# Patient Record
Sex: Female | Born: 1935
Health system: Southern US, Community
[De-identification: ages and names within clinical notes are randomized; demographics above are authoritative.]

## PROBLEM LIST (undated history)

## (undated) DIAGNOSIS — F32A Depression, unspecified: Secondary | ICD-10-CM

## (undated) DIAGNOSIS — F0280 Dementia in other diseases classified elsewhere without behavioral disturbance: Secondary | ICD-10-CM

## (undated) DIAGNOSIS — N183 Chronic kidney disease, stage 3 unspecified: Secondary | ICD-10-CM

## (undated) DIAGNOSIS — R5382 Chronic fatigue, unspecified: Secondary | ICD-10-CM

## (undated) DIAGNOSIS — I10 Essential (primary) hypertension: Secondary | ICD-10-CM

## (undated) DIAGNOSIS — H3530 Unspecified macular degeneration: Secondary | ICD-10-CM

## (undated) DIAGNOSIS — N2889 Other specified disorders of kidney and ureter: Secondary | ICD-10-CM

## (undated) DIAGNOSIS — D649 Anemia, unspecified: Secondary | ICD-10-CM

## (undated) DIAGNOSIS — F329 Major depressive disorder, single episode, unspecified: Secondary | ICD-10-CM

## (undated) DIAGNOSIS — E039 Hypothyroidism, unspecified: Secondary | ICD-10-CM

## (undated) HISTORY — DX: Chronic kidney disease, stage 3 (moderate): N18.3

## (undated) HISTORY — DX: Other specified disorders of kidney and ureter: N28.89

## (undated) HISTORY — DX: Anemia, unspecified: D64.9

## (undated) HISTORY — DX: Major depressive disorder, single episode, unspecified: F32.9

## (undated) HISTORY — DX: Unspecified macular degeneration: H35.30

## (undated) HISTORY — DX: Hypothyroidism, unspecified: E03.9

## (undated) HISTORY — DX: Dementia in other diseases classified elsewhere, unspecified severity, without behavioral disturbance, psychotic disturbance, mood disturbance, and anxiety: F02.80

## (undated) HISTORY — DX: Depression, unspecified: F32.A

## (undated) HISTORY — DX: Essential (primary) hypertension: I10

## (undated) HISTORY — DX: Chronic kidney disease, stage 3 unspecified: N18.30

## (undated) HISTORY — DX: Chronic fatigue, unspecified: R53.82

## (undated) HISTORY — PX: HERNIA REPAIR: SHX51

## (undated) HISTORY — PX: ABDOMINAL HYSTERECTOMY: SHX81

## (undated) HISTORY — PX: OOPHORECTOMY: SHX86

## (undated) HISTORY — PX: APPENDECTOMY: SHX54

## (undated) HISTORY — PX: OTHER SURGICAL HISTORY: SHX169

## (undated) HISTORY — PX: CHOLECYSTECTOMY: SHX55

---

## 1998-11-12 ENCOUNTER — Ambulatory Visit (HOSPITAL_COMMUNITY): Admission: RE | Admit: 1998-11-12 | Discharge: 1998-11-12 | Payer: Self-pay | Admitting: Gastroenterology

## 1998-11-12 ENCOUNTER — Encounter: Payer: Self-pay | Admitting: Gastroenterology

## 1999-10-15 ENCOUNTER — Encounter: Payer: Self-pay | Admitting: Internal Medicine

## 2000-03-25 ENCOUNTER — Ambulatory Visit (HOSPITAL_COMMUNITY): Admission: RE | Admit: 2000-03-25 | Discharge: 2000-03-25 | Payer: Self-pay | Admitting: Internal Medicine

## 2000-03-25 ENCOUNTER — Encounter: Payer: Self-pay | Admitting: Internal Medicine

## 2000-09-10 ENCOUNTER — Ambulatory Visit (HOSPITAL_COMMUNITY): Admission: RE | Admit: 2000-09-10 | Discharge: 2000-09-10 | Payer: Self-pay | Admitting: Gastroenterology

## 2000-09-10 ENCOUNTER — Encounter (INDEPENDENT_AMBULATORY_CARE_PROVIDER_SITE_OTHER): Payer: Self-pay | Admitting: Specialist

## 2001-03-28 ENCOUNTER — Encounter: Payer: Self-pay | Admitting: Internal Medicine

## 2001-03-28 ENCOUNTER — Ambulatory Visit (HOSPITAL_COMMUNITY): Admission: RE | Admit: 2001-03-28 | Discharge: 2001-03-28 | Payer: Self-pay | Admitting: Internal Medicine

## 2002-01-27 ENCOUNTER — Ambulatory Visit (HOSPITAL_COMMUNITY): Admission: RE | Admit: 2002-01-27 | Discharge: 2002-01-27 | Payer: Self-pay | Admitting: Gastroenterology

## 2002-01-27 ENCOUNTER — Encounter: Payer: Self-pay | Admitting: Gastroenterology

## 2002-09-13 ENCOUNTER — Ambulatory Visit (HOSPITAL_COMMUNITY): Admission: RE | Admit: 2002-09-13 | Discharge: 2002-09-13 | Payer: Self-pay | Admitting: Internal Medicine

## 2002-09-13 ENCOUNTER — Encounter: Payer: Self-pay | Admitting: Internal Medicine

## 2003-09-13 ENCOUNTER — Encounter: Payer: Self-pay | Admitting: Internal Medicine

## 2004-03-18 ENCOUNTER — Encounter: Payer: Self-pay | Admitting: Internal Medicine

## 2004-05-16 ENCOUNTER — Encounter: Admission: RE | Admit: 2004-05-16 | Discharge: 2004-05-16 | Payer: Self-pay | Admitting: Internal Medicine

## 2004-07-02 ENCOUNTER — Ambulatory Visit: Payer: Self-pay | Admitting: Internal Medicine

## 2004-09-25 ENCOUNTER — Ambulatory Visit: Payer: Self-pay | Admitting: Internal Medicine

## 2004-12-12 ENCOUNTER — Ambulatory Visit (HOSPITAL_COMMUNITY): Admission: RE | Admit: 2004-12-12 | Discharge: 2004-12-12 | Payer: Self-pay | Admitting: Internal Medicine

## 2005-01-06 ENCOUNTER — Encounter: Payer: Self-pay | Admitting: Internal Medicine

## 2005-01-06 ENCOUNTER — Ambulatory Visit (HOSPITAL_COMMUNITY): Admission: RE | Admit: 2005-01-06 | Discharge: 2005-01-06 | Payer: Self-pay | Admitting: Gastroenterology

## 2005-07-13 ENCOUNTER — Ambulatory Visit: Payer: Self-pay | Admitting: Internal Medicine

## 2005-08-21 ENCOUNTER — Ambulatory Visit: Payer: Self-pay | Admitting: Internal Medicine

## 2005-10-23 ENCOUNTER — Ambulatory Visit: Payer: Self-pay | Admitting: Internal Medicine

## 2005-10-30 ENCOUNTER — Ambulatory Visit: Payer: Self-pay | Admitting: Internal Medicine

## 2005-11-12 ENCOUNTER — Ambulatory Visit (HOSPITAL_BASED_OUTPATIENT_CLINIC_OR_DEPARTMENT_OTHER): Admission: RE | Admit: 2005-11-12 | Discharge: 2005-11-12 | Payer: Self-pay | Admitting: Internal Medicine

## 2005-12-03 ENCOUNTER — Ambulatory Visit: Payer: Self-pay | Admitting: Pulmonary Disease

## 2005-12-11 ENCOUNTER — Ambulatory Visit: Payer: Self-pay | Admitting: Internal Medicine

## 2006-01-05 ENCOUNTER — Ambulatory Visit: Payer: Self-pay | Admitting: Internal Medicine

## 2006-03-04 ENCOUNTER — Ambulatory Visit: Payer: Self-pay | Admitting: Internal Medicine

## 2006-04-05 ENCOUNTER — Ambulatory Visit: Payer: Self-pay | Admitting: Internal Medicine

## 2006-05-18 ENCOUNTER — Ambulatory Visit: Payer: Self-pay | Admitting: Internal Medicine

## 2006-06-18 ENCOUNTER — Ambulatory Visit: Payer: Self-pay | Admitting: Internal Medicine

## 2006-08-19 ENCOUNTER — Ambulatory Visit (HOSPITAL_COMMUNITY): Admission: RE | Admit: 2006-08-19 | Discharge: 2006-08-19 | Payer: Self-pay | Admitting: Internal Medicine

## 2006-08-19 ENCOUNTER — Encounter: Payer: Self-pay | Admitting: Internal Medicine

## 2006-12-03 ENCOUNTER — Ambulatory Visit: Payer: Self-pay | Admitting: Internal Medicine

## 2006-12-29 ENCOUNTER — Encounter: Admission: RE | Admit: 2006-12-29 | Discharge: 2006-12-29 | Payer: Self-pay | Admitting: Neurology

## 2007-02-07 ENCOUNTER — Ambulatory Visit: Payer: Self-pay | Admitting: Internal Medicine

## 2007-02-07 ENCOUNTER — Encounter: Payer: Self-pay | Admitting: Internal Medicine

## 2007-02-08 ENCOUNTER — Telehealth: Payer: Self-pay | Admitting: Internal Medicine

## 2007-04-04 ENCOUNTER — Ambulatory Visit: Payer: Self-pay | Admitting: Internal Medicine

## 2007-04-04 DIAGNOSIS — I10 Essential (primary) hypertension: Secondary | ICD-10-CM | POA: Insufficient documentation

## 2007-04-04 DIAGNOSIS — F4323 Adjustment disorder with mixed anxiety and depressed mood: Secondary | ICD-10-CM | POA: Insufficient documentation

## 2007-04-04 DIAGNOSIS — E039 Hypothyroidism, unspecified: Secondary | ICD-10-CM | POA: Insufficient documentation

## 2007-04-04 HISTORY — DX: Hypothyroidism, unspecified: E03.9

## 2007-04-04 HISTORY — DX: Essential (primary) hypertension: I10

## 2007-04-12 ENCOUNTER — Encounter: Payer: Self-pay | Admitting: Internal Medicine

## 2007-04-12 ENCOUNTER — Ambulatory Visit: Payer: Self-pay

## 2007-04-14 ENCOUNTER — Telehealth: Payer: Self-pay | Admitting: Internal Medicine

## 2007-06-07 ENCOUNTER — Telehealth: Payer: Self-pay | Admitting: Internal Medicine

## 2007-07-15 ENCOUNTER — Ambulatory Visit: Payer: Self-pay | Admitting: Internal Medicine

## 2007-07-17 LAB — CONVERTED CEMR LAB: TSH: 1.03 microintl units/mL (ref 0.35–5.50)

## 2007-08-08 ENCOUNTER — Encounter: Payer: Self-pay | Admitting: Internal Medicine

## 2007-08-18 ENCOUNTER — Ambulatory Visit: Payer: Self-pay | Admitting: Internal Medicine

## 2007-09-16 ENCOUNTER — Ambulatory Visit (HOSPITAL_COMMUNITY): Admission: RE | Admit: 2007-09-16 | Discharge: 2007-09-16 | Payer: Self-pay | Admitting: Internal Medicine

## 2007-09-26 ENCOUNTER — Telehealth: Payer: Self-pay | Admitting: Internal Medicine

## 2007-10-29 ENCOUNTER — Telehealth: Payer: Self-pay | Admitting: Internal Medicine

## 2007-10-31 ENCOUNTER — Telehealth: Payer: Self-pay | Admitting: Internal Medicine

## 2007-11-01 ENCOUNTER — Ambulatory Visit: Payer: Self-pay | Admitting: Internal Medicine

## 2007-12-12 ENCOUNTER — Ambulatory Visit: Payer: Self-pay | Admitting: Internal Medicine

## 2007-12-12 ENCOUNTER — Telehealth (INDEPENDENT_AMBULATORY_CARE_PROVIDER_SITE_OTHER): Payer: Self-pay | Admitting: *Deleted

## 2007-12-14 ENCOUNTER — Encounter: Payer: Self-pay | Admitting: Internal Medicine

## 2008-01-06 ENCOUNTER — Telehealth: Payer: Self-pay | Admitting: Internal Medicine

## 2008-01-24 ENCOUNTER — Encounter: Payer: Self-pay | Admitting: Internal Medicine

## 2008-01-26 ENCOUNTER — Encounter: Payer: Self-pay | Admitting: Internal Medicine

## 2008-01-26 ENCOUNTER — Encounter: Admission: RE | Admit: 2008-01-26 | Discharge: 2008-01-26 | Payer: Self-pay | Admitting: Gastroenterology

## 2008-02-07 ENCOUNTER — Encounter: Payer: Self-pay | Admitting: Internal Medicine

## 2008-02-15 ENCOUNTER — Telehealth: Payer: Self-pay | Admitting: Internal Medicine

## 2008-02-23 ENCOUNTER — Encounter: Payer: Self-pay | Admitting: Internal Medicine

## 2008-02-23 ENCOUNTER — Telehealth: Payer: Self-pay | Admitting: Internal Medicine

## 2008-02-27 ENCOUNTER — Encounter: Payer: Self-pay | Admitting: Internal Medicine

## 2008-02-28 ENCOUNTER — Telehealth: Payer: Self-pay | Admitting: Internal Medicine

## 2008-03-12 ENCOUNTER — Ambulatory Visit: Payer: Self-pay | Admitting: Internal Medicine

## 2008-03-12 DIAGNOSIS — D649 Anemia, unspecified: Secondary | ICD-10-CM | POA: Insufficient documentation

## 2008-03-13 LAB — CONVERTED CEMR LAB
Basophils Relative: 1.4 % (ref 0.0–3.0)
Ferritin: 44.5 ng/mL (ref 10.0–291.0)
Lymphocytes Relative: 22.2 % (ref 12.0–46.0)
MCHC: 34.1 g/dL (ref 30.0–36.0)
Neutro Abs: 4.4 10*3/uL (ref 1.4–7.7)
Neutrophils Relative %: 60.9 % (ref 43.0–77.0)
Platelets: 318 10*3/uL (ref 150–400)
RBC: 3.73 M/uL — ABNORMAL LOW (ref 3.87–5.11)
RDW: 12.2 % (ref 11.5–14.6)

## 2008-03-29 ENCOUNTER — Telehealth: Payer: Self-pay | Admitting: Internal Medicine

## 2008-04-19 ENCOUNTER — Telehealth (INDEPENDENT_AMBULATORY_CARE_PROVIDER_SITE_OTHER): Payer: Self-pay | Admitting: *Deleted

## 2008-04-19 ENCOUNTER — Telehealth: Payer: Self-pay | Admitting: Internal Medicine

## 2008-05-09 ENCOUNTER — Encounter: Admission: RE | Admit: 2008-05-09 | Discharge: 2008-05-09 | Payer: Self-pay | Admitting: Neurosurgery

## 2008-06-13 ENCOUNTER — Encounter: Admission: RE | Admit: 2008-06-13 | Discharge: 2008-06-13 | Payer: Self-pay | Admitting: Neurosurgery

## 2008-09-07 ENCOUNTER — Ambulatory Visit: Payer: Self-pay | Admitting: Internal Medicine

## 2008-09-10 LAB — CONVERTED CEMR LAB
ALT: 15 units/L (ref 0–35)
AST: 17 units/L (ref 0–37)
Alkaline Phosphatase: 66 units/L (ref 39–117)
Hemoglobin: 12.4 g/dL (ref 12.0–15.0)
Lymphocytes Relative: 22.3 % (ref 12.0–46.0)
Monocytes Relative: 8.7 % (ref 3.0–12.0)
Neutro Abs: 5.4 10*3/uL (ref 1.4–7.7)
Neutrophils Relative %: 60.6 % (ref 43.0–77.0)
RBC: 3.89 M/uL (ref 3.87–5.11)
RDW: 12.3 % (ref 11.5–14.6)
Total Bilirubin: 0.7 mg/dL (ref 0.3–1.2)

## 2008-09-13 ENCOUNTER — Encounter: Payer: Self-pay | Admitting: Internal Medicine

## 2008-09-18 ENCOUNTER — Telehealth: Payer: Self-pay | Admitting: Internal Medicine

## 2008-10-24 ENCOUNTER — Encounter: Payer: Self-pay | Admitting: Internal Medicine

## 2008-11-13 ENCOUNTER — Telehealth: Payer: Self-pay | Admitting: Internal Medicine

## 2008-12-04 ENCOUNTER — Inpatient Hospital Stay (HOSPITAL_COMMUNITY): Admission: RE | Admit: 2008-12-04 | Discharge: 2008-12-05 | Payer: Self-pay | Admitting: Neurosurgery

## 2009-01-11 ENCOUNTER — Ambulatory Visit: Payer: Self-pay | Admitting: Internal Medicine

## 2009-01-14 ENCOUNTER — Ambulatory Visit: Payer: Self-pay | Admitting: Internal Medicine

## 2009-01-14 DIAGNOSIS — Z8639 Personal history of other endocrine, nutritional and metabolic disease: Secondary | ICD-10-CM

## 2009-01-14 DIAGNOSIS — Z862 Personal history of diseases of the blood and blood-forming organs and certain disorders involving the immune mechanism: Secondary | ICD-10-CM | POA: Insufficient documentation

## 2009-01-15 LAB — CONVERTED CEMR LAB
ALT: 320 units/L — ABNORMAL HIGH (ref 0–35)
Total Bilirubin: 1.1 mg/dL (ref 0.3–1.2)

## 2009-01-16 ENCOUNTER — Encounter: Payer: Self-pay | Admitting: Internal Medicine

## 2009-01-24 ENCOUNTER — Encounter: Payer: Self-pay | Admitting: Gastroenterology

## 2009-01-24 ENCOUNTER — Telehealth (INDEPENDENT_AMBULATORY_CARE_PROVIDER_SITE_OTHER): Payer: Self-pay | Admitting: *Deleted

## 2009-02-06 ENCOUNTER — Telehealth: Payer: Self-pay | Admitting: Gastroenterology

## 2009-02-14 ENCOUNTER — Ambulatory Visit: Payer: Self-pay | Admitting: Gastroenterology

## 2009-02-14 ENCOUNTER — Ambulatory Visit (HOSPITAL_COMMUNITY): Admission: RE | Admit: 2009-02-14 | Discharge: 2009-02-14 | Payer: Self-pay | Admitting: Gastroenterology

## 2009-02-22 ENCOUNTER — Telehealth: Payer: Self-pay | Admitting: *Deleted

## 2009-05-24 ENCOUNTER — Ambulatory Visit: Payer: Self-pay | Admitting: Internal Medicine

## 2009-05-31 ENCOUNTER — Telehealth: Payer: Self-pay | Admitting: Internal Medicine

## 2009-06-05 ENCOUNTER — Ambulatory Visit: Payer: Self-pay | Admitting: Family Medicine

## 2009-06-05 LAB — CONVERTED CEMR LAB
GFR calc non Af Amer: 38.65 mL/min (ref >60)
Glucose, Bld: 107 mg/dL — ABNORMAL HIGH (ref 70–99)
Potassium: 4.5 meq/L (ref 3.5–5.1)
Sodium: 129 meq/L — ABNORMAL LOW (ref 135–145)

## 2009-06-06 LAB — CONVERTED CEMR LAB
ALT: 20 units/L (ref 0–35)
AST: 19 units/L (ref 0–37)
Basophils Absolute: 0.2 10*3/uL — ABNORMAL HIGH (ref 0.0–0.1)
Bilirubin, Direct: 0.1 mg/dL (ref 0.0–0.3)
Eosinophils Absolute: 0.7 10*3/uL — ABNORMAL HIGH (ref 0.0–0.6)
HCT: 34.5 % — ABNORMAL LOW (ref 36.0–46.0)
Hemoglobin: 11.6 g/dL — ABNORMAL LOW (ref 12.0–15.0)
Monocytes Absolute: 0.6 10*3/uL (ref 0.2–0.7)
RBC: 3.75 M/uL — ABNORMAL LOW (ref 3.87–5.11)
RDW: 12.1 % (ref 11.5–14.6)
Total Protein: 6.5 g/dL (ref 6.0–8.3)
WBC: 8.5 10*3/uL (ref 4.5–10.5)

## 2009-06-10 ENCOUNTER — Telehealth: Payer: Self-pay | Admitting: Internal Medicine

## 2009-06-20 ENCOUNTER — Encounter: Admission: RE | Admit: 2009-06-20 | Discharge: 2009-07-11 | Payer: Self-pay | Admitting: Internal Medicine

## 2009-06-25 ENCOUNTER — Encounter (INDEPENDENT_AMBULATORY_CARE_PROVIDER_SITE_OTHER): Payer: Self-pay | Admitting: *Deleted

## 2009-07-08 ENCOUNTER — Telehealth: Payer: Self-pay | Admitting: Internal Medicine

## 2009-07-24 ENCOUNTER — Ambulatory Visit (HOSPITAL_COMMUNITY): Admission: RE | Admit: 2009-07-24 | Discharge: 2009-07-24 | Payer: Self-pay | Admitting: Internal Medicine

## 2009-07-30 ENCOUNTER — Telehealth: Payer: Self-pay | Admitting: Internal Medicine

## 2009-08-07 ENCOUNTER — Encounter: Payer: Self-pay | Admitting: Internal Medicine

## 2009-08-07 ENCOUNTER — Encounter: Admission: RE | Admit: 2009-08-07 | Discharge: 2009-08-07 | Payer: Self-pay | Admitting: Gastroenterology

## 2009-08-10 HISTORY — PX: SPINE SURGERY: SHX786

## 2009-09-23 ENCOUNTER — Telehealth: Payer: Self-pay | Admitting: Internal Medicine

## 2009-12-09 ENCOUNTER — Encounter: Admission: RE | Admit: 2009-12-09 | Discharge: 2009-12-09 | Payer: Self-pay | Admitting: Neurosurgery

## 2010-01-22 ENCOUNTER — Encounter: Admission: RE | Admit: 2010-01-22 | Discharge: 2010-01-22 | Payer: Self-pay | Admitting: Neurosurgery

## 2010-03-16 ENCOUNTER — Ambulatory Visit: Payer: Self-pay | Admitting: Diagnostic Radiology

## 2010-03-16 ENCOUNTER — Encounter: Payer: Self-pay | Admitting: Internal Medicine

## 2010-03-16 ENCOUNTER — Emergency Department (HOSPITAL_BASED_OUTPATIENT_CLINIC_OR_DEPARTMENT_OTHER)
Admission: EM | Admit: 2010-03-16 | Discharge: 2010-03-16 | Payer: Self-pay | Source: Home / Self Care | Admitting: Emergency Medicine

## 2010-03-18 ENCOUNTER — Ambulatory Visit: Payer: Self-pay | Admitting: Family Medicine

## 2010-03-18 DIAGNOSIS — M542 Cervicalgia: Secondary | ICD-10-CM | POA: Insufficient documentation

## 2010-03-27 ENCOUNTER — Telehealth: Payer: Self-pay | Admitting: Family Medicine

## 2010-04-01 ENCOUNTER — Encounter: Payer: Self-pay | Admitting: Internal Medicine

## 2010-04-08 ENCOUNTER — Encounter: Payer: Self-pay | Admitting: Internal Medicine

## 2010-04-13 ENCOUNTER — Encounter: Admission: RE | Admit: 2010-04-13 | Discharge: 2010-04-13 | Payer: Self-pay | Admitting: Neurosurgery

## 2010-04-16 ENCOUNTER — Telehealth: Payer: Self-pay | Admitting: Internal Medicine

## 2010-04-25 ENCOUNTER — Telehealth: Payer: Self-pay | Admitting: Internal Medicine

## 2010-04-28 ENCOUNTER — Encounter: Payer: Self-pay | Admitting: Internal Medicine

## 2010-05-06 ENCOUNTER — Ambulatory Visit: Payer: Self-pay | Admitting: Internal Medicine

## 2010-05-06 DIAGNOSIS — D638 Anemia in other chronic diseases classified elsewhere: Secondary | ICD-10-CM | POA: Insufficient documentation

## 2010-05-12 LAB — CONVERTED CEMR LAB
Basophils Relative: 1.5 % (ref 0.0–3.0)
Bilirubin, Direct: 0.1 mg/dL (ref 0.0–0.3)
Eosinophils Relative: 4.3 % (ref 0.0–5.0)
GFR calc non Af Amer: 47.9 mL/min (ref >60)
Glucose, Bld: 91 mg/dL (ref 70–99)
HCT: 34.1 % — ABNORMAL LOW (ref 36.0–46.0)
Hemoglobin: 11.6 g/dL — ABNORMAL LOW (ref 12.0–15.0)
Iron: 76 ug/dL (ref 42–145)
Lymphs Abs: 1.4 10*3/uL (ref 0.7–4.0)
Monocytes Relative: 12.6 % — ABNORMAL HIGH (ref 3.0–12.0)
Neutro Abs: 2.7 10*3/uL (ref 1.4–7.7)
Potassium: 4.4 meq/L (ref 3.5–5.1)
RDW: 13.7 % (ref 11.5–14.6)
Saturation Ratios: 24.7 % (ref 20.0–50.0)
Sodium: 140 meq/L (ref 135–145)
Total Bilirubin: 0.6 mg/dL (ref 0.3–1.2)
Transferrin: 220 mg/dL (ref 212.0–360.0)
WBC: 5 10*3/uL (ref 4.5–10.5)

## 2010-05-13 ENCOUNTER — Ambulatory Visit: Payer: Self-pay | Admitting: Internal Medicine

## 2010-05-14 LAB — CONVERTED CEMR LAB
CO2: 26 meq/L (ref 19–32)
Glucose, Bld: 98 mg/dL (ref 70–99)
Iron: 79 ug/dL (ref 42–145)
Potassium: 4 meq/L (ref 3.5–5.1)
Saturation Ratios: 26.4 % (ref 20.0–50.0)
Sodium: 142 meq/L (ref 135–145)
Transferrin: 213.6 mg/dL (ref 212.0–360.0)

## 2010-05-15 ENCOUNTER — Telehealth (INDEPENDENT_AMBULATORY_CARE_PROVIDER_SITE_OTHER): Payer: Self-pay | Admitting: *Deleted

## 2010-05-22 ENCOUNTER — Inpatient Hospital Stay (HOSPITAL_COMMUNITY): Admission: RE | Admit: 2010-05-22 | Discharge: 2010-05-23 | Payer: Self-pay | Admitting: Neurosurgery

## 2010-05-22 ENCOUNTER — Ambulatory Visit: Payer: Self-pay | Admitting: Internal Medicine

## 2010-05-23 LAB — CONVERTED CEMR LAB
OCCULT 1: NEGATIVE
OCCULT 2: NEGATIVE

## 2010-06-11 ENCOUNTER — Encounter: Payer: Self-pay | Admitting: Internal Medicine

## 2010-07-28 ENCOUNTER — Encounter: Payer: Self-pay | Admitting: Internal Medicine

## 2010-08-05 ENCOUNTER — Encounter
Admission: RE | Admit: 2010-08-05 | Discharge: 2010-08-05 | Payer: Self-pay | Source: Home / Self Care | Attending: Neurosurgery | Admitting: Neurosurgery

## 2010-08-10 HISTORY — PX: BACK SURGERY: SHX140

## 2010-08-10 HISTORY — PX: ORIF TIBIA FRACTURE: SHX5416

## 2010-08-13 ENCOUNTER — Encounter: Payer: Self-pay | Admitting: Internal Medicine

## 2010-08-29 ENCOUNTER — Ambulatory Visit
Admission: RE | Admit: 2010-08-29 | Discharge: 2010-08-29 | Payer: Self-pay | Source: Home / Self Care | Attending: Family Medicine | Admitting: Family Medicine

## 2010-08-29 DIAGNOSIS — N8112 Cystocele, lateral: Secondary | ICD-10-CM | POA: Insufficient documentation

## 2010-08-31 ENCOUNTER — Encounter: Payer: Self-pay | Admitting: Neurosurgery

## 2010-09-01 ENCOUNTER — Encounter: Payer: Self-pay | Admitting: Internal Medicine

## 2010-09-07 LAB — CONVERTED CEMR LAB
ALT: 973 units/L — ABNORMAL HIGH (ref 0–35)
Albumin: 3.6 g/dL (ref 3.5–5.2)
Amylase: 46 units/L (ref 27–131)
BUN: 17 mg/dL (ref 6–23)
BUN: 19 mg/dL (ref 6–23)
Basophils Relative: 0 % (ref 0.0–3.0)
CO2: 28 meq/L (ref 19–32)
Calcium: 9 mg/dL (ref 8.4–10.5)
Calcium: 9.6 mg/dL (ref 8.4–10.5)
Chloride: 105 meq/L (ref 96–112)
Creatinine, Ser: 1.1 mg/dL (ref 0.4–1.2)
Creatinine, Ser: 1.3 mg/dL — ABNORMAL HIGH (ref 0.4–1.2)
Eosinophils Relative: 0.5 % (ref 0.0–5.0)
GFR calc Af Amer: 62 mL/min
GFR calc non Af Amer: 42.14 mL/min (ref >60)
GFR calc non Af Amer: 51 mL/min
Glucose, Bld: 93 mg/dL (ref 70–99)
Glucose, Bld: 99 mg/dL (ref 70–99)
Hemoglobin: 11.3 g/dL — ABNORMAL LOW (ref 12.0–15.0)
Lymphs Abs: 1.4 10*3/uL (ref 0.7–4.0)
MCHC: 34.7 g/dL (ref 30.0–36.0)
Monocytes Absolute: 0.8 10*3/uL (ref 0.1–1.0)
Neutro Abs: 7 10*3/uL (ref 1.4–7.7)
Potassium: 4.1 meq/L (ref 3.5–5.1)
RDW: 12 % (ref 11.5–14.6)
Sodium: 140 meq/L (ref 135–145)
Sodium: 140 meq/L (ref 135–145)
TSH: 0.04 microintl units/mL — ABNORMAL LOW (ref 0.35–5.50)
Total Protein: 6.5 g/dL (ref 6.0–8.3)

## 2010-09-08 LAB — SURGICAL PCR SCREEN: MRSA, PCR: NEGATIVE

## 2010-09-08 LAB — CBC
Hemoglobin: 11.9 g/dL — ABNORMAL LOW (ref 12.0–15.0)
MCH: 29.8 pg (ref 26.0–34.0)
MCHC: 32.5 g/dL (ref 30.0–36.0)
MCV: 91.5 fL (ref 78.0–100.0)
Platelets: 292 10*3/uL (ref 150–400)

## 2010-09-08 LAB — BASIC METABOLIC PANEL
BUN: 19 mg/dL (ref 6–23)
CO2: 26 mEq/L (ref 19–32)
Calcium: 9.1 mg/dL (ref 8.4–10.5)
Creatinine, Ser: 1.35 mg/dL — ABNORMAL HIGH (ref 0.4–1.2)
GFR calc Af Amer: 46 mL/min — ABNORMAL LOW (ref >60)

## 2010-09-11 ENCOUNTER — Ambulatory Visit (HOSPITAL_COMMUNITY)
Admission: RE | Admit: 2010-09-11 | Discharge: 2010-09-12 | Disposition: A | Discharge status: Home / Self Care | Payer: Medicare Other | Attending: Neurosurgery | Admitting: Neurosurgery

## 2010-09-11 ENCOUNTER — Inpatient Hospital Stay (HOSPITAL_COMMUNITY): Payer: Medicare Other

## 2010-09-11 DIAGNOSIS — Z79899 Other long term (current) drug therapy: Secondary | ICD-10-CM | POA: Insufficient documentation

## 2010-09-11 DIAGNOSIS — M5126 Other intervertebral disc displacement, lumbar region: Secondary | ICD-10-CM | POA: Insufficient documentation

## 2010-09-11 DIAGNOSIS — M5137 Other intervertebral disc degeneration, lumbosacral region: Secondary | ICD-10-CM | POA: Insufficient documentation

## 2010-09-11 DIAGNOSIS — Z01812 Encounter for preprocedural laboratory examination: Secondary | ICD-10-CM | POA: Insufficient documentation

## 2010-09-11 DIAGNOSIS — M51379 Other intervertebral disc degeneration, lumbosacral region without mention of lumbar back pain or lower extremity pain: Secondary | ICD-10-CM | POA: Insufficient documentation

## 2010-09-11 DIAGNOSIS — M48061 Spinal stenosis, lumbar region without neurogenic claudication: Secondary | ICD-10-CM | POA: Insufficient documentation

## 2010-09-11 NOTE — Letter (Signed)
Summary: [facility]  [facility]   Imported By: Maryln Gottron 05/15/2010 12:25:50  _____________________________________________________________________  External Attachment:    Type:   Image     Comment:   External Document

## 2010-09-11 NOTE — Progress Notes (Signed)
Summary: refill sent  Phone Note Refill Request Message from:  Fax from Pharmacy on April 25, 2010 4:24 PM  Refills Requested: Medication #1:  HYDROCODONE-ACETAMINOPHEN 5-325 MG TABS one q 6 hrs prn Initial call taken by: Kern Reap CMA Duncan Dull),  April 25, 2010 4:24 PM    Prescriptions: HYDROCODONE-ACETAMINOPHEN 5-325 MG TABS (HYDROCODONE-ACETAMINOPHEN) one q 6 hrs prn  #60 x 0   Entered by:   Kern Reap CMA (AAMA)   Authorized by:   Birdie Sons MD   Signed by:   Kern Reap CMA (AAMA) on 04/25/2010   Method used:   Telephoned to ...       [facility] [phone]* (retail)       [address]       [place], [place]  [address]       Ph: [phone]       Fax: [phone]   RxID:   [id number]

## 2010-09-11 NOTE — Consult Note (Signed)
Summary: [facility]  [facility]   Imported By: Maryln Gottron 08/22/2009 14:15:47  _____________________________________________________________________  External Attachment:    Type:   Image     Comment:   External Document

## 2010-09-11 NOTE — Assessment & Plan Note (Signed)
Summary: prolapsed bladder?/dm   Vital Signs:  Patient profile:   77 year old female Temp:     98.6 degrees F oral BP sitting:   160 / 80  (left arm) Cuff size:   regular  Vitals Entered By: Sid Falcon LPN (August 29, 2010 3:56 PM)  History of Present Illness: Shower this AM and noted mass in vagina area. No pain.  No bleeding.  No appetite or weight changes. No real incontinence problems.    no recent dysuria. Prior history of hysterectomy with bilateral oophorectomy No recent change of bowel habits  Allergies: 1)  Oxycodone Hcl (Oxycodone Hcl)  Past History:  Past Medical History: Last updated: 03/12/2008 chronic fatigue back pain Depression Hypertension Hypothyroidism Anemia-NOS  Past Surgical History: Last updated: 01/11/2009 Appendectomy Cholecystectomy Inguinal herniorrhaphy Hysterectomy Oophorectomy cervical spine---spacer  Family History: Last updated: 04/05/07 father deceased AAA-age 40 mother deceased renal cell ca age 20  Social History: Last updated: April 05, 2007 Single Former Smoker Alcohol use-no Regular exercise-no  Risk Factors: Exercise: no (2007/04/05)  Risk Factors: Smoking Status: quit > 6 months (05/24/2009) PMH-FH-SH reviewed for relevance  Review of Systems  The patient denies anorexia, fever, weight loss, chest pain, abdominal pain, melena, hematochezia, severe indigestion/heartburn, hematuria, incontinence, genital sores, muscle weakness, suspicious skin lesions, and enlarged lymph nodes.    Physical Exam  General:  Well-developed,well-nourished,in no acute distress; alert,appropriate and cooperative throughout examination Mouth:  Oral mucosa and oropharynx without lesions or exudates.  Teeth in good repair. Neck:  No deformities, masses, or tenderness noted. Lungs:  Normal respiratory effort, chest expands symmetrically. Lungs are clear to auscultation, no crackles or wheezes. Heart:  normal rate and regular rhythm.    Genitalia:  patient has some atrophic vaginal mucosal changes. She has cystocele which is obvious with cough or strain. Bimanual exam reveals no masses otherwise. Cystocele not prolapsing outside vagina Skin:  no rashes and no suspicious lesions.     Impression & Recommendations:  Problem # 1:  CYSTOCELE WITHOUT MENTION UTERINE PROLAPSE LAT (ICD-618.02) At this point is asymptomatic and she is not interested in treatment. She will try some Kegel exercises. Mentioned pessary and surgical options and not wishing to pursue at this time.  Complete Medication List: 1)  Proair Hfa 108 (90 Base) Mcg/act Aers (Albuterol sulfate) .... 2 puffs two times a day as needed 2)  Levothroid 125 Mcg Tabs (Levothyroxine sodium) .... One by mouth daily 3)  Micardis Hct 80-12.5 Mg Tabs (Telmisartan-hctz) .... Take1 tablet by mouth once a day 4)  Nexium 40 Mg Cpdr (Esomeprazole magnesium) .... Take 1 capsule by mouth once a day 5)  Amlodipine Besylate 5 Mg Tabs (Amlodipine besylate) .... 1/2 by mouth every day 6)  Hydrocodone-acetaminophen 7.5-325 Mg Tabs (Hydrocodone-acetaminophen) .... One by mouth every 6 hours  Patient Instructions: 1)  You have a cystocele or bladder prolapse.  This should only be treated if causing symptoms.   Orders Added: 1)  Est. Patient Level III [29562]

## 2010-09-11 NOTE — Letter (Signed)
Summary: [facility]  [facility]   Imported By: Maryln Gottron 07/22/2010 11:22:21  _____________________________________________________________________  External Attachment:    Type:   Image     Comment:   External Document

## 2010-09-11 NOTE — Letter (Signed)
Summary: [facility]-Another Note  [facility]-Another Note   Imported By: Maryln Gottron 05/21/2010 11:29:34  _____________________________________________________________________  External Attachment:    Type:   Image     Comment:   External Document

## 2010-09-11 NOTE — Assessment & Plan Note (Signed)
Summary: med check and refill/cjr   Vital Signs:  Patient profile:   77 year old female Weight:      177 pounds Temp:     98.6 degrees F oral Pulse rate:   72 / minute Pulse rhythm:   regular Resp:     16 per minute BP sitting:   136 / 72  Vitals Entered By: Lynann [place] CMA (May 06, 2010 8:33 AM) CC: rov Is Patient Diabetic? No Pain Assessment Patient in pain? no        CC:  rov.  History of Present Illness:  Follow-Up Visit: pt here with dtr      This is a 77 year old woman who presents for Follow-up visit.  The patient denies chest pain and palpitations.  Since the last visit the patient notes no new problems or concerns.  The patient reports taking meds as prescribed.  When questioned about possible medication side effects, the patient notes none.  Ha continued insomnia---uses hydrododone for sleep. Started this after rupturing a disc in back and rotator cuff tear. She has been told that she will need surgery (Dr. Venetia Maxon).   chronic fatigue, chronic insomnia. All other systems reviewed and were negative   Current Problems (verified): 1)  Neck Pain  (ICD-723.1) 2)  Nonspecific Abn Finding Rad & Oth Exam Gi Tract  (ICD-793.4) 3)  Liver Function Tests, Abnormal, Hx of  (ICD-V12.2) 4)  Anemia-nos  (ICD-285.9) 5)  Hypothyroidism  (ICD-244.9) 6)  Hypertension  (ICD-401.9) 7)  Depression  (ICD-311)  Current Medications (verified): 1)  Proair Hfa 108 (90 Base) Mcg/act  Aers (Albuterol Sulfate) .... 2 Puffs Two Times A Day As Needed 2)  Levothroid 125 Mcg  Tabs (Levothyroxine Sodium) .... One By Mouth Daily 3)  Micardis Hct 80-12.5 Mg Tabs (Telmisartan-Hctz) .... Take1 Tablet By Mouth Once A Day 4)  Nexium 40 Mg Cpdr (Esomeprazole Magnesium) .... Take 1 Capsule By Mouth Once A Day 5)  Amlodipine Besylate 5 Mg  Tabs (Amlodipine Besylate) .... 1/2 By Mouth Every Day 6)  Hydrocodone-Acetaminophen 5-500 Mg Tabs (Hydrocodone-Acetaminophen) .... One By Mouth Q 6  Hours  Allergies (verified): 1)  Oxycodone Hcl (Oxycodone Hcl)  Past History:  Past Medical History: Last updated: 03/12/2008 chronic fatigue back pain Depression Hypertension Hypothyroidism Anemia-NOS  Past Surgical History: Last updated: 01/11/2009 Appendectomy Cholecystectomy Inguinal herniorrhaphy Hysterectomy Oophorectomy cervical spine---spacer  Family History: Last updated: 2007-05-03 father deceased AAA-age 45 mother deceased renal cell ca age 57  Social History: Last updated: 05/03/2007 Single Former Smoker Alcohol use-no Regular exercise-no  Risk Factors: Exercise: no (05-03-07)  Risk Factors: Smoking Status: quit > 6 months (05/24/2009)   Impression & Recommendations:  Problem # 1:  HYPONATREMIA (ICD-276.1)  recheck labs  Orders: Venipuncture (95621) TLB-BMP (Basic Metabolic Panel-BMET) (80048-METABOL)  Problem # 2:  ANEMIA CHRONIC DISEASE OTHER (ICD-285.29)  needs f/u  Orders: TLB-CBC Platelet - w/Differential (85025-CBCD) TLB-B12 + Folate Pnl (30865_78469-G29/BMW) TLB-Ferritin (82728-FER) TLB-IBC Pnl (Iron/FE;Transferrin) (83550-IBC)  Problem # 3:  HYPERTENSION (ICD-401.9)  Her updated medication list for this problem includes:    Micardis Hct 80-12.5 Mg Tabs (Telmisartan-hctz) .Marland Kitchen... Take1 tablet by mouth once a day    Amlodipine Besylate 5 Mg Tabs (Amlodipine besylate) .Marland Kitchen... 1/2 by mouth every day  BP today: 136/72 Prior BP: 140/70 (03/18/2010)  Labs Reviewed: K+: 4.5 (06/05/2009) Creat: : 1.4 (06/05/2009)     Problem # 4:  HYPOTHYROIDISM (ICD-244.9)  check labs today Her updated medication list for this problem includes:  Levothroid 125 Mcg Tabs (Levothyroxine sodium) ..... One by mouth daily  Orders: TLB-TSH (Thyroid Stimulating Hormone) (84443-TSH)  Complete Medication List: 1)  Proair Hfa 108 (90 Base) Mcg/act Aers (Albuterol sulfate) .... 2 puffs two times a day as needed 2)  Levothroid 125 Mcg Tabs  (Levothyroxine sodium) .... One by mouth daily 3)  Micardis Hct 80-12.5 Mg Tabs (Telmisartan-hctz) .... Take1 tablet by mouth once a day 4)  Nexium 40 Mg Cpdr (Esomeprazole magnesium) .... Take 1 capsule by mouth once a day 5)  Amlodipine Besylate 5 Mg Tabs (Amlodipine besylate) .... 1/2 by mouth every day 6)  Hydrocodone-acetaminophen 5-500 Mg Tabs (Hydrocodone-acetaminophen) .... One by mouth q 6 hours  Other Orders: TLB-Hepatic/Liver Function Pnl (80076-HEPATIC)  Appended Document: Orders Update     Clinical Lists Changes  Orders: Added new Service order of Specimen Handling (16109) - Signed      Appended Document: med check and refill/cjr  Flu Vaccine Consent Questions     Do you have a history of severe allergic reactions to this vaccine? no    Any prior history of allergic reactions to egg and/or gelatin? no    Do you have a sensitivity to the preservative Thimersol? no    Do you have a past history of Guillan-Barre Syndrome? no    Do you currently have an acute febrile illness? no    Have you ever had a severe reaction to latex? no    Vaccine information given and explained to patient? yes    Are you currently pregnant? no    Lot Number:[id number]   Exp Date:02/07/2011   Site Given  Left Deltoid IM    Clinical Lists Changes  Medications: Rx of LEVOTHROID 125 MCG  TABS (LEVOTHYROXINE SODIUM) one by mouth daily;  #90 x 3;  Signed;  Entered by: Lynann [place] CMA;  Authorized by: Birdie Sons MD;  Method used: Faxed to [facility], [address], [place], [place]  [address], Ph: [phone], Fax: [phone] Rx of MICARDIS HCT 80-12.5 MG TABS (TELMISARTAN-HCTZ) Take1 tablet by mouth once a day;  #100 x 1;  Signed;  Entered by: Lynann [place] CMA;  Authorized by: Birdie Sons MD;  Method used: Faxed to [facility], [address], [place], [place]  [address], Ph: [phone], Fax: [phone] Rx of AMLODIPINE BESYLATE 5 MG   TABS (AMLODIPINE BESYLATE) 1/2 by mouth every day;  #90 x 3;  Signed;  Entered by: Lynann [place] CMA;  Authorized by: Birdie Sons MD;  Method used: Faxed to [facility], [address], [place], [place]  [address], Ph: [phone], Fax: [phone] Orders: Added new Service order of Flu Vaccine 55yrs + MEDICARE PATIENTS (J1884) - Signed Added new Service order of Administration Flu vaccine - MCR (Z6606) - Signed Observations: Added new observation of FLU VAX VIS: 03/04/2010 version (05/06/2010 9:05) Added new observation of FLU VAXLOT: [id number] (05/06/2010 9:05) Added new observation of FLU VAXMFR: [employer] (05/06/2010 9:05) Added new observation of FLU VAX EXP: 02/07/2011 (05/06/2010 9:05) Added new observation of FLU VAX DSE: 0.69ml (05/06/2010 9:05) Added new observation of FLU VAX: Fluvax 3+ (05/06/2010 9:05)    Prescriptions: AMLODIPINE BESYLATE 5 MG  TABS (AMLODIPINE BESYLATE) 1/2 by mouth every day  #90 x 3   Entered by:   Lynann Beaver CMA   Authorized by:   Birdie Sons MD   Signed by:   Lynann Beaver CMA on 05/06/2010   Method used:   Faxed to [facility]  [facility] (mail-order)       [address]       [place], [place]  [address]       Ph: [phone]       Fax: [phone]   RxID:   [id number] MICARDIS HCT 80-12.5 MG TABS (TELMISARTAN-HCTZ) Take1 tablet by mouth once a day  #100 x 1   Entered by:   Lynann Beaver CMA   Authorized by:   Birdie Sons MD   Signed by:   Lynann Be[place] CMA on 05/06/2010   Method used:   Faxed to ...       [facility] (mail-order)       [address]       [place], [place]  [address]       Ph: [phone]       Fax: [phone]   RxID:   [id number] LEVOTHROID 125 MCG  TABS (LEVOTHYROXINE SODIUM) one by mouth daily  #90 x 3   Entered by:   Lynann [place] CMA   Authorized by:   Birdie Sons MD   Signed by:   Lynann Beaver CMA on 05/06/2010   Method used:    Faxed to ...       [facility] (mail-order)       [address]       [place], [place]  [address]       Ph: [phone]       Fax: [phone]   RxID:   [id number]

## 2010-09-11 NOTE — Progress Notes (Signed)
Summary: Ambien question  Phone Note Call from Patient Call back at Home Phone [phone]   Summary of Call: Patient called with questions about her Ambien. Patient would like to know why Zolpidem was filled and not Ambien.  **Called to discuss and was unable to reach the patient, line busy. Initial call taken by: Lucious Groves CMA,  April 16, 2010 2:03 PM  Follow-up for Phone Call        Tried to reach patient again, still busy. Lucious Groves CMA  April 16, 2010 4:08 PM   Patient states that she does need the Ambien CR (BMN), I made patient aware that both generic and brand name requires prior authorization. Patient is aware that she cannot send back the Zolpidem she received, and she cannot get Ambien until time for refill. Patient expressed understanding and will call [facility] to discuss the cost of brand name Ambien. Lucious Groves CMA  April 17, 2010 2:31 PM     New/Updated Medications: AMBIEN CR 12.5 MG CR-TABS (ZOLPIDEM TARTRATE) Take 1 tablet by mouth at bedtime

## 2010-09-11 NOTE — Progress Notes (Signed)
  Faxed lab results from 05-13-10 and 05-06-10 to [facility] Short Stay attn. Jabier Mutton to fax number [phone].

## 2010-09-11 NOTE — Letter (Signed)
Summary: [facility]  [facility]   Imported By: Maryln Gottron 08/15/2010 15:35:41  _____________________________________________________________________  External Attachment:    Type:   Image     Comment:   External Document

## 2010-09-11 NOTE — Letter (Signed)
Summary: [facility]   Imported By: Maryln Gottron 03/19/2010 15:40:27  _____________________________________________________________________  External Attachment:    Type:   Image     Comment:   External Document

## 2010-09-11 NOTE — Letter (Signed)
Summary: [facility]  [facility]   Imported By: Maryln Gottron 05/15/2010 12:24:38  _____________________________________________________________________  External Attachment:    Type:   Image     Comment:   External Document

## 2010-09-11 NOTE — Progress Notes (Signed)
Summary: arm pain  Phone Note Call from Patient   Caller: Patient Call For: Birdie Sons MD Summary of Call: Pt is calling for RX for severe arm and shoulder pain.  Pain is worse than on her office visit. [phone] Initial call taken by: Lynann [place] CMA,  March 27, 2010 1:08 PM  Follow-up for Phone Call        Has pt seen neurosurgeion yet?  May refill hydrocodone 5-325 mg 1-2 by mouth q 6 hours as needed pain #60 with no refill. Follow-up by: Evelena Peat MD,  March 27, 2010 1:13 PM  Additional Follow-up for Phone Call Additional follow up Details #1::        [facility] Additional Follow-up by: Lynann [place] CMA,  March 27, 2010 1:59 PM

## 2010-09-11 NOTE — Assessment & Plan Note (Signed)
Summary: neck and arm pain/dm   Vital Signs:  Patient profile:   77 year old female Temp:     97.7 degrees F oral BP sitting:   140 / 70  (left arm) Cuff size:   regular  Vitals Entered By: Sid Falcon LPN (March 18, 2010 11:55 AM)  History of Present Illness: Patient is seen same day appointment right upper back, neck, and right upper extremity pain nine days' duration. She has a long history of osteoarthritis and has seen neurosurgeon previously with prior cervical spine surgery.  Current symptoms started about 9 days ago after helping load a mattress. She initially had some mild pain and then this past Saturday after lifting a cake out of the refrigerator noticed severe worsening of pain. She describes deep achy quality pain which is constant which radiates lower cervical region all the way down to the hand. Questionable weakness. No numbness. Went to emergency room over in [facility] over the weekend x-rays right shoulder revealed mild osteoarthritis but no acute abnormality. Thoracic spine films revealed no acute bony abnormality. Patient given Dilaudid and Toradol minimal relief. Prescribed hydrocodone 7.5 mg with minimal relief.  She rates pain 7-8/10 severity. Difficulty sleeping secondary to pain. No real alleviating factors. Exacerbated by movement.  Allergies: 1)  Oxycodone Hcl (Oxycodone Hcl)  Past History:  Past Medical History: Last updated: 03/12/2008 chronic fatigue back pain Depression Hypertension Hypothyroidism Anemia-NOS  Past Surgical History: Last updated: 01/11/2009 Appendectomy Cholecystectomy Inguinal herniorrhaphy Hysterectomy Oophorectomy cervical spine---spacer  Family History: Last updated: 2007/04/24 father deceased AAA-age 38 mother deceased renal cell ca age 23  Social History: Last updated: 04/24/2007 Single Former Smoker Alcohol use-no Regular exercise-no  Risk Factors: Exercise: no (04/24/07)  Risk Factors: Smoking  Status: quit > 6 months (05/24/2009) PMH-FH-SH reviewed for relevance  Review of Systems  The patient denies anorexia, fever, weight loss, chest pain, dyspnea on exertion, peripheral edema, prolonged cough, headaches, and muscle weakness.    Physical Exam  General:  Well-developed,well-nourished,in no acute distress; alert,appropriate and cooperative throughout examination Head:  Normocephalic and atraumatic without obvious abnormalities. No apparent alopecia or balding. Neck:  patient has some tenderness around C7 region. She also has some nonspecific tenderness right trapezius area Lungs:  Normal respiratory effort, chest expands symmetrically. Lungs are clear to auscultation, no crackles or wheezes. Heart:  normal rate and regular rhythm.   Extremities:  no edema. Neurologic:  patient has question of some mild weakness right upper extremity but difficult to sort out from decreased effort secondary to pain. Deep tendon reflexes are 2+ throughout upper extremities. Sensory function intact to touch   Impression & Recommendations:  Problem # 1:  NECK PAIN (ICD-723.1) Assessment New  patient presents with radiculopathy symptoms. Poorly controlled with pain medication. We'll get back in to see her neurosurgeon and start taper of prednisone and continue hydrocodone for pain relief. Reviewed possible side effects. Her updated medication list for this problem includes:    Celebrex 200 Mg Caps (Celecoxib) ..... Qd as needed    Hydrocodone-acetaminophen 5-325 Mg Tabs (Hydrocodone-acetaminophen) .Marland Kitchen... Prn  Orders: Neurosurgeon Referral Psychologist, educational) Prescription Created Electronically [phone])  Complete Medication List: 1)  Proair Hfa 108 (90 Base) Mcg/act Aers (Albuterol sulfate) .... 2 puffs two times a day as needed 2)  Levothroid 125 Mcg Tabs (Levothyroxine sodium) .... One by mouth daily 3)  Micardis Hct 80-12.5 Mg Tabs (Telmisartan-hctz) .... Take1 tablet by mouth once a day 4)   Nexium 40 Mg Cpdr (Esomeprazole magnesium) .Marland KitchenMarland KitchenMarland Kitchen  Take 1 capsule by mouth once a day 5)  Celebrex 200 Mg Caps (Celecoxib) .... Qd as needed 6)  Amlodipine Besylate 5 Mg Tabs (Amlodipine besylate) .... 1/2 by mouth every day 7)  Align Caps (Misc intestinal flora regulat) .... Take 1 tablet by mouth once a day 8)  Hydrocodone-acetaminophen 5-325 Mg Tabs (Hydrocodone-acetaminophen) .... Prn 9)  Imipramine Hcl 10 Mg Tabs (Imipramine hcl) .Marland Kitchen.. 1 by mouth nightly as needed for restless leg syndrome 10)  Dulera 100-5 Mcg/act Aero (Mometasone furo-formoterol fum) .... 2 puffs once daily 11)  Prednisone 10 Mg Tabs (Prednisone) .... Taper as follows: 4-4-4-3-3-2-2-1-1  Patient Instructions: 1)  Follow up promptly if he notices any increased upper extremity weakness or progressive pains 2)  Continue hydrocodone for pain relief Prescriptions: PREDNISONE 10 MG TABS (PREDNISONE) taper as follows: 4-4-4-3-3-2-2-1-1  #24 x 0   Entered and Authorized by:   Evelena Peat MD   Signed by:   Evelena Peat MD on 03/18/2010   Method used:   [facility] to        [facility] [phone]* (retail)       [address]       [place], [place]  [address]       Ph: [phone]       Fax: [phone]   RxID:   [id number]

## 2010-09-11 NOTE — Medication Information (Signed)
Summary: Ambien Approved  Ambien Approved   Imported By: Maryln Gottron 04/09/2010 10:35:29  _____________________________________________________________________  External Attachment:    Type:   Image     Comment:   External Document

## 2010-09-11 NOTE — Progress Notes (Signed)
Summary: Dr. Adriana Simas office - Britta Mccreedy - Pt cancelled their ov  Phone Note From Other Clinic Call back at [phone] [facility]: Dr. Adriana Simas office - Britta Mccreedy Summary of Call: Dr. Adriana Simas office called and said that the pt cancelled their appt for today with Dr. Ewing Schlein today.  Wanted to make Dr. Cato Mulligan aware. Pt said that they were feeling better and did not need to come in.  Initial call taken by: Lucy Antigua,  September 23, 2009 11:42 AM

## 2010-09-17 NOTE — Letter (Signed)
Summary: [facility]  [facility]   Imported By: Maryln Gottron 09/10/2010 15:54:34  _____________________________________________________________________  External Attachment:    Type:   Image     Comment:   External Document

## 2010-09-19 NOTE — Op Note (Signed)
NAMERAVEN, FURNAS                  ACCOUNT NO.:  [id number]  MEDICAL RECORD NO.:  [id number]           PATIENT TYPE:  I  LOCATION:  3007                         FACILITY:  [facility]  PHYSICIAN:  Danae Orleans. Venetia Maxon, M.D.  DATE OF BIRTH:  Dec 14, 1928  DATE OF PROCEDURE:  09/11/2010 DATE OF DISCHARGE:                              OPERATIVE REPORT   PREOPERATIVE DIAGNOSES:  Lumbar spinal stenosis L3-L5 with left L2-3 herniated lumbar disk, degenerative disk disease, and radiculopathy.  POSTOPERATIVE DIAGNOSES:  Lumbar spinal stenosis L3-L5 with left L2-3 herniated lumbar disk, degenerative disk disease, and radiculopathy.  PROCEDURE:  L3-L5 decompression with left L2-3 laminar foraminotomy.  SURGEON:  Danae Orleans. Venetia Maxon, MD  ASSISTANT:  Georgiann Cocker, RN and Clydene Fake, MD  ANESTHESIA:  General endotracheal anesthesia.  ESTIMATED BLOOD LOSS:  Minimal.  COMPLICATIONS:  None.  DISPOSITION:  To recovery.  INDICATIONS:  [name] is a 77 year old woman with severe lumbar spinal stenosis with severe bilateral lower extremity pain.  She also has a lumbar disk herniation at L2-3 on the left.  It was elected to take her to surgery for left L2-3 decompression and decompression L3-L5 levels.  PROCEDURE:  [name] was brought to the operating room.  Following a satisfactory and uncomplicated induction of general endotracheal anesthesia and placement of intravenous lines, the patient was placed in a prone position on the Wilson frame.  Her low back was prepped and draped in usual sterile fashion.  Area of planned incision was infiltrated with local lidocaine.  Incision was made in the midline, carried to the lumbodorsal fascia, was incised in the left side of midline exposing the L2-3, L3-4, and L4-5 levels.  Intraoperative x-ray confirmed correct orientation.  The L3-L5 levels were exposed on the right as well.  A laminar foraminotomy of L2-3 on the left was performed after confirmatory  x-ray was obtained.  After removing redundant ligamentous flavum and having performed the laminectomy, it was felt that the neural elements were sufficiently decompressed and that the diskectomy was not required.  A total laminectomy of L4-L5 was then performed and the inferior most portion of the spinous process of L3 was also removed.  High-speed drill was used to soften the bone and laminectomy was completed at inferior aspect of L3, all of L4, all of L5 with decompression of thecal sac and L3, L4, L5, and S1 nerve roots. Hemostasis was assured.  The wound was irrigated.  Soft tissue was inspected and found to be in good repair.  Dr. Phoebe Perch inspected decompression and felt that it was adequate at all levels.  The wound was irrigated.  Hemostasis was assured.  The lumbodorsal fascia was closed with 0-Vicryl sutures, subcutaneous tissues were approximated with 2-0 Vicryl interrupted inverted sutures.  Skin edges were approximated with 3-0 Vicryl subcuticular stitch.  The wound was dressed with Dermabond.  The patient was extubated in the operating room and taken to recovery in a stable and satisfactory condition having tolerated the operation well.  Counts were correct at the end of the case.     Danae Orleans. Venetia Maxon, M.D.  JDS/MEDQ  D:  09/11/2010  T:  09/12/2010  Job:  [id number]  Electronically Signed by Maeola Harman M.D. on 09/19/2010 10:19:33 AM

## 2010-10-14 ENCOUNTER — Encounter: Payer: Self-pay | Admitting: Family Medicine

## 2010-10-14 ENCOUNTER — Ambulatory Visit (INDEPENDENT_AMBULATORY_CARE_PROVIDER_SITE_OTHER): Payer: Medicare Other | Admitting: Family Medicine

## 2010-10-14 VITALS — BP 124/62 | Temp 99.4°F | Ht 62.5 in | Wt 168.0 lb

## 2010-10-14 DIAGNOSIS — R5383 Other fatigue: Secondary | ICD-10-CM

## 2010-10-14 DIAGNOSIS — R197 Diarrhea, unspecified: Secondary | ICD-10-CM

## 2010-10-14 DIAGNOSIS — R5381 Other malaise: Secondary | ICD-10-CM

## 2010-10-14 DIAGNOSIS — R531 Weakness: Secondary | ICD-10-CM

## 2010-10-14 LAB — BASIC METABOLIC PANEL
BUN: 16 mg/dL (ref 6–23)
CO2: 25 mEq/L (ref 19–32)
Calcium: 9.6 mg/dL (ref 8.4–10.5)
Creatinine, Ser: 1.5 mg/dL — ABNORMAL HIGH (ref 0.4–1.2)

## 2010-10-14 MED ORDER — PROMETHAZINE HCL 25 MG/ML IJ SOLN
25.0000 mg | Freq: Four times a day (QID) | INTRAMUSCULAR | Status: DC | PRN
  Administered 2010-10-14: 25 mg via INTRAMUSCULAR

## 2010-10-14 NOTE — Progress Notes (Signed)
  Subjective:    Patient ID: [name], female    DOB: 1928-11-26, 77 y.o.   MRN: [id number]  HPI  Patient seen with chief complaint of diarrhea and some vomiting. Onset late yesterday. Relates that she had back surgery February 2. About 2 weeks ago had a couple days some mild diarrhea but no recurrent diarrhea until yesterday. No bloody stools. Chills and possible low-grade fever today. Poor appetite. Has not taken anything for her diarrhea.    chronic problems include history of hypothyroidism, depression, hypertension   Review of Systems  patient has generalized weakness. Ongoing nausea and vomiting less malaise but no recurrent episodes today. Intermittent diffuse crampy abdominal pain. No dizziness. No syncope.    Objective:   Physical Exam  the patient is alert and nontoxic in appearance. Temperature 99.4 oropharynx tongue slightly dry otherwise clear  Neck is supple Chest clear to auscultation Heart regular rhythm and rate Abdomen nondistended. Soft nontender.  Positive bowel sounds.       Assessment & Plan:   diarrhea. She has some associated vomiting probably related to viral etiology. Check basic metabolic panel. Try over-the-counter Imodium. Phenergan 25 mg IM given. Dietary device given regarding diarrhea. May need IV fluids if not turning around soon

## 2010-10-14 NOTE — Patient Instructions (Signed)
Diarrhea, Home Care Instructions for Infections caused by germs (bacterial) or a virus commonly cause diarrhea. Your caregiver has determined that with time, rest and fluids, the diarrhea should improve. In general, eat normally while drinking more water than usual. Although water may prevent dehydration, it does not contain salt and minerals (electrolytes). Broths, weak tea without caffeine and oral rehydration solutions (ORS) replace fluids and electrolytes. Small amounts of fluids should be taken frequently. Large amounts at one time may not be tolerated. Plain water may be harmful in infants and the elderly. Oral rehydrating solutions (ORS) are available at pharmacies and grocery stores. ORS replace water and important electrolytes in proper proportions. Sports drinks are not as effective as ORS and may be harmful due to sugars worsening diarrhea.  ORS is especially recommended for use in children with diarrhea. As a general guideline for children, replace any new fluid losses from diarrhea and/or vomiting with ORS as follows:   If your child weighs 22 pounds or under (10 kg or less), give 60-120 mL (1/4 - 1/2 cup or 2 - 4 ounces) of ORS for each episode of diarrheal stool or vomiting episode.   If your child weighs more than 22 pounds (more than 10 kgs), give 120-240 mL (1/2 - 1 cup or 4 - 8 ounces) of ORS for each diarrheal stool or episode of vomiting.   While correcting for dehydration, children should eat normally. However, foods high in sugar should be avoided because this may worsen diarrhea. Large amounts of carbonated soft drinks, juice, gelatin desserts and other highly sugared drinks should be avoided.   After correction of dehydration, other liquids that are appealing to the child may be added. Children should drink small amounts of fluids frequently and fluids should be increased as tolerated. Children should drink enough fluids to keep urine clear or pale yellow.   Adults should eat  normally while drinking more fluids than usual. Drink small amounts of fluids frequently and increase as tolerated. Drink enough fluids to keep urine clear or pale yellow. Broths, weak decaffeinated tea, lemon lime soft drinks (allowed to go flat) and ORS replace fluids and electrolytes.  Avoid:  Carbonated drinks.  Juice.   Extremely hot or cold fluids.   Caffeine drinks.   Fatty, greasy foods.   Alcohol.  Tobacco.   Too much intake of anything at one time.   Gelatin desserts.    Probiotics are active cultures of beneficial bacteria. They may lessen the amount and number of diarrheal stools in adults. Probiotics can be found in yogurt with active cultures and in supplements.   Wash hands well to avoid spreading bacteria and virus.   Anti-diarrheal medications are not recommended for infants and children.   Only take over-the-counter or prescription medicines for pain, discomfort or fever as directed by your caregiver. Do not give aspirin to children because it may cause Reye's Syndrome.   For adults, ask your caregiver if you should continue all prescribed and over-the-counter medicines.   If your caregiver has given you a follow-up appointment, it is very important to keep that appointment. Not keeping the appointment could result in a chronic or permanent injury, and disability. If there is any problem keeping the appointment, you must call back to this facility for assistance.  SEEK IMMEDIATE MEDICAL CARE IF:  You or your child is unable to keep fluids down or other symptoms or problems become worse in spite of treatment.   Vomiting or diarrhea develops and becomes persistent.     There is vomiting of blood or bile (green material).   There is blood in the stool or the stools are black and tarry.   There is no urine output in 6-8 hours or there is only a small amount of very dark urine.   Abdominal pain develops, increases or localizes.   You or your child has an oral  temperature over 101, not controlled with medicine.   Your baby is older than 3 months with a rectal temperature of 102.0F (38.9 C) or higher.   Your baby is 3 months old or younger with a rectal temperature of 100.4 F (38 C) or higher.   You or your child develops excessive weakness, dizziness, fainting or extreme thirst.   You or your child develops a rash, stiff neck, severe headache or become irritable or sleepy and difficult to awaken.  MAKE SURE YOU:   Understand these instructions.   Will watch your condition.   Will get help right away if you are not doing well or get worse.  Document Released: 07/17/2002 Document Re-Released: 10/21/2009 [employer] Patient Information 2011 [employer]. 

## 2010-10-14 NOTE — Progress Notes (Signed)
Quick Note:  Pt son informed ______ 

## 2010-10-23 LAB — BASIC METABOLIC PANEL
BUN: 9 mg/dL (ref 6–23)
CO2: 26 mEq/L (ref 19–32)
Calcium: 9.5 mg/dL (ref 8.4–10.5)
Creatinine, Ser: 1.31 mg/dL — ABNORMAL HIGH (ref 0.4–1.2)
GFR calc non Af Amer: 39 mL/min — ABNORMAL LOW (ref >60)
Glucose, Bld: 100 mg/dL — ABNORMAL HIGH (ref 70–99)
Sodium: 139 mEq/L (ref 135–145)

## 2010-10-23 LAB — SURGICAL PCR SCREEN
MRSA, PCR: NEGATIVE
Staphylococcus aureus: NEGATIVE

## 2010-10-23 LAB — CBC
MCH: 30.3 pg (ref 26.0–34.0)
MCHC: 32.8 g/dL (ref 30.0–36.0)
Platelets: 324 10*3/uL (ref 150–400)
RDW: 12.9 % (ref 11.5–15.5)

## 2010-11-10 ENCOUNTER — Inpatient Hospital Stay (HOSPITAL_COMMUNITY)
Admission: EM | Admit: 2010-11-10 | Discharge: 2010-11-17 | DRG: 494 | Disposition: A | Discharge status: Skilled Nursing Facility | Payer: Medicare Other | Attending: Internal Medicine | Admitting: Internal Medicine

## 2010-11-10 ENCOUNTER — Emergency Department (HOSPITAL_COMMUNITY): Payer: Medicare Other

## 2010-11-10 DIAGNOSIS — M4800 Spinal stenosis, site unspecified: Secondary | ICD-10-CM | POA: Diagnosis present

## 2010-11-10 DIAGNOSIS — K5900 Constipation, unspecified: Secondary | ICD-10-CM | POA: Diagnosis not present

## 2010-11-10 DIAGNOSIS — N189 Chronic kidney disease, unspecified: Secondary | ICD-10-CM | POA: Diagnosis present

## 2010-11-10 DIAGNOSIS — S82109A Unspecified fracture of upper end of unspecified tibia, initial encounter for closed fracture: Principal | ICD-10-CM | POA: Diagnosis present

## 2010-11-10 DIAGNOSIS — J45909 Unspecified asthma, uncomplicated: Secondary | ICD-10-CM | POA: Diagnosis present

## 2010-11-10 DIAGNOSIS — G4733 Obstructive sleep apnea (adult) (pediatric): Secondary | ICD-10-CM | POA: Diagnosis present

## 2010-11-10 DIAGNOSIS — I129 Hypertensive chronic kidney disease with stage 1 through stage 4 chronic kidney disease, or unspecified chronic kidney disease: Secondary | ICD-10-CM | POA: Diagnosis present

## 2010-11-10 DIAGNOSIS — D72829 Elevated white blood cell count, unspecified: Secondary | ICD-10-CM | POA: Diagnosis not present

## 2010-11-10 DIAGNOSIS — G2581 Restless legs syndrome: Secondary | ICD-10-CM | POA: Diagnosis present

## 2010-11-10 DIAGNOSIS — W108XXA Fall (on) (from) other stairs and steps, initial encounter: Secondary | ICD-10-CM | POA: Diagnosis present

## 2010-11-10 DIAGNOSIS — D509 Iron deficiency anemia, unspecified: Secondary | ICD-10-CM | POA: Diagnosis not present

## 2010-11-10 DIAGNOSIS — M1990 Unspecified osteoarthritis, unspecified site: Secondary | ICD-10-CM | POA: Diagnosis present

## 2010-11-10 DIAGNOSIS — E039 Hypothyroidism, unspecified: Secondary | ICD-10-CM | POA: Diagnosis present

## 2010-11-10 LAB — URINALYSIS, ROUTINE W REFLEX MICROSCOPIC
Bilirubin Urine: NEGATIVE
Ketones, ur: NEGATIVE mg/dL
Nitrite: NEGATIVE
Specific Gravity, Urine: 1.015 (ref 1.005–1.030)
Urobilinogen, UA: 0.2 mg/dL (ref 0.0–1.0)
pH: 6 (ref 5.0–8.0)

## 2010-11-10 LAB — CBC
HCT: 33.5 % — ABNORMAL LOW (ref 36.0–46.0)
MCHC: 32.5 g/dL (ref 30.0–36.0)
Platelets: 258 10*3/uL (ref 150–400)
RDW: 13.4 % (ref 11.5–15.5)
WBC: 10.6 10*3/uL — ABNORMAL HIGH (ref 4.0–10.5)

## 2010-11-10 LAB — DIFFERENTIAL
Basophils Absolute: 0.1 10*3/uL (ref 0.0–0.1)
Eosinophils Absolute: 0.2 10*3/uL (ref 0.0–0.7)
Eosinophils Relative: 2 % (ref 0–5)
Monocytes Absolute: 0.8 10*3/uL (ref 0.1–1.0)

## 2010-11-10 LAB — BASIC METABOLIC PANEL
BUN: 16 mg/dL (ref 6–23)
Calcium: 9 mg/dL (ref 8.4–10.5)
GFR calc non Af Amer: 43 mL/min — ABNORMAL LOW (ref >60)
Glucose, Bld: 110 mg/dL — ABNORMAL HIGH (ref 70–99)
Potassium: 3.7 mEq/L (ref 3.5–5.1)
Sodium: 138 mEq/L (ref 135–145)

## 2010-11-11 LAB — CBC
HCT: 28.7 % — ABNORMAL LOW (ref 36.0–46.0)
Hemoglobin: 9.2 g/dL — ABNORMAL LOW (ref 12.0–15.0)
MCV: 90.8 fL (ref 78.0–100.0)
RBC: 3.16 MIL/uL — ABNORMAL LOW (ref 3.87–5.11)
RDW: 13.6 % (ref 11.5–15.5)
WBC: 6.1 10*3/uL (ref 4.0–10.5)

## 2010-11-11 LAB — VITAMIN B12: Vitamin B-12: 878 pg/mL (ref 211–911)

## 2010-11-11 LAB — IRON AND TIBC
Saturation Ratios: 11 % — ABNORMAL LOW (ref 20–55)
UIBC: 194 ug/dL

## 2010-11-11 LAB — BASIC METABOLIC PANEL
BUN: 14 mg/dL (ref 6–23)
Chloride: 106 mEq/L (ref 96–112)
GFR calc non Af Amer: 49 mL/min — ABNORMAL LOW (ref >60)
Glucose, Bld: 112 mg/dL — ABNORMAL HIGH (ref 70–99)
Potassium: 3.7 mEq/L (ref 3.5–5.1)
Sodium: 137 mEq/L (ref 135–145)

## 2010-11-11 LAB — FERRITIN: Ferritin: 75 ng/mL (ref 10–291)

## 2010-11-12 LAB — CBC
HCT: 28.3 % — ABNORMAL LOW (ref 36.0–46.0)
MCHC: 32.9 g/dL (ref 30.0–36.0)
MCV: 91.9 fL (ref 78.0–100.0)
Platelets: 221 10*3/uL (ref 150–400)
RDW: 13.7 % (ref 11.5–15.5)

## 2010-11-14 ENCOUNTER — Inpatient Hospital Stay (HOSPITAL_COMMUNITY): Payer: Medicare Other

## 2010-11-14 LAB — BASIC METABOLIC PANEL
BUN: 18 mg/dL (ref 6–23)
CO2: 26 mEq/L (ref 19–32)
Chloride: 103 mEq/L (ref 96–112)
Glucose, Bld: 101 mg/dL — ABNORMAL HIGH (ref 70–99)
Potassium: 3.9 mEq/L (ref 3.5–5.1)

## 2010-11-14 LAB — CBC
HCT: 27.9 % — ABNORMAL LOW (ref 36.0–46.0)
MCHC: 33 g/dL (ref 30.0–36.0)
MCV: 91.2 fL (ref 78.0–100.0)
Platelets: 292 10*3/uL (ref 150–400)
RDW: 13.7 % (ref 11.5–15.5)

## 2010-11-15 LAB — BASIC METABOLIC PANEL
BUN: 16 mg/dL (ref 6–23)
CO2: 26 mEq/L (ref 19–32)
Chloride: 102 mEq/L (ref 96–112)
Glucose, Bld: 107 mg/dL — ABNORMAL HIGH (ref 70–99)
Potassium: 4 mEq/L (ref 3.5–5.1)

## 2010-11-15 LAB — URINALYSIS, ROUTINE W REFLEX MICROSCOPIC
Bilirubin Urine: NEGATIVE
Glucose, UA: NEGATIVE mg/dL
Hgb urine dipstick: NEGATIVE
Nitrite: NEGATIVE
Protein, ur: 30 mg/dL — AB
Urobilinogen, UA: 0.2 mg/dL (ref 0.0–1.0)
pH: 5.5 (ref 5.0–8.0)

## 2010-11-15 LAB — URINE MICROSCOPIC-ADD ON

## 2010-11-16 LAB — CBC
HCT: 33.6 % — ABNORMAL LOW (ref 36.0–46.0)
Hemoglobin: 11.4 g/dL — ABNORMAL LOW (ref 12.0–15.0)
MCHC: 34 g/dL (ref 30.0–36.0)
Platelets: 308 10*3/uL (ref 150–400)
RDW: 13.2 % (ref 11.5–15.5)

## 2010-11-16 LAB — COMPREHENSIVE METABOLIC PANEL
Albumin: 3.7 g/dL (ref 3.5–5.2)
Alkaline Phosphatase: 84 U/L (ref 39–117)
BUN: 15 mg/dL (ref 6–23)
Calcium: 9.1 mg/dL (ref 8.4–10.5)
Glucose, Bld: 93 mg/dL (ref 70–99)
Potassium: 3.7 mEq/L (ref 3.5–5.1)
Sodium: 138 mEq/L (ref 135–145)
Total Protein: 6.4 g/dL (ref 6.0–8.3)

## 2010-11-16 LAB — TYPE AND SCREEN
ABO/RH(D): O POS
Antibody Screen: NEGATIVE

## 2010-11-16 LAB — PROTIME-INR: INR: 1 (ref 0.00–1.49)

## 2010-11-16 LAB — ABO/RH: ABO/RH(D): O POS

## 2010-11-16 LAB — DIFFERENTIAL
Lymphs Abs: 1.9 10*3/uL (ref 0.7–4.0)
Monocytes Absolute: 0.7 10*3/uL (ref 0.1–1.0)
Monocytes Relative: 9 % (ref 3–12)
Neutro Abs: 4.3 10*3/uL (ref 1.7–7.7)
Neutrophils Relative %: 59 % (ref 43–77)

## 2010-11-17 DIAGNOSIS — M79609 Pain in unspecified limb: Secondary | ICD-10-CM

## 2010-11-17 LAB — BASIC METABOLIC PANEL
BUN: 26 mg/dL — ABNORMAL HIGH (ref 6–23)
GFR calc non Af Amer: 32 mL/min — ABNORMAL LOW (ref >60)
Potassium: 3.9 mEq/L (ref 3.5–5.1)

## 2010-11-17 LAB — CBC
HCT: 25.1 % — ABNORMAL LOW (ref 36.0–46.0)
MCHC: 33.1 g/dL (ref 30.0–36.0)
Platelets: 308 10*3/uL (ref 150–400)
RDW: 13.3 % (ref 11.5–15.5)
WBC: 8.6 10*3/uL (ref 4.0–10.5)

## 2010-11-17 LAB — GLUCOSE, RANDOM: Glucose, Bld: 156 mg/dL — ABNORMAL HIGH (ref 70–99)

## 2010-11-19 LAB — CBC
HCT: 35 % — ABNORMAL LOW (ref 36.0–46.0)
Platelets: 316 10*3/uL (ref 150–400)
WBC: 8.4 10*3/uL (ref 4.0–10.5)

## 2010-11-19 LAB — BASIC METABOLIC PANEL
BUN: 19 mg/dL (ref 6–23)
Calcium: 9.1 mg/dL (ref 8.4–10.5)
Creatinine, Ser: 1.54 mg/dL — ABNORMAL HIGH (ref 0.4–1.2)
GFR calc non Af Amer: 33 mL/min — ABNORMAL LOW (ref >60)
Potassium: 4.1 mEq/L (ref 3.5–5.1)

## 2010-11-26 NOTE — Discharge Summary (Signed)
[name], [name]                  ACCOUNT NO.:  [id number]  MEDICAL RECORD NO.:  [id number]           PATIENT TYPE:  I  LOCATION:  5501                         FACILITY:  [facility]  PHYSICIAN:  Kathlen Mody, MD       DATE OF BIRTH:  08/15/1928  DATE OF ADMISSION:  11/10/2010 DATE OF DISCHARGE:  11/17/2010                        DISCHARGE SUMMARY - REFERRING   PRIMARY CARE PHYSICIAN:  Dr. Cato Mulligan.  DISCHARGE DIAGNOSES: 1. Status post mechanical fall, left tibial comminuted fracture,     status post open reduction and internal fixation. 2. Hypertension. 3. Obstructive sleep apnea. 4. Restless legs syndrome. 5. Degenerative joint disease. 6. Chronic kidney disease. 7. Hypothyroidism. 8. Asthma. 9. Spinal stenosis, status post decompression and laminectomy.  DISCHARGE MEDICATIONS: 1. Zolpidem 10 mg p.o. half a tablet at bedtime p.r.n. 2. MiraLax 17 grams daily p.r.n. 3. Synacort 2 tablets at bedtime p.r.n. 4. Cyclobenzaprine 5 mg p.o. 1 tablet t.i.d. p.r.n. 5. Amlodipine 10 mg daily. 6. Ferrous sulfate 325 mg 1 tablet twice a day. 7. Multivitamin tablet, 1 tablet daily. 8. Hydrocodone/APAP 5/325 tablet every 6 hours p.r.n. for about 10     days. 9. Align 1 tablet daily p.r.n. 10.Celebrex 200 mg 1 capsule daily p.r.n. 11.Omeprazole 40 mg 1 capsule daily. 12.Calcium carbonate/vitamin D 1 tablet daily. 13.ProAir inhaler 1 puff every 4 hours p.r.n. 14.Hydrochlorothiazide 12.5 mg 1 capsule daily. 15.Levothyroxine 125 mcg p.o. 1 tab daily. 16.Lactate 1 tab daily p.r.n. 17.Vitamin B12 one tablet daily.  PERTINENT LABORATORY DATA:  On admission, the patient had a CBC which showed WBC count of 10.6, hemoglobin of 10.9, hematocrit of 33.5, platelets of 258.  A basic metabolic panel shows a creatinine of 1.21, BUN of 16, glucose of 110.  Urinalysis negative for nitrites and trace leukocytes.  Anemia panel showed a creatinine of 75, vitamin B12 of 878, folate of 18.6.  Basic metabolic  panel done on 04/06 showed a creatinine of 1.5, glucose of 101.  Basic metabolic panel done on 04/09 showed a sodium of 130, glucose of 113, BUN of 26, creatinine of 1.57. Hemoglobin of 8.3, hematocrit of 25.1, platelets of 308.  RADIOLOGY:  The patient had an x-ray of the left femur, which shows tibial plateau fracture.  X-ray of the left hip which was negative for fracture.  X-ray of the left ankle negative for fracture.  X-ray of the left knee, which showed comminuted tibial plateau fracture.  X-ray of the left tibia and fibula showed the same.  CT of the left knee without contrast shows comminuted fracture of the proximal tibia.  Chest x-ray showed subtle opacity, left hilum appears slightly different from the prior x-ray and the repeat x-ray showed that this left perihilar density is a vessel on end.  CONSULTS:  Orthopedic consultation from Dr. Lestine Box was called.  PROCEDURES DONE:  She had an open reduction and internal fixation of the left tibial plateau fracture.  BRIEF HOSPITAL COURSE:  This is a 77 year old lady with history of hypertension, obstructive sleep apnea, asthma, hypothyroidism, restless legs syndrome, chronic kidney disease, who came in status post mechanical fall, who was  found to have left lower extremity pain.  She was found to have left tibial plateau fractures from the x-rays of the left knee, left leg and left ankle, and orthopedic consult from Dr. Lestine Box, was called, who got Dr. Lajoyce Corners to do an ORIF on her on 11/14/2010.  Meanwhile, the patient is on pain medications for pain control, and she is on nonweightbearing on the left lower extremity. Pain is controlled with pain medications on Vicodin and Celebrex at this time.  A PT/OT evaluation was requested, who recommended short-term rehab at [facility].  The patient is requested that she be transferred or discharged to [facility], and she is being discharged today to [facility].  Hypertension.  Blood  pressure is controlled.  Continue with amlodipine and hydrochlorothiazide.  Asthma and obstructive sleep apnea, appear to be controlled and stable.  Hypothyroidism.  Continue with the patient's home dose of Synthroid at this time.  Chronic kidney disease.  The patient's creatinine ranges between 1.5- 1.2.  On the day of discharge, the patient's creatinine was 1.57. Benicar/losartan was stopped.  She needs further workup as an outpatient with an ultrasound of the kidney and followup monitor for renal function test in about 2 weeks.  The patient had an urinalysis done, which was negative for nitrites or leukocytes and no casts are seen.  The patient is urinating about 300 mL every 4-6 hours.  On discharge, the patient will need further followup with nephrologist for evaluation of her chronic kidney disease and at this time, I would stop her ARB.  Anemia, most likely a combination of anemia secondary to blood loss versus iron-deficiency anemia.  She is started on ferrous sulfate supplements and recommended to follow up with her CBC in about 4 weeks at her PCP's office or at the MD physician at [facility].  PHYSICAL EXAMINATION ON THE DAY OF DISCHARGE:  VITAL SIGNS:  Temperature of 97.6, pulse of 70, respirations 80, blood pressure 109/57, saturating 96% on room air. GENERAL:  She is alert, afebrile, comfortable, sitting in the chair. CARDIOVASCULAR:  S1, S2 present. RESPIRATORY:  Good air entry bilaterally.  No wheezing or rhonchi. ABDOMEN:  Soft, nontender, nondistended. EXTREMITIES:  Left lower extremity is swollen.  She has status post ORIF in the left lower extremity.  Right lower extremity; no pedal edema. Good pulses, symmetrical.  The patient at this time is hemodynamically stable for discharge to [facility].  FOLLOWUP:  Follow up with renal function test as outpatient with nephrologist/PCP in about 1-2 weeks.          ______________________________ Kathlen Mody,  MD     VA/MEDQ  D:  11/17/2010  T:  11/17/2010  Job:  [id number]  Electronically Signed by Kathlen Mody MD on 11/26/2010 09:46:47 PM
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
## 2010-12-01 NOTE — H&P (Signed)
[name], [name]                  ACCOUNT NO.:  [id number]  MEDICAL RECORD NO.:  [id number]           PATIENT TYPE:  E  LOCATION:  [facility]                         FACILITY:  [facility]  PHYSICIAN:  Zannie Cove, MD     DATE OF BIRTH:  1929/01/26  DATE OF ADMISSION:  11/10/2010 DATE OF DISCHARGE:                             HISTORY & PHYSICAL   PRIMARY CARE PHYSICIAN:  Valetta Mole. Swords, MD  CHIEF COMPLAINT:  Fall and leg pain.  HISTORY OF PRESENT ILLNESS:  [name] is a very pleasant Caucasian female with a history of spinal stenosis and degenerative joint disease, hypertension, fell down a flight of stairs today and noted severe leg pain, was unable to stand and bear weight on her left leg, subsequently brought to the hospital by EMS with her son where she was noted to have left tibial comminuted fracture and is being admitted for pain control and currently pending Ortho eval.  The patient denies any chest pain, palpitations, or dizziness prior to the fall.  PAST MEDICAL HISTORY: 1. Degenerative joint disease, spinal stenosis status post     decompression and laminectomy x3. 2. History of hypertension. 3. History of obstructive sleep apnea. 4. History of restless legs syndrome. 5. History of asthma. 6. History of hypothyroidism.  HOME MEDICATIONS: 1. Amlodipine, dose unknown. 2. Micardis 80/12.5 one tablet daily. 3. Ambien 12.5 mg p.o. at bedtime p.r.n. 4. Synthroid 125 mcg daily. 5. Omeprazole 40 mg daily.  ALLERGIES:  INCLUDE MORPHINE AND OXYCODONE.  SOCIAL HISTORY:  Retired from [employer].  Lives alone at home, is independent in all ADLs.  Denies any history of alcohol, tobacco, or illicit drug use.  Has 3 adult children.  FAMILY HISTORY:  Father deceased from stomach aneurysm and mother deceased secondary to renal cancer.  REVIEW OF SYSTEMS:  Twelve systems review is negative except per HPI.  PHYSICAL EXAMINATION:  VITAL SIGNS:  Temperature is 97.9, pulse is  68, blood pressure 170/72, respirations 20, satting 97% room air. GENERAL:  She is averagely-built Caucasian female lying in the stretcher, in no acute distress. HEENT:  Pupils round and reactive to light.  Extraocular movements intact. NECK:  No JVD, lymphadenopathy, or thyromegaly. CARDIOVASCULAR:  S1 and S2, regular rate and rhythm. LUNGS:  Clear to auscultation bilaterally. ABDOMEN:  Soft and nontender.  Positive bowel sounds.  No organomegaly. EXTREMITIES:  No edema, clubbing, or cyanosis.  Left leg proximal severe tenderness noted along the shin with some swelling.  There is intact sensation in distal pulses.  LABORATORY DATA:  Review of laboratory data shows white count of 10.6, hemoglobin 10.9, platelets 258.  Chemistry; sodium 138, potassium 3.7, chloride 105, bicarb 25, BUN 16, creatinine 1.2, glucose 110.  UA is negative.  DIAGNOSTICS:  X-ray of the knee shows comminuted tibial plateau fracture, possible patella fracture.  Ankle and hip show no fracture. CT of the left knee shows comminuted fracture of the proximal tibia.  ASSESSMENT/PLAN:  [name] is a very pleasant 77 year old female status post mechanical fall with, 1. Left tibial comminuted fracture with severe leg pain and inability  to bear weight. 2. Hypertension. 3. Obstructive sleep apnea. 4. Hypertension. 5. Restless legs syndrome. 6. History of spinal stenosis/degenerative joint disease.  PLAN:  We will admit her to regular medical floor.  We will put her on p.r.n. hydrocodone and Dilaudid for pain control.  We will also have Orthopedic Surgery in consult and give their recommendations and pending Orthopedic consult, we will have PT eval based on weightbearing recommendations per Ortho.  Since the patient lives alone, may need to look into an appropriate disposition at the time of discharge.     Zannie Cove, MD     PJ/MEDQ  D:  11/10/2010  T:  11/10/2010  Job:  [id number]  cc:   Valetta Mole.  Swords, MD  Electronically Signed by Zannie Cove  on 12/01/2010 08:24:03 PM

## 2010-12-02 NOTE — Op Note (Signed)
  [name], [name]                  ACCOUNT NO.:  [id number]  MEDICAL RECORD NO.:  [id number]           PATIENT TYPE:  LOCATION:                                 FACILITY:  PHYSICIAN:  Nadara Mustard, MD     DATE OF BIRTH:  1929-07-01  DATE OF PROCEDURE:  11/14/2010 DATE OF DISCHARGE:                              OPERATIVE REPORT   PREOPERATIVE DIAGNOSIS:  Left medial comminuted tibial plateau fracture with lateral tibial plateau fracture.  POSTOPERATIVE DIAGNOSIS:  Left medial comminuted tibial plateau fracture with lateral tibial plateau fracture.  PROCEDURE:  Open reduction and internal fixation with a medial plate for bicondylar tibial plateau fracture.  SURGEON:  Nadara Mustard, MD  ANESTHESIA:  General.  ESTIMATED BLOOD LOSS:  Minimal.  ANTIBIOTICS:  Kefzol 1 g.  DRAINS:  None.  COMPLICATIONS:  None.  TOURNIQUET TIME:  Approximately 45 minutes at 300 mmHg at the thigh.  DISPOSITION:  To PACU in stable condition.  INDICATIONS FOR PROCEDURE:  The patient is a 77 year old woman with a comminuted medial tibial plateau fracture and lateral tibial plateau fracture of the left knee.  The patient was coming down stairs from her attic when she fell.  The patient was admitted, medically stabilized, and presents at this time for surgical intervention.  Risks and benefits were discussed including infection, neurovascular injury, persistent pain, arthritis, need for total knee replacement.  The patient states she understands and wished to proceed at this time.  DESCRIPTION OF THE PROCEDURE:  The patient was brought to OR room 15 and underwent general anesthetic.  After adequate level of anesthesia was obtained, the patient was placed supine on the [place] fracture table and the left lower extremity was prepped using DuraPrep, draped in a sterile field, and Ioban was used to cover all exposed skin.  A medial incision was made from the medial femoral condyle centered on the  medial femoral condyle and extending distally to the posterior aspect of the tibia.  Blunt dissection was carried down to the fascia.  The hamstring and pes anserinus insertion was elevated anteriorly.  The gastroc was elevated posteriorly.  The fracture site was cleansed, reduced, and a plate was applied medially.  C-arm fluoroscopy verified reduction.  The plate was secured distally with cortical compression screws.  Once the plate was secured to the bone, the proximal locking screws were placed x3 with three distal cortical screws placed under compression.  C-arm fluoroscopy verified reduction.  The wound was irrigated with normal saline.  The subcu was closed using 2-0 Vicryl.  The skin was closed using approximating staples.  The wound was covered with Mepilex dressing. Her knee immobilizer was reapplied.  The patient was extubated and taken to PACU in stable condition.     Nadara Mustard, MD     MVD/MEDQ  D:  11/14/2010  T:  11/15/2010  Job:  [id number]  Electronically Signed by Aldean Baker MD on 12/02/2010 03:00:27 PM

## 2010-12-23 NOTE — Op Note (Signed)
NAMEALERA, QUEVEDO                  ACCOUNT NO.:  [id number]   MEDICAL RECORD NO.:  [id number]          PATIENT TYPE:  INP   LOCATION:  3524                         FACILITY:  [facility]   PHYSICIAN:  Danae Orleans. Venetia Maxon, M.D.  DATE OF BIRTH:  02/25/1929   DATE OF PROCEDURE:  12/04/2008  DATE OF DISCHARGE:                               OPERATIVE REPORT   PREOPERATIVE DIAGNOSIS:  Herniated cervical disk with spondylosis,  myelopathy with stenosis with radiculopathy at C4-5.   POSTOPERATIVE DIAGNOSIS:  Herniated cervical disk with spondylosis,  myelopathy with stenosis with radiculopathy at C4-5.   PROCEDURE:  Anterior cervical decompression and fusion at C4-5 with  allograft bone graft morselized bone autograft and anterior cervical  plate.   SURGEON:  Danae Orleans. Venetia Maxon, MD   ASSISTANT:  1. Georgiann Cocker, RN  2. Payton Doughty, MD   ANESTHESIA:  General endotracheal anesthesia.   ESTIMATED BLOOD LOSS:  Minimal.   COMPLICATIONS:  None.   DISPOSITION:  Recovery.   INDICATIONS:  [name] is a 77 year old woman with cervical myelopathy  with cervical stenosis and cord compression at C4-5 level with left  deltoid weakness.  It was elected to take her to the Surgery for  anterior cervical decompression and fusion at C4-5 level.   PROCEDURE:  Ms. Fulop was brought to the operating room.  Following  satisfactory and uncomplicated induction of general endotracheal  anesthesia and placement of intravenous lines, the patient was placed in  the supine position on the operating table.  The neck was placed in the  neutral alignment.  She was placed in 5 pounds of halter traction.  Her  anterior neck was then prepped and draped in the usual sterile fashion.  Area of planned incision was infiltrated with local lidocaine.  Incision  was made in the left side of midline and carried through the platysma  layer, exposing the anterior border of the sternocleidomastoid muscle  using blunt dissection.  The  carotid sheath was kept lateral and trachea  and esophagus kept medial exposing the anterior cervical spine.  A bent  spinal needle was placed and it was felt to be at C4-5 level and this  was confirmed on intraoperative x-ray.  Longus colli muscles were taken  down from the anterior cervical spine from C4-C5 bilaterally and self-  retaining shadow line retractor was placed to facilitate the exposure.  The interspace was incised with a 15 blade and disk material was removed  in a piecemeal fashion.  Endplates were stripped of residual disk  material.  Distraction pins were placed at C4 and C5.  Using gentle  distraction, the interspace was opened.  A high-speed drill was used to  decorticate the endplates and bone graft was saved for later use with  bone grafting.  The posterior longitudinal ligament was removed under  microscopic visualization.  The spinal cord dura and C5 nerve roots were  decompressed.  Hemostasis was assured with Gelfoam soaked in thrombin.  After trial sizing, a medium 6-mm allograft bone wedge was selected and  fashioned with high-speed drill, packed with  morselized bone autograft  and demineralized bone matrix inserted in the interspace and countersunk  appropriately.  A 14-mm Trestle anterior cervical plate was affixed to  the anterior cervical spine using variable angle 40-mm screws, 2 at C4  and 2 at C5.  All screws had excellent purchase.  Locking mechanisms  were engaged.  Traction weight was removed prior to placing the plate.  Wound was irrigated.  Soft tissues were inspected and found to be in  good repair.  Final x-ray demonstrated well-positioned interbody graft  and anterior cervical plate.  Platysma layer was closed with 0 Vicryl  sutures.  Skin edges were approximated with 3-0 Vicryl subcuticular  stitch.  The wound was dressed with Dermabond.  The patient was  extubated in the operating room and taken to the recovery room in stable  satisfactory  condition having tolerated the operation well.  Counts were  correct at the end of the case.      Danae Orleans. Venetia Maxon, M.D.  Electronically Signed     JDS/MEDQ  D:  12/04/2008  T:  12/04/2008  Job:  [id number]

## 2010-12-26 NOTE — Op Note (Signed)
[name], [name]                  ACCOUNT NO.:  [id number]   MEDICAL RECORD NO.:  [id number]          PATIENT TYPE:  AMB   LOCATION:  ENDO                         FACILITY:  [facility]   PHYSICIAN:  Petra Kuba, M.D.    DATE OF BIRTH:  [birth year]   DATE OF PROCEDURE:  01/06/2005  DATE OF DISCHARGE:                                 OPERATIVE REPORT   PROCEDURE:  Esophagogastroduodenoscopy with Savory dilatation.   INDICATIONS:  Increasing dysphagia in a patient who has been helped by  dilations in the past. Consent was signed after risks benefits, methods  options thoroughly discussed multiple times in the past.   ADDITIONAL MEDICINES FOR THIS PROCEDURE:  Demerol 20, versed 1.5.   PROCEDURE:  The video endoscope was inserted by direct vision. The esophagus  was normal. No signs of mass or stricturing was seen or any significant  esophagitis. She might have had a tiny hiatal hernia. Scope passed into the  stomach, advanced to the antrum where some antritis was seen, advanced  through a normal pylorus into a normal duodenum, bulb, and around the C loop  to a normal second portion of the duodenum. Scope was withdrawn back to the  bulb, and a good look there ruled out ulcers in that location. Scope was  withdrawn back to the stomach and retroflexed. Cardia, fundus, angularis,  and lesser and greater curve were all normal on retroflexed visualization.  Straight visualization of the stomach did not reveal any additional  findings. Scope was then slowly withdrawn to about 18 cm, again confirming  the normal esophagus. Scope was then advanced through the antrum, and the  [facility] wire was advanced in a one to one motion. The scope was removed,  trying to advance the wire in tandem. When the scope was removed, the wire  was at the 4 bar mark which again usually corresponds to the proper position  in the antrum, and we went ahead and proceeded with the [facility] dilation, 16  mm only, over the wire.  Once the dilator was inserted, the wire was  withdrawn back in the dilator. It was held for 10 seconds. Both were removed  in tandem. There was no heme or resistance on the dilator. The procedure was  terminated, and the dilator wire was removed. The patient tolerated the  procedure well. There was no obvious immediate complication.   ENDOSCOPIC DIAGNOSIS:  1. Questionable tiny hiatal hernia.  2. Minimal antritis.  3. Otherwise normal esophagogastroduodenoscopy and therapy Savory dilation      without fluoro to 16 mm only.     PLAN:  Prilosec over-the-counter. Followup p.r.n. or in two months to  recheck symptoms and make sure no further workup, plans for aggressive  dilatation, etc., is needed.      MEM/MEDQ  D:  01/06/2005  T:  01/06/2005  Job:  [id number]   cc:   Valetta Mole. Swords, M.D. [facility]

## 2010-12-26 NOTE — Assessment & Plan Note (Signed)
[facility]                               PULMONARY OFFICE NOTE   NAME:[name], [name] HANGARTNER                         MRN:          [id number]  DATE:04/05/2006                            DOB:          02/01/1929    PROBLEMS:  1. Obstructive sleep apnea with insomnia.  2. Periodic limb movement/restless legs.  3. History of asthma.   HISTORY:  CPAP was titrated to 8 CWP and she is converted now to a fixed  pressure machine.  With this, she has been experiencing some sneezing.  She  is working with [name] and has begun using the humidifier.  As she learns to  work with this, her sneezing and nasal congestion seem to be improving  substantially.  We talked about comfort measures.  She did not like Mirapex  and we switched her to clonazepam 0.5 mg.  She says sometimes that is not  enough and sometimes it carries over too long into the morning.  We again  compared available therapies but her leg jerks bother her enough that it is  worth it to her to treat.   MEDICATIONS:  1. CPAP 8 CWP.  2. Micardis 80/12.5 one-half daily.  3. Nexium 40 mg.  4. Synthroid 150 mcg.  5. Fish oil.  6. Hyoscyamine.  7. Gaviscon.  8. Clonazepam 0.5 mg.  9. Albuterol inhaler.  10.Occasional Ambien CR 12.5 mg.   DRUG INTOLERANCE:  ONLY TO LACTOSE.   OBJECTIVE:  Weight 189 pounds, BP 142/80, pulse regular 59, room air  saturation 98%.  She is alert.  There are no pressure marks from the mask on  her face.  Her nasal airway is clear.  Pulse regular.  Breathing is  unlabored.   IMPRESSION:  1. Obstructive sleep apnea still needing some more time to finish      adjusting the CPAP but I think she will be able to make this work.  2. Restless leg syndrome.  She is going to experiment with different doses      of clonazepam and may still add Mirapex from time to time.  We have      reviewed options.  3. Asthma incidentally noted and currently well-controlled.   PLAN:  1. Change  clonazepam to 1 mg tablet, taking from one-half to 2 tablets as      needed, with discussion particularly of dependence and sedation issues      in comparison to Mirapex and Requip.  2. Saline nasal gel.  3. Schedule return 6 weeks, earlier p.r.n.  4. Emphasis on good sleep hygiene.                                   Clinton D. Maple Hudson, MD, FCCP, FACP   CDY/MedQ  DD:  04/05/2006  DT:  04/06/2006  Job #:  [id number]   cc:   [name] Mole. Swords, MD

## 2010-12-26 NOTE — Op Note (Signed)
NAMEMARIXA, MELLOTT                  ACCOUNT NO.:  [id number]   MEDICAL RECORD NO.:  [id number]          PATIENT TYPE:  AMB   LOCATION:  ENDO                         FACILITY:  [facility]   PHYSICIAN:  Petra Kuba, M.D.    DATE OF BIRTH:  Oct 10, 1928   DATE OF PROCEDURE:  01/06/2005  DATE OF DISCHARGE:                                 OPERATIVE REPORT   PROCEDURE:  Colonoscopy.   INDICATIONS FOR PROCEDURE:  History of colon polyps due for repeat  screening.   Consent was signed after risks, benefits, methods, and options were  thoroughly discussed multiple times in the past.   MEDICINES USED:  Demerol 70, Versed 6.   DESCRIPTION OF PROCEDURE:  Rectal inspection was pertinent for external  hemorrhoids, small. Digital exam was negative. The video pediatric  adjustable colonoscope was inserted and with some difficulty due to a long  looping colon with some tortuosity with rolling her on her back and various  abdominal pressures, we were able to advance to the cecum. Other than the  left greater than right diverticula, no abnormalities were seen on  insertion.  The cecum was identified by the appendiceal orifice and the  ileocecal valve. Unfortunately, there was some formed stool in the cecum  which could not be washed off. The scope was slowly withdrawn. The prep was  fairly adequate. Did require lots of washing and suctioning again for fairly  adequate visualization.  There were some stool balls and stool adherent to  the wall which could not be washed off.  On slow withdrawal back to the  rectum, other than the left greater than right diverticula, no abnormalities  were seen. Specifically, no polyps, tumors or masses. Once back in the  rectum, anal rectal pullthrough and retroflexion confirmed some small  hemorrhoids. The scope was straightened and readvanced a short ways up the  left side of the colon, air was suctioned, scope removed. The patient  tolerated the procedure adequately.  There was no obvious or immediate  complications.   ENDOSCOPIC DIAGNOSIS:  1. Internal and external hemorrhoids.  2. Left greater than rare right diverticula.  3. Long looping tortuous colon.  4. Otherwise within normal limits to the cecum.     PLAN:  Consider a virtual colonoscopy in five years when screening is  needed if doing well medically and widely accepted and covered by insurance.  Continue working with an EGD with dilatation.      MEM/MEDQ  D:  01/06/2005  T:  01/06/2005  Job:  [id number]   cc:   Valetta Mole. Swords, M.D. [facility]

## 2010-12-26 NOTE — Assessment & Plan Note (Signed)
HEALTHCARE                               PULMONARY OFFICE NOTE   NAME:[name], [name]                         MRN:          [id number]  DATE:03/04/2006                            DOB:          [birth year]    PRIMARY CARE PHYSICIAN:  Valetta Mole. Swords, MD   PROBLEMS:  Sleep medicine consultation at the kind request of Dr. Cato [name] for  this 76 year old woman with sleep apnea and restless leg syndrome.   HISTORY OF PRESENT ILLNESS:  She describes persistent difficult initiating  and maintaining sleep which she says began when her husband was ill with  emphysema and she was also caring for a sick parent.  Her husband died some  years ago, so this has gone on at least five years.  She says she does not  sleep without medication.  At first, she used Amitriptyline, but thought it  dropped her memory, so she was changed to Ambien CR briefly with Mirapex and  Advil.  She also dropped the Ambien because of memory concerns, but says  sleep onset is better now.  She still cannot stay awake with a persistent  tendency to wake around 3 a.m.  What she has settled on is to take a quarter  of an Ambien 12.5 mg CR around 3 a.m.  She notices little daytime sleep with  this, but some days has some malaise through the day that she relates to the  Ambien.  She usually gets about 2 more hours of sleep after she takes the  Ambien at 3 a.m.  She is aware of loud snoring.  Bedtime is between 10:30  and 10:45, estimating anywhere from 30 minutes to 2 hours for sleep onset  and waking repeatedly through the night.  Sometimes if she waits at 3 a.m.,  she will stay up.  Other times, she will get up around 5:30 a.m.  An  occasional daytime nap will help.  A nocturnal polysomnogram was done at the  [facility] on November 12, 2005, recording mild to moderate obstructive  apnea with an index of 19 per hour, oxygen desaturation to 86%.  She was  also noted to have very large numbers  of leg jerks with sleep disturbance  consistent with restless leg syndrome.   MEDICATIONS:  1.  Micardis 80/12.5 one half daily.  2.  Nexium 40 mg.  3.  Synthroid 150 mcg.  4.  Mirapex 0.125 mg.  5.  Hyoscyamine p.r.n.  6.  Gaviscon.   ALLERGIES:  DRUG INTOLERANCE TO LACTOSE.  She uses an occasional albuterol  inhaler and the fractions of an Ambien CR 12.5 mg tablet is described in the  HPI.   REVIEW OF SYSTEMS:  Weight has gone up about 10 pounds in recent years.  No  significant headache.  No syncope, no chest pain or palpitation.  Occasional  heartburn.   PAST MEDICAL HISTORY:  1.  Bursitis of the left hip.  2.  Restless legs unless she takes Mirapex.  3.  Hypothyroid.  4.  Hypertension.  5.  Asthma.  6.  Obstructive sleep apnea.  7.  No history of central nervous system disorder or ENT surgery.  She has      had cholecystectomy, hernia repair and hysterectomy.   FAMILY HISTORY:  She is not aware of others in the family with sleep  disorder.   OBJECTIVE:  VITAL SIGNS:  Weight 190 pounds, blood pressure 148/92, pulse  regular and 61, room air saturation 97%.  GENERAL:  This is an alert, mildly obese woman.  NEUROLOGICAL:  Unremarkable to observation with no restlessness or tremor.  HEENT:  Long palate 3-4/4 without stridor.  Nasal airway is unobstructed.  Neck veins are not distended.  CHEST:  Quite clear lung fields with unlabored breathing.  HEART:  Sounds are regular without murmurs, rubs or gallops.  ABDOMEN:  No enlargement of liver or spleen.  EXTREMITIES:  No cyanosis, clubbing or edema.   IMPRESSION:  1.  Obstructive sleep apnea with insomnia (AHI 19 per hour on November 12, 2005.  2.  Periodic limb movement/restless legs with an arousal index of 17 per      hour.   PLAN:  1.  We discussed patterns of good sleep hygiene and reasonable expectations.  2.  We discussed the medical concerns and available treatments for sleep      apnea.  Despite her reservations,  she accepted a trial of CPAP by auto      titration.  3.  May try suppressing both her limb jerks and unprotected frequent      waking's using Clonazepam 0.5 mg one half to one q.h.s. instead of      Mirapex.  This may calm the leg jerks and consolidate sleep, but it may      also carry over to long into the next morning.  She understands, and we      will just have to see what it does.  4.  Schedule return in one month, earlier p.r.n.                                   Clinton D. Maple Hudson, MD, FCCP, FACP   CDY/MedQ  DD:  03/04/2006  DT:  03/04/2006  Job #:  [id number]   cc:   Valetta Mole. Swords, MD  [facility]

## 2010-12-26 NOTE — Procedures (Signed)
[name], [name]                  ACCOUNT NO.:  [id number]   MEDICAL RECORD NO.:  [id number]          PATIENT TYPE:  OUT   LOCATION:  SLEEP CENTER                 FACILITY:  [facility]   PHYSICIAN:  Marcelyn Bruins, M.D. [facility] DATE OF BIRTH:  May 29, 1929   DATE OF STUDY:  11/12/2005                              NOCTURNAL POLYSOMNOGRAM   REFERRING PHYSICIAN:  Bruce H. Swords, M.D.   INDICATION FOR STUDY:  Persistent disorder of initiating and maintaining  sleep.  Epworth score:  11.  Sleep architecture:  The patient had a total  sleep time of 281 minutes with adequate slow wave sleep for her age but  decreased REMARKABLE.  Sleep onset latency was very prolonged at 86 minutes  and REM onset was also prolonged at 225 minutes.  Sleep efficiency was very  decreased to 64%.   RESPIRATORY DATA:  The patient was found to have 55 hypopneas and 34 apneas  for a respiratory disturbance index of 19 events per hour.  Events were not  positional but there was moderate to loud snoring noted throughout.   OXYGEN DATA:  There was O2 desaturation as low as 86% with the patient's  obstructive events.   CARDIAC DATA:  No clinically significant cardiac arrhythmia.   MOVEMENT/PARASOMNIA:  The patient was found to have 580 leg jerks with 17  per hour resulting in arousal or awakening.   IMPRESSION/RECOMMENDATIONS:  1.  Mild to moderate obstructive sleep apnea with a respiratory disturbance      index of 19 events per hour and O2 desaturation as low as 86%.      Treatment for this degree of sleep apnea may include weight loss alone      if applicable, upper airway surgery, oral appliance, and also CPAP.  2.  Very large numbers of leg jerks with significant sleep disruption.      Although I cannot exclude that these are related to her sleep disorder      breathing, the shear numbers of leg jerks are usually associated with a      primary movement disorder such as the restless leg syndrome and the      periodic  leg movement syndrome.  Clinical correlation is suggested.           ______________________________  Marcelyn Bruins, M.D. [facility]  Diplomate, American Board of Sleep  Medicine     KC/MEDQ  D:  12/03/2005 15:59:27  T:  12/04/2005 10:35:53  Job:  [id number]

## 2010-12-26 NOTE — Procedures (Signed)
[facility]  Patient:    [name], [name]                         MRN: [id number] Proc. Date: 09/10/00 Adm. Date:  [id number] Disc. Date: [id number] Attending:  Nelda Marseille CC:         Valetta Mole. Swords, M.D. [facility]  Javier Docker, M.D.   Procedure Report  PROCEDURE:  Colonoscopy and polypectomy.  SURGEON:  Petra Kuba, M.D.  INDICATIONS:  Patient with a known history of diverticulosis, now with increasing constipation.  Due for colonic screening.  Consent was signed after risks, benefits, methods, and options were thoroughly discussed in the office on multiple occasions.  MEDICINES USED:  Demerol 60 and Versed 8.  DESCRIPTION OF PROCEDURE:  Rectal inspection was pertinent for external hemorrhoids.  Digital exam was negative.  The video colonoscope was inserted and with some difficulty due to a tortuous sigmoid filled with diverticula, it was advanced to about 40 cm.  At that point, there was a difficult turn.  The scope began to loop, and despite rolling her on her back and various abdominal pressures, we were unable to advance any further.  We elected to withdrawn.  The scope was withdrawn back to the rectum.  Other then diverticula, no other abnormalities were seen.  The scope was retroflexed at this junction and pertinent for some internal hemorrhoids.  The scope was straightened and removed.  The pediatric video colonoscope was inserted and easier advanced around the colon to the mid transverse.  At that point, the scope began to loop, and various abdominal pressures and rolling her on her back were applied.  We were able to advance to the proximal wall of the hepatic flexure.  Again, with increased looping and tortuosity, we rolled her on her right side and we were able to advance to the cecal pole which was identified by the appendiceal orifice.  We believed we saw the ileocecal valve, although the scope would not stand this position  very long and to withdraw back quickly to the hepatic flexure.  We readvanced multiple times to the cecum.  We did not see any abnormalities on insertion and did not see any obvious other lumen.  As mentioned earlier, we believe the bowel was seen fairly quickly based on a very tortuous ascending colon and cecum.  The prep was only fair as the scope was slowly withdrawn.  Did require lots in washing and suctioning for adequate visualization.  There was formed stool due to the pandiverticulosis, moderate to severe throughout which did make missing small lesions possible.  We did see in the mid transverse a 4 mm polyp which was snared, electrocautery applied, and the polyp was suctioned through the scope, and collected in the trap.  On slow withdrawal, no other abnormalities were seen, but the significant diverticula.  Once back in the rectum, the scope was reretroflexed.  Anorectal pull through was then performed which confirmed the hemorrhoids.  The scope was was reinserted a short ways up the sigmoid, air was suctioned, and the scope removed.  The patient tolerated the procedure adequately.  There was no obvious or immediate complication.  ENDOSCOPIC DIAGNOSES: 1. Internal and external hemorrhoids. 2. Moderate to severe diverticula throughout. 3. Fair prep and tortuosity. 4. Unable to advance the regular scope, but able to advance the pediatric    colonoscope until we believed we were at the cecum. 5. A 4 mm  mid transverse polyp, status post snare. 6. Otherwise within normal limits as above.  PLAN:  Await pathology to determine future colonic screening.  GI followup p.r.n. or in three months to recheck symptoms and make sure no further workup plans are needed. DD:  09/10/00 TD:  09/13/00 Job: [id number]

## 2010-12-26 NOTE — Assessment & Plan Note (Signed)
[facility]                               PULMONARY OFFICE NOTE   NAME:[name], [name]                         MRN:          [id number]  DATE:05/18/2006                            DOB:          1928-09-22    PROBLEM:  1. Obstructive sleep apnea with insomnia.  2. Periodic limb movement/restless legs.  3. History of asthma.   HISTORY:  She continues CPAP at [facility]  Her main concern is with her  restless legs and her arthritic discomforts.  She had called in September to  report that clonazepam 0.5 mg worked well.  She says now that a whole  milligram carries over too long.  If she takes Advil to relieve her low back  and hip pains along with the clonazepam, she gets leg cramps that she does  not get with clonazepam alone, but she feels she needs something for the  arthritic pains.  I suggested Tylenol, and she says now that is not an  inflammatory and not good enough.  Mirapex, she thought, made her cramps and  leg jerks worse.  She thinks the clonazepam works best to control the leg  jerks, but seems to aggravate at least some of the pains, perhaps from lying  still for longer periods of time.  On some nights, instead of clonazepam,  she has taken a fragment of an Ambien CR 12.5.  With clonazepam, she says  she sleep until 4:00 AM, and then she will take a fragment of the Ambien.  I  discussed Ambien with her as a sustained release product, not designed to be  fragmented, and suggested that we try a generic Ambien regular release form  for her to break.  She wants to keep on doing what she is doing.  For her  asthma, she says she has tried Advair 100, Proventil and albuterol.  She  feels her control is fairly good and just wants her Proventil HFA inhaler.  Overall, she was really pretty specific about what she wanted to use even  though she gave me the impression she was not entirely as comfortable as she  would like with symptom control.   MEDICATIONS:  1. Micardis 80/12.5, 1/2 tab daily.  2. Nexium 40 mg.  3. Synthroid 150 mcg.  4. Hyoscyamine 37.5 mg p.r.n.  5. Gaviscon.  6. Clonazepam 0.5 mg.  7. CPAP at [facility]  8. Albuterol inhaler.  9. Ambien CR 12.5.   ALLERGIES:  DRUG INTOLERANT TO LACTOSE.  Otherwise, no medication allergy.   OBJECTIVE:  VITAL SIGNS:  Weight 190 pounds.  Blood pressure 150/82.  Pulse  regular at 63.  Room air saturation 96%.  GENERAL:  She was not restless at all.  She seemed a little frustrated, and  I made some extra effort to try to help her talk through her choices and  preferences.  LUNG FIELDS:  Clear.  HEART:  Sounds regular without murmur.  There was no stridor.  No neck vein  distention.   IMPRESSION:  1. Obstructive sleep apnea, controlled.  2. There is an  insomnia component related to restless leg discomforts,      which may be less well controlled.  3. Mild intermittent asthma.   PLAN:  1. We walked through a lot of options, and she is going to call if she      wants to make changes.  2. Refilled Proventil HFA with discussion.  3. Flu vaccine was discussed and given.  4. Schedule return in 1 year, earlier p.r.n.       Clinton D. Maple Hudson, MD, FCCP, FACP      CDY/MedQ  DD:  05/18/2006  DT:  05/20/2006  Job #:  [id number]   cc:   Valetta Mole. Swords, MD

## 2011-01-09 ENCOUNTER — Telehealth: Payer: Self-pay | Admitting: Internal Medicine

## 2011-01-09 NOTE — Telephone Encounter (Signed)
Pt is out of town and is req a partial supply of Zolpidem 12.5 mg. Pt will call to give pharmacy info.

## 2011-01-09 NOTE — Telephone Encounter (Signed)
Pt is on her way to [place] and left her ambien at home.  She wanted 3 pill called into [facility] [phone].  Per Dr Cato Mulligan verbal ok, rx called in pt aware

## 2011-03-03 ENCOUNTER — Other Ambulatory Visit: Payer: Self-pay | Admitting: Family Medicine

## 2011-03-03 DIAGNOSIS — Z1231 Encounter for screening mammogram for malignant neoplasm of breast: Secondary | ICD-10-CM

## 2011-03-04 ENCOUNTER — Ambulatory Visit (INDEPENDENT_AMBULATORY_CARE_PROVIDER_SITE_OTHER): Payer: Medicare Other | Admitting: Family Medicine

## 2011-03-04 ENCOUNTER — Encounter: Payer: Self-pay | Admitting: Family Medicine

## 2011-03-04 VITALS — BP 140/60 | Temp 98.3°F | Wt 153.0 lb

## 2011-03-04 DIAGNOSIS — J310 Chronic rhinitis: Secondary | ICD-10-CM

## 2011-03-04 MED ORDER — FLUTICASONE PROPIONATE 50 MCG/ACT NA SUSP: 2.0000 | Freq: Every day | NASAL | Status: DC

## 2011-03-04 NOTE — Progress Notes (Signed)
  Subjective:    Patient ID: [name], female    DOB: Oct 21, 1928, 77 y.o.   MRN: [id number]  HPI Patient seen with nasal congestion 2-3 week duration. Right naris greater than left. No history of polyps. No active discharge. No fever or chills. Denies headaches. No alleviating factors. Symptoms are progressive and moderate severity. No post nasal drip symptoms.  Chronic problems include hypothyroidism, recent mild elevation of creatinine with subsequent improvement followed by nephrology. Hypertension with recent increase amlodipine 5 mg daily and improvement in blood pressures.   Review of Systems  Constitutional: Negative for fever and chills.  HENT: Positive for congestion. Negative for sore throat, rhinorrhea, sneezing, postnasal drip and sinus pressure.   Respiratory: Negative for cough and shortness of breath.   Cardiovascular: Negative for chest pain.  Hematological: Negative for adenopathy.       Objective:   Physical Exam  Constitutional: She appears well-developed and well-nourished. No distress.  HENT:  Right Ear: External ear normal.  Left Ear: External ear normal.  Mouth/Throat: Oropharynx is clear and moist.       Somewhat prominent and swollen nasal mucosa right and left. No visible polyps. No purulent secretions.  Cardiovascular: Normal rate and regular rhythm.   Pulmonary/Chest: Effort normal and breath sounds normal. No respiratory distress. She has no wheezes. She has no rales.          Assessment & Plan:  Nasal congestion. Avoid decongestants with age and medical history. Question allergic versus nonallergic rhinitis. Trial of Flonase 2 sprays per nostril once daily

## 2011-03-12 ENCOUNTER — Ambulatory Visit (HOSPITAL_COMMUNITY)
Admission: RE | Admit: 2011-03-12 | Discharge: 2011-03-12 | Disposition: A | Discharge status: Home / Self Care | Payer: Medicare Other | Source: Ambulatory Visit | Attending: Family Medicine | Admitting: Family Medicine

## 2011-03-12 DIAGNOSIS — Z1231 Encounter for screening mammogram for malignant neoplasm of breast: Secondary | ICD-10-CM

## 2011-03-31 ENCOUNTER — Ambulatory Visit (INDEPENDENT_AMBULATORY_CARE_PROVIDER_SITE_OTHER): Payer: Medicare Other | Admitting: Internal Medicine

## 2011-03-31 ENCOUNTER — Encounter: Payer: Self-pay | Admitting: Internal Medicine

## 2011-03-31 VITALS — BP 128/70 | HR 72 | Temp 98.5°F | Ht 62.0 in | Wt 153.0 lb

## 2011-03-31 DIAGNOSIS — N289 Disorder of kidney and ureter, unspecified: Secondary | ICD-10-CM | POA: Insufficient documentation

## 2011-03-31 DIAGNOSIS — F329 Major depressive disorder, single episode, unspecified: Secondary | ICD-10-CM

## 2011-03-31 DIAGNOSIS — E039 Hypothyroidism, unspecified: Secondary | ICD-10-CM

## 2011-03-31 DIAGNOSIS — I10 Essential (primary) hypertension: Secondary | ICD-10-CM

## 2011-03-31 DIAGNOSIS — D638 Anemia in other chronic diseases classified elsewhere: Secondary | ICD-10-CM

## 2011-03-31 LAB — TSH: TSH: 1.68 u[IU]/mL (ref 0.35–5.50)

## 2011-03-31 MED ORDER — AMLODIPINE BESYLATE 5 MG PO TABS: 5.0000 mg | ORAL_TABLET | Freq: Every day | ORAL | Status: DC

## 2011-03-31 MED ORDER — ZOLPIDEM TARTRATE 10 MG PO TABS: 10.0000 mg | ORAL_TABLET | Freq: Every evening | ORAL | Status: DC | PRN

## 2011-03-31 NOTE — Assessment & Plan Note (Signed)
She feels well and is not requiring any medications.

## 2011-03-31 NOTE — Assessment & Plan Note (Signed)
Needs followup. Check laboratory work today.

## 2011-03-31 NOTE — Progress Notes (Signed)
  Subjective:    Patient ID: [name], female    DOB: 03/31/1929, 77 y.o.   MRN: [id number]  HPI  Patient Active Problem List  Diagnoses  . HYPOTHYROIDISM---needs f/u  . ANEMIA CHRONIC DISEASE OTHER--says labs drawn at "[facility]"  . DEPRESSION---curently on no meds  . HYPERTENSION--tolerating meds, note increased dose of amlodipine  .   .   .   . Renal insufficiency--- she tells me that she has seen nephrologist and labs were drawn there   Past Medical History  Diagnosis Date  . HYPOTHYROIDISM 04/04/2007  . HYPERTENSION 04/04/2007  . Chronic fatigue   . Depression   . Anemia    Past Surgical History  Procedure Date  . Appendectomy   . Cholecystectomy   . Hernia repair   . Abdominal hysterectomy   . Spine surgery 2011    cervical  . Oophorectomy   . Back surgery 2012  . Orif tibia fracture 2012    dr duda    reports that she quit smoking about 41 years ago. She does not have any smokeless tobacco history on file. Her alcohol and drug histories not on file. family history includes Cancer (age of onset:54) in her mother and Heart disease in her mother. Allergies  Allergen Reactions  . Oxycodone Hcl     REACTION: itching     Review of Systems  patient denies chest pain, shortness of breath, orthopnea. Denies lower extremity edema, abdominal pain, change in appetite, change in bowel movements. Patient denies rashes, musculoskeletal complaints. No other specific complaints in a complete review of systems.      Objective:   Physical Exam  Well-developed well-nourished female in no acute distress. HEENT exam atraumatic, normocephalic, extraocular muscles are intact. Neck is supple. No jugular venous distention no thyromegaly. Chest clear to auscultation without increased work of breathing. Cardiac exam S1 and S2 are regular. Abdominal exam active bowel sounds, soft, nontender. Extremities no edema. Neurologic exam she is alert without any motor sensory deficits. Gait  is normal.        Assessment & Plan:

## 2011-03-31 NOTE — Assessment & Plan Note (Signed)
Adequate control. Continue current medications. 

## 2011-03-31 NOTE — Assessment & Plan Note (Signed)
Reviewed laboratory from [facility]. No followup necessary.

## 2011-05-12 ENCOUNTER — Other Ambulatory Visit: Payer: Self-pay | Admitting: *Deleted

## 2011-05-12 MED ORDER — LEVOTHYROXINE SODIUM 125 MCG PO TABS: 125.0000 ug | ORAL_TABLET | Freq: Every day | ORAL | Status: DC

## 2011-05-12 MED ORDER — TELMISARTAN-HCTZ 80-12.5 MG PO TABS: 1.0000 | ORAL_TABLET | Freq: Every day | ORAL | Status: DC

## 2011-07-28 ENCOUNTER — Ambulatory Visit (INDEPENDENT_AMBULATORY_CARE_PROVIDER_SITE_OTHER): Payer: Medicare Other | Admitting: Family Medicine

## 2011-07-28 ENCOUNTER — Encounter: Payer: Self-pay | Admitting: Family Medicine

## 2011-07-28 VITALS — BP 140/68 | HR 95 | Temp 98.6°F | Wt 154.0 lb

## 2011-07-28 DIAGNOSIS — J069 Acute upper respiratory infection, unspecified: Secondary | ICD-10-CM

## 2011-07-28 MED ORDER — GUAIFENESIN-CODEINE 100-10 MG/5ML PO SYRP: 5.0000 mL | ORAL_SOLUTION | Freq: Three times a day (TID) | ORAL | Status: AC | PRN

## 2011-07-28 NOTE — Progress Notes (Signed)
Subjective:    Patient ID: [name], female    DOB: [birth year], 77 y.o.   MRN: [id number]  HPI 77 year old white female, in with 4 days of cough, chest congestion. Cough is productive of clear yellow sputum. She's been using nasal saline that has helped some hasn't used any other over-the-counter medications. Symptoms appear to be improving. She denies any fever, muscle aches or pain, nausea or vomiting, diarrhea, shortness of breath, or chest pain.   Review of Systems As stated above    Past Medical History  Diagnosis Date  . HYPOTHYROIDISM 04/04/2007  . HYPERTENSION 04/04/2007  . Chronic fatigue   . Depression   . Anemia     History   Social History  . Marital Status: Widowed    Spouse Name: N/A    Number of Children: N/A  . Years of Education: N/A   Occupational History  . Not on file.   Social History Main Topics  . Smoking status: Former Smoker -- 1.0 packs/day for 7 years    Quit date: 10/13/1969  . Smokeless tobacco: Not on file  . Alcohol Use: Not on file  . Drug Use: Not on file  . Sexually Active: Not on file   Other Topics Concern  . Not on file   Social History Narrative  . No narrative on file    Past Surgical History  Procedure Date  . Appendectomy   . Cholecystectomy   . Hernia repair   . Abdominal hysterectomy   . Spine surgery 2011    cervical  . Oophorectomy   . Back surgery 2012  . Orif tibia fracture 2012    dr duda    Family History  Problem Relation Age of Onset  . Heart disease Mother   . Cancer Mother 22    renal cell CA    Allergies  Allergen Reactions  . Oxycodone Hcl     REACTION: itching    Current Outpatient Prescriptions on File Prior to Visit  Medication Sig Dispense Refill  . amLODipine (NORVASC) 5 MG tablet Take 1 tablet (5 mg total) by mouth daily.  90 tablet  3  . calcium carbonate (OS-CAL) 1250 MG chewable tablet Chew 1 tablet by mouth daily.        . clidinium-chlordiazePOXIDE (LIBRAX) 2.5-5 MG per  capsule 2 (two) times daily as needed.       Marland Kitchen levothyroxine (SYNTHROID, LEVOTHROID) 125 MCG tablet Take 1 tablet (125 mcg total) by mouth daily.  90 tablet  3  . telmisartan-hydrochlorothiazide (MICARDIS HCT) 80-12.5 MG per tablet Take 1 tablet by mouth daily.  90 tablet  3  . Cyanocobalamin (VITAMIN B-12) 1000 MCG SUBL Place under the tongue daily.       . fluticasone (FLONASE) 50 MCG/ACT nasal spray Place 2 sprays into the nose daily.  16 g  2   Current Facility-Administered Medications on File Prior to Visit  Medication Dose Route Frequency Provider Last Rate Last Dose  . promethazine (PHENERGAN) injection 25 mg  25 mg Intramuscular Q6H PRN Bruce W Burchette   25 mg at 10/14/10 1313    BP 140/68  Pulse 95  Temp(Src) 98.6 F (37 C) (Oral)  Wt 154 lb (69.854 kg)chart Objective:   Physical Exam Constitutional: Alert and oriented in no acute distress ENT: Ears are clear bilaterally. Pharynx normal normal dentition. No sinus tenderness to palpation. No lymphadenopathy Lungs: Clear to auscultation Cardiac regular rate and rhythm Skin: Warm and dry. No cyanosis Psychiatric: Normal  mood and affect       Assessment & Plan:  Assessment: Acute upper respiratory infection  Plan: Allegra OTC once daily. Robitussin-AC 1 teaspoon 3 times a day when necessary cough. Rest. Drink plenty of fluids. Call if symptoms worsen or persist. Recheck as scheduled or sooner when necessary

## 2011-07-28 NOTE — Patient Instructions (Signed)
1. OTC Allegra or Zyrtec once daily.   Upper Respiratory Infection, Adult An upper respiratory infection (URI) is also sometimes known as the common cold. The upper respiratory tract includes the nose, sinuses, throat, trachea, and bronchi. Bronchi are the airways leading to the lungs. Most people improve within 1 week, but symptoms can last up to 2 weeks. A residual cough may last even longer.  CAUSES Many different viruses can infect the tissues lining the upper respiratory tract. The tissues become irritated and inflamed and often become very moist. Mucus production is also common. A cold is contagious. You can easily spread the virus to others by oral contact. This includes kissing, sharing a glass, coughing, or sneezing. Touching your mouth or nose and then touching a surface, which is then touched by another person, can also spread the virus. SYMPTOMS  Symptoms typically develop 1 to 3 days after you come in contact with a cold virus. Symptoms vary from person to person. They may include:  Runny nose.   Sneezing.   Nasal congestion.   Sinus irritation.   Sore throat.   Loss of voice (laryngitis).   Cough.   Fatigue.   Muscle aches.   Loss of appetite.   Headache.   Low-grade fever.  DIAGNOSIS  You might diagnose your own cold based on familiar symptoms, since most people get a cold 2 to 3 times a year. Your caregiver can confirm this based on your exam. Most importantly, your caregiver can check that your symptoms are not due to another disease such as strep throat, sinusitis, pneumonia, asthma, or epiglottitis. Blood tests, throat tests, and X-rays are not necessary to diagnose a common cold, but they may sometimes be helpful in excluding other more serious diseases. Your caregiver will decide if any further tests are required. RISKS AND COMPLICATIONS  You may be at risk for a more severe case of the common cold if you smoke cigarettes, have chronic heart disease (such as  heart failure) or lung disease (such as asthma), or if you have a weakened immune system. The very young and very old are also at risk for more serious infections. Bacterial sinusitis, middle ear infections, and bacterial pneumonia can complicate the common cold. The common cold can worsen asthma and chronic obstructive pulmonary disease (COPD). Sometimes, these complications can require emergency medical care and may be life-threatening. PREVENTION  The best way to protect against getting a cold is to practice good hygiene. Avoid oral or hand contact with people with cold symptoms. Wash your hands often if contact occurs. There is no clear evidence that vitamin C, vitamin E, echinacea, or exercise reduces the chance of developing a cold. However, it is always recommended to get plenty of rest and practice good nutrition. TREATMENT  Treatment is directed at relieving symptoms. There is no cure. Antibiotics are not effective, because the infection is caused by a virus, not by bacteria. Treatment may include:  Increased fluid intake. Sports drinks offer valuable electrolytes, sugars, and fluids.   Breathing heated mist or steam (vaporizer or shower).   Eating chicken soup or other clear broths, and maintaining good nutrition.   Getting plenty of rest.   Using gargles or lozenges for comfort.   Controlling fevers with ibuprofen or acetaminophen as directed by your caregiver.   Increasing usage of your inhaler if you have asthma.  Zinc gel and zinc lozenges, taken in the first 24 hours of the common cold, can shorten the duration and lessen the severity  of symptoms. Pain medicines may help with fever, muscle aches, and throat pain. A variety of non-prescription medicines are available to treat congestion and runny nose. Your caregiver can make recommendations and may suggest nasal or lung inhalers for other symptoms.  HOME CARE INSTRUCTIONS   Only take over-the-counter or prescription medicines for  pain, discomfort, or fever as directed by your caregiver.   Use a warm mist humidifier or inhale steam from a shower to increase air moisture. This may keep secretions moist and make it easier to breathe.   Drink enough water and fluids to keep your urine clear or pale yellow.   Rest as needed.   Return to work when your temperature has returned to normal or as your caregiver advises. You may need to stay home longer to avoid infecting others. You can also use a face mask and careful hand washing to prevent spread of the virus.  SEEK MEDICAL CARE IF:   After the first few days, you feel you are getting worse rather than better.   You need your caregiver's advice about medicines to control symptoms.   You develop chills, worsening shortness of breath, or brown or red sputum. These may be signs of pneumonia.   You develop yellow or brown nasal discharge or pain in the face, especially when you bend forward. These may be signs of sinusitis.   You develop a fever, swollen neck glands, pain with swallowing, or white areas in the back of your throat. These may be signs of strep throat.  SEEK IMMEDIATE MEDICAL CARE IF:   You have a fever.   You develop severe or persistent headache, ear pain, sinus pain, or chest pain.   You develop wheezing, a prolonged cough, cough up blood, or have a change in your usual mucus (if you have chronic lung disease).   You develop sore muscles or a stiff neck.  Document Released: 01/20/2001 Document Revised: 04/08/2011 Document Reviewed: 11/28/2010 [facility] Patient Information 2012 [place], [place].

## 2011-08-03 ENCOUNTER — Telehealth: Payer: Self-pay

## 2011-08-03 NOTE — Telephone Encounter (Signed)
Per Dr Clent Ridges, get some rest, drink fluids and tylenol for fever.  Pt aware

## 2011-08-03 NOTE — Telephone Encounter (Signed)
Pt states she has been taking the Robutussin AC syrup and allegra.  Pt has a fever of 99.0. Pt states she is better but still does not feel 100 percent.  Pt request an antibiotic be sent to pharmacy.  Pls advise.

## 2011-09-04 ENCOUNTER — Telehealth: Payer: Self-pay | Admitting: *Deleted

## 2011-09-04 NOTE — Telephone Encounter (Signed)
Sinus infection x 3 weeks, and is coughing all night (non productive), and needs RX called to pharmacy.

## 2011-09-05 NOTE — Telephone Encounter (Signed)
Schedule OV with any provider 

## 2011-09-07 ENCOUNTER — Ambulatory Visit (INDEPENDENT_AMBULATORY_CARE_PROVIDER_SITE_OTHER)
Admission: RE | Admit: 2011-09-07 | Discharge: 2011-09-07 | Disposition: A | Discharge status: Home / Self Care | Payer: Medicare Other | Source: Ambulatory Visit | Attending: Internal Medicine | Admitting: Internal Medicine

## 2011-09-07 ENCOUNTER — Ambulatory Visit (INDEPENDENT_AMBULATORY_CARE_PROVIDER_SITE_OTHER): Payer: Medicare Other | Admitting: Internal Medicine

## 2011-09-07 VITALS — BP 140/62 | Temp 98.4°F | Wt 162.0 lb

## 2011-09-07 DIAGNOSIS — Z23 Encounter for immunization: Secondary | ICD-10-CM

## 2011-09-07 DIAGNOSIS — R05 Cough: Secondary | ICD-10-CM

## 2011-09-07 DIAGNOSIS — R059 Cough, unspecified: Secondary | ICD-10-CM

## 2011-09-07 DIAGNOSIS — J984 Other disorders of lung: Secondary | ICD-10-CM | POA: Diagnosis not present

## 2011-09-07 DIAGNOSIS — D71 Functional disorders of polymorphonuclear neutrophils: Secondary | ICD-10-CM | POA: Diagnosis not present

## 2011-09-07 MED ORDER — DOXYCYCLINE HYCLATE 100 MG PO TABS: 100.0000 mg | ORAL_TABLET | Freq: Two times a day (BID) | ORAL | Status: AC

## 2011-09-07 NOTE — Telephone Encounter (Signed)
Appt made with Dr. Cato Mulligan for this am.

## 2011-09-07 NOTE — Progress Notes (Signed)
Patient ID: [name], female   DOB: 09-Mar-1929, 78 y.o.   MRN: [id number]  Cough for 3-4 weeks- She describes dry, hacking cough for 3- weeks. Reviewed padonda's note from December.  No fever or chills, no wheeze.   Past Medical History  Diagnosis Date  . HYPOTHYROIDISM 04/04/2007  . HYPERTENSION 04/04/2007  . Chronic fatigue   . Depression   . Anemia     History   Social History  . Marital Status: Widowed    Spouse Name: N/A    Number of Children: N/A  . Years of Education: N/A   Occupational History  . Not on file.   Social History Main Topics  . Smoking status: Former Smoker -- 1.0 packs/day for 7 years    Quit date: 10/13/1969  . Smokeless tobacco: Not on file  . Alcohol Use: Not on file  . Drug Use: Not on file  . Sexually Active: Not on file   Other Topics Concern  . Not on file   Social History Narrative  . No narrative on file    Past Surgical History  Procedure Date  . Appendectomy   . Cholecystectomy   . Hernia repair   . Abdominal hysterectomy   . Spine surgery 2011    cervical  . Oophorectomy   . Back surgery 2012  . Orif tibia fracture 2012    dr duda    Family History  Problem Relation Age of Onset  . Heart disease Mother   . Cancer Mother 4    renal cell CA    Allergies  Allergen Reactions  . Oxycodone Hcl     REACTION: itching    Current Outpatient Prescriptions on File Prior to Visit  Medication Sig Dispense Refill  . amLODipine (NORVASC) 5 MG tablet Take 1 tablet (5 mg total) by mouth daily.  90 tablet  3  . calcium carbonate (OS-CAL) 1250 MG chewable tablet Chew 1 tablet by mouth daily.        . clidinium-chlordiazePOXIDE (LIBRAX) 2.5-5 MG per capsule 2 (two) times daily as needed.       Marland Kitchen levothyroxine (SYNTHROID, LEVOTHROID) 125 MCG tablet Take 1 tablet (125 mcg total) by mouth daily.  90 tablet  3  . telmisartan-hydrochlorothiazide (MICARDIS HCT) 80-12.5 MG per tablet Take 1 tablet by mouth daily.  90 tablet  3    Current Facility-Administered Medications on File Prior to Visit  Medication Dose Route Frequency Provider Last Rate Last Dose  . promethazine (PHENERGAN) injection 25 mg  25 mg Intramuscular Q6H PRN Kristian Covey, MD   25 mg at 10/14/10 1313     patient denies chest pain, shortness of breath, orthopnea. Denies lower extremity edema, abdominal pain, change in appetite, change in bowel movements. Patient denies rashes, musculoskeletal complaints. No other specific complaints in a complete review of systems.   BP 140/62  Temp(Src) 98.4 F (36.9 C) (Oral)  Wt 162 lb (73.483 kg)  Well-developed well-nourished female in no acute distress. HEENT exam atraumatic, normocephalic, extraocular muscles are intact. Neck is supple. No jugular venous distention no thyromegaly. Chest clear to auscultation without increased work of breathing. Cardiac exam S1 and S2 are regular.   Cough---unclear etiology. ? Atypical bronchitis Given duration of cough---trial doxycycline Check CXR

## 2011-09-10 ENCOUNTER — Telehealth: Payer: Self-pay | Admitting: *Deleted

## 2011-09-10 NOTE — Telephone Encounter (Signed)
Pt is calling for chest xray results, please.  Pt was given results.

## 2011-09-19 DIAGNOSIS — Z23 Encounter for immunization: Secondary | ICD-10-CM | POA: Diagnosis not present

## 2012-01-29 ENCOUNTER — Telehealth: Payer: Self-pay | Admitting: Internal Medicine

## 2012-01-29 ENCOUNTER — Encounter: Payer: Self-pay | Admitting: Internal Medicine

## 2012-01-29 ENCOUNTER — Ambulatory Visit (INDEPENDENT_AMBULATORY_CARE_PROVIDER_SITE_OTHER): Payer: Medicare Other | Admitting: Internal Medicine

## 2012-01-29 VITALS — BP 132/70 | Temp 97.9°F | Wt 170.0 lb

## 2012-01-29 DIAGNOSIS — R059 Cough, unspecified: Secondary | ICD-10-CM | POA: Diagnosis not present

## 2012-01-29 DIAGNOSIS — I10 Essential (primary) hypertension: Secondary | ICD-10-CM | POA: Diagnosis not present

## 2012-01-29 DIAGNOSIS — R05 Cough: Secondary | ICD-10-CM

## 2012-01-29 NOTE — Progress Notes (Signed)
  Subjective:    Patient ID: [name], female    DOB: [birth year], 78 y.o.   MRN: [id number]  HPI 57 -year-old patient  who has a history of treated hypertension. She presents with a chief complaint of cough that has been present since October of last year. She did have a chest x-ray performed in January that was negative. Cough is nonproductive. She does describe some postnasal drip and rhinorrhea but this seems mild and does not often correlate with her cough. She does have some mild to moderate reflux symptoms. No history of wheezing. She also complains of some right-sided chest wall pain aggravated by the coughing    Review of Systems  Constitutional: Negative.   HENT: Negative for hearing loss, congestion, sore throat, rhinorrhea, dental problem, sinus pressure and tinnitus.   Eyes: Negative for pain, discharge and visual disturbance.  Respiratory: Positive for cough. Negative for shortness of breath.   Cardiovascular: Positive for chest pain. Negative for palpitations and leg swelling.  Gastrointestinal: Negative for nausea, vomiting, abdominal pain, diarrhea, constipation, blood in stool and abdominal distention.  Genitourinary: Negative for dysuria, urgency, frequency, hematuria, flank pain, vaginal bleeding, vaginal discharge, difficulty urinating, vaginal pain and pelvic pain.  Musculoskeletal: Negative for joint swelling, arthralgias and gait problem.  Skin: Negative for rash.  Neurological: Negative for dizziness, syncope, speech difficulty, weakness, numbness and headaches.  Hematological: Negative for adenopathy.  Psychiatric/Behavioral: Negative for behavioral problems, dysphoric mood and agitation. The patient is not nervous/anxious.        Objective:   Physical Exam  Constitutional: She is oriented to person, place, and time. She appears well-developed and well-nourished. No distress.  HENT:  Head: Normocephalic.  Right Ear: External ear normal.  Left Ear: External ear  normal.  Mouth/Throat: Oropharynx is clear and moist.  Eyes: Conjunctivae and EOM are normal. Pupils are equal, round, and reactive to light.  Neck: Normal range of motion. Neck supple. No thyromegaly present.  Cardiovascular: Normal rate, regular rhythm, normal heart sounds and intact distal pulses.   Pulmonary/Chest: Effort normal and breath sounds normal. No respiratory distress. She has no wheezes. She has no rales.  Abdominal: Soft. Bowel sounds are normal. She exhibits no mass. There is no tenderness.  Musculoskeletal: Normal range of motion.  Lymphadenopathy:    She has no cervical adenopathy.  Neurological: She is alert and oriented to person, place, and time.  Skin: Skin is warm and dry. No rash noted.  Psychiatric: She has a normal mood and affect. Her behavior is normal.          Assessment & Plan:   Chronic cough. We'll give her a trial of an aggressive antireflux regimen. Samples of Dexilant provided. She will call in 2 weeks if unimproved and will consider pulmonary referral Hypertension stable

## 2012-01-29 NOTE — Telephone Encounter (Signed)
Pt called re: recurring cough that she has had for months. Pt has been experiencing pain in rt lung and a fullness and pt in rt side abd. Pt says that she has a hx of diverticulitis. Tried contacting [facility] but no one was avail. Pt was req ov to see Dr Cato Mulligan, but since pcp was not avail, pt was schd and ov to see Dr Amador Cunas today at 3:15pm, because pt said that she could not come in sooner due to prior appt elsewhere.

## 2012-01-29 NOTE — Telephone Encounter (Signed)
Spoke w/patient regarding symptoms: No chest pain, muscle ache, SOB, fever, nausea/vomiting, abdominal pain, edema. Pt has had unresolved cough w/prior imaging; pt is unsure if pain is in muscle or lung and will keep scheduled OV today at 3:15 w/Dr. Daneil Dan

## 2012-01-29 NOTE — Patient Instructions (Addendum)
Avoids foods high in acid such as tomatoes citrus juices, and spicy foods.  Avoid eating within two hours of lying down or before exercising.  Do not overheat.  Try smaller more frequent meals.  If symptoms persist, elevate the head of her bed 12 inches while sleeping.  Call in 2 or 3 weeks if your cough is not improved  DEXILANT  1 daily and

## 2012-03-21 ENCOUNTER — Telehealth: Payer: Self-pay | Admitting: Family Medicine

## 2012-03-21 NOTE — Telephone Encounter (Signed)
Pt calling on 03/21/12 states she was seen 2 months ago and was given a sample of medication to take for a cough/pt would like to have a prescription for that medication/she does not have the box or remember the name of the medication/ called to [facility] [address] [phone]

## 2012-03-22 NOTE — Telephone Encounter (Signed)
Swords pt, not [facility].

## 2012-03-22 NOTE — Telephone Encounter (Signed)
Would use a generic alternative: omeprzaole 20 mg po qd. #90/3 refills

## 2012-03-22 NOTE — Telephone Encounter (Signed)
Pt seen Dr Kirtland Bouchard in June and was given samples of dexilant.

## 2012-03-23 NOTE — Telephone Encounter (Signed)
Have her see a provider next week

## 2012-03-23 NOTE — Telephone Encounter (Signed)
Caller: [place]/Patient; Patient Name: [name]; PCP: Birdie Sons; [facility] Callback Phone Number: [phone].  Patient calling about ongoing cough since October 2012. She states she was seen by "Dr. Mervyn Skeeters"  and was given Rx that gave some temporary relief.  [facility] [address] - confirmed pharmacy information  in [place].  Reviewed EMR and confirmed patient had Rx in December2012  and January 2013, appointment with Dr. Lesia Hausen in June 2013.  Relates she has mild wheezing.  Denies fever.  See in 4 hours per  Cough- Adult protocol. No acute appointments available at [facility] this date.  Note to office for follow up per Cough protocol.  Advised caller to anticipate call back within an hour. She is already on Omeprazole. (reviewed note from Dr. Cato Mulligan on 03/22/12)

## 2012-03-23 NOTE — Telephone Encounter (Signed)
Caller: [place]/Patient; Patient Name: [name]; PCP: Birdie Sons; Best Callback Phone Number: [phone].  Patient calling about ongoing cough since October 2012. She states she was seen by "Dr. Mervyn Skeeters"  and was given Rx that gave some temporary relief.  [facility] [address]. Confirmed pharmacy information in Epic.   Reviewed EMR and confirmed patient had Rx in December2012  and January 2013, appointment with Dr. Lesia Hausen in June 2013.  Relates she has mild wheezing.  Denies fever.  See in 4 hours per  Cough- Adult protocol. No acute appointments available at [facility] this date.  Note to office for follow up per Cough protocol.  Advised caller to anticipate call back within an hour. She is already on Omeprazole. (reviewed note from Dr. Cato Mulligan on 03/22/12)

## 2012-03-24 NOTE — Telephone Encounter (Signed)
Lm for pt to call back and schedule appt  °

## 2012-03-31 ENCOUNTER — Encounter: Payer: Self-pay | Admitting: Internal Medicine

## 2012-03-31 ENCOUNTER — Ambulatory Visit (INDEPENDENT_AMBULATORY_CARE_PROVIDER_SITE_OTHER): Payer: Medicare Other | Admitting: Internal Medicine

## 2012-03-31 VITALS — BP 130/72 | HR 74 | Temp 98.3°F | Wt 173.0 lb

## 2012-03-31 DIAGNOSIS — R05 Cough: Secondary | ICD-10-CM

## 2012-03-31 DIAGNOSIS — I10 Essential (primary) hypertension: Secondary | ICD-10-CM

## 2012-03-31 DIAGNOSIS — R059 Cough, unspecified: Secondary | ICD-10-CM | POA: Diagnosis not present

## 2012-03-31 MED ORDER — FLUTICASONE PROPIONATE 50 MCG/ACT NA SUSP: 2.0000 | Freq: Every day | NASAL | Status: DC

## 2012-03-31 MED ORDER — HYDROCHLOROTHIAZIDE 12.5 MG PO TABS: 12.5000 mg | ORAL_TABLET | Freq: Every day | ORAL | Status: DC

## 2012-03-31 NOTE — Patient Instructions (Signed)
Substitute hydrochlorothiazide for Micardis-HCT  Fluticasone nasal spray (or Zetonna) daily  Use Claritin or Alavert once daily  Return in one month for followup

## 2012-03-31 NOTE — Progress Notes (Signed)
  Subjective:    Patient ID: [name], female    DOB: 1929/04/08, 78 y.o.   MRN: [id number]  HPI  78 year old patient who has treated hypertension who presents with a chief complaint of cough now of 10 months duration.  Anti-reflex regimen improved some mild reflux symptoms but not the cough. She has been on chronic ARB therapy. Associated symptoms include rhinorrhea  with postnasal drip and mild congestion.    Review of Systems  Constitutional: Negative.   HENT: Positive for congestion, rhinorrhea and postnasal drip. Negative for hearing loss, sore throat, dental problem, sinus pressure and tinnitus.   Eyes: Negative for pain, discharge and visual disturbance.  Respiratory: Positive for cough. Negative for shortness of breath.   Cardiovascular: Negative for chest pain, palpitations and leg swelling.  Gastrointestinal: Negative for nausea, vomiting, abdominal pain, diarrhea, constipation, blood in stool and abdominal distention.  Genitourinary: Negative for dysuria, urgency, frequency, hematuria, flank pain, vaginal bleeding, vaginal discharge, difficulty urinating, vaginal pain and pelvic pain.  Musculoskeletal: Negative for joint swelling, arthralgias and gait problem.  Skin: Negative for rash.  Neurological: Negative for dizziness, syncope, speech difficulty, weakness, numbness and headaches.  Hematological: Negative for adenopathy.  Psychiatric/Behavioral: Negative for behavioral problems, dysphoric mood and agitation. The patient is not nervous/anxious.        Objective:   Physical Exam  Constitutional: She is oriented to person, place, and time. She appears well-developed and well-nourished.  HENT:  Head: Normocephalic.  Right Ear: External ear normal.  Left Ear: External ear normal.  Mouth/Throat: Oropharynx is clear and moist.  Eyes: Conjunctivae and EOM are normal. Pupils are equal, round, and reactive to light.  Neck: Normal range of motion. Neck supple. No thyromegaly  present.  Cardiovascular: Normal rate, regular rhythm, normal heart sounds and intact distal pulses.   Pulmonary/Chest: Effort normal and breath sounds normal.  Abdominal: Soft. Bowel sounds are normal. She exhibits no mass. There is no tenderness.  Musculoskeletal: Normal range of motion.  Lymphadenopathy:    She has no cervical adenopathy.  Neurological: She is alert and oriented to person, place, and time.  Skin: Skin is warm and dry. No rash noted.  Psychiatric: She has a normal mood and affect. Her behavior is normal.          Assessment & Plan:   Chronic cough probably secondary to postnasal drip and upper airway irritation. Will place on nonsedating antihistamine fluticasone nasal spray. For 1 month Will hold micardis HCT and substitute HCTZ only Recheck 1 month

## 2012-04-27 ENCOUNTER — Other Ambulatory Visit: Payer: Self-pay | Admitting: Internal Medicine

## 2012-04-27 ENCOUNTER — Telehealth: Payer: Self-pay | Admitting: Internal Medicine

## 2012-04-27 NOTE — Telephone Encounter (Signed)
Pt stated amlodipine was charged to 2.5mg  by Dr Kirtland Bouchard. Pt stated she does not want to change from 5 mg. [facility] will fax form over for the changed. Per patient do not change

## 2012-04-28 ENCOUNTER — Ambulatory Visit: Payer: Medicare Other | Admitting: Internal Medicine

## 2012-05-02 NOTE — Telephone Encounter (Signed)
Ok to continue amlodipine 5 mg

## 2012-05-02 NOTE — Telephone Encounter (Signed)
Pt aware.

## 2012-05-09 DIAGNOSIS — R109 Unspecified abdominal pain: Secondary | ICD-10-CM | POA: Diagnosis not present

## 2012-05-09 DIAGNOSIS — K219 Gastro-esophageal reflux disease without esophagitis: Secondary | ICD-10-CM | POA: Diagnosis not present

## 2012-05-09 DIAGNOSIS — K449 Diaphragmatic hernia without obstruction or gangrene: Secondary | ICD-10-CM | POA: Diagnosis not present

## 2012-05-10 DIAGNOSIS — M169 Osteoarthritis of hip, unspecified: Secondary | ICD-10-CM | POA: Diagnosis not present

## 2012-05-18 DIAGNOSIS — Z23 Encounter for immunization: Secondary | ICD-10-CM | POA: Diagnosis not present

## 2012-05-20 ENCOUNTER — Encounter: Payer: Self-pay | Admitting: Internal Medicine

## 2012-05-20 ENCOUNTER — Ambulatory Visit (INDEPENDENT_AMBULATORY_CARE_PROVIDER_SITE_OTHER): Payer: Medicare Other | Admitting: Internal Medicine

## 2012-05-20 ENCOUNTER — Ambulatory Visit: Payer: Medicare Other | Admitting: Internal Medicine

## 2012-05-20 VITALS — BP 162/72 | HR 68 | Temp 98.5°F | Wt 173.0 lb

## 2012-05-20 DIAGNOSIS — I10 Essential (primary) hypertension: Secondary | ICD-10-CM | POA: Diagnosis not present

## 2012-05-20 DIAGNOSIS — D649 Anemia, unspecified: Secondary | ICD-10-CM | POA: Diagnosis not present

## 2012-05-20 DIAGNOSIS — E039 Hypothyroidism, unspecified: Secondary | ICD-10-CM

## 2012-05-20 DIAGNOSIS — D638 Anemia in other chronic diseases classified elsewhere: Secondary | ICD-10-CM

## 2012-05-20 LAB — CBC WITH DIFFERENTIAL/PLATELET
Basophils Relative: 1.3 % (ref 0.0–3.0)
Eosinophils Relative: 2.8 % (ref 0.0–5.0)
Lymphocytes Relative: 26.2 % (ref 12.0–46.0)
Monocytes Relative: 8.9 % (ref 3.0–12.0)
Neutrophils Relative %: 60.8 % (ref 43.0–77.0)
RBC: 3.51 Mil/uL — ABNORMAL LOW (ref 3.87–5.11)
WBC: 6.2 10*3/uL (ref 4.5–10.5)

## 2012-05-20 LAB — BASIC METABOLIC PANEL
BUN: 17 mg/dL (ref 6–23)
Chloride: 100 mEq/L (ref 96–112)
Glucose, Bld: 83 mg/dL (ref 70–99)
Potassium: 4.4 mEq/L (ref 3.5–5.1)
Sodium: 134 mEq/L — ABNORMAL LOW (ref 135–145)

## 2012-05-20 LAB — TSH: TSH: 5.13 u[IU]/mL (ref 0.35–5.50)

## 2012-05-20 MED ORDER — HYDROCHLOROTHIAZIDE 25 MG PO TABS: 25.0000 mg | ORAL_TABLET | Freq: Every day | ORAL | Status: DC

## 2012-05-20 MED ORDER — AMLODIPINE BESYLATE 5 MG PO TABS: 5.0000 mg | ORAL_TABLET | Freq: Every day | ORAL | Status: DC

## 2012-05-20 MED ORDER — NORTRIPTYLINE HCL 10 MG PO CAPS: 10.0000 mg | ORAL_CAPSULE | Freq: Every evening | ORAL | Status: DC | PRN

## 2012-05-20 NOTE — Progress Notes (Signed)
Patient ID: [name], female   DOB: 20-Jul-1929, 78 y.o.   MRN: [id number] Cough- continues. She was not compliant with Dr. Charm Rings recommendations-- reviewed note She continues on micardis She has also seen dr. Ewing Schlein-- doubled PPI- no help Reviewed note- consider modified barium swallow  HTN- tolerating meds- see above. She does not take home BPs  Thyroid-  Lab Results  Component Value Date   TSH 1.68 03/31/2011   Past Medical History  Diagnosis Date  . HYPOTHYROIDISM 04/04/2007  . HYPERTENSION 04/04/2007  . Chronic fatigue   . Depression   . Anemia     History   Social History  . Marital Status: Widowed    Spouse Name: N/A    Number of Children: N/A  . Years of Education: N/A   Occupational History  . Not on file.   Social History Main Topics  . Smoking status: Former Smoker -- 1.0 packs/day for 7 years    Quit date: 10/13/1969  . Smokeless tobacco: Not on file  . Alcohol Use: Not on file  . Drug Use: Not on file  . Sexually Active: Not on file   Other Topics Concern  . Not on file   Social History Narrative  . No narrative on file    Past Surgical History  Procedure Date  . Appendectomy   . Cholecystectomy   . Hernia repair   . Abdominal hysterectomy   . Spine surgery 2011    cervical  . Oophorectomy   . Back surgery 2012  . Orif tibia fracture 2012    dr duda    Family History  Problem Relation Age of Onset  . Heart disease Mother   . Cancer Mother 62    renal cell CA    Allergies  Allergen Reactions  . Oxycodone Hcl     REACTION: itching    Current Outpatient Prescriptions on File Prior to Visit  Medication Sig Dispense Refill  . calcium carbonate (OS-CAL) 1250 MG chewable tablet Chew 1 tablet by mouth daily.        . clidinium-chlordiazePOXIDE (LIBRAX) 2.5-5 MG per capsule 2 (two) times daily as needed.       . fluticasone (FLONASE) 50 MCG/ACT nasal spray Place 2 sprays into the nose daily.  16 g  6  . hydrochlorothiazide (HYDRODIURIL)  12.5 MG tablet Take 1 tablet (12.5 mg total) by mouth daily.  90 tablet  3  . levothyroxine (SYNTHROID, LEVOTHROID) 125 MCG tablet Take 1 tablet (125 mcg total) by mouth daily.  90 tablet  3  . omeprazole (PRILOSEC) 40 MG capsule Take 2 tablets by mouth daily.      Marland Kitchen telmisartan-hydrochlorothiazide (MICARDIS HCT) 80-12.5 MG per tablet Take 1 tablet by mouth daily.  90 tablet  3  . DISCONTD: amLODipine (NORVASC) 5 MG tablet TAKE 1 TABLET DAILY  90 tablet  2  . DISCONTD: amLODipine (NORVASC) 5 MG tablet Take 2.5 mg by mouth daily.         patient denies chest pain, shortness of breath, orthopnea. Denies lower extremity edema, abdominal pain, change in appetite, change in bowel movements. Patient denies rashes, musculoskeletal complaints. No other specific complaints in a complete review of systems.   BP 162/72  Pulse 68  Temp 98.5 F (36.9 C) (Oral)  Wt 173 lb (78.472 kg)  Well-developed well-nourished female in no acute distress. HEENT exam atraumatic, normocephalic, extraocular muscles are intact. Neck is supple. No jugular venous distention no thyromegaly. Chest clear to auscultation without  increased work of breathing. Cardiac exam S1 and S2 are regular. Abdominal exam active bowel sounds, soft, nontender.

## 2012-05-20 NOTE — Assessment & Plan Note (Signed)
Check tsh today 

## 2012-05-22 NOTE — Assessment & Plan Note (Signed)
Lab Results  Component Value Date   WBC 6.2 05/20/2012   HGB 10.9* 05/20/2012   HCT 33.1* 05/20/2012   MCV 94.3 05/20/2012   PLT 323.0 05/20/2012   Check labs today May need iron studies

## 2012-05-22 NOTE — Assessment & Plan Note (Signed)
BP Readings from Last 3 Encounters:  05/20/12 162/72  03/31/12 130/72  01/29/12 132/70   i've asked her to monitor bp at home Continue same meds for now

## 2012-05-24 ENCOUNTER — Other Ambulatory Visit (INDEPENDENT_AMBULATORY_CARE_PROVIDER_SITE_OTHER): Payer: Medicare Other

## 2012-05-24 DIAGNOSIS — D518 Other vitamin B12 deficiency anemias: Secondary | ICD-10-CM | POA: Diagnosis not present

## 2012-05-24 DIAGNOSIS — D638 Anemia in other chronic diseases classified elsewhere: Secondary | ICD-10-CM

## 2012-05-24 DIAGNOSIS — D519 Vitamin B12 deficiency anemia, unspecified: Secondary | ICD-10-CM

## 2012-05-24 LAB — IBC PANEL
Iron: 60 ug/dL (ref 42–145)
Saturation Ratios: 20.8 % (ref 20.0–50.0)
Transferrin: 206.1 mg/dL — ABNORMAL LOW (ref 212.0–360.0)

## 2012-06-17 ENCOUNTER — Ambulatory Visit: Payer: Medicare Other | Admitting: *Deleted

## 2012-06-17 ENCOUNTER — Encounter: Payer: Self-pay | Admitting: *Deleted

## 2012-06-17 ENCOUNTER — Encounter: Payer: Medicare Other | Admitting: Internal Medicine

## 2012-06-17 VITALS — BP 168/70 | HR 64

## 2012-06-17 DIAGNOSIS — I10 Essential (primary) hypertension: Secondary | ICD-10-CM

## 2012-07-18 DIAGNOSIS — M48061 Spinal stenosis, lumbar region without neurogenic claudication: Secondary | ICD-10-CM | POA: Diagnosis not present

## 2012-07-22 DIAGNOSIS — R109 Unspecified abdominal pain: Secondary | ICD-10-CM | POA: Diagnosis not present

## 2012-07-22 DIAGNOSIS — K219 Gastro-esophageal reflux disease without esophagitis: Secondary | ICD-10-CM | POA: Diagnosis not present

## 2012-08-09 ENCOUNTER — Encounter: Payer: Self-pay | Admitting: Internal Medicine

## 2012-08-09 ENCOUNTER — Telehealth: Payer: Self-pay | Admitting: Internal Medicine

## 2012-08-09 ENCOUNTER — Ambulatory Visit (INDEPENDENT_AMBULATORY_CARE_PROVIDER_SITE_OTHER): Payer: Medicare Other | Admitting: Internal Medicine

## 2012-08-09 VITALS — BP 190/72 | HR 63 | Temp 98.0°F | Wt 180.0 lb

## 2012-08-09 DIAGNOSIS — I10 Essential (primary) hypertension: Secondary | ICD-10-CM | POA: Diagnosis not present

## 2012-08-09 DIAGNOSIS — G4700 Insomnia, unspecified: Secondary | ICD-10-CM

## 2012-08-09 DIAGNOSIS — F5104 Psychophysiologic insomnia: Secondary | ICD-10-CM

## 2012-08-09 MED ORDER — LOSARTAN POTASSIUM 100 MG PO TABS: 100.0000 mg | ORAL_TABLET | Freq: Every day | ORAL | Status: DC

## 2012-08-09 MED ORDER — ZOLPIDEM TARTRATE 5 MG PO TABS: 5.0000 mg | ORAL_TABLET | Freq: Every evening | ORAL | Status: DC | PRN

## 2012-08-09 NOTE — Assessment & Plan Note (Signed)
Patient has chronic insomnia that is unresponsive to Pamelor. She has been on Ambien CR in the past. We discussed potential risks of using sedatives. Trial of zolpidem 5 mg at bedtime as needed.

## 2012-08-09 NOTE — Telephone Encounter (Signed)
Spoke with patient and an appointment made 

## 2012-08-09 NOTE — Telephone Encounter (Signed)
Patient needs to be checked in the office today for increased edema in legs for past few weeks. Needs BP checked. Recently switched from Mycartis to HCTZ ( on 05/20/12) and does not think new med is working as well. No shortness of breath. Swelling extends above knee. Please call her to let her know if she can be worked into schedule for today-08/09/12.

## 2012-08-09 NOTE — Assessment & Plan Note (Signed)
Blood pressure control deteriorated since changing her medication regimen. Micardis was discontinued due to cost reasons. She is having increased lower extremity edema secondary to side effect from amlodipine. Add losartan 100 mg once daily. Take one half dose of hydrochlorothiazide. Monitor electrolytes and kidney function. Reassess in 2 weeks.

## 2012-08-09 NOTE — Progress Notes (Signed)
Subjective:    Patient ID: [name], female    DOB: 1928/08/28, 78 y.o.   MRN: [id number]  HPI  78 year old white female with history of hypertension and hypothyroidism for followup. Patient complains of worsening lower extremity edema for the last 1 to 2 months. In October 2013, her Micardis was discontinued. This is mainly due to a change in her insurance plan.. Micardis cost prohibitive. She was switched to hydrochlorothiazide 25 mg once daily. Amlodipine was increased from 2.5-5 mg.  She has noticed a mild weight gain but denies any shortness of breath orthopnea. She does not have any history of heart disease.  Patient also complains of chronic insomnia. She was previously Ambien CR for years.  She denies any previous side effects with Ambien CR. Her primary care physician-Dr. Swords switch her to [employer]. Patient reports medication is not working.  She denies hx of falls or gait instability. She denies hx of dementia.  Review of Systems Negative for chest pain or shortness of breath  Past Medical History  Diagnosis Date  . HYPOTHYROIDISM 04/04/2007  . HYPERTENSION 04/04/2007  . Chronic fatigue   . Depression   . Anemia     History   Social History  . Marital Status: Widowed    Spouse Name: N/A    Number of Children: N/A  . Years of Education: N/A   Occupational History  . Not on file.   Social History Main Topics  . Smoking status: Former Smoker -- 1.0 packs/day for 7 years    Quit date: 10/13/1969  . Smokeless tobacco: Not on file  . Alcohol Use: Not on file  . Drug Use: Not on file  . Sexually Active: Not on file   Other Topics Concern  . Not on file   Social History Narrative  . No narrative on file    Past Surgical History  Procedure Date  . Appendectomy   . Cholecystectomy   . Hernia repair   . Abdominal hysterectomy   . Spine surgery 2011    cervical  . Oophorectomy   . Back surgery 2012  . Orif tibia fracture 2012    dr duda    Family  History  Problem Relation Age of Onset  . Heart disease Mother   . Cancer Mother 20    renal cell CA  . Lymphoma Son     Allergies  Allergen Reactions  . Oxycodone Hcl     REACTION: itching    Current Outpatient Prescriptions on File Prior to Visit  Medication Sig Dispense Refill  . amLODipine (NORVASC) 5 MG tablet Take 1 tablet (5 mg total) by mouth daily.      . calcium carbonate (OS-CAL) 1250 MG chewable tablet Chew 1 tablet by mouth daily.        . clidinium-chlordiazePOXIDE (LIBRAX) 2.5-5 MG per capsule 2 (two) times daily as needed.       . fluticasone (FLONASE) 50 MCG/ACT nasal spray Place 2 sprays into the nose daily.  16 g  6  . hydrochlorothiazide (HYDRODIURIL) 25 MG tablet Take 0.5 tablets (12.5 mg total) by mouth daily.  90 tablet  3  . levothyroxine (SYNTHROID, LEVOTHROID) 125 MCG tablet Take 1 tablet (125 mcg total) by mouth daily.  90 tablet  3  . omeprazole (PRILOSEC) 40 MG capsule Take 2 tablets by mouth daily.      Marland Kitchen losartan (COZAAR) 100 MG tablet Take 1 tablet (100 mg total) by mouth daily.  90 tablet  1  .  zolpidem (AMBIEN) 5 MG tablet Take 1 tablet (5 mg total) by mouth at bedtime as needed for sleep.  30 tablet  1    BP 190/72  Pulse 63  Temp 98 F (36.7 C) (Oral)  Wt 180 lb (81.647 kg)  SpO2 97%       Objective:   Physical Exam  Constitutional: She appears well-developed and well-nourished.  Cardiovascular: Normal rate, regular rhythm and normal heart sounds.   No murmur heard. Pulmonary/Chest: Effort normal and breath sounds normal. She has no wheezes. She has no rales.  Musculoskeletal: She exhibits edema.       Bilateral lower extremity edema +2 (worse on the left leg and right)  Psychiatric: She has a normal mood and affect. Her behavior is normal.          Assessment & Plan:

## 2012-08-23 ENCOUNTER — Encounter: Payer: Self-pay | Admitting: Internal Medicine

## 2012-08-23 ENCOUNTER — Ambulatory Visit (INDEPENDENT_AMBULATORY_CARE_PROVIDER_SITE_OTHER): Payer: Medicare Other | Admitting: Internal Medicine

## 2012-08-23 VITALS — BP 168/66 | HR 68 | Temp 98.5°F | Wt 176.0 lb

## 2012-08-23 DIAGNOSIS — M7989 Other specified soft tissue disorders: Secondary | ICD-10-CM | POA: Diagnosis not present

## 2012-08-23 DIAGNOSIS — R609 Edema, unspecified: Secondary | ICD-10-CM | POA: Diagnosis not present

## 2012-08-23 DIAGNOSIS — I10 Essential (primary) hypertension: Secondary | ICD-10-CM

## 2012-08-23 DIAGNOSIS — R600 Localized edema: Secondary | ICD-10-CM | POA: Insufficient documentation

## 2012-08-23 MED ORDER — OLMESARTAN MEDOXOMIL-HCTZ 40-12.5 MG PO TABS: 1.0000 | ORAL_TABLET | Freq: Every day | ORAL | Status: DC

## 2012-08-23 NOTE — Assessment & Plan Note (Signed)
Blood pressure still suboptimally controlled. Discontinue losartan and hydrochlorothiazide. Switch to Benicar HCTZ 40/12.5 mg.  Continue amlodipine 5 mg.  Reassess in 4 weeks BP: 168/66 mmHg

## 2012-08-23 NOTE — Patient Instructions (Addendum)
Please complete the following lab tests before your next follow up appointment: BMET - 401.9 TSH - 244.9 

## 2012-08-23 NOTE — Progress Notes (Signed)
Subjective:    Patient ID: [name], female    DOB: 1929-06-27, 79 y.o.   MRN: [id number]  HPI  79 year old white female for followup regarding hypertension and lower extremity edema.  Patient restarted on ARB. She is currently taking losartan 100 mg once daily. Blood pressure has slightly improved but is still suboptimal.  Patient also complains of persistent lower extremity edema. It has slightly improved from previous visit. Edema seems to be worse in her left leg.  Review of Systems Negative for fever or chills  Past Medical History  Diagnosis Date  . HYPOTHYROIDISM 04/04/2007  . HYPERTENSION 04/04/2007  . Chronic fatigue   . Depression   . Anemia     History   Social History  . Marital Status: Widowed    Spouse Name: N/A    Number of Children: N/A  . Years of Education: N/A   Occupational History  . Not on file.   Social History Main Topics  . Smoking status: Former Smoker -- 1.0 packs/day for 7 years    Quit date: 10/13/1969  . Smokeless tobacco: Not on file  . Alcohol Use: Not on file  . Drug Use: Not on file  . Sexually Active: Not on file   Other Topics Concern  . Not on file   Social History Narrative  . No narrative on file    Past Surgical History  Procedure Date  . Appendectomy   . Cholecystectomy   . Hernia repair   . Abdominal hysterectomy   . Spine surgery 2011    cervical  . Oophorectomy   . Back surgery 2012  . Orif tibia fracture 2012    dr duda    Family History  Problem Relation Age of Onset  . Heart disease Mother   . Cancer Mother 58    renal cell CA  . Lymphoma Son     Allergies  Allergen Reactions  . Oxycodone Hcl     REACTION: itching    Current Outpatient Prescriptions on File Prior to Visit  Medication Sig Dispense Refill  . amLODipine (NORVASC) 5 MG tablet Take 1 tablet (5 mg total) by mouth daily.      . calcium carbonate (OS-CAL) 1250 MG chewable tablet Chew 1 tablet by mouth daily.        .  clidinium-chlordiazePOXIDE (LIBRAX) 2.5-5 MG per capsule 2 (two) times daily as needed.       . fluticasone (FLONASE) 50 MCG/ACT nasal spray Place 2 sprays into the nose daily.  16 g  6  . levothyroxine (SYNTHROID, LEVOTHROID) 125 MCG tablet Take 1 tablet (125 mcg total) by mouth daily.  90 tablet  3  . omeprazole (PRILOSEC) 40 MG capsule Take 2 tablets by mouth daily.      Marland Kitchen zolpidem (AMBIEN) 5 MG tablet Take 1 tablet (5 mg total) by mouth at bedtime as needed for sleep.  30 tablet  1  . olmesartan-hydrochlorothiazide (BENICAR HCT) 40-12.5 MG per tablet Take 1 tablet by mouth daily.  90 tablet  1    BP 168/66  Pulse 68  Temp 98.5 F (36.9 C) (Oral)  Wt 176 lb (79.833 kg)       Objective:   Physical Exam  Constitutional: She appears well-developed and well-nourished.  HENT:  Head: Normocephalic and atraumatic.  Cardiovascular: Normal rate, regular rhythm and normal heart sounds.   Pulmonary/Chest: Effort normal. No respiratory distress. She has no wheezes. She has rales.  Musculoskeletal: She exhibits edema.  Skin:  Lower extremity edema worse on left leg, minimal redness anterior shin on left side  Psychiatric: She has a normal mood and affect. Her behavior is normal.          Assessment & Plan:

## 2012-08-23 NOTE — Assessment & Plan Note (Signed)
I suspect patient's lower extremity edema is secondary to side effect of amlodipine. However patient has had minimal improvement with addition of ARB and she has greater edema in left leg versus right. Obtain venous Doppler to rule out DVT.

## 2012-08-29 ENCOUNTER — Encounter (INDEPENDENT_AMBULATORY_CARE_PROVIDER_SITE_OTHER): Payer: Medicare Other

## 2012-08-29 DIAGNOSIS — M7989 Other specified soft tissue disorders: Secondary | ICD-10-CM | POA: Diagnosis not present

## 2012-08-29 DIAGNOSIS — L539 Erythematous condition, unspecified: Secondary | ICD-10-CM

## 2012-09-07 ENCOUNTER — Other Ambulatory Visit: Payer: Self-pay | Admitting: Internal Medicine

## 2012-09-13 ENCOUNTER — Other Ambulatory Visit (INDEPENDENT_AMBULATORY_CARE_PROVIDER_SITE_OTHER): Payer: Medicare Other

## 2012-09-13 DIAGNOSIS — E039 Hypothyroidism, unspecified: Secondary | ICD-10-CM

## 2012-09-13 DIAGNOSIS — I10 Essential (primary) hypertension: Secondary | ICD-10-CM

## 2012-09-13 LAB — TSH: TSH: 0.42 u[IU]/mL (ref 0.35–5.50)

## 2012-09-13 LAB — BASIC METABOLIC PANEL
Calcium: 9.2 mg/dL (ref 8.4–10.5)
GFR: 45.79 mL/min — ABNORMAL LOW (ref >60.00)
Glucose, Bld: 93 mg/dL (ref 70–99)
Potassium: 3.8 mEq/L (ref 3.5–5.1)
Sodium: 137 mEq/L (ref 135–145)

## 2012-09-20 ENCOUNTER — Encounter: Payer: Self-pay | Admitting: Internal Medicine

## 2012-09-20 ENCOUNTER — Ambulatory Visit (INDEPENDENT_AMBULATORY_CARE_PROVIDER_SITE_OTHER): Payer: Medicare Other | Admitting: Internal Medicine

## 2012-09-20 VITALS — BP 144/60 | HR 68 | Temp 98.3°F | Wt 179.0 lb

## 2012-09-20 DIAGNOSIS — I10 Essential (primary) hypertension: Secondary | ICD-10-CM | POA: Diagnosis not present

## 2012-09-20 DIAGNOSIS — E039 Hypothyroidism, unspecified: Secondary | ICD-10-CM

## 2012-09-20 MED ORDER — POTASSIUM CHLORIDE ER 10 MEQ PO TBCR: 10.0000 meq | EXTENDED_RELEASE_TABLET | Freq: Two times a day (BID) | ORAL | Status: DC

## 2012-09-20 NOTE — Patient Instructions (Addendum)
You can take over the counter Magnesium oxide supplement daily to see if it helps you with cramps Please complete the following lab tests before your next follow up appointment: BMET - 401.9

## 2012-09-20 NOTE — Assessment & Plan Note (Signed)
Blood pressure improved. Continue Benicar HCT 40/2.5 mg once daily and amlodipine 5 mg once daily. She still has some lower extremity edema. She declines use of compression stockings. She is having intermittent muscle cramps. I suggest trial of potassium supplementation 10 mEq once daily and over-the-counter magnesium supplement daily. If persistent symptoms, we discussed possibly using low dose gabapentin. BP: 144/60 mmHg

## 2012-09-20 NOTE — Progress Notes (Signed)
Subjective:    Patient ID: [name], female    DOB: 02/07/1929, 79 y.o.   MRN: [id number]  HPI  79 year old white female for followup regarding hypertension. Patient's losartan was discontinued. She was switched to Benicar HCTZ. Patient reports lower remedy edema has improved but not completely resolved. She has intermittent cramps. It can be in her extremities but also in her torso. Her recent electrolyte panel shows potassium of 3.8, sodium of 137, and serum Cr of 1.2  Patient concerned about her overall weight. She attributes her weight to slow metabolism. Patient stays fairly active.  She maintains her own home and is active in her church.  She reports period of inactivity for 3 weeks after her son died of leukemia.  She denies chronic depressive symptoms.  Review of Systems Negative for chest pain or shortness of breath.  Past Medical History  Diagnosis Date  . HYPOTHYROIDISM 04/04/2007  . HYPERTENSION 04/04/2007  . Chronic fatigue   . Depression   . Anemia     History   Social History  . Marital Status: Widowed    Spouse Name: N/A    Number of Children: N/A  . Years of Education: N/A   Occupational History  . Not on file.   Social History Main Topics  . Smoking status: Former Smoker -- 1.00 packs/day for 7 years    Quit date: 10/13/1969  . Smokeless tobacco: Not on file  . Alcohol Use: Not on file  . Drug Use: Not on file  . Sexually Active: Not on file   Other Topics Concern  . Not on file   Social History Narrative  . No narrative on file    Past Surgical History  Procedure Laterality Date  . Appendectomy    . Cholecystectomy    . Hernia repair    . Abdominal hysterectomy    . Spine surgery  2011    cervical  . Oophorectomy    . Back surgery  2012  . Orif tibia fracture  2012    dr duda    Family History  Problem Relation Age of Onset  . Heart disease Mother   . Cancer Mother 78    renal cell CA  . Lymphoma Son     Allergies  Allergen  Reactions  . Oxycodone Hcl     REACTION: itching    Current Outpatient Prescriptions on File Prior to Visit  Medication Sig Dispense Refill  . amLODipine (NORVASC) 5 MG tablet Take 1 tablet (5 mg total) by mouth daily.      . calcium carbonate (OS-CAL) 1250 MG chewable tablet Chew 1 tablet by mouth daily.        . clidinium-chlordiazePOXIDE (LIBRAX) 2.5-5 MG per capsule 2 (two) times daily as needed.       . fluticasone (FLONASE) 50 MCG/ACT nasal spray Place 2 sprays into the nose daily.  16 g  6  . levothyroxine (SYNTHROID, LEVOTHROID) 125 MCG tablet TAKE 1 TABLET DAILY  90 tablet  2  . olmesartan-hydrochlorothiazide (BENICAR HCT) 40-12.5 MG per tablet Take 1 tablet by mouth daily.  90 tablet  1  . omeprazole (PRILOSEC) 40 MG capsule Take 2 tablets by mouth daily.      Marland Kitchen zolpidem (AMBIEN) 5 MG tablet Take 1 tablet (5 mg total) by mouth at bedtime as needed for sleep.  30 tablet  1   No current facility-administered medications on file prior to visit.    Pulse 68  Temp(Src) 98.3 F (  36.8 C) (Oral)  Wt 179 lb (81.194 kg)  BMI 32.73 kg/m2       Objective:   Physical Exam  Constitutional: She is oriented to person, place, and time. She appears well-developed and well-nourished.  Cardiovascular: Normal rate, regular rhythm and normal heart sounds.   Pulmonary/Chest: Effort normal and breath sounds normal. She has no wheezes.  Musculoskeletal:  +1 edema bilaterally  Neurological: She is alert and oriented to person, place, and time. No cranial nerve deficit.  Psychiatric: She has a normal mood and affect.          Assessment & Plan:

## 2012-09-20 NOTE — Assessment & Plan Note (Signed)
She is euthyroid.  Continue same dose of Levothyroxine.  Lab Results  Component Value Date   TSH 0.42 09/13/2012

## 2012-09-21 ENCOUNTER — Telehealth: Payer: Self-pay | Admitting: Internal Medicine

## 2012-09-21 NOTE — Telephone Encounter (Signed)
Patient calling about prescription following OV yesterday 2/11.  Had checked with [facility] and they did not get prescription.  Checked EPIC - Rx sent to [facility]. Patient verbalizes understanding.

## 2012-10-19 DIAGNOSIS — K219 Gastro-esophageal reflux disease without esophagitis: Secondary | ICD-10-CM | POA: Diagnosis not present

## 2012-10-19 DIAGNOSIS — R109 Unspecified abdominal pain: Secondary | ICD-10-CM | POA: Diagnosis not present

## 2012-10-19 DIAGNOSIS — K529 Noninfective gastroenteritis and colitis, unspecified: Secondary | ICD-10-CM | POA: Diagnosis not present

## 2012-11-11 ENCOUNTER — Encounter: Payer: Self-pay | Admitting: Internal Medicine

## 2012-11-11 ENCOUNTER — Ambulatory Visit (INDEPENDENT_AMBULATORY_CARE_PROVIDER_SITE_OTHER): Payer: Medicare Other | Admitting: Internal Medicine

## 2012-11-11 VITALS — BP 160/70 | HR 64 | Temp 98.8°F | Wt 180.0 lb

## 2012-11-11 DIAGNOSIS — I10 Essential (primary) hypertension: Secondary | ICD-10-CM | POA: Diagnosis not present

## 2012-11-11 DIAGNOSIS — R5383 Other fatigue: Secondary | ICD-10-CM | POA: Diagnosis not present

## 2012-11-11 DIAGNOSIS — D638 Anemia in other chronic diseases classified elsewhere: Secondary | ICD-10-CM | POA: Diagnosis not present

## 2012-11-11 DIAGNOSIS — E039 Hypothyroidism, unspecified: Secondary | ICD-10-CM | POA: Diagnosis not present

## 2012-11-11 DIAGNOSIS — R5382 Chronic fatigue, unspecified: Secondary | ICD-10-CM | POA: Insufficient documentation

## 2012-11-11 DIAGNOSIS — R5381 Other malaise: Secondary | ICD-10-CM | POA: Diagnosis not present

## 2012-11-11 LAB — CBC WITH DIFFERENTIAL/PLATELET
Basophils Absolute: 0.1 10*3/uL (ref 0.0–0.1)
Eosinophils Absolute: 0.3 10*3/uL (ref 0.0–0.7)
Hemoglobin: 10.7 g/dL — ABNORMAL LOW (ref 12.0–15.0)
Lymphocytes Relative: 29.5 % (ref 12.0–46.0)
MCHC: 33.2 g/dL (ref 30.0–36.0)
MCV: 89.1 fl (ref 78.0–100.0)
Monocytes Absolute: 0.7 10*3/uL (ref 0.1–1.0)
Neutro Abs: 4.2 10*3/uL (ref 1.4–7.7)
RDW: 14.2 % (ref 11.5–14.6)

## 2012-11-11 LAB — BASIC METABOLIC PANEL
CO2: 26 mEq/L (ref 19–32)
Calcium: 9.2 mg/dL (ref 8.4–10.5)
Creatinine, Ser: 1.2 mg/dL (ref 0.4–1.2)
Glucose, Bld: 96 mg/dL (ref 70–99)
Sodium: 134 mEq/L — ABNORMAL LOW (ref 135–145)

## 2012-11-11 LAB — IBC PANEL
Iron: 52 ug/dL (ref 42–145)
Saturation Ratios: 16.4 % — ABNORMAL LOW (ref 20.0–50.0)
Transferrin: 227.1 mg/dL (ref 212.0–360.0)

## 2012-11-11 LAB — HEPATIC FUNCTION PANEL
Albumin: 4.1 g/dL (ref 3.5–5.2)
Bilirubin, Direct: 0.1 mg/dL (ref 0.0–0.3)
Total Protein: 7.1 g/dL (ref 6.0–8.3)

## 2012-11-11 NOTE — Patient Instructions (Addendum)
Our office with contact you re: blood test results

## 2012-11-11 NOTE — Assessment & Plan Note (Signed)
Stable.  No change in medication regimen. 

## 2012-11-11 NOTE — Progress Notes (Signed)
Subjective:    Patient ID: [name], female    DOB: 11/20/1928, 79 y.o.   MRN: [id number]  HPI  79 year old white female with history of hypertension and hypothyroidism for followup. Patient reports progressive fatigue over last several weeks. She has "no energy". She is having difficulty performing activities of daily living around her house. She has history of normocytic anemia in the past. Patient reports her previous primary care physician thought she might have slow GI bleed. She denies any shortness of breath. She has mild cough.  Patient reports her fatigue has gotten worse since changing her blood pressure medication. She denies any symptoms of low blood pressure or orthostasis.  Review of Systems  Negative for chest pain or shortness of breath    Past Medical History  Diagnosis Date  . HYPOTHYROIDISM 04/04/2007  . HYPERTENSION 04/04/2007  . Chronic fatigue   . Depression   . Anemia     History   Social History  . Marital Status: Widowed    Spouse Name: N/A    Number of Children: N/A  . Years of Education: N/A   Occupational History  . Not on file.   Social History Main Topics  . Smoking status: Former Smoker -- 1.00 packs/day for 7 years    Quit date: 10/13/1969  . Smokeless tobacco: Not on file  . Alcohol Use: Not on file  . Drug Use: Not on file  . Sexually Active: Not on file   Other Topics Concern  . Not on file   Social History Narrative  . No narrative on file    Past Surgical History  Procedure Laterality Date  . Appendectomy    . Cholecystectomy    . Hernia repair    . Abdominal hysterectomy    . Spine surgery  2011    cervical  . Oophorectomy    . Back surgery  2012  . Orif tibia fracture  2012    dr duda    Family History  Problem Relation Age of Onset  . Heart disease Mother   . Cancer Mother 44    renal cell CA  . Lymphoma Son     Allergies  Allergen Reactions  . Oxycodone Hcl     REACTION: itching    Current  Outpatient Prescriptions on File Prior to Visit  Medication Sig Dispense Refill  . amLODipine (NORVASC) 5 MG tablet Take 1 tablet (5 mg total) by mouth daily.      . clidinium-chlordiazePOXIDE (LIBRAX) 2.5-5 MG per capsule 2 (two) times daily as needed.       . fluticasone (FLONASE) 50 MCG/ACT nasal spray Place 2 sprays into the nose daily.  16 g  6  . levothyroxine (SYNTHROID, LEVOTHROID) 125 MCG tablet TAKE 1 TABLET DAILY  90 tablet  2  . olmesartan-hydrochlorothiazide (BENICAR HCT) 40-12.5 MG per tablet Take 1 tablet by mouth daily.  90 tablet  1  . omeprazole (PRILOSEC) 40 MG capsule Take 2 tablets by mouth daily.      Marland Kitchen zolpidem (AMBIEN) 5 MG tablet Take 1 tablet (5 mg total) by mouth at bedtime as needed for sleep.  30 tablet  1   No current facility-administered medications on file prior to visit.    BP 160/70  Pulse 64  Temp(Src) 98.8 F (37.1 C) (Oral)  Wt 180 lb (81.647 kg)  BMI 32.91 kg/m2    Objective:   Physical Exam  Constitutional: She is oriented to person, place, and time. She appears  well-developed and well-nourished.  HENT:  Head: Normocephalic and atraumatic.  Right Ear: External ear normal.  Left Ear: External ear normal.  Mouth/Throat: Oropharynx is clear and moist.  Eyes: Conjunctivae and EOM are normal. Pupils are equal, round, and reactive to light.  Cardiovascular: Normal rate, regular rhythm and normal heart sounds.   Pulmonary/Chest: Effort normal and breath sounds normal. She has no wheezes.  Abdominal: Soft. Bowel sounds are normal. She exhibits no mass. There is no tenderness.  Neurological: She is alert and oriented to person, place, and time. No cranial nerve deficit.  Psychiatric: She has a normal mood and affect. Her behavior is normal.          Assessment & Plan:

## 2012-11-11 NOTE — Assessment & Plan Note (Addendum)
Patient reports worsening fatigue over last several weeks. I doubt her symptoms are related to her blood pressure medication. Blood pressure with manual cuff is 160/70 in both arms. No drop in blood pressure with standing. She has history of normocytic anemia. Repeat CBC. Her previous B12 level was normal. Also repeat thyroid function study tests.  Patient advised to follow up with her PCP within 1 month.

## 2012-11-14 MED ORDER — LEVOTHYROXINE SODIUM 150 MCG PO TABS: 150.0000 ug | ORAL_TABLET | Freq: Every day | ORAL | Status: DC

## 2012-11-14 NOTE — Addendum Note (Signed)
Addended by: Meda Coffee on: 11/14/2012 01:14 PM   Modules accepted: Orders

## 2012-11-15 ENCOUNTER — Telehealth: Payer: Self-pay | Admitting: Internal Medicine

## 2012-11-15 NOTE — Telephone Encounter (Signed)
Caller: [name]/Patient; Phone: [phone]; Reason for Call: She was returning a missed call.  I saw the messages in emr about her labs.  I gave her all the information.  She said she has iron at home but she said she doesn't like to take it because it tears her stomach up.  I told her need to have repeat lab work in 2 months.  She will call back to schedule lab appt.  Also told her of Thyroid medicine being increased she was wondering where it was sent into I told her [facility], it is mail order so will take a few days for her to get it.

## 2012-12-28 DIAGNOSIS — D649 Anemia, unspecified: Secondary | ICD-10-CM | POA: Diagnosis not present

## 2012-12-28 DIAGNOSIS — K5730 Diverticulosis of large intestine without perforation or abscess without bleeding: Secondary | ICD-10-CM | POA: Diagnosis not present

## 2012-12-28 DIAGNOSIS — D175 Benign lipomatous neoplasm of intra-abdominal organs: Secondary | ICD-10-CM | POA: Diagnosis not present

## 2012-12-28 DIAGNOSIS — D126 Benign neoplasm of colon, unspecified: Secondary | ICD-10-CM | POA: Diagnosis not present

## 2012-12-28 DIAGNOSIS — R1084 Generalized abdominal pain: Secondary | ICD-10-CM | POA: Diagnosis not present

## 2012-12-28 DIAGNOSIS — R197 Diarrhea, unspecified: Secondary | ICD-10-CM | POA: Diagnosis not present

## 2012-12-28 DIAGNOSIS — K5710 Diverticulosis of small intestine without perforation or abscess without bleeding: Secondary | ICD-10-CM | POA: Diagnosis not present

## 2012-12-28 DIAGNOSIS — K219 Gastro-esophageal reflux disease without esophagitis: Secondary | ICD-10-CM | POA: Diagnosis not present

## 2013-01-16 ENCOUNTER — Ambulatory Visit (INDEPENDENT_AMBULATORY_CARE_PROVIDER_SITE_OTHER): Payer: Medicare Other | Admitting: Internal Medicine

## 2013-01-16 ENCOUNTER — Encounter: Payer: Self-pay | Admitting: Internal Medicine

## 2013-01-16 VITALS — BP 142/60 | HR 64 | Temp 98.4°F | Wt 174.0 lb

## 2013-01-16 DIAGNOSIS — I10 Essential (primary) hypertension: Secondary | ICD-10-CM | POA: Diagnosis not present

## 2013-01-16 DIAGNOSIS — D638 Anemia in other chronic diseases classified elsewhere: Secondary | ICD-10-CM | POA: Diagnosis not present

## 2013-01-16 DIAGNOSIS — L259 Unspecified contact dermatitis, unspecified cause: Secondary | ICD-10-CM | POA: Diagnosis not present

## 2013-01-16 DIAGNOSIS — E039 Hypothyroidism, unspecified: Secondary | ICD-10-CM | POA: Diagnosis not present

## 2013-01-16 DIAGNOSIS — L309 Dermatitis, unspecified: Secondary | ICD-10-CM | POA: Insufficient documentation

## 2013-01-16 LAB — TSH: TSH: 0.9 u[IU]/mL (ref 0.35–5.50)

## 2013-01-16 LAB — CBC WITH DIFFERENTIAL/PLATELET
Basophils Absolute: 0.1 10*3/uL (ref 0.0–0.1)
Eosinophils Absolute: 0.2 10*3/uL (ref 0.0–0.7)
Hemoglobin: 10.8 g/dL — ABNORMAL LOW (ref 12.0–15.0)
Lymphocytes Relative: 25.8 % (ref 12.0–46.0)
Lymphs Abs: 1.7 10*3/uL (ref 0.7–4.0)
MCHC: 33 g/dL (ref 30.0–36.0)
Neutro Abs: 4.1 10*3/uL (ref 1.4–7.7)
Platelets: 328 10*3/uL (ref 150.0–400.0)
RDW: 13.1 % (ref 11.5–14.6)

## 2013-01-16 LAB — BASIC METABOLIC PANEL
Calcium: 9.3 mg/dL (ref 8.4–10.5)
Chloride: 98 mEq/L (ref 96–112)
Creatinine, Ser: 1.3 mg/dL — ABNORMAL HIGH (ref 0.4–1.2)
Sodium: 130 mEq/L — ABNORMAL LOW (ref 135–145)

## 2013-01-16 MED ORDER — TRIAMCINOLONE ACETONIDE 0.1 % EX CREA
TOPICAL_CREAM | Freq: Two times a day (BID) | CUTANEOUS | Status: DC
Start: 2013-01-16 — End: 2013-11-29

## 2013-01-16 MED ORDER — AMLODIPINE BESYLATE 5 MG PO TABS: 5.0000 mg | ORAL_TABLET | Freq: Every day | ORAL | Status: DC

## 2013-01-16 MED ORDER — LEVOTHYROXINE SODIUM 150 MCG PO TABS: 150.0000 ug | ORAL_TABLET | Freq: Every day | ORAL | Status: DC

## 2013-01-16 MED ORDER — OLMESARTAN MEDOXOMIL-HCTZ 40-12.5 MG PO TABS: 1.0000 | ORAL_TABLET | Freq: Every day | ORAL | Status: DC

## 2013-01-16 NOTE — Assessment & Plan Note (Signed)
Patient's iron studies and B12 levels are normal. Check erythropoietin level. Monitor CBC. If worsening anemia, we discussed referral to hematologist for further evaluation.

## 2013-01-16 NOTE — Assessment & Plan Note (Signed)
Patient's blood pressure is stable. Her fatigue has improved. I doubt fatigue was secondary to antihypertensives. BP: 142/60 mmHg  Lab Results  Component Value Date   CREATININE 1.3* 01/16/2013

## 2013-01-16 NOTE — Assessment & Plan Note (Signed)
She has red scaly rash right lower leg. I suspect allergic eczema. Trial of topical steroids.

## 2013-01-16 NOTE — Progress Notes (Signed)
Subjective:    Patient ID: [name], female    DOB: 1929-05-23, 79 y.o.   MRN: [id number]  HPI   79 year old white female with history of hypertension and hypothyroidism for followup. At previous visit patient seen for complaints of progressive fatigue. Patient's normocytic anemia was stable. Her iron levels and B12 levels are both normal. She has history of hypothyroidism. Her thyroid medication was slightly increased. She reports feeling much better.  Hypertension-stable  She has noticed small rash right lower leg. She has history of varicose veins  Review of Systems  negative for chest pain or shortness of breath  Past Medical History  Diagnosis Date  . HYPOTHYROIDISM 04/04/2007  . HYPERTENSION 04/04/2007  . Chronic fatigue   . Depression   . Anemia     History   Social History  . Marital Status: Widowed    Spouse Name: N/A    Number of Children: N/A  . Years of Education: N/A   Occupational History  . Not on file.   Social History Main Topics  . Smoking status: Former Smoker -- 1.00 packs/day for 7 years    Quit date: 10/13/1969  . Smokeless tobacco: Not on file  . Alcohol Use: Not on file  . Drug Use: Not on file  . Sexually Active: Not on file   Other Topics Concern  . Not on file   Social History Narrative  . No narrative on file    Past Surgical History  Procedure Laterality Date  . Appendectomy    . Cholecystectomy    . Hernia repair    . Abdominal hysterectomy    . Spine surgery  2011    cervical  . Oophorectomy    . Back surgery  2012  . Orif tibia fracture  2012    dr duda    Family History  Problem Relation Age of Onset  . Heart disease Mother   . Cancer Mother 33    renal cell CA  . Lymphoma Son     Allergies  Allergen Reactions  . Oxycodone Hcl     REACTION: itching    Current Outpatient Prescriptions on File Prior to Visit  Medication Sig Dispense Refill  . clidinium-chlordiazePOXIDE (LIBRAX) 2.5-5 MG per capsule 2  (two) times daily as needed.       . fluticasone (FLONASE) 50 MCG/ACT nasal spray Place 2 sprays into the nose daily.  16 g  6  . levothyroxine (SYNTHROID, LEVOTHROID) 150 MCG tablet Take 1 tablet (150 mcg total) by mouth daily before breakfast.  90 tablet  1  . Multiple Vitamin (MULTIVITAMIN) tablet Take 1 tablet by mouth daily.      Marland Kitchen omeprazole (PRILOSEC) 40 MG capsule Take 2 tablets by mouth daily.      Marland Kitchen zolpidem (AMBIEN) 5 MG tablet Take 1 tablet (5 mg total) by mouth at bedtime as needed for sleep.  30 tablet  1   No current facility-administered medications on file prior to visit.    BP 142/60  Pulse 64  Temp(Src) 98.4 F (36.9 C) (Oral)  Wt 174 lb (78.926 kg)  BMI 31.82 kg/m2       Objective:   Physical Exam  Constitutional: She is oriented to person, place, and time. She appears well-developed and well-nourished.  HENT:  Head: Normocephalic and atraumatic.  Eyes: EOM are normal. Pupils are equal, round, and reactive to light.  Cardiovascular: Normal rate, regular rhythm and normal heart sounds.   Pulmonary/Chest: Effort normal and  breath sounds normal. She has no wheezes.  Musculoskeletal: She exhibits no edema.  Neurological: She is alert and oriented to person, place, and time. No cranial nerve deficit.  Skin: Skin is warm and dry.  Small area of redness and scaliness right lower leg  Psychiatric: She has a normal mood and affect. Her behavior is normal.          Assessment & Plan:

## 2013-01-16 NOTE — Assessment & Plan Note (Signed)
Fatigue symptoms improved with high dose of levothyroxine. Monitor thyroid function test.  Lab Results  Component Value Date   TSH 0.90 01/16/2013

## 2013-01-17 LAB — ERYTHROPOIETIN: Erythropoietin: 10.6 m[IU]/mL (ref 2.6–18.5)

## 2013-01-18 ENCOUNTER — Other Ambulatory Visit: Payer: Self-pay | Admitting: *Deleted

## 2013-01-18 ENCOUNTER — Telehealth: Payer: Self-pay | Admitting: Internal Medicine

## 2013-01-18 DIAGNOSIS — D649 Anemia, unspecified: Secondary | ICD-10-CM

## 2013-01-18 DIAGNOSIS — E871 Hypo-osmolality and hyponatremia: Secondary | ICD-10-CM

## 2013-01-18 MED ORDER — OLMESARTAN MEDOXOMIL 40 MG PO TABS: 40.0000 mg | ORAL_TABLET | Freq: Every day | ORAL | Status: DC

## 2013-01-18 NOTE — Telephone Encounter (Signed)
BMET - hyponatremia, we should also draw SPEP with immunofixation and UPEP re: normocyctic anemia, future order placed

## 2013-01-18 NOTE — Telephone Encounter (Signed)
PT called and stated that [name] called her for "something to do with her labs". She wasn't sure weather she needed to see the doctor or have more labs. Please instruct me on what to schedule with her. She also stated that she will be going out shortly and will call me back later. Thank you!

## 2013-01-18 NOTE — Telephone Encounter (Signed)
Pt called me back and scheduled lab appt

## 2013-01-20 ENCOUNTER — Telehealth: Payer: Self-pay | Admitting: Internal Medicine

## 2013-01-20 NOTE — Telephone Encounter (Signed)
PT called and stated that she has not picked up her Benicar, because its going to cost her 95$. She also cancelled her labs for Wednesday due tot he fact that she hasn't even started her benicar. Please assist.

## 2013-01-23 ENCOUNTER — Telehealth: Payer: Self-pay | Admitting: Internal Medicine

## 2013-01-23 NOTE — Telephone Encounter (Signed)
Pt came and got the medicine. Pt says to tell you "DR Artist Pais".

## 2013-01-23 NOTE — Telephone Encounter (Signed)
Pt has been seeing Dr Artist Pais for her BP.  Gave pt samples of Benicar 40 mg.  Pt states that the Benicar has been working for her BP and she wants to stay on it.  [facility] and [facility] gets [facility].  Pt will come back in 1 week after taking the regular Benicar for a repeat lab

## 2013-01-23 NOTE — Telephone Encounter (Signed)
Discontinue Benicar. Start losartan 50 mg by mouth daily #90, 3 refills. Schedule office visit with me or physician assistant in 4-6 weeks.

## 2013-01-25 ENCOUNTER — Other Ambulatory Visit: Payer: Medicare Other

## 2013-01-30 DIAGNOSIS — H0019 Chalazion unspecified eye, unspecified eyelid: Secondary | ICD-10-CM | POA: Diagnosis not present

## 2013-01-31 ENCOUNTER — Other Ambulatory Visit (INDEPENDENT_AMBULATORY_CARE_PROVIDER_SITE_OTHER): Payer: Medicare Other

## 2013-01-31 DIAGNOSIS — D649 Anemia, unspecified: Secondary | ICD-10-CM

## 2013-01-31 DIAGNOSIS — E871 Hypo-osmolality and hyponatremia: Secondary | ICD-10-CM

## 2013-02-02 LAB — BASIC METABOLIC PANEL
BUN: 17 mg/dL (ref 6–23)
Chloride: 104 mEq/L (ref 96–112)
GFR: 45.31 mL/min — ABNORMAL LOW (ref >60.00)
Glucose, Bld: 111 mg/dL — ABNORMAL HIGH (ref 70–99)
Potassium: 4.3 mEq/L (ref 3.5–5.1)

## 2013-02-06 LAB — PROTEIN ELECTROPHORESIS, SERUM
Alpha-2-Globulin: 9.7 % (ref 7.1–11.8)
Beta Globulin: 6.1 % (ref 4.7–7.2)
Gamma Globulin: 13.6 % (ref 11.1–18.8)

## 2013-02-06 LAB — PROTEIN ELECTROPHORESIS, URINE REFLEX

## 2013-02-09 ENCOUNTER — Encounter: Payer: Self-pay | Admitting: Internal Medicine

## 2013-02-13 DIAGNOSIS — Z961 Presence of intraocular lens: Secondary | ICD-10-CM | POA: Diagnosis not present

## 2013-02-13 DIAGNOSIS — H25099 Other age-related incipient cataract, unspecified eye: Secondary | ICD-10-CM | POA: Diagnosis not present

## 2013-02-13 DIAGNOSIS — H11159 Pinguecula, unspecified eye: Secondary | ICD-10-CM | POA: Diagnosis not present

## 2013-02-13 DIAGNOSIS — H04129 Dry eye syndrome of unspecified lacrimal gland: Secondary | ICD-10-CM | POA: Diagnosis not present

## 2013-02-16 ENCOUNTER — Telehealth: Payer: Self-pay | Admitting: Internal Medicine

## 2013-02-16 MED ORDER — OLMESARTAN MEDOXOMIL 40 MG PO TABS: 40.0000 mg | ORAL_TABLET | Freq: Every day | ORAL | Status: DC

## 2013-02-16 NOTE — Telephone Encounter (Signed)
PT called to request a 3 month supply, with 3 refills, of olmesartan (BENICAR) 40 MG tablet, be sent to [facility]. Pt states that this RX was supposed to have been sent to [facility] last month, but [facility]. She received samples from [place] to compensate, but they are almost gone. Please assist.

## 2013-02-16 NOTE — Telephone Encounter (Signed)
[facility]

## 2013-03-20 ENCOUNTER — Ambulatory Visit: Payer: Medicare Other | Admitting: Internal Medicine

## 2013-03-31 ENCOUNTER — Telehealth: Payer: Self-pay | Admitting: Internal Medicine

## 2013-03-31 DIAGNOSIS — R053 Chronic cough: Secondary | ICD-10-CM

## 2013-03-31 DIAGNOSIS — R05 Cough: Secondary | ICD-10-CM

## 2013-03-31 NOTE — Telephone Encounter (Signed)
Patient Information:  Caller Name: Shalla  Phone: [phone]  Patient: [name], [name]  Gender: Female  DOB: Aug 22, 1928  Age: 79 Years  PCP: Artist Pais Doe-Hyun Molly Maduro) (Adults only)  Office Follow Up:  Does the office need to follow up with this patient?: Yes  Instructions For The Office: Patient was referral to specialist krs/can  RN Note:  Patient states her cough has not improved, and continues for almost 2 years.  Uses allergy medication without improvement.  States she goes to church and disrupts the service with her coughing, and wants to know if she should see a specialist?  States has rightsided pain in her chest when she coughs as well.  States has some mild wheezing at times, but not currently, and has never been treated for asthma.  Cough is disruptive to sleep.  Per cough protocol, advised and offered appt today due to "continuous cough interferes with work or school and no improvement using cough treatment"; patient declines appt.  Would like Dr. Artist Pais to review and refer to specialist if he feels it is appropriate.  Info to office for provider review/callback.  May reach patient at [phone].  krs/can  Symptoms  Reason For Call & Symptoms: cough since 05/2011 and told it is allergy, but not improving.  Reviewed Health History In EMR: Yes  Reviewed Medications In EMR: Yes  Reviewed Allergies In EMR: Yes  Reviewed Surgeries / Procedures: Yes  Date of Onset of Symptoms: Unknown  Guideline(s) Used:  Cough  Disposition Per Guideline:   See Today or Tomorrow in Office  Reason For Disposition Reached:   Continuous (nonstop) coughing interferes with work or school and no improvement using cough treatment per Care Advice  Advice Given:  N/A  Patient Refused Recommendation:  Patient Refused Appt, Patient Requests Appt At Later Date  Patient was referral to specialist krs/can

## 2013-04-01 NOTE — Telephone Encounter (Signed)
Refer pulmonary

## 2013-04-02 ENCOUNTER — Other Ambulatory Visit: Payer: Self-pay | Admitting: Internal Medicine

## 2013-04-03 NOTE — Telephone Encounter (Signed)
Referral order placed.

## 2013-04-07 ENCOUNTER — Encounter: Payer: Self-pay | Admitting: Internal Medicine

## 2013-04-07 ENCOUNTER — Ambulatory Visit (INDEPENDENT_AMBULATORY_CARE_PROVIDER_SITE_OTHER): Payer: Medicare Other | Admitting: Internal Medicine

## 2013-04-07 VITALS — BP 130/70 | HR 70 | Temp 98.3°F | Ht 61.5 in | Wt 181.4 lb

## 2013-04-07 DIAGNOSIS — R05 Cough: Secondary | ICD-10-CM

## 2013-04-07 DIAGNOSIS — R059 Cough, unspecified: Secondary | ICD-10-CM | POA: Diagnosis not present

## 2013-04-07 MED ORDER — FAMOTIDINE 20 MG PO TABS: ORAL_TABLET | ORAL | Status: DC

## 2013-04-07 MED ORDER — BENZONATATE 200 MG PO CAPS: 200.0000 mg | ORAL_CAPSULE | Freq: Three times a day (TID) | ORAL | Status: DC | PRN

## 2013-04-07 NOTE — Progress Notes (Signed)
  Subjective:    Patient ID: [name], female    DOB: 05/01/1929  MRN: [id number]  HPI  38 yowf quit smoking 1972 with h/o pna as young child but did fine thereafter including sports up thru HS  with no h/o asthma or allergies with new pattern of cough x 2111/12 referred 04/07/2013 by Dr [name] to pulmonary clinic for eval of new cough.   04/07/2013 1st [facility] office visit/ [name] cc cough since ? When "definitely more than 3 years ago" (emr review suggests 07/2011)   initally while on blood pressure pills( did not include acei per emr med review)  never went completely away then much worse x 1.5 y. Cough present daily no worse when in [place] worse with singing, sometime worse when swallow assoc with dyphagia but nl esophageal fxn per pt per [facility].  "nothing ever makes it better"  Sometimes flare when lies down, productive of white,not present eyes pop open in am or typically awakening her once asleep.   No obvious pattern to  Day to day or  daytime variabilty or assoc sob or cp or chest tightness, subjective wheeze overt sinus or hb symptoms. No unusual exp hx or h/o childhood asthma or knowledge of premature birth.     Also denies any obvious fluctuation of symptoms with weather or environmental changes or other aggravating or alleviating factors except as outlined above   Current Medications, Allergies, Past Medical History, Past Surgical History, Family History, and Social History were reviewed in [employer] record.   Review of Systems  Constitutional: Negative for fever, chills and unexpected weight change.  HENT: Positive for sneezing, trouble swallowing and dental problem. Negative for ear pain, nosebleeds, congestion, sore throat, rhinorrhea, voice change, postnasal drip and sinus pressure.   Eyes: Negative for visual disturbance.  Respiratory: Positive for cough. Negative for choking and shortness of breath.   Cardiovascular: Negative for chest pain  and leg swelling.  Gastrointestinal: Positive for abdominal pain. Negative for vomiting and diarrhea.  Genitourinary: Negative for difficulty urinating.  Musculoskeletal: Negative for arthralgias.  Skin: Negative for rash.  Neurological: Negative for tremors, syncope and headaches.  Hematological: Does not bruise/bleed easily.       Objective:   Physical Exam  amb wf quite hoarse hopeless affect, did not cough during time she was in room  Wt Readings from Last 3 Encounters:  04/07/13 181 lb 6.4 oz (82.283 kg)  01/16/13 174 lb (78.926 kg)  11/11/12 180 lb (81.647 kg)     HEENT: nl dentition, turbinates, and orophanx. Nl external ear canals without cough reflex   NECK :  without JVD/Nodes/TM/ nl carotid upstrokes bilaterally   LUNGS: no acc muscle use, clear to A and P bilaterally without cough on insp or exp maneuvers   CV:  RRR  no s3 or murmur or increase in P2, no edema   ABD:  soft and nontender with nl excursion in the supine position. No bruits or organomegaly, bowel sounds nl  MS:  warm without deformities, calf tenderness, cyanosis or clubbing  SKIN: warm and dry without lesions    NEURO:  alert, approp, no deficits    No recent cxr on file        Assessment & Plan:

## 2013-04-07 NOTE — Patient Instructions (Addendum)
prilosec is 40 mg Take 30-60 min before first meal of the day and pepid 20 mg one at  Bedtime along with chlorpheniramine 4 mg one at bedtime  GERD (REFLUX)  is an extremely common cause of respiratory symptoms, many times with no significant heartburn at all.    It can be treated with medication, but also with lifestyle changes including avoidance of late meals, excessive alcohol, smoking cessation, and avoid fatty foods, chocolate, peppermint, colas, red wine, and acidic juices such as orange juice.  NO MINT OR MENTHOL PRODUCTS SO NO COUGH DROPS  USE SUGARLESS CANDY INSTEAD (jolley ranchers or Stover's)  NO OIL BASED VITAMINS - use powdered substitutes.    For cough > tessilon pearls 200 mg up every 4 hours as needed   Please schedule a follow up office visit in 4 weeks, sooner if needed with cxr.

## 2013-04-08 DIAGNOSIS — R059 Cough, unspecified: Secondary | ICD-10-CM | POA: Insufficient documentation

## 2013-04-08 DIAGNOSIS — R05 Cough: Secondary | ICD-10-CM | POA: Insufficient documentation

## 2013-04-08 NOTE — Assessment & Plan Note (Signed)
The most common causes of chronic cough in immunocompetent adults include the following: upper airway cough syndrome (UACS), previously referred to as postnasal drip syndrome (PNDS), which is caused by variety of rhinosinus conditions; (2) asthma; (3) GERD; (4) chronic bronchitis from cigarette smoking or other inhaled environmental irritants; (5) nonasthmatic eosinophilic bronchitis; and (6) bronchiectasis.   These conditions, singly or in combination, have accounted for up to 94% of the causes of chronic cough in prospective studies.   Other conditions have constituted no >6% of the causes in prospective studies These have included bronchogenic carcinoma, chronic interstitial pneumonia, sarcoidosis, left ventricular failure, ACEI-induced cough, and aspiration from a condition associated with pharyngeal dysfunction.    Chronic cough is often simultaneously caused by more than one condition. A single cause has been found from 38 to 82% of the time, multiple causes from 18 to 62%. Multiply caused cough has been the result of three diseases up to 42% of the time.       Assoc with dysphagia/ globus sensation with neg w/u per pt report per [facility] strongly suggests  Classic Upper airway cough syndrome, so named because it's frequently impossible to sort out how much is  CR/sinusitis with freq throat clearing (which can be related to primary GERD)   vs  causing  secondary (" extra esophageal")  GERD from wide swings in gastric pressure that occur with throat clearing, often  promoting self use of mint and menthol lozenges that reduce the lower esophageal sphincter tone and exacerbate the problem further in a cyclical fashion.   These are the same pts (now being labeled as having "irritable larynx syndrome" by some cough centers) who not infrequently have a history of having failed to tolerate ace inhibitors,  dry powder inhalers or biphosphonates or report having atypical reflux symptoms that don't respond to  standard doses of PPI , and are easily confused as having aecopd or asthma flares by even experienced allergists/ pulmonologists.    For now try to eliminate cyclical coughing with tessilon, max rx for pnds with h1 and h2 at hs and then regroup.  explained to pt and son [name]: The standardized cough guidelines published in Chest by [place] in 2006 are still the best available and consist of a multiple step process (up to 12!) , not a single office visit,  and are intended  to address this problem logically,  with an alogrithm dependent on response to empiric treatment at  each progressive step  to determine a specific diagnosis with  minimal addtional testing needed. Therefore if adherence is an issue or can't be accurately verified,  it's very unlikely the standard evaluation and treatment will be successful here.    Furthermore, response to therapy (other than acute cough suppression, which should only be used short term with avoidance of narcotic containing cough syrups if possible), can be a gradual process for which the patient may perceive immediate benefit.  Unlike going to an eye doctor where the best perscription is almost always the first one and is immediately effective, this is almost never the case in the management of chronic cough syndromes. Therefore the patient needs to commit up front to consistently adhere to recommendations  for up to 6 weeks of therapy directed at the likely underlying problem(s) before the response can be reasonably evaluated.

## 2013-04-11 ENCOUNTER — Telehealth: Payer: Self-pay | Admitting: Internal Medicine

## 2013-04-11 NOTE — Telephone Encounter (Signed)
ATC PT NA unable to leave VM WCB 

## 2013-04-12 NOTE — Telephone Encounter (Signed)
Pt advised that chlor tabs are OTC. Carron Curie, CMA

## 2013-05-04 ENCOUNTER — Telehealth: Payer: Self-pay | Admitting: Internal Medicine

## 2013-05-04 MED ORDER — ZOLPIDEM TARTRATE 5 MG PO TABS: 2.5000 mg | ORAL_TABLET | Freq: Every evening | ORAL | Status: DC | PRN

## 2013-05-04 NOTE — Telephone Encounter (Signed)
Ok per Dr Swords, rx called in 

## 2013-05-04 NOTE — Telephone Encounter (Signed)
Pt requesting refill of zolpidem (AMBIEN) 5 MG tablet sent to [facility].

## 2013-05-05 ENCOUNTER — Ambulatory Visit: Payer: Medicare Other | Admitting: Internal Medicine

## 2013-06-06 ENCOUNTER — Ambulatory Visit: Payer: Medicare Other | Admitting: Internal Medicine

## 2013-06-16 ENCOUNTER — Ambulatory Visit: Payer: Medicare Other | Admitting: Internal Medicine

## 2013-06-20 ENCOUNTER — Other Ambulatory Visit: Payer: Self-pay | Admitting: *Deleted

## 2013-06-20 MED ORDER — AMLODIPINE BESYLATE 5 MG PO TABS: 5.0000 mg | ORAL_TABLET | Freq: Every day | ORAL | Status: DC

## 2013-06-20 MED ORDER — LEVOTHYROXINE SODIUM 150 MCG PO TABS: 150.0000 ug | ORAL_TABLET | Freq: Every day | ORAL | Status: DC

## 2013-06-22 ENCOUNTER — Encounter: Payer: Self-pay | Admitting: Internal Medicine

## 2013-06-23 ENCOUNTER — Encounter: Payer: Self-pay | Admitting: Internal Medicine

## 2013-06-23 ENCOUNTER — Ambulatory Visit (INDEPENDENT_AMBULATORY_CARE_PROVIDER_SITE_OTHER): Payer: Medicare Other | Admitting: Internal Medicine

## 2013-06-23 VITALS — BP 178/70 | HR 68 | Temp 98.5°F | Ht 61.5 in | Wt 174.0 lb

## 2013-06-23 DIAGNOSIS — I10 Essential (primary) hypertension: Secondary | ICD-10-CM | POA: Diagnosis not present

## 2013-06-23 DIAGNOSIS — M25511 Pain in right shoulder: Secondary | ICD-10-CM

## 2013-06-23 DIAGNOSIS — N289 Disorder of kidney and ureter, unspecified: Secondary | ICD-10-CM

## 2013-06-23 DIAGNOSIS — D638 Anemia in other chronic diseases classified elsewhere: Secondary | ICD-10-CM

## 2013-06-23 DIAGNOSIS — E039 Hypothyroidism, unspecified: Secondary | ICD-10-CM | POA: Diagnosis not present

## 2013-06-23 DIAGNOSIS — Z23 Encounter for immunization: Secondary | ICD-10-CM

## 2013-06-23 DIAGNOSIS — R413 Other amnesia: Secondary | ICD-10-CM

## 2013-06-23 DIAGNOSIS — M25519 Pain in unspecified shoulder: Secondary | ICD-10-CM

## 2013-06-23 LAB — BASIC METABOLIC PANEL
CO2: 26 mEq/L (ref 19–32)
Calcium: 9.5 mg/dL (ref 8.4–10.5)
Chloride: 107 mEq/L (ref 96–112)
Glucose, Bld: 95 mg/dL (ref 70–99)
Sodium: 138 mEq/L (ref 135–145)

## 2013-06-23 MED ORDER — FLUTICASONE PROPIONATE 50 MCG/ACT NA SUSP: 2.0000 | Freq: Every day | NASAL | Status: DC

## 2013-06-23 NOTE — Patient Instructions (Signed)
Please take your blood pressure medications regularly.

## 2013-06-23 NOTE — Assessment & Plan Note (Signed)
She has chronic right shoulder pain.  I suspect DJD and possible rotator cuff tendinitis.  Use samples of Voltaren gel as directed.  If no improvement, consider cortisone injection.

## 2013-06-23 NOTE — Progress Notes (Signed)
Pre visit review using our clinic review tool, if applicable. No additional management support is needed unless otherwise documented below in the visit note. 

## 2013-06-23 NOTE — Assessment & Plan Note (Signed)
Monitor CBC.  Check erythropoietin level.  Consider hematology consult.

## 2013-06-23 NOTE — Assessment & Plan Note (Signed)
Patient experiencing memory loss.  Previous B12 and TSH level normal.  We discussed performing mini mental status exam at next OV.  We discussed currently available medications.  Consider neuro imaging.

## 2013-06-23 NOTE — Assessment & Plan Note (Signed)
I stressed importance of medication compliance. Memory loss may be contributing to suboptimal medication compliance.  BP: 178/70 mmHg

## 2013-06-23 NOTE — Progress Notes (Signed)
Subjective:    Patient ID: [name], female    DOB: 1929-04-12, 79 y.o.   MRN: [id number]  HPI  79 year old while female with hx of hypertension and hypothyroidism for follow up .  Patient forgot to take her BP medication today.  She was rushing to her appt.   She admits to forgetting to take her BP meds at least once per week despite using pill box.    She admits to problems with memory loss.  She manages her own checking account but her daughter helps her balance check book.  She complains of right shoulder pain near [facility] joint.  She is able to raise right arm above head.  No hx of trauma or injury.   Review of Systems  Negative for chest pain,  Negative for shortness of breath.  She lives by herself  Past Medical History  Diagnosis Date  . HYPOTHYROIDISM 04/04/2007  . HYPERTENSION 04/04/2007  . Chronic fatigue   . Depression   . Anemia     History   Social History  . Marital Status: Widowed    Spouse Name: N/A    Number of Children: N/A  . Years of Education: N/A   Occupational History  . Retired    Social History Main Topics  . Smoking status: Former Smoker -- 1.00 packs/day for 7 years    Types: Cigarettes    Quit date: 10/13/1969  . Smokeless tobacco: Not on file  . Alcohol Use: No  . Drug Use: No  . Sexual Activity: Not on file   Other Topics Concern  . Not on file   Social History Narrative  . No narrative on file    Past Surgical History  Procedure Laterality Date  . Appendectomy    . Cholecystectomy    . Hernia repair    . Abdominal hysterectomy    . Spine surgery  2011    cervical  . Oophorectomy    . Back surgery  2012  . Orif tibia fracture  2012    dr duda    Family History  Problem Relation Age of Onset  . Heart disease Mother   . Cancer Mother 16    renal cell CA  . Lymphoma Son   . Heart disease Father     Allergies  Allergen Reactions  . Oxycodone Hcl     REACTION: itching    Current Outpatient Prescriptions on File  Prior to Visit  Medication Sig Dispense Refill  . amLODipine (NORVASC) 5 MG tablet Take 1 tablet (5 mg total) by mouth daily.  90 tablet  1  . clidinium-chlordiazePOXIDE (LIBRAX) 2.5-5 MG per capsule 2 (two) times daily as needed.       Marland Kitchen levothyroxine (SYNTHROID, LEVOTHROID) 150 MCG tablet Take 1 tablet (150 mcg total) by mouth daily before breakfast.  90 tablet  1  . Multiple Vitamin (MULTIVITAMIN) tablet Take 1 tablet by mouth daily.      Marland Kitchen olmesartan (BENICAR) 40 MG tablet Take 1 tablet (40 mg total) by mouth daily.  90 tablet  3  . omeprazole (PRILOSEC) 40 MG capsule Take 1 tablet by mouth daily before breakfast.       . triamcinolone cream (KENALOG) 0.1 % Apply topically 2 (two) times daily.  30 g  1  . zolpidem (AMBIEN) 5 MG tablet Take 0.5 tablets (2.5 mg total) by mouth at bedtime as needed for sleep.  30 tablet  5   No current facility-administered medications on  file prior to visit.    BP 178/70  Pulse 68  Temp(Src) 98.5 F (36.9 C) (Oral)  Ht 5' 1.5" (1.562 m)  Wt 174 lb (78.926 kg)  BMI 32.35 kg/m2       Objective:   Physical Exam        Assessment & Plan:

## 2013-06-26 LAB — ERYTHROPOIETIN: Erythropoietin: 13.4 m[IU]/mL (ref 2.6–18.5)

## 2013-06-27 ENCOUNTER — Other Ambulatory Visit: Payer: Self-pay | Admitting: *Deleted

## 2013-06-27 MED ORDER — LEVOTHYROXINE SODIUM 137 MCG PO TABS: 137.0000 ug | ORAL_TABLET | Freq: Every day | ORAL | Status: DC

## 2013-07-07 ENCOUNTER — Telehealth: Payer: Self-pay | Admitting: *Deleted

## 2013-07-07 ENCOUNTER — Ambulatory Visit: Payer: Medicare Other | Admitting: Family Medicine

## 2013-07-07 ENCOUNTER — Telehealth: Payer: Self-pay

## 2013-07-07 NOTE — Telephone Encounter (Addendum)
Got information from [facility] and checked [facility], [facility] and [place] schedules with no available appointments. Spoke with Ermalinda Barrios who advises to discuss symptoms with patient and advises opening in [place] at 3 pm with Dr.McGowan. Spoke with patient and rechecked her BP and it was 160/64. Discussed symptoms with patient and she said she did not feel well, she is having diarrhea and not much appetite, she has drank some coffee and drinking water and is able to keep it down. Says she has a cheese burger and a coke in the car from [place], but had not eaten it yet. Denied Chest pain, Denied Sob, Denied Dizziness, Denied Headache. I discussed with Dr.Paz and he advises for the patient to take 2 amlodipine each day this weekend and follow up on Monday with Dr.Yoo at 2:15. The apt has been scheduled. I discussed with the patient to go to Sat clinic at [facility] or the ED if the symptoms worsen and she agrees to do so.      KP

## 2013-07-07 NOTE — Telephone Encounter (Signed)
thx

## 2013-07-07 NOTE — Telephone Encounter (Signed)
Patient came in today for a blood pressure check. Patient states that she went to the drug store and took her blood pressure there and it read 178/62.  Here at the office her blood pressure read 170/80. Patient states that she "just feel good".

## 2013-07-07 NOTE — Telephone Encounter (Signed)
Error.     KP 

## 2013-07-10 ENCOUNTER — Ambulatory Visit (INDEPENDENT_AMBULATORY_CARE_PROVIDER_SITE_OTHER): Payer: Medicare Other | Admitting: Internal Medicine

## 2013-07-10 ENCOUNTER — Encounter: Payer: Self-pay | Admitting: Internal Medicine

## 2013-07-10 VITALS — BP 180/70 | HR 72 | Temp 98.5°F | Ht 61.5 in | Wt 173.0 lb

## 2013-07-10 DIAGNOSIS — E039 Hypothyroidism, unspecified: Secondary | ICD-10-CM

## 2013-07-10 DIAGNOSIS — I10 Essential (primary) hypertension: Secondary | ICD-10-CM | POA: Diagnosis not present

## 2013-07-10 MED ORDER — AMLODIPINE BESYLATE 10 MG PO TABS: 10.0000 mg | ORAL_TABLET | Freq: Every day | ORAL | Status: DC

## 2013-07-10 MED ORDER — HYDROCHLOROTHIAZIDE 12.5 MG PO CAPS: 12.5000 mg | ORAL_CAPSULE | Freq: Every day | ORAL | Status: DC

## 2013-07-10 MED ORDER — LEVOTHYROXINE SODIUM 137 MCG PO TABS: 137.0000 ug | ORAL_TABLET | Freq: Every day | ORAL | Status: DC

## 2013-07-10 NOTE — Patient Instructions (Signed)
Please complete the following lab tests before your next follow up appointment: BMET - 401.9 Our office will contact you re: scheduling renal artery ultrasound

## 2013-07-10 NOTE — Assessment & Plan Note (Addendum)
Malignant hypertension.  Continue higher dose of amlodipine.  Continue Benicar 40 mg.  Add Hctz 12.5 mg.  If poor response, consider add hydralazine 25 mg tid.  Check renal artery doppler.

## 2013-07-10 NOTE — Progress Notes (Signed)
Subjective:    Patient ID: [name], female    DOB: [birth year], 79 y.o.   MRN: [id number]  HPI  79 year old white female with history of hypertension and hypothyroidism presents with uncontrolled blood pressure.  She reports good medication compliance.  Her home SBP 190-200.  She increased her amlodipine to 10 mg and BP is still high.    She denies using any OTC medication  Hypothyroidism - she notes more muscle cramps with lower dose of thyroid replacement.   Review of Systems Negative for headache.  Negative for chest pain.    Past Medical History  Diagnosis Date  . HYPOTHYROIDISM 04/04/2007  . HYPERTENSION 04/04/2007  . Chronic fatigue   . Depression   . Anemia     History   Social History  . Marital Status: Widowed    Spouse Name: N/A    Number of Children: N/A  . Years of Education: N/A   Occupational History  . Retired    Social History Main Topics  . Smoking status: Former Smoker -- 1.00 packs/day for 7 years    Types: Cigarettes    Quit date: 10/13/1969  . Smokeless tobacco: Not on file  . Alcohol Use: No  . Drug Use: No  . Sexual Activity: Not on file   Other Topics Concern  . Not on file   Social History Narrative  . No narrative on file    Past Surgical History  Procedure Laterality Date  . Appendectomy    . Cholecystectomy    . Hernia repair    . Abdominal hysterectomy    . Spine surgery  2011    cervical  . Oophorectomy    . Back surgery  2012  . Orif tibia fracture  2012    dr duda    Family History  Problem Relation Age of Onset  . Heart disease Mother   . Cancer Mother 84    renal cell CA  . Lymphoma Son   . Heart disease Father     Allergies  Allergen Reactions  . Oxycodone Hcl     REACTION: itching    Current Outpatient Prescriptions on File Prior to Visit  Medication Sig Dispense Refill  . clidinium-chlordiazePOXIDE (LIBRAX) 2.5-5 MG per capsule 2 (two) times daily as needed.       . fluticasone (FLONASE) 50  MCG/ACT nasal spray Place 2 sprays into both nostrils daily.  16 g  5  . Multiple Vitamin (MULTIVITAMIN) tablet Take 1 tablet by mouth daily.      Marland Kitchen olmesartan (BENICAR) 40 MG tablet Take 1 tablet (40 mg total) by mouth daily.  90 tablet  3  . omeprazole (PRILOSEC) 40 MG capsule Take 1 tablet by mouth daily before breakfast.       . triamcinolone cream (KENALOG) 0.1 % Apply topically 2 (two) times daily.  30 g  1  . zolpidem (AMBIEN) 5 MG tablet Take 0.5 tablets (2.5 mg total) by mouth at bedtime as needed for sleep.  30 tablet  5   No current facility-administered medications on file prior to visit.    BP 180/70  Pulse 72  Temp(Src) 98.5 F (36.9 C) (Oral)  Ht 5' 1.5" (1.562 m)  Wt 173 lb (78.472 kg)  BMI 32.16 kg/m2      Objective:   Physical Exam  Constitutional: She is oriented to person, place, and time. She appears well-developed and well-nourished.  HENT:  Head: Normocephalic and atraumatic.  Cardiovascular: Normal  rate, regular rhythm and normal heart sounds.   No murmur heard. Pulmonary/Chest: Effort normal and breath sounds normal. She has no rales.  Musculoskeletal: She exhibits edema.  Neurological: She is alert and oriented to person, place, and time. No cranial nerve deficit.  Psychiatric: She has a normal mood and affect. Her behavior is normal.          Assessment & Plan:

## 2013-07-10 NOTE — Progress Notes (Signed)
Pre visit review using our clinic review tool, if applicable. No additional management support is needed unless otherwise documented below in the visit note. 

## 2013-07-10 NOTE — Assessment & Plan Note (Signed)
Continue levothyroxine 137 mcg once daily.   Lab Results  Component Value Date   TSH 0.28* 06/23/2013

## 2013-07-11 DIAGNOSIS — M545 Low back pain, unspecified: Secondary | ICD-10-CM | POA: Diagnosis not present

## 2013-07-11 DIAGNOSIS — M67919 Unspecified disorder of synovium and tendon, unspecified shoulder: Secondary | ICD-10-CM | POA: Diagnosis not present

## 2013-07-11 DIAGNOSIS — M1710 Unilateral primary osteoarthritis, unspecified knee: Secondary | ICD-10-CM | POA: Diagnosis not present

## 2013-07-14 ENCOUNTER — Ambulatory Visit (HOSPITAL_COMMUNITY): Payer: Medicare Other | Attending: Internal Medicine

## 2013-07-14 DIAGNOSIS — I10 Essential (primary) hypertension: Secondary | ICD-10-CM | POA: Insufficient documentation

## 2013-07-14 DIAGNOSIS — N289 Disorder of kidney and ureter, unspecified: Secondary | ICD-10-CM | POA: Insufficient documentation

## 2013-07-14 DIAGNOSIS — I700 Atherosclerosis of aorta: Secondary | ICD-10-CM | POA: Insufficient documentation

## 2013-07-18 ENCOUNTER — Other Ambulatory Visit (INDEPENDENT_AMBULATORY_CARE_PROVIDER_SITE_OTHER): Payer: Medicare Other

## 2013-07-18 DIAGNOSIS — I10 Essential (primary) hypertension: Secondary | ICD-10-CM | POA: Diagnosis not present

## 2013-07-18 LAB — BASIC METABOLIC PANEL
CO2: 23 mEq/L (ref 19–32)
Calcium: 8.9 mg/dL (ref 8.4–10.5)
Chloride: 101 mEq/L (ref 96–112)
Creatinine, Ser: 1.4 mg/dL — ABNORMAL HIGH (ref 0.4–1.2)
Glucose, Bld: 144 mg/dL — ABNORMAL HIGH (ref 70–99)
Sodium: 133 mEq/L — ABNORMAL LOW (ref 135–145)

## 2013-07-21 ENCOUNTER — Ambulatory Visit: Payer: Medicare Other | Admitting: Internal Medicine

## 2013-07-25 DIAGNOSIS — M1710 Unilateral primary osteoarthritis, unspecified knee: Secondary | ICD-10-CM | POA: Diagnosis not present

## 2013-08-09 ENCOUNTER — Ambulatory Visit (INDEPENDENT_AMBULATORY_CARE_PROVIDER_SITE_OTHER): Payer: Medicare Other | Admitting: Internal Medicine

## 2013-08-09 ENCOUNTER — Encounter: Payer: Self-pay | Admitting: Internal Medicine

## 2013-08-09 VITALS — BP 160/64 | HR 68 | Temp 98.8°F | Ht 61.5 in | Wt 175.0 lb

## 2013-08-09 DIAGNOSIS — F329 Major depressive disorder, single episode, unspecified: Secondary | ICD-10-CM | POA: Diagnosis not present

## 2013-08-09 DIAGNOSIS — G4700 Insomnia, unspecified: Secondary | ICD-10-CM | POA: Diagnosis not present

## 2013-08-09 DIAGNOSIS — F5104 Psychophysiologic insomnia: Secondary | ICD-10-CM

## 2013-08-09 DIAGNOSIS — I10 Essential (primary) hypertension: Secondary | ICD-10-CM

## 2013-08-09 DIAGNOSIS — F3289 Other specified depressive episodes: Secondary | ICD-10-CM

## 2013-08-09 MED ORDER — POTASSIUM CHLORIDE CRYS ER 20 MEQ PO TBCR: 20.0000 meq | EXTENDED_RELEASE_TABLET | Freq: Every day | ORAL | Status: DC

## 2013-08-09 MED ORDER — ZOLPIDEM TARTRATE 5 MG PO TABS: 5.0000 mg | ORAL_TABLET | Freq: Every evening | ORAL | Status: DC | PRN

## 2013-08-09 MED ORDER — FUROSEMIDE 20 MG PO TABS: 20.0000 mg | ORAL_TABLET | Freq: Every day | ORAL | Status: DC

## 2013-08-09 NOTE — Assessment & Plan Note (Signed)
Patient has situational depression secondary to loss of her son 1 year ago. She declines  antidepressants. Patient will reconsider if her symptoms worsen.

## 2013-08-09 NOTE — Progress Notes (Signed)
Pre visit review using our clinic review tool, if applicable. No additional management support is needed unless otherwise documented below in the visit note. 

## 2013-08-09 NOTE — Patient Instructions (Signed)
Please complete the following lab tests before your next follow up appointment: BMET - 401.9 

## 2013-08-09 NOTE — Assessment & Plan Note (Addendum)
Patient has been using zolpidem 2.5 mg at bedtime. Patient only able to get 2 or 3 hours of sleep. Resume zolpidem 5 mg at bedtime as needed.  Consider low dose Remeron.

## 2013-08-09 NOTE — Assessment & Plan Note (Signed)
Patient's blood pressure is still suboptimally controlled. Mild improvement with addition of hydrochlorothiazide. However she has mild hyponatremia which is likely secondary to HCTZ. Discontinue HCTZ. Start furosemide 20 mg once daily. She understands she may require higher dose. Start potassium supplementation 20 mEq once daily. Recent metabolic panel before next office visit.  BP: 160/64 mmHg   Patient's renal artery Doppler was negative for renal artery stenosis.

## 2013-08-09 NOTE — Progress Notes (Signed)
Subjective:    Patient ID: [name], female    DOB: 1929-03-10, 79 y.o.   MRN: [id number]  HPI  79 year old white female for followup regarding accelerated hypertension. At previous visit, hydrochlorothiazide 12.5 mg was added to her regimen. Despite restarting diuretic, patient reports her lower extremity edema is unchanged. Her blood pressure is only slightly improved.  Her serum creatinine is slightly worse. She has mild hyponatremia. Her renal artery Doppler was negative for renal artery stenosis. She has mild atherosclerotic changes of aorta.  Patient has been under more stress during the holidays. She has family visiting. Her son passed away one year ago. She still degrees his death. The holidays are especially difficult for her. However she did not denies any  Significant depressive symptoms.  She has chronic insomnia. She has only been using zolpidem 2.5 mg at bedtime. She usually awakens around 2 to 3 AM.   Review of Systems Negative for chest pain.  Negative for shortness of breath.  Poor short term memory    Past Medical History  Diagnosis Date  . HYPOTHYROIDISM 04/04/2007  . HYPERTENSION 04/04/2007  . Chronic fatigue   . Depression   . Anemia     History   Social History  . Marital Status: Widowed    Spouse Name: N/A    Number of Children: N/A  . Years of Education: N/A   Occupational History  . Retired    Social History Main Topics  . Smoking status: Former Smoker -- 1.00 packs/day for 7 years    Types: Cigarettes    Quit date: 10/13/1969  . Smokeless tobacco: Not on file  . Alcohol Use: No  . Drug Use: No  . Sexual Activity: Not on file   Other Topics Concern  . Not on file   Social History Narrative  . No narrative on file    Past Surgical History  Procedure Laterality Date  . Appendectomy    . Cholecystectomy    . Hernia repair    . Abdominal hysterectomy    . Spine surgery  2011    cervical  . Oophorectomy    . Back surgery  2012  .  Orif tibia fracture  2012    dr duda    Family History  Problem Relation Age of Onset  . Heart disease Mother   . Cancer Mother 57    renal cell CA  . Lymphoma Son   . Heart disease Father     Allergies  Allergen Reactions  . Oxycodone Hcl     REACTION: itching    Current Outpatient Prescriptions on File Prior to Visit  Medication Sig Dispense Refill  . amLODipine (NORVASC) 10 MG tablet Take 1 tablet (10 mg total) by mouth daily.  90 tablet  1  . clidinium-chlordiazePOXIDE (LIBRAX) 2.5-5 MG per capsule 2 (two) times daily as needed.       . fluticasone (FLONASE) 50 MCG/ACT nasal spray Place 2 sprays into both nostrils daily.  16 g  5  . levothyroxine (SYNTHROID) 137 MCG tablet Take 1 tablet (137 mcg total) by mouth daily before breakfast.  90 tablet  1  . Multiple Vitamin (MULTIVITAMIN) tablet Take 1 tablet by mouth daily.      Marland Kitchen olmesartan (BENICAR) 40 MG tablet Take 1 tablet (40 mg total) by mouth daily.  90 tablet  3  . omeprazole (PRILOSEC) 40 MG capsule Take 1 tablet by mouth daily before breakfast.       .  triamcinolone cream (KENALOG) 0.1 % Apply topically 2 (two) times daily.  30 g  1   No current facility-administered medications on file prior to visit.    BP 160/64  Pulse 68  Temp(Src) 98.8 F (37.1 C) (Oral)  Ht 5' 1.5" (1.562 m)  Wt 175 lb (79.379 kg)  BMI 32.53 kg/m2    Objective:   Physical Exam  Constitutional: She is oriented to person, place, and time. She appears well-developed and well-nourished. No distress.  HENT:  Head: Normocephalic and atraumatic.  Neck: Neck supple.  No carotid bruit  Cardiovascular: Normal rate, regular rhythm and normal heart sounds.   No murmur heard. Pulmonary/Chest: Effort normal and breath sounds normal. She has no wheezes.  Musculoskeletal: She exhibits edema.  Lymphadenopathy:    She has no cervical adenopathy.  Neurological: She is alert and oriented to person, place, and time. No cranial nerve deficit.  Skin:  Skin is warm and dry.  Psychiatric: Her behavior is normal.  Slight flat affect          Assessment & Plan:

## 2013-11-15 ENCOUNTER — Other Ambulatory Visit (INDEPENDENT_AMBULATORY_CARE_PROVIDER_SITE_OTHER): Payer: Medicare Other

## 2013-11-15 ENCOUNTER — Encounter: Payer: Self-pay | Admitting: Internal Medicine

## 2013-11-15 ENCOUNTER — Telehealth: Payer: Self-pay | Admitting: Internal Medicine

## 2013-11-15 ENCOUNTER — Ambulatory Visit (INDEPENDENT_AMBULATORY_CARE_PROVIDER_SITE_OTHER): Payer: Medicare Other | Admitting: Internal Medicine

## 2013-11-15 VITALS — BP 142/50 | HR 71 | Temp 98.7°F | Wt 177.5 lb

## 2013-11-15 DIAGNOSIS — R609 Edema, unspecified: Secondary | ICD-10-CM

## 2013-11-15 DIAGNOSIS — N289 Disorder of kidney and ureter, unspecified: Secondary | ICD-10-CM

## 2013-11-15 DIAGNOSIS — I10 Essential (primary) hypertension: Secondary | ICD-10-CM | POA: Diagnosis not present

## 2013-11-15 DIAGNOSIS — R011 Cardiac murmur, unspecified: Secondary | ICD-10-CM | POA: Diagnosis not present

## 2013-11-15 DIAGNOSIS — R600 Localized edema: Secondary | ICD-10-CM

## 2013-11-15 LAB — CBC WITH DIFFERENTIAL/PLATELET
Basophils Absolute: 0.1 10*3/uL (ref 0.0–0.1)
Basophils Relative: 1 % (ref 0.0–3.0)
EOS ABS: 0.3 10*3/uL (ref 0.0–0.7)
Eosinophils Relative: 3.4 % (ref 0.0–5.0)
HCT: 31.6 % — ABNORMAL LOW (ref 36.0–46.0)
Hemoglobin: 10.7 g/dL — ABNORMAL LOW (ref 12.0–15.0)
LYMPHS ABS: 2 10*3/uL (ref 0.7–4.0)
Lymphocytes Relative: 25.1 % (ref 12.0–46.0)
MCHC: 33.9 g/dL (ref 30.0–36.0)
MCV: 92 fl (ref 78.0–100.0)
MONO ABS: 0.7 10*3/uL (ref 0.1–1.0)
Monocytes Relative: 9.2 % (ref 3.0–12.0)
NEUTROS ABS: 5 10*3/uL (ref 1.4–7.7)
Neutrophils Relative %: 61.3 % (ref 43.0–77.0)
PLATELETS: 293 10*3/uL (ref 150.0–400.0)
RBC: 3.43 Mil/uL — ABNORMAL LOW (ref 3.87–5.11)
RDW: 12.9 % (ref 11.5–14.6)
WBC: 8.1 10*3/uL (ref 4.5–10.5)

## 2013-11-15 LAB — LIPID PANEL
CHOL/HDL RATIO: 4
Cholesterol: 160 mg/dL (ref 0–200)
HDL: 40.1 mg/dL (ref >39.00)
LDL Cholesterol: 99 mg/dL (ref 0–99)
Triglycerides: 103 mg/dL (ref 0.0–149.0)
VLDL: 20.6 mg/dL (ref 0.0–40.0)

## 2013-11-15 LAB — URINALYSIS, ROUTINE W REFLEX MICROSCOPIC
BILIRUBIN URINE: NEGATIVE
Hgb urine dipstick: NEGATIVE
Ketones, ur: NEGATIVE
LEUKOCYTES UA: NEGATIVE
Nitrite: NEGATIVE
RBC / HPF: NONE SEEN (ref 0–?)
Specific Gravity, Urine: 1.01 (ref 1.000–1.030)
Total Protein, Urine: NEGATIVE
URINE GLUCOSE: NEGATIVE
UROBILINOGEN UA: 0.2 (ref 0.0–1.0)
pH: 6 (ref 5.0–8.0)

## 2013-11-15 LAB — HEPATIC FUNCTION PANEL
ALK PHOS: 66 U/L (ref 39–117)
ALT: 18 U/L (ref 0–35)
AST: 19 U/L (ref 0–37)
Albumin: 4.3 g/dL (ref 3.5–5.2)
BILIRUBIN DIRECT: 0.1 mg/dL (ref 0.0–0.3)
Total Bilirubin: 0.6 mg/dL (ref 0.3–1.2)
Total Protein: 7 g/dL (ref 6.0–8.3)

## 2013-11-15 LAB — BASIC METABOLIC PANEL
BUN: 26 mg/dL — AB (ref 6–23)
CALCIUM: 9.3 mg/dL (ref 8.4–10.5)
CO2: 26 mEq/L (ref 19–32)
Chloride: 100 mEq/L (ref 96–112)
Creatinine, Ser: 1.6 mg/dL — ABNORMAL HIGH (ref 0.4–1.2)
GFR: 33.73 mL/min — AB (ref >60.00)
Glucose, Bld: 102 mg/dL — ABNORMAL HIGH (ref 70–99)
Potassium: 4.7 mEq/L (ref 3.5–5.1)
Sodium: 133 mEq/L — ABNORMAL LOW (ref 135–145)

## 2013-11-15 LAB — TSH: TSH: 0.58 u[IU]/mL (ref 0.35–5.50)

## 2013-11-15 MED ORDER — FUROSEMIDE 40 MG PO TABS: 40.0000 mg | ORAL_TABLET | Freq: Every day | ORAL | Status: DC

## 2013-11-15 NOTE — Telephone Encounter (Signed)
Patient Information:  Caller Name: Kyri  Phone: [phone]  Patient: [name], [name]  Gender: Female  DOB: Jun 22, 1929  Age: 80 Years  PCP: Shawna Orleans Doe-Hyun Herbie [place]) (Adults only)  Office Follow Up:  Does the office need to follow up with this patient?: No  Instructions For The Office: N/A  RN Note:  PT states she will only come to office for appt. No appt available at [facility]. Appt with Dr. Judi Cong at [phone]. Care advice given and pt to call 911 with any chx or worsening of sx.  Symptoms  Reason For Call & Symptoms: Pt calling regaridng elevated bp since 11/14/13. Pt states her head feels strange since 11/13/13. Dizziness and head feels funny when she bends over. BP now at 11/15/13: 1701/68. Has taken scheduled routine BP meds as directed. Pt states this systolic pressure is not abnormal for her.  Reviewed Health History In EMR: Yes  Reviewed Medications In EMR: Yes  Reviewed Allergies In EMR: Yes  Reviewed Surgeries / Procedures: Yes  Date of Onset of Symptoms: 11/13/2013  Guideline(s) Used:  High Blood Pressure  Disposition Per Guideline:   Go to ED Now (or to Office with PCP Approval)  Reason For Disposition Reached:   BP > 160 / 100 and any cardiac or neurologic symptoms (e.g., chest pain, difficulty breathing, unsteady gait, blurred vision)  Advice Given:  N/A  Patient Will Follow Care Advice:  YES  Appointment Scheduled:  11/15/2013 15:30:00 Appointment Scheduled Provider:  Cathlean Cower

## 2013-11-15 NOTE — Progress Notes (Signed)
Subjective:    Patient ID: [name], female    DOB: [birth year], 80 y.o.   MRN: [id number]  HPI  Here to f/u as Dr Shawna Orleans not available for appt until may 1 per pt, and will be out of state then, wanted to be seen sooner.  C/o head congestion and feeling poorly x 2-3 days, dizzy with bending over, no fever, no pain. No drainage she is aware.  Allergy pill otc did not help.  Pt denies chest pain, increased sob or doe, wheezing, orthopnea, PND, palpitations, dizziness or syncope, but has increased LE edema and wt despite the 20 mg lasix daily.  BMET dec 2014 showed cr 1.4, slightly higher than previous, c/w CKD.   Past Medical History  Diagnosis Date  . HYPOTHYROIDISM 04/04/2007  . HYPERTENSION 04/04/2007  . Chronic fatigue   . Depression   . Anemia    Past Surgical History  Procedure Laterality Date  . Appendectomy    . Cholecystectomy    . Hernia repair    . Abdominal hysterectomy    . Spine surgery  2011    cervical  . Oophorectomy    . Back surgery  2012  . Orif tibia fracture  2012    dr duda    reports that she quit smoking about 44 years ago. Her smoking use included Cigarettes. She has a 7 pack-year smoking history. She does not have any smokeless tobacco history on file. She reports that she does not drink alcohol or use illicit drugs. family history includes Cancer (age of onset: 92) in her mother; Heart disease in her father and mother; Lymphoma in her son. Allergies  Allergen Reactions  . Oxycodone Hcl     REACTION: itching   Current Outpatient Prescriptions on File Prior to Visit  Medication Sig Dispense Refill  . amLODipine (NORVASC) 10 MG tablet Take 1 tablet (10 mg total) by mouth daily.  90 tablet  1  . clidinium-chlordiazePOXIDE (LIBRAX) 2.5-5 MG per capsule 2 (two) times daily as needed.       . fluticasone (FLONASE) 50 MCG/ACT nasal spray Place 2 sprays into both nostrils daily.  16 g  5  . levothyroxine (SYNTHROID) 137 MCG tablet Take 1 tablet (137 mcg total)  by mouth daily before breakfast.  90 tablet  1  . Multiple Vitamin (MULTIVITAMIN) tablet Take 1 tablet by mouth daily.      Marland Kitchen olmesartan (BENICAR) 40 MG tablet Take 1 tablet (40 mg total) by mouth daily.  90 tablet  3  . omeprazole (PRILOSEC) 40 MG capsule Take 1 tablet by mouth daily before breakfast.       . potassium chloride SA (K-DUR,KLOR-CON) 20 MEQ tablet Take 1 tablet (20 mEq total) by mouth daily.  90 tablet  1  . triamcinolone cream (KENALOG) 0.1 % Apply topically 2 (two) times daily.  30 g  1  . zolpidem (AMBIEN) 5 MG tablet Take 1 tablet (5 mg total) by mouth at bedtime as needed for sleep.  30 tablet  5   No current facility-administered medications on file prior to visit.   Review of Systems  Constitutional: Negative for unexpected weight change, or unusual diaphoresis  HENT: Negative for tinnitus.   Eyes: Negative for photophobia and visual disturbance.  Respiratory: Negative for choking and stridor.   Gastrointestinal: Negative for vomiting and blood in stool.  Genitourinary: Negative for hematuria and decreased urine volume.  Musculoskeletal: Negative for acute joint swelling Skin: Negative for color change  and wound.  Neurological: Negative for tremors and numbness other than noted  Psychiatric/Behavioral: Negative for decreased concentration or  hyperactivity.       Objective:   Physical Exam BP 142/50  Pulse 71  Temp(Src) 98.7 F (37.1 C) (Oral)  Wt 177 lb 8 oz (80.513 kg)  SpO2 96% VS noted,  Constitutional: Pt appears well-developed and well-nourished.  HENT: Head: NCAT.  Right Ear: External ear normal.  Left Ear: External ear normal.  Eyes: Conjunctivae and EOM are normal. Pupils are equal, round, and reactive to light.  Neck: Normal range of motion. Neck supple.  Cardiovascular: Normal rate and regular rhythm.  , with soft gr 2/6 short syst murmur LUSB Pulmonary/Chest: Effort normal and breath sounds some decreased but no rales or wheezing.  Abd:  Soft,  NT, non-distended, + BS Neurological: Pt is alert. Not confused  Skin: Skin is warm. No erythema. 1-2+ edema to knees, several varicosities noted Psychiatric: Pt behavior is normal. Thought content normal.     Assessment & Plan:

## 2013-11-15 NOTE — Patient Instructions (Signed)
OK to increase the lasix to 40 mg per day (a new prescription was sent to your pharmacy, but also you can take 2 of the 20 mg lasix that you have until used up)  Please go to the LAB in the Basement (turn left off the elevator) for the tests to be done today  You will be contacted by phone if any changes need to be made immediately.  Otherwise, you will receive a letter about your results with an explanation, but please check with MyChart first.  You will be contacted regarding the referral for: echocardiogram  Please return to see me just before April 28 when you are leaving town, and remember to check your weight in the AM every day (and bring the results with you written down to the next visit)

## 2013-11-15 NOTE — Assessment & Plan Note (Signed)
Suspect some venous insufficiency, but could also be related to some worsening renal insuff, or even such as diast dysfxn; for incr lasix to 40 qd, for echo (also to assess heart murmur), and f/u labs today; also to have back here prior to apr 28 when she is leaving town, with her daily wts; to then f/u with Dr Shawna Orleans /PCP long term

## 2013-11-15 NOTE — Assessment & Plan Note (Signed)
Ok to follow for now 

## 2013-11-15 NOTE — Telephone Encounter (Signed)
Noted  

## 2013-11-15 NOTE — Progress Notes (Signed)
Pre visit review using our clinic review tool, if applicable. No additional management support is needed unless otherwise documented below in the visit note. 

## 2013-11-15 NOTE — Assessment & Plan Note (Signed)
For f/u lab today 

## 2013-11-23 ENCOUNTER — Telehealth: Payer: Self-pay | Admitting: *Deleted

## 2013-11-23 NOTE — Telephone Encounter (Signed)
appt cancelled with Dr Jenny Reichmann and scheduled with Dr Shawna Orleans. Lab also scheduled

## 2013-11-23 NOTE — Telephone Encounter (Signed)
Message copied by Pearletha Forge on Thu Nov 23, 2013 10:25 AM ------      Message from: Rosine Abe      Created: Thu Nov 23, 2013  9:30 AM      Regarding: RE: Juluis Rainier       I suggest she continue to take lasix 20 mg once daily.  She can cut 40 mg tab in half.  If she does not want to cut pills, ok to call in lasix 20 mg #90 one po qd RF x 3.              Have her follow up with me within 1 weeks.  BMET - 401.9 before OV.            RY      ----- Message -----         From: Scarlette Calico, CMA         Sent: 11/23/2013   9:16 AM           To: Rosine Abe, DO      Subject: RE: FYI                                                  Pt has a follow up appt with Dr Jenny Reichmann on 12/01/13 and he increased the lasix to 40 mg.  Pt is confused. She feels like Dr Shawna Orleans should call Dr Jenny Reichmann cause she is getting 2 different messages.            ----- Message -----         From: Rosine Abe, DO         Sent: 11/16/2013   9:38 AM           To: Scarlette Calico, CMA      Subject: FW: FYI                                                  Call pt - her kidney function slightly worse.  Reduce lasix dose to 20 mg.  I suggest OV within 1 month.                  ----- Message -----         From: Biagio Borg, MD         Sent: 11/15/2013   4:21 PM           To: Rosine Abe, DO      Subject: FYI                                                      I saw pt as she is leaving town soon and could not see you.  See note for details. thanks                   ------

## 2013-11-28 ENCOUNTER — Other Ambulatory Visit (INDEPENDENT_AMBULATORY_CARE_PROVIDER_SITE_OTHER): Payer: Medicare Other

## 2013-11-28 ENCOUNTER — Other Ambulatory Visit (HOSPITAL_COMMUNITY): Payer: Self-pay | Admitting: Internal Medicine

## 2013-11-28 ENCOUNTER — Ambulatory Visit (HOSPITAL_COMMUNITY): Payer: Medicare Other | Attending: Internal Medicine | Admitting: Cardiology

## 2013-11-28 DIAGNOSIS — I10 Essential (primary) hypertension: Secondary | ICD-10-CM | POA: Diagnosis not present

## 2013-11-28 DIAGNOSIS — R0602 Shortness of breath: Secondary | ICD-10-CM

## 2013-11-28 DIAGNOSIS — R413 Other amnesia: Secondary | ICD-10-CM | POA: Diagnosis not present

## 2013-11-28 DIAGNOSIS — Z79899 Other long term (current) drug therapy: Secondary | ICD-10-CM | POA: Diagnosis not present

## 2013-11-28 DIAGNOSIS — R011 Cardiac murmur, unspecified: Secondary | ICD-10-CM | POA: Diagnosis not present

## 2013-11-28 DIAGNOSIS — R609 Edema, unspecified: Secondary | ICD-10-CM | POA: Diagnosis not present

## 2013-11-28 DIAGNOSIS — R600 Localized edema: Secondary | ICD-10-CM

## 2013-11-28 DIAGNOSIS — N289 Disorder of kidney and ureter, unspecified: Secondary | ICD-10-CM

## 2013-11-28 LAB — BASIC METABOLIC PANEL
BUN: 25 mg/dL — ABNORMAL HIGH (ref 6–23)
CALCIUM: 9.2 mg/dL (ref 8.4–10.5)
CO2: 26 meq/L (ref 19–32)
Chloride: 104 mEq/L (ref 96–112)
Creatinine, Ser: 1.2 mg/dL (ref 0.4–1.2)
GFR: 43.96 mL/min — AB (ref >60.00)
GLUCOSE: 106 mg/dL — AB (ref 70–99)
Potassium: 4.2 mEq/L (ref 3.5–5.1)
SODIUM: 137 meq/L (ref 135–145)

## 2013-11-28 LAB — BRAIN NATRIURETIC PEPTIDE: Pro B Natriuretic peptide (BNP): 49 pg/mL (ref 0.0–100.0)

## 2013-11-28 NOTE — Progress Notes (Signed)
Echo performed. 

## 2013-11-29 ENCOUNTER — Ambulatory Visit (INDEPENDENT_AMBULATORY_CARE_PROVIDER_SITE_OTHER): Payer: Medicare Other | Admitting: Internal Medicine

## 2013-11-29 ENCOUNTER — Encounter: Payer: Self-pay | Admitting: Internal Medicine

## 2013-11-29 VITALS — BP 146/50 | HR 64 | Temp 98.4°F | Ht 61.5 in | Wt 177.0 lb

## 2013-11-29 DIAGNOSIS — I10 Essential (primary) hypertension: Secondary | ICD-10-CM

## 2013-11-29 DIAGNOSIS — N289 Disorder of kidney and ureter, unspecified: Secondary | ICD-10-CM

## 2013-11-29 MED ORDER — FUROSEMIDE 20 MG PO TABS: 20.0000 mg | ORAL_TABLET | Freq: Every day | ORAL | Status: DC

## 2013-11-29 NOTE — Patient Instructions (Addendum)
Follow low sodium diet (limit to 3 grams per day) Please complete the following lab tests before your next follow up appointment: BMET - 401.9

## 2013-11-29 NOTE — Progress Notes (Signed)
Pre visit review using our clinic review tool, if applicable. No additional management support is needed unless otherwise documented below in the visit note. 

## 2013-11-29 NOTE — Progress Notes (Signed)
Subjective:    Patient ID: [name], female    DOB: 1929/06/01, 80 y.o.   MRN: [id number]  HPI  80 year old white female with difficult to control hypertension and chronic lower extremity edema for followup. Interval medical history-patient seen by Dr. Jenny Reichmann. Her furosemide was increased to 40 mg. Patient reports despite higher dose she did not urinate more frequently nor did her lower extremity edema improved.  She reports Dr. Jenny Reichmann heard faint murmur on cardiac exam 2-D echocardiogram was ordered. Her EF was normal between 60 and 65%. There was evidence of grade 1 diastolic dysfunction. No significant valvular abnormalities noted.  She is planning to visit friends in [place] and daughter in [place].  Review of Systems Negative for orthopnea, negative for shortness of breath    Past Medical History  Diagnosis Date  . HYPOTHYROIDISM 04/04/2007  . HYPERTENSION 04/04/2007  . Chronic fatigue   . Depression   . Anemia     History   Social History  . Marital Status: Widowed    Spouse Name: N/A    Number of Children: N/A  . Years of Education: N/A   Occupational History  . Retired    Social History Main Topics  . Smoking status: Former Smoker -- 1.00 packs/day for 7 years    Types: Cigarettes    Quit date: 10/13/1969  . Smokeless tobacco: Not on file  . Alcohol Use: No  . Drug Use: No  . Sexual Activity: Not on file   Other Topics Concern  . Not on file   Social History Narrative  . No narrative on file    Past Surgical History  Procedure Laterality Date  . Appendectomy    . Cholecystectomy    . Hernia repair    . Abdominal hysterectomy    . Spine surgery  2011    cervical  . Oophorectomy    . Back surgery  2012  . Orif tibia fracture  2012    dr duda    Family History  Problem Relation Age of Onset  . Heart disease Mother   . Cancer Mother 87    renal cell CA  . Lymphoma Son   . Heart disease Father     Allergies  Allergen Reactions  .  Oxycodone Hcl     REACTION: itching    Current Outpatient Prescriptions on File Prior to Visit  Medication Sig Dispense Refill  . amLODipine (NORVASC) 10 MG tablet Take 1 tablet (10 mg total) by mouth daily.  90 tablet  1  . clidinium-chlordiazePOXIDE (LIBRAX) 2.5-5 MG per capsule 2 (two) times daily as needed.       . fluticasone (FLONASE) 50 MCG/ACT nasal spray Place 2 sprays into both nostrils daily.  16 g  5  . levothyroxine (SYNTHROID) 137 MCG tablet Take 1 tablet (137 mcg total) by mouth daily before breakfast.  90 tablet  1  . Multiple Vitamin (MULTIVITAMIN) tablet Take 1 tablet by mouth daily.      Marland Kitchen olmesartan (BENICAR) 40 MG tablet Take 1 tablet (40 mg total) by mouth daily.  90 tablet  3  . omeprazole (PRILOSEC) 40 MG capsule Take 1 tablet by mouth daily before breakfast.       . potassium chloride SA (K-DUR,KLOR-CON) 20 MEQ tablet Take 1 tablet (20 mEq total) by mouth daily.  90 tablet  1  . zolpidem (AMBIEN) 5 MG tablet Take 1 tablet (5 mg total) by mouth at bedtime as needed for sleep.  30 tablet  5   No current facility-administered medications on file prior to visit.    BP 146/50  Pulse 64  Temp(Src) 98.4 F (36.9 C) (Oral)  Ht 5' 1.5" (1.562 m)  Wt 177 lb (80.287 kg)  BMI 32.91 kg/m2    Objective:   Physical Exam  Constitutional: She is oriented to person, place, and time. She appears well-developed and well-nourished. No distress.  HENT:  Head: Normocephalic and atraumatic.  Eyes: EOM are normal. Pupils are equal, round, and reactive to light.  Neck: Neck supple.  Cardiovascular: Normal rate, regular rhythm and normal heart sounds.  Exam reveals no gallop and no friction rub.   No murmur heard. Pulmonary/Chest: Effort normal and breath sounds normal. She has no wheezes.  Musculoskeletal:  +1 pitting edema bilaterally  Lymphadenopathy:    She has no cervical adenopathy.  Neurological: She is alert and oriented to person, place, and time. No cranial nerve  deficit.  Skin: Skin is warm and dry.  Psychiatric: She has a normal mood and affect. Her behavior is normal.       Assessment & Plan:

## 2013-11-29 NOTE — Assessment & Plan Note (Deleted)
Stable.  Continue lasix 20 mg.  Patient understands to avoid over-the-counter NSAIDs and dehydration.  Lab Results  Component Value Date   CREATININE 1.2 11/28/2013

## 2013-11-29 NOTE — Assessment & Plan Note (Signed)
Stable.  Continue lasix 20 mg.  Patient understands to avoid over-the-counter NSAIDs and dehydration.  Lab Results  Component Value Date   CREATININE 1.2 11/28/2013    

## 2013-11-29 NOTE — Assessment & Plan Note (Signed)
BP improved.  Continue furosemide at 20 mg.  I suspect her chronic lower extremity edema secondary to venous insufficiency and side effect of amlodipine. Her 2-D echocardiogram was negative for right-sided heart failure. It showed grade 1 diastolic dysfunction.  EF was normal at 60-65% BP: 146/50 mmHg

## 2013-12-01 ENCOUNTER — Ambulatory Visit: Payer: Medicare Other | Admitting: Internal Medicine

## 2013-12-25 ENCOUNTER — Encounter: Payer: Self-pay | Admitting: Internal Medicine

## 2013-12-29 ENCOUNTER — Other Ambulatory Visit: Payer: Self-pay | Admitting: Internal Medicine

## 2014-01-02 ENCOUNTER — Telehealth: Payer: Self-pay | Admitting: Internal Medicine

## 2014-01-02 MED ORDER — OLMESARTAN MEDOXOMIL 40 MG PO TABS: 40.0000 mg | ORAL_TABLET | Freq: Every day | ORAL | Status: DC

## 2014-01-02 NOTE — Telephone Encounter (Addendum)
Pt has requested her refill of olmesartan (BENICAR) 40 MG tablet through mailorder, but is out and needs 10 pills sent to [address] to get her through Pt has no pill to take today.

## 2014-01-02 NOTE — Telephone Encounter (Signed)
10 day supply sent in to [facility] and 90 day supply sent in to [facility]

## 2014-02-23 ENCOUNTER — Other Ambulatory Visit: Payer: Medicare Other

## 2014-03-02 ENCOUNTER — Ambulatory Visit: Payer: Medicare Other | Admitting: Internal Medicine

## 2014-03-05 ENCOUNTER — Other Ambulatory Visit (INDEPENDENT_AMBULATORY_CARE_PROVIDER_SITE_OTHER): Payer: Medicare Other

## 2014-03-05 DIAGNOSIS — I10 Essential (primary) hypertension: Secondary | ICD-10-CM

## 2014-03-05 LAB — BASIC METABOLIC PANEL
BUN: 19 mg/dL (ref 6–23)
CALCIUM: 9.1 mg/dL (ref 8.4–10.5)
CO2: 26 meq/L (ref 19–32)
CREATININE: 1.4 mg/dL — AB (ref 0.4–1.2)
Chloride: 103 mEq/L (ref 96–112)
GFR: 38.19 mL/min — ABNORMAL LOW (ref >60.00)
Glucose, Bld: 92 mg/dL (ref 70–99)
Potassium: 4.8 mEq/L (ref 3.5–5.1)
Sodium: 136 mEq/L (ref 135–145)

## 2014-03-07 ENCOUNTER — Ambulatory Visit (INDEPENDENT_AMBULATORY_CARE_PROVIDER_SITE_OTHER): Payer: Medicare Other | Admitting: Internal Medicine

## 2014-03-07 ENCOUNTER — Encounter: Payer: Self-pay | Admitting: Internal Medicine

## 2014-03-07 VITALS — BP 136/60 | HR 72 | Temp 98.2°F | Ht 61.5 in | Wt 175.0 lb

## 2014-03-07 DIAGNOSIS — I10 Essential (primary) hypertension: Secondary | ICD-10-CM | POA: Diagnosis not present

## 2014-03-07 DIAGNOSIS — R413 Other amnesia: Secondary | ICD-10-CM | POA: Diagnosis not present

## 2014-03-07 DIAGNOSIS — N289 Disorder of kidney and ureter, unspecified: Secondary | ICD-10-CM | POA: Diagnosis not present

## 2014-03-07 MED ORDER — TRAMADOL HCL 50 MG PO TABS: 25.0000 mg | ORAL_TABLET | Freq: Two times a day (BID) | ORAL | Status: DC | PRN

## 2014-03-07 MED ORDER — DONEPEZIL HCL 5 MG PO TABS: 5.0000 mg | ORAL_TABLET | Freq: Every day | ORAL | Status: DC

## 2014-03-07 NOTE — Progress Notes (Signed)
Pre visit review using our clinic review tool, if applicable. No additional management support is needed unless otherwise documented below in the visit note. 

## 2014-03-07 NOTE — Assessment & Plan Note (Signed)
Patient currently taking over-the-counter supplement for memory loss. Patient advised to discontinue supplement. Start Aricept 5 mg once daily.

## 2014-03-07 NOTE — Progress Notes (Signed)
Subjective:    Patient ID: [name], female    DOB: 1929/04/28, 80 y.o.   MRN: [id number]  HPI  80 year old white female with history of hypertension, hypothyroidism and renal insufficiency for followup. Patient's blood pressure has been well-controlled. Patient has been unable to monitor her blood pressure at home but denies any dizziness or lightheadedness. She has mild persistent lower extremity edema.  Patient wonders whether Benicar contributing to her gastrointestinal symptoms. She has intermittent abdominal bloating. She denies any chronic diarrhea.  Mild memory loss-patient currently taking over-the-counter "memory supplement". She has not noticed significant change. Her sister noted to have advanced dementia.  Chronic renal insufficiency-patient was told by nephrologist to avoid taking Advil. Patient reports intermittently using over-the-counter Aleve for musculoskeletal pain.  Review of Systems Negative for chest pain, negative for shortness of breath  Past Medical History  Diagnosis Date  . HYPOTHYROIDISM 04/04/2007  . HYPERTENSION 04/04/2007  . Chronic fatigue   . Depression   . Anemia     History   Social History  . Marital Status: Widowed    Spouse Name: N/A    Number of Children: N/A  . Years of Education: N/A   Occupational History  . Retired    Social History Main Topics  . Smoking status: Former Smoker -- 1.00 packs/day for 7 years    Types: Cigarettes    Quit date: 10/13/1969  . Smokeless tobacco: Not on file  . Alcohol Use: No  . Drug Use: No  . Sexual Activity: Not on file   Other Topics Concern  . Not on file   Social History Narrative  . No narrative on file    Past Surgical History  Procedure Laterality Date  . Appendectomy    . Cholecystectomy    . Hernia repair    . Abdominal hysterectomy    . Spine surgery  2011    cervical  . Oophorectomy    . Back surgery  2012  . Orif tibia fracture  2012    dr duda    Family History   Problem Relation Age of Onset  . Heart disease Mother   . Cancer Mother 61    renal cell CA  . Lymphoma Son   . Heart disease Father     Allergies  Allergen Reactions  . Oxycodone Hcl     REACTION: itching    Current Outpatient Prescriptions on File Prior to Visit  Medication Sig Dispense Refill  . amLODipine (NORVASC) 10 MG tablet TAKE 1 TABLET DAILY  90 tablet  3  . clidinium-chlordiazePOXIDE (LIBRAX) 2.5-5 MG per capsule 2 (two) times daily as needed.       . fluticasone (FLONASE) 50 MCG/ACT nasal spray Place 2 sprays into both nostrils daily.  16 g  5  . furosemide (LASIX) 20 MG tablet Take 1 tablet (20 mg total) by mouth daily.  90 tablet  3  . levothyroxine (SYNTHROID, LEVOTHROID) 137 MCG tablet TAKE 1 TABLET DAILY BEFORE BREAKFAST  90 tablet  3  . Multiple Vitamin (MULTIVITAMIN) tablet Take 1 tablet by mouth daily.      Marland Kitchen olmesartan (BENICAR) 40 MG tablet Take 1 tablet (40 mg total) by mouth daily.  90 tablet  1  . omeprazole (PRILOSEC) 40 MG capsule Take 1 tablet by mouth daily before breakfast.       . potassium chloride SA (K-DUR,KLOR-CON) 20 MEQ tablet Take 1 tablet (20 mEq total) by mouth daily.  90 tablet  1  .  zolpidem (AMBIEN) 5 MG tablet Take 1 tablet (5 mg total) by mouth at bedtime as needed for sleep.  30 tablet  5   No current facility-administered medications on file prior to visit.    BP 136/60  Pulse 72  Temp(Src) 98.2 F (36.8 C) (Oral)  Ht 5' 1.5" (1.562 m)  Wt 175 lb (79.379 kg)  BMI 32.53 kg/m2       Objective:   Physical Exam  Constitutional: She is oriented to person, place, and time. She appears well-developed and well-nourished.  HENT:  Head: Normocephalic and atraumatic.  Cardiovascular: Normal rate, regular rhythm and normal heart sounds.   No murmur heard. Pulmonary/Chest: Effort normal and breath sounds normal. She has no wheezes.  Musculoskeletal: She exhibits no edema.  Neurological: She is alert and oriented to person, place,  and time. No cranial nerve deficit.  Psychiatric: She has a normal mood and affect. Her behavior is normal.          Assessment & Plan:

## 2014-03-07 NOTE — Patient Instructions (Addendum)
Please complete the following lab tests before your next follow up appointment: BMET - 593.9

## 2014-03-07 NOTE — Assessment & Plan Note (Signed)
Stable.  Patient things Benicar may be causing gastrointestinal upset. She declines changing medication at this time (valsartan). BP: 136/60 mmHg

## 2014-03-07 NOTE — Assessment & Plan Note (Signed)
Patient advised discontinue over-the-counter Aleve and all other NSAIDs. Switch to tramadol 25 mg twice daily as needed.

## 2014-03-19 DIAGNOSIS — M1710 Unilateral primary osteoarthritis, unspecified knee: Secondary | ICD-10-CM | POA: Diagnosis not present

## 2014-05-03 ENCOUNTER — Other Ambulatory Visit: Payer: Self-pay | Admitting: Internal Medicine

## 2014-05-07 ENCOUNTER — Telehealth: Payer: Self-pay | Admitting: Internal Medicine

## 2014-05-07 NOTE — Telephone Encounter (Addendum)
Pt states her rx traMADol (ULTRAM) 50 MG tablet states on the rx not to take near bedtime. Pt states that is when she needs med. Pt  Has not got this filled and is going out of town Friday am and needs another rx she can take at bedtime. [facility] [address]

## 2014-05-07 NOTE — Telephone Encounter (Signed)
[id number] [place], [place] is requesting re-fill on zolpidem (AMBIEN) 5 MG tablet

## 2014-05-08 NOTE — Telephone Encounter (Signed)
The advice not take near bedtime is because she is also taking ambien at bedtime.  I am concerned about potential drug interaction that would increase her risk for fall if she needed to get up in the middle of the night.  The same would apply to other narcotic type pain medication.  If she wants to take tramadol and bedtime, I suggest she either hold or take 1/2 dose ambien or 2.5 mg   Ok to RF tramadol x 1

## 2014-05-08 NOTE — Telephone Encounter (Signed)
Ok to RF x 3 

## 2014-05-09 MED ORDER — TRAMADOL HCL 50 MG PO TABS: 25.0000 mg | ORAL_TABLET | Freq: Two times a day (BID) | ORAL | Status: DC | PRN

## 2014-05-09 MED ORDER — ZOLPIDEM TARTRATE 5 MG PO TABS: 5.0000 mg | ORAL_TABLET | Freq: Every evening | ORAL | Status: DC | PRN

## 2014-05-09 NOTE — Telephone Encounter (Signed)
Left detailed message Rx's called into pharmacy.

## 2014-05-09 NOTE — Telephone Encounter (Signed)
Pt following up on med request. Pt going out of town ,can you call in today? Thanks [facility] [address]

## 2014-05-24 ENCOUNTER — Encounter: Payer: Self-pay | Admitting: Internal Medicine

## 2014-06-05 ENCOUNTER — Ambulatory Visit: Payer: Medicare Other | Admitting: Internal Medicine

## 2014-06-06 ENCOUNTER — Ambulatory Visit (INDEPENDENT_AMBULATORY_CARE_PROVIDER_SITE_OTHER): Payer: Medicare Other | Admitting: Internal Medicine

## 2014-06-06 ENCOUNTER — Encounter: Payer: Self-pay | Admitting: Internal Medicine

## 2014-06-06 VITALS — BP 144/62 | HR 68 | Temp 98.5°F | Ht 62.5 in | Wt 177.0 lb

## 2014-06-06 DIAGNOSIS — R413 Other amnesia: Secondary | ICD-10-CM | POA: Diagnosis not present

## 2014-06-06 DIAGNOSIS — N289 Disorder of kidney and ureter, unspecified: Secondary | ICD-10-CM

## 2014-06-06 DIAGNOSIS — M542 Cervicalgia: Secondary | ICD-10-CM

## 2014-06-06 DIAGNOSIS — G8929 Other chronic pain: Secondary | ICD-10-CM

## 2014-06-06 DIAGNOSIS — J069 Acute upper respiratory infection, unspecified: Secondary | ICD-10-CM

## 2014-06-06 DIAGNOSIS — I10 Essential (primary) hypertension: Secondary | ICD-10-CM | POA: Diagnosis not present

## 2014-06-06 DIAGNOSIS — R05 Cough: Secondary | ICD-10-CM

## 2014-06-06 DIAGNOSIS — R059 Cough, unspecified: Secondary | ICD-10-CM

## 2014-06-06 MED ORDER — HYDROCODONE-HOMATROPINE 5-1.5 MG/5ML PO SYRP: 5.0000 mL | ORAL_SOLUTION | Freq: Two times a day (BID) | ORAL | Status: DC | PRN

## 2014-06-06 NOTE — Assessment & Plan Note (Addendum)
Patient complains of chronic left-sided neck pain. I suspect her symptoms are secondary to cervical spondylosis. She also has tenderness near her left upper clavicle and left parasternal area. Obtain x-ray of cervical spine and chest x-ray.

## 2014-06-06 NOTE — Assessment & Plan Note (Signed)
Patient discontinued aricept.  Observe for now.

## 2014-06-06 NOTE — Progress Notes (Signed)
Subjective:    Patient ID: [name], female    DOB: [birth year], 80 y.o.   MRN: [id number]  HPI  80 year old white female with history of hypertension, mild memory loss and osteoarthritis for follow up.  Patient complains of catching a cold over the last 3-4 days. She complains of nasal congestion and "hollow" sensation in her left ear. She denies any ear pain. She complains of nonproductive cough. She denies any shortness of breath.  Patient also complains of chronic left-sided neck pain and left parasternal pain. Patient reports her neck pain has been chronic for years. She denies any weakness in her left arm. Pain sometimes also affects area above left clavicle.  Hypertension-stable  Chronic renal insufficiency - patient avoiding NSAIDs.  She reports minimal improvement with using over-the-counter Tylenol.   Lab Results  Component Value Date   CREATININE 1.4* 03/05/2014     Review of Systems Negative for fever, negative for shortness of breath    Past Medical History  Diagnosis Date  . HYPOTHYROIDISM 04/04/2007  . HYPERTENSION 04/04/2007  . Chronic fatigue   . Depression   . Anemia     History   Social History  . Marital Status: Widowed    Spouse Name: N/A    Number of Children: N/A  . Years of Education: N/A   Occupational History  . Retired    Social History Main Topics  . Smoking status: Former Smoker -- 1.00 packs/day for 7 years    Types: Cigarettes    Quit date: 10/13/1969  . Smokeless tobacco: Not on file  . Alcohol Use: No  . Drug Use: No  . Sexual Activity: Not on file   Other Topics Concern  . Not on file   Social History Narrative  . No narrative on file    Past Surgical History  Procedure Laterality Date  . Appendectomy    . Cholecystectomy    . Hernia repair    . Abdominal hysterectomy    . Spine surgery  2011    cervical  . Oophorectomy    . Back surgery  2012  . Orif tibia fracture  2012    dr duda    Family History    Problem Relation Age of Onset  . Heart disease Mother   . Cancer Mother 80    renal cell CA  . Lymphoma Son   . Heart disease Father     Allergies  Allergen Reactions  . Oxycodone Hcl     REACTION: itching    Current Outpatient Prescriptions on File Prior to Visit  Medication Sig Dispense Refill  . amLODipine (NORVASC) 10 MG tablet TAKE 1 TABLET DAILY  90 tablet  3  . cetirizine (ZYRTEC) 10 MG tablet Take 10 mg by mouth daily.      . clidinium-chlordiazePOXIDE (LIBRAX) 2.5-5 MG per capsule 2 (two) times daily as needed.       . fluticasone (FLONASE) 50 MCG/ACT nasal spray Place 2 sprays into both nostrils daily.  16 g  5  . furosemide (LASIX) 20 MG tablet Take 1 tablet (20 mg total) by mouth daily.  90 tablet  3  . levothyroxine (SYNTHROID, LEVOTHROID) 137 MCG tablet TAKE 1 TABLET DAILY BEFORE BREAKFAST  90 tablet  3  . Multiple Vitamin (MULTIVITAMIN) tablet Take 1 tablet by mouth daily.      Marland Kitchen olmesartan (BENICAR) 40 MG tablet Take 1 tablet (40 mg total) by mouth daily.  90 tablet  1  .  omeprazole (PRILOSEC) 40 MG capsule Take 1 tablet by mouth daily before breakfast.       . potassium chloride SA (K-DUR,KLOR-CON) 20 MEQ tablet TAKE 1 TABLET DAILY  90 tablet  0  . traMADol (ULTRAM) 50 MG tablet Take 0.5 tablets (25 mg total) by mouth every 12 (twelve) hours as needed (Do not take near bedtime).  30 tablet  1  . zolpidem (AMBIEN) 5 MG tablet Take 1 tablet (5 mg total) by mouth at bedtime as needed for sleep.  30 tablet  3   No current facility-administered medications on file prior to visit.    BP 144/62  Pulse 68  Temp(Src) 98.5 F (36.9 C) (Oral)  Ht 5' 2.5" (1.588 m)  Wt 177 lb (80.287 kg)  BMI 31.84 kg/m2    Objective:   Physical Exam  Constitutional: She is oriented to person, place, and time. She appears well-developed and well-nourished.  Neck:  Decreased range of motion of cervical spine  Cardiovascular: Normal rate, regular rhythm and normal heart sounds.    No murmur heard. Pulmonary/Chest: Effort normal and breath sounds normal. She has no wheezes.  Musculoskeletal: She exhibits no edema.  Upper extremity strength is 5 out of 5 throughout  Neurological: She is alert and oriented to person, place, and time. She displays normal reflexes. No cranial nerve deficit. She exhibits normal muscle tone. Coordination normal.  Skin: Skin is warm and dry.  Psychiatric: She has a normal mood and affect. Her behavior is normal.          Assessment & Plan:

## 2014-06-06 NOTE — Assessment & Plan Note (Signed)
Patient has signs and symptoms of viral upper respiratory infection. I recommend symptomatically treatment. Patient advised to call office if symptoms persist or worsen.

## 2014-06-06 NOTE — Patient Instructions (Addendum)
Gargle with warm salt water twice daily for 3-5 days Use intranasal saline 2 sprays 3-4 times daily Use mucinex twice daily over the counter Please contact our office if your symptoms do not improve or gets worse. Please complete the following lab tests before your next follow up appointment: BMET - 401.9 TSH - 244.9 CBCD - 285.9

## 2014-06-06 NOTE — Assessment & Plan Note (Signed)
Stable.  Continue to use tramadol for musculoskeletal pain

## 2014-06-06 NOTE — Progress Notes (Signed)
Pre visit review using our clinic review tool, if applicable. No additional management support is needed unless otherwise documented below in the visit note. 

## 2014-06-06 NOTE — Assessment & Plan Note (Addendum)
Stable. No change in medication. BP: 144/62 mmHg

## 2014-06-12 ENCOUNTER — Ambulatory Visit (HOSPITAL_COMMUNITY): Payer: Medicare Other | Attending: Emergency Medicine

## 2014-06-12 ENCOUNTER — Emergency Department (INDEPENDENT_AMBULATORY_CARE_PROVIDER_SITE_OTHER)
Admission: EM | Admit: 2014-06-12 | Discharge: 2014-06-12 | Disposition: A | Discharge status: Home / Self Care | Payer: Medicare Other | Source: Home / Self Care | Attending: Emergency Medicine | Admitting: Emergency Medicine

## 2014-06-12 ENCOUNTER — Encounter (HOSPITAL_COMMUNITY): Payer: Self-pay | Admitting: Emergency Medicine

## 2014-06-12 ENCOUNTER — Telehealth: Payer: Self-pay | Admitting: Internal Medicine

## 2014-06-12 DIAGNOSIS — R509 Fever, unspecified: Secondary | ICD-10-CM | POA: Insufficient documentation

## 2014-06-12 DIAGNOSIS — N183 Chronic kidney disease, stage 3 unspecified: Secondary | ICD-10-CM

## 2014-06-12 DIAGNOSIS — R531 Weakness: Secondary | ICD-10-CM | POA: Insufficient documentation

## 2014-06-12 DIAGNOSIS — J019 Acute sinusitis, unspecified: Secondary | ICD-10-CM | POA: Diagnosis not present

## 2014-06-12 DIAGNOSIS — B998 Other infectious disease: Secondary | ICD-10-CM | POA: Diagnosis not present

## 2014-06-12 DIAGNOSIS — R059 Cough, unspecified: Secondary | ICD-10-CM

## 2014-06-12 DIAGNOSIS — H6502 Acute serous otitis media, left ear: Secondary | ICD-10-CM

## 2014-06-12 DIAGNOSIS — R05 Cough: Secondary | ICD-10-CM

## 2014-06-12 DIAGNOSIS — R911 Solitary pulmonary nodule: Secondary | ICD-10-CM | POA: Diagnosis not present

## 2014-06-12 LAB — POCT I-STAT, CHEM 8
BUN: 34 mg/dL — AB (ref 6–23)
CALCIUM ION: 1.31 mmol/L — AB (ref 1.13–1.30)
CHLORIDE: 108 meq/L (ref 96–112)
Creatinine, Ser: 1.8 mg/dL — ABNORMAL HIGH (ref 0.50–1.10)
GLUCOSE: 104 mg/dL — AB (ref 70–99)
HCT: 33 % — ABNORMAL LOW (ref 36.0–46.0)
Hemoglobin: 11.2 g/dL — ABNORMAL LOW (ref 12.0–15.0)
Potassium: 5.1 mEq/L (ref 3.7–5.3)
Sodium: 137 mEq/L (ref 137–147)
TCO2: 22 mmol/L (ref 0–100)

## 2014-06-12 MED ORDER — CEFDINIR 300 MG PO CAPS: 300.0000 mg | ORAL_CAPSULE | Freq: Two times a day (BID) | ORAL | Status: DC

## 2014-06-12 NOTE — ED Provider Notes (Signed)
Chief Complaint   URI   History of Present Illness   [name] is an 80 year old female who has had a 10 day history of a cough productive of yellow sputum with occasional small flecks of blood, wheezing, left ear congestion and diminished hearing, dizziness, nausea, anorexia, and she feels unsteady on her feet. She denies any ear pain. She's had no chest pain. This morning she felt hot and checked her temperature. It was 100.8. She has nasal congestion with a small amount of drainage, and some loose stools. She has seen her primary care physician last week and he prescribed some hydrocodone cough syrup. Her biggest complaint right now is the ear discomfort.  Review of Systems   Other than as noted above, the patient denies any of the following symptoms: Systemic:  No fevers, chills, sweats, or myalgias. Eye:  No redness or discharge. ENT:  No ear pain, headache, nasal congestion, drainage, sinus pressure, or sore throat. Neck:  No neck pain, stiffness, or swollen glands. Lungs:  No cough, sputum production, hemoptysis, wheezing, chest tightness, shortness of breath or chest pain. GI:  No abdominal pain, nausea, vomiting or diarrhea.  [place]   Past medical history, family history, social history, meds, and allergies were reviewed. She is intolerant to oxycodone. Current meds include amlodipine, Librax, Flonase, furosemide, Hycodan cough syrup, Synthroid, Benicar, Prilosec, potassium chloride, tramadol, and zolpidem.medical history includes hypothyroidism, hypertension, chronic fatigue, depression, and anemia. She's also been noted to have stage III chronic kidney disease.  Physical exam   Vital signs:  BP 147/54 mmHg  Pulse 77  Temp(Src) 100.1 F (37.8 C) (Oral)  Resp 16  SpO2 98% General:  Alert and oriented.  In no distress.  Skin warm and dry. Eye:  No conjunctival injection or drainage. Lids were normal. ENT:  Her left TM was retracted, but not erythematous. There was an  air-fluid level present with bubbles. Right TM and canal were normal.  Nasal mucosa was clear and uncongested, without drainage.  Mucous membranes were moist.  Pharynx was clear with no exudate or drainage.  There were no oral ulcerations or lesions. Neck:  Supple, no adenopathy, tenderness or mass. Lungs:  No respiratory distress.  Lungs were clear to auscultation, without wheezes, rales or rhonchi.  Breath sounds were clear and equal bilaterally.  Heart:  Regular rhythm, without gallops, murmers or rubs. Skin:  Clear, warm, and dry, without rash or lesions.  Labs   Results for orders placed or performed during the hospital encounter of 06/12/14  I-STAT, chem 8  Result Value Ref Range   Sodium 137 137 - 147 mEq/L   Potassium 5.1 3.7 - 5.3 mEq/L   Chloride 108 96 - 112 mEq/L   BUN 34 (H) 6 - 23 mg/dL   Creatinine, Ser 1.80 (H) 0.50 - 1.10 mg/dL   Glucose, Bld 104 (H) 70 - 99 mg/dL   Calcium, Ion 1.31 (H) 1.13 - 1.30 mmol/L   TCO2 22 0 - 100 mmol/L   Hemoglobin 11.2 (L) 12.0 - 15.0 g/dL   HCT 33.0 (L) 36.0 - 46.0 %     Radiology   Dg Chest 2 View  06/12/2014   CLINICAL DATA:  Productive cough fever in weakness with onset today  EXAM: CHEST  2 VIEW  COMPARISON:  PA and lateral chest x-ray of September 07, 2011.  FINDINGS: The lungs are adequately inflated. There is no focal infiltrate. There is a stable calcified 6 mm diameter right upper lobe nodule. The  heart and pulmonary vascularity are normal. There is no pleural effusion or pneumothorax. The bony thorax is unremarkable  IMPRESSION: There is no active cardiopulmonary disease. There is evidence of previous granulomatous infection.   Electronically Signed   By: Alese Furniss  [place]   On: 06/12/2014 16:48   Assessment     The primary encounter diagnosis was Acute sinusitis, recurrence not specified, unspecified location. Diagnoses of Cough, Acute serous otitis media of left ear, recurrence not specified, and Chronic kidney disease, stage 3  (moderate) were also pertinent to this visit.  There is no evidence of pneumonia or strep throat. There is no evidence of suppurative otitis media, but she does have serous otitis media. Also she has had a slight increase in her creatinine from 1.4-1.8 over the past 3 months. I have asked her to follow-up with her primary care physician about this within the next 2 weeks, and to try to get extra fluids. She is to avoid nonsteroidal anti-inflammatory drugs.  Plan    1.  Meds:  The following meds were prescribed:   Discharge Medication List as of 06/12/2014  5:06 PM    START taking these medications   Details  cefdinir (OMNICEF) 300 MG capsule Take 1 capsule (300 mg total) by mouth 2 (two) times daily., Starting 06/12/2014, Until Discontinued, Normal        2.  Patient Education/Counseling:  The patient was given appropriate handouts, self care instructions, and instructed in symptomatic relief.  Instructed to get extra fluids and extra rest.    3.  Follow up:  The patient was told to follow up here if no better in 3 to 4 days, or sooner if becoming worse in any way, and given some red flag symptoms such as increasing fever, difficulty breathing, chest pain, or persistent vomiting which would prompt immediate return.       Harden Mo, MD 06/12/14 [phone]

## 2014-06-12 NOTE — ED Notes (Signed)
Patient remains at [facility] radiology department

## 2014-06-12 NOTE — ED Notes (Signed)
Patient reports uri since 10/24.  Saw her pcp-dr yoo last Wednesday-10/28.  Reports head and left ear is not clearing up.  Continues with a cough, minimal sputum. Patient repeatedly refers to head and left ear "wont clear up".  Left ear "feels funny

## 2014-06-12 NOTE — Telephone Encounter (Signed)
Patient Information:  Caller Name: Mistie  Phone: [phone]  Patient: [name], [name]  Gender: Female  DOB: 05/27/1929  Age: 80 Years  PCP: Shawna Orleans [facility] [place]) (Adults only)  Office Follow Up:  Does the office need to follow up with this patient?: No  Instructions For The Office: N/A  RN Note:  Unable to scheduled an appointment.  Called office, s/w Estill Bamberg, and she advised to send pt to ED.  Pt refused ED and will go to [facility] asap (d/t transportation needs).  Symptoms  Reason For Call & Symptoms: Onset 06/03/2014 of non-productive cough, low-grade fever, "felt bad", and left ear "felt strange" with decreased hearing.  Cough, T 100.8, chills and dizziness. Seen in office on 06/06/2014 for same.  Reviewed Health History In EMR: Yes  Reviewed Medications In EMR: Yes  Reviewed Allergies In EMR: Yes  Reviewed Surgeries / Procedures: Yes  Date of Onset of Symptoms: 06/03/2014  Treatments Tried: Corcidin HBP 06/07/2014  Treatments Tried Worked: No  Any Fever: Yes  Fever Taken: Oral  Fever Time Of Reading: 10:45:00  Fever Last Reading: 100.8  Guideline(s) Used:  Colds  Disposition Per Guideline:   Go to Office Now  Reason For Disposition Reached:   Fever > 100.5 F (38.1 C) and over 36 years of age  Advice Given:  N/A  Patient Refused Recommendation:  Patient Will Go To [facility]  Unable to scheduled an appointment.  Called office, s/w Estill Bamberg, and she advised to send pt to ED.  Pt refused ED and will go to [facility] asap (d/t transportation needs).

## 2014-06-12 NOTE — ED Notes (Signed)
Patient returned from radiology

## 2014-06-12 NOTE — Discharge Instructions (Signed)

## 2014-06-13 ENCOUNTER — Telehealth: Payer: Self-pay | Admitting: Internal Medicine

## 2014-06-14 NOTE — Telephone Encounter (Signed)
error 

## 2014-06-18 ENCOUNTER — Other Ambulatory Visit (INDEPENDENT_AMBULATORY_CARE_PROVIDER_SITE_OTHER): Payer: Medicare Other

## 2014-06-18 DIAGNOSIS — D638 Anemia in other chronic diseases classified elsewhere: Secondary | ICD-10-CM

## 2014-06-18 DIAGNOSIS — O10019 Pre-existing essential hypertension complicating pregnancy, unspecified trimester: Secondary | ICD-10-CM

## 2014-06-18 DIAGNOSIS — E038 Other specified hypothyroidism: Secondary | ICD-10-CM

## 2014-06-18 LAB — CBC WITH DIFFERENTIAL/PLATELET
Basophils Absolute: 0.1 10*3/uL (ref 0.0–0.1)
Basophils Relative: 0.9 % (ref 0.0–3.0)
EOS ABS: 0.1 10*3/uL (ref 0.0–0.7)
Eosinophils Relative: 1.9 % (ref 0.0–5.0)
HCT: 32.9 % — ABNORMAL LOW (ref 36.0–46.0)
Hemoglobin: 10.9 g/dL — ABNORMAL LOW (ref 12.0–15.0)
LYMPHS PCT: 21.7 % (ref 12.0–46.0)
Lymphs Abs: 1.6 10*3/uL (ref 0.7–4.0)
MCHC: 33 g/dL (ref 30.0–36.0)
MCV: 92.8 fl (ref 78.0–100.0)
Monocytes Absolute: 0.7 10*3/uL (ref 0.1–1.0)
Monocytes Relative: 9.7 % (ref 3.0–12.0)
NEUTROS ABS: 5 10*3/uL (ref 1.4–7.7)
NEUTROS PCT: 65.8 % (ref 43.0–77.0)
Platelets: 338 10*3/uL (ref 150.0–400.0)
RBC: 3.54 Mil/uL — ABNORMAL LOW (ref 3.87–5.11)
RDW: 13 % (ref 11.5–15.5)
WBC: 7.6 10*3/uL (ref 4.0–10.5)

## 2014-06-18 LAB — BASIC METABOLIC PANEL
BUN: 27 mg/dL — ABNORMAL HIGH (ref 6–23)
CALCIUM: 9.1 mg/dL (ref 8.4–10.5)
CO2: 18 meq/L — AB (ref 19–32)
CREATININE: 1.5 mg/dL — AB (ref 0.4–1.2)
Chloride: 100 mEq/L (ref 96–112)
GFR: 34.19 mL/min — ABNORMAL LOW (ref >60.00)
GLUCOSE: 96 mg/dL (ref 70–99)
Potassium: 4.5 mEq/L (ref 3.5–5.1)
Sodium: 130 mEq/L — ABNORMAL LOW (ref 135–145)

## 2014-06-18 LAB — TSH: TSH: 0.92 u[IU]/mL (ref 0.35–4.50)

## 2014-06-21 ENCOUNTER — Encounter: Payer: Self-pay | Admitting: Internal Medicine

## 2014-06-21 ENCOUNTER — Ambulatory Visit (INDEPENDENT_AMBULATORY_CARE_PROVIDER_SITE_OTHER): Payer: Medicare Other | Admitting: Internal Medicine

## 2014-06-21 VITALS — BP 142/54 | HR 68 | Temp 98.6°F | Ht 62.5 in | Wt 178.0 lb

## 2014-06-21 DIAGNOSIS — J069 Acute upper respiratory infection, unspecified: Secondary | ICD-10-CM | POA: Diagnosis not present

## 2014-06-21 DIAGNOSIS — D638 Anemia in other chronic diseases classified elsewhere: Secondary | ICD-10-CM | POA: Diagnosis not present

## 2014-06-21 DIAGNOSIS — N289 Disorder of kidney and ureter, unspecified: Secondary | ICD-10-CM

## 2014-06-21 DIAGNOSIS — E871 Hypo-osmolality and hyponatremia: Secondary | ICD-10-CM

## 2014-06-21 DIAGNOSIS — N179 Acute kidney failure, unspecified: Secondary | ICD-10-CM | POA: Diagnosis not present

## 2014-06-21 DIAGNOSIS — I10 Essential (primary) hypertension: Secondary | ICD-10-CM | POA: Diagnosis not present

## 2014-06-21 NOTE — Assessment & Plan Note (Signed)
80 year old female recently seen by urgent care for sinus congestion and left ear pain. Patient treated with Omnicef 300 mg twice daily for 10 days. Patient reports her symptoms improved.

## 2014-06-21 NOTE — Patient Instructions (Addendum)
Please complete the following lab tests before your next follow up appointment: BMET - use renal insufficiency code  Take 1/2 dose of furosemide 10 mg Take 1/2 dose of amlodipine  5 mg

## 2014-06-21 NOTE — Progress Notes (Signed)
Subjective:    Patient ID: [name], female    DOB: Aug 12, 1928, 80 y.o.   MRN: [id number]  HPI  80 year old white female with history of hypertension, hypothyroidism and renal insufficiency for urgent care follow-up. Patient was seen 9-10 days ago for symptoms of possible sinusitis. She complained of left ear pain. She was treated with Omnicef x 10 days. Urgent care workup included basic metabolic panel. Patient's creatinine rose from 1.5-1.8. Patient reports she was not drinking much fluids.  Her CXR was negative for pneumonia.  Her most recent basic metabolic panel shows serum creatinine returned to 1.5. She is taking furosemide 20 mg once daily. She denies taking any hydrochlorothiazide. Her sodium decreased to 130.  Patient serum creatinine was 1.1 eleven months ago.  Over the past 6-8 months patient same creatinine rose to 1.4-1.5. Renal artery Doppler was obtained due to poorly controlled hypertension despite multiple medications. The study was negative for renal artery stenosis. Patient previously was using NSAIDs (Relafen).  Patient denies any recent NSAID use.  Review of Systems Patient denies fever or chills, mild clicking in left ear    Past Medical History  Diagnosis Date  . HYPOTHYROIDISM 04/04/2007  . HYPERTENSION 04/04/2007  . Chronic fatigue   . Depression   . Anemia     History   Social History  . Marital Status: Widowed    Spouse Name: N/A    Number of Children: N/A  . Years of Education: N/A   Occupational History  . Retired    Social History Main Topics  . Smoking status: Former Smoker -- 1.00 packs/day for 7 years    Types: Cigarettes    Quit date: 10/13/1969  . Smokeless tobacco: Not on file  . Alcohol Use: No  . Drug Use: No  . Sexual Activity: Not on file   Other Topics Concern  . Not on file   Social History Narrative    Past Surgical History  Procedure Laterality Date  . Appendectomy    . Cholecystectomy    . Hernia repair    .  Abdominal hysterectomy    . Spine surgery  2011    cervical  . Oophorectomy    . Back surgery  2012  . Orif tibia fracture  2012    dr duda    Family History  Problem Relation Age of Onset  . Heart disease Mother   . Cancer Mother 10    renal cell CA  . Lymphoma Son   . Heart disease Father     Allergies  Allergen Reactions  . Oxycodone Hcl     REACTION: itching    Current Outpatient Prescriptions on File Prior to Visit  Medication Sig Dispense Refill  . cefdinir (OMNICEF) 300 MG capsule Take 1 capsule (300 mg total) by mouth 2 (two) times daily. 20 capsule 0  . cetirizine (ZYRTEC) 10 MG tablet Take 10 mg by mouth daily.    . Chlorphen-Pseudoephed-APAP (CORICIDIN D PO) Take by mouth.    . clidinium-chlordiazePOXIDE (LIBRAX) 2.5-5 MG per capsule 2 (two) times daily as needed.     . fluticasone (FLONASE) 50 MCG/ACT nasal spray Place 2 sprays into both nostrils daily. 16 g 5  . HYDROcodone-homatropine (HYCODAN) 5-1.5 MG/5ML syrup Take 5 mLs by mouth every 12 (twelve) hours as needed for cough. 120 mL 0  . levothyroxine (SYNTHROID, LEVOTHROID) 137 MCG tablet TAKE 1 TABLET DAILY BEFORE BREAKFAST 90 tablet 3  . Multiple Vitamin (MULTIVITAMIN) tablet Take 1 tablet  by mouth daily.    Marland Kitchen olmesartan (BENICAR) 40 MG tablet Take 1 tablet (40 mg total) by mouth daily. 90 tablet 1  . omeprazole (PRILOSEC) 40 MG capsule Take 1 tablet by mouth daily before breakfast.     . potassium chloride SA (K-DUR,KLOR-CON) 20 MEQ tablet TAKE 1 TABLET DAILY 90 tablet 0  . sodium chloride (OCEAN) 0.65 % SOLN nasal spray Place 1 spray into both nostrils as needed for congestion.    . traMADol (ULTRAM) 50 MG tablet Take 0.5 tablets (25 mg total) by mouth every 12 (twelve) hours as needed (Do not take near bedtime). 30 tablet 1  . zolpidem (AMBIEN) 5 MG tablet Take 1 tablet (5 mg total) by mouth at bedtime as needed for sleep. 30 tablet 3   No current facility-administered medications on file prior to visit.      BP 142/54 mmHg  Pulse 68  Temp(Src) 98.6 F (37 C) (Oral)  Ht 5' 2.5" (1.588 m)  Wt 178 lb (80.74 kg)  BMI 32.02 kg/m2    Objective:   Physical Exam  Constitutional: She is oriented to person, place, and time. She appears well-developed and well-nourished.  HENT:  Head: Normocephalic and atraumatic.  Right Ear: External ear normal.  Left Ear: External ear normal.  Cardiovascular: Normal rate, regular rhythm and normal heart sounds.   No murmur heard. Pulmonary/Chest: Effort normal and breath sounds normal. She has no wheezes.  Neurological: She is alert and oriented to person, place, and time. No cranial nerve deficit.  Skin: Skin is warm and dry.  Psychiatric: She has a normal mood and affect. Her behavior is normal.          Assessment & Plan:

## 2014-06-21 NOTE — Assessment & Plan Note (Signed)
Patient's renal insufficiency likely secondary to long-standing hypertension and previous NSAID use. Her recent increase in serum creatinine likely secondary to dehydration. Decrease furosemide 10 mg. Patient encouraged to increase fluid intake.  Obtain renal ultrasound. Repeat basic metabolic panel in 2 weeks.

## 2014-06-21 NOTE — Assessment & Plan Note (Signed)
Patient experienced transient rise in serum creatinine.  I suspect this is secondary to combination of dehydration and possible transient hypotension.  Decrease furosemide to 10 mg once daily. Decrease amlodipine to 5 mg. Reassess in 2 weeks.  BP: (!) 142/54 mmHg

## 2014-06-21 NOTE — Progress Notes (Signed)
Pre visit review using our clinic review tool, if applicable. No additional management support is needed unless otherwise documented below in the visit note. 

## 2014-06-21 NOTE — Assessment & Plan Note (Signed)
I suspect patient's hyponatremia is from volume depletion.  Increase fluid intake.  Decrease furosemide dose.  Repeat BMET in 2 weeks.

## 2014-06-29 ENCOUNTER — Ambulatory Visit
Admission: RE | Admit: 2014-06-29 | Discharge: 2014-06-29 | Disposition: A | Discharge status: Home / Self Care | Payer: Medicare Other | Source: Ambulatory Visit | Attending: Internal Medicine | Admitting: Internal Medicine

## 2014-06-29 DIAGNOSIS — D638 Anemia in other chronic diseases classified elsewhere: Secondary | ICD-10-CM

## 2014-06-29 DIAGNOSIS — N183 Chronic kidney disease, stage 3 (moderate): Secondary | ICD-10-CM | POA: Diagnosis not present

## 2014-06-29 DIAGNOSIS — N179 Acute kidney failure, unspecified: Secondary | ICD-10-CM

## 2014-06-29 DIAGNOSIS — N289 Disorder of kidney and ureter, unspecified: Secondary | ICD-10-CM

## 2014-07-03 ENCOUNTER — Other Ambulatory Visit (INDEPENDENT_AMBULATORY_CARE_PROVIDER_SITE_OTHER): Payer: Medicare Other

## 2014-07-03 DIAGNOSIS — N289 Disorder of kidney and ureter, unspecified: Secondary | ICD-10-CM

## 2014-07-03 LAB — BASIC METABOLIC PANEL
BUN: 27 mg/dL — ABNORMAL HIGH (ref 6–23)
CALCIUM: 9.1 mg/dL (ref 8.4–10.5)
CO2: 22 mEq/L (ref 19–32)
Chloride: 104 mEq/L (ref 96–112)
Creatinine, Ser: 1.7 mg/dL — ABNORMAL HIGH (ref 0.4–1.2)
GFR: 30.71 mL/min — ABNORMAL LOW (ref >60.00)
Glucose, Bld: 96 mg/dL (ref 70–99)
POTASSIUM: 4.7 meq/L (ref 3.5–5.1)
SODIUM: 136 meq/L (ref 135–145)

## 2014-07-04 ENCOUNTER — Other Ambulatory Visit: Payer: Self-pay | Admitting: Internal Medicine

## 2014-07-09 ENCOUNTER — Telehealth: Payer: Self-pay | Admitting: *Deleted

## 2014-07-09 ENCOUNTER — Ambulatory Visit: Payer: Medicare Other | Admitting: Internal Medicine

## 2014-07-09 DIAGNOSIS — N289 Disorder of kidney and ureter, unspecified: Secondary | ICD-10-CM

## 2014-07-09 NOTE — Telephone Encounter (Signed)
-----   Message from Vaughan Browner sent at 07/09/2014  9:25 AM EST ----- Regarding: Lab Orders Needed Pt states Dr. Shawna Orleans called her on Friday and advised her to cancel her appt for today and r/s for 2 weeks out and have labs done a few days prior.  I have scheduled pt for labs and follow up visit, However, there are not lab orders entered in system.  Would you please assist with entering the lab orders?  Thanks, Derinda Late

## 2014-07-09 NOTE — Telephone Encounter (Signed)
Future order placed 

## 2014-07-23 ENCOUNTER — Other Ambulatory Visit (INDEPENDENT_AMBULATORY_CARE_PROVIDER_SITE_OTHER): Payer: Medicare Other

## 2014-07-23 DIAGNOSIS — N289 Disorder of kidney and ureter, unspecified: Secondary | ICD-10-CM

## 2014-07-23 LAB — BASIC METABOLIC PANEL
BUN: 17 mg/dL (ref 6–23)
CO2: 23 meq/L (ref 19–32)
Calcium: 9 mg/dL (ref 8.4–10.5)
Chloride: 104 mEq/L (ref 96–112)
Creatinine, Ser: 1.2 mg/dL (ref 0.4–1.2)
GFR: 44.3 mL/min — ABNORMAL LOW (ref >60.00)
Glucose, Bld: 88 mg/dL (ref 70–99)
Potassium: 4.3 mEq/L (ref 3.5–5.1)
SODIUM: 134 meq/L — AB (ref 135–145)

## 2014-07-25 ENCOUNTER — Encounter: Payer: Self-pay | Admitting: Internal Medicine

## 2014-07-25 ENCOUNTER — Ambulatory Visit (INDEPENDENT_AMBULATORY_CARE_PROVIDER_SITE_OTHER): Payer: Medicare Other | Admitting: Internal Medicine

## 2014-07-25 ENCOUNTER — Ambulatory Visit (INDEPENDENT_AMBULATORY_CARE_PROVIDER_SITE_OTHER): Payer: Medicare Other | Admitting: *Deleted

## 2014-07-25 VITALS — BP 160/70 | HR 73 | Temp 98.5°F | Ht 62.5 in | Wt 177.0 lb

## 2014-07-25 DIAGNOSIS — N289 Disorder of kidney and ureter, unspecified: Secondary | ICD-10-CM

## 2014-07-25 DIAGNOSIS — I10 Essential (primary) hypertension: Secondary | ICD-10-CM

## 2014-07-25 DIAGNOSIS — M25551 Pain in right hip: Secondary | ICD-10-CM

## 2014-07-25 DIAGNOSIS — Z23 Encounter for immunization: Secondary | ICD-10-CM

## 2014-07-25 MED ORDER — AMLODIPINE BESYLATE 5 MG PO TABS: 7.5000 mg | ORAL_TABLET | Freq: Every day | ORAL | Status: DC

## 2014-07-25 NOTE — Assessment & Plan Note (Addendum)
Patient's renal ultrasound was unremarkable. Her serum creatinine normalized to her baseline at 1.2 after decreasing Benicar dose and discontinuing furosemide.  Patient is stage 3B based upon estimated GFR.  I stressed importance of avoiding nephrotoxic agents and dehydration. Continue to monitor electrolytes and kidney function.

## 2014-07-25 NOTE — Progress Notes (Signed)
Subjective:    Patient ID: [name], female    DOB: 1929/07/13, 80 y.o.   MRN: [id number]  HPI  80 year old white female with history of hypertension, hypothyroidism and chronic renal insufficiency for follow-up.  Since previous visit patient's Benicar was decreased to 20 mg. Her amlodipine was also decreased to 5 mg and her furosemide was discontinued.  Patient serum creatinine improved to 1.2.  Patient has a monitoring her home blood pressure readings. Systolic blood pressure readings are in the mid 140s to 160.  She also complains of intermittent right hip pain. She denies any history of trauma or injury. Her symptoms worse with laying on her right side. Patient also has pain near her sacroiliac joint. Her symptoms improving with rest and Tylenol.  Review of Systems Negative for chest pain or shortness of breath, mild lower extremity edema.    Past Medical History  Diagnosis Date  . HYPOTHYROIDISM 04/04/2007  . HYPERTENSION 04/04/2007  . Chronic fatigue   . Depression   . Anemia     History   Social History  . Marital Status: Widowed    Spouse Name: N/A    Number of Children: N/A  . Years of Education: N/A   Occupational History  . Retired    Social History Main Topics  . Smoking status: Former Smoker -- 1.00 packs/day for 7 years    Types: Cigarettes    Quit date: 10/13/1969  . Smokeless tobacco: Not on file  . Alcohol Use: No  . Drug Use: No  . Sexual Activity: Not on file   Other Topics Concern  . Not on file   Social History Narrative    Past Surgical History  Procedure Laterality Date  . Appendectomy    . Cholecystectomy    . Hernia repair    . Abdominal hysterectomy    . Spine surgery  2011    cervical  . Oophorectomy    . Back surgery  2012  . Orif tibia fracture  2012    dr duda    Family History  Problem Relation Age of Onset  . Heart disease Mother   . Cancer Mother 3    renal cell CA  . Lymphoma Son   . Heart disease Father      Allergies  Allergen Reactions  . Oxycodone Hcl     REACTION: itching    Current Outpatient Prescriptions on File Prior to Visit  Medication Sig Dispense Refill  . cetirizine (ZYRTEC) 10 MG tablet Take 10 mg by mouth daily.    . Chlorphen-Pseudoephed-APAP (CORICIDIN D PO) Take by mouth.    . clidinium-chlordiazePOXIDE (LIBRAX) 2.5-5 MG per capsule 2 (two) times daily as needed.     . fluticasone (FLONASE) 50 MCG/ACT nasal spray Place 2 sprays into both nostrils daily. 16 g 5  . HYDROcodone-homatropine (HYCODAN) 5-1.5 MG/5ML syrup Take 5 mLs by mouth every 12 (twelve) hours as needed for cough. 120 mL 0  . levothyroxine (SYNTHROID, LEVOTHROID) 137 MCG tablet TAKE 1 TABLET DAILY BEFORE BREAKFAST 90 tablet 3  . Multiple Vitamin (MULTIVITAMIN) tablet Take 1 tablet by mouth daily.    Marland Kitchen olmesartan (BENICAR) 40 MG tablet Take 0.5 tablets (20 mg total) by mouth daily. 90 tablet 1  . omeprazole (PRILOSEC) 40 MG capsule Take 1 tablet by mouth daily before breakfast.     . sodium chloride (OCEAN) 0.65 % SOLN nasal spray Place 1 spray into both nostrils as needed for congestion.    Marland Kitchen  traMADol (ULTRAM) 50 MG tablet Take 0.5 tablets (25 mg total) by mouth every 12 (twelve) hours as needed (Do not take near bedtime). 30 tablet 1  . zolpidem (AMBIEN) 5 MG tablet Take 1 tablet (5 mg total) by mouth at bedtime as needed for sleep. 30 tablet 3   No current facility-administered medications on file prior to visit.    BP 160/70 mmHg  Pulse 73  Temp(Src) 98.5 F (36.9 C) (Oral)  Ht 5' 2.5" (1.588 m)  Wt 177 lb (80.287 kg)  BMI 31.84 kg/m2    Objective:   Physical Exam  Constitutional: She is oriented to person, place, and time. She appears well-developed and well-nourished. No distress.  Cardiovascular: Normal rate, regular rhythm and normal heart sounds.   No murmur heard. Pulmonary/Chest: Effort normal and breath sounds normal. She has no wheezes.  Musculoskeletal:  Trace lower extremity  edema bilaterally Tendinosis of right trochanteric bursa and right sacroiliac joint. No significant discomfort with internal or external rotation of right hip.  Neurological: She is alert and oriented to person, place, and time. No cranial nerve deficit.  Psychiatric: She has a normal mood and affect. Her behavior is normal.          Assessment & Plan:

## 2014-07-25 NOTE — Patient Instructions (Signed)
Continue to monitor your blood pressure at home Please complete the following lab tests before your next follow up appointment: BMET - 401.9 Please contact our office if your right hip pain does not improve or gets worse.

## 2014-07-25 NOTE — Assessment & Plan Note (Signed)
Patient experiencing intermittent right hip pain. Unclear whether the pain from bursitis versus lumbar radicular pain. Refer to orthopedic specialist for further evaluation.

## 2014-07-25 NOTE — Progress Notes (Signed)
Pre visit review using our clinic review tool, if applicable. No additional management support is needed unless otherwise documented below in the visit note. 

## 2014-07-25 NOTE — Assessment & Plan Note (Signed)
Furosemide discontinued due to renal insufficiency. Benicar reduced to 20 mg. BP trending higher. Increase amlodipine to 7.5 mg. Reassess in 6 weeks. BP: (!) 160/70 mmHg     .

## 2014-07-31 DIAGNOSIS — M25551 Pain in right hip: Secondary | ICD-10-CM | POA: Diagnosis not present

## 2014-07-31 DIAGNOSIS — M545 Low back pain: Secondary | ICD-10-CM | POA: Diagnosis not present

## 2014-07-31 DIAGNOSIS — M25561 Pain in right knee: Secondary | ICD-10-CM | POA: Diagnosis not present

## 2014-08-09 ENCOUNTER — Other Ambulatory Visit: Payer: Self-pay | Admitting: Internal Medicine

## 2014-08-15 ENCOUNTER — Ambulatory Visit: Payer: Medicare Other | Admitting: Family Medicine

## 2014-08-27 DIAGNOSIS — M25561 Pain in right knee: Secondary | ICD-10-CM | POA: Diagnosis not present

## 2014-08-27 DIAGNOSIS — M4726 Other spondylosis with radiculopathy, lumbar region: Secondary | ICD-10-CM | POA: Diagnosis not present

## 2014-08-30 ENCOUNTER — Encounter: Payer: Self-pay | Admitting: Family Medicine

## 2014-08-30 ENCOUNTER — Telehealth: Payer: Self-pay | Admitting: Internal Medicine

## 2014-08-30 ENCOUNTER — Ambulatory Visit (INDEPENDENT_AMBULATORY_CARE_PROVIDER_SITE_OTHER): Payer: Medicare Other | Admitting: Family Medicine

## 2014-08-30 VITALS — BP 144/60 | HR 78 | Temp 98.8°F | Ht 62.5 in | Wt 176.3 lb

## 2014-08-30 DIAGNOSIS — I10 Essential (primary) hypertension: Secondary | ICD-10-CM | POA: Diagnosis not present

## 2014-08-30 DIAGNOSIS — R42 Dizziness and giddiness: Secondary | ICD-10-CM | POA: Diagnosis not present

## 2014-08-30 DIAGNOSIS — R531 Weakness: Secondary | ICD-10-CM | POA: Diagnosis not present

## 2014-08-30 NOTE — Progress Notes (Signed)
Pre visit review using our clinic review tool, if applicable. No additional management support is needed unless otherwise documented below in the visit note. 

## 2014-08-30 NOTE — Telephone Encounter (Signed)
Scheduled an appt with Dr Elease Hashimoto at 4:30 today but pt would like to know what Dr Shawna Orleans thinks and if she should be seen.  I told pt I would call her back if Dr Shawna Orleans thinks she doesn't need to be seen.  Please advise

## 2014-08-30 NOTE — Telephone Encounter (Signed)
[facility] Patient Name: [name] DOB: [birth year] Initial Comment Caller states that yesterday she began feeling shaky, broke out in cold sweat with abdominal pain, bp was 127/63 then, this morning was 155/62, wants to know if she should continue taking bp med Nurse Assessment Nurse: Erlene Quan, RN, Manuela Schwartz Date/Time Eilene Ghazi Time): 08/30/2014 9:59:59 AM Confirm and document reason for call. If symptomatic, describe symptoms. ---Caller states that yesterday she began feeling shaky, broke out in cold sweat with abdominal pain, bp was 127/63 then - she was in the kitchen cooking - states this morning was 155/62 but she is not having any of the other symptoms she was having yesterday - wants to know if she should continue taking bp med- she has been on this same medication for years and has had no changes in her medications at all but she just knows its the medicine because she has never had her top number as low as 127 before - states the symptoms finally just went away on their own , she did have a few loose stools yesterday also - declined triage would like a call back form Dr Shawna Orleans or his nurse to tell her what to do Has the patient traveled out of the country within the last 30 days? ---Not Applicable Does the patient require triage? ---Declined Triage Please document clinical information provided and list any resource used. ---Advised caller that I will send a message to Dr Shawna Orleans and ask that she get a return call - caller states she is not going to take the medication until she hears back from someone - Advised Ms. Snellings if she begins having symptoms of any kind she needs to call us back right away - she verbalized understanding Guidelines Guideline Title Affirmed Question Affirmed Notes Final Disposition User

## 2014-08-30 NOTE — Telephone Encounter (Signed)
I agree she should be seen/evaluated.  Unclear whether her symptoms side effect BP medication vs other

## 2014-08-30 NOTE — Progress Notes (Signed)
Subjective:    Patient ID: [name], female    DOB: 11/15/1928, 81 y.o.   MRN: [id number]  HPI  Patient is seen with nonspecific episode of weakness and lightheadedness yesterday. This occurred around 11 AM. She was fixing breakfast. This occurred about an hour half after she taken her blood pressure medications. She had some cold sweats felt lightheaded but no syncope. She had no chest pains and no dyspnea. No focal weakness. No confusion. Her son came over and took her blood pressure which was 127/63 which is unusually low for her. She takes amlodipine and Benicar for hypertension. She denies any recent dysuria. No recent nausea, vomiting, or diarrhea. Her symptoms were very transient and lasted several minutes and then fully resolved. She's had no exertional symptoms whatsoever and has felt completely normal today. She feels that she was possibly slightly dehydrated as she had some coffee but not much else to drink yesterday.  She had recent electrolytes which were unremarkable. She did have some diffuse abdominal cramps yesterday but recurred after the episode of weakness. She did not relate any speech changes or visual changes.  Past Medical History  Diagnosis Date  . HYPOTHYROIDISM 04/04/2007  . HYPERTENSION 04/04/2007  . Chronic fatigue   . Depression   . Anemia    Past Surgical History  Procedure Laterality Date  . Appendectomy    . Cholecystectomy    . Hernia repair    . Abdominal hysterectomy    . Spine surgery  2011    cervical  . Oophorectomy    . Back surgery  2012  . Orif tibia fracture  2012    dr duda    reports that she quit smoking about 44 years ago. Her smoking use included Cigarettes. She has a 7 pack-year smoking history. She does not have any smokeless tobacco history on file. She reports that she does not drink alcohol or use illicit drugs. family history includes Cancer (age of onset: 42) in her mother; Heart disease in her father and mother; Lymphoma in  her son. Allergies  Allergen Reactions  . Oxycodone Hcl     REACTION: itching     Review of Systems  Constitutional: Positive for fatigue. Negative for fever and chills.  Eyes: Negative for visual disturbance.  Respiratory: Negative for shortness of breath.   Cardiovascular: Negative for chest pain, palpitations and leg swelling.  Gastrointestinal: Negative for nausea, vomiting and diarrhea.  Neurological: Positive for dizziness and light-headedness. Negative for syncope and headaches.  Psychiatric/Behavioral: Negative for confusion.       Objective:   Physical Exam  Constitutional: She is oriented to person, place, and time. She appears well-developed and well-nourished.  HENT:  Mouth/Throat: Oropharynx is clear and moist.  Neck: Neck supple. No JVD present.  No carotid bruits  Cardiovascular: Normal rate and regular rhythm.   Pulmonary/Chest: Effort normal and breath sounds normal. No respiratory distress. She has no wheezes. She has no rales.  Abdominal: Soft. There is no tenderness.  Musculoskeletal: She exhibits no edema.  Neurological: She is alert and oriented to person, place, and time. No cranial nerve deficit. Coordination normal.  No focal weakness. Gait is normal  Psychiatric: She has a normal mood and affect. Her behavior is normal.          Assessment & Plan:  Transient weakness yesterday. Symptoms were very short-lived. Question transient hypotension. She has standing blood pressure today 152/70 but she did not take her medications this morning. We  have recommended increased hydration. She will get back on her usual medications and observe for now. They will monitor her blood pressures closely

## 2014-08-30 NOTE — Telephone Encounter (Signed)
Pt aware, she will keep her appt with Dr Elease Hashimoto

## 2014-08-30 NOTE — Patient Instructions (Signed)
Stay hydrated Let me know if any blood pressure dropping < 110/60 or any recurrent light headedness

## 2014-09-03 ENCOUNTER — Other Ambulatory Visit: Payer: Self-pay | Admitting: Orthopedic Surgery

## 2014-09-03 DIAGNOSIS — M545 Low back pain: Secondary | ICD-10-CM

## 2014-09-05 ENCOUNTER — Ambulatory Visit
Admission: RE | Admit: 2014-09-05 | Discharge: 2014-09-05 | Disposition: A | Discharge status: Home / Self Care | Payer: Medicare Other | Source: Ambulatory Visit | Attending: Orthopedic Surgery | Admitting: Orthopedic Surgery

## 2014-09-05 DIAGNOSIS — M5126 Other intervertebral disc displacement, lumbar region: Secondary | ICD-10-CM | POA: Diagnosis not present

## 2014-09-05 DIAGNOSIS — M4807 Spinal stenosis, lumbosacral region: Secondary | ICD-10-CM | POA: Diagnosis not present

## 2014-09-05 DIAGNOSIS — D1809 Hemangioma of other sites: Secondary | ICD-10-CM | POA: Diagnosis not present

## 2014-09-05 DIAGNOSIS — M545 Low back pain: Secondary | ICD-10-CM

## 2014-09-11 DIAGNOSIS — M4726 Other spondylosis with radiculopathy, lumbar region: Secondary | ICD-10-CM | POA: Diagnosis not present

## 2014-09-11 DIAGNOSIS — M5431 Sciatica, right side: Secondary | ICD-10-CM | POA: Diagnosis not present

## 2014-09-11 DIAGNOSIS — M25561 Pain in right knee: Secondary | ICD-10-CM | POA: Diagnosis not present

## 2014-10-02 ENCOUNTER — Other Ambulatory Visit (INDEPENDENT_AMBULATORY_CARE_PROVIDER_SITE_OTHER): Payer: Medicare Other

## 2014-10-02 DIAGNOSIS — I10 Essential (primary) hypertension: Secondary | ICD-10-CM

## 2014-10-02 LAB — BASIC METABOLIC PANEL
BUN: 14 mg/dL (ref 6–23)
CALCIUM: 9.2 mg/dL (ref 8.4–10.5)
CHLORIDE: 97 meq/L (ref 96–112)
CO2: 25 meq/L (ref 19–32)
Creatinine, Ser: 1.39 mg/dL — ABNORMAL HIGH (ref 0.40–1.20)
GFR: 38.45 mL/min — ABNORMAL LOW (ref >60.00)
Glucose, Bld: 96 mg/dL (ref 70–99)
Potassium: 4.7 mEq/L (ref 3.5–5.1)
SODIUM: 126 meq/L — AB (ref 135–145)

## 2014-10-05 ENCOUNTER — Encounter: Payer: Self-pay | Admitting: Internal Medicine

## 2014-10-05 ENCOUNTER — Ambulatory Visit (INDEPENDENT_AMBULATORY_CARE_PROVIDER_SITE_OTHER): Payer: Medicare Other | Admitting: Internal Medicine

## 2014-10-05 VITALS — BP 144/50 | HR 78 | Temp 98.9°F | Ht 62.5 in | Wt 171.0 lb

## 2014-10-05 DIAGNOSIS — F4323 Adjustment disorder with mixed anxiety and depressed mood: Secondary | ICD-10-CM

## 2014-10-05 DIAGNOSIS — M25551 Pain in right hip: Secondary | ICD-10-CM

## 2014-10-05 DIAGNOSIS — E871 Hypo-osmolality and hyponatremia: Secondary | ICD-10-CM

## 2014-10-05 DIAGNOSIS — I10 Essential (primary) hypertension: Secondary | ICD-10-CM | POA: Diagnosis not present

## 2014-10-05 DIAGNOSIS — R42 Dizziness and giddiness: Secondary | ICD-10-CM | POA: Diagnosis not present

## 2014-10-05 MED ORDER — LIDOCAINE 5 % EX PTCH: 1.0000 | MEDICATED_PATCH | Freq: Two times a day (BID) | CUTANEOUS | Status: DC

## 2014-10-05 MED ORDER — PREDNISONE 20 MG PO TABS: ORAL_TABLET | ORAL | Status: DC

## 2014-10-05 NOTE — Assessment & Plan Note (Signed)
Patient declines SSRI for now.  Patient encouraged to reconsider if she has persistent depressive symptoms.

## 2014-10-05 NOTE — Assessment & Plan Note (Signed)
Stable.  Continue Benicar and amlodipine. BP: (!) 144/50 mmHg  Home BP readings normal.

## 2014-10-05 NOTE — Assessment & Plan Note (Signed)
Patient followed by orthopedic specialist. MRI of lumbar spine did not show evidence of radiculopathy. It is still not clear whether lumbar pathology contributing to her symptoms.  Patient declines cortisone injection of right trochanteric bursa. Patient advised to apply ice 2-3 times a day. Trial of Lidoderm patch and prednisone taper.

## 2014-10-05 NOTE — Assessment & Plan Note (Signed)
Patient symptoms diabetes side effects from tramadol. Patient advised to use least amount of medication as possible. She can also take Tylenol 500 mg along with tramadol for right hip pain.

## 2014-10-05 NOTE — Progress Notes (Signed)
Pre visit review using our clinic review tool, if applicable. No additional management support is needed unless otherwise documented below in the visit note. 

## 2014-10-05 NOTE — Progress Notes (Addendum)
Subjective:    Patient ID: [name], female    DOB: September 03, 1928, 81 y.o.   MRN: [id number]  HPI  81 year old white female with history of hypertension, hypothyroidism and chronic renal sufficiency for follow-up. Since previous visit patient seen by Dr. Elease Hashimoto for dizziness. Patient reports persistent issues with fatigue and now issues with anxiety/"feeling nervous".  Over the last couple months she has been struggling with chronic right hip pain that radiates down to her foot. She is followed by orthopedic specialist-Dr. Sharol Given. She underwent MRI of lumbar spine without contrast on 09/05/2014. Dr. Sharol Given to was suspicious that her symptoms are radicular from lumbar pathology.  MRI of lumbar spine negative for acute process. She is noticed to have diffuse disc bulging and spondylosis at multiple levels.  She has been using Tramadol for pain once to twice daily.  Her recent basic metabolic panel shows hyponatremia.  She was previously on lasix which was stopped secondary to worsening serum Cr. No symptoms of CHF.  Review of Systems Lack of energy, right hip and leg pain,  Poor sleep    Past Medical History  Diagnosis Date  . HYPOTHYROIDISM 04/04/2007  . HYPERTENSION 04/04/2007  . Chronic fatigue   . Depression   . Anemia   . Chronic renal insufficiency, stage III (moderate)     History   Social History  . Marital Status: Widowed    Spouse Name: N/A  . Number of Children: N/A  . Years of Education: N/A   Occupational History  . Retired    Social History Main Topics  . Smoking status: Former Smoker -- 1.00 packs/day for 7 years    Types: Cigarettes    Quit date: 10/13/1969  . Smokeless tobacco: Not on file  . Alcohol Use: No  . Drug Use: No  . Sexual Activity: Not on file   Other Topics Concern  . Not on file   Social History Narrative    Past Surgical History  Procedure Laterality Date  . Appendectomy    . Cholecystectomy    . Hernia repair    . Abdominal  hysterectomy    . Spine surgery  2011    cervical  . Oophorectomy    . Back surgery  2012  . Orif tibia fracture  2012    dr duda    Family History  Problem Relation Age of Onset  . Heart disease Mother   . Cancer Mother 86    renal cell CA  . Lymphoma Son   . Heart disease Father     Allergies  Allergen Reactions  . Oxycodone Hcl     REACTION: itching    Current Outpatient Prescriptions on File Prior to Visit  Medication Sig Dispense Refill  . amLODipine (NORVASC) 5 MG tablet Take 1.5 tablets (7.5 mg total) by mouth daily. 135 tablet 1  . cetirizine (ZYRTEC) 10 MG tablet Take 10 mg by mouth daily.    . clidinium-chlordiazePOXIDE (LIBRAX) 2.5-5 MG per capsule 2 (two) times daily as needed.     . fluticasone (FLONASE) 50 MCG/ACT nasal spray Place 2 sprays into both nostrils daily. 16 g 5  . levothyroxine (SYNTHROID, LEVOTHROID) 137 MCG tablet TAKE 1 TABLET DAILY BEFORE BREAKFAST 90 tablet 3  . Multiple Vitamin (MULTIVITAMIN) tablet Take 1 tablet by mouth daily.    Marland Kitchen olmesartan (BENICAR) 40 MG tablet Take 0.5 tablets (20 mg total) by mouth daily. 90 tablet 1  . omeprazole (PRILOSEC) 40 MG capsule Take 1 tablet  by mouth daily before breakfast.     . potassium chloride SA (K-DUR,KLOR-CON) 20 MEQ tablet TAKE 1 TABLET DAILY 90 tablet 3  . sodium chloride (OCEAN) 0.65 % SOLN nasal spray Place 1 spray into both nostrils as needed for congestion.    . traMADol (ULTRAM) 50 MG tablet Take 0.5 tablets (25 mg total) by mouth every 12 (twelve) hours as needed (Do not take near bedtime). 30 tablet 1  . zolpidem (AMBIEN) 5 MG tablet Take 1 tablet (5 mg total) by mouth at bedtime as needed for sleep. 30 tablet 3   No current facility-administered medications on file prior to visit.    BP 144/50 mmHg  Pulse 78  Temp(Src) 98.9 F (37.2 C) (Oral)  Ht 5' 2.5" (1.588 m)  Wt 171 lb (77.565 kg)  BMI 30.76 kg/m2       Objective:   Physical Exam  Constitutional: She is oriented to  person, place, and time. She appears well-developed and well-nourished. No distress.  Cardiovascular: Normal rate, regular rhythm and normal heart sounds.   No murmur heard. Pulmonary/Chest: Effort normal and breath sounds normal. She has no wheezes.  Musculoskeletal:  Right hip tenderness, no significant pain with internal or external rotation.  No pain with percussion of lumbar spine  Neurological: She is alert and oriented to person, place, and time. No cranial nerve deficit.  Skin: Skin is warm and dry.  Psychiatric: Her behavior is normal.  Slightly tearful          Assessment & Plan:

## 2014-10-08 ENCOUNTER — Telehealth: Payer: Self-pay | Admitting: Internal Medicine

## 2014-10-08 NOTE — Telephone Encounter (Signed)
emmi mailed  °

## 2014-10-09 ENCOUNTER — Other Ambulatory Visit: Payer: Self-pay | Admitting: Internal Medicine

## 2014-10-09 ENCOUNTER — Ambulatory Visit (INDEPENDENT_AMBULATORY_CARE_PROVIDER_SITE_OTHER)
Admission: RE | Admit: 2014-10-09 | Discharge: 2014-10-09 | Disposition: A | Discharge status: Home / Self Care | Payer: Medicare Other | Source: Ambulatory Visit | Attending: Internal Medicine | Admitting: Internal Medicine

## 2014-10-09 ENCOUNTER — Other Ambulatory Visit (INDEPENDENT_AMBULATORY_CARE_PROVIDER_SITE_OTHER): Payer: Medicare Other

## 2014-10-09 DIAGNOSIS — E871 Hypo-osmolality and hyponatremia: Secondary | ICD-10-CM

## 2014-10-09 DIAGNOSIS — R05 Cough: Secondary | ICD-10-CM | POA: Diagnosis not present

## 2014-10-09 DIAGNOSIS — R059 Cough, unspecified: Secondary | ICD-10-CM

## 2014-10-09 LAB — BASIC METABOLIC PANEL
BUN: 12 mg/dL (ref 6–23)
CHLORIDE: 100 meq/L (ref 96–112)
CO2: 25 mEq/L (ref 19–32)
Calcium: 9.5 mg/dL (ref 8.4–10.5)
Creatinine, Ser: 1.34 mg/dL — ABNORMAL HIGH (ref 0.40–1.20)
GFR: 40.11 mL/min — ABNORMAL LOW (ref >60.00)
GLUCOSE: 91 mg/dL (ref 70–99)
POTASSIUM: 5.3 meq/L — AB (ref 3.5–5.1)
Sodium: 131 mEq/L — ABNORMAL LOW (ref 135–145)

## 2014-10-09 NOTE — Assessment & Plan Note (Signed)
Repeat BMET in 1 week.  Check serum osm, urine osm and spot urine sodium.  She may have SIADH.

## 2014-10-10 ENCOUNTER — Encounter: Payer: Self-pay | Admitting: Internal Medicine

## 2014-10-10 ENCOUNTER — Ambulatory Visit (INDEPENDENT_AMBULATORY_CARE_PROVIDER_SITE_OTHER): Payer: Medicare Other | Admitting: Internal Medicine

## 2014-10-10 ENCOUNTER — Telehealth: Payer: Self-pay | Admitting: Internal Medicine

## 2014-10-10 VITALS — BP 134/50 | HR 74 | Temp 99.1°F | Ht 62.5 in | Wt 171.0 lb

## 2014-10-10 DIAGNOSIS — M25551 Pain in right hip: Secondary | ICD-10-CM | POA: Diagnosis not present

## 2014-10-10 DIAGNOSIS — R42 Dizziness and giddiness: Secondary | ICD-10-CM | POA: Diagnosis not present

## 2014-10-10 DIAGNOSIS — E871 Hypo-osmolality and hyponatremia: Secondary | ICD-10-CM | POA: Diagnosis not present

## 2014-10-10 NOTE — Progress Notes (Signed)
Subjective:    Patient ID: [name], female    DOB: [birth year], 81 y.o.   MRN: [id number]  HPI  81 year old white female recently seen for right hip pain, dizziness and anxiety for follow-up.  Patient reports issues with anxiety slightly improved. Unclear whether her improvement in patient's hyponatremia related.  Patient has not started Lidoderm patch or prednisone for right hip pain. She has reconsidered trial of steroid injection to right trochanteric bursa.  Hyponatremia-her sodium level on 10/02/2014 was 126. It is improved to 131 on 10/09/2014.  Her serum osm, urine osm and spot urine sodium pending.  Her CXR was normal.   Lab Results  Component Value Date   TSH 0.92 06/18/2014      Review of Systems Negative for shortness of breath or orthopnea.  She has been hydrating ever since her visit with Dr. Elease Hashimoto    Past Medical History  Diagnosis Date  . HYPOTHYROIDISM 04/04/2007  . HYPERTENSION 04/04/2007  . Chronic fatigue   . Depression   . Anemia   . Chronic renal insufficiency, stage III (moderate)     History   Social History  . Marital Status: Widowed    Spouse Name: N/A  . Number of Children: N/A  . Years of Education: N/A   Occupational History  . Retired    Social History Main Topics  . Smoking status: Former Smoker -- 1.00 packs/day for 7 years    Types: Cigarettes    Quit date: 10/13/1969  . Smokeless tobacco: Not on file  . Alcohol Use: No  . Drug Use: No  . Sexual Activity: Not on file   Other Topics Concern  . Not on file   Social History Narrative    Past Surgical History  Procedure Laterality Date  . Appendectomy    . Cholecystectomy    . Hernia repair    . Abdominal hysterectomy    . Spine surgery  2011    cervical  . Oophorectomy    . Back surgery  2012  . Orif tibia fracture  2012    dr duda    Family History  Problem Relation Age of Onset  . Heart disease Mother   . Cancer Mother 9    renal cell CA  . Lymphoma  Son   . Heart disease Father     Allergies  Allergen Reactions  . Oxycodone Hcl     REACTION: itching    Current Outpatient Prescriptions on File Prior to Visit  Medication Sig Dispense Refill  . amLODipine (NORVASC) 5 MG tablet Take 1.5 tablets (7.5 mg total) by mouth daily. 135 tablet 1  . cetirizine (ZYRTEC) 10 MG tablet Take 10 mg by mouth daily.    . clidinium-chlordiazePOXIDE (LIBRAX) 2.5-5 MG per capsule 2 (two) times daily as needed.     . fluticasone (FLONASE) 50 MCG/ACT nasal spray Place 2 sprays into both nostrils daily. 16 g 5  . levothyroxine (SYNTHROID, LEVOTHROID) 137 MCG tablet TAKE 1 TABLET DAILY BEFORE BREAKFAST 90 tablet 3  . lidocaine (LIDODERM) 5 % Place 1 patch onto the skin every 12 (twelve) hours. Remove & Discard patch within 12 hours or as directed by MD 10 patch 1  . Multiple Vitamin (MULTIVITAMIN) tablet Take 1 tablet by mouth daily.    Marland Kitchen olmesartan (BENICAR) 40 MG tablet Take 0.5 tablets (20 mg total) by mouth daily. 90 tablet 1  . omeprazole (PRILOSEC) 40 MG capsule Take 1 tablet by mouth daily before breakfast.     .  predniSONE (DELTASONE) 20 MG tablet One tab twice daily for 3 days, then once daily for 3 days, then 1/2 tab for 4 days. 11 tablet 0  . sodium chloride (OCEAN) 0.65 % SOLN nasal spray Place 1 spray into both nostrils as needed for congestion.    . traMADol (ULTRAM) 50 MG tablet Take 0.5 tablets (25 mg total) by mouth every 12 (twelve) hours as needed (Do not take near bedtime). 30 tablet 1  . zolpidem (AMBIEN) 5 MG tablet Take 1 tablet (5 mg total) by mouth at bedtime as needed for sleep. 30 tablet 3   No current facility-administered medications on file prior to visit.    BP 134/50 mmHg  Pulse 74  Temp(Src) 99.1 F (37.3 C) (Oral)  Ht 5' 2.5" (1.588 m)  Wt 171 lb (77.565 kg)  BMI 30.76 kg/m2    Objective:   Physical Exam  Constitutional: She is oriented to person, place, and time. She appears well-developed and well-nourished.    HENT:  Head: Normocephalic.  Right Ear: External ear normal.  Left Ear: External ear normal.  Eyes: Conjunctivae and EOM are normal. Pupils are equal, round, and reactive to light.  Neck: Neck supple.  Cardiovascular: Normal rate, regular rhythm and normal heart sounds.   No murmur heard. Pulmonary/Chest: Effort normal and breath sounds normal. She has no wheezes.  Musculoskeletal: She exhibits no edema.  Left hip tenderness near trochanteric bursa  Neurological: She is alert and oriented to person, place, and time. No cranial nerve deficit.  Skin: Skin is warm.  Psychiatric: She has a normal mood and affect. Her behavior is normal.          Assessment & Plan:

## 2014-10-10 NOTE — Assessment & Plan Note (Signed)
Patient likely has SIADH. Her chest x-ray is negative. SIADH may be sugared by right hip pain. She has experienced intermittent dizziness. Obtain CT of head to rule out CNS disturbance. Monitor BMET in 2 weeks. Patient instructed to reduce her fluid intake.

## 2014-10-10 NOTE — Patient Instructions (Addendum)
You can try substituting omeprazole with over the counter ranitidine 150 mg twice daily for 1 week If your reflux symptoms gets worse, you can resume taking omeprazole Decrease your fluid (water) intake as directed Return in 2 weeks for blood test - BMET (use hyponatremia code) If your right hip pain improves, then you don't have to use lidoderm patch or prednisone

## 2014-10-10 NOTE — Telephone Encounter (Signed)
Pt returning your call

## 2014-10-10 NOTE — Assessment & Plan Note (Signed)
Patient reconsidered cortisone injection to right trochanteric bursa. Area prepped with Betadine. Aseptic technique utilized to inject 80 mg of Depo-Medrol mixed with 1% lidocaine (1:1 ratio).  No complications.    If right hip pain resolves with cortisone injection, patient advised to not use Lidoderm patch or prednisone.

## 2014-10-10 NOTE — Telephone Encounter (Signed)
Told pt to keep her appt with Dr Shawna Orleans today at [facility]

## 2014-10-10 NOTE — Progress Notes (Signed)
Pre visit review using our clinic review tool, if applicable. No additional management support is needed unless otherwise documented below in the visit note. 

## 2014-10-11 ENCOUNTER — Inpatient Hospital Stay: Admission: RE | Admit: 2014-10-11 | Payer: Medicare Other | Source: Ambulatory Visit

## 2014-10-11 LAB — OSMOLALITY: OSMOLALITY: 281 mosm/kg (ref 275–300)

## 2014-10-11 LAB — SODIUM, URINE, RANDOM: SODIUM UR: 20 meq/L

## 2014-10-11 LAB — OSMOLALITY, URINE: Osmolality, Ur: 210 mOsm/kg — ABNORMAL LOW (ref 390–1090)

## 2014-10-12 ENCOUNTER — Telehealth: Payer: Self-pay | Admitting: Internal Medicine

## 2014-10-12 DIAGNOSIS — E871 Hypo-osmolality and hyponatremia: Secondary | ICD-10-CM

## 2014-10-12 NOTE — Telephone Encounter (Signed)
Pt would like to know if she needs to come in prior to her fup on 3/28 for labs. pls advise

## 2014-10-15 NOTE — Telephone Encounter (Signed)
Yes, she should come in for BMET - use hyponatremia code

## 2014-10-15 NOTE — Telephone Encounter (Signed)
Future order placed, please call and schedule

## 2014-10-15 NOTE — Telephone Encounter (Signed)
Done

## 2014-10-17 ENCOUNTER — Ambulatory Visit (INDEPENDENT_AMBULATORY_CARE_PROVIDER_SITE_OTHER)
Admission: RE | Admit: 2014-10-17 | Discharge: 2014-10-17 | Disposition: A | Discharge status: Home / Self Care | Payer: Medicare Other | Source: Ambulatory Visit | Attending: Internal Medicine | Admitting: Internal Medicine

## 2014-10-17 DIAGNOSIS — R42 Dizziness and giddiness: Secondary | ICD-10-CM | POA: Diagnosis not present

## 2014-10-17 DIAGNOSIS — E871 Hypo-osmolality and hyponatremia: Secondary | ICD-10-CM

## 2014-10-30 ENCOUNTER — Encounter: Payer: Self-pay | Admitting: Internal Medicine

## 2014-10-30 ENCOUNTER — Other Ambulatory Visit (INDEPENDENT_AMBULATORY_CARE_PROVIDER_SITE_OTHER): Payer: Medicare Other

## 2014-10-30 DIAGNOSIS — Z79899 Other long term (current) drug therapy: Secondary | ICD-10-CM | POA: Diagnosis not present

## 2014-10-30 DIAGNOSIS — E871 Hypo-osmolality and hyponatremia: Secondary | ICD-10-CM | POA: Diagnosis not present

## 2014-10-30 DIAGNOSIS — Z79891 Long term (current) use of opiate analgesic: Secondary | ICD-10-CM | POA: Diagnosis not present

## 2014-10-30 LAB — BASIC METABOLIC PANEL
BUN: 17 mg/dL (ref 6–23)
CHLORIDE: 102 meq/L (ref 96–112)
CO2: 25 mEq/L (ref 19–32)
CREATININE: 1.29 mg/dL — AB (ref 0.40–1.20)
Calcium: 9.2 mg/dL (ref 8.4–10.5)
GFR: 41.9 mL/min — ABNORMAL LOW (ref >60.00)
GLUCOSE: 129 mg/dL — AB (ref 70–99)
Potassium: 4 mEq/L (ref 3.5–5.1)
Sodium: 132 mEq/L — ABNORMAL LOW (ref 135–145)

## 2014-11-05 ENCOUNTER — Encounter: Payer: Self-pay | Admitting: Internal Medicine

## 2014-11-05 ENCOUNTER — Ambulatory Visit (INDEPENDENT_AMBULATORY_CARE_PROVIDER_SITE_OTHER): Payer: Medicare Other | Admitting: Internal Medicine

## 2014-11-05 VITALS — BP 134/70 | HR 76 | Temp 98.0°F | Ht 62.5 in | Wt 167.0 lb

## 2014-11-05 DIAGNOSIS — M25551 Pain in right hip: Secondary | ICD-10-CM | POA: Diagnosis not present

## 2014-11-05 DIAGNOSIS — E871 Hypo-osmolality and hyponatremia: Secondary | ICD-10-CM | POA: Diagnosis not present

## 2014-11-05 DIAGNOSIS — I10 Essential (primary) hypertension: Secondary | ICD-10-CM | POA: Diagnosis not present

## 2014-11-05 DIAGNOSIS — N289 Disorder of kidney and ureter, unspecified: Secondary | ICD-10-CM | POA: Diagnosis not present

## 2014-11-05 NOTE — Assessment & Plan Note (Signed)
Improved after steroid injection.  Mild recurrence of symptoms.  Continue cool compress.  Patient will try sleeping on her left side.

## 2014-11-05 NOTE — Patient Instructions (Signed)
Please limit your water intake to 3-4 glasses per day Follow up with your dermatologist re: possible actinic keratosis left forehead Please complete the following lab tests before your next follow up appointment: BMET - hyponatremia

## 2014-11-05 NOTE — Progress Notes (Signed)
Subjective:    Patient ID: [name], female    DOB: 06-26-1929, 81 y.o.   MRN: [id number]  HPI  81 year old white female for follow-up regarding hypertension and hyponatremia. Patient also produces seen for right hip pain. She reports her symptoms significantly improved after receiving steroid injection. Her symptoms slightly getting worse over the last 1 week.  Patient serum osmolality was 281, urine sodium 20 and urine osmolality 210.  Lab Results  Component Value Date   NA 132* 10/30/2014   K 4.0 10/30/2014   CL 102 10/30/2014   CO2 25 10/30/2014    Lab Results  Component Value Date   CREATININE 1.29* 10/30/2014    Review of Systems Negative for chest pain, mild right hip pain.  She sleeps on her right side.    Past Medical History  Diagnosis Date  . HYPOTHYROIDISM 04/04/2007  . HYPERTENSION 04/04/2007  . Chronic fatigue   . Depression   . Anemia   . Chronic renal insufficiency, stage III (moderate)     History   Social History  . Marital Status: Widowed    Spouse Name: N/A  . Number of Children: N/A  . Years of Education: N/A   Occupational History  . Retired    Social History Main Topics  . Smoking status: Former Smoker -- 1.00 packs/day for 7 years    Types: Cigarettes    Quit date: 10/13/1969  . Smokeless tobacco: Not on file  . Alcohol Use: No  . Drug Use: No  . Sexual Activity: Not on file   Other Topics Concern  . Not on file   Social History Narrative    Past Surgical History  Procedure Laterality Date  . Appendectomy    . Cholecystectomy    . Hernia repair    . Abdominal hysterectomy    . Spine surgery  2011    cervical  . Oophorectomy    . Back surgery  2012  . Orif tibia fracture  2012    dr duda    Family History  Problem Relation Age of Onset  . Heart disease Mother   . Cancer Mother 65    renal cell CA  . Lymphoma Son   . Heart disease Father     Allergies  Allergen Reactions  . Oxycodone Hcl     REACTION:  itching    Current Outpatient Prescriptions on File Prior to Visit  Medication Sig Dispense Refill  . amLODipine (NORVASC) 5 MG tablet Take 1.5 tablets (7.5 mg total) by mouth daily. 135 tablet 1  . cetirizine (ZYRTEC) 10 MG tablet Take 10 mg by mouth daily.    . clidinium-chlordiazePOXIDE (LIBRAX) 2.5-5 MG per capsule 2 (two) times daily as needed.     . fluticasone (FLONASE) 50 MCG/ACT nasal spray Place 2 sprays into both nostrils daily. 16 g 5  . levothyroxine (SYNTHROID, LEVOTHROID) 137 MCG tablet TAKE 1 TABLET DAILY BEFORE BREAKFAST 90 tablet 3  . Multiple Vitamin (MULTIVITAMIN) tablet Take 1 tablet by mouth daily.    Marland Kitchen olmesartan (BENICAR) 40 MG tablet Take 0.5 tablets (20 mg total) by mouth daily. 90 tablet 1  . omeprazole (PRILOSEC) 40 MG capsule Take 1 tablet by mouth daily before breakfast.     . sodium chloride (OCEAN) 0.65 % SOLN nasal spray Place 1 spray into both nostrils as needed for congestion.    . traMADol (ULTRAM) 50 MG tablet Take 0.5 tablets (25 mg total) by mouth every 12 (twelve) hours as  needed (Do not take near bedtime). 30 tablet 1  . zolpidem (AMBIEN) 5 MG tablet Take 1 tablet (5 mg total) by mouth at bedtime as needed for sleep. 30 tablet 3   No current facility-administered medications on file prior to visit.    BP 134/70 mmHg  Pulse 76  Temp(Src) 98 F (36.7 C) (Oral)  Ht 5' 2.5" (1.588 m)  Wt 167 lb (75.751 kg)  BMI 30.04 kg/m2    Objective:   Physical Exam  Constitutional: She is oriented to person, place, and time. She appears well-developed and well-nourished.  HENT:  Head: Normocephalic and atraumatic.  Cardiovascular: Normal rate, regular rhythm and normal heart sounds.   No murmur heard. Pulmonary/Chest: Effort normal and breath sounds normal. She has no wheezes.  Neurological: She is alert and oriented to person, place, and time. No cranial nerve deficit.  Psychiatric: She has a normal mood and affect. Her behavior is normal.           Assessment & Plan:

## 2014-11-05 NOTE — Assessment & Plan Note (Signed)
Stable. Lab Results  Component Value Date   CREATININE 1.29* 10/30/2014

## 2014-11-05 NOTE — Progress Notes (Signed)
Pre visit review using our clinic review tool, if applicable. No additional management support is needed unless otherwise documented below in the visit note. 

## 2014-11-05 NOTE — Assessment & Plan Note (Signed)
Stable.  BP: 134/70 mmHg

## 2014-11-05 NOTE — Assessment & Plan Note (Signed)
Patient serum osmolality was 281, urine sodium 20, and urine osmolality 210.  Possible SIADH.  CXR and CT of head normal.  Continue fluid restriction.

## 2014-11-07 DIAGNOSIS — D239 Other benign neoplasm of skin, unspecified: Secondary | ICD-10-CM | POA: Diagnosis not present

## 2014-11-07 DIAGNOSIS — L570 Actinic keratosis: Secondary | ICD-10-CM | POA: Diagnosis not present

## 2014-11-19 ENCOUNTER — Emergency Department (INDEPENDENT_AMBULATORY_CARE_PROVIDER_SITE_OTHER)
Admission: EM | Admit: 2014-11-19 | Discharge: 2014-11-19 | Disposition: A | Discharge status: Other Acute Inpatient Hospital | Payer: Medicare Other | Source: Home / Self Care | Attending: Emergency Medicine | Admitting: Emergency Medicine

## 2014-11-19 ENCOUNTER — Emergency Department (HOSPITAL_COMMUNITY): Payer: Medicare Other

## 2014-11-19 ENCOUNTER — Telehealth: Payer: Self-pay | Admitting: Internal Medicine

## 2014-11-19 ENCOUNTER — Encounter (HOSPITAL_COMMUNITY): Payer: Self-pay | Admitting: Emergency Medicine

## 2014-11-19 ENCOUNTER — Inpatient Hospital Stay (HOSPITAL_COMMUNITY)
Admission: EM | Admit: 2014-11-19 | Discharge: 2014-11-23 | DRG: 422 | Disposition: A | Discharge status: Home / Self Care | Payer: Medicare Other | Attending: Internal Medicine | Admitting: Internal Medicine

## 2014-11-19 DIAGNOSIS — R1013 Epigastric pain: Secondary | ICD-10-CM

## 2014-11-19 DIAGNOSIS — I129 Hypertensive chronic kidney disease with stage 1 through stage 4 chronic kidney disease, or unspecified chronic kidney disease: Secondary | ICD-10-CM | POA: Diagnosis not present

## 2014-11-19 DIAGNOSIS — N183 Chronic kidney disease, stage 3 (moderate): Secondary | ICD-10-CM | POA: Diagnosis present

## 2014-11-19 DIAGNOSIS — K8051 Calculus of bile duct without cholangitis or cholecystitis with obstruction: Secondary | ICD-10-CM | POA: Diagnosis not present

## 2014-11-19 DIAGNOSIS — K2970 Gastritis, unspecified, without bleeding: Secondary | ICD-10-CM | POA: Diagnosis not present

## 2014-11-19 DIAGNOSIS — K831 Obstruction of bile duct: Secondary | ICD-10-CM

## 2014-11-19 DIAGNOSIS — R7401 Elevation of levels of liver transaminase levels: Secondary | ICD-10-CM

## 2014-11-19 DIAGNOSIS — R109 Unspecified abdominal pain: Secondary | ICD-10-CM | POA: Diagnosis present

## 2014-11-19 DIAGNOSIS — R932 Abnormal findings on diagnostic imaging of liver and biliary tract: Secondary | ICD-10-CM

## 2014-11-19 DIAGNOSIS — R10817 Generalized abdominal tenderness: Secondary | ICD-10-CM | POA: Diagnosis not present

## 2014-11-19 DIAGNOSIS — J8410 Pulmonary fibrosis, unspecified: Secondary | ICD-10-CM | POA: Diagnosis not present

## 2014-11-19 DIAGNOSIS — E039 Hypothyroidism, unspecified: Secondary | ICD-10-CM | POA: Diagnosis present

## 2014-11-19 DIAGNOSIS — Z9889 Other specified postprocedural states: Secondary | ICD-10-CM | POA: Diagnosis not present

## 2014-11-19 DIAGNOSIS — Z87891 Personal history of nicotine dependence: Secondary | ICD-10-CM | POA: Diagnosis not present

## 2014-11-19 DIAGNOSIS — K5710 Diverticulosis of small intestine without perforation or abscess without bleeding: Secondary | ICD-10-CM | POA: Diagnosis present

## 2014-11-19 DIAGNOSIS — K7689 Other specified diseases of liver: Secondary | ICD-10-CM | POA: Diagnosis not present

## 2014-11-19 DIAGNOSIS — R740 Nonspecific elevation of levels of transaminase and lactic acid dehydrogenase [LDH]: Secondary | ICD-10-CM

## 2014-11-19 LAB — COMPREHENSIVE METABOLIC PANEL
ALK PHOS: 165 U/L — AB (ref 39–117)
ALT: 1437 U/L — AB (ref 0–35)
AST: 2220 U/L — AB (ref 0–37)
Albumin: 4.1 g/dL (ref 3.5–5.2)
Anion gap: 13 (ref 5–15)
BILIRUBIN TOTAL: 1.7 mg/dL — AB (ref 0.3–1.2)
BUN: 16 mg/dL (ref 6–23)
CO2: 20 mmol/L (ref 19–32)
Calcium: 9.3 mg/dL (ref 8.4–10.5)
Chloride: 101 mmol/L (ref 96–112)
Creatinine, Ser: 1.2 mg/dL — ABNORMAL HIGH (ref 0.50–1.10)
GFR calc non Af Amer: 41 mL/min — ABNORMAL LOW (ref >90)
GFR, EST AFRICAN AMERICAN: 47 mL/min — AB (ref >90)
Glucose, Bld: 116 mg/dL — ABNORMAL HIGH (ref 70–99)
POTASSIUM: 4.3 mmol/L (ref 3.5–5.1)
Sodium: 134 mmol/L — ABNORMAL LOW (ref 135–145)
Total Protein: 6.8 g/dL (ref 6.0–8.3)

## 2014-11-19 LAB — CBC WITH DIFFERENTIAL/PLATELET
Basophils Absolute: 0 10*3/uL (ref 0.0–0.1)
Basophils Relative: 1 % (ref 0–1)
EOS ABS: 0 10*3/uL (ref 0.0–0.7)
Eosinophils Relative: 0 % (ref 0–5)
HCT: 33.3 % — ABNORMAL LOW (ref 36.0–46.0)
Hemoglobin: 11.1 g/dL — ABNORMAL LOW (ref 12.0–15.0)
Lymphocytes Relative: 14 % (ref 12–46)
Lymphs Abs: 0.7 10*3/uL (ref 0.7–4.0)
MCH: 29.8 pg (ref 26.0–34.0)
MCHC: 33.3 g/dL (ref 30.0–36.0)
MCV: 89.3 fL (ref 78.0–100.0)
Monocytes Absolute: 0.6 10*3/uL (ref 0.1–1.0)
Monocytes Relative: 12 % (ref 3–12)
NEUTROS ABS: 3.6 10*3/uL (ref 1.7–7.7)
Neutrophils Relative %: 73 % (ref 43–77)
PLATELETS: 261 10*3/uL (ref 150–400)
RBC: 3.73 MIL/uL — ABNORMAL LOW (ref 3.87–5.11)
RDW: 13.1 % (ref 11.5–15.5)
WBC: 4.9 10*3/uL (ref 4.0–10.5)

## 2014-11-19 LAB — I-STAT TROPONIN, ED: Troponin i, poc: 0 ng/mL (ref 0.00–0.08)

## 2014-11-19 LAB — URINALYSIS, ROUTINE W REFLEX MICROSCOPIC
BILIRUBIN URINE: NEGATIVE
Glucose, UA: NEGATIVE mg/dL
Hgb urine dipstick: NEGATIVE
Ketones, ur: NEGATIVE mg/dL
Nitrite: NEGATIVE
Protein, ur: NEGATIVE mg/dL
Specific Gravity, Urine: 1.014 (ref 1.005–1.030)
UROBILINOGEN UA: 1 mg/dL (ref 0.0–1.0)
pH: 6.5 (ref 5.0–8.0)

## 2014-11-19 LAB — URINE MICROSCOPIC-ADD ON

## 2014-11-19 LAB — I-STAT CG4 LACTIC ACID, ED: Lactic Acid, Venous: 1.12 mmol/L (ref 0.5–2.0)

## 2014-11-19 MED ORDER — MORPHINE SULFATE 4 MG/ML IJ SOLN
4.0000 mg | Freq: Once | INTRAMUSCULAR | Status: AC
  Administered 2014-11-19: 4 mg via INTRAVENOUS
  Filled 2014-11-19: qty 1

## 2014-11-19 MED ORDER — SODIUM CHLORIDE 0.9 % IV SOLN
Freq: Once | INTRAVENOUS | Status: AC
  Administered 2014-11-19: 19:00:00 via INTRAVENOUS

## 2014-11-19 MED ORDER — IOHEXOL 300 MG/ML  SOLN
25.0000 mL | Freq: Once | INTRAMUSCULAR | Status: AC | PRN
  Administered 2014-11-19: 25 mL via ORAL

## 2014-11-19 MED ORDER — IOHEXOL 300 MG/ML  SOLN
80.0000 mL | Freq: Once | INTRAMUSCULAR | Status: AC | PRN
  Administered 2014-11-19: 80 mL via INTRAVENOUS

## 2014-11-19 NOTE — ED Provider Notes (Signed)
[name] is a 81 y.o. female    Face-to-face evaluation   History: She presents for evaluation of upper abdominal pain which is worse in usual, lasting longer than usual since 11/03/2014. The pain comes and goes. She relates the pain as being from her hiatal hernia. She uses tramadol for chronic back pain. She takes generic Nexium, and uses Maalox for exacerbations. She is also on chlordiazepoxide and clidinium for abdominal discomfort. She has a follow-up appointment with her gastroenterologist on 11/30/2014.   Physical exam: Alert, elderly female in mild discomfort. She is somewhat dysarthric from morphine, which was given. Heart regular rhythm, no murmur. Lungs clear to auscultation. Abdomen normal bowel sounds, soft, mild midepigastric tenderness. No rebound tenderness.  Medical screening examination/treatment/procedure(s) were conducted as a shared visit with non-physician practitioner(s) and myself.  I personally evaluated the patient during the encounter  Daleen Bo, MD 11/20/14 [phone]

## 2014-11-19 NOTE — ED Notes (Signed)
Pt. Stated. I have a hiatal hernia and usually it will stop hurting, but today I can't get it to stop hurting.  Pt. Stated it was an 8. Goes to the back.

## 2014-11-19 NOTE — ED Notes (Signed)
Pt is from [facility]. Pt went for pain medication for treatment of her chronic abdominal pain, but was transferred here. Pt reports epigastric pain that radiates around her sides, like a band. Pt has hx of hernia, and her pain today is worse than normal, and her home medications are not working. Pt denies chest pain, NVD, or SOB. EKG was unremarkable.

## 2014-11-19 NOTE — Telephone Encounter (Signed)
[facility] - Client [place] [facility]  Patient Name: [name]  DOB: 03-24-1929    Initial Comment Caller states having hiatal hernia attack. Meds not helping.    Nurse Assessment  Nurse: Wynetta Emery, RN, Baker Janus Date/Time Eilene Ghazi Time): 11/19/2014 4:51:32 PM  Confirm and document reason for call. If symptomatic, describe symptoms. ---[name] is having burning, pain and in breast area and radiating in to her back; has Haitial hernia onset for several weeks  Has the patient traveled out of the country within the last 30 days? ---No  Does the patient require triage? ---Yes  Related visit to physician within the last 2 weeks? ---No  Does the PT have any chronic conditions? (i.e. diabetes, asthma, etc.) ---No     Guidelines    Guideline Title Affirmed Question Affirmed Notes  Chest Pain SEVERE chest pain in chest breast area bearing down pain under breasts through to back.   Final Disposition User   Go to ED Now Wynetta Emery, RN, Baker Janus    Comments  Does not want to go to ED said she will go to the Urgent care --

## 2014-11-19 NOTE — ED Provider Notes (Signed)
CSN: [id number]     Arrival date & time 11/19/14  1936 History   First MD Initiated Contact with Patient 11/19/14 1949     Chief Complaint  Patient presents with  . Abdominal Pain     (Consider location/radiation/quality/duration/timing/severity/associated sxs/prior Treatment) HPI [name] is an 81 year old female with past medical history of hiatal hernia, hypothyroidism, hypertension who presents the ER complaining of abdominal pain. Patient reports onset of abdominal pain beginning at 11:00 AM this morning. Patient states the past month she has been experiencing worsening of her pain associated with her hiatal hernia. Patient states her pain today feels different than usual. Patient states her pain is across her entire abdomen, and she feels it radiates to her back. Patient's denies having any aggravating or alleviating factors. Patient denies associated nausea, vomiting, chest pain, diarrhea, dysuria. Patient denies constipation, hematochezia, melena.   Past Medical History  Diagnosis Date  . HYPOTHYROIDISM 04/04/2007  . HYPERTENSION 04/04/2007  . Chronic fatigue   . Depression   . Anemia   . Chronic renal insufficiency, stage III (moderate)    Past Surgical History  Procedure Laterality Date  . Appendectomy    . Cholecystectomy    . Hernia repair    . Abdominal hysterectomy    . Spine surgery  2011    cervical  . Oophorectomy    . Back surgery  2012  . Orif tibia fracture  2012    dr duda   Family History  Problem Relation Age of Onset  . Heart disease Mother   . Cancer Mother 77    renal cell CA  . Lymphoma Son   . Heart disease Father    History  Substance Use Topics  . Smoking status: Former Smoker -- 1.00 packs/day for 7 years    Types: Cigarettes    Quit date: 10/13/1969  . Smokeless tobacco: Not on file  . Alcohol Use: No   OB History    No data available     Review of Systems  Constitutional: Negative for fever.  HENT: Negative for trouble  swallowing.   Eyes: Negative for visual disturbance.  Respiratory: Negative for shortness of breath.   Cardiovascular: Negative for chest pain.  Gastrointestinal: Positive for abdominal pain. Negative for nausea and vomiting.  Genitourinary: Negative for dysuria.  Musculoskeletal: Negative for neck pain.  Skin: Negative for rash.  Neurological: Negative for dizziness, weakness and numbness.  Psychiatric/Behavioral: Negative.       Allergies  Oxycodone hcl  Home Medications   Prior to Admission medications   Medication Sig Start Date End Date Taking? Authorizing Provider  amLODipine (NORVASC) 5 MG tablet Take 1.5 tablets (7.5 mg total) by mouth daily. 07/25/14  Yes Doe-Hyun R Shawna Orleans, DO  cetirizine (ZYRTEC) 10 MG tablet Take 10 mg by mouth daily.   Yes Historical Provider, MD  clidinium-chlordiazePOXIDE (LIBRAX) 2.5-5 MG per capsule 2 (two) times daily as needed.  02/26/11  Yes Historical Provider, MD  fluticasone (FLONASE) 50 MCG/ACT nasal spray Place 2 sprays into both nostrils daily. 06/23/13  Yes Doe-Hyun Kyra Searles, DO  levothyroxine (SYNTHROID, LEVOTHROID) 137 MCG tablet TAKE 1 TABLET DAILY BEFORE BREAKFAST 12/29/13  Yes Doe-Hyun Kyra Searles, DO  Multiple Vitamin (MULTIVITAMIN) tablet Take 1 tablet by mouth daily.   Yes Historical Provider, MD  olmesartan (BENICAR) 40 MG tablet Take 0.5 tablets (20 mg total) by mouth daily. 07/04/14  Yes Doe-Hyun R Shawna Orleans, DO  omeprazole (PRILOSEC) 40 MG capsule Take 1 tablet by mouth daily  before breakfast.  05/09/12  Yes Historical Provider, MD  potassium chloride SA (K-DUR,KLOR-CON) 20 MEQ tablet Take 1 tablet by mouth daily. 08/13/14  Yes Historical Provider, MD  sodium chloride (OCEAN) 0.65 % SOLN nasal spray Place 1 spray into both nostrils as needed for congestion.   Yes Historical Provider, MD  traMADol (ULTRAM) 50 MG tablet Take 0.5 tablets (25 mg total) by mouth every 12 (twelve) hours as needed (Do not take near bedtime). 05/09/14  Yes Doe-Hyun R Shawna Orleans, DO   zolpidem (AMBIEN) 5 MG tablet Take 1 tablet (5 mg total) by mouth at bedtime as needed for sleep. 05/09/14  Yes Doe-Hyun R Shawna Orleans, DO   BP 135/56 mmHg  Pulse 66  Temp(Src) 98.7 F (37.1 C) (Oral)  Resp 16  Ht 5\' 2"  (1.575 m)  Wt 167 lb (75.751 kg)  BMI 30.54 kg/m2  SpO2 96% Physical Exam  Constitutional: She is oriented to person, place, and time. She appears well-developed and well-nourished. No distress.  HENT:  Head: Normocephalic and atraumatic.  Mouth/Throat: Oropharynx is clear and moist. No oropharyngeal exudate.  Eyes: EOM are normal. Pupils are equal, round, and reactive to light. Right eye exhibits no discharge. Left eye exhibits no discharge. No scleral icterus.  Neck: Normal range of motion.  Cardiovascular: Normal rate, regular rhythm and normal heart sounds.   No murmur heard. Pulmonary/Chest: Effort normal and breath sounds normal. No respiratory distress.  Abdominal: Soft. Normal appearance and bowel sounds are normal. There is tenderness in the epigastric area and periumbilical area. There is no rigidity, no guarding, no tenderness at McBurney's point and negative Murphy's sign.  Musculoskeletal: Normal range of motion. She exhibits no edema or tenderness.  Neurological: She is alert and oriented to person, place, and time. No cranial nerve deficit. Coordination normal.  Skin: Skin is warm and dry. No rash noted. She is not diaphoretic.  Psychiatric: She has a normal mood and affect.  Nursing note and vitals reviewed.   ED Course  Procedures (including critical care time) Labs Review Labs Reviewed  CBC WITH DIFFERENTIAL/PLATELET - Abnormal; Notable for the following:    RBC 3.73 (*)    Hemoglobin 11.1 (*)    HCT 33.3 (*)    All other components within normal limits  URINALYSIS, ROUTINE W REFLEX MICROSCOPIC - Abnormal; Notable for the following:    Leukocytes, UA TRACE (*)    All other components within normal limits  COMPREHENSIVE METABOLIC PANEL - Abnormal;  Notable for the following:    Sodium 134 (*)    Glucose, Bld 116 (*)    Creatinine, Ser 1.20 (*)    AST 2220 (*)    ALT 1437 (*)    Alkaline Phosphatase 165 (*)    Total Bilirubin 1.7 (*)    GFR calc non Af Amer 41 (*)    GFR calc Af Amer 47 (*)    All other components within normal limits  URINE MICROSCOPIC-ADD ON  LIPASE, BLOOD  I-STAT TROPOININ, ED  I-STAT CG4 LACTIC ACID, ED    Imaging Review Dg Chest 2 View  11/19/2014   CLINICAL DATA:  Epigastric pain.  EXAM: CHEST  2 VIEW  COMPARISON:  10/09/2014  FINDINGS: The cardiomediastinal contours are unchanged, heart remains at the upper limits of normal, atherosclerosis noted of the thoracic aorta. Unchanged calcified granuloma in the right upper lung. Pulmonary vasculature is normal. No consolidation, pleural effusion, or pneumothorax. No acute osseous abnormalities are seen.  IMPRESSION: No acute pulmonary process.  Electronically Signed   By: Jeb Levering M.D.   On: 11/19/2014 21:58   Ct Abdomen Pelvis W Contrast  11/20/2014   CLINICAL DATA:  Severe epigastric pain starting today at 11.  EXAM: CT ABDOMEN AND PELVIS WITH CONTRAST  TECHNIQUE: Multidetector CT imaging of the abdomen and pelvis was performed using the standard protocol following bolus administration of intravenous contrast.  CONTRAST:  78mL OMNIPAQUE IOHEXOL 300 MG/ML  SOLN  COMPARISON:  None.  FINDINGS: BODY WALL: No contributory findings.  LOWER CHEST: Extensive coronary atherosclerosis.  ABDOMEN/PELVIS:  Liver: No focal abnormality.  Biliary: Cholecystectomy. There is intra and extrahepatic biliary dilatation with the common bile duct measuring up to 16 mm. The duct is smoothly tapering and there is no calcified choledocholithiasis.  Pancreas: Unremarkable.  Spleen: Unremarkable.  Adrenals: Unremarkable.  Kidneys and ureters: No hydronephrosis or stone.  Bladder: Unremarkable.  Reproductive: Hysterectomy and probable oophorectomies. Pelvic floor laxity.  Bowel: Widespread  colonic diverticulosis. No bowel obstruction or wall thickening. Large but uncomplicated mid duodenal diverticulum. No indication of appendicitis.  Retroperitoneum: No mass or adenopathy.  Peritoneum: No ascites or pneumoperitoneum.  Vascular: No acute abnormality. Densely calcified 14 mm splenic artery aneurysm.  OSSEOUS: No acute abnormalities.  IMPRESSION: Intra and extrahepatic biliary dilatation. Although post cholecystectomy, findings are concerning for obstruction given the liver function tests. There is no visible mass or choledocholithiasis.   Electronically Signed   By: Monte Fantasia M.D.   On: 11/20/2014 01:02     EKG Interpretation   Date/Time:  Monday November 19 2014 20:56:36 EDT Ventricular Rate:  72 PR Interval:  194 QRS Duration: 101 QT Interval:  400 QTC Calculation: 438 R Axis:   -14 Text Interpretation:  Sinus rhythm since last tracing no significant  change Confirmed by Eulis Foster  MD, ELLIOTT [phone]) on 11/19/2014 11:49:59 PM      MDM   Final diagnoses:  Epigastric pain    Patient here with one day of upper abdominal pain, diffuse upper abdominal tenderness noted on exam, concern for surgical abdomen. Will follow-up with CT abdomen pelvis. Patient noted to have elevated liver enzymes at AST 2200, ALT 1400.  CT abdomen pelvis with impression: Intra and extrahepatic biliary dilatation. Although post cholecystectomy, findings are concerning for obstruction given the liver function tests. There is no visible mass or Choledocholithiasis.  Patient's pain difficult to control here. I spoke with Dr. Collene Mares with gastroenterology in consultation of patient's case, who recommends patient be admitted for further workup with ERCP/MRCP. Patient admitted to medicine under Dr. Ernestina Patches. The patient appears reasonably stabilized for admission considering the current resources, flow, and capabilities available in the ED at this time, and I doubt any other [facility] requiring further screening  and/or treatment in the ED prior to admission.  BP 135/56 mmHg  Pulse 66  Temp(Src) 98.7 F (37.1 C) (Oral)  Resp 16  Ht 5\' 2"  (1.575 m)  Wt 167 lb (75.751 kg)  BMI 30.54 kg/m2  SpO2 96%  Signed,  Dahlia Bailiff, PA-C 1:45 AM  Patient seen and discussed with Dr. Christ Kick, MD   Dahlia Bailiff, PA-C 11/20/14 0145  Daleen Bo, MD 11/20/14 [phone]

## 2014-11-19 NOTE — ED Notes (Signed)
[facility] does not have a truck available , [facility] called and will send truck ASAP. CN in main ED advised of pending transfer

## 2014-11-19 NOTE — ED Provider Notes (Signed)
CSN: [id number]     Arrival date & time 11/19/14  1812 History   First MD Initiated Contact with Patient 11/19/14 1849     Chief Complaint  Patient presents with  . Chest Pain   (Consider location/radiation/quality/duration/timing/severity/associated sxs/prior Treatment) HPI Comments: Patient is an 81 yo caucasian female who presents with acute upper abdominal pain since this am. She carries a history of a hiatal hernia and feels this may be the issue. However it is an "8" and is not relieved with Mylanta and use of Prilosec. She feels SOB with the pain. At times it radiates to upper back in a "ring". "Stabbing" and "burning" at times. No N, V or diarrhea. Full BM this am. No chest pain. Change of position does not affect. Has not eaten since this am. No preceding illness. No cardiac history. Noted HTN.   Patient is a 81 y.o. female presenting with chest pain. The history is provided by the patient.  Chest Pain Associated symptoms: abdominal pain and back pain   Associated symptoms: no nausea and not vomiting     Past Medical History  Diagnosis Date  . HYPOTHYROIDISM 04/04/2007  . HYPERTENSION 04/04/2007  . Chronic fatigue   . Depression   . Anemia   . Chronic renal insufficiency, stage III (moderate)    Past Surgical History  Procedure Laterality Date  . Appendectomy    . Cholecystectomy    . Hernia repair    . Abdominal hysterectomy    . Spine surgery  2011    cervical  . Oophorectomy    . Back surgery  2012  . Orif tibia fracture  2012    dr duda   Family History  Problem Relation Age of Onset  . Heart disease Mother   . Cancer Mother 35    renal cell CA  . Lymphoma Son   . Heart disease Father    History  Substance Use Topics  . Smoking status: Former Smoker -- 1.00 packs/day for 7 years    Types: Cigarettes    Quit date: 10/13/1969  . Smokeless tobacco: Not on file  . Alcohol Use: No   OB History    No data available     Review of Systems  Cardiovascular:  Positive for chest pain.  Gastrointestinal: Positive for abdominal pain. Negative for nausea, vomiting, diarrhea, constipation and blood in stool.  Genitourinary: Negative for dysuria, flank pain and pelvic pain.  Musculoskeletal: Positive for back pain.  Psychiatric/Behavioral: Negative.     Allergies  Oxycodone hcl  Home Medications   Prior to Admission medications   Medication Sig Start Date End Date Taking? Authorizing Provider  amLODipine (NORVASC) 5 MG tablet Take 1.5 tablets (7.5 mg total) by mouth daily. 07/25/14   Doe-Hyun R Shawna Orleans, DO  cetirizine (ZYRTEC) 10 MG tablet Take 10 mg by mouth daily.    Historical Provider, MD  clidinium-chlordiazePOXIDE (LIBRAX) 2.5-5 MG per capsule 2 (two) times daily as needed.  02/26/11   Historical Provider, MD  fluticasone (FLONASE) 50 MCG/ACT nasal spray Place 2 sprays into both nostrils daily. 06/23/13   Doe-Hyun Kyra Searles, DO  levothyroxine (SYNTHROID, LEVOTHROID) 137 MCG tablet TAKE 1 TABLET DAILY BEFORE BREAKFAST 12/29/13   Doe-Hyun Kyra Searles, DO  Multiple Vitamin (MULTIVITAMIN) tablet Take 1 tablet by mouth daily.    Historical Provider, MD  olmesartan (BENICAR) 40 MG tablet Take 0.5 tablets (20 mg total) by mouth daily. 07/04/14   Doe-Hyun R Shawna Orleans, DO  omeprazole (PRILOSEC) 40 MG  capsule Take 1 tablet by mouth daily before breakfast.  05/09/12   Historical Provider, MD  potassium chloride SA (K-DUR,KLOR-CON) 20 MEQ tablet Take 1 tablet by mouth daily. 08/13/14   Historical Provider, MD  sodium chloride (OCEAN) 0.65 % SOLN nasal spray Place 1 spray into both nostrils as needed for congestion.    Historical Provider, MD  traMADol (ULTRAM) 50 MG tablet Take 0.5 tablets (25 mg total) by mouth every 12 (twelve) hours as needed (Do not take near bedtime). 05/09/14   Doe-Hyun R Shawna Orleans, DO  zolpidem (AMBIEN) 5 MG tablet Take 1 tablet (5 mg total) by mouth at bedtime as needed for sleep. 05/09/14   Doe-Hyun R Shawna Orleans, DO   BP 170/55 mmHg  Pulse 71  Resp 20  SpO2  98% Physical Exam  Constitutional: She is oriented to person, place, and time. She appears well-developed and well-nourished. She appears distressed.  Cannot lie still; holding her upper abdomen moaning in pain. Non-toxic appearing.   Cardiovascular: Normal rate and regular rhythm.   Pulmonary/Chest: Effort normal and breath sounds normal.  Abdominal: There is tenderness. There is rebound and guarding.  Mildly decreased bowel sounds. Limited exam secondary to pain. Firm upper abdomen with pain to palpation, grimacing and rebound.   Neurological: She is alert and oriented to person, place, and time.  Skin: Skin is warm and dry.  Psychiatric: Her behavior is normal.  Nursing note and vitals reviewed.   ED Course  EKG  Date/Time: 11/19/2014 6:45 PM Performed by: Bjorn Pippin Authorized by: Melony Overly Interpreted by ED physician Comparison: not compared with previous ECG  Rhythm: sinus rhythm Comments: Normal Sinus Rhythm   (including critical care time) Labs Review Labs Reviewed - No data to display  Imaging Review No results found.   MDM   1. Epigastric pain    Acute upper abdominal pain; abdominal exam is concerning for acute abdomen with decreased bowel sounds, rebound and guarding. EKG normal (as above). Transfer to the ER for further work up.      Bjorn Pippin, PA-C 11/19/14 Einar Crow

## 2014-11-20 ENCOUNTER — Inpatient Hospital Stay (HOSPITAL_COMMUNITY): Payer: Medicare Other

## 2014-11-20 ENCOUNTER — Encounter (HOSPITAL_COMMUNITY): Payer: Self-pay | Admitting: Gastroenterology

## 2014-11-20 ENCOUNTER — Encounter: Payer: Self-pay | Admitting: Internal Medicine

## 2014-11-20 DIAGNOSIS — R109 Unspecified abdominal pain: Secondary | ICD-10-CM | POA: Diagnosis present

## 2014-11-20 DIAGNOSIS — R7401 Elevation of levels of liver transaminase levels: Secondary | ICD-10-CM

## 2014-11-20 DIAGNOSIS — N183 Chronic kidney disease, stage 3 (moderate): Secondary | ICD-10-CM | POA: Diagnosis present

## 2014-11-20 DIAGNOSIS — Z9889 Other specified postprocedural states: Secondary | ICD-10-CM | POA: Diagnosis not present

## 2014-11-20 DIAGNOSIS — E039 Hypothyroidism, unspecified: Secondary | ICD-10-CM | POA: Diagnosis present

## 2014-11-20 DIAGNOSIS — I129 Hypertensive chronic kidney disease with stage 1 through stage 4 chronic kidney disease, or unspecified chronic kidney disease: Secondary | ICD-10-CM | POA: Diagnosis present

## 2014-11-20 DIAGNOSIS — R1084 Generalized abdominal pain: Secondary | ICD-10-CM | POA: Diagnosis not present

## 2014-11-20 DIAGNOSIS — K5710 Diverticulosis of small intestine without perforation or abscess without bleeding: Secondary | ICD-10-CM | POA: Diagnosis present

## 2014-11-20 DIAGNOSIS — K7689 Other specified diseases of liver: Secondary | ICD-10-CM | POA: Diagnosis not present

## 2014-11-20 DIAGNOSIS — R740 Nonspecific elevation of levels of transaminase and lactic acid dehydrogenase [LDH]: Secondary | ICD-10-CM

## 2014-11-20 DIAGNOSIS — R1013 Epigastric pain: Secondary | ICD-10-CM

## 2014-11-20 DIAGNOSIS — Z87891 Personal history of nicotine dependence: Secondary | ICD-10-CM | POA: Diagnosis not present

## 2014-11-20 DIAGNOSIS — H269 Unspecified cataract: Secondary | ICD-10-CM | POA: Diagnosis not present

## 2014-11-20 DIAGNOSIS — K838 Other specified diseases of biliary tract: Secondary | ICD-10-CM | POA: Diagnosis not present

## 2014-11-20 DIAGNOSIS — K8050 Calculus of bile duct without cholangitis or cholecystitis without obstruction: Secondary | ICD-10-CM | POA: Diagnosis not present

## 2014-11-20 DIAGNOSIS — R932 Abnormal findings on diagnostic imaging of liver and biliary tract: Secondary | ICD-10-CM | POA: Diagnosis not present

## 2014-11-20 DIAGNOSIS — K8051 Calculus of bile duct without cholangitis or cholecystitis with obstruction: Secondary | ICD-10-CM | POA: Diagnosis present

## 2014-11-20 LAB — COMPREHENSIVE METABOLIC PANEL
ALT: 1263 U/L — AB (ref 0–35)
AST: 1136 U/L — ABNORMAL HIGH (ref 0–37)
Albumin: 3.4 g/dL — ABNORMAL LOW (ref 3.5–5.2)
Alkaline Phosphatase: 164 U/L — ABNORMAL HIGH (ref 39–117)
Anion gap: 6 (ref 5–15)
BUN: 14 mg/dL (ref 6–23)
CALCIUM: 9 mg/dL (ref 8.4–10.5)
CO2: 27 mmol/L (ref 19–32)
CREATININE: 1.16 mg/dL — AB (ref 0.50–1.10)
Chloride: 98 mmol/L (ref 96–112)
GFR calc Af Amer: 49 mL/min — ABNORMAL LOW (ref >90)
GFR, EST NON AFRICAN AMERICAN: 42 mL/min — AB (ref >90)
Glucose, Bld: 112 mg/dL — ABNORMAL HIGH (ref 70–99)
POTASSIUM: 4.5 mmol/L (ref 3.5–5.1)
SODIUM: 131 mmol/L — AB (ref 135–145)
TOTAL PROTEIN: 6.1 g/dL (ref 6.0–8.3)
Total Bilirubin: 2.5 mg/dL — ABNORMAL HIGH (ref 0.3–1.2)

## 2014-11-20 LAB — CBC WITH DIFFERENTIAL/PLATELET
Basophils Absolute: 0 10*3/uL (ref 0.0–0.1)
Basophils Relative: 1 % (ref 0–1)
EOS PCT: 1 % (ref 0–5)
Eosinophils Absolute: 0 10*3/uL (ref 0.0–0.7)
HEMATOCRIT: 30.4 % — AB (ref 36.0–46.0)
HEMOGLOBIN: 10.1 g/dL — AB (ref 12.0–15.0)
LYMPHS PCT: 20 % (ref 12–46)
Lymphs Abs: 0.8 10*3/uL (ref 0.7–4.0)
MCH: 29.4 pg (ref 26.0–34.0)
MCHC: 33.2 g/dL (ref 30.0–36.0)
MCV: 88.4 fL (ref 78.0–100.0)
MONO ABS: 0.4 10*3/uL (ref 0.1–1.0)
MONOS PCT: 11 % (ref 3–12)
NEUTROS ABS: 2.7 10*3/uL (ref 1.7–7.7)
Neutrophils Relative %: 67 % (ref 43–77)
Platelets: 255 10*3/uL (ref 150–400)
RBC: 3.44 MIL/uL — AB (ref 3.87–5.11)
RDW: 13.2 % (ref 11.5–15.5)
WBC: 4 10*3/uL (ref 4.0–10.5)

## 2014-11-20 LAB — LACTIC ACID, PLASMA
LACTIC ACID, VENOUS: 0.7 mmol/L (ref 0.5–2.0)
Lactic Acid, Venous: 0.9 mmol/L (ref 0.5–2.0)

## 2014-11-20 LAB — LIPASE, BLOOD: LIPASE: 34 U/L (ref 11–59)

## 2014-11-20 MED ORDER — ZOLPIDEM TARTRATE 5 MG PO TABS: 5.0000 mg | ORAL_TABLET | Freq: Every evening | ORAL | Status: DC | PRN

## 2014-11-20 MED ORDER — LORAZEPAM 2 MG/ML IJ SOLN
1.0000 mg | INTRAMUSCULAR | Status: DC | PRN
  Administered 2014-11-20: 1 mg via INTRAVENOUS
  Filled 2014-11-20: qty 1

## 2014-11-20 MED ORDER — PANTOPRAZOLE SODIUM 40 MG PO TBEC: 40.0000 mg | DELAYED_RELEASE_TABLET | Freq: Every day | ORAL | Status: DC

## 2014-11-20 MED ORDER — AMLODIPINE BESYLATE 5 MG PO TABS
7.5000 mg | ORAL_TABLET | Freq: Every day | ORAL | Status: DC
  Administered 2014-11-20 – 2014-11-23 (×4): 7.5 mg via ORAL
  Filled 2014-11-20 (×4): qty 1

## 2014-11-20 MED ORDER — LORATADINE 10 MG PO TABS
10.0000 mg | ORAL_TABLET | Freq: Every day | ORAL | Status: DC
  Administered 2014-11-20 – 2014-11-23 (×4): 10 mg via ORAL
  Filled 2014-11-20 (×4): qty 1

## 2014-11-20 MED ORDER — TRAMADOL HCL 50 MG PO TABS
50.0000 mg | ORAL_TABLET | Freq: Four times a day (QID) | ORAL | Status: DC | PRN
  Administered 2014-11-20 – 2014-11-22 (×2): 50 mg via ORAL
  Filled 2014-11-20 (×2): qty 1

## 2014-11-20 MED ORDER — IRBESARTAN 300 MG PO TABS
300.0000 mg | ORAL_TABLET | Freq: Every day | ORAL | Status: DC
  Administered 2014-11-20 – 2014-11-23 (×4): 300 mg via ORAL
  Filled 2014-11-20 (×4): qty 1

## 2014-11-20 MED ORDER — SODIUM CHLORIDE 0.9 % IV SOLN
INTRAVENOUS | Status: DC
  Administered 2014-11-20 – 2014-11-22 (×4): via INTRAVENOUS
  Administered 2014-11-22: 500 mL via INTRAVENOUS

## 2014-11-20 MED ORDER — LEVOTHYROXINE SODIUM 137 MCG PO TABS
137.0000 ug | ORAL_TABLET | Freq: Every day | ORAL | Status: DC
  Administered 2014-11-20 – 2014-11-23 (×4): 137 ug via ORAL
  Filled 2014-11-20 (×5): qty 1

## 2014-11-20 MED ORDER — ADULT MULTIVITAMIN W/MINERALS CH
1.0000 | ORAL_TABLET | Freq: Every day | ORAL | Status: DC
  Administered 2014-11-20 – 2014-11-23 (×4): 1 via ORAL
  Filled 2014-11-20 (×4): qty 1

## 2014-11-20 MED ORDER — ONDANSETRON HCL 4 MG/2ML IJ SOLN
4.0000 mg | Freq: Once | INTRAMUSCULAR | Status: AC
Start: 2014-11-20 — End: 2014-11-20
  Administered 2014-11-20: 4 mg via INTRAVENOUS
  Filled 2014-11-20 (×2): qty 2

## 2014-11-20 MED ORDER — PANTOPRAZOLE SODIUM 40 MG IV SOLR
40.0000 mg | INTRAVENOUS | Status: DC
  Administered 2014-11-20 – 2014-11-23 (×4): 40 mg via INTRAVENOUS
  Filled 2014-11-20 (×4): qty 40

## 2014-11-20 MED ORDER — HEPARIN SODIUM (PORCINE) 5000 UNIT/ML IJ SOLN
5000.0000 [IU] | Freq: Three times a day (TID) | INTRAMUSCULAR | Status: DC
  Administered 2014-11-20 – 2014-11-23 (×8): 5000 [IU] via SUBCUTANEOUS
  Filled 2014-11-20 (×13): qty 1

## 2014-11-20 NOTE — Progress Notes (Addendum)
Pt seen and examined, admitted earlier this am per Dr.Newton 85/F HTN, stage 3 CKD, s/p cholecystectomy in 1980s admitted with Epigastric and upper abd pain, transaminitis and dilated CBD -Dilated CBD possible choledocholithiasis, ? Passed stone vs stricture -Keep NPO/ice chips -[facility] consult, followed by Dr.Magod -defer MRCP vs ERCP to GI -Cmet in am  Domenic Polite, MD [phone]

## 2014-11-20 NOTE — Progress Notes (Signed)
Spoke with Dr. Broadus John regarding the patient and the possible ERPC.  Orders received to hold meds until later today.  Jillyn Ledger, MBA, BS, RN

## 2014-11-20 NOTE — Consult Note (Signed)
Reason for Consult: Possible CBD stones Referring Physician: Hospital team  [name] is an 81 y.o. female.  HPI: Patient familiar to me from years of episodic GI problems and our office computer chart was reviewed as well as the hospital computer chart and the case was discussed with the patient and her son that she had been doing okay from a GI standpoint until she had some vague upper abdominal pains over the last month but then yesterday it got worse with radiation to her back and she called our office but only complained of her back sooner we asked her to see her primary care physician and she was admitted with increased liver tests and abnormal CAT scan and we are consulted for further workup and plans and she had her gallbladder out years ago and believes possibly she passed a stone a few years ago without any workup and she may have had some low-grade fever and dark urine for 2 days and her family history is negative for any GI problems and she's actually feeling a little bit better today with just some mild residual pain and no nausea or vomiting today  Past Medical History  Diagnosis Date  . HYPOTHYROIDISM 04/04/2007  . HYPERTENSION 04/04/2007  . Chronic fatigue   . Depression   . Anemia   . Chronic renal insufficiency, stage III (moderate)     Past Surgical History  Procedure Laterality Date  . Appendectomy    . Cholecystectomy    . Hernia repair    . Abdominal hysterectomy    . Spine surgery  2011    cervical  . Oophorectomy    . Back surgery  2012  . Orif tibia fracture  2012    dr duda    Family History  Problem Relation Age of Onset  . Heart disease Mother   . Cancer Mother 21    renal cell CA  . Lymphoma Son   . Heart disease Father     Social History:  reports that she quit smoking about 45 years ago. Her smoking use included Cigarettes. She has a 7 pack-year smoking history. She does not have any smokeless tobacco history on file. She reports that she does  not drink alcohol or use illicit drugs.  Allergies:  Allergies  Allergen Reactions  . Oxycodone Hcl     REACTION: itching    Medications: I have reviewed the patient's current medications.  Results for orders placed or performed during the hospital encounter of 11/19/14 (from the past 48 hour(s))  Urinalysis with microscopic     Status: Abnormal   Collection Time: 11/19/14  8:25 PM  Result Value Ref Range   Color, Urine YELLOW YELLOW   APPearance CLEAR CLEAR   Specific Gravity, Urine 1.014 1.005 - 1.030   pH 6.5 5.0 - 8.0   Glucose, UA NEGATIVE NEGATIVE mg/dL   Hgb urine dipstick NEGATIVE NEGATIVE   Bilirubin Urine NEGATIVE NEGATIVE   Ketones, ur NEGATIVE NEGATIVE mg/dL   Protein, ur NEGATIVE NEGATIVE mg/dL   Urobilinogen, UA 1.0 0.0 - 1.0 mg/dL   Nitrite NEGATIVE NEGATIVE   Leukocytes, UA TRACE (A) NEGATIVE  Urine microscopic-add on     Status: None   Collection Time: 11/19/14  8:25 PM  Result Value Ref Range   Squamous Epithelial / LPF RARE RARE   WBC, UA 0-2 <3 WBC/hpf   Bacteria, UA RARE RARE  CBC WITH DIFFERENTIAL     Status: Abnormal   Collection Time: 11/19/14  8:57 PM  Result Value Ref Range   WBC 4.9 4.0 - 10.5 K/uL   RBC 3.73 (L) 3.87 - 5.11 MIL/uL   Hemoglobin 11.1 (L) 12.0 - 15.0 g/dL   HCT 33.3 (L) 36.0 - 46.0 %   MCV 89.3 78.0 - 100.0 fL   MCH 29.8 26.0 - 34.0 pg   MCHC 33.3 30.0 - 36.0 g/dL   RDW 13.1 11.5 - 15.5 %   Platelets 261 150 - 400 K/uL   Neutrophils Relative % 73 43 - 77 %   Neutro Abs 3.6 1.7 - 7.7 K/uL   Lymphocytes Relative 14 12 - 46 %   Lymphs Abs 0.7 0.7 - 4.0 K/uL   Monocytes Relative 12 3 - 12 %   Monocytes Absolute 0.6 0.1 - 1.0 K/uL   Eosinophils Relative 0 0 - 5 %   Eosinophils Absolute 0.0 0.0 - 0.7 K/uL   Basophils Relative 1 0 - 1 %   Basophils Absolute 0.0 0.0 - 0.1 K/uL  Comprehensive metabolic panel     Status: Abnormal   Collection Time: 11/19/14  8:57 PM  Result Value Ref Range   Sodium 134 (L) 135 - 145 mmol/L    Potassium 4.3 3.5 - 5.1 mmol/L   Chloride 101 96 - 112 mmol/L   CO2 20 19 - 32 mmol/L   Glucose, Bld 116 (H) 70 - 99 mg/dL   BUN 16 6 - 23 mg/dL   Creatinine, Ser 1.20 (H) 0.50 - 1.10 mg/dL   Calcium 9.3 8.4 - 10.5 mg/dL   Total Protein 6.8 6.0 - 8.3 g/dL   Albumin 4.1 3.5 - 5.2 g/dL   AST 2220 (H) 0 - 37 U/L   ALT 1437 (H) 0 - 35 U/L   Alkaline Phosphatase 165 (H) 39 - 117 U/L   Total Bilirubin 1.7 (H) 0.3 - 1.2 mg/dL   GFR calc non Af Amer 41 (L) >90 mL/min   GFR calc Af Amer 47 (L) >90 mL/min    Comment: (NOTE) The eGFR has been calculated using the CKD EPI equation. This calculation has not been validated in all clinical situations. eGFR's persistently <90 mL/min signify possible Chronic Kidney Disease.    Anion gap 13 5 - 15  Lipase, blood     Status: None   Collection Time: 11/19/14  8:59 PM  Result Value Ref Range   Lipase 34 11 - 59 U/L  I-stat troponin, ED     Status: None   Collection Time: 11/19/14  9:05 PM  Result Value Ref Range   Troponin i, poc 0.00 0.00 - 0.08 ng/mL   Comment 3            Comment: Due to the release kinetics of cTnI, a negative result within the first hours of the onset of symptoms does not rule out myocardial infarction with certainty. If myocardial infarction is still suspected, repeat the test at appropriate intervals.   I-Stat CG4 Lactic Acid, ED     Status: None   Collection Time: 11/19/14  9:07 PM  Result Value Ref Range   Lactic Acid, Venous 1.12 0.5 - 2.0 mmol/L  Lactic acid, plasma     Status: None   Collection Time: 11/20/14  3:20 AM  Result Value Ref Range   Lactic Acid, Venous 0.9 0.5 - 2.0 mmol/L  Comprehensive metabolic panel     Status: Abnormal   Collection Time: 11/20/14  6:00 AM  Result Value Ref Range   Sodium 131 (L)  135 - 145 mmol/L   Potassium 4.5 3.5 - 5.1 mmol/L   Chloride 98 96 - 112 mmol/L   CO2 27 19 - 32 mmol/L   Glucose, Bld 112 (H) 70 - 99 mg/dL   BUN 14 6 - 23 mg/dL   Creatinine, Ser 1.16 (H) 0.50 -  1.10 mg/dL   Calcium 9.0 8.4 - 10.5 mg/dL   Total Protein 6.1 6.0 - 8.3 g/dL   Albumin 3.4 (L) 3.5 - 5.2 g/dL   AST 1136 (H) 0 - 37 U/L   ALT 1263 (H) 0 - 35 U/L   Alkaline Phosphatase 164 (H) 39 - 117 U/L   Total Bilirubin 2.5 (H) 0.3 - 1.2 mg/dL   GFR calc non Af Amer 42 (L) >90 mL/min   GFR calc Af Amer 49 (L) >90 mL/min    Comment: (NOTE) The eGFR has been calculated using the CKD EPI equation. This calculation has not been validated in all clinical situations. eGFR's persistently <90 mL/min signify possible Chronic Kidney Disease.    Anion gap 6 5 - 15  CBC WITH DIFFERENTIAL     Status: Abnormal   Collection Time: 11/20/14  6:00 AM  Result Value Ref Range   WBC 4.0 4.0 - 10.5 K/uL   RBC 3.44 (L) 3.87 - 5.11 MIL/uL   Hemoglobin 10.1 (L) 12.0 - 15.0 g/dL   HCT 30.4 (L) 36.0 - 46.0 %   MCV 88.4 78.0 - 100.0 fL   MCH 29.4 26.0 - 34.0 pg   MCHC 33.2 30.0 - 36.0 g/dL   RDW 13.2 11.5 - 15.5 %   Platelets 255 150 - 400 K/uL   Neutrophils Relative % 67 43 - 77 %   Neutro Abs 2.7 1.7 - 7.7 K/uL   Lymphocytes Relative 20 12 - 46 %   Lymphs Abs 0.8 0.7 - 4.0 K/uL   Monocytes Relative 11 3 - 12 %   Monocytes Absolute 0.4 0.1 - 1.0 K/uL   Eosinophils Relative 1 0 - 5 %   Eosinophils Absolute 0.0 0.0 - 0.7 K/uL   Basophils Relative 1 0 - 1 %   Basophils Absolute 0.0 0.0 - 0.1 K/uL  Lactic acid, plasma     Status: None   Collection Time: 11/20/14  6:00 AM  Result Value Ref Range   Lactic Acid, Venous 0.7 0.5 - 2.0 mmol/L    Dg Chest 2 View  11/19/2014   CLINICAL DATA:  Epigastric pain.  EXAM: CHEST  2 VIEW  COMPARISON:  10/09/2014  FINDINGS: The cardiomediastinal contours are unchanged, heart remains at the upper limits of normal, atherosclerosis noted of the thoracic aorta. Unchanged calcified granuloma in the right upper lung. Pulmonary vasculature is normal. No consolidation, pleural effusion, or pneumothorax. No acute osseous abnormalities are seen.  IMPRESSION: No acute  pulmonary process.   Electronically Signed   By: Jeb Levering M.D.   On: 11/19/2014 21:58   Ct Abdomen Pelvis W Contrast  11/20/2014   CLINICAL DATA:  Severe epigastric pain starting today at 11.  EXAM: CT ABDOMEN AND PELVIS WITH CONTRAST  TECHNIQUE: Multidetector CT imaging of the abdomen and pelvis was performed using the standard protocol following bolus administration of intravenous contrast.  CONTRAST:  71m OMNIPAQUE IOHEXOL 300 MG/ML  SOLN  COMPARISON:  None.  FINDINGS: BODY WALL: No contributory findings.  LOWER CHEST: Extensive coronary atherosclerosis.  ABDOMEN/PELVIS:  Liver: No focal abnormality.  Biliary: Cholecystectomy. There is intra and extrahepatic biliary dilatation with the common bile  duct measuring up to 16 mm. The duct is smoothly tapering and there is no calcified choledocholithiasis.  Pancreas: Unremarkable.  Spleen: Unremarkable.  Adrenals: Unremarkable.  Kidneys and ureters: No hydronephrosis or stone.  Bladder: Unremarkable.  Reproductive: Hysterectomy and probable oophorectomies. Pelvic floor laxity.  Bowel: Widespread colonic diverticulosis. No bowel obstruction or wall thickening. Large but uncomplicated mid duodenal diverticulum. No indication of appendicitis.  Retroperitoneum: No mass or adenopathy.  Peritoneum: No ascites or pneumoperitoneum.  Vascular: No acute abnormality. Densely calcified 14 mm splenic artery aneurysm.  OSSEOUS: No acute abnormalities.  IMPRESSION: Intra and extrahepatic biliary dilatation. Although post cholecystectomy, findings are concerning for obstruction given the liver function tests. There is no visible mass or choledocholithiasis.   Electronically Signed   By: Monte Fantasia M.D.   On: 11/20/2014 01:02    ROS negative except above Blood pressure 118/38, pulse 60, temperature 98 F (36.7 C), temperature source Oral, resp. rate 16, height 5' 2"  (1.575 m), weight 77.066 kg (169 lb 14.4 oz), SpO2 93 %. Physical Exam no acute distress looks  okay vital signs stable afebrile sclerae are not obviously icteric lungs are clear regular rate and rhythm abdomen is soft occasional bowel sounds very minimal upper discomfort only CT and labs reviewed  Assessment/Plan: CBD stricture probably from CBD stone Plan: I had a very long conversation about MRCP versus the risks benefits methods and success rate of ERCP including possibly sphincterotomy biopsy stone removal and stenting and due to the endoscopy schedule we cannot proceed until Thursday so we will follow liver tests and proceed with an MRCP with sedation which we discussed and then tentatively proceed if needed on Thursday and will check on tomorrow  [facility] E 11/20/2014, 1:04 PM

## 2014-11-20 NOTE — H&P (Signed)
Hospitalist Admission History and Physical  Patient name: [name] Medical record number: [id number] Date of birth: 05/21/1929 Age: 81 y.o. Gender: female  Primary Care Provider: Drema Pry, DO  Chief Complaint: abd pain, transaminitis   History of Present Illness:This is a 81 y.o. year old female with significant past medical history of HTN, stage 3 CKD, s/p cholecystectomy in 1980s presenting with abd pain, transaminitis. Patient reports recurrent generalized and epigastric abdominal pain over the past day. Reports having recurring episodes symptoms for an extended period of time the patient is unsure of exact duration. Denies any fevers or chills. No nausea or vomiting. No diarrhea. No dysuria. Does admit to eating fatty foods at times still is on sure if this has made pain worsen. Present to the ER afebrile, hemodynamically stable. Satting mid 90s on room air. White blood cell count 4.9, hemoglobin 11.1, creatinine 1.2. Urinalysis minimally indicative of infection. Lipase 34. AST 2200, ALC 1437, alkaline phosphatase 165, T bili 1.7 CT of the abdomen and pelvis obtained showing intra-and extrahepatic biliary dilatation status post cholecystectomy concerning for obstruction. No visible mass or choledocholithiasis. ED physician assistant [name] discussed case with Dr. Collene [name] with gastroenterology. Recommend inpatient admission for potential ERCP/MRCP in the morning. They will formally consult them.  Assessment and Plan: [name] is a 81 y.o. year old female presenting with abd pain, transaminitis   Active Problems:   Abdominal pain   Transaminitis   1- Abd Pain/transaminitis -likely secondary to biliary source -minimal to mild pain on exam  -IVF  -pain control  -f/u lactate  -f/u GI recs in am   2-HTN -BP stable  -follow  3-CKD -Cr at baseline -follow   FEN/GI: NPO  Prophylaxis: sub q heparin Disposition: pending further evaluation  Code Status:Full Code     Patient Active Problem List   Diagnosis Date Noted  . Abdominal pain 11/20/2014  . Transaminitis 11/20/2014  . Hyponatremia 10/09/2014  . Lightheaded 08/30/2014  . Right hip pain 07/25/2014  . Chronic neck pain 06/06/2014  . Memory loss 06/23/2013  . Right shoulder pain 06/23/2013  . Eczema 01/16/2013  . Leg edema 08/23/2012  . Chronic insomnia 08/09/2012  . Renal insufficiency 03/31/2011  . CYSTOCELE WITHOUT MENTION UTERINE PROLAPSE LAT 08/29/2010  . Anemia of chronic disease 05/06/2010  . NONSPECIFIC ABN FINDING RAD & OTH EXAM GI TRACT 01/24/2009  . LIVER FUNCTION TESTS, ABNORMAL, HX OF 01/14/2009  . HYPOTHYROIDISM 04/04/2007  . Adjustment disorder with mixed anxiety and depressed mood 04/04/2007  . Essential hypertension 04/04/2007   Past Medical History: Past Medical History  Diagnosis Date  . HYPOTHYROIDISM 04/04/2007  . HYPERTENSION 04/04/2007  . Chronic fatigue   . Depression   . Anemia   . Chronic renal insufficiency, stage III (moderate)     Past Surgical History: Past Surgical History  Procedure Laterality Date  . Appendectomy    . Cholecystectomy    . Hernia repair    . Abdominal hysterectomy    . Spine surgery  2011    cervical  . Oophorectomy    . Back surgery  2012  . Orif tibia fracture  2012    dr duda    Social History: History   Social History  . Marital Status: Widowed    Spouse Name: N/A  . Number of Children: N/A  . Years of Education: N/A   Occupational History  . Retired    Social History Main Topics  . Smoking status: Former Smoker --  1.00 packs/day for 7 years    Types: Cigarettes    Quit date: 10/13/1969  . Smokeless tobacco: Not on file  . Alcohol Use: No  . Drug Use: No  . Sexual Activity: Not on file   Other Topics Concern  . None   Social History Narrative    Family History: Family History  Problem Relation Age of Onset  . Heart disease Mother   . Cancer Mother 97    renal cell CA  . Lymphoma Son   .  Heart disease Father     Allergies: Allergies  Allergen Reactions  . Oxycodone Hcl     REACTION: itching    Current Facility-Administered Medications  Medication Dose Route Frequency Provider Last Rate Last Dose  . amLODipine (NORVASC) tablet 7.5 mg  7.5 mg Oral Daily [name] Lever, MD      . heparin injection 5,000 Units  5,000 Units Subcutaneous 3 times per day [name] Lever, MD      . irbesartan Levy Sjogren) tablet 300 mg  300 mg Oral Daily [name] Lever, MD      . levothyroxine (SYNTHROID, LEVOTHROID) tablet 137 mcg  137 mcg Oral QAC breakfast [name] Lever, MD      . loratadine (CLARITIN) tablet 10 mg  10 mg Oral Daily [name] Lever, MD      . multivitamin tablet 1 tablet  1 tablet Oral Daily [name] Lever, MD      . pantoprazole (PROTONIX) EC tablet 40 mg  40 mg Oral Daily [name] Lever, MD      . zolpidem [facility]) tablet 5 mg  5 mg Oral QHS PRN [name] Lever, MD       Current Outpatient Prescriptions  Medication Sig Dispense Refill  . amLODipine (NORVASC) 5 MG tablet Take 1.5 tablets (7.5 mg total) by mouth daily. 135 tablet 1  . cetirizine (ZYRTEC) 10 MG tablet Take 10 mg by mouth daily.    . clidinium-chlordiazePOXIDE (LIBRAX) 2.5-5 MG per capsule 2 (two) times daily as needed.     . fluticasone (FLONASE) 50 MCG/ACT nasal spray Place 2 sprays into both nostrils daily. 16 g 5  . levothyroxine (SYNTHROID, LEVOTHROID) 137 MCG tablet TAKE 1 TABLET DAILY BEFORE BREAKFAST 90 tablet 3  . Multiple Vitamin (MULTIVITAMIN) tablet Take 1 tablet by mouth daily.    Marland Kitchen olmesartan (BENICAR) 40 MG tablet Take 0.5 tablets (20 mg total) by mouth daily. 90 tablet 1  . omeprazole (PRILOSEC) 40 MG capsule Take 1 tablet by mouth daily before breakfast.     . potassium chloride SA (K-DUR,KLOR-CON) 20 MEQ tablet Take 1 tablet by mouth daily.    . sodium chloride (OCEAN) 0.65 % SOLN nasal spray Place 1 spray into both nostrils as needed for congestion.    . traMADol (ULTRAM) 50 MG tablet  Take 0.5 tablets (25 mg total) by mouth every 12 (twelve) hours as needed (Do not take near bedtime). 30 tablet 1  . zolpidem (AMBIEN) 5 MG tablet Take 1 tablet (5 mg total) by mouth at bedtime as needed for sleep. 30 tablet 3   Review Of Systems: 12 point ROS negative except as noted above in HPI.  Physical Exam: Filed Vitals:   11/20/14 0115  BP: 135/56  Pulse: 66  Temp:   Resp: 16    General: alert and cooperative HEENT: PERRLA and extra ocular movement intact Heart: RRR, no rubs, gallops, murmurs  Lungs: clear to auscultation, no wheezes or rales and  unlabored breathing Abdomen: transabdominal scar on abdomen, minimal tenderness Extremities: extremities normal, atraumatic, no cyanosis or edema Skin:no rashes Neurology: normal without focal findings  Labs and Imaging: Lab Results  Component Value Date/Time   NA 134* 11/19/2014 08:57 PM   K 4.3 11/19/2014 08:57 PM   CL 101 11/19/2014 08:57 PM   CO2 20 11/19/2014 08:57 PM   BUN 16 11/19/2014 08:57 PM   CREATININE 1.20* 11/19/2014 08:57 PM   GLUCOSE 116* 11/19/2014 08:57 PM   Lab Results  Component Value Date   WBC 4.9 11/19/2014   HGB 11.1* 11/19/2014   HCT 33.3* 11/19/2014   MCV 89.3 11/19/2014   PLT 261 11/19/2014   Urinalysis    Component Value Date/Time   COLORURINE YELLOW 11/19/2014 2025   APPEARANCEUR CLEAR 11/19/2014 2025   LABSPEC 1.014 11/19/2014 2025   PHURINE 6.5 11/19/2014 2025   GLUCOSEU NEGATIVE 11/19/2014 2025   GLUCOSEU NEGATIVE 11/15/2013 1636   HGBUR NEGATIVE 11/19/2014 2025   BILIRUBINUR NEGATIVE 11/19/2014 2025   KETONESUR NEGATIVE 11/19/2014 2025   PROTEINUR NEGATIVE 11/19/2014 2025   UROBILINOGEN 1.0 11/19/2014 2025   NITRITE NEGATIVE 11/19/2014 2025   LEUKOCYTESUR TRACE* 11/19/2014 2025      Dg Chest 2 View  11/19/2014   CLINICAL DATA:  Epigastric pain.  EXAM: CHEST  2 VIEW  COMPARISON:  10/09/2014  FINDINGS: The cardiomediastinal contours are unchanged, heart remains at the upper  limits of normal, atherosclerosis noted of the thoracic aorta. Unchanged calcified granuloma in the right upper lung. Pulmonary vasculature is normal. No consolidation, pleural effusion, or pneumothorax. No acute osseous abnormalities are seen.  IMPRESSION: No acute pulmonary process.   Electronically Signed   By: Jeb Levering M.D.   On: 11/19/2014 21:58   Ct Abdomen Pelvis W Contrast  11/20/2014   CLINICAL DATA:  Severe epigastric pain starting today at 11.  EXAM: CT ABDOMEN AND PELVIS WITH CONTRAST  TECHNIQUE: Multidetector CT imaging of the abdomen and pelvis was performed using the standard protocol following bolus administration of intravenous contrast.  CONTRAST:  39mL OMNIPAQUE IOHEXOL 300 MG/ML  SOLN  COMPARISON:  None.  FINDINGS: BODY WALL: No contributory findings.  LOWER CHEST: Extensive coronary atherosclerosis.  ABDOMEN/PELVIS:  Liver: No focal abnormality.  Biliary: Cholecystectomy. There is intra and extrahepatic biliary dilatation with the common bile duct measuring up to 16 mm. The duct is smoothly tapering and there is no calcified choledocholithiasis.  Pancreas: Unremarkable.  Spleen: Unremarkable.  Adrenals: Unremarkable.  Kidneys and ureters: No hydronephrosis or stone.  Bladder: Unremarkable.  Reproductive: Hysterectomy and probable oophorectomies. Pelvic floor laxity.  Bowel: Widespread colonic diverticulosis. No bowel obstruction or wall thickening. Large but uncomplicated mid duodenal diverticulum. No indication of appendicitis.  Retroperitoneum: No mass or adenopathy.  Peritoneum: No ascites or pneumoperitoneum.  Vascular: No acute abnormality. Densely calcified 14 mm splenic artery aneurysm.  OSSEOUS: No acute abnormalities.  IMPRESSION: Intra and extrahepatic biliary dilatation. Although post cholecystectomy, findings are concerning for obstruction given the liver function tests. There is no visible mass or choledocholithiasis.   Electronically Signed   By: Monte Fantasia M.D.    On: 11/20/2014 01:02           Shanda Howells MD  Pager: [phone]

## 2014-11-21 DIAGNOSIS — R740 Nonspecific elevation of levels of transaminase and lactic acid dehydrogenase [LDH]: Secondary | ICD-10-CM

## 2014-11-21 LAB — COMPREHENSIVE METABOLIC PANEL
ALK PHOS: 172 U/L — AB (ref 39–117)
ALT: 827 U/L — AB (ref 0–35)
AST: 441 U/L — AB (ref 0–37)
Albumin: 3.2 g/dL — ABNORMAL LOW (ref 3.5–5.2)
Anion gap: 12 (ref 5–15)
BUN: 10 mg/dL (ref 6–23)
CHLORIDE: 100 mmol/L (ref 96–112)
CO2: 20 mmol/L (ref 19–32)
Calcium: 8.8 mg/dL (ref 8.4–10.5)
Creatinine, Ser: 1.06 mg/dL (ref 0.50–1.10)
GFR calc Af Amer: 55 mL/min — ABNORMAL LOW (ref >90)
GFR, EST NON AFRICAN AMERICAN: 47 mL/min — AB (ref >90)
Glucose, Bld: 84 mg/dL (ref 70–99)
POTASSIUM: 4.3 mmol/L (ref 3.5–5.1)
SODIUM: 132 mmol/L — AB (ref 135–145)
Total Bilirubin: 3.6 mg/dL — ABNORMAL HIGH (ref 0.3–1.2)
Total Protein: 5.8 g/dL — ABNORMAL LOW (ref 6.0–8.3)

## 2014-11-21 LAB — CBC WITH DIFFERENTIAL/PLATELET
BASOS PCT: 1 % (ref 0–1)
Basophils Absolute: 0.1 10*3/uL (ref 0.0–0.1)
EOS PCT: 5 % (ref 0–5)
Eosinophils Absolute: 0.2 10*3/uL (ref 0.0–0.7)
HEMATOCRIT: 32.4 % — AB (ref 36.0–46.0)
Hemoglobin: 10.7 g/dL — ABNORMAL LOW (ref 12.0–15.0)
LYMPHS PCT: 28 % (ref 12–46)
Lymphs Abs: 1.1 10*3/uL (ref 0.7–4.0)
MCH: 29.6 pg (ref 26.0–34.0)
MCHC: 33 g/dL (ref 30.0–36.0)
MCV: 89.5 fL (ref 78.0–100.0)
MONO ABS: 0.4 10*3/uL (ref 0.1–1.0)
Monocytes Relative: 11 % (ref 3–12)
NEUTROS ABS: 2.1 10*3/uL (ref 1.7–7.7)
Neutrophils Relative %: 55 % (ref 43–77)
PLATELETS: 253 10*3/uL (ref 150–400)
RBC: 3.62 MIL/uL — ABNORMAL LOW (ref 3.87–5.11)
RDW: 13.4 % (ref 11.5–15.5)
WBC: 3.9 10*3/uL — AB (ref 4.0–10.5)

## 2014-11-21 MED ORDER — CIPROFLOXACIN IN D5W 400 MG/200ML IV SOLN
400.0000 mg | Freq: Once | INTRAVENOUS | Status: AC
  Administered 2014-11-22: 400 mg via INTRAVENOUS
  Filled 2014-11-21: qty 200

## 2014-11-21 MED ORDER — SODIUM CHLORIDE 0.9 % IV SOLN: INTRAVENOUS | Status: DC

## 2014-11-21 NOTE — Progress Notes (Signed)
[name] 2:00 PM  Subjective: Patient feeling better still with some pain and is about to eat her first meal but no new complaints and we reviewed her 2010 episode which was similar and rediscussed her MRCP versus ERCP and whether we should proceed or not with her and her son  Objective: Vital signs stable afebrile no acute distress abdomen is soft nontender CBC stable LFTs decreasing except for bilirubin slight increase and alkaline phosphatase minimally increased MRCP reviewed  Assessment: Probably passed CBD stones in patient with known periampullary diverticulum  Plan: After our prolonged conversation today as well as yesterday we've elected to proceed with an ERCP tomorrow unless patient changes her my mostly for a sphincterotomy to decrease chance of recurrent problems as well as to make sure no residual CBD stones and again the risks were rediscussed  [facility] E

## 2014-11-21 NOTE — Progress Notes (Signed)
PROGRESS NOTE  [name] [id number]:[id number] DOB: [birth year] DOA: 11/19/2014 PCP: Drema Pry, DO  HPI/Recap of past 24 hours:  Request to hve abx piror to ERCP, reported she has history of getting severe infection with procedures, reported she always get abx a week ahead of time prior to procedure Assessment/Plan: Active Problems:   Abdominal pain   Transaminitis  - Abd Pain/transaminitis -likely secondary to biliary source, ? cbd dilation? H/o cholecystectomy -denies pain on 4/13 -IVF   - lactate wnl -ERCP 4/14 per GI  2-HTN -BP stable    3-CKD -Cr at baseline   FEN/GI: NPO  Prophylaxis: sub q heparin Disposition: pending further evaluation  Code Status:Full Code   Code Status: full  Family Communication: patient  Disposition Plan: remain inpatient   Consultants:  GI  Procedures:   Ercp 4/14  Antibiotics:  cipro from 4/14   Objective: BP 141/51 mmHg  Pulse 59  Temp(Src) 97.8 F (36.6 C) (Oral)  Resp 18  Ht 5\' 2"  (1.575 m)  Wt 77.111 kg (170 lb)  BMI 31.09 kg/m2  SpO2 95%  Intake/Output Summary (Last 24 hours) at 11/21/14 1459 Last data filed at 11/21/14 1408  Gross per 24 hour  Intake   1860 ml  Output      0 ml  Net   1860 ml   Filed Weights   11/19/14 1943 11/20/14 0306 11/20/14 2112  Weight: 75.751 kg (167 lb) 77.066 kg (169 lb 14.4 oz) 77.111 kg (170 lb)    Exam:   General:  NAD  Cardiovascular: RRR  Respiratory: CTABL  Abdomen: Soft/ND/NT, positive BS  Musculoskeletal: No Edema  Neuro: aaox3  Data Reviewed: Basic Metabolic Panel:  Recent Labs Lab 11/19/14 2057 11/20/14 0600 11/21/14 0457  NA 134* 131* 132*  K 4.3 4.5 4.3  CL 101 98 100  CO2 20 27 20   GLUCOSE 116* 112* 84  BUN 16 14 10   CREATININE 1.20* 1.16* 1.06  CALCIUM 9.3 9.0 8.8   Liver Function Tests:  Recent Labs Lab 11/19/14 2057 11/20/14 0600 11/21/14 0457  AST 2220* 1136* 441*  ALT 1437* 1263* 827*  ALKPHOS 165* 164* 172*    BILITOT 1.7* 2.5* 3.6*  PROT 6.8 6.1 5.8*  ALBUMIN 4.1 3.4* 3.2*    Recent Labs Lab 11/19/14 2059  LIPASE 34   No results for input(s): AMMONIA in the last 168 hours. CBC:  Recent Labs Lab 11/19/14 2057 11/20/14 0600 11/21/14 0457  WBC 4.9 4.0 3.9*  NEUTROABS 3.6 2.7 2.1  HGB 11.1* 10.1* 10.7*  HCT 33.3* 30.4* 32.4*  MCV 89.3 88.4 89.5  PLT 261 255 253   Cardiac Enzymes:   No results for input(s): CKTOTAL, CKMB, CKMBINDEX, TROPONINI in the last 168 hours. BNP (last 3 results) No results for input(s): BNP in the last 8760 hours.  ProBNP (last 3 results)  Recent Labs  11/28/13 1353  PROBNP 49.0    CBG: No results for input(s): GLUCAP in the last 168 hours.  No results found for this or any previous visit (from the past 240 hour(s)).   Studies: Ct Abdomen Pelvis W Contrast  11/20/2014   CLINICAL DATA:  Severe epigastric pain starting today at 11.  EXAM: CT ABDOMEN AND PELVIS WITH CONTRAST  TECHNIQUE: Multidetector CT imaging of the abdomen and pelvis was performed using the standard protocol following bolus administration of intravenous contrast.  CONTRAST:  37mL OMNIPAQUE IOHEXOL 300 MG/ML  SOLN  COMPARISON:  None.  FINDINGS: BODY WALL: No contributory findings.  LOWER CHEST: Extensive coronary atherosclerosis.  ABDOMEN/PELVIS:  Liver: No focal abnormality.  Biliary: Cholecystectomy. There is intra and extrahepatic biliary dilatation with the common bile duct measuring up to 16 mm. The duct is smoothly tapering and there is no calcified choledocholithiasis.  Pancreas: Unremarkable.  Spleen: Unremarkable.  Adrenals: Unremarkable.  Kidneys and ureters: No hydronephrosis or stone.  Bladder: Unremarkable.  Reproductive: Hysterectomy and probable oophorectomies. Pelvic floor laxity.  Bowel: Widespread colonic diverticulosis. No bowel obstruction or wall thickening. Large but uncomplicated mid duodenal diverticulum. No indication of appendicitis.  Retroperitoneum: No mass or  adenopathy.  Peritoneum: No ascites or pneumoperitoneum.  Vascular: No acute abnormality. Densely calcified 14 mm splenic artery aneurysm.  OSSEOUS: No acute abnormalities.  IMPRESSION: Intra and extrahepatic biliary dilatation. Although post cholecystectomy, findings are concerning for obstruction given the liver function tests. There is no visible mass or choledocholithiasis.   Electronically Signed   By: Monte Fantasia M.D.   On: 11/20/2014 01:02   Mr Abdomen Mrcp Wo Cm  11/20/2014   CLINICAL DATA:  Biliary dilatation, status post cholecystectomy, elevated LFTs  EXAM: MRI ABDOMEN WITHOUT CONTRAST  (INCLUDING MRCP)  TECHNIQUE: Multiplanar multisequence MR imaging of the abdomen was performed. Heavily T2-weighted images of the biliary and pancreatic ducts were obtained, and three-dimensional MRCP images were rendered by post processing.  COMPARISON:  CT abdomen pelvis dated 11/19/2014  FINDINGS: Motion degraded images.  Lower chest:  Lung bases are clear.  Hepatobiliary: Liver is notable for mild hepatic steatosis. No focal hepatic lesion is seen.  Status post cholecystectomy. Mild central intrahepatic ductal dilatation, left greater than right.  Dilated common duct, measuring 18 mm proximally and smoothly tapering to 9 mm distally.  No choledocholithiasis is visualized. A distal CBD stricture is not excluded, but an obstructing mass is not visualized on unenhanced MRI.  Pancreas: Within normal limits.  Spleen: Within normal limits.  Adrenals/Urinary Tract: Adrenal glands are within normal limits.  7 mm anterior right lower pole renal cyst (series 7/ image 31). No hydronephrosis.  Stomach/Bowel: Proximal gastric wall is mildly thick-walled although underdistended.  Moderate duodenal diverticulum adjacent to the ampulla (series 7/ image 22).  Colonic diverticulosis.  Vascular/Lymphatic: No evidence of abdominal aortic aneurysm.  No suspicious abdominal lymphadenopathy.  Other: No abdominal ascites.   Musculoskeletal: No focal osseous lesions.  IMPRESSION: Motion degraded images.  Status post cholecystectomy. Mild central intrahepatic ductal dilatation. Dilated common duct, measuring 18 mm proximally and smoothly tapering to 9 mm distally.  No choledocholithiasis is seen. Distal CBD stricture is not excluded. No obstructing mass is visualized on unenhanced MRI.  Moderate duodenum diverticulum adjacent to the ampulla.   Electronically Signed   By: Julian Hy M.D.   On: 11/20/2014 18:31   Mr 3d Recon At Scanner  11/20/2014   CLINICAL DATA:  Biliary dilatation, status post cholecystectomy, elevated LFTs  EXAM: MRI ABDOMEN WITHOUT CONTRAST  (INCLUDING MRCP)  TECHNIQUE: Multiplanar multisequence MR imaging of the abdomen was performed. Heavily T2-weighted images of the biliary and pancreatic ducts were obtained, and three-dimensional MRCP images were rendered by post processing.  COMPARISON:  CT abdomen pelvis dated 11/19/2014  FINDINGS: Motion degraded images.  Lower chest:  Lung bases are clear.  Hepatobiliary: Liver is notable for mild hepatic steatosis. No focal hepatic lesion is seen.  Status post cholecystectomy. Mild central intrahepatic ductal dilatation, left greater than right.  Dilated common duct, measuring 18 mm proximally and smoothly tapering to 9 mm distally.  No choledocholithiasis is visualized. A distal  CBD stricture is not excluded, but an obstructing mass is not visualized on unenhanced MRI.  Pancreas: Within normal limits.  Spleen: Within normal limits.  Adrenals/Urinary Tract: Adrenal glands are within normal limits.  7 mm anterior right lower pole renal cyst (series 7/ image 31). No hydronephrosis.  Stomach/Bowel: Proximal gastric wall is mildly thick-walled although underdistended.  Moderate duodenal diverticulum adjacent to the ampulla (series 7/ image 22).  Colonic diverticulosis.  Vascular/Lymphatic: No evidence of abdominal aortic aneurysm.  No suspicious abdominal  lymphadenopathy.  Other: No abdominal ascites.  Musculoskeletal: No focal osseous lesions.  IMPRESSION: Motion degraded images.  Status post cholecystectomy. Mild central intrahepatic ductal dilatation. Dilated common duct, measuring 18 mm proximally and smoothly tapering to 9 mm distally.  No choledocholithiasis is seen. Distal CBD stricture is not excluded. No obstructing mass is visualized on unenhanced MRI.  Moderate duodenum diverticulum adjacent to the ampulla.   Electronically Signed   By: Julian Hy M.D.   On: 11/20/2014 18:31    Scheduled Meds: . amLODipine  7.5 mg Oral Daily  . heparin  5,000 Units Subcutaneous 3 times per day  . irbesartan  300 mg Oral Daily  . levothyroxine  137 mcg Oral QAC breakfast  . loratadine  10 mg Oral Daily  . multivitamin with minerals  1 tablet Oral Daily  . pantoprazole (PROTONIX) IV  40 mg Intravenous Q24H    Continuous Infusions: . sodium chloride 75 mL/hr at 11/21/14 1337  . sodium chloride       Time spent: 40mins  Wataru Mccowen MD, PhD  [facility] Pager [phone]. If 7PM-7AM, please contact night-coverage at [email or web address], password [facility] 11/21/2014, 2:59 PM  LOS: 1 day

## 2014-11-21 NOTE — Progress Notes (Signed)
Pt's daughter Deeann Saint called with concerns regarding the pt's procedure scheduled for tomorrow. Daughter stated that the pt has a hx becoming septic after surgeries. Dr. Watt Climes notified.

## 2014-11-22 ENCOUNTER — Inpatient Hospital Stay (HOSPITAL_COMMUNITY): Payer: Medicare Other | Admitting: Certified Registered"

## 2014-11-22 ENCOUNTER — Inpatient Hospital Stay (HOSPITAL_COMMUNITY): Payer: Medicare Other

## 2014-11-22 ENCOUNTER — Encounter (HOSPITAL_COMMUNITY): Payer: Self-pay | Admitting: Certified Registered"

## 2014-11-22 ENCOUNTER — Encounter (HOSPITAL_COMMUNITY)
Admission: EM | Disposition: A | Discharge status: Home / Self Care | Payer: Self-pay | Source: Home / Self Care | Attending: Internal Medicine

## 2014-11-22 HISTORY — PX: ERCP: SHX5425

## 2014-11-22 LAB — CBC WITH DIFFERENTIAL/PLATELET
BASOS ABS: 0 10*3/uL (ref 0.0–0.1)
BASOS PCT: 1 % (ref 0–1)
EOS ABS: 0.2 10*3/uL (ref 0.0–0.7)
EOS PCT: 3 % (ref 0–5)
HEMATOCRIT: 31.3 % — AB (ref 36.0–46.0)
Hemoglobin: 10.4 g/dL — ABNORMAL LOW (ref 12.0–15.0)
Lymphocytes Relative: 24 % (ref 12–46)
Lymphs Abs: 1.1 10*3/uL (ref 0.7–4.0)
MCH: 29.6 pg (ref 26.0–34.0)
MCHC: 33.2 g/dL (ref 30.0–36.0)
MCV: 89.2 fL (ref 78.0–100.0)
MONO ABS: 0.5 10*3/uL (ref 0.1–1.0)
MONOS PCT: 12 % (ref 3–12)
NEUTROS ABS: 2.8 10*3/uL (ref 1.7–7.7)
Neutrophils Relative %: 60 % (ref 43–77)
PLATELETS: 235 10*3/uL (ref 150–400)
RBC: 3.51 MIL/uL — ABNORMAL LOW (ref 3.87–5.11)
RDW: 13.4 % (ref 11.5–15.5)
WBC: 4.7 10*3/uL (ref 4.0–10.5)

## 2014-11-22 LAB — COMPREHENSIVE METABOLIC PANEL
ALT: 564 U/L — ABNORMAL HIGH (ref 0–35)
AST: 175 U/L — ABNORMAL HIGH (ref 0–37)
Albumin: 3.3 g/dL — ABNORMAL LOW (ref 3.5–5.2)
Alkaline Phosphatase: 194 U/L — ABNORMAL HIGH (ref 39–117)
Anion gap: 9 (ref 5–15)
BUN: 7 mg/dL (ref 6–23)
CALCIUM: 9.3 mg/dL (ref 8.4–10.5)
CO2: 24 mmol/L (ref 19–32)
CREATININE: 1.04 mg/dL (ref 0.50–1.10)
Chloride: 104 mmol/L (ref 96–112)
GFR calc Af Amer: 56 mL/min — ABNORMAL LOW (ref >90)
GFR calc non Af Amer: 48 mL/min — ABNORMAL LOW (ref >90)
GLUCOSE: 99 mg/dL (ref 70–99)
Potassium: 4.1 mmol/L (ref 3.5–5.1)
Sodium: 137 mmol/L (ref 135–145)
Total Bilirubin: 1.4 mg/dL — ABNORMAL HIGH (ref 0.3–1.2)
Total Protein: 5.9 g/dL — ABNORMAL LOW (ref 6.0–8.3)

## 2014-11-22 LAB — IGG: IGG (IMMUNOGLOBIN G), SERUM: 778 mg/dL (ref 700–1600)

## 2014-11-22 SURGERY — ERCP, WITH INTERVENTION IF INDICATED
Anesthesia: General

## 2014-11-22 MED ORDER — LIDOCAINE HCL (CARDIAC) 20 MG/ML IV SOLN
INTRAVENOUS | Status: DC | PRN
  Administered 2014-11-22: 60 mg via INTRAVENOUS

## 2014-11-22 MED ORDER — ONDANSETRON HCL 4 MG/2ML IJ SOLN
INTRAMUSCULAR | Status: DC | PRN
  Administered 2014-11-22: 4 mg via INTRAVENOUS

## 2014-11-22 MED ORDER — FENTANYL CITRATE (PF) 100 MCG/2ML IJ SOLN
INTRAMUSCULAR | Status: DC | PRN
  Administered 2014-11-22: 50 ug via INTRAVENOUS

## 2014-11-22 MED ORDER — PHENYLEPHRINE HCL 10 MG/ML IJ SOLN
INTRAMUSCULAR | Status: DC | PRN
  Administered 2014-11-22 (×2): 40 ug via INTRAVENOUS

## 2014-11-22 MED ORDER — INDOMETHACIN 50 MG RE SUPP
100.0000 mg | Freq: Once | RECTAL | Status: AC
  Administered 2014-11-22: 100 mg via RECTAL
  Filled 2014-11-22: qty 2

## 2014-11-22 MED ORDER — SUCCINYLCHOLINE CHLORIDE 20 MG/ML IJ SOLN
INTRAMUSCULAR | Status: DC | PRN
  Administered 2014-11-22: 80 mg via INTRAVENOUS

## 2014-11-22 MED ORDER — MIDAZOLAM HCL 5 MG/5ML IJ SOLN
INTRAMUSCULAR | Status: DC | PRN
  Administered 2014-11-22: 1 mg via INTRAVENOUS

## 2014-11-22 MED ORDER — PROPOFOL 10 MG/ML IV BOLUS
INTRAVENOUS | Status: DC | PRN
  Administered 2014-11-22: 110 mg via INTRAVENOUS
  Administered 2014-11-22: 20 mg via INTRAVENOUS

## 2014-11-22 NOTE — Progress Notes (Signed)
PROGRESS NOTE  [name] HYW:[id number] DOB: [birth year] DOA: 11/19/2014 PCP: Drema Pry, DO  HPI/Recap of past 24 hours:  Returned from ERCP, drowsy, no acute complaints, family at bedside.   Assessment/Plan: Active Problems:   Abdominal pain   Transaminitis  - Abd Pain/transaminitis -likely secondary to biliary source, ? cbd dilation? H/o cholecystectomy - lactate wnl -ERCP 4/14 , per GI her ampulla was found inside the diverticuli at approximately 7:00 at a very difficult angle, was not able to perform regular sphincterectomy. -will follow GI rec  2-HTN -BP stable    3-CKD -Cr at baseline   FEN/GI: diet advancement per GI Prophylaxis: sub q heparin Disposition: likely home on 4/15 Code Status:Full Code   Code Status: full  Family Communication: patient and family    Consultants:  GI  Procedures:   Ercp 4/14  Antibiotics:  cipro from 4/14   Objective: BP 165/51 mmHg  Pulse 58  Temp(Src) 97.7 F (36.5 C) (Oral)  Resp 19  Ht 5\' 2"  (1.575 m)  Wt 77.111 kg (170 lb)  BMI 31.09 kg/m2  SpO2 94%  Intake/Output Summary (Last 24 hours) at 11/22/14 1954 Last data filed at 11/22/14 1925  Gross per 24 hour  Intake 1168.67 ml  Output    301 ml  Net 867.67 ml   Filed Weights   11/19/14 1943 11/20/14 0306 11/20/14 2112  Weight: 75.751 kg (167 lb) 77.066 kg (169 lb 14.4 oz) 77.111 kg (170 lb)    Exam:   General:  NAD  Cardiovascular: RRR  Respiratory: CTABL  Abdomen: Soft/ND/NT, positive BS  Musculoskeletal: No Edema  Neuro: aaox3  Data Reviewed: Basic Metabolic Panel:  Recent Labs Lab 11/19/14 2057 11/20/14 0600 11/21/14 0457 11/22/14 0620  NA 134* 131* 132* 137  K 4.3 4.5 4.3 4.1  CL 101 98 100 104  CO2 20 27 20 24   GLUCOSE 116* 112* 84 99  BUN 16 14 10 7   CREATININE 1.20* 1.16* 1.06 1.04  CALCIUM 9.3 9.0 8.8 9.3   Liver Function Tests:  Recent Labs Lab 11/19/14 2057 11/20/14 0600 11/21/14 0457 11/22/14 0620   AST 2220* 1136* 441* 175*  ALT 1437* 1263* 827* 564*  ALKPHOS 165* 164* 172* 194*  BILITOT 1.7* 2.5* 3.6* 1.4*  PROT 6.8 6.1 5.8* 5.9*  ALBUMIN 4.1 3.4* 3.2* 3.3*    Recent Labs Lab 11/19/14 2059  LIPASE 34   No results for input(s): AMMONIA in the last 168 hours. CBC:  Recent Labs Lab 11/19/14 2057 11/20/14 0600 11/21/14 0457 11/22/14 0620  WBC 4.9 4.0 3.9* 4.7  NEUTROABS 3.6 2.7 2.1 2.8  HGB 11.1* 10.1* 10.7* 10.4*  HCT 33.3* 30.4* 32.4* 31.3*  MCV 89.3 88.4 89.5 89.2  PLT 261 255 253 235   Cardiac Enzymes:   No results for input(s): CKTOTAL, CKMB, CKMBINDEX, TROPONINI in the last 168 hours. BNP (last 3 results) No results for input(s): BNP in the last 8760 hours.  ProBNP (last 3 results)  Recent Labs  11/28/13 1353  PROBNP 49.0    CBG: No results for input(s): GLUCAP in the last 168 hours.  No results found for this or any previous visit (from the past 240 hour(s)).   Studies: No results found.  Scheduled Meds: . amLODipine  7.5 mg Oral Daily  . heparin  5,000 Units Subcutaneous 3 times per day  . irbesartan  300 mg Oral Daily  . levothyroxine  137 mcg Oral QAC breakfast  . loratadine  10 mg Oral Daily  .  multivitamin with minerals  1 tablet Oral Daily  . pantoprazole (PROTONIX) IV  40 mg Intravenous Q24H    Continuous Infusions: . sodium chloride 75 mL/hr at 11/22/14 1803     Time spent: 37mins  Azuri Bozard MD, PhD  [facility] Pager [phone]. If 7PM-7AM, please contact night-coverage at [email or web address], password [facility] 11/22/2014, 7:54 PM  LOS: 2 days

## 2014-11-22 NOTE — Op Note (Signed)
[facility] [place], [id number]   ERCP PROCEDURE REPORT  PATIENT: [name], [name]  MR# :[id number] BIRTHDATE: 03/29/1929  GENDER: female ENDOSCOPIST: Clarene Essex, MD REFERRED BY: PROCEDURE DATE:  11/22/2014 PROCEDURE:   ERCP, diagnostic ASA CLASS:    3 INDICATIONS: recurrent probable CBD stones MEDICATIONS:    general anesthesia TOPICAL ANESTHETIC:  none  DESCRIPTION OF PROCEDURE:   After the risks benefits and alternatives of the procedure were thoroughly explained, informed consent was obtained.  The ercp pentax [id number]  endoscope was introduced through the mouth and advanced to the second portion of the duodenum .a moderate sized diverticuli was brought into view and her ampulla was found inside the diverticuli at approximately 7:00 at a very difficult angle and we tried both the regular sphincterotome loaded with the JAG Jagwireand also tried the reverse sphincterotome to get a better angle but were unsuccessful at cannulation so we went ahead and rule out her on her side and retried with those 2 catheters as well as the smaller sphincterotome and wire and at one point the wire advanced up a duct but because she was on her side we were not sure which duct and unfortunately we injected a little bit of dye into the pancreas and the wire was removed and further efforts at cannulation were unsuccessful after a prolonged effort we elected to stop the procedure and the patient tolerated the procedure well there was no obvious immediate complication Estimated blood loss is zero unless otherwise noted in this procedure report.       COMPLICATIONS:   none  ENDOSCOPIC IMPRESSION:1.moderate size duodenal diverticulum with ampulla at 7:00 only able to cannulate pancreatic duct 1 time as above #2.no additional findings  RECOMMENDATIONS:observe for delayed complications and will give him indomethacin suppository to decrease risk of  pancreatitis and if no delayed problems can probably go home tomorrow and follow up with me in a week or 2 to make sure liver tests return to normal and we'll [facility] versus observe for other recurrences     _______________________________ eSigned:  Clarene Essex, MD 11/22/2014 3:57 PM   CC:

## 2014-11-22 NOTE — Anesthesia Preprocedure Evaluation (Addendum)
Anesthesia Evaluation  Patient identified by MRN, date of birth, ID band Patient awake    Reviewed: Allergy & Precautions, NPO status , Patient's Chart, lab work & pertinent test results  Airway Mallampati: II  TM Distance: >3 FB Neck ROM: Full    Dental   Pulmonary former smoker,  breath sounds clear to auscultation        Cardiovascular hypertension, Pt. on medications Rhythm:Regular Rate:Normal     Neuro/Psych negative neurological ROS     GI/Hepatic negative GI ROS, (+) Hepatitis -Biliary obstruction with elevation LFTs.- downward trending.   Endo/Other  Hypothyroidism   Renal/GU negative Renal ROS     Musculoskeletal   Abdominal   Peds  Hematology  (+) anemia , Hgb 10.4   Anesthesia Other Findings   Reproductive/Obstetrics                            Anesthesia Physical Anesthesia Plan  ASA: III  Anesthesia Plan: General   Post-op Pain Management:    Induction: Intravenous  Airway Management Planned: Oral ETT  Additional Equipment:   Intra-op Plan:   Post-operative Plan: Extubation in OR  Informed Consent: I have reviewed the patients History and Physical, chart, labs and discussed the procedure including the risks, benefits and alternatives for the proposed anesthesia with the patient or authorized representative who has indicated his/her understanding and acceptance.   Dental advisory given  Plan Discussed with: CRNA  Anesthesia Plan Comments:         Anesthesia Quick Evaluation

## 2014-11-22 NOTE — Care Management Note (Signed)
CARE MANAGEMENT NOTE 11/22/2014  Patient:  [name], [name]   Account Number:  [id number]  Date Initiated:  11/22/2014  Documentation initiated by:  Zettie Gootee  Subjective/Objective Assessment:   CM following for progression and d/c planning.     Action/Plan:   Pt for procedure today, IM delivered , plan is to return to home has supportive family.   Anticipated DC Date:  11/24/2014   Anticipated DC Plan:  HOME/SELF CARE         Choice offered to / List presented to:             Status of service:  In process, will continue to follow Medicare Important Message given?  YES (If response is "NO", the following Medicare IM given date fields will be blank) Date Medicare IM given:  11/22/2014 Medicare IM given by:  Janus Vlcek Date Additional Medicare IM given:   Additional Medicare IM given by:    Discharge Disposition:    Per UR Regulation:    If discussed at Long Length of Stay Meetings, dates discussed:    Comments:

## 2014-11-22 NOTE — Progress Notes (Signed)
[name] 1:33 PM  Subjective: Patient doing well without any new complaints and again we had a long conversation with the patient in multiple family members including the risk and benefits and success rate of ERCP and discussed her periampullary diverticulum and we discussed preventing future attacks and all of her questions were answered and we reviewed her 2010 similar episode as well as her EUS findings from that  Objective: Vital signs stable afebrile no acute distress exam please see preassessment evaluation labs improved  Assessment: Recurrent probable CBD stones  Plan: Okay to proceed with ERCP and hopefully sphincterotomy with anesthesia assistance  [facility] E

## 2014-11-22 NOTE — Anesthesia Procedure Notes (Signed)
Procedure Name: Intubation Date/Time: 11/22/2014 1:40 PM Performed by: Melina Copa, Santia Labate R Pre-anesthesia Checklist: Patient identified, Emergency Drugs available, Suction available, Patient being monitored and Timeout performed Patient Re-evaluated:Patient Re-evaluated prior to inductionOxygen Delivery Method: Circle system utilized Preoxygenation: Pre-oxygenation with 100% oxygen Intubation Type: IV induction Ventilation: Mask ventilation without difficulty Laryngoscope Size: Mac and 3 Grade View: Grade II Tube type: Oral Number of attempts: 1 Airway Equipment and Method: Stylet Placement Confirmation: ETT inserted through vocal cords under direct vision and positive ETCO2 Secured at: 21 cm Tube secured with: Tape Dental Injury: Teeth and Oropharynx as per pre-operative assessment

## 2014-11-22 NOTE — Transfer of Care (Signed)
Immediate Anesthesia Transfer of Care Note  Patient: [name]  Procedure(s) Performed: Procedure(s): ENDOSCOPIC RETROGRADE CHOLANGIOPANCREATOGRAPHY (ERCP) (N/A)  Patient Location: Endoscopy Unit  Anesthesia Type:General  Level of Consciousness: sedated  Airway & Oxygen Therapy: Patient Spontanous Breathing and Patient connected to nasal cannula oxygen  Post-op Assessment: Report given to RN, Post -op Vital signs reviewed and stable and Patient moving all extremities  Post vital signs: Reviewed and stable  Last Vitals:  Filed Vitals:   11/22/14 1235  BP: 162/70  Pulse: 67  Temp: 37.2 C  Resp: 19    Complications: No apparent anesthesia complications

## 2014-11-22 NOTE — Anesthesia Postprocedure Evaluation (Signed)
  Anesthesia Post-op Note  Patient: [name]  Procedure(s) Performed: Procedure(s): ENDOSCOPIC RETROGRADE CHOLANGIOPANCREATOGRAPHY (ERCP) (N/A)  Patient Location: PACU  Anesthesia Type:General  Level of Consciousness: awake and alert   Airway and Oxygen Therapy: Patient Spontanous Breathing  Post-op Pain: none  Post-op Assessment: Post-op Vital signs reviewed  Post-op Vital Signs: Reviewed  Last Vitals:  Filed Vitals:   11/22/14 1635  BP: 165/51  Pulse: 58  Temp:   Resp: 19    Complications: No apparent anesthesia complications

## 2014-11-23 ENCOUNTER — Encounter (HOSPITAL_COMMUNITY): Payer: Self-pay | Admitting: Gastroenterology

## 2014-11-23 DIAGNOSIS — K8050 Calculus of bile duct without cholangitis or cholecystitis without obstruction: Secondary | ICD-10-CM

## 2014-11-23 DIAGNOSIS — R1084 Generalized abdominal pain: Secondary | ICD-10-CM

## 2014-11-23 LAB — URINALYSIS, ROUTINE W REFLEX MICROSCOPIC
Bilirubin Urine: NEGATIVE
Glucose, UA: NEGATIVE mg/dL
HGB URINE DIPSTICK: NEGATIVE
Ketones, ur: NEGATIVE mg/dL
Nitrite: NEGATIVE
PROTEIN: NEGATIVE mg/dL
Specific Gravity, Urine: 1.01 (ref 1.005–1.030)
UROBILINOGEN UA: 0.2 mg/dL (ref 0.0–1.0)
pH: 5.5 (ref 5.0–8.0)

## 2014-11-23 LAB — CBC WITH DIFFERENTIAL/PLATELET
BASOS ABS: 0 10*3/uL (ref 0.0–0.1)
Basophils Relative: 1 % (ref 0–1)
Eosinophils Absolute: 0.1 10*3/uL (ref 0.0–0.7)
Eosinophils Relative: 2 % (ref 0–5)
HEMATOCRIT: 29.9 % — AB (ref 36.0–46.0)
Hemoglobin: 9.8 g/dL — ABNORMAL LOW (ref 12.0–15.0)
LYMPHS ABS: 1.6 10*3/uL (ref 0.7–4.0)
Lymphocytes Relative: 33 % (ref 12–46)
MCH: 29.6 pg (ref 26.0–34.0)
MCHC: 32.8 g/dL (ref 30.0–36.0)
MCV: 90.3 fL (ref 78.0–100.0)
Monocytes Absolute: 0.5 10*3/uL (ref 0.1–1.0)
Monocytes Relative: 11 % (ref 3–12)
NEUTROS ABS: 2.5 10*3/uL (ref 1.7–7.7)
NEUTROS PCT: 53 % (ref 43–77)
Platelets: 237 10*3/uL (ref 150–400)
RBC: 3.31 MIL/uL — ABNORMAL LOW (ref 3.87–5.11)
RDW: 13.7 % (ref 11.5–15.5)
WBC: 4.7 10*3/uL (ref 4.0–10.5)

## 2014-11-23 LAB — COMPREHENSIVE METABOLIC PANEL
ALT: 362 U/L — AB (ref 0–35)
AST: 86 U/L — ABNORMAL HIGH (ref 0–37)
Albumin: 3 g/dL — ABNORMAL LOW (ref 3.5–5.2)
Alkaline Phosphatase: 161 U/L — ABNORMAL HIGH (ref 39–117)
Anion gap: 10 (ref 5–15)
BILIRUBIN TOTAL: 1.2 mg/dL (ref 0.3–1.2)
BUN: 8 mg/dL (ref 6–23)
CHLORIDE: 105 mmol/L (ref 96–112)
CO2: 23 mmol/L (ref 19–32)
Calcium: 8.7 mg/dL (ref 8.4–10.5)
Creatinine, Ser: 1.3 mg/dL — ABNORMAL HIGH (ref 0.50–1.10)
GFR calc Af Amer: 43 mL/min — ABNORMAL LOW (ref >90)
GFR, EST NON AFRICAN AMERICAN: 37 mL/min — AB (ref >90)
Glucose, Bld: 89 mg/dL (ref 70–99)
POTASSIUM: 3.9 mmol/L (ref 3.5–5.1)
SODIUM: 138 mmol/L (ref 135–145)
Total Protein: 5.5 g/dL — ABNORMAL LOW (ref 6.0–8.3)

## 2014-11-23 LAB — URINE MICROSCOPIC-ADD ON

## 2014-11-23 MED ORDER — PANTOPRAZOLE SODIUM 40 MG PO TBEC: 40.0000 mg | DELAYED_RELEASE_TABLET | Freq: Every day | ORAL | Status: DC

## 2014-11-23 NOTE — Progress Notes (Signed)
[name] 1:16 PM  Subjective: Patient doing well from her procedure without any obvious complication and eating fine and wants to go home and her procedure was rediscussed with her and her family  Objective: Vital signs stable afebrile no acute distress abdomen is soft essentially nontender good bowel sounds liver tests continue to decrease  Assessment: Probable passed CBD stone  Plan: She will call me when necessary otherwise she has a follow-up in one week and will repeat liver tests then and she'll call me sooner when necessary and all of her questions were answered and currently she does not want a second try by a second operator and we will probably wait on another attack  [facility] E

## 2014-11-23 NOTE — Discharge Summary (Signed)
Discharge Summary  [name]:[id number] DOB: [birth year]  PCP: [name] Pry, DO  Admit date: 11/19/2014 Discharge date: 11/23/2014  Time spent: <78mins  Recommendations for Outpatient Follow-up:  1. F/u GI Dr [name] in [place], Dr. Watt [name] to repeat lft, call sooner if develop ab pain. 2. F/u with PMD [name] in [place], Dr. Shawna [name] to continue monitor blood pressure control, monitor renal function.  Discharge Diagnoses:  Active Hospital Problems   Diagnosis Date Noted  . Abdominal pain 11/20/2014  . Transaminitis 11/20/2014    Resolved Hospital Problems   Diagnosis Date Noted Date Resolved  No resolved problems to display.    Discharge Condition: stable  Diet recommendation: heart healthy  Filed Weights   11/20/14 0306 11/20/14 2112 11/22/14 2124  Weight: 77.066 kg (169 lb 14.4 oz) 77.111 kg (170 lb) 75.07 kg (165 lb 8 oz)    History of present illness:  This is a 81 y.o. year old female with significant past medical history of HTN, stage 3 CKD, s/p cholecystectomy in 1980s presenting with abd pain, transaminitis. Patient reports recurrent generalized and epigastric abdominal pain over the past day. Reports having recurring episodes symptoms for an extended period of time the patient is unsure of exact duration. Denies any fevers or chills. No nausea or vomiting. No diarrhea. No dysuria. Does admit to eating fatty foods at times still is on sure if this has made pain worsen. Present to the ER afebrile, hemodynamically stable. Satting mid 90s on room air. White blood cell count 4.9, hemoglobin 11.1, creatinine 1.2. Urinalysis minimally indicative of infection. Lipase 34. AST 2200, ALC 1437, alkaline phosphatase 165, T bili 1.7 CT of the abdomen and pelvis obtained showing intra-and extrahepatic biliary dilatation status post cholecystectomy concerning for obstruction. No visible mass or choledocholithiasis. ED physician assistant [name] discussed case with Dr. Collene [name] with  gastroenterology. Recommend inpatient admission for potential ERCP/MRCP in the morning.    Hospital Course:  Active Problems:   Abdominal pain   Transaminitis   Abd Pain/transaminitis -likely recurrent CBD stone with known periampullary diverticulum,  H/o cholecystectomy -ERCP 4/14 , received cipro perioperatively, per GI her ampulla was found inside the diverticuli at approximately 7:00 at a very difficult angle, was not able to perform regular sphincterectomy. -likely CBD stone has passed, no pain, lft trending down, tolerating diet, GI cleared patient to discharge home on 4/15 and close outpatient GI follow up.  2-HTN -BP stable on home meds.  3-CKD -Cr fluctuating from 1.04 to 1.3 in the hospital, close to baseline, ua unremarkable,  -pmd to continue monitor renal function.  Code Status:Full Code   Code Status: full  Family Communication: patient and family  Discharge to home, patient reported live independently by herself, still drives. Good family support.   Consultants:  GI  Procedures:   Ercp 4/14  Antibiotics: cipro  Perioperatively   Discharge Exam: BP 154/45 mmHg  Pulse 65  Temp(Src) 98.8 F (37.1 C) (Oral)  Resp 17  Ht 5\' 2"  (1.575 m)  Wt 75.07 kg (165 lb 8 oz)  BMI 30.26 kg/m2  SpO2 95%  General: AAOx3, NAD Cardiovascular: RRR, soft murmur at right upper sternal border, known to patient Respiratory: CTABL Ab: soft/NT/ND, positive BS. Lower extremity: no edema,  Discharge Instructions You were cared for by a hospitalist during your hospital stay. If you have any questions about your discharge medications or the care you received while you were in the hospital after you are discharged, you can call the unit  and asked to speak with the hospitalist on call if the hospitalist that took care of you is not available. Once you are discharged, your primary care physician will handle any further medical issues. Please note that NO REFILLS for any  discharge medications will be authorized once you are discharged, as it is imperative that you return to your primary care physician (or establish a relationship with a primary care physician if you do not have one) for your aftercare needs so that they can reassess your need for medications and monitor your lab values.  Discharge Instructions    Diet - low sodium heart healthy    Complete by:  As directed      Increase activity slowly    Complete by:  As directed             Medication List    TAKE these medications        amLODipine 5 MG tablet  Commonly known as:  NORVASC  Take 1.5 tablets (7.5 mg total) by mouth daily.     BENICAR 40 MG tablet  Generic drug:  olmesartan  Take 0.5 tablets (20 mg total) by mouth daily.     cetirizine 10 MG tablet  Commonly known as:  ZYRTEC  Take 10 mg by mouth daily.     clidinium-chlordiazePOXIDE 5-2.5 MG per capsule  Commonly known as:  LIBRAX  2 (two) times daily as needed.     fluticasone 50 MCG/ACT nasal spray  Commonly known as:  FLONASE  Place 2 sprays into both nostrils daily.     levothyroxine 137 MCG tablet  Commonly known as:  SYNTHROID, LEVOTHROID  TAKE 1 TABLET DAILY BEFORE BREAKFAST     multivitamin tablet  Take 1 tablet by mouth daily.     omeprazole 40 MG capsule  Commonly known as:  PRILOSEC  Take 1 tablet by mouth daily before breakfast.     potassium chloride SA 20 MEQ tablet  Commonly known as:  K-DUR,KLOR-CON  Take 1 tablet by mouth daily.     sodium chloride 0.65 % Soln nasal spray  Commonly known as:  OCEAN  Place 1 spray into both nostrils as needed for congestion.     traMADol 50 MG tablet  Commonly known as:  ULTRAM  Take 0.5 tablets (25 mg total) by mouth every 12 (twelve) hours as needed (Do not take near bedtime).     zolpidem 5 MG tablet  Commonly known as:  AMBIEN  Take 1 tablet (5 mg total) by mouth at bedtime as needed for sleep.       Allergies  Allergen Reactions  . Oxycodone Hcl      REACTION: itching       Follow-up Information    Follow up with [facility], MD In 79 weeks.   Specialty:  Gastroenterology   Why:  Dr [name] to repeat lft, call sooner if ab pain/n/v.   Contact information:   [address] [place] [address] [phone]       Follow up with [name] Pry, DO In 2 weeks.   Specialty:  Internal Medicine   Why:  post hospital follow up   Contact information:   [place] [address] [phone]        The results of significant diagnostics from this hospitalization (including imaging, microbiology, ancillary and laboratory) are listed below for reference.    Significant Diagnostic Studies: Dg Chest 2 View  11/19/2014   CLINICAL  DATA:  Epigastric pain.  EXAM: CHEST  2 VIEW  COMPARISON:  10/09/2014  FINDINGS: The cardiomediastinal contours are unchanged, heart remains at the upper limits of normal, atherosclerosis noted of the thoracic aorta. Unchanged calcified granuloma in the right upper lung. Pulmonary vasculature is normal. No consolidation, pleural effusion, or pneumothorax. No acute osseous abnormalities are seen.  IMPRESSION: No acute pulmonary process.   Electronically Signed   By: Jeb Levering M.D.   On: 11/19/2014 21:58   Ct Abdomen Pelvis W Contrast  11/20/2014   CLINICAL DATA:  Severe epigastric pain starting today at 11.  EXAM: CT ABDOMEN AND PELVIS WITH CONTRAST  TECHNIQUE: Multidetector CT imaging of the abdomen and pelvis was performed using the standard protocol following bolus administration of intravenous contrast.  CONTRAST:  47mL OMNIPAQUE IOHEXOL 300 MG/ML  SOLN  COMPARISON:  None.  FINDINGS: BODY WALL: No contributory findings.  LOWER CHEST: Extensive coronary atherosclerosis.  ABDOMEN/PELVIS:  Liver: No focal abnormality.  Biliary: Cholecystectomy. There is intra and extrahepatic biliary dilatation with the common bile duct measuring up to 16 mm. The duct is smoothly tapering and there  is no calcified choledocholithiasis.  Pancreas: Unremarkable.  Spleen: Unremarkable.  Adrenals: Unremarkable.  Kidneys and ureters: No hydronephrosis or stone.  Bladder: Unremarkable.  Reproductive: Hysterectomy and probable oophorectomies. Pelvic floor laxity.  Bowel: Widespread colonic diverticulosis. No bowel obstruction or wall thickening. Large but uncomplicated mid duodenal diverticulum. No indication of appendicitis.  Retroperitoneum: No mass or adenopathy.  Peritoneum: No ascites or pneumoperitoneum.  Vascular: No acute abnormality. Densely calcified 14 mm splenic artery aneurysm.  OSSEOUS: No acute abnormalities.  IMPRESSION: Intra and extrahepatic biliary dilatation. Although post cholecystectomy, findings are concerning for obstruction given the liver function tests. There is no visible mass or choledocholithiasis.   Electronically Signed   By: Monte Fantasia M.D.   On: 11/20/2014 01:02   Mr Abdomen Mrcp Wo Cm  11/20/2014   CLINICAL DATA:  Biliary dilatation, status post cholecystectomy, elevated LFTs  EXAM: MRI ABDOMEN WITHOUT CONTRAST  (INCLUDING MRCP)  TECHNIQUE: Multiplanar multisequence MR imaging of the abdomen was performed. Heavily T2-weighted images of the biliary and pancreatic ducts were obtained, and three-dimensional MRCP images were rendered by post processing.  COMPARISON:  CT abdomen pelvis dated 11/19/2014  FINDINGS: Motion degraded images.  Lower chest:  Lung bases are clear.  Hepatobiliary: Liver is notable for mild hepatic steatosis. No focal hepatic lesion is seen.  Status post cholecystectomy. Mild central intrahepatic ductal dilatation, left greater than right.  Dilated common duct, measuring 18 mm proximally and smoothly tapering to 9 mm distally.  No choledocholithiasis is visualized. A distal CBD stricture is not excluded, but an obstructing mass is not visualized on unenhanced MRI.  Pancreas: Within normal limits.  Spleen: Within normal limits.  Adrenals/Urinary Tract:  Adrenal glands are within normal limits.  7 mm anterior right lower pole renal cyst (series 7/ image 31). No hydronephrosis.  Stomach/Bowel: Proximal gastric wall is mildly thick-walled although underdistended.  Moderate duodenal diverticulum adjacent to the ampulla (series 7/ image 22).  Colonic diverticulosis.  Vascular/Lymphatic: No evidence of abdominal aortic aneurysm.  No suspicious abdominal lymphadenopathy.  Other: No abdominal ascites.  Musculoskeletal: No focal osseous lesions.  IMPRESSION: Motion degraded images.  Status post cholecystectomy. Mild central intrahepatic ductal dilatation. Dilated common duct, measuring 18 mm proximally and smoothly tapering to 9 mm distally.  No choledocholithiasis is seen. Distal CBD stricture is not excluded. No obstructing mass is visualized on unenhanced MRI.  Moderate  duodenum diverticulum adjacent to the ampulla.   Electronically Signed   By: Julian Hy M.D.   On: 11/20/2014 18:31   Mr 3d Recon At Scanner  11/20/2014   CLINICAL DATA:  Biliary dilatation, status post cholecystectomy, elevated LFTs  EXAM: MRI ABDOMEN WITHOUT CONTRAST  (INCLUDING MRCP)  TECHNIQUE: Multiplanar multisequence MR imaging of the abdomen was performed. Heavily T2-weighted images of the biliary and pancreatic ducts were obtained, and three-dimensional MRCP images were rendered by post processing.  COMPARISON:  CT abdomen pelvis dated 11/19/2014  FINDINGS: Motion degraded images.  Lower chest:  Lung bases are clear.  Hepatobiliary: Liver is notable for mild hepatic steatosis. No focal hepatic lesion is seen.  Status post cholecystectomy. Mild central intrahepatic ductal dilatation, left greater than right.  Dilated common duct, measuring 18 mm proximally and smoothly tapering to 9 mm distally.  No choledocholithiasis is visualized. A distal CBD stricture is not excluded, but an obstructing mass is not visualized on unenhanced MRI.  Pancreas: Within normal limits.  Spleen: Within normal  limits.  Adrenals/Urinary Tract: Adrenal glands are within normal limits.  7 mm anterior right lower pole renal cyst (series 7/ image 31). No hydronephrosis.  Stomach/Bowel: Proximal gastric wall is mildly thick-walled although underdistended.  Moderate duodenal diverticulum adjacent to the ampulla (series 7/ image 22).  Colonic diverticulosis.  Vascular/Lymphatic: No evidence of abdominal aortic aneurysm.  No suspicious abdominal lymphadenopathy.  Other: No abdominal ascites.  Musculoskeletal: No focal osseous lesions.  IMPRESSION: Motion degraded images.  Status post cholecystectomy. Mild central intrahepatic ductal dilatation. Dilated common duct, measuring 18 mm proximally and smoothly tapering to 9 mm distally.  No choledocholithiasis is seen. Distal CBD stricture is not excluded. No obstructing mass is visualized on unenhanced MRI.  Moderate duodenum diverticulum adjacent to the ampulla.   Electronically Signed   By: Julian Hy M.D.   On: 11/20/2014 18:31   Dg C-arm Gt 120 Min-no Report  11/22/2014   CLINICAL DATA: common bile duct blockage   C-ARM GT 120 MINUTE  Fluoroscopy was utilized by the requesting physician.  No radiographic  interpretation.     Microbiology: No results found for this or any previous visit (from the past 240 hour(s)).   Labs: Basic Metabolic Panel:  Recent Labs Lab 11/19/14 2057 11/20/14 0600 11/21/14 0457 11/22/14 0620 11/23/14 0504  NA 134* 131* 132* 137 138  K 4.3 4.5 4.3 4.1 3.9  CL 101 98 100 104 105  CO2 20 27 20 24 23   GLUCOSE 116* 112* 84 99 89  BUN 16 14 10 7 8   CREATININE 1.20* 1.16* 1.06 1.04 1.30*  CALCIUM 9.3 9.0 8.8 9.3 8.7   Liver Function Tests:  Recent Labs Lab 11/19/14 2057 11/20/14 0600 11/21/14 0457 11/22/14 0620 11/23/14 0504  AST 2220* 1136* 441* 175* 86*  ALT 1437* 1263* 827* 564* 362*  ALKPHOS 165* 164* 172* 194* 161*  BILITOT 1.7* 2.5* 3.6* 1.4* 1.2  PROT 6.8 6.1 5.8* 5.9* 5.5*  ALBUMIN 4.1 3.4* 3.2* 3.3* 3.0*     Recent Labs Lab 11/19/14 2059  LIPASE 34   No results for input(s): AMMONIA in the last 168 hours. CBC:  Recent Labs Lab 11/19/14 2057 11/20/14 0600 11/21/14 0457 11/22/14 0620 11/23/14 0504  WBC 4.9 4.0 3.9* 4.7 4.7  NEUTROABS 3.6 2.7 2.1 2.8 2.5  HGB 11.1* 10.1* 10.7* 10.4* 9.8*  HCT 33.3* 30.4* 32.4* 31.3* 29.9*  MCV 89.3 88.4 89.5 89.2 90.3  PLT 261 255 253 235 237  Cardiac Enzymes: No results for input(s): CKTOTAL, CKMB, CKMBINDEX, TROPONINI in the last 168 hours. BNP: BNP (last 3 results) No results for input(s): BNP in the last 8760 hours.  ProBNP (last 3 results)  Recent Labs  11/28/13 1353  PROBNP 49.0    CBG: No results for input(s): GLUCAP in the last 168 hours.     SignedFlorencia Reasons MD, PhD  [facility] 11/23/2014, 2:02 PM

## 2014-11-23 NOTE — Progress Notes (Signed)
Patient given discharge instructions with family at bedside. Will follow up with primary care and GI doctor within 2 weeks.  All belongings taken with patient.  Patient taken down in wheelchair with Sula Soda at side.

## 2014-12-03 ENCOUNTER — Ambulatory Visit: Payer: Medicare Other | Admitting: Internal Medicine

## 2014-12-07 DIAGNOSIS — D508 Other iron deficiency anemias: Secondary | ICD-10-CM | POA: Diagnosis not present

## 2014-12-07 DIAGNOSIS — R109 Unspecified abdominal pain: Secondary | ICD-10-CM | POA: Diagnosis not present

## 2014-12-07 DIAGNOSIS — R740 Nonspecific elevation of levels of transaminase and lactic acid dehydrogenase [LDH]: Secondary | ICD-10-CM | POA: Diagnosis not present

## 2014-12-21 ENCOUNTER — Ambulatory Visit: Payer: Medicare Other | Admitting: Internal Medicine

## 2015-01-28 DIAGNOSIS — R109 Unspecified abdominal pain: Secondary | ICD-10-CM | POA: Diagnosis not present

## 2015-01-28 DIAGNOSIS — R740 Nonspecific elevation of levels of transaminase and lactic acid dehydrogenase [LDH]: Secondary | ICD-10-CM | POA: Diagnosis not present

## 2015-01-30 ENCOUNTER — Other Ambulatory Visit: Payer: Medicare Other

## 2015-01-30 ENCOUNTER — Other Ambulatory Visit: Payer: Self-pay | Admitting: Internal Medicine

## 2015-01-30 ENCOUNTER — Other Ambulatory Visit (INDEPENDENT_AMBULATORY_CARE_PROVIDER_SITE_OTHER): Payer: Medicare Other

## 2015-01-30 DIAGNOSIS — I10 Essential (primary) hypertension: Secondary | ICD-10-CM | POA: Diagnosis not present

## 2015-01-30 DIAGNOSIS — E871 Hypo-osmolality and hyponatremia: Secondary | ICD-10-CM | POA: Diagnosis not present

## 2015-01-30 DIAGNOSIS — Z961 Presence of intraocular lens: Secondary | ICD-10-CM | POA: Diagnosis not present

## 2015-01-30 LAB — BASIC METABOLIC PANEL
BUN: 15 mg/dL (ref 6–23)
CALCIUM: 9.3 mg/dL (ref 8.4–10.5)
CO2: 27 mEq/L (ref 19–32)
CREATININE: 1.21 mg/dL — AB (ref 0.40–1.20)
Chloride: 103 mEq/L (ref 96–112)
GFR: 45.09 mL/min — AB (ref >60.00)
Glucose, Bld: 117 mg/dL — ABNORMAL HIGH (ref 70–99)
Potassium: 4.5 mEq/L (ref 3.5–5.1)
SODIUM: 135 meq/L (ref 135–145)

## 2015-02-04 ENCOUNTER — Ambulatory Visit (INDEPENDENT_AMBULATORY_CARE_PROVIDER_SITE_OTHER): Payer: Medicare Other | Admitting: Adult Health

## 2015-02-04 ENCOUNTER — Encounter: Payer: Self-pay | Admitting: Adult Health

## 2015-02-04 ENCOUNTER — Ambulatory Visit: Payer: Medicare Other | Admitting: Internal Medicine

## 2015-02-04 VITALS — BP 128/62 | Temp 98.4°F | Ht 62.0 in | Wt 169.4 lb

## 2015-02-04 DIAGNOSIS — I10 Essential (primary) hypertension: Secondary | ICD-10-CM | POA: Diagnosis not present

## 2015-02-04 DIAGNOSIS — N289 Disorder of kidney and ureter, unspecified: Secondary | ICD-10-CM

## 2015-02-04 NOTE — Progress Notes (Signed)
Pre visit review using our clinic review tool, if applicable. No additional management support is needed unless otherwise documented below in the visit note. 

## 2015-02-04 NOTE — Progress Notes (Signed)
Subjective:    Patient ID: [name], female    DOB: November 29, 1928, 81 y.o.   MRN: [id number]  HPI  Patient presents to the office today for follow up regarding her kidney function and blood pressure. She is usually seen by Dr. Shawna Orleans, but I am seeing her in his absence.  Her home blood pressures have been between 892-119 systolic and 41-74 diastolic.   She was in the hospital on 4/11 - 4/15 for gallstones.   She is worried about her kidney function and is wondering if it has improved since her last visit.   Review of Systems  Constitutional: Negative.   HENT: Negative.   Respiratory: Negative.   Cardiovascular: Negative.   Gastrointestinal: Positive for abdominal pain (from gallstones).  Endocrine: Negative.   Genitourinary: Negative.   Musculoskeletal: Positive for myalgias and back pain (lower back ).  Neurological: Negative.   Psychiatric/Behavioral: Negative.   All other systems reviewed and are negative.  Past Medical History  Diagnosis Date  . HYPOTHYROIDISM 04/04/2007  . HYPERTENSION 04/04/2007  . Chronic fatigue   . Depression   . Anemia   . Chronic renal insufficiency, stage III (moderate)     History   Social History  . Marital Status: Widowed    Spouse Name: N/A  . Number of Children: N/A  . Years of Education: N/A   Occupational History  . Retired    Social History Main Topics  . Smoking status: Former Smoker -- 1.00 packs/day for 7 years    Types: Cigarettes    Quit date: 10/13/1969  . Smokeless tobacco: Not on file  . Alcohol Use: No  . Drug Use: No  . Sexual Activity: Not on file   Other Topics Concern  . Not on file   Social History Narrative    Past Surgical History  Procedure Laterality Date  . Appendectomy    . Cholecystectomy    . Hernia repair    . Abdominal hysterectomy    . Spine surgery  2011    cervical  . Oophorectomy    . Back surgery  2012  . Orif tibia fracture  2012    dr duda  . Ercp N/A 11/22/2014    Procedure:  ENDOSCOPIC RETROGRADE CHOLANGIOPANCREATOGRAPHY (ERCP);  Surgeon: Clarene Essex, MD;  Location: [facility] ENDOSCOPY;  Service: Endoscopy;  Laterality: N/A;    Family History  Problem Relation Age of Onset  . Heart disease Mother   . Cancer Mother 59    renal cell CA  . Lymphoma Son   . Heart disease Father     Allergies  Allergen Reactions  . Oxycodone Hcl     REACTION: itching    Current Outpatient Prescriptions on File Prior to Visit  Medication Sig Dispense Refill  . amLODipine (NORVASC) 5 MG tablet Take 1.5 tablets (7.5 mg total) by mouth daily. 135 tablet 1  . BENICAR 40 MG tablet TAKE 1 TABLET DAILY 90 tablet 0  . cetirizine (ZYRTEC) 10 MG tablet Take 10 mg by mouth daily.    . clidinium-chlordiazePOXIDE (LIBRAX) 2.5-5 MG per capsule 2 (two) times daily as needed.     . fluticasone (FLONASE) 50 MCG/ACT nasal spray Place 2 sprays into both nostrils daily. 16 g 5  . levothyroxine (SYNTHROID, LEVOTHROID) 137 MCG tablet TAKE 1 TABLET DAILY BEFORE BREAKFAST 90 tablet 2  . Multiple Vitamin (MULTIVITAMIN) tablet Take 1 tablet by mouth daily.    Marland Kitchen omeprazole (PRILOSEC) 40 MG capsule Take 1 tablet by  mouth daily before breakfast.     . potassium chloride SA (K-DUR,KLOR-CON) 20 MEQ tablet Take 1 tablet by mouth daily.    . sodium chloride (OCEAN) 0.65 % SOLN nasal spray Place 1 spray into both nostrils as needed for congestion.    . traMADol (ULTRAM) 50 MG tablet Take 0.5 tablets (25 mg total) by mouth every 12 (twelve) hours as needed (Do not take near bedtime). 30 tablet 1  . zolpidem (AMBIEN) 5 MG tablet Take 1 tablet (5 mg total) by mouth at bedtime as needed for sleep. 30 tablet 3   No current facility-administered medications on file prior to visit.    BP 128/62 mmHg  Temp(Src) 98.4 F (36.9 C) (Oral)  Ht 5\' 2"  (1.575 m)  Wt 169 lb 6.4 oz (76.839 kg)  BMI 30.98 kg/m2        Objective:   Physical Exam  Constitutional: She is oriented to person, place, and time. She appears  well-developed and well-nourished. No distress.  Cardiovascular: Normal rate, regular rhythm, normal heart sounds and intact distal pulses.  Exam reveals no gallop and no friction rub.   No murmur heard. Pulmonary/Chest: Effort normal and breath sounds normal. No respiratory distress. She has no wheezes. She has no rales. She exhibits no tenderness.  Abdominal: Soft. Bowel sounds are normal. She exhibits no distension and no mass. There is no tenderness. There is no rebound and no guarding.  Musculoskeletal: Normal range of motion. She exhibits no edema or tenderness.  Neurological: She is alert and oriented to person, place, and time.  Skin: Skin is warm and dry. No rash noted. She is not diaphoretic. No erythema. No pallor.  Psychiatric: She has a normal mood and affect. Her behavior is normal. Judgment and thought content normal.  Nursing note and vitals reviewed.      Assessment & Plan:  1. Essential hypertension - Her blood pressures are ideal for her age. No change in medication - Basic metabolic panel; Future  2. Renal insufficiency - Her renal function continues to slowly improve - Basic metabolic panel; Future 1.5 months - Follow up as needed

## 2015-02-05 ENCOUNTER — Telehealth: Payer: Self-pay | Admitting: Internal Medicine

## 2015-02-05 NOTE — Telephone Encounter (Signed)
[facility] will fax over form for alternative to benicar. benicar is not covered

## 2015-03-06 ENCOUNTER — Telehealth: Payer: Self-pay | Admitting: Internal Medicine

## 2015-03-06 DIAGNOSIS — D649 Anemia, unspecified: Secondary | ICD-10-CM

## 2015-03-06 DIAGNOSIS — N289 Disorder of kidney and ureter, unspecified: Secondary | ICD-10-CM

## 2015-03-06 DIAGNOSIS — E538 Deficiency of other specified B group vitamins: Secondary | ICD-10-CM

## 2015-03-06 NOTE — Telephone Encounter (Signed)
Pt is asking if she need to have any labs drawn before she see Dr Shawna Orleans on 03/11/15

## 2015-03-07 DIAGNOSIS — D649 Anemia, unspecified: Secondary | ICD-10-CM | POA: Insufficient documentation

## 2015-03-07 NOTE — Telephone Encounter (Signed)
Labs ordered.  Left message on machine for patient to call back and schedule a lab appointment.

## 2015-03-07 NOTE — Telephone Encounter (Signed)
Pt has been scheduled.  °

## 2015-03-07 NOTE — Telephone Encounter (Signed)
Yes.  BMET - renal insuff  CBCD, TIBC panel, ferritin level, vitamin b12, erythropoietin level - anemia

## 2015-03-08 ENCOUNTER — Other Ambulatory Visit (INDEPENDENT_AMBULATORY_CARE_PROVIDER_SITE_OTHER): Payer: Medicare Other

## 2015-03-08 ENCOUNTER — Ambulatory Visit: Payer: Medicare Other | Admitting: Adult Health

## 2015-03-08 DIAGNOSIS — E538 Deficiency of other specified B group vitamins: Secondary | ICD-10-CM | POA: Diagnosis not present

## 2015-03-08 DIAGNOSIS — D649 Anemia, unspecified: Secondary | ICD-10-CM

## 2015-03-08 DIAGNOSIS — N289 Disorder of kidney and ureter, unspecified: Secondary | ICD-10-CM | POA: Diagnosis not present

## 2015-03-08 DIAGNOSIS — I10 Essential (primary) hypertension: Secondary | ICD-10-CM

## 2015-03-08 LAB — BASIC METABOLIC PANEL
BUN: 14 mg/dL (ref 6–23)
CO2: 25 mEq/L (ref 19–32)
Calcium: 8.8 mg/dL (ref 8.4–10.5)
Chloride: 104 mEq/L (ref 96–112)
Creatinine, Ser: 1.12 mg/dL (ref 0.40–1.20)
GFR: 49.28 mL/min — AB (ref >60.00)
Glucose, Bld: 115 mg/dL — ABNORMAL HIGH (ref 70–99)
Potassium: 4.2 mEq/L (ref 3.5–5.1)
Sodium: 136 mEq/L (ref 135–145)

## 2015-03-08 LAB — CBC WITH DIFFERENTIAL/PLATELET
BASOS ABS: 0.1 10*3/uL (ref 0.0–0.1)
BASOS PCT: 1.5 % (ref 0.0–3.0)
Eosinophils Absolute: 0.3 10*3/uL (ref 0.0–0.7)
Eosinophils Relative: 5 % (ref 0.0–5.0)
HCT: 33 % — ABNORMAL LOW (ref 36.0–46.0)
Hemoglobin: 11.3 g/dL — ABNORMAL LOW (ref 12.0–15.0)
LYMPHS PCT: 23.7 % (ref 12.0–46.0)
Lymphs Abs: 1.4 10*3/uL (ref 0.7–4.0)
MCHC: 34.2 g/dL (ref 30.0–36.0)
MCV: 90.3 fl (ref 78.0–100.0)
MONOS PCT: 8.7 % (ref 3.0–12.0)
Monocytes Absolute: 0.5 10*3/uL (ref 0.1–1.0)
NEUTROS PCT: 61.1 % (ref 43.0–77.0)
Neutro Abs: 3.6 10*3/uL (ref 1.4–7.7)
PLATELETS: 292 10*3/uL (ref 150.0–400.0)
RBC: 3.65 Mil/uL — ABNORMAL LOW (ref 3.87–5.11)
RDW: 13.1 % (ref 11.5–15.5)
WBC: 6 10*3/uL (ref 4.0–10.5)

## 2015-03-08 LAB — IBC PANEL
Iron: 63 ug/dL (ref 42–145)
Saturation Ratios: 20.4 % (ref 20.0–50.0)
TRANSFERRIN: 221 mg/dL (ref 212.0–360.0)

## 2015-03-08 LAB — VITAMIN B12: Vitamin B-12: 580 pg/mL (ref 211–911)

## 2015-03-08 LAB — IRON: IRON: 63 ug/dL (ref 42–145)

## 2015-03-11 ENCOUNTER — Encounter: Payer: Self-pay | Admitting: Internal Medicine

## 2015-03-11 ENCOUNTER — Ambulatory Visit (INDEPENDENT_AMBULATORY_CARE_PROVIDER_SITE_OTHER): Payer: Medicare Other | Admitting: Internal Medicine

## 2015-03-11 VITALS — BP 120/70 | HR 68 | Temp 98.3°F | Wt 170.0 lb

## 2015-03-11 DIAGNOSIS — D649 Anemia, unspecified: Secondary | ICD-10-CM

## 2015-03-11 DIAGNOSIS — R413 Other amnesia: Secondary | ICD-10-CM | POA: Diagnosis not present

## 2015-03-11 DIAGNOSIS — N289 Disorder of kidney and ureter, unspecified: Secondary | ICD-10-CM | POA: Diagnosis not present

## 2015-03-11 DIAGNOSIS — I10 Essential (primary) hypertension: Secondary | ICD-10-CM

## 2015-03-11 MED ORDER — AMLODIPINE BESYLATE 5 MG PO TABS: 7.5000 mg | ORAL_TABLET | Freq: Every day | ORAL | Status: DC

## 2015-03-11 MED ORDER — OLMESARTAN MEDOXOMIL 40 MG PO TABS: 40.0000 mg | ORAL_TABLET | Freq: Every day | ORAL | Status: DC

## 2015-03-11 NOTE — Progress Notes (Signed)
Pre visit review using our clinic review tool, if applicable. No additional management support is needed unless otherwise documented below in the visit note. 

## 2015-03-11 NOTE — Progress Notes (Signed)
Subjective:    Patient ID: [name], female    DOB: May 05, 1929, 81 y.o.   MRN: [id number]  HPI  81 year old white female with history of hypertension, hypothyroidism and renal insufficiency for follow-up. Patient denies any significant interval medical history. Patient complains of worsening short-term memory loss. Patient often forgets names and finds it difficult to remember conversations with friends.  Patient still able to manage her own finances.  Her sister has had alzheimer's for 20 years.  Patient has mild chronic normocytic anemia.  Recent hemoglobin on 03/08/2015 was 11.3. Patient reports she has had mild anemia all her life. Her iron testing and B12 level were normal.  Chronic renal insufficiency-serum creatinine continues to improve. It is now 1.12. Her hyponatremia also improved - 136.  Hypertension - stable  GERD - We discussed potential side effects of omeprazole. She tried to taper off and switch to ranitidine in the past. However patient never made transition because she was hospitalized in April, 2016 for possible symptomatic bile duct stone.  She is taking Librax as prescribed by her GI specialist.  She takes it in the evening.  It seems to also help her sleep.  Chronic intermittent cough.  It seems to be worse in the AM.  GERD symptoms well controlled.  Review of Systems Memory loss, no shortness of breath, no fatigue    Past Medical History  Diagnosis Date  . HYPOTHYROIDISM 04/04/2007  . HYPERTENSION 04/04/2007  . Chronic fatigue   . Depression   . Anemia   . Chronic renal insufficiency, stage III (moderate)     History   Social History  . Marital Status: Widowed    Spouse Name: N/A  . Number of Children: N/A  . Years of Education: N/A   Occupational History  . Retired    Social History Main Topics  . Smoking status: Former Smoker -- 1.00 packs/day for 7 years    Types: Cigarettes    Quit date: 10/13/1969  . Smokeless tobacco: Not on file  .  Alcohol Use: No  . Drug Use: No  . Sexual Activity: Not on file   Other Topics Concern  . Not on file   Social History Narrative    Past Surgical History  Procedure Laterality Date  . Appendectomy    . Cholecystectomy    . Hernia repair    . Abdominal hysterectomy    . Spine surgery  2011    cervical  . Oophorectomy    . Back surgery  2012  . Orif tibia fracture  2012    dr duda  . Ercp N/A 11/22/2014    Procedure: ENDOSCOPIC RETROGRADE CHOLANGIOPANCREATOGRAPHY (ERCP);  Surgeon: Clarene Essex, MD;  Location: [facility] ENDOSCOPY;  Service: Endoscopy;  Laterality: N/A;    Family History  Problem Relation Age of Onset  . Heart disease Mother   . Cancer Mother 44    renal cell CA  . Lymphoma Son   . Heart disease Father     Allergies  Allergen Reactions  . Oxycodone Hcl     REACTION: itching    Current Outpatient Prescriptions on File Prior to Visit  Medication Sig Dispense Refill  . amLODipine (NORVASC) 5 MG tablet Take 1.5 tablets (7.5 mg total) by mouth daily. 135 tablet 1  . BENICAR 40 MG tablet TAKE 1 TABLET DAILY 90 tablet 0  . cetirizine (ZYRTEC) 10 MG tablet Take 10 mg by mouth daily.    . clidinium-chlordiazePOXIDE (LIBRAX) 2.5-5 MG per capsule  2 (two) times daily as needed.     Marland Kitchen levothyroxine (SYNTHROID, LEVOTHROID) 137 MCG tablet TAKE 1 TABLET DAILY BEFORE BREAKFAST 90 tablet 2  . Multiple Vitamin (MULTIVITAMIN) tablet Take 1 tablet by mouth daily.    Marland Kitchen omeprazole (PRILOSEC) 40 MG capsule Take 1 tablet by mouth daily before breakfast.     . fluticasone (FLONASE) 50 MCG/ACT nasal spray Place 2 sprays into both nostrils daily. (Patient not taking: Reported on 03/11/2015) 16 g 5  . potassium chloride SA (K-DUR,KLOR-CON) 20 MEQ tablet Take 1 tablet by mouth daily.    . traMADol (ULTRAM) 50 MG tablet Take 0.5 tablets (25 mg total) by mouth every 12 (twelve) hours as needed (Do not take near bedtime). (Patient not taking: Reported on 03/11/2015) 30 tablet 1   No current  facility-administered medications on file prior to visit.    BP 120/70 mmHg  Pulse 68  Temp(Src) 98.3 F (36.8 C) (Oral)  Wt 170 lb (77.111 kg)    Objective:   Physical Exam  Constitutional: She is oriented to person, place, and time. She appears well-developed and well-nourished.  HENT:  Head: Normocephalic and atraumatic.  Eyes: EOM are normal. Pupils are equal, round, and reactive to light.  Cardiovascular: Normal rate, regular rhythm and normal heart sounds.   No murmur heard. Pulmonary/Chest: Effort normal and breath sounds normal. She has no wheezes.  Musculoskeletal: She exhibits no edema.  Neurological: She is alert and oriented to person, place, and time. No cranial nerve deficit.  Occasional difficulty with word finding, otherwise speech is normal  Skin: Skin is warm and dry.  Psychiatric: She has a normal mood and affect. Her behavior is normal.        Assessment & Plan:   Assessment:  1. Memory loss 2. Hypertension 3. Hyponatremia 4. Renal insufficiency 5. Hypothyroidism 6. Chronic GERD - patient has concerns about long term use of PPI   Plan:  Patient reports difficulty remembering names and conversation getting worse.  However, she is still able to manage her own finances.  Patient has been reluctant to start Aricept in the past. Patient's B12 and TSH are normal. Refer for neuropsychiatric testing.  Patient's blood pressure is well-controlled. No change in medication regimen. Her hyponatremia has resolved.  Patient's renal function also improved. Avoid diuretics in the future.  Continue same dose of levothyroxine  Patient will try switching to ranitidine 150 mg twice daily and stop omeprazole. She understands to restart PPI if she has refractory reflux symptoms.

## 2015-03-11 NOTE — Patient Instructions (Signed)
You can try stopping omeprazole by switching to over the counter ranitidine (Zantac) 150 mg twice daily Use over the counter nasal saline to see if it helps your cough Our office will contact you regarding referral to specialist to help evaluate memory loss

## 2015-04-02 DIAGNOSIS — H02839 Dermatochalasis of unspecified eye, unspecified eyelid: Secondary | ICD-10-CM | POA: Diagnosis not present

## 2015-04-02 DIAGNOSIS — Z961 Presence of intraocular lens: Secondary | ICD-10-CM | POA: Diagnosis not present

## 2015-04-02 DIAGNOSIS — H18411 Arcus senilis, right eye: Secondary | ICD-10-CM | POA: Diagnosis not present

## 2015-04-02 DIAGNOSIS — H2512 Age-related nuclear cataract, left eye: Secondary | ICD-10-CM | POA: Diagnosis not present

## 2015-04-02 DIAGNOSIS — H3531 Nonexudative age-related macular degeneration: Secondary | ICD-10-CM | POA: Diagnosis not present

## 2015-04-09 DIAGNOSIS — D3131 Benign neoplasm of right choroid: Secondary | ICD-10-CM | POA: Diagnosis not present

## 2015-05-10 DIAGNOSIS — H25012 Cortical age-related cataract, left eye: Secondary | ICD-10-CM | POA: Diagnosis not present

## 2015-05-10 DIAGNOSIS — H2512 Age-related nuclear cataract, left eye: Secondary | ICD-10-CM | POA: Diagnosis not present

## 2015-05-10 DIAGNOSIS — H25812 Combined forms of age-related cataract, left eye: Secondary | ICD-10-CM | POA: Diagnosis not present

## 2015-05-10 DIAGNOSIS — H25042 Posterior subcapsular polar age-related cataract, left eye: Secondary | ICD-10-CM | POA: Diagnosis not present

## 2015-06-27 ENCOUNTER — Telehealth: Payer: Self-pay | Admitting: Internal Medicine

## 2015-06-27 ENCOUNTER — Ambulatory Visit: Payer: Medicare Other | Admitting: Family Medicine

## 2015-06-27 NOTE — Telephone Encounter (Signed)
Patient requested an appointment with Dr Elease Hashimoto.  Appointment made.

## 2015-06-27 NOTE — Telephone Encounter (Signed)
Pt has history of difficult urinating. Pt does not want to see md unless dr Shawna Orleans advise. Pt is aware md out of office. Pt would like dr Shawna Orleans to order blood work prior to any appointment. Pt said her creatinine level was 3. Can I sch labs etc?

## 2015-06-28 ENCOUNTER — Ambulatory Visit: Payer: Medicare Other | Admitting: Family Medicine

## 2015-07-12 ENCOUNTER — Other Ambulatory Visit: Payer: Self-pay

## 2015-07-12 DIAGNOSIS — Z1231 Encounter for screening mammogram for malignant neoplasm of breast: Secondary | ICD-10-CM

## 2015-07-12 DIAGNOSIS — R5381 Other malaise: Secondary | ICD-10-CM

## 2015-07-15 DIAGNOSIS — Z23 Encounter for immunization: Secondary | ICD-10-CM | POA: Diagnosis not present

## 2015-07-17 ENCOUNTER — Ambulatory Visit: Payer: Medicare Other | Admitting: Family Medicine

## 2015-07-22 ENCOUNTER — Other Ambulatory Visit: Payer: Self-pay | Admitting: Gastroenterology

## 2015-07-22 DIAGNOSIS — R740 Nonspecific elevation of levels of transaminase and lactic acid dehydrogenase [LDH]: Secondary | ICD-10-CM | POA: Diagnosis not present

## 2015-07-22 DIAGNOSIS — N6320 Unspecified lump in the left breast, unspecified quadrant: Secondary | ICD-10-CM

## 2015-07-22 DIAGNOSIS — R109 Unspecified abdominal pain: Secondary | ICD-10-CM | POA: Diagnosis not present

## 2015-07-25 ENCOUNTER — Ambulatory Visit
Admission: RE | Admit: 2015-07-25 | Discharge: 2015-07-25 | Disposition: A | Discharge status: Home / Self Care | Payer: Medicare Other | Source: Ambulatory Visit | Attending: Gastroenterology | Admitting: Gastroenterology

## 2015-07-25 DIAGNOSIS — N6320 Unspecified lump in the left breast, unspecified quadrant: Secondary | ICD-10-CM

## 2015-07-25 DIAGNOSIS — R928 Other abnormal and inconclusive findings on diagnostic imaging of breast: Secondary | ICD-10-CM | POA: Diagnosis not present

## 2015-07-25 DIAGNOSIS — N6489 Other specified disorders of breast: Secondary | ICD-10-CM | POA: Diagnosis not present

## 2015-07-25 LAB — HM MAMMOGRAPHY

## 2015-07-29 DIAGNOSIS — M1712 Unilateral primary osteoarthritis, left knee: Secondary | ICD-10-CM | POA: Diagnosis not present

## 2015-07-29 DIAGNOSIS — M25571 Pain in right ankle and joints of right foot: Secondary | ICD-10-CM | POA: Diagnosis not present

## 2015-07-30 ENCOUNTER — Encounter: Payer: Self-pay | Admitting: Internal Medicine

## 2015-08-18 DIAGNOSIS — J40 Bronchitis, not specified as acute or chronic: Secondary | ICD-10-CM | POA: Diagnosis not present

## 2015-08-18 DIAGNOSIS — Z981 Arthrodesis status: Secondary | ICD-10-CM | POA: Diagnosis not present

## 2015-08-18 DIAGNOSIS — R0989 Other specified symptoms and signs involving the circulatory and respiratory systems: Secondary | ICD-10-CM | POA: Diagnosis not present

## 2015-08-18 DIAGNOSIS — R05 Cough: Secondary | ICD-10-CM | POA: Diagnosis not present

## 2015-09-05 ENCOUNTER — Telehealth: Payer: Self-pay | Admitting: Internal Medicine

## 2015-09-05 DIAGNOSIS — R5382 Chronic fatigue, unspecified: Secondary | ICD-10-CM

## 2015-09-05 NOTE — Telephone Encounter (Signed)
Labs were scanned into the chart.  Labs ordered and an appointment made.

## 2015-09-05 NOTE — Telephone Encounter (Signed)
Pt states dr Watt Climes did labs in pt in Dec. 2016  Pt has appointment 2/03 with Dr Shawna Orleans and wants her to repeat any of these labs prior to her appointment.

## 2015-09-05 NOTE — Telephone Encounter (Signed)
Please call dr Perley Jain office to find out which labs were done.  Get copy of labs from dr. Watt Climes and place order for same labs next week

## 2015-09-10 ENCOUNTER — Telehealth: Payer: Self-pay | Admitting: Internal Medicine

## 2015-09-10 NOTE — Telephone Encounter (Signed)
Pt had appointment with Dr Shawna Orleans on Fri, 2/3.  Advised pt dr Shawna Orleans will not be here, but pt wants to keep her lab appointment for 2/1. Pt will wait to hear about lab results from Dr Shawna Orleans and if pt needs to follow up,she will see [facility].

## 2015-09-11 ENCOUNTER — Other Ambulatory Visit (INDEPENDENT_AMBULATORY_CARE_PROVIDER_SITE_OTHER): Payer: Medicare Other

## 2015-09-11 DIAGNOSIS — D649 Anemia, unspecified: Secondary | ICD-10-CM | POA: Diagnosis not present

## 2015-09-11 LAB — CBC WITH DIFFERENTIAL/PLATELET
Basophils Absolute: 0.1 10*3/uL (ref 0.0–0.1)
Basophils Relative: 1.2 % (ref 0.0–3.0)
EOS ABS: 0.2 10*3/uL (ref 0.0–0.7)
EOS PCT: 2.8 % (ref 0.0–5.0)
HEMATOCRIT: 36.1 % (ref 36.0–46.0)
HEMOGLOBIN: 11.9 g/dL — AB (ref 12.0–15.0)
LYMPHS PCT: 28 % (ref 12.0–46.0)
Lymphs Abs: 1.9 10*3/uL (ref 0.7–4.0)
MCHC: 32.9 g/dL (ref 30.0–36.0)
MCV: 92.2 fl (ref 78.0–100.0)
Monocytes Absolute: 0.6 10*3/uL (ref 0.1–1.0)
Monocytes Relative: 8.8 % (ref 3.0–12.0)
Neutro Abs: 4.1 10*3/uL (ref 1.4–7.7)
Neutrophils Relative %: 59.2 % (ref 43.0–77.0)
Platelets: 310 10*3/uL (ref 150.0–400.0)
RBC: 3.92 Mil/uL (ref 3.87–5.11)
RDW: 13.7 % (ref 11.5–15.5)
WBC: 6.9 10*3/uL (ref 4.0–10.5)

## 2015-09-11 LAB — BASIC METABOLIC PANEL
BUN: 14 mg/dL (ref 6–23)
CO2: 26 mEq/L (ref 19–32)
Calcium: 9.6 mg/dL (ref 8.4–10.5)
Chloride: 103 mEq/L (ref 96–112)
Creatinine, Ser: 1.17 mg/dL (ref 0.40–1.20)
GFR: 46.8 mL/min — AB (ref >60.00)
Glucose, Bld: 132 mg/dL — ABNORMAL HIGH (ref 70–99)
POTASSIUM: 4.4 meq/L (ref 3.5–5.1)
Sodium: 137 mEq/L (ref 135–145)

## 2015-09-13 ENCOUNTER — Ambulatory Visit: Payer: Medicare Other | Admitting: Internal Medicine

## 2015-09-13 LAB — ERYTHROPOIETIN: Erythropoietin: 10.2 m[IU]/mL (ref 2.6–18.5)

## 2015-09-18 ENCOUNTER — Encounter: Payer: Self-pay | Admitting: Adult Health

## 2015-09-18 ENCOUNTER — Telehealth: Payer: Self-pay | Admitting: Internal Medicine

## 2015-09-18 ENCOUNTER — Ambulatory Visit (INDEPENDENT_AMBULATORY_CARE_PROVIDER_SITE_OTHER): Payer: Medicare Other | Admitting: Adult Health

## 2015-09-18 VITALS — BP 138/68 | HR 88 | Temp 101.2°F | Wt 176.4 lb

## 2015-09-18 DIAGNOSIS — J208 Acute bronchitis due to other specified organisms: Secondary | ICD-10-CM

## 2015-09-18 DIAGNOSIS — R509 Fever, unspecified: Secondary | ICD-10-CM | POA: Diagnosis not present

## 2015-09-18 LAB — POCT INFLUENZA A/B
INFLUENZA A, POC: NEGATIVE
Influenza B, POC: NEGATIVE

## 2015-09-18 MED ORDER — DOXYCYCLINE HYCLATE 100 MG PO CAPS: 100.0000 mg | ORAL_CAPSULE | Freq: Two times a day (BID) | ORAL | Status: DC

## 2015-09-18 MED ORDER — METHYLPREDNISOLONE 4 MG PO TBPK: ORAL_TABLET | ORAL | Status: DC

## 2015-09-18 NOTE — Patient Instructions (Signed)
It was great seeing you again, but I am sorry you are feeling so badly.   I have sent in a prescription for Doxycycline ( antibiotic) and for Prednisone ( steroid) take these as directed.   Use Mucinex as well to help with the cough.   Make sure you drink a lot of water.   Take Tylenol 650mg  every 8 hours for the next 2-3 days.   Follow up if no improvement in the next 2-3 days.

## 2015-09-18 NOTE — Telephone Encounter (Signed)
Rx sent 

## 2015-09-18 NOTE — Progress Notes (Signed)
Pre visit review using our clinic review tool, if applicable. No additional management support is needed unless otherwise documented below in the visit note. 

## 2015-09-18 NOTE — Progress Notes (Signed)
Subjective:    Patient ID: [name], female    DOB: 1929/06/01, 81 y.o.   MRN: [id number]  HPI  82 year old female who presents to the office today for productive cough, fever up to 102F, headache,rhinorhea, and fatigue x 3 days.   She has been using Delsym and cough drops without relief. Has not been taking any tylenol or ibuprofen.   Her cough is keeping her up at night.   Denies any nausea, vomiting, or diarrhea.   Review of Systems  Constitutional: Positive for fever, activity change and fatigue.  HENT: Positive for congestion, postnasal drip and rhinorrhea. Negative for sinus pressure.   Respiratory: Positive for cough and shortness of breath. Negative for choking, chest tightness and wheezing.   Neurological: Positive for headaches. Negative for dizziness and weakness.  Hematological: Positive for adenopathy.  Psychiatric/Behavioral: Positive for sleep disturbance.   Past Medical History  Diagnosis Date  . HYPOTHYROIDISM 04/04/2007  . HYPERTENSION 04/04/2007  . Chronic fatigue   . Depression   . Anemia   . Chronic renal insufficiency, stage III (moderate)     Social History   Social History  . Marital Status: Widowed    Spouse Name: N/A  . Number of Children: N/A  . Years of Education: N/A   Occupational History  . Retired    Social History Main Topics  . Smoking status: Former Smoker -- 1.00 packs/day for 7 years    Types: Cigarettes    Quit date: 10/13/1969  . Smokeless tobacco: Not on file  . Alcohol Use: No  . Drug Use: No  . Sexual Activity: Not on file   Other Topics Concern  . Not on file   Social History Narrative    Past Surgical History  Procedure Laterality Date  . Appendectomy    . Cholecystectomy    . Hernia repair    . Abdominal hysterectomy    . Spine surgery  2011    cervical  . Oophorectomy    . Back surgery  2012  . Orif tibia fracture  2012    dr duda  . Ercp N/A 11/22/2014    Procedure: ENDOSCOPIC RETROGRADE  CHOLANGIOPANCREATOGRAPHY (ERCP);  Surgeon: Clarene Essex, MD;  Location: [facility] ENDOSCOPY;  Service: Endoscopy;  Laterality: N/A;    Family History  Problem Relation Age of Onset  . Heart disease Mother   . Cancer Mother 60    renal cell CA  . Lymphoma Son   . Heart disease Father     Allergies  Allergen Reactions  . Oxycodone Hcl     REACTION: itching    Current Outpatient Prescriptions on File Prior to Visit  Medication Sig Dispense Refill  . amLODipine (NORVASC) 5 MG tablet Take 1.5 tablets (7.5 mg total) by mouth daily. 135 tablet 1  . cetirizine (ZYRTEC) 10 MG tablet Take 10 mg by mouth daily.    . clidinium-chlordiazePOXIDE (LIBRAX) 2.5-5 MG per capsule 2 (two) times daily as needed.     . fluticasone (FLONASE) 50 MCG/ACT nasal spray Place 2 sprays into both nostrils daily. 16 g 5  . levothyroxine (SYNTHROID, LEVOTHROID) 137 MCG tablet TAKE 1 TABLET DAILY BEFORE BREAKFAST 90 tablet 2  . Multiple Vitamin (MULTIVITAMIN) tablet Take 1 tablet by mouth daily.    Marland Kitchen olmesartan (BENICAR) 40 MG tablet Take 1 tablet (40 mg total) by mouth daily. 90 tablet 1  . omeprazole (PRILOSEC) 40 MG capsule Take 1 tablet by mouth daily before breakfast.     .  traMADol (ULTRAM) 50 MG tablet Take 0.5 tablets (25 mg total) by mouth every 12 (twelve) hours as needed (Do not take near bedtime). 30 tablet 1   No current facility-administered medications on file prior to visit.    BP 138/68 mmHg  Pulse 88  Temp(Src) 101.2 F (38.4 C) (Oral)  Wt 176 lb 6.4 oz (80.015 kg)  SpO2 94%       Objective:   Physical Exam  Constitutional: She is oriented to person, place, and time. She appears well-developed and well-nourished. No distress.  She appears tired  HENT:  Head: Normocephalic and atraumatic.  Right Ear: External ear normal.  Left Ear: External ear normal.  Nose: Nose normal.  Mouth/Throat: Oropharynx is clear and moist. No oropharyngeal exudate.  Eyes: Conjunctivae and EOM are normal. Pupils  are equal, round, and reactive to light. Left eye exhibits no discharge.  Neck: Normal range of motion. Neck supple.  Cardiovascular: Normal rate, regular rhythm, normal heart sounds and intact distal pulses.  Exam reveals no gallop and no friction rub.   No murmur heard. Pulmonary/Chest: Effort normal and breath sounds normal. No respiratory distress. She has no wheezes. She has no rales. She exhibits no tenderness.  Non productive cough   Lymphadenopathy:       Head (right side): Submental and submandibular adenopathy present. No tonsillar adenopathy present.       Head (left side): Submental and submandibular adenopathy present. No tonsillar adenopathy present.    She has no cervical adenopathy.    She has no axillary adenopathy.  Neurological: She is alert and oriented to person, place, and time.  Skin: Skin is warm and dry. No rash noted. She is not diaphoretic. No erythema. No pallor.  Psychiatric: She has a normal mood and affect. Her behavior is normal. Judgment and thought content normal.  Nursing note and vitals reviewed.     Assessment & Plan:  1. Acute bronchitis due to other specified organisms - Lungs clear and no diminished breath sounds. Not concerned for pneumonia at this point.  - doxycycline (VIBRAMYCIN) 100 MG capsule; Take 1 capsule (100 mg total) by mouth 2 (two) times daily.  Dispense: 20 capsule; Refill: 0 - methylPREDNISolone (MEDROL DOSEPAK) 4 MG TBPK tablet; Take as directed  Dispense: 21 tablet; Refill: 0 - Follow up if no improvement.  2. Fever, unspecified - Tylenol or Ibuprofen every 8 hours for the next 2-3 days - doxycycline (VIBRAMYCIN) 100 MG capsule; Take 1 capsule (100 mg total) by mouth 2 (two) times daily.  Dispense: 20 capsule; Refill: 0

## 2015-09-18 NOTE — Telephone Encounter (Signed)
Pt just seen by [name], but [facility].  Can you resend to  [facility] [address] [phone]  methylPREDNISolone (MEDROL DOSEPAK) 4 MG TBPK tablet Doxycycline (VIBRAMYCIN) 100 MG capsule

## 2015-11-24 IMAGING — CT CT ABD-PELV W/ CM
2 of 5 series · 16 of 46 positions shown, 18 images · IV contrast (omnipaque)
Comparison: None.

CLINICAL DATA: Severe epigastric pain starting today at 11.

EXAM:
CT ABDOMEN AND PELVIS WITH CONTRAST
TECHNIQUE: Multidetector CT imaging of the abdomen and pelvis was performed
using the standard protocol following bolus administration of
intravenous contrast.
CONTRAST:  80mL OMNIPAQUE IOHEXOL 300 MG/ML  SOLN

[Series 2: abd/ pelvis 5.0 i30f 1 · axial · 0.74mm/px · z∈[-447,-17]mm · 13 of 98 slices shown, 15 images]
[im 6/98  soft-tissue]
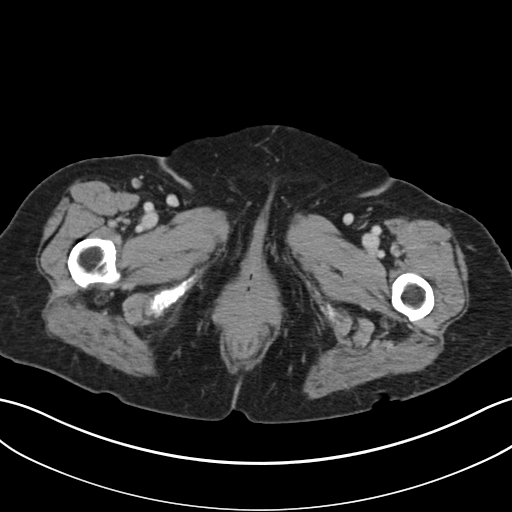
[im 6/98  bone]
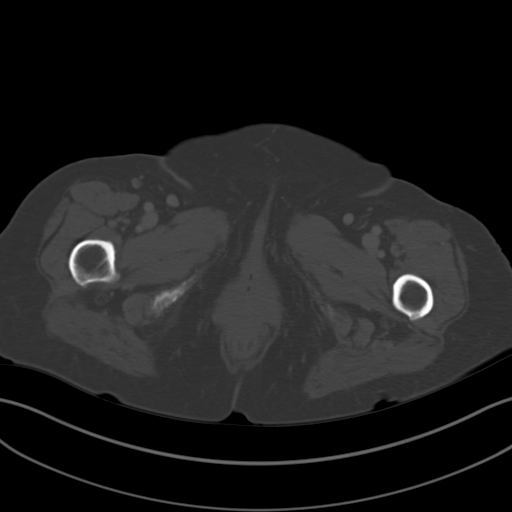
[im 16/98  soft-tissue]
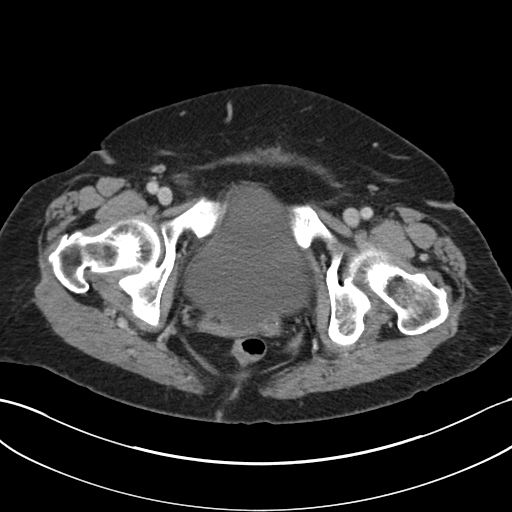
[im 21/98  soft-tissue]
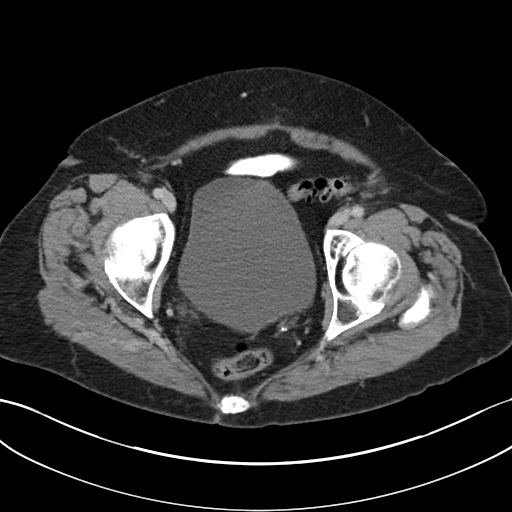
[im 26/98  soft-tissue]
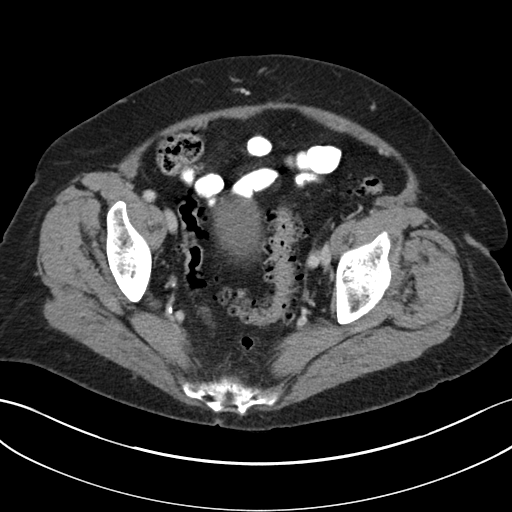
[im 36/98  soft-tissue]
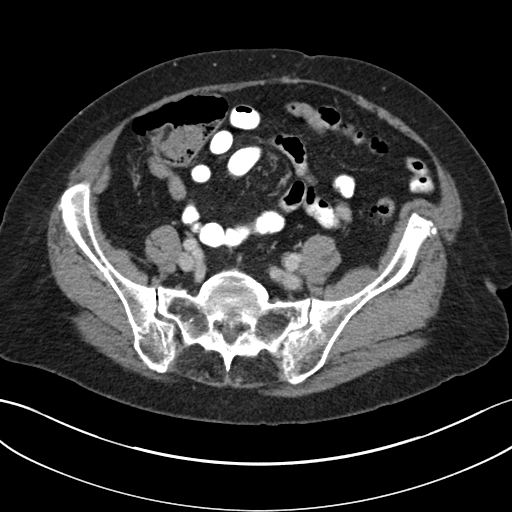
[im 41/98  soft-tissue]
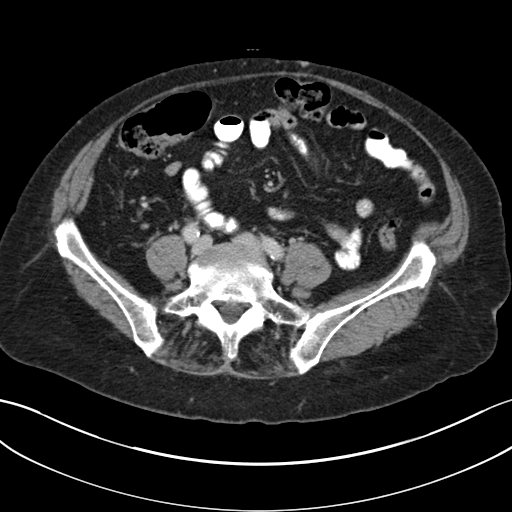
[im 52/98  soft-tissue]
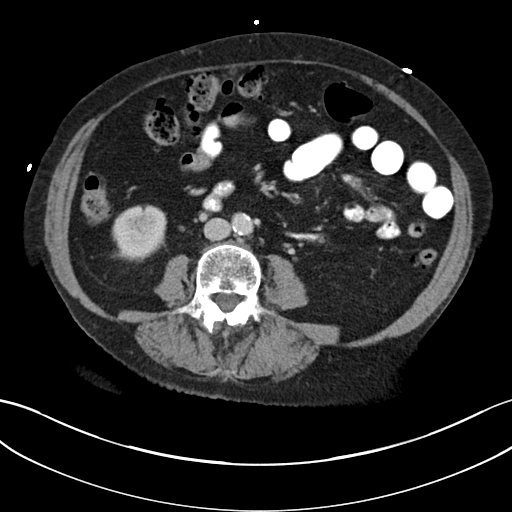
[im 57/98  soft-tissue]
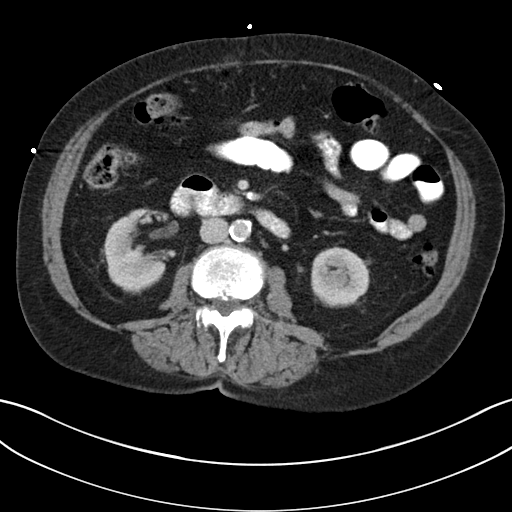
[im 62/98  soft-tissue]
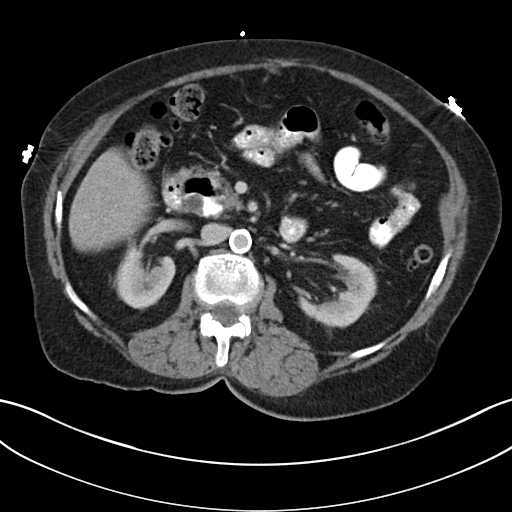
[im 62/98  bone]
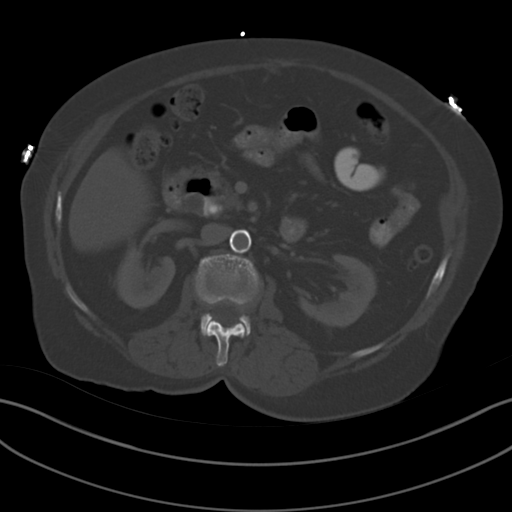
[im 72/98  soft-tissue]
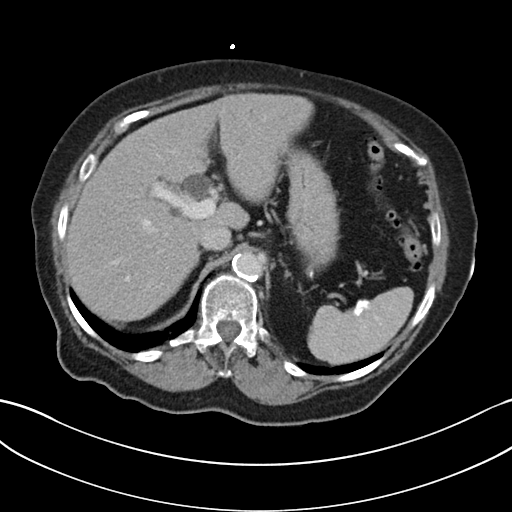
[im 77/98  soft-tissue]
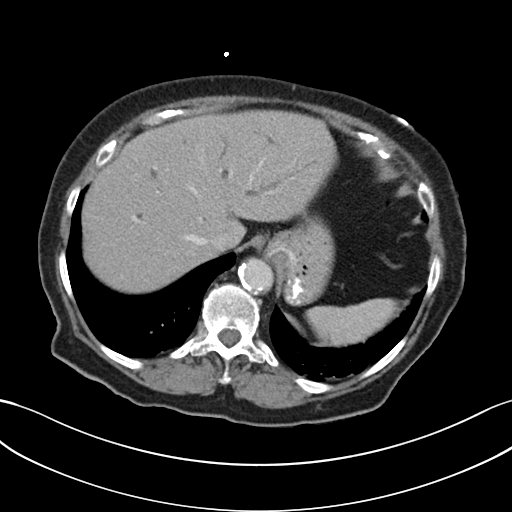
[im 82/98  soft-tissue]
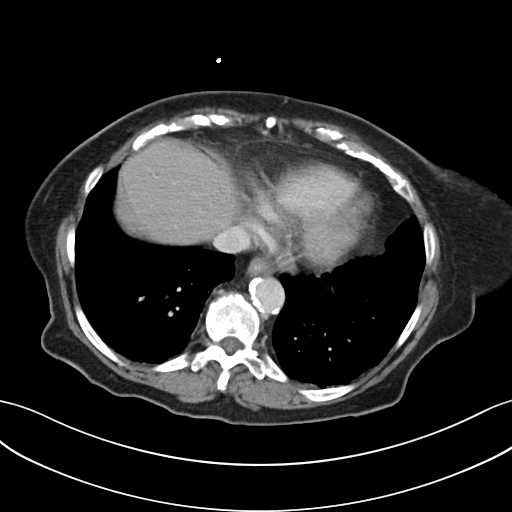
[im 92/98  soft-tissue]
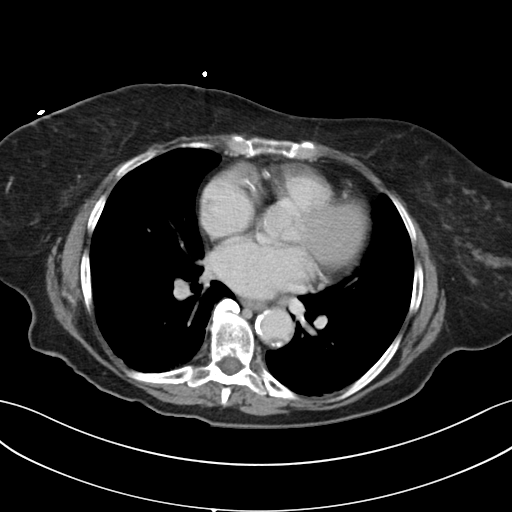

[Series 5: coronals · coronal · 0.70mm/px · 3 of 142 slices shown]
[im 48/142  soft-tissue]
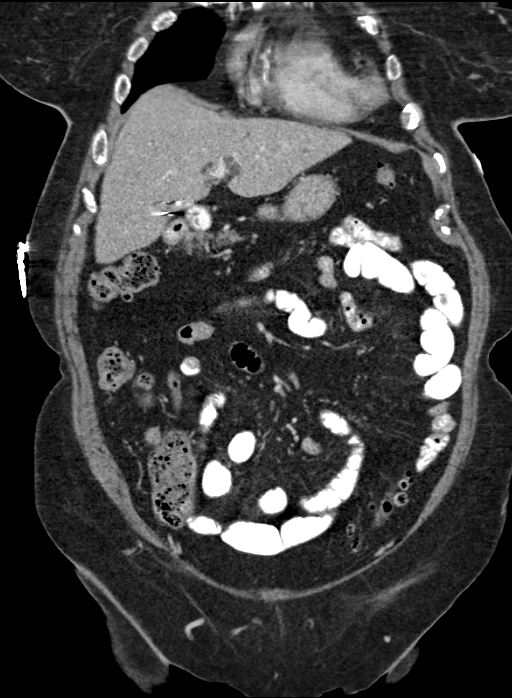
[im 63/142  soft-tissue]
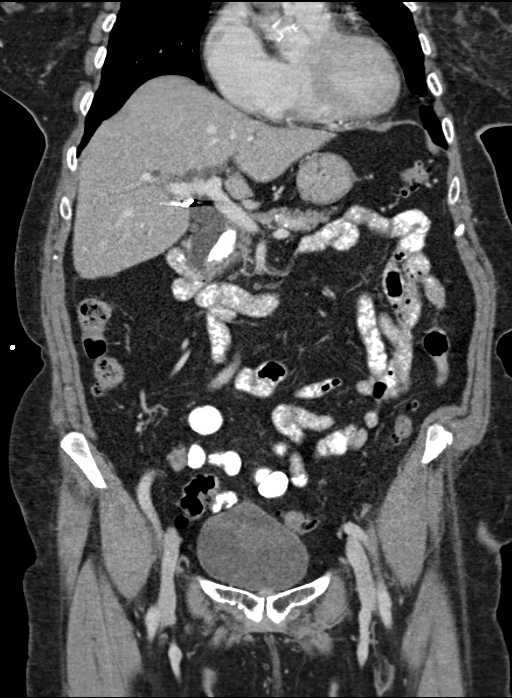
[im 79/142  soft-tissue]
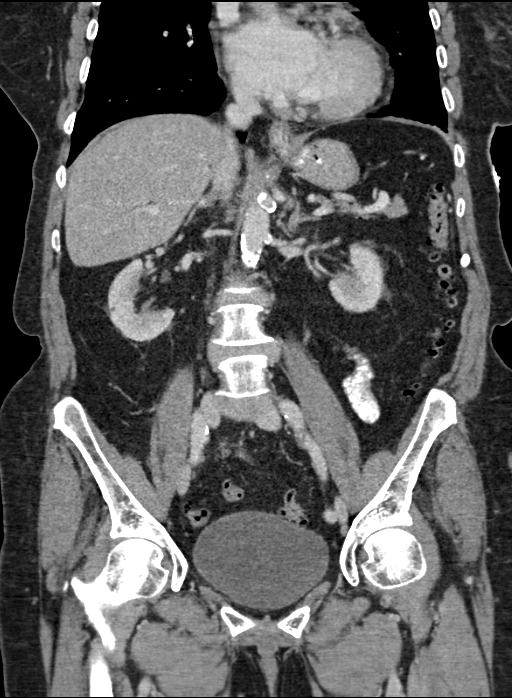

[16 of 46 positions shown; findings below may reference images not displayed]

FINDINGS: BODY WALL: No contributory findings.

LOWER CHEST: Extensive coronary atherosclerosis.

ABDOMEN/PELVIS:

Liver: No focal abnormality.

Biliary: Cholecystectomy. There is intra and extrahepatic biliary
dilatation with the common bile duct measuring up to 16 mm. The duct
is smoothly tapering and there is no calcified choledocholithiasis.

Pancreas: Unremarkable.

Spleen: Unremarkable.

Adrenals: Unremarkable.

Kidneys and ureters: No hydronephrosis or stone.

Bladder: Unremarkable.

Reproductive: Hysterectomy and probable oophorectomies. Pelvic floor
laxity.

Bowel: Widespread colonic diverticulosis. No bowel obstruction or
wall thickening. Large but uncomplicated mid duodenal diverticulum.
No indication of appendicitis.

Retroperitoneum: No mass or adenopathy.

Peritoneum: No ascites or pneumoperitoneum.

Vascular: No acute abnormality. Densely calcified 14 mm splenic
artery aneurysm.

OSSEOUS: No acute abnormalities.
IMPRESSION: Intra and extrahepatic biliary dilatation. Although post
cholecystectomy, findings are concerning for obstruction given the
liver function tests. There is no visible mass or
choledocholithiasis.

## 2015-12-24 DIAGNOSIS — L570 Actinic keratosis: Secondary | ICD-10-CM | POA: Diagnosis not present

## 2015-12-24 DIAGNOSIS — D239 Other benign neoplasm of skin, unspecified: Secondary | ICD-10-CM | POA: Diagnosis not present

## 2016-01-20 ENCOUNTER — Other Ambulatory Visit: Payer: Self-pay

## 2016-01-20 MED ORDER — AMLODIPINE BESYLATE 5 MG PO TABS: 7.5000 mg | ORAL_TABLET | Freq: Every day | ORAL | Status: DC

## 2016-01-20 MED ORDER — LEVOTHYROXINE SODIUM 137 MCG PO TABS: 137.0000 ug | ORAL_TABLET | Freq: Every day | ORAL | Status: DC

## 2016-01-21 ENCOUNTER — Telehealth: Payer: Self-pay | Admitting: Internal Medicine

## 2016-01-21 ENCOUNTER — Other Ambulatory Visit: Payer: Self-pay | Admitting: Adult Health

## 2016-01-21 DIAGNOSIS — E039 Hypothyroidism, unspecified: Secondary | ICD-10-CM

## 2016-01-21 NOTE — Telephone Encounter (Signed)
Pt advised.

## 2016-01-21 NOTE — Telephone Encounter (Signed)
Pt request refill of the following:   levothyroxine (SYNTHROID, LEVOTHROID) 137 MCG tablet ,   amLODipine (NORVASC) 5 MG tablet   The [facility] and pt need the above rx sent to the below pharmacy      Phamacy:  [facility]

## 2016-01-21 NOTE — Telephone Encounter (Signed)
Called [facility] gave verbal orders to change Levothyroxine and amlodipine from 90 days to 30 days.  LVM for pt to return our call -  Pt needs to schedule follow up appt and lab appt.

## 2016-01-21 NOTE — Telephone Encounter (Signed)
[facility]   Pt does not get any meds filled at [facility] unless emergency or acute issue.

## 2016-01-21 NOTE — Telephone Encounter (Signed)
Ok to refill for 30 days. She needs to come have her thyroid checked before any additional refills.   Lab order placed

## 2016-01-22 NOTE — Telephone Encounter (Signed)
Advised pt that we have not checked thyroid lab for 2 years, therefore we are unable to send a 3 mth supply until she has labs/appt.

## 2016-01-22 NOTE — Telephone Encounter (Signed)
Spoke with pt, we cleared things up, Pt has enough medication to hold her over until OV with [facility].

## 2016-02-03 ENCOUNTER — Other Ambulatory Visit (INDEPENDENT_AMBULATORY_CARE_PROVIDER_SITE_OTHER): Payer: Medicare Other

## 2016-02-03 DIAGNOSIS — E039 Hypothyroidism, unspecified: Secondary | ICD-10-CM | POA: Diagnosis not present

## 2016-02-03 LAB — TSH: TSH: 3.63 u[IU]/mL (ref 0.35–4.50)

## 2016-02-05 ENCOUNTER — Encounter: Payer: Self-pay | Admitting: Adult Health

## 2016-02-05 ENCOUNTER — Ambulatory Visit (INDEPENDENT_AMBULATORY_CARE_PROVIDER_SITE_OTHER): Payer: Medicare Other | Admitting: Adult Health

## 2016-02-05 ENCOUNTER — Other Ambulatory Visit: Payer: Self-pay | Admitting: Gastroenterology

## 2016-02-05 VITALS — BP 152/62 | Temp 98.1°F | Wt 178.0 lb

## 2016-02-05 DIAGNOSIS — R740 Nonspecific elevation of levels of transaminase and lactic acid dehydrogenase [LDH]: Secondary | ICD-10-CM | POA: Diagnosis not present

## 2016-02-05 DIAGNOSIS — R932 Abnormal findings on diagnostic imaging of liver and biliary tract: Secondary | ICD-10-CM

## 2016-02-05 DIAGNOSIS — I10 Essential (primary) hypertension: Secondary | ICD-10-CM | POA: Diagnosis not present

## 2016-02-05 DIAGNOSIS — R109 Unspecified abdominal pain: Secondary | ICD-10-CM

## 2016-02-05 DIAGNOSIS — E039 Hypothyroidism, unspecified: Secondary | ICD-10-CM | POA: Diagnosis not present

## 2016-02-05 DIAGNOSIS — R7989 Other specified abnormal findings of blood chemistry: Secondary | ICD-10-CM

## 2016-02-05 DIAGNOSIS — R945 Abnormal results of liver function studies: Secondary | ICD-10-CM

## 2016-02-05 MED ORDER — AMLODIPINE BESYLATE 10 MG PO TABS: 10.0000 mg | ORAL_TABLET | Freq: Every day | ORAL | Status: DC

## 2016-02-05 MED ORDER — LEVOTHYROXINE SODIUM 137 MCG PO TABS: 137.0000 ug | ORAL_TABLET | Freq: Every day | ORAL | Status: DC

## 2016-02-05 MED ORDER — OLMESARTAN MEDOXOMIL 20 MG PO TABS: 40.0000 mg | ORAL_TABLET | Freq: Every day | ORAL | Status: DC

## 2016-02-05 NOTE — Progress Notes (Signed)
Subjective:    Patient ID: [name], female    DOB: 1928-11-22, 82 y.o.   MRN: [id number]  HPI  82 year old female who presents to the office today for follow up regarding her TSH. She has not had it checked in over a year. Today her TSH is  Lab Results  Component Value Date   TSH 3.63 02/03/2016   One year ago her thyroid was 0.92. She reports that she misses her medication about twice a week.   She does report that she has been feeling fatigued on and off for an unknown amount of time.   Her blood pressure is also elevated in the office at BP: (!) 152/62 mmHg She is currently taking Amlodipine 7.5 mg as well as Benicar 20 mg. She takes these medications every day. Denies headaches or blurred vision.    Review of Systems  Constitutional: Negative.   Respiratory: Negative.   Cardiovascular: Negative.   Neurological: Negative.      Past Medical History  Diagnosis Date  . HYPOTHYROIDISM 04/04/2007  . HYPERTENSION 04/04/2007  . Chronic fatigue   . Depression   . Anemia   . Chronic renal insufficiency, stage III (moderate)     Social History   Social History  . Marital Status: Widowed    Spouse Name: N/A  . Number of Children: N/A  . Years of Education: N/A   Occupational History  . Retired    Social History Main Topics  . Smoking status: Former Smoker -- 1.00 packs/day for 7 years    Types: Cigarettes    Quit date: 10/13/1969  . Smokeless tobacco: Not on file  . Alcohol Use: No  . Drug Use: No  . Sexual Activity: Not on file   Other Topics Concern  . Not on file   Social History Narrative    Past Surgical History  Procedure Laterality Date  . Appendectomy    . Cholecystectomy    . Hernia repair    . Abdominal hysterectomy    . Spine surgery  2011    cervical  . Oophorectomy    . Back surgery  2012  . Orif tibia fracture  2012    dr duda  . Ercp N/A 11/22/2014    Procedure: ENDOSCOPIC RETROGRADE CHOLANGIOPANCREATOGRAPHY (ERCP);  Surgeon: Clarene Essex, MD;  Location: [facility] ENDOSCOPY;  Service: Endoscopy;  Laterality: N/A;    Family History  Problem Relation Age of Onset  . Heart disease Mother   . Cancer Mother 20    renal cell CA  . Lymphoma Son   . Heart disease Father     Allergies  Allergen Reactions  . Oxycodone Hcl     REACTION: itching    Current Outpatient Prescriptions on File Prior to Visit  Medication Sig Dispense Refill  . cetirizine (ZYRTEC) 10 MG tablet Take 10 mg by mouth daily.    . clidinium-chlordiazePOXIDE (LIBRAX) 2.5-5 MG per capsule 2 (two) times daily as needed.     . Multiple Vitamin (MULTIVITAMIN) tablet Take 1 tablet by mouth daily.    Marland Kitchen omeprazole (PRILOSEC) 40 MG capsule Take 1 tablet by mouth daily before breakfast.     . traMADol (ULTRAM) 50 MG tablet Take 0.5 tablets (25 mg total) by mouth every 12 (twelve) hours as needed (Do not take near bedtime). 30 tablet 1   No current facility-administered medications on file prior to visit.    BP 152/62 mmHg  Temp(Src) 98.1 F (36.7 C) (Oral)  Wt 178 lb (80.74 kg)       Objective:   Physical Exam  Constitutional: She is oriented to person, place, and time. She appears well-developed and well-nourished. No distress.  Cardiovascular: Normal rate, regular rhythm, normal heart sounds and intact distal pulses.  Exam reveals no gallop and no friction rub.   No murmur heard. Pulmonary/Chest: Effort normal and breath sounds normal. No respiratory distress. She has no wheezes. She has no rales. She exhibits no tenderness.  Neurological: She is alert and oriented to person, place, and time.  Skin: Skin is warm and dry. No rash noted. She is not diaphoretic. No erythema. No pallor.  vorticose veins   Psychiatric: She has a normal mood and affect. Her behavior is normal. Judgment and thought content normal.  Vitals reviewed.     Assessment & Plan:  1. Hypothyroidism, unspecified hypothyroidism type - Will keep synthroid dose the same. Advised to  take every day in the morning, 30 minutes before food.  - levothyroxine (SYNTHROID, LEVOTHROID) 137 MCG tablet; Take 1 tablet (137 mcg total) by mouth daily before breakfast.  Dispense: 90 tablet; Refill: 3 - Will continue to monitor 2. Essential hypertension - Increased amlodipine from 7.5 mg to 10 mg  - amLODipine (NORVASC) 10 MG tablet; Take 1 tablet (10 mg total) by mouth daily.  Dispense: 90 tablet; Refill: 3 - olmesartan (BENICAR) 20 MG tablet; Take 2 tablets (40 mg total) by mouth daily.  Dispense: 90 tablet; Refill: 3 - Continue to monitor at home and return precautions given  - Follow up as needed  Dorothyann Peng, NP

## 2016-02-05 NOTE — Patient Instructions (Addendum)
I am not going to change your synthroid dose, make sure you are taking it every day, 30 minutes before meals.   I am going to increase your amlodipine from 7.5 mg to 10 mg   Follow up with me in 2 weeks after changing your diet   Also, make sure you schedule an appointment to establish care with me.

## 2016-02-14 ENCOUNTER — Ambulatory Visit
Admission: RE | Admit: 2016-02-14 | Discharge: 2016-02-14 | Disposition: A | Discharge status: Home / Self Care | Payer: Medicare Other | Source: Ambulatory Visit | Attending: Gastroenterology | Admitting: Gastroenterology

## 2016-02-14 DIAGNOSIS — R7989 Other specified abnormal findings of blood chemistry: Secondary | ICD-10-CM

## 2016-02-14 DIAGNOSIS — R109 Unspecified abdominal pain: Secondary | ICD-10-CM

## 2016-02-14 DIAGNOSIS — R945 Abnormal results of liver function studies: Secondary | ICD-10-CM

## 2016-02-14 DIAGNOSIS — R932 Abnormal findings on diagnostic imaging of liver and biliary tract: Secondary | ICD-10-CM

## 2016-02-14 DIAGNOSIS — K5730 Diverticulosis of large intestine without perforation or abscess without bleeding: Secondary | ICD-10-CM | POA: Diagnosis not present

## 2016-02-14 MED ORDER — IOPAMIDOL (ISOVUE-300) INJECTION 61%
100.0000 mL | Freq: Once | INTRAVENOUS | Status: AC | PRN
  Administered 2016-02-14: 100 mL via INTRAVENOUS

## 2016-02-19 ENCOUNTER — Ambulatory Visit (INDEPENDENT_AMBULATORY_CARE_PROVIDER_SITE_OTHER): Payer: Medicare Other | Admitting: Adult Health

## 2016-02-19 ENCOUNTER — Encounter: Payer: Self-pay | Admitting: Adult Health

## 2016-02-19 VITALS — BP 138/60 | Wt 178.0 lb

## 2016-02-19 DIAGNOSIS — I10 Essential (primary) hypertension: Secondary | ICD-10-CM

## 2016-02-19 MED ORDER — OLMESARTAN MEDOXOMIL 20 MG PO TABS: 20.0000 mg | ORAL_TABLET | Freq: Every day | ORAL | Status: DC

## 2016-02-19 NOTE — Progress Notes (Signed)
   Subjective:    Patient ID: [name], female    DOB: 1929/02/07, 82 y.o.   MRN: [id number]  HPI   82 year old female who presents to the office today for follow up after increasing   BP Readings from Last 3 Encounters:  02/19/16 138/60  02/05/16 152/62  09/18/15 138/68     Review of Systems  Constitutional: Negative.   Respiratory: Negative.   Cardiovascular: Negative.   Genitourinary: Negative.   Neurological: Negative.   All other systems reviewed and are negative.      Objective:   Physical Exam  Constitutional: She is oriented to person, place, and time. She appears well-developed and well-nourished. No distress.  Cardiovascular: Normal rate, regular rhythm, normal heart sounds and intact distal pulses.  Exam reveals no gallop and no friction rub.   No murmur heard. Pulmonary/Chest: Effort normal and breath sounds normal. No respiratory distress. She has no wheezes. She has no rales. She exhibits no tenderness.  Musculoskeletal: Normal range of motion. She exhibits no edema or tenderness.  Neurological: She is alert and oriented to person, place, and time. She has normal reflexes. She displays normal reflexes. No cranial nerve deficit. She exhibits normal muscle tone. Coordination normal.  Skin: Skin is warm and dry. No rash noted. She is not diaphoretic. No erythema. No pallor.  Psychiatric: She has a normal mood and affect. Her behavior is normal. Judgment and thought content normal.  Vitals reviewed.      Assessment & Plan:  1. Essential hypertension - Better controlled on current medication regimen.  - No change at this time - Continue to monitor  - follow up as needed   Dorothyann Peng, NP

## 2016-02-19 NOTE — Patient Instructions (Addendum)
It was great seeing you today!  Your blood pressure is better controlled at this time.   I am not going to change any medication.   I will see you next month

## 2016-02-21 ENCOUNTER — Telehealth: Payer: Self-pay | Admitting: Internal Medicine

## 2016-02-21 ENCOUNTER — Other Ambulatory Visit: Payer: Self-pay

## 2016-02-21 DIAGNOSIS — I10 Essential (primary) hypertension: Secondary | ICD-10-CM

## 2016-02-21 MED ORDER — OLMESARTAN MEDOXOMIL 20 MG PO TABS: 20.0000 mg | ORAL_TABLET | Freq: Every day | ORAL | Status: DC

## 2016-02-21 NOTE — Telephone Encounter (Signed)
Rx resent.

## 2016-02-21 NOTE — Telephone Encounter (Signed)
Pt need her Rx olmesartan (BENICAR) 20 mg resent to [facility] it should not have been sent to [facility].

## 2016-04-06 ENCOUNTER — Other Ambulatory Visit: Payer: Self-pay

## 2016-04-08 ENCOUNTER — Encounter: Payer: Self-pay | Admitting: Adult Health

## 2016-04-08 ENCOUNTER — Ambulatory Visit (INDEPENDENT_AMBULATORY_CARE_PROVIDER_SITE_OTHER): Payer: Medicare Other | Admitting: Adult Health

## 2016-04-08 VITALS — BP 152/78 | Temp 98.6°F | Ht 62.0 in | Wt 177.4 lb

## 2016-04-08 DIAGNOSIS — R05 Cough: Secondary | ICD-10-CM

## 2016-04-08 DIAGNOSIS — I10 Essential (primary) hypertension: Secondary | ICD-10-CM | POA: Diagnosis not present

## 2016-04-08 DIAGNOSIS — Z7689 Persons encountering health services in other specified circumstances: Secondary | ICD-10-CM

## 2016-04-08 DIAGNOSIS — R059 Cough, unspecified: Secondary | ICD-10-CM

## 2016-04-08 DIAGNOSIS — R1011 Right upper quadrant pain: Secondary | ICD-10-CM | POA: Diagnosis not present

## 2016-04-08 MED ORDER — BENZONATATE 100 MG PO CAPS: 100.0000 mg | ORAL_CAPSULE | Freq: Two times a day (BID) | ORAL | 2 refills | Status: DC

## 2016-04-08 NOTE — Progress Notes (Signed)
Patient presents to clinic today to establish care. She is a pleasant 82 year old female who  has a past medical history of Anemia; Chronic fatigue; Chronic renal insufficiency, stage III (moderate); Depression; HYPERTENSION (04/04/2007); and HYPOTHYROIDISM (04/04/2007). She is a previous patient of Dr. Shawna Orleans.   She is unsure of when her last physical was.     Acute Concerns: Establish Care   Chronic Issues: Hypertension -  - She is concerned that her blood pressure is not well controlled. She does not monitor her blood pressure at home but takes her medications as prescribed.   Cough - This has been an ongoing issue for > 3 years. She has been working up in the past multiple times for this, including chest x rays and pulmonary consults. Cough is productive at times. There is no obvious patterns from day to day. She denies any SOB, CP, or wheezing.   Abdominal Pain  - She was seen by Dr. Clarene Essex in the past but would like to switch to someone within [facility]. Patient reports that she continues to have episodes of abdominal pain form time to time and she contributes this to gallstones in the common bile duct. She has had her gallbladder removed. Was seeing Dr. Watt Climes but states " he couldn't fix it and told me I needed to go to [facility]. I want to see someone within the system here. I need someone that is not hateful and that will know what they ae doing."  She currently has no abdominal pain.  Per Dr. Perley Jain notes on 4/16   Moderate size duodenal diverticulum with ampulla at 7:00 only able to cannulate pancreatic duct 1 time as above #2.no additional findings   Health Maintenance: Dental -- Does not do routine care. She has a partial  Vision -- Routine care  Immunizations -- UTD  Colonoscopy -- 2014 Mammogram --2016 - No longer wants to do.  PAP -- No longer needs  Bone Density -- Unknown   Exercise: She does not have a routine regimen but she stays healthy  Diet: Eats healthy,  likes to eat fruits and vegetables.   Past Medical History:  Diagnosis Date  . Anemia   . Chronic fatigue   . Chronic renal insufficiency, stage III (moderate)   . Depression   . HYPERTENSION 04/04/2007  . HYPOTHYROIDISM 04/04/2007    Past Surgical History:  Procedure Laterality Date  . ABDOMINAL HYSTERECTOMY    . APPENDECTOMY    . BACK SURGERY  2012  . CHOLECYSTECTOMY    . ERCP N/A 11/22/2014   Procedure: ENDOSCOPIC RETROGRADE CHOLANGIOPANCREATOGRAPHY (ERCP);  Surgeon: Clarene Essex, MD;  Location: [facility] ENDOSCOPY;  Service: Endoscopy;  Laterality: N/A;  . HERNIA REPAIR    . OOPHORECTOMY    . ORIF TIBIA FRACTURE  2012   dr duda  . SPINE SURGERY  2011   cervical    Current Outpatient Prescriptions on File Prior to Visit  Medication Sig Dispense Refill  . amLODipine (NORVASC) 10 MG tablet Take 1 tablet (10 mg total) by mouth daily. 90 tablet 3  . cetirizine (ZYRTEC) 10 MG tablet Take 10 mg by mouth daily.    . clidinium-chlordiazePOXIDE (LIBRAX) 2.5-5 MG per capsule 2 (two) times daily as needed.     Marland Kitchen levothyroxine (SYNTHROID, LEVOTHROID) 137 MCG tablet Take 1 tablet (137 mcg total) by mouth daily before breakfast. 90 tablet 3  . Multiple Vitamin (MULTIVITAMIN) tablet Take 1 tablet by mouth daily.    Marland Kitchen olmesartan (  BENICAR) 20 MG tablet Take 1 tablet (20 mg total) by mouth daily. 90 tablet 3  . omeprazole (PRILOSEC) 40 MG capsule Take 1 tablet by mouth daily before breakfast.     . traMADol (ULTRAM) 50 MG tablet Take 0.5 tablets (25 mg total) by mouth every 12 (twelve) hours as needed (Do not take near bedtime). (Patient not taking: Reported on 02/19/2016) 30 tablet 1   No current facility-administered medications on file prior to visit.     Allergies  Allergen Reactions  . Oxycodone Hcl     REACTION: itching    Family History  Problem Relation Age of Onset  . Heart disease Mother   . Cancer Mother 31    renal cell CA  . Lymphoma Son   . Heart disease Father     Social  History   Social History  . Marital status: Widowed    Spouse name: N/A  . Number of children: N/A  . Years of education: N/A   Occupational History  . Retired Retired   Social History Main Topics  . Smoking status: Former Smoker    Packs/day: 1.00    Years: 7.00    Types: Cigarettes    Quit date: 10/13/1969  . Smokeless tobacco: Not on file  . Alcohol use No  . Drug use: No  . Sexual activity: Not on file   Other Topics Concern  . Not on file   Social History Narrative  . No narrative on file    Review of Systems  Constitutional: Negative.   HENT: Negative.   Respiratory: Positive for cough.   Cardiovascular: Negative.   Gastrointestinal: Negative.   Genitourinary: Negative.   Skin: Negative.     BP (!) 152/78   Temp 98.6 F (37 C) (Oral)   Ht 5\' 2"  (1.575 m)   Wt 177 lb 6.4 oz (80.5 kg)   BMI 32.45 kg/m   Physical Exam  Constitutional: She is oriented to person, place, and time and well-developed, well-nourished, and in no distress. No distress.  Cardiovascular: Normal rate, regular rhythm, normal heart sounds and intact distal pulses.  Exam reveals no gallop and no friction rub.   No murmur heard. Pulmonary/Chest: Effort normal and breath sounds normal. No respiratory distress. She has no wheezes.  Neurological: She is alert and oriented to person, place, and time. Gait normal. GCS score is 15.  Skin: Skin is warm and dry. No rash noted. She is not diaphoretic. No erythema. No pallor.  Psychiatric: Mood, memory, affect and judgment normal.  Nursing note and vitals reviewed.  Assessment/Plan:  1. Encounter to establish care - Follow up for MWE - Follow up sooner with any acute issues - Encouraged heart healthy diet and exercise  2. Cough - Will consider increasing GERD medication or changing medication all together.  - benzonatate (TESSALON) 100 MG capsule; Take 1 capsule (100 mg total) by mouth 2 (two) times daily.  Dispense: 20 capsule; Refill:  2  3. Right upper quadrant pain - Ambulatory referral to Gastroenterology  4. Essential hypertension - Not quite at goal during this office visit. Will continue to monitor   Dorothyann Peng, NP

## 2016-04-08 NOTE — Patient Instructions (Addendum)
It was great seeing you today.

## 2016-04-09 ENCOUNTER — Telehealth: Payer: Self-pay | Admitting: Adult Health

## 2016-04-09 NOTE — Telephone Encounter (Signed)
Pt would not elaborate the reason for the call. Pt is requesting brook to return her call

## 2016-04-09 NOTE — Telephone Encounter (Signed)
Called and spoke with patient.

## 2016-05-18 DIAGNOSIS — D3132 Benign neoplasm of left choroid: Secondary | ICD-10-CM | POA: Diagnosis not present

## 2016-05-18 DIAGNOSIS — D3131 Benign neoplasm of right choroid: Secondary | ICD-10-CM | POA: Diagnosis not present

## 2016-05-18 DIAGNOSIS — H353132 Nonexudative age-related macular degeneration, bilateral, intermediate dry stage: Secondary | ICD-10-CM | POA: Diagnosis not present

## 2016-05-18 DIAGNOSIS — H26491 Other secondary cataract, right eye: Secondary | ICD-10-CM | POA: Diagnosis not present

## 2016-05-26 DIAGNOSIS — H353130 Nonexudative age-related macular degeneration, bilateral, stage unspecified: Secondary | ICD-10-CM | POA: Diagnosis not present

## 2016-05-26 DIAGNOSIS — H26491 Other secondary cataract, right eye: Secondary | ICD-10-CM | POA: Diagnosis not present

## 2016-05-26 DIAGNOSIS — Z961 Presence of intraocular lens: Secondary | ICD-10-CM | POA: Diagnosis not present

## 2016-05-26 DIAGNOSIS — H02839 Dermatochalasis of unspecified eye, unspecified eyelid: Secondary | ICD-10-CM | POA: Diagnosis not present

## 2016-07-07 ENCOUNTER — Encounter: Payer: Self-pay | Admitting: Adult Health

## 2016-07-07 ENCOUNTER — Ambulatory Visit (INDEPENDENT_AMBULATORY_CARE_PROVIDER_SITE_OTHER): Payer: Medicare Other | Admitting: Adult Health

## 2016-07-07 VITALS — BP 150/62 | Temp 98.1°F | Ht 62.0 in | Wt 183.0 lb

## 2016-07-07 DIAGNOSIS — R05 Cough: Secondary | ICD-10-CM

## 2016-07-07 DIAGNOSIS — M5431 Sciatica, right side: Secondary | ICD-10-CM | POA: Diagnosis not present

## 2016-07-07 DIAGNOSIS — Z23 Encounter for immunization: Secondary | ICD-10-CM | POA: Diagnosis not present

## 2016-07-07 DIAGNOSIS — R059 Cough, unspecified: Secondary | ICD-10-CM

## 2016-07-07 MED ORDER — BENZONATATE 100 MG PO CAPS: 100.0000 mg | ORAL_CAPSULE | Freq: Two times a day (BID) | ORAL | 2 refills | Status: DC

## 2016-07-07 MED ORDER — CYCLOBENZAPRINE HCL 5 MG PO TABS: 5.0000 mg | ORAL_TABLET | Freq: Every day | ORAL | 0 refills | Status: DC

## 2016-07-07 MED ORDER — METHYLPREDNISOLONE ACETATE 80 MG/ML IJ SUSP
80.0000 mg | Freq: Once | INTRAMUSCULAR | Status: AC
  Administered 2016-07-07: 80 mg via INTRAMUSCULAR

## 2016-07-07 NOTE — Progress Notes (Signed)
Subjective:    Patient ID: [name], female    DOB: 1929/03/22, 82 y.o.   MRN: [id number]  HPI  82 year old female who  has a past medical history of Anemia; Chronic fatigue; Chronic renal insufficiency, stage III (moderate); Depression; HYPERTENSION (04/04/2007); HYPOTHYROIDISM (04/04/2007); and Macular degeneration.  She reports that over the last five weeks she has had right leg pain. She reports that she woke up with the pain and denies any trauma. When she woke up five weeks ago she had a "sharp stabbing" pain in her right leg. The pain traveled from the right hip down the outside of her right leg and into her right groin. The pain has eased up slightly. She has been using tylenol which she has not found any relief. Pain is worse when driving and sleeping. She denies any pain with walking  She also needs a refill of Tessalon for her chronic cough   Review of Systems  Constitutional: Negative.   Respiratory: Positive for cough (chronic ).   Cardiovascular: Negative.   Musculoskeletal: Positive for myalgias.  Skin: Negative.   All other systems reviewed and are negative.  Past Medical History:  Diagnosis Date  . Anemia   . Chronic fatigue   . Chronic renal insufficiency, stage III (moderate)   . Depression   . HYPERTENSION 04/04/2007  . HYPOTHYROIDISM 04/04/2007  . Macular degeneration     Social History   Social History  . Marital status: Widowed    Spouse name: N/A  . Number of children: N/A  . Years of education: N/A   Occupational History  . Retired Retired   Social History Main Topics  . Smoking status: Former Smoker    Packs/day: 1.00    Years: 7.00    Types: Cigarettes    Quit date: 10/13/1969  . Smokeless tobacco: Not on file  . Alcohol use No  . Drug use: No  . Sexual activity: Not on file   Other Topics Concern  . Not on file   Social History Narrative  . No narrative on file    Past Surgical History:  Procedure Laterality Date  . ABDOMINAL  HYSTERECTOMY    . APPENDECTOMY    . BACK SURGERY  2012  . Cataracts    . CHOLECYSTECTOMY    . ERCP N/A 11/22/2014   Procedure: ENDOSCOPIC RETROGRADE CHOLANGIOPANCREATOGRAPHY (ERCP);  Surgeon: Clarene Essex, MD;  Location: [facility] ENDOSCOPY;  Service: Endoscopy;  Laterality: N/A;  . HERNIA REPAIR    . OOPHORECTOMY    . ORIF TIBIA FRACTURE  2012   dr duda  . SPINE SURGERY  2011   cervical    Family History  Problem Relation Age of Onset  . Kidney cancer Mother 51    renal cell CA  . Heart disease Father   . Lymphoma Son   . Breast cancer Daughter     Allergies  Allergen Reactions  . Oxycodone Hcl     REACTION: itching    Current Outpatient Prescriptions on File Prior to Visit  Medication Sig Dispense Refill  . amLODipine (NORVASC) 10 MG tablet Take 1 tablet (10 mg total) by mouth daily. 90 tablet 3  . benzonatate (TESSALON) 100 MG capsule Take 1 capsule (100 mg total) by mouth 2 (two) times daily. 20 capsule 2  . cetirizine (ZYRTEC) 10 MG tablet Take 10 mg by mouth daily.    . clidinium-chlordiazePOXIDE (LIBRAX) 2.5-5 MG per capsule 2 (two) times daily as needed.     Marland Kitchen  levothyroxine (SYNTHROID, LEVOTHROID) 137 MCG tablet Take 1 tablet (137 mcg total) by mouth daily before breakfast. 90 tablet 3  . Multiple Vitamin (MULTIVITAMIN) tablet Take 1 tablet by mouth daily.    Marland Kitchen olmesartan (BENICAR) 20 MG tablet Take 1 tablet (20 mg total) by mouth daily. 90 tablet 3  . omeprazole (PRILOSEC) 40 MG capsule Take 1 tablet by mouth daily before breakfast.     . traMADol (ULTRAM) 50 MG tablet Take 0.5 tablets (25 mg total) by mouth every 12 (twelve) hours as needed (Do not take near bedtime). 30 tablet 1   No current facility-administered medications on file prior to visit.     BP (!) 150/62   Temp 98.1 F (36.7 C) (Oral)   Ht 5\' 2"  (1.575 m)   Wt 183 lb (83 kg)   BMI 33.47 kg/m       Objective:   Physical Exam  Constitutional: She is oriented to person, place, and time. She appears  well-developed and well-nourished. No distress.  Cardiovascular: Normal rate, regular rhythm, normal heart sounds and intact distal pulses.  Exam reveals no gallop and no friction rub.   No murmur heard. Pulmonary/Chest: Effort normal and breath sounds normal. No respiratory distress. She has no wheezes. She has no rales. She exhibits no tenderness.  Musculoskeletal: She exhibits tenderness (along right sciatic nerve ). She exhibits no edema or deformity.  Neurological: She is alert and oriented to person, place, and time. She has normal reflexes.  Skin: Skin is warm and dry. No rash noted. She is not diaphoretic. No erythema. No pallor.  Psychiatric: She has a normal mood and affect. Her behavior is normal. Judgment and thought content normal.  Nursing note and vitals reviewed.     Assessment & Plan:   1. Sciatica of right side - cyclobenzaprine (FLEXERIL) 5 MG tablet; Take 1 tablet (5 mg total) by mouth at bedtime.  Dispense: 7 tablet; Refill: 0 - methylPREDNISolone acetate (DEPO-MEDROL) injection 80 mg; Inject 1 mL (80 mg total) into the muscle once. - Can continue with Tylenol  - Stretching exercise  2. Cough - benzonatate (TESSALON) 100 MG capsule; Take 1 capsule (100 mg total) by mouth 2 (two) times daily.  Dispense: 20 capsule; Refill: 2  Dorothyann Peng, NP

## 2016-07-07 NOTE — Addendum Note (Signed)
Addended by: Sandria Bales B on: 07/07/2016 02:31 PM   Modules accepted: Orders

## 2016-07-07 NOTE — Patient Instructions (Addendum)
It was great seeing you today!  Your exam is consistent with sciatica.   The prednisone shot will help with this pain. You can use the muscle relaxer at night, as this will make you sleepy.   Please follow up with me if you are not feeling any better.    Sciatica Sciatica is pain, numbness, weakness, or tingling along the path of the sciatic nerve. The sciatic nerve starts in the lower back and runs down the back of each leg. The nerve controls the muscles in the lower leg and in the back of the knee. It also provides feeling (sensation) to the back of the thigh, the lower leg, and the sole of the foot. Sciatica is a symptom of another medical condition that pinches or puts pressure on the sciatic nerve. Generally, sciatica only affects one side of the body. Sciatica usually goes away on its own or with treatment. In some cases, sciatica may keep coming back (recur). What are the causes? This condition is caused by pressure on the sciatic nerve, or pinching of the sciatic nerve. This may be the result of:  A disk in between the bones of the spine (vertebrae) bulging out too far (herniated disk).  Age-related changes in the spinal disks (degenerative disk disease).  A pain disorder that affects a muscle in the buttock (piriformis syndrome).  Extra bone growth (bone spur) near the sciatic nerve.  An injury or break (fracture) of the pelvis.  Pregnancy.  Tumor (rare). What increases the risk? The following factors may make you more likely to develop this condition:  Playing sports that place pressure or stress on the spine, such as football or weight lifting.  Having poor strength and flexibility.  A history of back injury.  A history of back surgery.  Sitting for long periods of time.  Doing activities that involve repetitive bending or lifting.  Obesity. What are the signs or symptoms? Symptoms can vary from mild to very severe, and they may include:  Any of these  problems in the lower back, leg, hip, or buttock:  Mild tingling or dull aches.  Burning sensations.  Sharp pains.  Numbness in the back of the calf or the sole of the foot.  Leg weakness.  Severe back pain that makes movement difficult. These symptoms may get worse when you cough, sneeze, or laugh, or when you sit or stand for long periods of time. Being overweight may also make symptoms worse. In some cases, symptoms may recur over time. How is this diagnosed? This condition may be diagnosed based on:  Your symptoms.  A physical exam. Your health care provider may ask you to do certain movements to check whether those movements trigger your symptoms.  You may have tests, including:  Blood tests.  X-rays.  MRI.  CT scan. How is this treated? In many cases, this condition improves on its own, without any treatment. However, treatment may include:  Reducing or modifying physical activity during periods of pain.  Exercising and stretching to strengthen your abdomen and improve the flexibility of your spine.  Icing and applying heat to the affected area.  Medicines that help:  To relieve pain and swelling.  To relax your muscles.  Injections of medicines that help to relieve pain, irritation, and inflammation around the sciatic nerve (steroids).  Surgery. Follow these instructions at home: Medicines  Take over-the-counter and prescription medicines only as told by your health care provider.  Do not drive or operate heavy machinery  while taking prescription pain medicine. Managing pain  If directed, apply ice to the affected area.  Put ice in a plastic bag.  Place a towel between your skin and the bag.  Leave the ice on for 20 minutes, 2-3 times a day.  After icing, apply heat to the affected area before you exercise or as often as told by your health care provider. Use the heat source that your health care provider recommends, such as a moist heat pack or  a heating pad.  Place a towel between your skin and the heat source.  Leave the heat on for 20-30 minutes.  Remove the heat if your skin turns bright red. This is especially important if you are unable to feel pain, heat, or cold. You may have a greater risk of getting burned. Activity  Return to your normal activities as told by your health care provider. Ask your health care provider what activities are safe for you.  Avoid activities that make your symptoms worse.  Take brief periods of rest throughout the day. Resting in a lying or standing position is usually better than sitting to rest.  When you rest for longer periods, mix in some mild activity or stretching between periods of rest. This will help to prevent stiffness and pain.  Avoid sitting for long periods of time without moving. Get up and move around at least one time each hour.  Exercise and stretch regularly, as told by your health care provider.  Do not lift anything that is heavier than 10 lb (4.5 kg) while you have symptoms of sciatica. When you do not have symptoms, you should still avoid heavy lifting, especially repetitive heavy lifting.  When you lift objects, always use proper lifting technique, which includes:  Bending your knees.  Keeping the load close to your body.  Avoiding twisting. General instructions  Use good posture.  Avoid leaning forward while sitting.  Avoid hunching over while standing.  Maintain a healthy weight. Excess weight puts extra stress on your back and makes it difficult to maintain good posture.  Wear supportive, comfortable shoes. Avoid wearing high heels.  Avoid sleeping on a mattress that is too soft or too hard. A mattress that is firm enough to support your back when you sleep may help to reduce your pain.  Keep all follow-up visits as told by your health care provider. This is important. Contact a health care provider if:  You have pain that wakes you up when you  are sleeping.  You have pain that gets worse when you lie down.  Your pain is worse than you have experienced in the past.  Your pain lasts longer than 4 weeks.  You experience unexplained weight loss. Get help right away if:  You lose control of your bowel or bladder (incontinence).  You have:  Weakness in your lower back, pelvis, buttocks, or legs that gets worse.  Redness or swelling of your back.  A burning sensation when you urinate. This information is not intended to replace advice given to you by your health care provider. Make sure you discuss any questions you have with your health care provider. Document Released: 07/21/2001 Document Revised: 12/31/2015 Document Reviewed: 04/05/2015 Elsevier Interactive Patient Education  2017 [employer].

## 2016-08-12 ENCOUNTER — Encounter: Payer: Self-pay | Admitting: Adult Health

## 2016-08-12 ENCOUNTER — Ambulatory Visit (INDEPENDENT_AMBULATORY_CARE_PROVIDER_SITE_OTHER): Payer: Medicare Other | Admitting: Adult Health

## 2016-08-12 VITALS — BP 142/58 | HR 76 | Temp 99.1°F | Ht 62.0 in | Wt 179.2 lb

## 2016-08-12 DIAGNOSIS — J069 Acute upper respiratory infection, unspecified: Secondary | ICD-10-CM

## 2016-08-12 MED ORDER — BENZONATATE 200 MG PO CAPS: 200.0000 mg | ORAL_CAPSULE | Freq: Two times a day (BID) | ORAL | 0 refills | Status: DC | PRN

## 2016-08-12 MED ORDER — DOXYCYCLINE HYCLATE 100 MG PO CAPS: 100.0000 mg | ORAL_CAPSULE | Freq: Two times a day (BID) | ORAL | 0 refills | Status: DC

## 2016-08-12 NOTE — Progress Notes (Signed)
Subjective:    Patient ID: [name], female    DOB: 03/10/1929, 83 y.o.   MRN: [id number]  URI   This is a new problem. The current episode started in the past 7 days. The problem has been gradually worsening. The maximum temperature recorded prior to her arrival was 100.4 - 100.9 F. The fever has been present for less than 1 day. Associated symptoms include congestion, coughing, headaches, sinus pain and sneezing. Pertinent negatives include no diarrhea, ear pain, nausea, plugged ear sensation or rhinorrhea. She has tried decongestant for the symptoms. The treatment provided mild relief.      Review of Systems  HENT: Positive for congestion, sinus pain and sneezing. Negative for ear pain, nosebleeds, postnasal drip, rhinorrhea and sinus pressure.   Respiratory: Positive for cough, chest tightness and shortness of breath.   Gastrointestinal: Negative for diarrhea and nausea.  Neurological: Positive for headaches.   Past Medical History:  Diagnosis Date  . Anemia   . Chronic fatigue   . Chronic renal insufficiency, stage III (moderate)   . Depression   . HYPERTENSION 04/04/2007  . HYPOTHYROIDISM 04/04/2007  . Macular degeneration     Social History   Social History  . Marital status: Widowed    Spouse name: N/A  . Number of children: N/A  . Years of education: N/A   Occupational History  . Retired Retired   Social History Main Topics  . Smoking status: Former Smoker    Packs/day: 1.00    Years: 7.00    Types: Cigarettes    Quit date: 10/13/1969  . Smokeless tobacco: Not on file  . Alcohol use No  . Drug use: No  . Sexual activity: Not on file   Other Topics Concern  . Not on file   Social History Narrative  . No narrative on file    Past Surgical History:  Procedure Laterality Date  . ABDOMINAL HYSTERECTOMY    . APPENDECTOMY    . BACK SURGERY  2012  . Cataracts    . CHOLECYSTECTOMY    . ERCP N/A 11/22/2014   Procedure: ENDOSCOPIC RETROGRADE  CHOLANGIOPANCREATOGRAPHY (ERCP);  Surgeon: Clarene Essex, MD;  Location[facility] END[facility]COPY;  Service: Endoscopy;  Laterality: N/A;  . HERNIA REPAIR    . OOPHORECTOMY    . ORIF TIBIA FRACTURE  2012   dr duda  . SPINE SURGERY  2011   cervical    Family History  Problem Relation Age of Onset  . Kidney cancer Mother 28    renal cell CA  . Heart disease Father   . Lymphoma Son   . Breast cancer Daughter     Allergies  Allergen Reactions  . Oxycodone Hcl     REACTION: itching    Current Outpatient Prescriptions on File Prior to Visit  Medication Sig Dispense Refill  . amLODipine (NORVASC) 10 MG tablet Take 1 tablet (10 mg total) by mouth daily. 90 tablet 3  . benzonatate (TESSALON) 100 MG capsule Take 1 capsule (100 mg total) by mouth 2 (two) times daily. 20 capsule 2  . cetirizine (ZYRTEC) 10 MG tablet Take 10 mg by mouth daily.    . clidinium-chlordiazePOXIDE (LIBRAX) 2.5-5 MG per capsule 2 (two) times daily as needed.     . cyclobenzaprine (FLEXERIL) 5 MG tablet Take 1 tablet (5 mg total) by mouth at bedtime. 7 tablet 0  . levothyroxine (SYNTHROID, LEVOTHROID) 137 MCG tablet Take 1 tablet (137 mcg total) by mouth daily before breakfast. 90  tablet 3  . Multiple Vitamin (MULTIVITAMIN) tablet Take 1 tablet by mouth daily.    Marland Kitchen olmesartan (BENICAR) 20 MG tablet Take 1 tablet (20 mg total) by mouth daily. 90 tablet 3  . omeprazole (PRILOSEC) 40 MG capsule Take 1 tablet by mouth daily before breakfast.     . traMADol (ULTRAM) 50 MG tablet Take 0.5 tablets (25 mg total) by mouth every 12 (twelve) hours as needed (Do not take near bedtime). 30 tablet 1   No current facility-administered medications on file prior to visit.     BP (!) 142/58   Pulse 76   Temp 99.1 F (37.3 C) (Oral)   Ht 5\' 2"  (1.575 m)   Wt 179 lb 3.2 oz (81.3 kg)   SpO2 96%   BMI 32.78 kg/m       Objective:   Physical Exam  Constitutional: She appears well-developed and well-nourished. No distress.  Neck: Normal  range of motion. Neck supple.  Cardiovascular: Normal rate, regular rhythm, normal heart sounds and intact distal pulses.  Exam reveals no gallop and no friction rub.   No murmur heard. Pulmonary/Chest: Effort normal and breath sounds normal. No respiratory distress. She has no wheezes. She has no rales. She exhibits no tenderness.  Lymphadenopathy:    She has no cervical adenopathy.  Skin: Skin is warm and dry. No rash noted. She is not diaphoretic. No erythema. No pallor.  Psychiatric: She has a normal mood and affect. Her behavior is normal. Thought content normal.  Vitals reviewed.     Assessment & Plan:  1. Acute upper respiratory infection - doxycycline (VIBRAMYCIN) 100 MG capsule; Take 1 capsule (100 mg total) by mouth 2 (two) times daily.  Dispense: 14 capsule; Refill: 0 - benzonatate (TESSALON) 200 MG capsule; Take 1 capsule (200 mg total) by mouth 2 (two) times daily as needed for cough.  Dispense: 20 capsule; Refill: 0 - Stay well hydrated.  - Follow up if no improvement in the next 2-3 days or sooner if needed  Dorothyann Peng, NP

## 2016-12-17 ENCOUNTER — Other Ambulatory Visit: Payer: Self-pay | Admitting: Adult Health

## 2016-12-17 ENCOUNTER — Ambulatory Visit (INDEPENDENT_AMBULATORY_CARE_PROVIDER_SITE_OTHER)
Admission: RE | Admit: 2016-12-17 | Discharge: 2016-12-17 | Disposition: A | Discharge status: Home / Self Care | Payer: Medicare Other | Source: Ambulatory Visit | Attending: Adult Health | Admitting: Adult Health

## 2016-12-17 ENCOUNTER — Ambulatory Visit (INDEPENDENT_AMBULATORY_CARE_PROVIDER_SITE_OTHER): Payer: Medicare Other | Admitting: Adult Health

## 2016-12-17 VITALS — BP 158/80 | Temp 98.0°F | Ht 62.0 in | Wt 176.2 lb

## 2016-12-17 DIAGNOSIS — M25562 Pain in left knee: Secondary | ICD-10-CM | POA: Diagnosis not present

## 2016-12-17 DIAGNOSIS — S299XXA Unspecified injury of thorax, initial encounter: Secondary | ICD-10-CM | POA: Diagnosis not present

## 2016-12-17 DIAGNOSIS — M25572 Pain in left ankle and joints of left foot: Secondary | ICD-10-CM | POA: Diagnosis not present

## 2016-12-17 DIAGNOSIS — R0789 Other chest pain: Secondary | ICD-10-CM | POA: Diagnosis not present

## 2016-12-17 DIAGNOSIS — R109 Unspecified abdominal pain: Secondary | ICD-10-CM

## 2016-12-17 DIAGNOSIS — S99912A Unspecified injury of left ankle, initial encounter: Secondary | ICD-10-CM | POA: Diagnosis not present

## 2016-12-17 NOTE — Progress Notes (Signed)
Subjective:    Patient ID: [name], female    DOB: 07-09-1929, 83 y.o.   MRN: [id number]  HPI   83 year old female who  has a past medical history of Anemia; Chronic fatigue; Chronic renal insufficiency, stage III (moderate); Depression; HYPERTENSION (04/04/2007); HYPOTHYROIDISM (04/04/2007); and Macular degeneration. She presents to the office today s/p mechanical fall while working in the garden. She reports that she tripped on rocks while working outside and landed on her left side, falling on slate rock. She reports that her pain is worse in the left flank and she feels as though pain intensifies when she takes a deep breath or with certain movements.   Also with pain to left knee ( which has been surgically repaired in the past)   She did not hit her head and remembers the entire event.   She has been using Tylenol without relief.    Review of Systems See HPI   Past Medical History:  Diagnosis Date  . Anemia   . Chronic fatigue   . Chronic renal insufficiency, stage III (moderate)   . Depression   . HYPERTENSION 04/04/2007  . HYPOTHYROIDISM 04/04/2007  . Macular degeneration     Social History   Social History  . Marital status: Widowed    Spouse name: N/A  . Number of children: N/A  . Years of education: N/A   Occupational History  . Retired Retired   Social History Main Topics  . Smoking status: Former Smoker    Packs/day: 1.00    Years: 7.00    Types: Cigarettes    Quit date: 10/13/1969  . Smokeless tobacco: Not on file  . Alcohol use No  . Drug use: No  . Sexual activity: Not on file   Other Topics Concern  . Not on file   Social History Narrative  . No narrative on file    Past Surgical History:  Procedure Laterality Date  . ABDOMINAL HYSTERECTOMY    . APPENDECTOMY    . BACK SURGERY  2012  . Cataracts    . CHOLECYSTECTOMY    . ERCP N/A 11/22/2014   Procedure: ENDOSCOPIC RETROGRADE CHOLANGIOPANCREATOGRAPHY (ERCP);  Surgeon: Clarene Essex, MD;   Location: [facility] ENDOSCOPY;  Service: Endoscopy;  Laterality: N/A;  . HERNIA REPAIR    . OOPHORECTOMY    . ORIF TIBIA FRACTURE  2012   dr duda  . SPINE SURGERY  2011   cervical    Family History  Problem Relation Age of Onset  . Kidney cancer Mother 35       renal cell CA  . Heart disease Father   . Lymphoma Son   . Breast cancer Daughter     Allergies  Allergen Reactions  . Oxycodone Hcl     REACTION: itching    Current Outpatient Prescriptions on File Prior to Visit  Medication Sig Dispense Refill  . amLODipine (NORVASC) 10 MG tablet Take 1 tablet (10 mg total) by mouth daily. 90 tablet 3  . cetirizine (ZYRTEC) 10 MG tablet Take 10 mg by mouth daily.    . clidinium-chlordiazePOXIDE (LIBRAX) 2.5-5 MG per capsule 2 (two) times daily as needed.     . cyclobenzaprine (FLEXERIL) 5 MG tablet Take 1 tablet (5 mg total) by mouth at bedtime. 7 tablet 0  . levothyroxine (SYNTHROID, LEVOTHROID) 137 MCG tablet Take 1 tablet (137 mcg total) by mouth daily before breakfast. 90 tablet 3  . Multiple Vitamin (MULTIVITAMIN) tablet Take 1 tablet by  mouth daily.    Marland Kitchen olmesartan (BENICAR) 20 MG tablet Take 1 tablet (20 mg total) by mouth daily. 90 tablet 3  . omeprazole (PRILOSEC) 40 MG capsule Take 1 tablet by mouth daily before breakfast.     . traMADol (ULTRAM) 50 MG tablet Take 0.5 tablets (25 mg total) by mouth every 12 (twelve) hours as needed (Do not take near bedtime). 30 tablet 1   No current facility-administered medications on file prior to visit.     BP (!) 158/80 (BP Location: Right Arm, Patient Position: Sitting, Cuff Size: Normal)   Temp 98 F (36.7 C) (Oral)   Ht 5\' 2"  (1.575 m)   Wt 176 lb 3.2 oz (79.9 kg)   BMI 32.23 kg/m       Objective:   Physical Exam  Constitutional: She is oriented to person, place, and time. She appears well-developed and well-nourished. No distress.  Cardiovascular: Normal rate, regular rhythm, normal heart sounds and intact distal pulses.  Exam  reveals no gallop and no friction rub.   No murmur heard. Pulmonary/Chest: Effort normal and breath sounds normal. No respiratory distress. She has no wheezes. She has no rales. She exhibits no tenderness.  Musculoskeletal: She exhibits tenderness (left lateral flank, left hand, and left flank ). She exhibits no edema.  Bruising noted to left palm  Skin tear to right hand Well healing bruise to left lateral knee No bruising to left flank   Neurological: She is alert and oriented to person, place, and time.  Skin: Skin is warm and dry. No rash noted. She is not diaphoretic. No erythema. No pallor.  Psychiatric: She has a normal mood and affect. Her behavior is normal. Judgment and thought content normal.  Nursing note and vitals reviewed.     Assessment & Plan:  1. Left flank pain - Likely bruise to left flank. Will get x ray to r/o fracture.  - DG Chest 2 View; Future - I am ok with her taking 400mg  Motrin every 8 hours for the next 2-3 days  - Add heating pad - Deep breathing exercises 2. Acute pain of left knee - DG Knee 3 Views Left; Future - Consider referral to orthopedics   Dorothyann Peng, NP

## 2017-01-15 ENCOUNTER — Other Ambulatory Visit: Payer: Self-pay | Admitting: Adult Health

## 2017-01-15 ENCOUNTER — Ambulatory Visit (INDEPENDENT_AMBULATORY_CARE_PROVIDER_SITE_OTHER): Payer: Medicare Other | Admitting: Adult Health

## 2017-01-15 ENCOUNTER — Encounter: Payer: Self-pay | Admitting: Adult Health

## 2017-01-15 VITALS — BP 120/74 | Temp 98.3°F | Ht 62.0 in | Wt 179.4 lb

## 2017-01-15 DIAGNOSIS — I10 Essential (primary) hypertension: Secondary | ICD-10-CM | POA: Diagnosis not present

## 2017-01-15 DIAGNOSIS — N289 Disorder of kidney and ureter, unspecified: Secondary | ICD-10-CM | POA: Diagnosis not present

## 2017-01-15 DIAGNOSIS — E039 Hypothyroidism, unspecified: Secondary | ICD-10-CM

## 2017-01-15 LAB — POC URINALSYSI DIPSTICK (AUTOMATED)
Bilirubin, UA: NEGATIVE
Glucose, UA: NEGATIVE
Ketones, UA: NEGATIVE
Leukocytes, UA: NEGATIVE
Nitrite, UA: NEGATIVE
PH UA: 6 (ref 5.0–8.0)
Protein, UA: NEGATIVE
RBC UA: NEGATIVE
Spec Grav, UA: 1.015 (ref 1.010–1.025)
UROBILINOGEN UA: 0.2 U/dL

## 2017-01-15 LAB — HEPATIC FUNCTION PANEL
ALBUMIN: 4.2 g/dL (ref 3.5–5.2)
ALT: 13 U/L (ref 0–35)
AST: 14 U/L (ref 0–37)
Alkaline Phosphatase: 59 U/L (ref 39–117)
Bilirubin, Direct: 0.1 mg/dL (ref 0.0–0.3)
Total Bilirubin: 0.6 mg/dL (ref 0.2–1.2)
Total Protein: 6.7 g/dL (ref 6.0–8.3)

## 2017-01-15 LAB — CBC WITH DIFFERENTIAL/PLATELET
BASOS PCT: 2.1 % (ref 0.0–3.0)
Basophils Absolute: 0.1 10*3/uL (ref 0.0–0.1)
EOS PCT: 6 % — AB (ref 0.0–5.0)
Eosinophils Absolute: 0.4 10*3/uL (ref 0.0–0.7)
HCT: 35 % — ABNORMAL LOW (ref 36.0–46.0)
Hemoglobin: 11.8 g/dL — ABNORMAL LOW (ref 12.0–15.0)
LYMPHS ABS: 1.7 10*3/uL (ref 0.7–4.0)
Lymphocytes Relative: 27 % (ref 12.0–46.0)
MCHC: 33.6 g/dL (ref 30.0–36.0)
MCV: 90.5 fl (ref 78.0–100.0)
MONOS PCT: 9.4 % (ref 3.0–12.0)
Monocytes Absolute: 0.6 10*3/uL (ref 0.1–1.0)
NEUTROS ABS: 3.6 10*3/uL (ref 1.4–7.7)
NEUTROS PCT: 55.5 % (ref 43.0–77.0)
Platelets: 325 10*3/uL (ref 150.0–400.0)
RBC: 3.87 Mil/uL (ref 3.87–5.11)
RDW: 13.8 % (ref 11.5–15.5)
WBC: 6.5 10*3/uL (ref 4.0–10.5)

## 2017-01-15 LAB — LIPID PANEL
Cholesterol: 174 mg/dL (ref 0–200)
HDL: 37 mg/dL — ABNORMAL LOW (ref >39.00)
LDL Cholesterol: 115 mg/dL — ABNORMAL HIGH (ref 0–99)
NonHDL: 136.94
Total CHOL/HDL Ratio: 5
Triglycerides: 109 mg/dL (ref 0.0–149.0)
VLDL: 21.8 mg/dL (ref 0.0–40.0)

## 2017-01-15 LAB — BASIC METABOLIC PANEL
BUN: 15 mg/dL (ref 6–23)
CHLORIDE: 108 meq/L (ref 96–112)
CO2: 28 meq/L (ref 19–32)
Calcium: 9.3 mg/dL (ref 8.4–10.5)
Creatinine, Ser: 1.18 mg/dL (ref 0.40–1.20)
GFR: 46.19 mL/min — AB (ref >60.00)
GLUCOSE: 96 mg/dL (ref 70–99)
POTASSIUM: 4.2 meq/L (ref 3.5–5.1)
SODIUM: 142 meq/L (ref 135–145)

## 2017-01-15 LAB — TSH: TSH: 6.59 u[IU]/mL — ABNORMAL HIGH (ref 0.35–4.50)

## 2017-01-15 NOTE — Patient Instructions (Signed)
It was great seeing you today   I will follow up with you regarding your blood work.   Continue to stay active   Follow up as needed

## 2017-01-15 NOTE — Progress Notes (Signed)
Subjective:    Patient ID: [name], female    DOB: 08-Jul-1929, 83 y.o.   MRN: [id number]  HPI  Patient presents for her yearly follow up exam. She is a pleasant 83 year old female who  has a past medical history of Anemia; Chronic fatigue; Chronic renal insufficiency, stage III (moderate); Depression; HYPERTENSION (04/04/2007); HYPOTHYROIDISM (04/04/2007); and Macular degeneration.  All immunizations and health maintenance protocols were reviewed with the patient and needed orders were placed.  Appropriate screening laboratory values were ordered for the patient including screening of hyperlipidemia, renal function and hepatic function.  Medication reconciliation,  past medical history, social history, problem list and allergies were reviewed in detail with the patient  Goals were established with regard to weight loss, exercise, and  diet in compliance with medications  She takes synthroid 137 mcg for hypothyroidism  Blood pressure is well controlled with Benicar 40 mg and Amlodipine 10 mg.   BP Readings from Last 3 Encounters:  01/15/17 120/74  12/17/16 (!) 158/80  08/12/16 (!) 142/58   She denies any interval history.   She does not have a living will. She will get paper work today   Overall, she states " I feel blessed for feeling so good at my age".    Review of Systems  Constitutional: Negative.   HENT: Negative.   Eyes: Negative.   Respiratory: Negative.   Cardiovascular: Negative.   Gastrointestinal: Negative.   Endocrine: Negative.   Genitourinary: Negative.   Musculoskeletal: Positive for arthralgias.  Skin: Negative.   Allergic/Immunologic: Negative.   Neurological: Negative.   Hematological: Bruises/bleeds easily.  Psychiatric/Behavioral: Negative.   All other systems reviewed and are negative.  Past Medical History:  Diagnosis Date  . Anemia   . Chronic fatigue   . Chronic renal insufficiency, stage III (moderate)   . Depression   .  HYPERTENSION 04/04/2007  . HYPOTHYROIDISM 04/04/2007  . Macular degeneration     Social History   Social History  . Marital status: Widowed    Spouse name: N/A  . Number of children: N/A  . Years of education: N/A   Occupational History  . Retired Retired   Social History Main Topics  . Smoking status: Former Smoker    Packs/day: 1.00    Years: 7.00    Types: Cigarettes    Quit date: 10/13/1969  . Smokeless tobacco: Never Used  . Alcohol use No  . Drug use: No  . Sexual activity: Not on file   Other Topics Concern  . Not on file   Social History Narrative  . No narrative on file    Past Surgical History:  Procedure Laterality Date  . ABDOMINAL HYSTERECTOMY    . APPENDECTOMY    . BACK SURGERY  2012  . Cataracts    . CHOLECYSTECTOMY    . ERCP N/A 11/22/2014   Procedure: ENDOSCOPIC RETROGRADE CHOLANGIOPANCREATOGRAPHY (ERCP);  Surgeon: Clarene Essex, MD;  Location: [facility] ENDOSCOPY;  Service: Endoscopy;  Laterality: N/A;  . HERNIA REPAIR    . OOPHORECTOMY    . ORIF TIBIA FRACTURE  2012   dr duda  . SPINE SURGERY  2011   cervical    Family History  Problem Relation Age of Onset  . Kidney cancer Mother 65       renal cell CA  . Heart disease Father   . Lymphoma Son   . Breast cancer Daughter     Allergies  Allergen Reactions  . Oxycodone Hcl  REACTION: itching    Current Outpatient Prescriptions on File Prior to Visit  Medication Sig Dispense Refill  . amLODipine (NORVASC) 10 MG tablet Take 1 tablet (10 mg total) by mouth daily. 90 tablet 3  . cetirizine (ZYRTEC) 10 MG tablet Take 10 mg by mouth daily.    . clidinium-chlordiazePOXIDE (LIBRAX) 2.5-5 MG per capsule 2 (two) times daily as needed.     . cyclobenzaprine (FLEXERIL) 5 MG tablet Take 1 tablet (5 mg total) by mouth at bedtime. 7 tablet 0  . levothyroxine (SYNTHROID, LEVOTHROID) 137 MCG tablet Take 1 tablet (137 mcg total) by mouth daily before breakfast. 90 tablet 3  . Multiple Vitamin (MULTIVITAMIN)  tablet Take 1 tablet by mouth daily.    Marland Kitchen olmesartan (BENICAR) 20 MG tablet Take 1 tablet (20 mg total) by mouth daily. 90 tablet 3  . omeprazole (PRILOSEC) 40 MG capsule Take 1 tablet by mouth daily before breakfast.     . traMADol (ULTRAM) 50 MG tablet Take 0.5 tablets (25 mg total) by mouth every 12 (twelve) hours as needed (Do not take near bedtime). 30 tablet 1   No current facility-administered medications on file prior to visit.     BP 120/74 (BP Location: Left Arm, Patient Position: Sitting, Cuff Size: Large)   Temp 98.3 F (36.8 C) (Oral)   Ht 5\' 2"  (1.575 m)   Wt 179 lb 6.4 oz (81.4 kg)   BMI 32.81 kg/m       Objective:   Physical Exam  Constitutional: She is oriented to person, place, and time. She appears well-developed and well-nourished. No distress.  HENT:  Head: Normocephalic and atraumatic.  Right Ear: External ear normal.  Left Ear: External ear normal.  Nose: Nose normal.  Mouth/Throat: Oropharynx is clear and moist. No oropharyngeal exudate.  Eyes: Conjunctivae and EOM are normal. Pupils are equal, round, and reactive to light. Right eye exhibits no discharge. Left eye exhibits no discharge. No scleral icterus.  Neck: Normal range of motion. Neck supple. No JVD present. No tracheal deviation present. No thyromegaly present.  Cardiovascular: Normal rate, regular rhythm, normal heart sounds and intact distal pulses.  Exam reveals no gallop and no friction rub.   No murmur heard. Pulmonary/Chest: Effort normal and breath sounds normal. No stridor. No respiratory distress. She has no wheezes. She has no rales. She exhibits no tenderness.  Abdominal: Soft. Bowel sounds are normal. She exhibits no distension and no mass. There is no tenderness. There is no rebound and no guarding. A hernia is present. Hernia confirmed positive in the right inguinal area.  Musculoskeletal: Normal range of motion. She exhibits no edema, tenderness or deformity.  Walks with a slow steady  gate   Lymphadenopathy:    She has no cervical adenopathy.  Neurological: She is alert and oriented to person, place, and time. She has normal reflexes. She displays normal reflexes. No cranial nerve deficit. She exhibits normal muscle tone. Coordination normal.  Skin: Skin is warm and dry. No rash noted. No erythema. No pallor.  Hematoma on left forearm   Psychiatric: She has a normal mood and affect. Her behavior is normal. Judgment and thought content normal.  Nursing note and vitals reviewed.      Assessment & Plan:  1. Essential hypertension - Well controlled. No change in medications  - Continue to stay active  - Basic metabolic panel - CBC with Differential/Platelet - Hepatic function panel - Lipid panel - TSH - POCT Urinalysis Dipstick (Automated)  2.  Hypothyroidism, unspecified type  - Basic metabolic panel - CBC with Differential/Platelet - Hepatic function panel - Lipid panel - TSH - POCT Urinalysis Dipstick (Automated) - Consider increasing synthroid   3. Renal insufficiency  - Basic metabolic panel  Dorothyann Peng, NP

## 2017-02-08 ENCOUNTER — Other Ambulatory Visit: Payer: Self-pay | Admitting: Adult Health

## 2017-02-08 DIAGNOSIS — I10 Essential (primary) hypertension: Secondary | ICD-10-CM

## 2017-02-11 ENCOUNTER — Other Ambulatory Visit (INDEPENDENT_AMBULATORY_CARE_PROVIDER_SITE_OTHER): Payer: Medicare Other

## 2017-02-11 DIAGNOSIS — Z23 Encounter for immunization: Secondary | ICD-10-CM | POA: Diagnosis not present

## 2017-02-11 DIAGNOSIS — E039 Hypothyroidism, unspecified: Secondary | ICD-10-CM | POA: Diagnosis not present

## 2017-02-11 LAB — T4, FREE: Free T4: 0.76 ng/dL (ref 0.60–1.60)

## 2017-02-11 LAB — T3, FREE: T3 FREE: 2.9 pg/mL (ref 2.3–4.2)

## 2017-02-24 ENCOUNTER — Telehealth: Payer: Self-pay | Admitting: Adult Health

## 2017-02-24 NOTE — Telephone Encounter (Signed)
Printed and sent by mail per pt request.

## 2017-02-24 NOTE — Telephone Encounter (Signed)
Pt would like blood work from 01-15-17 to be mail to her home address. Pt is needing creatinini level

## 2017-02-24 NOTE — Telephone Encounter (Signed)
Pt would like to have her lab results from 01/15/17 and 02/11/17 mailed to confirmed address on file.

## 2017-04-02 ENCOUNTER — Other Ambulatory Visit: Payer: Self-pay | Admitting: Adult Health

## 2017-04-02 DIAGNOSIS — E039 Hypothyroidism, unspecified: Secondary | ICD-10-CM

## 2017-04-02 NOTE — Telephone Encounter (Signed)
Sent to the pharmacy by e-scribe. 

## 2017-04-05 DIAGNOSIS — Z961 Presence of intraocular lens: Secondary | ICD-10-CM | POA: Diagnosis not present

## 2017-06-04 ENCOUNTER — Other Ambulatory Visit: Payer: Self-pay | Admitting: Adult Health

## 2017-06-04 DIAGNOSIS — I10 Essential (primary) hypertension: Secondary | ICD-10-CM

## 2017-06-08 NOTE — Telephone Encounter (Signed)
Sent to the pharmacy by e-scribe. 

## 2017-06-11 NOTE — Progress Notes (Addendum)
Subjective:   [name] is a 83 y.o. female who presents for Medicare Annual (Subsequent) preventive examination.  The Patient was informed that the wellness visit is to identify future health risk and educate and initiate measures that can reduce risk for increased disease through the lifespan.    Annual Wellness Assessment  Reports health as good  Lives in town home; ground floor [place] in 2013 and broke left leg; had to crawl to get help through the garage to laundry and had pocket book was on dryer Not going to be intimidated by her age 57 gives you strength when you need it. No future plans to carry a phone with her but states she is careful  Preventive Screening -Counseling & Management  Medicare Annual Preventive Care Visit - Subsequent Last OV 04/2017  Colonoscopy 12/2012 Mammogram 07/2015  Health Maintenance Due  Topic Date Due  . DEXA SCAN  05/12/1996  . INFLUENZA VACCINE  03/10/2017   Will take her flu vaccine at the drug store   States her bone density was done after she broke her leg; States it was normal    VS reviewed; BP elevated but had bacon this am for breakfast   Diet  Breakfast; not cooking much Goes out to eat Lives near restaurants  [employer] on Sunday with her son Has someone assists her in the home for cleaning   BMI is 32   Exercise Does not do anything, but walks around the home. Needs a handicapped sticker;   Stressors: appears tired and stressed;  Focus is not as good Biggest c/o is she is tired from lack of sleep Can't walk very far until she can't continue Almost fell due to tripping  States she fell this past spring on the left side and still hurts   Sleep patterns: states she is losing sleep Wakes up at 2 am and can't go back to sleep  Generally wakes up to go to the bathroom Hx an issue for a long time  Goes to sleep during the day in the recliner   Pain; in side at times     Cardiac Risk Factors  Addressed Hyperlipidemia- ratio is 5- chol 174; hdl 37; trig 109  Diabetes - neg  Advanced Directives completed but may update   Patient Care Team: Dorothyann Peng, NP as PCP - General (Family Medicine)   Cardiac Risk Factors include: advanced age (>7men, >59 women);family history of premature cardiovascular disease;hypertension;obesity (BMI >30kg/m2)     Objective:     Vitals: BP (!) 160/60   Pulse 67   Ht 5\' 2"  (1.575 m)   Wt 179 lb (81.2 kg)   SpO2 95%   BMI 32.74 kg/m   Body mass index is 32.74 kg/m.   Tobacco Social History   Tobacco Use  Smoking Status Former Smoker  . Packs/day: 1.00  . Years: 7.00  . Pack years: 7.00  . Types: Cigarettes  . Last attempt to quit: 10/13/1969  . Years since quitting: 47.7  Smokeless Tobacco Never Used     Counseling given: Yes   Past Medical History:  Diagnosis Date  . Anemia   . Chronic fatigue   . Chronic renal insufficiency, stage III (moderate) (HCC)   . Depression   . HYPERTENSION 04/04/2007  . HYPOTHYROIDISM 04/04/2007  . Macular degeneration    Past Surgical History:  Procedure Laterality Date  . ABDOMINAL HYSTERECTOMY    . APPENDECTOMY    . BACK SURGERY  2012  .  Cataracts    . CHOLECYSTECTOMY    . HERNIA REPAIR    . OOPHORECTOMY    . ORIF TIBIA FRACTURE  2012   dr duda  . SPINE SURGERY  2011   cervical   Family History  Problem Relation Age of Onset  . Kidney cancer Mother 83       renal cell CA  . Heart disease Father   . Lymphoma Son   . Breast cancer Daughter    Social History   Substance and Sexual Activity  Sexual Activity Not on file    Outpatient Encounter Medications as of 06/15/2017  Medication Sig  . amLODipine (NORVASC) 10 MG tablet TAKE 1 TABLET DAILY  . cetirizine (ZYRTEC) 10 MG tablet Take 10 mg by mouth daily.  Marland Kitchen levothyroxine (SYNTHROID, LEVOTHROID) 137 MCG tablet TAKE 1 TABLET DAILY BEFORE BREAKFAST  . Multiple Vitamin (MULTIVITAMIN) tablet Take 1 tablet by mouth daily.  Marland Kitchen  olmesartan (BENICAR) 20 MG tablet TAKE 1 TABLET DAILY  . omeprazole (PRILOSEC) 40 MG capsule Take 1 tablet by mouth daily before breakfast.   . clidinium-chlordiazePOXIDE (LIBRAX) 2.5-5 MG per capsule 2 (two) times daily as needed.   . cyclobenzaprine (FLEXERIL) 5 MG tablet Take 1 tablet (5 mg total) by mouth at bedtime. (Patient not taking: Reported on 06/15/2017)  . traMADol (ULTRAM) 50 MG tablet Take 0.5 tablets (25 mg total) by mouth every 12 (twelve) hours as needed (Do not take near bedtime). (Patient not taking: Reported on 06/15/2017)   No facility-administered encounter medications on file as of 06/15/2017.     Activities of Daily Living In your present state of health, do you have any difficulty performing the following activities: 06/15/2017  Hearing? Y  Comment referred for heairng aids to whereever she would like to go   Vision? Y  Difficulty concentrating or making decisions? N  Walking or climbing stairs? Y  Dressing or bathing? N  Doing errands, shopping? N  Preparing Food and eating ? N  Using the Toilet? N  In the past six months, have you accidently leaked urine? Y  Comment up at hs and goes frequently   Do you have problems with loss of bowel control? N  Managing your Medications? N  Managing your Finances? N  Housekeeping or managing your Housekeeping? N  Some recent data might be hidden    Patient Care Team: Dorothyann Peng, NP as PCP - General (Family Medicine)    Assessment:   Exercise Activities and Dietary recommendations Current Exercise Habits: Home exercise routine, Intensity: Mild  Goals    None     Fall Risk Fall Risk  01/15/2017 04/06/2016 11/05/2014 08/09/2013 06/23/2013  Falls in the past year? Yes No No No No  Comment - Emmi Telephone Survey: data to providers prior to load - - -  Number falls in past yr: 1 - - - -  Injury with Fall? Yes - - - -   Depression Screen PHQ 2/9 Scores 06/15/2017 01/15/2017 11/05/2014 08/09/2013  PHQ - 2 Score 2 0 0 1   PHQ- 9 Score 9 - - -     Cognitive Function MMSE - Mini Mental State Exam 06/15/2017  Not completed: (No Data)     MMS - not completed due to low mood and focus; Lack of sleep with mild depression or low mood. PhQ is 9 but denies depressed mood and but does exhibit some features whether due to lack of sleep or other Requested she discuss medication with [name]  and agreed to come in this week Also given information on Sleep hygiene and a workbook on  "Saying Goodnight to Insomnia".      Immunization History  Administered Date(s) Administered  . Influenza Split 05/17/2012  . Influenza Whole 08/18/2007, 05/24/2009, 05/06/2010, 05/10/2012  . Influenza, High Dose Seasonal PF 06/23/2013, 07/25/2014, 07/07/2016  . Influenza-Unspecified 07/15/2015  . Pneumococcal Conjugate-13 02/11/2017  . Pneumococcal Polysaccharide-23 08/18/2007  . Tdap 09/07/2011   Screening Tests Health Maintenance  Topic Date Due  . DEXA SCAN  05/12/1996  . INFLUENZA VACCINE  03/10/2017  . TETANUS/TDAP  09/06/2021  . PNA vac Low Risk Adult  Completed      Plan:     PCP Notes  Health Maintenance   States her bone density was done after she broke her leg; States it was normal  States she has good bones and removed dexa from [facility]   Declined flu as states she was charged last year and will take it at the [facility] this year   Abnormal Screens  Last GFR 46; creatinine was normal  Reassured kidney status was good   Referrals  - depression screen face to face x 15 minutes  Referred back to [facility] for assessment and evaluation of depression in lieu of lack of sleep ongoing  Up at hs to void and can't go back to sleep Educated on sleep hygiene  Agreed to see [name] on Thursday for low mood; with depressed features, Phq 9;  Agreed medication may assist her to relax and sleep better and help her to focus when up. Agreed to try if [name] feels it may help   Patient concerns; Lack of sleep   Nurse Concerns; As  noted   Next PCP apt Thursday at 8:30 30 minute apt     I have personally reviewed and noted the following in the patient's chart:   . Medical and social history . Use of alcohol, tobacco or illicit drugs  . Current medications and supplements . Functional ability and status . Nutritional status . Physical activity . Advanced directives . List of other physicians . Hospitalizations, surgeries, and ER visits in previous 12 months . Vitals . Screenings to include cognitive, depression, and falls . Referrals and appointments  In addition, I have reviewed and discussed with patient certain preventive protocols, quality metrics, and best practice recommendations. A written personalized care plan for preventive services as well as general preventive health recommendations were provided to patient.     Wynetta Fines, RN  06/15/2017

## 2017-06-15 ENCOUNTER — Ambulatory Visit: Payer: Medicare Other

## 2017-06-15 VITALS — BP 160/60 | HR 67 | Ht 62.0 in | Wt 179.0 lb

## 2017-06-15 DIAGNOSIS — Z1331 Encounter for screening for depression: Secondary | ICD-10-CM

## 2017-06-15 DIAGNOSIS — Z0000 Encounter for general adult medical examination without abnormal findings: Secondary | ICD-10-CM

## 2017-06-15 NOTE — Patient Instructions (Addendum)
[name] , Thank you for taking time to come for your Medicare Wellness Visit. I appreciate your ongoing commitment to your health goals. Please review the following plan we discussed and let me know if I can assist you in the future.   Keep in mind the flu shot is an inactivated vaccine and takes at least 2 weeks to build immunity. The flu virus can be dormant for 4 days prior to symptoms Taking the flu shot at the beginning of the season can reduce the risk for the entire community.  Will take at the Drug store soon  [place] [facility] Insomnia by [facility].d 6 week; drug free program that helps with sleep   Medicare does pay for a hearing screen [facility] - can assist with hearing aid x 1  No reviews  [address] 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 DEXA scan (bone density measurement)  05/12/1996  . Flu Shot  03/10/2017  . Tetanus Vaccine  09/06/2021  . Pneumonia vaccines  Completed    Insomnia Insomnia is a sleep disorder that makes it difficult to fall asleep or to stay asleep. Insomnia can cause tiredness (fatigue), low energy, difficulty concentrating, mood swings, and poor performance at work or school. There are three different ways to  classify insomnia:  Difficulty falling asleep.  Difficulty staying asleep.  Waking up too early in the morning.  Any type of insomnia can be long-term (chronic) or short-term (acute). Both are common. Short-term insomnia usually lasts for three months or less. Chronic insomnia occurs at least three times a week for longer than three months. What are the causes? Insomnia may be caused by another condition, situation, or substance, such as:  Anxiety.  Certain medicines.  Gastroesophageal reflux disease (GERD) or other gastrointestinal conditions.  Asthma or other breathing conditions.  Restless legs syndrome, sleep apnea, or other sleep disorders.  Chronic pain.  Menopause. This may include hot flashes.  Stroke.  Abuse of alcohol, tobacco, or illegal drugs.  Depression.  Caffeine.  Neurological disorders, such as Alzheimer disease.  An overactive thyroid (hyperthyroidism).  The cause of insomnia may not be known. What increases the risk? Risk factors for insomnia include:  Gender. Women are more commonly affected than men.  Age. Insomnia is more common as you get older.  Stress. This may involve your professional or personal life.  Income. Insomnia is more common in people with lower income.  Lack of exercise.  Irregular work schedule or night shifts.  Traveling between different time zones.  What are the signs or symptoms? If you have insomnia, trouble falling asleep or trouble staying asleep is the main  symptom. This may lead to other symptoms, such as:  Feeling fatigued.  Feeling nervous about going to sleep.  Not feeling rested in the morning.  Having trouble concentrating.  Feeling irritable, anxious, or depressed.  How is this treated? Treatment for insomnia depends on the cause. If your insomnia is caused by an underlying condition, treatment will focus on addressing the condition. Treatment may also include:  Medicines to help you  sleep.  Counseling or therapy.  Lifestyle adjustments.  Follow these instructions at home:  Take medicines only as directed by your health care provider.  Keep regular sleeping and waking hours. Avoid naps.  Keep a sleep diary to help you and your health care provider figure out what could be causing your insomnia. Include: ? When you sleep. ? When you wake up during the night. ? How well you sleep. ? How rested you feel the next day. ? Any side effects of medicines you are taking. ? What you eat and drink.  Make your bedroom a comfortable place where it is easy to fall asleep: ? Put up shades or special blackout curtains to block light from outside. ? Use a white noise machine to block noise. ? Keep the temperature cool.  Exercise regularly as directed by your health care provider. Avoid exercising right before bedtime.  Use relaxation techniques to manage stress. Ask your health care provider to suggest some techniques that may work well for you. These may include: ? Breathing exercises. ? Routines to release muscle tension. ? Visualizing peaceful scenes.  Cut back on alcohol, caffeinated beverages, and cigarettes, especially close to bedtime. These can disrupt your sleep.  Do not overeat or eat spicy foods right before bedtime. This can lead to digestive discomfort that can make it hard for you to sleep.  Limit screen use before bedtime. This includes: ? Watching TV. ? Using your smartphone, tablet, and computer.  Stick to a routine. This can help you fall asleep faster. Try to do a quiet activity, brush your teeth, and go to bed at the same time each night.  Get out of bed if you are still awake after 15 minutes of trying to sleep. Keep the lights down, but try reading or doing a quiet activity. When you feel sleepy, go back to bed.  Make sure that you drive carefully. Avoid driving if you feel very sleepy.  Keep all follow-up appointments as directed by your health  care provider. This is important. Contact a health care provider if:  You are tired throughout the day or have trouble in your daily routine due to sleepiness.  You continue to have sleep problems or your sleep problems get worse. Get help right away if:  You have serious thoughts about hurting yourself or someone else. This information is not intended to replace advice given to you by your health care provider. Make sure you discuss any questions you have with your health care provider. Document Released: 07/24/2000 Document Revised: 12/27/2015 Document Reviewed: 04/27/2014 Elsevier Interactive Patient Education  [employer].

## 2017-06-15 NOTE — Progress Notes (Signed)
I have reviewed and agree with this plan  

## 2017-06-17 ENCOUNTER — Encounter: Payer: Self-pay | Admitting: Adult Health

## 2017-06-17 ENCOUNTER — Ambulatory Visit (INDEPENDENT_AMBULATORY_CARE_PROVIDER_SITE_OTHER): Payer: Medicare Other | Admitting: Adult Health

## 2017-06-17 VITALS — BP 148/58 | HR 64 | Temp 98.3°F | Wt 178.0 lb

## 2017-06-17 DIAGNOSIS — G4700 Insomnia, unspecified: Secondary | ICD-10-CM

## 2017-06-17 DIAGNOSIS — Z23 Encounter for immunization: Secondary | ICD-10-CM | POA: Diagnosis not present

## 2017-06-17 DIAGNOSIS — F329 Major depressive disorder, single episode, unspecified: Secondary | ICD-10-CM

## 2017-06-17 MED ORDER — TRAZODONE HCL 50 MG PO TABS: 25.0000 mg | ORAL_TABLET | Freq: Every day | ORAL | 1 refills | Status: DC

## 2017-06-17 NOTE — Progress Notes (Signed)
Subjective:    Patient ID: [name], female    DOB: [birth year], 83 y.o.   MRN: [id number]  HPI 83 year old female who  has a past medical history of Anemia, Chronic fatigue, Chronic renal insufficiency, stage III (moderate) ([place]), Depression, HYPERTENSION (04/04/2007), HYPOTHYROIDISM (04/04/2007), and Macular degeneration.  She presents to the office today to discuss depression and insomnia. She had recently had her Medicare Wellness Exam done and her PHQ 9 score was 9. She reports that she has started feeling as though she does not want to leave the house, but she will make herself do so. She contributes family issues to her depression but she does not want to elaborate on this. She also feels as though " I am living in a world by myself. I cannot keep up with all this computer stuff, in my day it was pencil and paper and now everything is electronic." She denies any suicidal ideation and she feels safe at home.   In regard to insomnia she will often wake up at 2-3 am and not be able to fall asleep.  This has been an issue for most of her life. She will go to bed between 7 - 10 pm.    Review of Systems   See HPI   Past Medical History:  Diagnosis Date  . Anemia   . Chronic fatigue   . Chronic renal insufficiency, stage III (moderate) (H[facility])   . Depression   . HYPERTENSION 04/04/2007  . HYPOTHYROIDISM 04/04/2007  . Macular degeneration     Social History   Socioeconomic History  . Marital status: Widowed    Spouse name: Not on file  . Number of children: Not on file  . Years of education: Not on file  . Highest education level: Not on file  Social Needs  . Financial resource strain: Not on file  . Food insecurity - worry: Not on file  . Food insecurity - inability: Not on file  . Transportation needs - medical: Not on file  . Transportation needs - non-medical: Not on file  Occupational History  . Occupation: Retired    Fish farm manager: RETIRED  Tobacco Use  . Smoking status:  Former Smoker    Packs/day: 1.00    Years: 7.00    Pack years: 7.00    Types: Cigarettes    Last attempt to quit: 10/13/1969    Years since quitting: 47.7  . Smokeless tobacco: Never Used  Substance and Sexual Activity  . Alcohol use: No  . Drug use: No  . Sexual activity: Not on file  Other Topics Concern  . Not on file  Social History Narrative  . Not on file    Past Surgical History:  Procedure Laterality Date  . ABDOMINAL HYSTERECTOMY    . APPENDECTOMY    . BACK SURGERY  2012  . Cataracts    . CHOLECYSTECTOMY    . HERNIA REPAIR    . OOPHORECTOMY    . ORIF TIBIA FRACTURE  2012   dr duda  . SPINE SURGERY  2011   cervical    Family History  Problem Relation Age of Onset  . Kidney cancer Mother 79       renal cell CA  . Heart disease Father   . Lymphoma Son   . Breast cancer Daughter     Allergies  Allergen Reactions  . Oxycodone Hcl     REACTION: itching    Current Outpatient Medications on File Prior to  Visit  Medication Sig Dispense Refill  . amLODipine (NORVASC) 10 MG tablet TAKE 1 TABLET DAILY 90 tablet 3  . cetirizine (ZYRTEC) 10 MG tablet Take 10 mg by mouth daily.    . clidinium-chlordiazePOXIDE (LIBRAX) 2.5-5 MG per capsule 2 (two) times daily as needed.     . cyclobenzaprine (FLEXERIL) 5 MG tablet Take 1 tablet (5 mg total) by mouth at bedtime. 7 tablet 0  . levothyroxine (SYNTHROID, LEVOTHROID) 137 MCG tablet TAKE 1 TABLET DAILY BEFORE BREAKFAST 90 tablet 2  . Multiple Vitamin (MULTIVITAMIN) tablet Take 1 tablet by mouth daily.    Marland Kitchen olmesartan (BENICAR) 20 MG tablet TAKE 1 TABLET DAILY 90 tablet 2  . omeprazole (PRILOSEC) 40 MG capsule Take 1 tablet by mouth daily before breakfast.     . traMADol (ULTRAM) 50 MG tablet Take 0.5 tablets (25 mg total) by mouth every 12 (twelve) hours as needed (Do not take near bedtime). 30 tablet 1   No current facility-administered medications on file prior to visit.     BP (!) 148/58 (BP Location: Left Arm,  Patient Position: Sitting, Cuff Size: Normal)   Pulse 64   Temp 98.3 F (36.8 C) (Oral)   Wt 178 lb (80.7 kg)   SpO2 98%   BMI 32.56 kg/m       Objective:   Physical Exam  Constitutional: She is oriented to person, place, and time. She appears well-developed and well-nourished. No distress.  Cardiovascular: Normal rate, regular rhythm, normal heart sounds and intact distal pulses. Exam reveals no gallop.  No murmur heard. Pulmonary/Chest: Effort normal and breath sounds normal. No respiratory distress. She has no wheezes. She has no rales. She exhibits no tenderness.  Neurological: She is alert and oriented to person, place, and time.  Skin: Skin is warm and dry. She is not diaphoretic.  Psychiatric: She has a normal mood and affect. Her behavior is normal. Judgment and thought content normal.  Tearful during exam    Nursing note and vitals reviewed.      Assessment & Plan:  I am gong to trial her on Trazodone 25 mg QHS. I am hoping that she will get better sleep and this should also help with her mild depression. I advised to go to the ER with any thoughts of SI. Follow up in one month or sooner if needed .Advised on fall precautions due to going on a medication that causes sleepiness.   Dorothyann Peng, NP

## 2017-06-17 NOTE — Patient Instructions (Addendum)
I have sent in a prescription for Trazodone. This will help you sleep and help with your mild depression. Please take 1/2 pill before bedtime.   I would like to see you back in one month

## 2017-06-28 ENCOUNTER — Ambulatory Visit: Payer: Self-pay | Admitting: *Deleted

## 2017-06-28 NOTE — Telephone Encounter (Signed)
   Answer Assessment - Initial Assessment Questions 1. ONSET: "When did the pain begin?"      Day after cleaning 2. LOCATION: "Where does it hurt?"      Neck, shoulder,arm- moved from R to L 3. PATTERN "Does the pain come and go, or has it been constant since it started?"      Constant pain- can feel it- but hurts more with movement. 4. SEVERITY: "How bad is the pain?"  (Scale 1-10; or mild, moderate, severe)   - MILD (1-3): doesn't interfere with normal activities    - MODERATE (4-7): interferes with normal activities or awakens from sleep    - SEVERE (8-10):  excruciating pain, unable to do any normal activities      8-9 5. RADIATION: "Does the pain go anywhere else, shoot into your arms?"     No - just that area 6. CORD SYMPTOMS: "Any weakness or numbness of the arms or legs?"     no 7. CAUSE: "What do you think is causing the neck pain?"     Over work cleaning lights in bathroom with duster 8. NECK OVERUSE: "Any recent activities that involved turning or twisting the neck?"     Rubbing and reaching 9. OTHER SYMPTOMS: "Do you have any other symptoms?" (e.g., headache, fever, chest pain, difficulty breathing, neck swelling)     Some swelling in neck- muscle 10. PREGNANCY: "Is there any chance you are pregnant?" "When was your last menstrual period?"       n/a  Answer Assessment - Initial Assessment Questions 1. MECHANISM: "How did the injury happen?" (Consider the possibility of domestic violence or elder abuse)     Cleaning the bathroom lights-reaching 2. ONSET: "When did the injury happen?" (Minutes or hours ago)     saturday 3. LOCATION: "What part of the neck is injured?"     Left side- now right is hurting- muscle seems swollen 4. SEVERITY: "Can you move the neck normally?"     No- it hurts 5. PAIN: "Is there any pain?" If so, ask: "How bad is the pain?"   (Scale 1-10; or mild, moderate, severe)     8-9 6. CORD SYMPTOMS: "Any weakness or numbness of the arms or legs?"  no 7. SIZE: For cuts, bruises, or swelling, ask: "How large is it?" (e.g., inches or centimeters)      Swelling of muscle in neck 8. TETANUS: For any breaks in the skin, ask: "When was the last tetanus booster?"     unknown 9. OTHER SYMPTOMS: "Do you have any other symptoms?" (e.g., headache)     no 10. PREGNANCY: "Is there any chance you are pregnant?" "When was your last menstrual period?"       n/a    Patient states she was cleaning bathroom lights with duster and she woke up the next day in pain. She was doing a lot of reaching and stretching. She states the pain started on the Left and has now moved to the Right. Neck -shoulder and arm  Protocols used: NECK PAIN OR STIFFNESS-A-AH, NECK INJURY-A-AH

## 2017-06-29 ENCOUNTER — Ambulatory Visit: Payer: Medicare Other | Admitting: Adult Health

## 2017-07-16 ENCOUNTER — Ambulatory Visit: Payer: Medicare Other | Admitting: Adult Health

## 2017-07-20 ENCOUNTER — Ambulatory Visit: Payer: Medicare Other | Admitting: Adult Health

## 2017-07-29 ENCOUNTER — Ambulatory Visit (INDEPENDENT_AMBULATORY_CARE_PROVIDER_SITE_OTHER): Payer: Medicare Other | Admitting: Adult Health

## 2017-07-29 ENCOUNTER — Encounter: Payer: Self-pay | Admitting: Adult Health

## 2017-07-29 VITALS — BP 140/82 | Temp 98.0°F | Wt 176.4 lb

## 2017-07-29 DIAGNOSIS — R4189 Other symptoms and signs involving cognitive functions and awareness: Secondary | ICD-10-CM

## 2017-07-29 DIAGNOSIS — F5101 Primary insomnia: Secondary | ICD-10-CM

## 2017-07-29 DIAGNOSIS — F339 Major depressive disorder, recurrent, unspecified: Secondary | ICD-10-CM | POA: Diagnosis not present

## 2017-07-29 MED ORDER — TRAZODONE HCL 50 MG PO TABS: 50.0000 mg | ORAL_TABLET | Freq: Every day | ORAL | 1 refills | Status: DC

## 2017-07-29 NOTE — Patient Instructions (Signed)
I have increased your Trazodone from 25 to 50 mg to see if this helps with your sleep issue   Please follow up with me in one month

## 2017-07-29 NOTE — Progress Notes (Signed)
Subjective:    Patient ID: [name], female    DOB: 1929/02/23, 83 y.o.   MRN: [id number]  HPI  83 year old female who  has a past medical history of Anemia, Chronic fatigue, Chronic renal insufficiency, stage III (moderate) ([place]), Depression, HYPERTENSION (04/04/2007), HYPOTHYROIDISM (04/04/2007), and Macular degeneration.  She presents to the office today for one month follow up regarding Depression and Anxiety. During her last visit she was started on Trazodone 25 mg QHS. She reports that she feels as though her depression has improved but she is still is having trouble sleeping   She would also like to discuss medication for memory. Her biggest complaint is that of forgetting names. She is not getting lost while driving or forgetting where he put her keys or purse. She would like to know if she has dementia and if there are any medications to help with memory   Review of Systems See HPI   Past Medical History:  Diagnosis Date  . Anemia   . Chronic fatigue   . Chronic renal insufficiency, stage III (moderate) (HCC)   . Depression   . HYPERTENSION 04/04/2007  . HYPOTHYROIDISM 04/04/2007  . Macular degeneration     Social History   Socioeconomic History  . Marital status: Widowed    Spouse name: Not on file  . Number of children: Not on file  . Years of education: Not on file  . Highest education level: Not on file  Social Needs  . Financial resource strain: Not on file  . Food insecurity - worry: Not on file  . Food insecurity - inability: Not on file  . Transportation needs - medical: Not on file  . Transportation needs - non-medical: Not on file  Occupational History  . Occupation: Retired    Fish farm manager: RETIRED  Tobacco Use  . Smoking status: Former Smoker    Packs/day: 1.00    Years: 7.00    Pack years: 7.00    Types: Cigarettes    Last attempt to quit: 10/13/1969    Years since quitting: 47.8  . Smokeless tobacco: Never Used  Substance and Sexual Activity  .  Alcohol use: No  . Drug use: No  . Sexual activity: Not on file  Other Topics Concern  . Not on file  Social History Narrative  . Not on file    Past Surgical History:  Procedure Laterality Date  . ABDOMINAL HYSTERECTOMY    . APPENDECTOMY    . BACK SURGERY  2012  . Cataracts    . CHOLECYSTECTOMY    . ERCP N/A 11/22/2014   Procedure: ENDOSCOPIC RETROGRADE CHOLANGIOPANCREATOGRAPHY (ERCP);  Surgeon: Clarene Essex, MD;  Location: [facility] ENDOSCOPY;  Service: Endoscopy;  Laterality: N/A;  . HERNIA REPAIR    . OOPHORECTOMY    . ORIF TIBIA FRACTURE  2012   dr duda  . SPINE SURGERY  2011   cervical    Family History  Problem Relation Age of Onset  . Kidney cancer Mother 52       renal cell CA  . Heart disease Father   . Lymphoma Son   . Breast cancer Daughter     Allergies  Allergen Reactions  . Oxycodone Hcl     REACTION: itching    Current Outpatient Medications on File Prior to Visit  Medication Sig Dispense Refill  . amLODipine (NORVASC) 10 MG tablet TAKE 1 TABLET DAILY 90 tablet 3  . cetirizine (ZYRTEC) 10 MG tablet Take 10 mg by  mouth daily.    . clidinium-chlordiazePOXIDE (LIBRAX) 2.5-5 MG per capsule 2 (two) times daily as needed.     Marland Kitchen levothyroxine (SYNTHROID, LEVOTHROID) 137 MCG tablet TAKE 1 TABLET DAILY BEFORE BREAKFAST 90 tablet 2  . Multiple Vitamin (MULTIVITAMIN) tablet Take 1 tablet by mouth daily.    Marland Kitchen olmesartan (BENICAR) 20 MG tablet TAKE 1 TABLET DAILY 90 tablet 2  . omeprazole (PRILOSEC) 40 MG capsule Take 1 tablet by mouth daily before breakfast.     . traMADol (ULTRAM) 50 MG tablet Take 0.5 tablets (25 mg total) by mouth every 12 (twelve) hours as needed (Do not take near bedtime). 30 tablet 1   No current facility-administered medications on file prior to visit.     BP 140/82 (BP Location: Left Arm, Patient Position: Sitting, Cuff Size: Normal)   Temp 98 F (36.7 C) (Oral)   Wt 176 lb 6.4 oz (80 kg)   BMI 32.26 kg/m       Objective:   Physical  Exam  Constitutional: She is oriented to person, place, and time. She appears well-developed and well-nourished. No distress.  Cardiovascular: Normal rate, regular rhythm, normal heart sounds and intact distal pulses. Exam reveals no gallop and no friction rub.  No murmur heard. Pulmonary/Chest: Effort normal and breath sounds normal. No respiratory distress. She has no wheezes. She has no rales. She exhibits no tenderness.  Musculoskeletal: Normal range of motion. She exhibits no edema, tenderness or deformity.  Neurological: She is alert and oriented to person, place, and time.  Skin: Skin is warm and dry. No rash noted. She is not diaphoretic. No erythema. No pallor.  Psychiatric: She has a normal mood and affect. Her behavior is normal. Judgment and thought content normal.  Nursing note and vitals reviewed.     Assessment & Plan:  1. Primary insomnia  - traZODone (DESYREL) 50 MG tablet; Take 1 tablet (50 mg total) by mouth at bedtime.  Dispense: 30 tablet; Refill: 1 - Follow up in one month   2. Depression, recurrent ([place])  - traZODone (DESYREL) 50 MG tablet; Take 1 tablet (50 mg total) by mouth at bedtime.  Dispense: 30 tablet; Refill: 1  3. Cognitive impairment - Mini mental Exam 27/30 ( she missed delayed word recall).  _ no need to start medication at this tim e - Advised brain games such as crosswords or sodoku   Dorothyann Peng, NP

## 2017-08-12 ENCOUNTER — Other Ambulatory Visit: Payer: Self-pay | Admitting: Adult Health

## 2017-08-12 NOTE — Telephone Encounter (Signed)
Denied.  Filled on 12/0/18.  Message sent to the pharmacy to check file.

## 2017-08-13 ENCOUNTER — Other Ambulatory Visit: Payer: Self-pay | Admitting: Gastroenterology

## 2017-08-13 DIAGNOSIS — R932 Abnormal findings on diagnostic imaging of liver and biliary tract: Secondary | ICD-10-CM | POA: Diagnosis not present

## 2017-08-13 DIAGNOSIS — R1030 Lower abdominal pain, unspecified: Secondary | ICD-10-CM

## 2017-08-13 DIAGNOSIS — R109 Unspecified abdominal pain: Secondary | ICD-10-CM | POA: Diagnosis not present

## 2017-08-22 ENCOUNTER — Inpatient Hospital Stay
Admission: RE | Admit: 2017-08-22 | Discharge: 2017-08-22 | Disposition: A | Discharge status: Home / Self Care | Payer: Medicare Other | Source: Ambulatory Visit | Attending: Gastroenterology | Admitting: Gastroenterology

## 2017-08-23 ENCOUNTER — Telehealth: Payer: Self-pay | Admitting: Adult Health

## 2017-08-23 DIAGNOSIS — E039 Hypothyroidism, unspecified: Secondary | ICD-10-CM

## 2017-08-23 NOTE — Telephone Encounter (Signed)
Pt in the office requesting Rx levothyroxine (Synthroid)  Pharm:  [facility].  Pt state that she has new pharmacy and if she should need something called in locally it should be called in to [facility] on [address] she will call back with the correct phone # and address.

## 2017-08-24 MED ORDER — LEVOTHYROXINE SODIUM 137 MCG PO TABS: 137.0000 ug | ORAL_TABLET | Freq: Every day | ORAL | 1 refills | Status: DC

## 2017-08-24 NOTE — Telephone Encounter (Signed)
Sent to the pharmacy by e-scribe. 

## 2017-08-30 ENCOUNTER — Other Ambulatory Visit: Payer: Medicare Other

## 2017-09-01 ENCOUNTER — Encounter: Payer: Self-pay | Admitting: Adult Health

## 2017-09-01 ENCOUNTER — Ambulatory Visit (INDEPENDENT_AMBULATORY_CARE_PROVIDER_SITE_OTHER): Payer: Medicare Other | Admitting: Adult Health

## 2017-09-01 VITALS — BP 160/60 | Temp 98.5°F | Wt 173.0 lb

## 2017-09-01 DIAGNOSIS — N289 Disorder of kidney and ureter, unspecified: Secondary | ICD-10-CM

## 2017-09-01 DIAGNOSIS — G4700 Insomnia, unspecified: Secondary | ICD-10-CM

## 2017-09-01 LAB — BASIC METABOLIC PANEL
BUN: 15 mg/dL (ref 6–23)
CALCIUM: 9 mg/dL (ref 8.4–10.5)
CO2: 22 mEq/L (ref 19–32)
Chloride: 104 mEq/L (ref 96–112)
Creatinine, Ser: 1.34 mg/dL — ABNORMAL HIGH (ref 0.40–1.20)
GFR: 39.83 mL/min — AB (ref >60.00)
GLUCOSE: 114 mg/dL — AB (ref 70–99)
Potassium: 4 mEq/L (ref 3.5–5.1)
SODIUM: 139 meq/L (ref 135–145)

## 2017-09-01 NOTE — Progress Notes (Signed)
Subjective:    Patient ID: [name], female    DOB: 1929/07/11, 84 y.o.   MRN: [id number]  HPI  84 year old female who  has a past medical history of Anemia, Chronic fatigue, Chronic renal insufficiency, stage III (moderate) ([place]), Depression, HYPERTENSION (04/04/2007), HYPOTHYROIDISM (04/04/2007), and Macular degeneration.  She presents to the office today for one-month follow-up after increasing trazodone from 25 mg to 50 mg for insomnia.  She reports that although she is sleeping slightly better on the 50 mg tablet she feels as though "my memory is a lot worse since they increase this medication."  She reports issues with confusion and episodes of word finding issues.  She denies any falls.   She was also seen by [facility] on August 19, 2017 at which time a BMP was drawn and showed a decreased kidney function of 33 and a creatinine of 1.48.  This is lower than her baseline of a GFR of 48-49 and a normal creatinine clearance.  She has not been taking any NSAIDs or supplements over-the-counter.  Review of Systems See HPI   Past Medical History:  Diagnosis Date  . Anemia   . Chronic fatigue   . Chronic renal insufficiency, stage III (moderate) (HCC)   . Depression   . HYPERTENSION 04/04/2007  . HYPOTHYROIDISM 04/04/2007  . Macular degeneration     Social History   Socioeconomic History  . Marital status: Widowed    Spouse name: Not on file  . Number of children: Not on file  . Years of education: Not on file  . Highest education level: Not on file  Social Needs  . Financial resource strain: Not on file  . Food insecurity - worry: Not on file  . Food insecurity - inability: Not on file  . Transportation needs - medical: Not on file  . Transportation needs - non-medical: Not on file  Occupational History  . Occupation: Retired    Fish farm manager: RETIRED  Tobacco Use  . Smoking status: Former Smoker    Packs/day: 1.00    Years: 7.00    Pack years: 7.00    Types: Cigarettes    Last attempt to quit: 10/13/1969    Years since quitting: 47.9  . Smokeless tobacco: Never Used  Substance and Sexual Activity  . Alcohol use: No  . Drug use: No  . Sexual activity: Not on file  Other Topics Concern  . Not on file  Social History Narrative  . Not on file    Past Surgical History:  Procedure Laterality Date  . ABDOMINAL HYSTERECTOMY    . APPENDECTOMY    . BACK SURGERY  2012  . Cataracts    . CHOLECYSTECTOMY    . ERCP N/A 11/22/2014   Procedure: ENDOSCOPIC RETROGRADE CHOLANGIOPANCREATOGRAPHY (ERCP);  Surgeon: Clarene Essex, MD;  Location: [facility] ENDOSCOPY;  Service: Endoscopy;  Laterality: N/A;  . HERNIA REPAIR    . OOPHORECTOMY    . ORIF TIBIA FRACTURE  2012   dr duda  . SPINE SURGERY  2011   cervical    Family History  Problem Relation Age of Onset  . Kidney cancer Mother 81       renal cell CA  . Heart disease Father   . Lymphoma Son   . Breast cancer Daughter     Allergies  Allergen Reactions  . Oxycodone Hcl     REACTION: itching    Current Outpatient Medications on File Prior to Visit  Medication Sig Dispense  Refill  . amLODipine (NORVASC) 10 MG tablet TAKE 1 TABLET DAILY 90 tablet 3  . cetirizine (ZYRTEC) 10 MG tablet Take 10 mg by mouth daily.    . clidinium-chlordiazePOXIDE (LIBRAX) 2.5-5 MG per capsule 2 (two) times daily as needed.     Marland Kitchen levothyroxine (SYNTHROID, LEVOTHROID) 137 MCG tablet Take 1 tablet (137 mcg total) by mouth daily before breakfast. 90 tablet 1  . Multiple Vitamin (MULTIVITAMIN) tablet Take 1 tablet by mouth daily.    Marland Kitchen olmesartan (BENICAR) 20 MG tablet TAKE 1 TABLET DAILY 90 tablet 2  . omeprazole (PRILOSEC) 40 MG capsule Take 1 tablet by mouth daily before breakfast.     . traZODone (DESYREL) 50 MG tablet Take 1 tablet (50 mg total) by mouth at bedtime. 30 tablet 1   No current facility-administered medications on file prior to visit.     BP (!) 160/60 (BP Location: Left Arm)   Temp 98.5 F (36.9 C) (Oral)   Wt 173 lb  (78.5 kg)   BMI 31.64 kg/m       Objective:   Physical Exam  Constitutional: She is oriented to person, place, and time. She appears well-developed and well-nourished. No distress.  Cardiovascular: Normal rate, regular rhythm, normal heart sounds and intact distal pulses. Exam reveals no gallop and no friction rub.  No murmur heard. Pulmonary/Chest: Effort normal and breath sounds normal. No respiratory distress. She has no wheezes. She has no rales. She exhibits no tenderness.  Neurological: She is alert and oriented to person, place, and time.  Skin: Skin is warm and dry. No rash noted. She is not diaphoretic. No erythema. No pallor.  Psychiatric: She has a normal mood and affect. Her behavior is normal. Judgment and thought content normal.  Nursing note and vitals reviewed.      Assessment & Plan:  1. Insomnia, unspecified type Due to cognitive impairment while taking trazodone 50 mg to have her cut back to 25 mg and see if her memory responds.  If not she is directed to come off this medication and follow-up with me  2. Function kidney decreased  - Basic Metabolic Panel   Dorothyann Peng, NP

## 2017-09-08 ENCOUNTER — Ambulatory Visit
Admission: RE | Admit: 2017-09-08 | Discharge: 2017-09-08 | Disposition: A | Discharge status: Home / Self Care | Payer: Medicare Other | Source: Ambulatory Visit | Attending: Gastroenterology | Admitting: Gastroenterology

## 2017-09-08 DIAGNOSIS — R1030 Lower abdominal pain, unspecified: Secondary | ICD-10-CM

## 2017-09-08 DIAGNOSIS — K5730 Diverticulosis of large intestine without perforation or abscess without bleeding: Secondary | ICD-10-CM | POA: Diagnosis not present

## 2017-09-08 MED ORDER — GADOBENATE DIMEGLUMINE 529 MG/ML IV SOLN
8.0000 mL | Freq: Once | INTRAVENOUS | Status: AC | PRN
  Administered 2017-09-08: 8 mL via INTRAVENOUS

## 2017-09-13 NOTE — Telephone Encounter (Signed)
Caller name: Dyamond Tolosa Relationship to patient: self Can be reached: [phone] Pharmacy:  [place], [place] [phone] (Phone) [phone] (Fax)   Reason for call: Pt called stating [facility] keeps calling her regarding need for clarification on RX for Synthroid/levothyroxine. Pt was told they don't know if they need to dispense brand name or generic. Pt believes she's always been on levothyroxine generic. Please call Customer Service at either [phone] or [phone] regarding order # [id number]. Please advise

## 2017-09-13 NOTE — Telephone Encounter (Signed)
Called and spoke to [place] the pharmacist at [facility] and informed him the pt is taking generic.  Per Mateo Flow, order will be ready to ship.

## 2017-10-14 ENCOUNTER — Telehealth: Payer: Self-pay | Admitting: Adult Health

## 2017-10-14 NOTE — Telephone Encounter (Signed)
Copied from [place] [id number]. Topic: Quick Communication - See Telephone Encounter >> Oct 14, 2017 10:46 AM Ahmed Prima L wrote: CRM for notification. See Telephone encounter for:   10/14/17.  Patient said that the "zyrtec" she is taking says on the bottle due not use if you have had liver or kidney disease. She wants to know can she have something else in place of this?  [facility] [address] [place] [address]

## 2017-10-14 NOTE — Telephone Encounter (Signed)
Yes it is okay for her to take Zyrtec

## 2017-10-14 NOTE — Telephone Encounter (Signed)
Dr. Sarajane Jews, is it ok if pt continues on Zyrtec?

## 2017-10-15 NOTE — Telephone Encounter (Signed)
Pt notified to continue taking OTC Zyrtec.

## 2017-12-16 ENCOUNTER — Telehealth: Payer: Self-pay | Admitting: Family Medicine

## 2017-12-16 ENCOUNTER — Telehealth: Payer: Self-pay | Admitting: Adult Health

## 2017-12-16 DIAGNOSIS — I10 Essential (primary) hypertension: Secondary | ICD-10-CM

## 2017-12-16 NOTE — Telephone Encounter (Signed)
Copied from [place] [phone]. Topic: Quick Communication - See Telephone Encounter >> Dec 16, 2017 12:05 PM Ether Griffins B wrote: CRM for notification. See Telephone encounter for: 12/16/17.  Pt calling in regards of her olmesartan (BENICAR) 20 MG tablet. She has 17 pills left put [facility] is out of the medication for a month. She is requesting [name] call [facility] and see what else she can be on until they get this medication back in stock. [facility] [phone]. She states she would rather not change medication if she can help it. Not sure if a local pharmacy would have it in stock for her.

## 2017-12-16 NOTE — Telephone Encounter (Signed)
Copied from [place] [phone]. Topic: General - Other >> Dec 16, 2017  4:53 PM Yvette Rack wrote: Reason for CRM: Pt called back requesting a return call. Cb# [phone]

## 2017-12-16 NOTE — Telephone Encounter (Signed)
Left a message for a return call.

## 2017-12-17 MED ORDER — OLMESARTAN MEDOXOMIL 20 MG PO TABS: 20.0000 mg | ORAL_TABLET | Freq: Every day | ORAL | 0 refills | Status: DC

## 2017-12-17 NOTE — Telephone Encounter (Signed)
Nimmons, Emilio Math, RN 14 hours ago (4:58 PM)      Copied from [place] [phone]. Topic: General - Other >> Dec 16, 2017  4:53 PM Yvette Rack wrote: Reason for CRM: Pt called back requesting a return call. Cb# [phone]

## 2017-12-17 NOTE — Telephone Encounter (Signed)
Duplicate message. 

## 2017-12-17 NOTE — Telephone Encounter (Signed)
Pt notified me that [facility] does not have Benicar in stock.  I have sent in a refill to [facility].  Advised pt, should that pharmacy not have it in stock than she will need to call and find a pharmacy that does.  We can then send in a prescription.  If she is unable to find a pharmacy that has it in stock then she will need to call back and the office will see about getting a provider to change the prescription.  Pt agreed.  No further action required.

## 2018-01-20 ENCOUNTER — Ambulatory Visit: Payer: Medicare Other | Admitting: Adult Health

## 2018-01-21 ENCOUNTER — Encounter: Payer: Self-pay | Admitting: Adult Health

## 2018-01-21 ENCOUNTER — Ambulatory Visit (INDEPENDENT_AMBULATORY_CARE_PROVIDER_SITE_OTHER): Payer: Medicare Other | Admitting: Adult Health

## 2018-01-21 VITALS — BP 160/60 | Temp 98.6°F | Ht 61.0 in | Wt 176.0 lb

## 2018-01-21 DIAGNOSIS — N289 Disorder of kidney and ureter, unspecified: Secondary | ICD-10-CM

## 2018-01-21 DIAGNOSIS — E039 Hypothyroidism, unspecified: Secondary | ICD-10-CM

## 2018-01-21 DIAGNOSIS — I10 Essential (primary) hypertension: Secondary | ICD-10-CM

## 2018-01-21 LAB — HEPATIC FUNCTION PANEL
ALT: 14 U/L (ref 0–35)
AST: 13 U/L (ref 0–37)
Albumin: 4.3 g/dL (ref 3.5–5.2)
Alkaline Phosphatase: 66 U/L (ref 39–117)
BILIRUBIN DIRECT: 0.1 mg/dL (ref 0.0–0.3)
TOTAL PROTEIN: 6.9 g/dL (ref 6.0–8.3)
Total Bilirubin: 0.5 mg/dL (ref 0.2–1.2)

## 2018-01-21 LAB — BASIC METABOLIC PANEL
BUN: 16 mg/dL (ref 6–23)
CHLORIDE: 108 meq/L (ref 96–112)
CO2: 27 mEq/L (ref 19–32)
CREATININE: 1.25 mg/dL — AB (ref 0.40–1.20)
Calcium: 9.3 mg/dL (ref 8.4–10.5)
GFR: 43.12 mL/min — ABNORMAL LOW (ref >60.00)
Glucose, Bld: 105 mg/dL — ABNORMAL HIGH (ref 70–99)
Potassium: 4.4 mEq/L (ref 3.5–5.1)
Sodium: 144 mEq/L (ref 135–145)

## 2018-01-21 LAB — LIPID PANEL
CHOL/HDL RATIO: 4
Cholesterol: 156 mg/dL (ref 0–200)
HDL: 44.5 mg/dL (ref >39.00)
LDL Cholesterol: 99 mg/dL (ref 0–99)
NonHDL: 111.46
TRIGLYCERIDES: 62 mg/dL (ref 0.0–149.0)
VLDL: 12.4 mg/dL (ref 0.0–40.0)

## 2018-01-21 LAB — CBC WITH DIFFERENTIAL/PLATELET
BASOS PCT: 1.3 % (ref 0.0–3.0)
Basophils Absolute: 0.1 10*3/uL (ref 0.0–0.1)
EOS ABS: 0.3 10*3/uL (ref 0.0–0.7)
Eosinophils Relative: 4.4 % (ref 0.0–5.0)
HEMATOCRIT: 33.3 % — AB (ref 36.0–46.0)
Hemoglobin: 11.3 g/dL — ABNORMAL LOW (ref 12.0–15.0)
Lymphocytes Relative: 20.9 % (ref 12.0–46.0)
Lymphs Abs: 1.5 10*3/uL (ref 0.7–4.0)
MCHC: 33.8 g/dL (ref 30.0–36.0)
MCV: 92.4 fl (ref 78.0–100.0)
Monocytes Absolute: 0.7 10*3/uL (ref 0.1–1.0)
Monocytes Relative: 10.6 % (ref 3.0–12.0)
NEUTROS ABS: 4.4 10*3/uL (ref 1.4–7.7)
Neutrophils Relative %: 62.8 % (ref 43.0–77.0)
PLATELETS: 308 10*3/uL (ref 150.0–400.0)
RBC: 3.61 Mil/uL — ABNORMAL LOW (ref 3.87–5.11)
RDW: 13.2 % (ref 11.5–15.5)
WBC: 7 10*3/uL (ref 4.0–10.5)

## 2018-01-21 LAB — POCT URINALYSIS DIPSTICK
Bilirubin, UA: NEGATIVE
Blood, UA: NEGATIVE
Glucose, UA: NEGATIVE
Ketones, UA: NEGATIVE
NITRITE UA: NEGATIVE
Odor: NEGATIVE
PH UA: 6 (ref 5.0–8.0)
PROTEIN UA: POSITIVE — AB
SPEC GRAV UA: 1.025 (ref 1.010–1.025)
UROBILINOGEN UA: 0.2 U/dL

## 2018-01-21 LAB — T3, FREE: T3, Free: 3.4 pg/mL (ref 2.3–4.2)

## 2018-01-21 LAB — T4, FREE: FREE T4: 0.89 ng/dL (ref 0.60–1.60)

## 2018-01-21 LAB — TSH: TSH: 2.64 u[IU]/mL (ref 0.35–4.50)

## 2018-01-21 MED ORDER — AMLODIPINE BESYLATE 10 MG PO TABS: 10.0000 mg | ORAL_TABLET | Freq: Every day | ORAL | 3 refills | Status: DC

## 2018-01-21 NOTE — Patient Instructions (Addendum)
It was great seeing you today   I will follow up with you regarding your blood work   Please let me if you need anything

## 2018-01-21 NOTE — Progress Notes (Signed)
Subjective:    Patient ID: [name], female    DOB: July 11, 1929, 84 y.o.   MRN: [id number]  HPI Patient presents for yearly preventative medicine examination. She is a pleasant 84 year old female who  has a past medical history of Anemia, Chronic fatigue, Chronic renal insufficiency, stage III (moderate) ([place]), Depression, HYPERTENSION (04/04/2007), HYPOTHYROIDISM (04/04/2007), and Macular degeneration.  Hypothyroidism -Is prescribed synthroid 137 mcg. Her previous TSH was elevated and she never returned to have it rechecked   Hypertension -controlled on Norvasc and Benicar. She did not take her medications this morning  BP Readings from Last 3 Encounters:  01/21/18 (!) 160/60  09/01/17 (!) 160/60  07/29/17 140/82   Insomnia - takes trazodone 25 mg nightly; feels as though she is sleeping well. She has stopped watching TV in bed and has noticed she is sleeping well.   Chronic Kidney Disease - kidney function has been stable over the last few years. She reports darker urine over the last few months. She does report staying hydrated.   Lab Results  Component Value Date   CREATININE 1.34 (H) 09/01/2017   BUN 15 09/01/2017   NA 139 09/01/2017   K 4.0 09/01/2017   CL 104 09/01/2017   CO2 22 09/01/2017   All immunizations and health maintenance protocols were reviewed with the patient and needed orders were placed.  She is UTD on vaccinations   Appropriate screening laboratory values were ordered for the patient including screening of hyperlipidemia, renal function and hepatic function.  Medication reconciliation,  past medical history, social history, problem list and allergies were reviewed in detail with the patient  Goals were established with regard to weight loss, exercise, and  diet in compliance with medications Wt Readings from Last 3 Encounters:  01/21/18 176 lb (79.8 kg)  09/01/17 173 lb (78.5 kg)  07/29/17 176 lb 6.4 oz (80 kg)   End of life planning was  discussed.  She is no longer in need of a colonoscopy, mammogram, or pap. She does not do routine dental or vision exams    Review of Systems  Constitutional: Negative.   HENT: Negative.   Eyes: Negative.   Respiratory: Negative.   Cardiovascular: Negative.   Gastrointestinal: Negative.   Genitourinary: Negative.        Dark urine    Musculoskeletal: Positive for arthralgias, back pain and joint swelling.  Skin: Negative.   Allergic/Immunologic: Negative.   Neurological: Negative.   Hematological: Negative.   Psychiatric/Behavioral: Negative.   All other systems reviewed and are negative.  Past Medical History:  Diagnosis Date  . Anemia   . Chronic fatigue   . Chronic renal insufficiency, stage III (moderate) (HCC)   . Depression   . HYPERTENSION 04/04/2007  . HYPOTHYROIDISM 04/04/2007  . Macular degeneration     Social History   Socioeconomic History  . Marital status: Widowed    Spouse name: Not on file  . Number of children: Not on file  . Years of education: Not on file  . Highest education level: Not on file  Occupational History  . Occupation: Retired    Fish farm manager: RETIRED  Social Needs  . Financial resource strain: Not on file  . Food insecurity:    Worry: Not on file    Inability: Not on file  . Transportation needs:    Medical: Not on file    Non-medical: Not on file  Tobacco Use  . Smoking status: Former Smoker    Packs/day: 1.00  Years: 7.00    Pack years: 7.00    Types: Cigarettes    Last attempt to quit: 10/13/1969    Years since quitting: 48.3  . Smokeless tobacco: Never Used  Substance and Sexual Activity  . Alcohol use: No  . Drug use: No  . Sexual activity: Not on file  Lifestyle  . Physical activity:    Days per week: Not on file    Minutes per session: Not on file  . Stress: Not on file  Relationships  . Social connections:    Talks on phone: Not on file    Gets together: Not on file    Attends religious service: Not on file     Active member of club or organization: Not on file    Attends meetings of clubs or organizations: Not on file    Relationship status: Not on file  . Intimate partner violence:    Fear of current or ex partner: Not on file    Emotionally abused: Not on file    Physically abused: Not on file    Forced sexual activity: Not on file  Other Topics Concern  . Not on file  Social History Narrative  . Not on file    Past Surgical History:  Procedure Laterality Date  . ABDOMINAL HYSTERECTOMY    . APPENDECTOMY    . BACK SURGERY  2012  . Cataracts    . CHOLECYSTECTOMY    . ERCP N/A 11/22/2014   Procedure: ENDOSCOPIC RETROGRADE CHOLANGIOPANCREATOGRAPHY (ERCP);  Surgeon: Clarene Essex, MD;  Location: [facility] ENDOSCOPY;  Service: Endoscopy;  Laterality: N/A;  . HERNIA REPAIR    . OOPHORECTOMY    . ORIF TIBIA FRACTURE  2012   dr duda  . SPINE SURGERY  2011   cervical    Family History  Problem Relation Age of Onset  . Kidney cancer Mother 53       renal cell CA  . Heart disease Father   . Lymphoma Son   . Breast cancer Daughter     Allergies  Allergen Reactions  . Oxycodone Hcl     REACTION: itching    Current Outpatient Medications on File Prior to Visit  Medication Sig Dispense Refill  . cetirizine (ZYRTEC) 10 MG tablet Take 10 mg by mouth daily.    . clidinium-chlordiazePOXIDE (LIBRAX) 2.5-5 MG per capsule 2 (two) times daily as needed.     Marland Kitchen levothyroxine (SYNTHROID, LEVOTHROID) 137 MCG tablet Take 1 tablet (137 mcg total) by mouth daily before breakfast. 90 tablet 1  . Multiple Vitamin (MULTIVITAMIN) tablet Take 1 tablet by mouth daily.    Marland Kitchen olmesartan (BENICAR) 20 MG tablet Take 1 tablet (20 mg total) by mouth daily. 90 tablet 0  . omeprazole (PRILOSEC) 40 MG capsule Take 1 tablet by mouth daily.     . traZODone (DESYREL) 50 MG tablet Take 1 tablet (50 mg total) by mouth at bedtime. (Patient taking differently: Take 25 mg by mouth at bedtime. ) 30 tablet 1   No current  facility-administered medications on file prior to visit.     BP (!) 160/60 Comment: No Meds  Temp 98.6 F (37 C) (Oral)   Ht 5\' 1"  (1.549 m)   Wt 176 lb (79.8 kg)   BMI 33.25 kg/m       Objective:   Physical Exam  Constitutional: She is oriented to person, place, and time. She appears well-developed and well-nourished. No distress.  Eyes: Pupils are equal, round, and reactive  to light. Conjunctivae and EOM are normal. Right eye exhibits no discharge. Left eye exhibits no discharge. No scleral icterus.  Neck: Normal range of motion. Neck supple. No JVD present. No tracheal deviation present. No thyromegaly present.  Cardiovascular: Normal rate, regular rhythm and intact distal pulses. Exam reveals no gallop and no friction rub.  Murmur (heard best at [facility] ) heard.  Systolic murmur is present with a grade of 2/6. Pulmonary/Chest: Effort normal and breath sounds normal. No stridor. No respiratory distress. She has no wheezes. She has no rales. She exhibits no tenderness.  Abdominal: Soft. Bowel sounds are normal. She exhibits no distension and no mass. There is no tenderness. There is no rebound and no guarding. No hernia.  Lymphadenopathy:    She has no cervical adenopathy.  Neurological: She is alert and oriented to person, place, and time.  Skin: Capillary refill takes less than 2 seconds. She is not diaphoretic.  Psychiatric: She has a normal mood and affect. Her behavior is normal. Judgment and thought content normal.  Nursing note and vitals reviewed.     Assessment & Plan:  1. Essential hypertension - Normally well controlled when she takes BP meds. She did not take them today due to fasting.  - Will continue to monitor  - Basic metabolic panel - CBC with Differential/Platelet - Hepatic function panel - Lipid panel - TSH - amLODipine (NORVASC) 10 MG tablet; Take 1 tablet (10 mg total) by mouth daily.  Dispense: 90 tablet; Refill: 3 - POC Urinalysis Dipstick  2.  Hypothyroidism, unspecified type - Consider dose change  - Basic metabolic panel - CBC with Differential/Platelet - Hepatic function panel - Lipid panel - TSH - T3, Free - T4, Free  3. Renal insufficiency - Consider referral to nephrology  - She is not taking any nephrotoxic medications such as Nsaids  - Basic metabolic panel - POC Urinalysis Dipstick  Dorothyann Peng, NP

## 2018-01-24 ENCOUNTER — Other Ambulatory Visit: Payer: Self-pay | Admitting: Adult Health

## 2018-01-24 DIAGNOSIS — E039 Hypothyroidism, unspecified: Secondary | ICD-10-CM

## 2018-01-25 NOTE — Telephone Encounter (Signed)
Sent to the pharmacy by e-scribe. 

## 2018-01-28 ENCOUNTER — Other Ambulatory Visit: Payer: Medicare Other

## 2018-01-28 ENCOUNTER — Encounter: Payer: Self-pay | Admitting: Adult Health

## 2018-01-28 ENCOUNTER — Encounter: Payer: Self-pay | Admitting: Family Medicine

## 2018-01-28 ENCOUNTER — Ambulatory Visit (INDEPENDENT_AMBULATORY_CARE_PROVIDER_SITE_OTHER): Payer: Medicare Other | Admitting: Adult Health

## 2018-01-28 VITALS — BP 160/50 | Temp 97.9°F | Wt 174.0 lb

## 2018-01-28 DIAGNOSIS — J302 Other seasonal allergic rhinitis: Secondary | ICD-10-CM

## 2018-01-28 DIAGNOSIS — F339 Major depressive disorder, recurrent, unspecified: Secondary | ICD-10-CM

## 2018-01-28 DIAGNOSIS — R809 Proteinuria, unspecified: Secondary | ICD-10-CM | POA: Diagnosis not present

## 2018-01-28 DIAGNOSIS — F5101 Primary insomnia: Secondary | ICD-10-CM

## 2018-01-28 LAB — POCT URINALYSIS DIPSTICK
Bilirubin, UA: NEGATIVE
Blood, UA: NEGATIVE
Glucose, UA: NEGATIVE
KETONES UA: NEGATIVE
Leukocytes, UA: NEGATIVE
Nitrite, UA: NEGATIVE
PH UA: 6 (ref 5.0–8.0)
Protein, UA: NEGATIVE
Spec Grav, UA: 1.01 (ref 1.010–1.025)
UROBILINOGEN UA: 0.2 U/dL

## 2018-01-28 MED ORDER — TRAZODONE HCL 100 MG PO TABS: 100.0000 mg | ORAL_TABLET | Freq: Every day | ORAL | 1 refills | Status: DC

## 2018-01-28 MED ORDER — MONTELUKAST SODIUM 10 MG PO TABS: 10.0000 mg | ORAL_TABLET | Freq: Every day | ORAL | 0 refills | Status: DC

## 2018-01-28 NOTE — Progress Notes (Signed)
Subjective:    Patient ID: [name], female    DOB: [birth year], 84 y.o.   MRN: [id number]  HPI 84 year old female who  has a past medical history of Anemia, Chronic fatigue, Chronic renal insufficiency, stage III (moderate) ([place]), Depression, HYPERTENSION (04/04/2007), HYPOTHYROIDISM (04/04/2007), and Macular degeneration.  She presents to the office today with many acute complaints.   1. Chronic cough - this has been an issue for many years at this point in time but feels as though it is getting worse over the last year. Cough is more dry in nature. She denies any shortness of breath but does endorse wheezing at time. Also endorses runny nose and PND. She has tried zytrec and claritin without relief. She is unable to use Flonase due to irritation in her nose.   She is taking her Prilosec 40 mg daily   2. Insomnia - another long standing issue for the patient. She is taking Trazodone 50 mg but reports that she is not sleeping well. She reports that she may sleep 3-4 hours a night, and she often tosses and turns. She some of her friends Xanax and slept well with this. She is wondering if I would prescribe this for her   3. Proteinuria - She tested positive for protein in her urine during her annual exam one week ago. I want to retest this today   Review of Systems See HPI   Past Medical History:  Diagnosis Date  . Anemia   . Chronic fatigue   . Chronic renal insufficiency, stage III (moderate) (HCC)   . Depression   . HYPERTENSION 04/04/2007  . HYPOTHYROIDISM 04/04/2007  . Macular degeneration     Social History   Socioeconomic History  . Marital status: Widowed    Spouse name: Not on file  . Number of children: Not on file  . Years of education: Not on file  . Highest education level: Not on file  Occupational History  . Occupation: Retired    Fish farm manager: RETIRED  Social Needs  . Financial resource strain: Not on file  . Food insecurity:    Worry: Not on file   Inability: Not on file  . Transportation needs:    Medical: Not on file    Non-medical: Not on file  Tobacco Use  . Smoking status: Former Smoker    Packs/day: 1.00    Years: 7.00    Pack years: 7.00    Types: Cigarettes    Last attempt to quit: 10/13/1969    Years since quitting: 48.3  . Smokeless tobacco: Never Used  Substance and Sexual Activity  . Alcohol use: No  . Drug use: No  . Sexual activity: Not on file  Lifestyle  . Physical activity:    Days per week: Not on file    Minutes per session: Not on file  . Stress: Not on file  Relationships  . Social connections:    Talks on phone: Not on file    Gets together: Not on file    Attends religious service: Not on file    Active member of club or organization: Not on file    Attends meetings of clubs or organizations: Not on file    Relationship status: Not on file  . Intimate partner violence:    Fear of current or ex partner: Not on file    Emotionally abused: Not on file    Physically abused: Not on file    Forced sexual activity:  Not on file  Other Topics Concern  . Not on file  Social History Narrative  . Not on file    Past Surgical History:  Procedure Laterality Date  . ABDOMINAL HYSTERECTOMY    . APPENDECTOMY    . BACK SURGERY  2012  . Cataracts    . CHOLECYSTECTOMY    . ERCP N/A 11/22/2014   Procedure: ENDOSCOPIC RETROGRADE CHOLANGIOPANCREATOGRAPHY (ERCP);  Surgeon: Clarene Essex, MD;  Location: [facility] ENDOSCOPY;  Service: Endoscopy;  Laterality: N/A;  . HERNIA REPAIR    . OOPHORECTOMY    . ORIF TIBIA FRACTURE  2012   dr duda  . SPINE SURGERY  2011   cervical    Family History  Problem Relation Age of Onset  . Kidney cancer Mother 100       renal cell CA  . Heart disease Father   . Lymphoma Son   . Breast cancer Daughter     Allergies  Allergen Reactions  . Oxycodone Hcl     REACTION: itching    Current Outpatient Medications on File Prior to Visit  Medication Sig Dispense Refill  .  amLODipine (NORVASC) 10 MG tablet Take 1 tablet (10 mg total) by mouth daily. 90 tablet 3  . cetirizine (ZYRTEC) 10 MG tablet Take 10 mg by mouth daily.    . clidinium-chlordiazePOXIDE (LIBRAX) 2.5-5 MG per capsule 2 (two) times daily as needed.     Marland Kitchen levothyroxine (SYNTHROID, LEVOTHROID) 137 MCG tablet TAKE 1 TABLET BY MOUTH  DAILY BEFORE BREAKFAST 90 tablet 3  . Multiple Vitamin (MULTIVITAMIN) tablet Take 1 tablet by mouth daily.    Marland Kitchen olmesartan (BENICAR) 20 MG tablet Take 1 tablet (20 mg total) by mouth daily. 90 tablet 0  . omeprazole (PRILOSEC) 40 MG capsule Take 1 tablet by mouth daily.      No current facility-administered medications on file prior to visit.     BP (!) 160/50   Temp 97.9 F (36.6 C) (Oral)   Wt 174 lb (78.9 kg)   BMI 32.88 kg/m       Objective:   Physical Exam  Constitutional: She is oriented to person, place, and time. She appears well-developed and well-nourished. No distress.  HENT:  Head: Normocephalic and atraumatic.  Right Ear: Hearing, tympanic membrane, external ear and ear canal normal.  Left Ear: Hearing, tympanic membrane, external ear and ear canal normal.  Nose: No mucosal edema or rhinorrhea. Right sinus exhibits no maxillary sinus tenderness and no frontal sinus tenderness. Left sinus exhibits no maxillary sinus tenderness and no frontal sinus tenderness.  Mouth/Throat: Uvula is midline, oropharynx is clear and moist and mucous membranes are normal.  Cardiovascular: Normal rate, regular rhythm, normal heart sounds and intact distal pulses. Exam reveals no friction rub.  No murmur heard. Pulmonary/Chest: Effort normal and breath sounds normal. No stridor. No respiratory distress. She has no wheezes. She has no rales. She exhibits no tenderness.  Neurological: She is alert and oriented to person, place, and time. She displays normal reflexes. No cranial nerve deficit or sensory deficit. She exhibits normal muscle tone. Coordination normal.  Skin:  She is not diaphoretic.  Nursing note and vitals reviewed.     Assessment & Plan:  1. Seasonal allergies - I think her cough is from seasonal allergies. Will trial her on Singulair  - montelukast (SINGULAIR) 10 MG tablet; Take 1 tablet (10 mg total) by mouth at bedtime.  Dispense: 90 tablet; Refill: 0  2. Primary insomnia - Will increase  to 100 mg. I do not feel comfortable prescribing her xanax  - traZODone (DESYREL) 100 MG tablet; Take 1 tablet (100 mg total) by mouth at bedtime.  Dispense: 90 tablet; Refill: 1  3. Proteinuria, unspecified type  - POC Urinalysis Dipstick  Dorothyann Peng, NP

## 2018-02-18 ENCOUNTER — Ambulatory Visit (INDEPENDENT_AMBULATORY_CARE_PROVIDER_SITE_OTHER): Payer: Medicare Other | Admitting: Adult Health

## 2018-02-18 ENCOUNTER — Encounter: Payer: Self-pay | Admitting: Adult Health

## 2018-02-18 VITALS — BP 150/52 | Temp 98.1°F | Wt 178.0 lb

## 2018-02-18 DIAGNOSIS — R05 Cough: Secondary | ICD-10-CM

## 2018-02-18 DIAGNOSIS — G4709 Other insomnia: Secondary | ICD-10-CM

## 2018-02-18 DIAGNOSIS — R059 Cough, unspecified: Secondary | ICD-10-CM

## 2018-02-18 NOTE — Progress Notes (Signed)
Subjective:    Patient ID: [name], female    DOB: 15-Apr-1929, 84 y.o.   MRN: [id number]  HPI  84 year old female who  has a past medical history of Anemia, Chronic fatigue, Chronic renal insufficiency, stage III (moderate) ([place]), Depression, HYPERTENSION (04/04/2007), HYPOTHYROIDISM (04/04/2007), and Macular degeneration.  She presents to the office today for follow up regarding insomnia. She reports that since increasing Trazodone from 50 mg to 100 mg she has been sleeping much better. She continues to get up around 4 or 5 am - which has been doing this for many years. She feels as though she is getting a deeper more restful sleep.   She also reports that since starting Singular that her cough and PND has greatly improved as well.   Review of Systems See HPI   Past Medical History:  Diagnosis Date  . Anemia   . Chronic fatigue   . Chronic renal insufficiency, stage III (moderate) (HCC)   . Depression   . HYPERTENSION 04/04/2007  . HYPOTHYROIDISM 04/04/2007  . Macular degeneration     Social History   Socioeconomic History  . Marital status: Widowed    Spouse name: Not on file  . Number of children: Not on file  . Years of education: Not on file  . Highest education level: Not on file  Occupational History  . Occupation: Retired    Fish farm manager: RETIRED  Social Needs  . Financial resource strain: Not on file  . Food insecurity:    Worry: Not on file    Inability: Not on file  . Transportation needs:    Medical: Not on file    Non-medical: Not on file  Tobacco Use  . Smoking status: Former Smoker    Packs/day: 1.00    Years: 7.00    Pack years: 7.00    Types: Cigarettes    Last attempt to quit: 10/13/1969    Years since quitting: 48.3  . Smokeless tobacco: Never Used  Substance and Sexual Activity  . Alcohol use: No  . Drug use: No  . Sexual activity: Not on file  Lifestyle  . Physical activity:    Days per week: Not on file    Minutes per session: Not on  file  . Stress: Not on file  Relationships  . Social connections:    Talks on phone: Not on file    Gets together: Not on file    Attends religious service: Not on file    Active member of club or organization: Not on file    Attends meetings of clubs or organizations: Not on file    Relationship status: Not on file  . Intimate partner violence:    Fear of current or ex partner: Not on file    Emotionally abused: Not on file    Physically abused: Not on file    Forced sexual activity: Not on file  Other Topics Concern  . Not on file  Social History Narrative  . Not on file    Past Surgical History:  Procedure Laterality Date  . ABDOMINAL HYSTERECTOMY    . APPENDECTOMY    . BACK SURGERY  2012  . Cataracts    . CHOLECYSTECTOMY    . ERCP N/A 11/22/2014   Procedure: ENDOSCOPIC RETROGRADE CHOLANGIOPANCREATOGRAPHY (ERCP);  Surgeon: Clarene Essex, MD;  Location: [facility] ENDOSCOPY;  Service: Endoscopy;  Laterality: N/A;  . HERNIA REPAIR    . OOPHORECTOMY    . ORIF TIBIA FRACTURE  2012   dr duda  . SPINE SURGERY  2011   cervical    Family History  Problem Relation Age of Onset  . Kidney cancer Mother 6       renal cell CA  . Heart disease Father   . Lymphoma Son   . Breast cancer Daughter     Allergies  Allergen Reactions  . Oxycodone Hcl     REACTION: itching    Current Outpatient Medications on File Prior to Visit  Medication Sig Dispense Refill  . amLODipine (NORVASC) 10 MG tablet Take 1 tablet (10 mg total) by mouth daily. 90 tablet 3  . cetirizine (ZYRTEC) 10 MG tablet Take 10 mg by mouth daily.    . clidinium-chlordiazePOXIDE (LIBRAX) 2.5-5 MG per capsule 2 (two) times daily as needed.     Marland Kitchen levothyroxine (SYNTHROID, LEVOTHROID) 137 MCG tablet TAKE 1 TABLET BY MOUTH  DAILY BEFORE BREAKFAST 90 tablet 3  . montelukast (SINGULAIR) 10 MG tablet Take 1 tablet (10 mg total) by mouth at bedtime. 90 tablet 0  . Multiple Vitamin (MULTIVITAMIN) tablet Take 1 tablet by mouth  daily.    Marland Kitchen olmesartan (BENICAR) 20 MG tablet Take 1 tablet (20 mg total) by mouth daily. 90 tablet 0  . omeprazole (PRILOSEC) 40 MG capsule Take 1 tablet by mouth daily.     . traZODone (DESYREL) 100 MG tablet Take 1 tablet (100 mg total) by mouth at bedtime. 90 tablet 1   No current facility-administered medications on file prior to visit.     BP (!) 150/52   Temp 98.1 F (36.7 C) (Oral)   Wt 178 lb (80.7 kg)   BMI 33.63 kg/m       Objective:   Physical Exam  Constitutional: She is oriented to person, place, and time. She appears well-developed and well-nourished. No distress.  Cardiovascular: Normal rate, regular rhythm, normal heart sounds and intact distal pulses.  Pulmonary/Chest: Effort normal and breath sounds normal.  Neurological: She is alert and oriented to person, place, and time.  Skin: Skin is warm and dry. She is not diaphoretic.  Psychiatric: She has a normal mood and affect. Her behavior is normal. Judgment and thought content normal.  Nursing note and vitals reviewed.     Assessment & Plan:  I am glad to hear that her symptoms have improved. No change in therapy at this time. Follow up as needed  Dorothyann Peng, NP

## 2018-02-21 ENCOUNTER — Telehealth: Payer: Self-pay | Admitting: Adult Health

## 2018-02-21 NOTE — Telephone Encounter (Unsigned)
Copied from [place] [phone] [phone]. Topic: Quick Communication - Rx Refill/Question >> Feb 21, 2018  4:22 PM Neva Seat wrote: traZODone (DESYREL) 100 MG tablet  Needing refills   [id number] - [place], [place] - [address] AT [place] [address] [place] [address] Phone: [phone] Fax: [phone]

## 2018-02-22 NOTE — Telephone Encounter (Signed)
Noted last refill on Trazodone; 01/28/18; # 90; RF x 1  Called pt. and informed of the refill above.  Checked her medication.  Reported she does have the medication and does not need a refill at this time.  Denied needing any other refills at this time.

## 2018-02-28 ENCOUNTER — Telehealth: Payer: Self-pay | Admitting: Adult Health

## 2018-02-28 NOTE — Telephone Encounter (Signed)
Sent to PCP to advise 

## 2018-02-28 NOTE — Telephone Encounter (Signed)
Copied from [place] [id number]. Topic: Quick Communication - See Telephone Encounter >> Feb 28, 2018 10:09 AM Percell Belt A wrote: CRM for notification. See Telephone encounter for: 02/28/18.  Pt called in and stated that she is on montelukast (SINGULAIR) 10 MG tablet [[id number]]  and she feel like it is not working anymore and would like to know if [name] could change med or any suggestions?   She is going on vacation Monday the 29th and would like something before then?    Please advise Pharmacy - [facility] [address] [place]  [phone]  Best number for pt  [phone]

## 2018-03-01 MED ORDER — ALBUTEROL SULFATE HFA 108 (90 BASE) MCG/ACT IN AERS: INHALATION_SPRAY | RESPIRATORY_TRACT | 2 refills | Status: DC

## 2018-03-01 NOTE — Telephone Encounter (Signed)
Albuterol inhaler sent to the pharmacy by e-scribe.  No further action required.

## 2018-03-01 NOTE — Telephone Encounter (Signed)
We can try albuterol inhaler PRN   Otherwise we can refer to ENT

## 2018-03-21 ENCOUNTER — Encounter: Payer: Self-pay | Admitting: Internal Medicine

## 2018-03-21 ENCOUNTER — Ambulatory Visit (INDEPENDENT_AMBULATORY_CARE_PROVIDER_SITE_OTHER): Payer: Medicare Other | Admitting: Internal Medicine

## 2018-03-21 VITALS — BP 150/60 | HR 66 | Temp 99.2°F | Wt 174.0 lb

## 2018-03-21 DIAGNOSIS — I10 Essential (primary) hypertension: Secondary | ICD-10-CM | POA: Diagnosis not present

## 2018-03-21 MED ORDER — AZITHROMYCIN 250 MG PO TABS: ORAL_TABLET | ORAL | 0 refills | Status: DC

## 2018-03-21 NOTE — Patient Instructions (Addendum)
Take over-the-counter expectorants and cough medications such as  Mucinex DM.  Call if there is no improvement in 5 to 7 days or if  you develop worsening cough, fever, or new symptoms, such as shortness of breath or chest pain.  Take your antibiotic as prescribed until ALL of it is gone, but stop if you develop a rash, swelling, or any side effects of the medication.  Contact our office as soon as possible if  there are side effects of the medication.  Hydrate and Humidify  Drink enough water to keep your urine clear or pale yellow. Staying hydrated will help to thin your mucus.  Use a cool mist humidifier to keep the humidity level in your home above 50%.  Inhale steam for 10-15 minutes, 3-4 times a day or as told by your health care provider. You can do this in the bathroom while a hot shower is running.  Limit your exposure to cool or dry air. Rest  Rest as much as possible.

## 2018-03-21 NOTE — Progress Notes (Signed)
Subjective:    Patient ID: [name], female    DOB: 1928/08/20, 84 y.o.   MRN: [id number]  HPI 84 year old patient who has a history of essential hypertension. She presents with an 8-day history of low-grade fever productive cough poor appetite and progressive weakness.  Cough is productive of yellow-green sputum. Denies any shortness of breath wheezing  Past Medical History:  Diagnosis Date  . Anemia   . Chronic fatigue   . Chronic renal insufficiency, stage III (moderate) (HCC)   . Depression   . HYPERTENSION 04/04/2007  . HYPOTHYROIDISM 04/04/2007  . Macular degeneration      Social History   Socioeconomic History  . Marital status: Widowed    Spouse name: Not on file  . Number of children: Not on file  . Years of education: Not on file  . Highest education level: Not on file  Occupational History  . Occupation: Retired    Fish farm manager: RETIRED  Social Needs  . Financial resource strain: Not on file  . Food insecurity:    Worry: Not on file    Inability: Not on file  . Transportation needs:    Medical: Not on file    Non-medical: Not on file  Tobacco Use  . Smoking status: Former Smoker    Packs/day: 1.00    Years: 7.00    Pack years: 7.00    Types: Cigarettes    Last attempt to quit: 10/13/1969    Years since quitting: 48.4  . Smokeless tobacco: Never Used  Substance and Sexual Activity  . Alcohol use: No  . Drug use: No  . Sexual activity: Not on file  Lifestyle  . Physical activity:    Days per week: Not on file    Minutes per session: Not on file  . Stress: Not on file  Relationships  . Social connections:    Talks on phone: Not on file    Gets together: Not on file    Attends religious service: Not on file    Active member of club or organization: Not on file    Attends meetings of clubs or organizations: Not on file    Relationship status: Not on file  . Intimate partner violence:    Fear of current or ex partner: Not on file    Emotionally  abused: Not on file    Physically abused: Not on file    Forced sexual activity: Not on file  Other Topics Concern  . Not on file  Social History Narrative  . Not on file    Past Surgical History:  Procedure Laterality Date  . ABDOMINAL HYSTERECTOMY    . APPENDECTOMY    . BACK SURGERY  2012  . Cataracts    . CHOLECYSTECTOMY    . ERCP N/A 11/22/2014   Procedure: ENDOSCOPIC RETROGRADE CHOLANGIOPANCREATOGRAPHY (ERCP);  Surgeon: Clarene Essex, MD;  Location: [facility] ENDOSCOPY;  Service: Endoscopy;  Laterality: N/A;  . HERNIA REPAIR    . OOPHORECTOMY    . ORIF TIBIA FRACTURE  2012   dr duda  . SPINE SURGERY  2011   cervical    Family History  Problem Relation Age of Onset  . Kidney cancer Mother 43       renal cell CA  . Heart disease Father   . Lymphoma Son   . Breast cancer Daughter     Allergies  Allergen Reactions  . Oxycodone Hcl     REACTION: itching    Current Outpatient Medications  on File Prior to Visit  Medication Sig Dispense Refill  . albuterol (PROAIR HFA) 108 (90 Base) MCG/ACT inhaler 1-2 puffs every 6 hours as needed. 8 g 2  . amLODipine (NORVASC) 10 MG tablet Take 1 tablet (10 mg total) by mouth daily. 90 tablet 3  . cetirizine (ZYRTEC) 10 MG tablet Take 10 mg by mouth daily.    . clidinium-chlordiazePOXIDE (LIBRAX) 2.5-5 MG per capsule 2 (two) times daily as needed.     Marland Kitchen levothyroxine (SYNTHROID, LEVOTHROID) 137 MCG tablet TAKE 1 TABLET BY MOUTH  DAILY BEFORE BREAKFAST 90 tablet 3  . montelukast (SINGULAIR) 10 MG tablet Take 1 tablet (10 mg total) by mouth at bedtime. 90 tablet 0  . Multiple Vitamin (MULTIVITAMIN) tablet Take 1 tablet by mouth daily.    Marland Kitchen olmesartan (BENICAR) 20 MG tablet Take 1 tablet (20 mg total) by mouth daily. 90 tablet 0  . omeprazole (PRILOSEC) 40 MG capsule Take 1 tablet by mouth daily.     . traZODone (DESYREL) 100 MG tablet Take 1 tablet (100 mg total) by mouth at bedtime. 90 tablet 1   No current facility-administered medications  on file prior to visit.     BP (!) 150/60 (BP Location: Right Arm, Patient Position: Sitting, Cuff Size: Large)   Pulse 66   Temp 99.2 F (37.3 C) (Oral)   Wt 174 lb (78.9 kg)   SpO2 95%   BMI 32.88 kg/m      Review of Systems  Constitutional: Positive for activity change, appetite change and fatigue. Negative for chills and fever.  HENT: Negative for congestion, dental problem, hearing loss, rhinorrhea, sinus pressure, sore throat and tinnitus.   Eyes: Negative for pain, discharge and visual disturbance.  Respiratory: Positive for cough. Negative for shortness of breath.   Cardiovascular: Negative for chest pain, palpitations and leg swelling.  Gastrointestinal: Negative for abdominal distention, abdominal pain, blood in stool, constipation, diarrhea, nausea and vomiting.  Genitourinary: Negative for difficulty urinating, dysuria, flank pain, frequency, hematuria, pelvic pain, urgency, vaginal bleeding, vaginal discharge and vaginal pain.  Musculoskeletal: Negative for arthralgias, gait problem and joint swelling.  Skin: Negative for rash.  Neurological: Positive for weakness. Negative for dizziness, syncope, speech difficulty, numbness and headaches.  Hematological: Negative for adenopathy.  Psychiatric/Behavioral: Negative for agitation, behavioral problems and dysphoric mood. The patient is not nervous/anxious.        Objective:   Physical Exam  Constitutional: She is oriented to person, place, and time. She appears well-developed and well-nourished. No distress.  HENT:  Head: Normocephalic.  Right Ear: External ear normal.  Left Ear: External ear normal.  Mouth/Throat: Oropharynx is clear and moist.  Eyes: Pupils are equal, round, and reactive to light. Conjunctivae and EOM are normal.  Neck: Normal range of motion. Neck supple. No thyromegaly present.  Cardiovascular: Normal rate, regular rhythm, normal heart sounds and intact distal pulses.  Pulmonary/Chest: Effort  normal and breath sounds normal.  Few rales noted at the left base  Abdominal: Soft. Bowel sounds are normal. She exhibits no mass. There is no tenderness.  Musculoskeletal: Normal range of motion.  Lymphadenopathy:    She has no cervical adenopathy.  Neurological: She is alert and oriented to person, place, and time.  Skin: Skin is warm and dry. No rash noted.  Psychiatric: She has a normal mood and affect. Her behavior is normal.          Assessment & Plan:   Acute bronchitis/possible early left lower lobe pneumonia.  Will treat with hydration expectorants and azithromycin.  The patient report any clinical worsening Essential hypertension stable Hypothyroidism.  Continue levothyroxine  Follow-up with PCP in 2 weeks or as needed  Marletta Lor

## 2018-04-20 ENCOUNTER — Telehealth: Payer: Self-pay | Admitting: Adult Health

## 2018-04-20 DIAGNOSIS — J302 Other seasonal allergic rhinitis: Secondary | ICD-10-CM

## 2018-04-20 MED ORDER — MONTELUKAST SODIUM 10 MG PO TABS: 10.0000 mg | ORAL_TABLET | Freq: Every day | ORAL | 0 refills | Status: DC

## 2018-04-20 NOTE — Telephone Encounter (Signed)
Left a message for a return call.  CRM created.  Pt needs appt to be evaluated.

## 2018-04-20 NOTE — Telephone Encounter (Signed)
Singulair 10 MG tab refill Last Refill:01/28/18 #90 tabs with 0 refills Last OV: 03/21/18 PCP: nafziger [facility]acy:[facility] [place] [address] [facility] [place]

## 2018-04-20 NOTE — Telephone Encounter (Signed)
Copied from [place]. Topic: Quick Communication - Rx Refill/Question >> Apr 20, 2018  8:23 AM Scherrie Gerlach wrote: Medication: montelukast (SINGULAIR) 10 MG table 90 day Pt states she did not know she needed to call the pharmacy first, but will do that next time. [id number] - [place], [place] - [address] AT [place] [address] [phone] (Phone) [phone] (Fax)

## 2018-04-20 NOTE — Telephone Encounter (Signed)
Copied from [place] [id number]. Topic: Inquiry >> Apr 20, 2018  8:27 AM Scherrie Gerlach wrote: Reason for CRM: pt states she is having left leg and hip pain and wants to know if she needs an injection in her hip, would [name] be able to do that for her

## 2018-04-22 NOTE — Telephone Encounter (Signed)
I do not do hip injections. This would be orthopedics or sports medicine

## 2018-04-22 NOTE — Telephone Encounter (Signed)
Left a message for a return call.

## 2018-04-22 NOTE — Telephone Encounter (Signed)
Pt does not want an appt she only want to know if  cory could do an hip injection. Pt said she is not in enough pain yet to make an appt. Pt just wanted to know if he could do injection if needed

## 2018-04-22 NOTE — Telephone Encounter (Signed)
Spoke to the pt and notified her that she would need to see sports medicine or ortho to receive injection in hip.  Pt agrees to see either of those when she chooses.

## 2018-06-11 ENCOUNTER — Other Ambulatory Visit: Payer: Self-pay | Admitting: Adult Health

## 2018-06-11 DIAGNOSIS — I10 Essential (primary) hypertension: Secondary | ICD-10-CM

## 2018-06-14 NOTE — Telephone Encounter (Signed)
Sent to the pharmacy by e-scribe. 

## 2018-06-16 ENCOUNTER — Ambulatory Visit: Payer: Medicare Other

## 2018-06-23 ENCOUNTER — Encounter: Payer: Self-pay | Admitting: Adult Health

## 2018-06-23 ENCOUNTER — Ambulatory Visit (INDEPENDENT_AMBULATORY_CARE_PROVIDER_SITE_OTHER): Payer: Medicare Other | Admitting: Adult Health

## 2018-06-23 ENCOUNTER — Ambulatory Visit: Payer: Medicare Other | Admitting: Adult Health

## 2018-06-23 VITALS — BP 160/70 | HR 75 | Temp 98.7°F | Wt 173.0 lb

## 2018-06-23 DIAGNOSIS — J40 Bronchitis, not specified as acute or chronic: Secondary | ICD-10-CM | POA: Diagnosis not present

## 2018-06-23 MED ORDER — DOXYCYCLINE HYCLATE 100 MG PO CAPS
100.0000 mg | ORAL_CAPSULE | Freq: Two times a day (BID) | ORAL | 0 refills | Status: DC
Start: 2018-06-23 — End: 2018-07-01

## 2018-06-23 MED ORDER — IPRATROPIUM-ALBUTEROL 0.5-2.5 (3) MG/3ML IN SOLN: 3.0000 mL | Freq: Once | RESPIRATORY_TRACT | Status: DC

## 2018-06-23 MED ORDER — BENZONATATE 200 MG PO CAPS: 200.0000 mg | ORAL_CAPSULE | Freq: Two times a day (BID) | ORAL | 1 refills | Status: DC | PRN

## 2018-06-23 MED ORDER — PREDNISONE 20 MG PO TABS: 20.0000 mg | ORAL_TABLET | Freq: Every day | ORAL | 0 refills | Status: DC

## 2018-06-23 MED ORDER — IPRATROPIUM-ALBUTEROL 0.5-2.5 (3) MG/3ML IN SOLN
3.0000 mL | Freq: Once | RESPIRATORY_TRACT | Status: AC
  Administered 2018-06-23: 3 mL via RESPIRATORY_TRACT

## 2018-06-23 NOTE — Addendum Note (Signed)
Addended by: Miles Costain T on: 06/23/2018 08:50 AM   Modules accepted: Orders

## 2018-06-23 NOTE — Progress Notes (Signed)
Subjective:    Patient ID: [name], female    DOB: [birth year], 84 y.o.   MRN: [id number]  HPI 84 year old female who  has a past medical history of Anemia, Chronic fatigue, Chronic renal insufficiency, stage III (moderate) ([place]), Depression, HYPERTENSION (04/04/2007), HYPOTHYROIDISM (04/04/2007), and Macular degeneration.  Acute visit   Presents with three weeks of semi productive cough, chest congestion and wheezing. Cough is constant throughout the day. Symptoms have been gradually becoming worse. She has not been using any OTC medications. She has been using her albuterol inhaler PRN and noticed some improvement when using that  She denies fevers, sinus pain/pressure, shortness of breath  Sinus pain/pressure, n/v/d.    Review of Systems See HPI   Past Medical History:  Diagnosis Date  . Anemia   . Chronic fatigue   . Chronic renal insufficiency, stage III (moderate) (HCC)   . Depression   . HYPERTENSION 04/04/2007  . HYPOTHYROIDISM 04/04/2007  . Macular degeneration     Social History   Socioeconomic History  . Marital status: Widowed    Spouse name: Not on file  . Number of children: Not on file  . Years of education: Not on file  . Highest education level: Not on file  Occupational History  . Occupation: Retired    Fish farm manager: RETIRED  Social Needs  . Financial resource strain: Not on file  . Food insecurity:    Worry: Not on file    Inability: Not on file  . Transportation needs:    Medical: Not on file    Non-medical: Not on file  Tobacco Use  . Smoking status: Former Smoker    Packs/day: 1.00    Years: 7.00    Pack years: 7.00    Types: Cigarettes    Last attempt to quit: 10/13/1969    Years since quitting: 48.7  . Smokeless tobacco: Never Used  Substance and Sexual Activity  . Alcohol use: No  . Drug use: No  . Sexual activity: Not on file  Lifestyle  . Physical activity:    Days per week: Not on file    Minutes per session: Not on file  .  Stress: Not on file  Relationships  . Social connections:    Talks on phone: Not on file    Gets together: Not on file    Attends religious service: Not on file    Active member of club or organization: Not on file    Attends meetings of clubs or organizations: Not on file    Relationship status: Not on file  . Intimate partner violence:    Fear of current or ex partner: Not on file    Emotionally abused: Not on file    Physically abused: Not on file    Forced sexual activity: Not on file  Other Topics Concern  . Not on file  Social History Narrative  . Not on file    Past Surgical History:  Procedure Laterality Date  . ABDOMINAL HYSTERECTOMY    . APPENDECTOMY    . BACK SURGERY  2012  . Cataracts    . CHOLECYSTECTOMY    . ERCP N/A 11/22/2014   Procedure: ENDOSCOPIC RETROGRADE CHOLANGIOPANCREATOGRAPHY (ERCP);  Surgeon: Clarene Essex, MD;  Location: [facility] ENDOSCOPY;  Service: Endoscopy;  Laterality: N/A;  . HERNIA REPAIR    . OOPHORECTOMY    . ORIF TIBIA FRACTURE  2012   dr duda  . SPINE SURGERY  2011   cervical  Family History  Problem Relation Age of Onset  . Kidney cancer Mother 56       renal cell CA  . Heart disease Father   . Lymphoma Son   . Breast cancer Daughter     Allergies  Allergen Reactions  . Oxycodone Hcl     REACTION: itching    Current Outpatient Medications on File Prior to Visit  Medication Sig Dispense Refill  . albuterol (PROAIR HFA) 108 (90 Base) MCG/ACT inhaler 1-2 puffs every 6 hours as needed. 8 g 2  . amLODipine (NORVASC) 10 MG tablet Take 1 tablet (10 mg total) by mouth daily. 90 tablet 3  . cetirizine (ZYRTEC) 10 MG tablet Take 10 mg by mouth daily.    . clidinium-chlordiazePOXIDE (LIBRAX) 2.5-5 MG per capsule 2 (two) times daily as needed.     Marland Kitchen levothyroxine (SYNTHROID, LEVOTHROID) 137 MCG tablet TAKE 1 TABLET BY MOUTH  DAILY BEFORE BREAKFAST 90 tablet 3  . montelukast (SINGULAIR) 10 MG tablet Take 1 tablet (10 mg total) by mouth at  bedtime. 90 tablet 0  . Multiple Vitamin (MULTIVITAMIN) tablet Take 1 tablet by mouth daily.    Marland Kitchen olmesartan (BENICAR) 20 MG tablet TAKE 1 TABLET BY MOUTH  DAILY 90 tablet 1  . omeprazole (PRILOSEC) 40 MG capsule Take 1 tablet by mouth daily.     . traZODone (DESYREL) 100 MG tablet Take 1 tablet (100 mg total) by mouth at bedtime. 90 tablet 1   No current facility-administered medications on file prior to visit.     BP (!) 160/70   Pulse 75   Temp 98.7 F (37.1 C)   Wt 173 lb (78.5 kg)   SpO2 95%   BMI 32.69 kg/m       Objective:   Physical Exam  Constitutional: She is oriented to person, place, and time. She appears well-developed and well-nourished. No distress.  Cardiovascular: Normal rate, regular rhythm, normal heart sounds and intact distal pulses.  Pulmonary/Chest: Effort normal. She has wheezes (coarse throughout ) in the right upper field, the right middle field, the right lower field, the left upper field, the left middle field and the left lower field.  Neurological: She is alert and oriented to person, place, and time.  Skin: Skin is warm and dry. She is not diaphoretic.  Psychiatric: She has a normal mood and affect. Her behavior is normal. Judgment and thought content normal.  Nursing note and vitals reviewed.     Assessment & Plan:  1. Bronchitis  - ipratropium-albuterol (DUONEB) 0.5-2.5 (3) MG/3ML nebulizer solution 3 mL - doxycycline (VIBRAMYCIN) 100 MG capsule; Take 1 capsule (100 mg total) by mouth 2 (two) times daily.  Dispense: 14 capsule; Refill: 0 - predniSONE (DELTASONE) 20 MG tablet; Take 1 tablet (20 mg total) by mouth daily with breakfast.  Dispense: 7 tablet; Refill: 0 - benzonatate (TESSALON) 200 MG capsule; Take 1 capsule (200 mg total) by mouth 2 (two) times daily as needed for cough.  Dispense: 20 capsule; Refill: 1  She endorsed improvement in her symptoms after duo neb. Wheezing had improved but was still present. She was moving air easier.    Dorothyann Peng, NP   Dorothyann Peng, NP

## 2018-06-29 ENCOUNTER — Other Ambulatory Visit: Payer: Self-pay

## 2018-06-30 ENCOUNTER — Ambulatory Visit: Payer: Medicare Other

## 2018-06-30 ENCOUNTER — Ambulatory Visit (INDEPENDENT_AMBULATORY_CARE_PROVIDER_SITE_OTHER): Payer: Medicare Other

## 2018-06-30 VITALS — BP 148/52 | HR 74 | Ht 62.0 in | Wt 174.0 lb

## 2018-06-30 DIAGNOSIS — Z0000 Encounter for general adult medical examination without abnormal findings: Secondary | ICD-10-CM

## 2018-06-30 NOTE — Progress Notes (Signed)
Subjective:   [name] is a 84 y.o. female who presents for Medicare Annual/Subsequent preventive examination.  Reports health as fair  Depressed last year but good this year   Lives in town home; ground floor [place] in 2013 and broke left leg; had to crawl to get help through the garage to laundry and had pocket book was on dryer Not going to be intimidated by her age 19 gives you strength when you need it. No future plans to carry a phone with her but states she is careful  If she needs help, she calls her son in [place]   From 2018  Diet  Breakfast; not cooking much Goes out to eat Lives near restaurants  [employer] on Sunday with her son Has someone assists her in the home for cleaning   BMI is 32   Exercise Does not do anything, but walks around the home. Needs a handicapped sticker;  Gets out and goes where she wants to   Stressors: appears tired and stressed;  Focus is not as good Biggest c/o is she is tired from lack of sleep Can't walk very far until she can't continue Almost fell due to tripping  States she fell this past spring on the left side and still hurts   Sleep patterns: states she is losing sleep Takes medicine to assist   Health Maintenance Due  Topic Date Due  . INFLUENZA VACCINE  03/10/2018   will hold flu vaccine due to illness   Discussed future mammograms. Will schedule as she desires   Cardiac Risk Factors include: advanced age (>78men, >76 women);hypertension;family history of premature cardiovascular disease       Objective:    Vitals: BP (!) 148/52   Pulse 74   Ht 5\' 2"  (1.575 m)   Wt 174 lb (78.9 kg)   SpO2 96%   BMI 31.83 kg/m   Body mass index is 31.83 kg/m.  Advanced Directives 06/30/2018 06/15/2017 06/15/2017 11/19/2014  Does Patient Have a Medical Advance Directive? No Yes Yes Yes  Type of Advance Directive - - - Healthcare Power of Attorney    Tobacco Social History   Tobacco Use  Smoking Status  Former Smoker  . Packs/day: 1.00  . Years: 7.00  . Pack years: 7.00  . Types: Cigarettes  . Last attempt to quit: 10/13/1969  . Years since quitting: 48.7  Smokeless Tobacco Never Used     Counseling given: Yes   Clinical Intake:   Past Medical History:  Diagnosis Date  . Anemia   . Chronic fatigue   . Chronic renal insufficiency, stage III (moderate) (HCC)   . Depression   . HYPERTENSION 04/04/2007  . HYPOTHYROIDISM 04/04/2007  . Macular degeneration    Past Surgical History:  Procedure Laterality Date  . ABDOMINAL HYSTERECTOMY    . APPENDECTOMY    . BACK SURGERY  2012  . Cataracts    . CHOLECYSTECTOMY    . ERCP N/A 11/22/2014   Procedure: ENDOSCOPIC RETROGRADE CHOLANGIOPANCREATOGRAPHY (ERCP);  Surgeon: Clarene Essex, MD;  Location: [facility] ENDOSCOPY;  Service: Endoscopy;  Laterality: N/A;  . HERNIA REPAIR    . OOPHORECTOMY    . ORIF TIBIA FRACTURE  2012   dr duda  . SPINE SURGERY  2011   cervical   Family History  Problem Relation Age of Onset  . Kidney cancer Mother 71       renal cell CA  . Heart disease Father   . Lymphoma Son   .  Breast cancer Daughter    Social History   Socioeconomic History  . Marital status: Widowed    Spouse name: Not on file  . Number of children: Not on file  . Years of education: Not on file  . Highest education level: Not on file  Occupational History  . Occupation: Retired    Fish farm manager: RETIRED  Social Needs  . Financial resource strain: Not on file  . Food insecurity:    Worry: Not on file    Inability: Not on file  . Transportation needs:    Medical: Not on file    Non-medical: Not on file  Tobacco Use  . Smoking status: Former Smoker    Packs/day: 1.00    Years: 7.00    Pack years: 7.00    Types: Cigarettes    Last attempt to quit: 10/13/1969    Years since quitting: 48.7  . Smokeless tobacco: Never Used  Substance and Sexual Activity  . Alcohol use: No  . Drug use: No  . Sexual activity: Not on file  Lifestyle  .  Physical activity:    Days per week: Not on file    Minutes per session: Not on file  . Stress: Not on file  Relationships  . Social connections:    Talks on phone: Not on file    Gets together: Not on file    Attends religious service: Not on file    Active member of club or organization: Not on file    Attends meetings of clubs or organizations: Not on file    Relationship status: Not on file  Other Topics Concern  . Not on file  Social History Narrative  . Not on file    Outpatient Encounter Medications as of 06/30/2018  Medication Sig  . albuterol (PROAIR HFA) 108 (90 Base) MCG/ACT inhaler 1-2 puffs every 6 hours as needed.  Marland Kitchen amLODipine (NORVASC) 10 MG tablet Take 1 tablet (10 mg total) by mouth daily.  . benzonatate (TESSALON) 200 MG capsule Take 1 capsule (200 mg total) by mouth 2 (two) times daily as needed for cough.  . cetirizine (ZYRTEC) 10 MG tablet Take 10 mg by mouth daily.  . clidinium-chlordiazePOXIDE (LIBRAX) 2.5-5 MG per capsule 2 (two) times daily as needed.   . doxycycline (VIBRAMYCIN) 100 MG capsule Take 1 capsule (100 mg total) by mouth 2 (two) times daily.  Marland Kitchen levothyroxine (SYNTHROID, LEVOTHROID) 137 MCG tablet TAKE 1 TABLET BY MOUTH  DAILY BEFORE BREAKFAST  . montelukast (SINGULAIR) 10 MG tablet Take 1 tablet (10 mg total) by mouth at bedtime.  . Multiple Vitamin (MULTIVITAMIN) tablet Take 1 tablet by mouth daily.  Marland Kitchen olmesartan (BENICAR) 20 MG tablet TAKE 1 TABLET BY MOUTH  DAILY  . omeprazole (PRILOSEC) 40 MG capsule Take 1 tablet by mouth daily.   . predniSONE (DELTASONE) 20 MG tablet Take 1 tablet (20 mg total) by mouth daily with breakfast.  . traZODone (DESYREL) 100 MG tablet Take 1 tablet (100 mg total) by mouth at bedtime.   No facility-administered encounter medications on file as of 06/30/2018.     Activities of Daily Living In your present state of health, do you have any difficulty performing the following activities: 06/30/2018  Hearing? N    Vision? N  Difficulty concentrating or making decisions? N  Walking or climbing stairs? Y  Dressing or bathing? N  Doing errands, shopping? N  Preparing Food and eating ? N  Using the Toilet? N  In the past  six months, have you accidently leaked urine? N  Do you have problems with loss of bowel control? N  Managing your Medications? N  Managing your Finances? N  Housekeeping or managing your Housekeeping? N  Some recent data might be hidden    Patient Care Team: Dorothyann Peng, NP as PCP - General (Family Medicine)   Assessment:   This is a routine wellness examination for Dannelle.  Exercise Activities and Dietary recommendations Current Exercise Habits: Home exercise routine  Goals    . patient     Sleep more Try the things we talked about     . patient     Educated regarding sleep hygiene    . Patient Stated     Feel better         Fall Risk Fall Risk  06/30/2018 01/15/2017 04/06/2016 11/05/2014 08/09/2013  Falls in the past year? 0 Yes No No No  Comment - - Emmi Telephone Survey: data to providers prior to load - -  Number falls in past yr: - 1 - - -  Injury with Fall? - Yes - - -     Depression Screen PHQ 2/9 Scores 06/30/2018 06/15/2017 01/15/2017 11/05/2014  PHQ - 2 Score 0 2 0 0  PHQ- 9 Score - 9 - -    Cognitive Function MMSE - Mini Mental State Exam 06/30/2018 06/30/2018 06/15/2017  Not completed: (No Data) (No Data) (No Data)       Ad8 score reviewed for issues:  Issues making decisions:  Less interest in hobbies / activities:  Repeats questions, stories (family complaining):  Trouble using ordinary gadgets (microwave, computer, phone):  Forgets the month or year:   Mismanaging finances:   Remembering appts:  Daily problems with thinking and/or memory: Ad8 score is=0      Immunization History  Administered Date(s) Administered  . Influenza Split 05/17/2012  . Influenza Whole 08/18/2007, 05/24/2009, 05/06/2010, 05/10/2012  .  Influenza, High Dose Seasonal PF 06/23/2013, 07/25/2014, 07/07/2016  . Influenza-Unspecified 07/15/2015  . Pneumococcal Conjugate-13 02/11/2017  . Pneumococcal Polysaccharide-23 08/18/2007  . Tdap 09/07/2011     Screening Tests Health Maintenance  Topic Date Due  . INFLUENZA VACCINE  03/10/2018  . TETANUS/TDAP  09/06/2021  . PNA vac Low Risk Adult  Completed       Plan:      PCP Notes   Health Maintenance Hold flu vaccine as she is not feeling well today   Abnormal Screens  BP mod elevated but down to 148 .52    Referrals  none  Patient concerns; States she is still coughing up mostly whitish phelm No chest pain, no SOB States coughing is better but still coughing One episode of bronchial cough while here. No audible wheezing  Pulse ox WNL  Temp 98.7  Does not feel good, has a h/a and this is "unususal for her"  Did advise to come in for apt if not feeling better tomorrow  There was not an apt for today   Offered [facility] at [facility] but she declined  Spoke with Dr. Jerilee Hoh and would not advise to order more antibiotics but would advise her to come in if she does not continue to improve  Has h/a in forehead but states it is better since lunch   Agreed to make an apt and one scheduled with Dr. Sarajane Jews tomorrow 11:45.  States if she feels better, she will cancel, but educated that her 7 days of treatment is ending tonight and if she  does not feel better, to come in.  Advised to take her inhaler's as well as other medication today  She is drinking fluids   Nurse Concerns; As noted  Next PCP apt Will see Dr. Sarajane Jews tomorrow. Cory N not available       I have personally reviewed and noted the following in the patient's chart:   . Medical and social history . Use of alcohol, tobacco or illicit drugs  . Current medications and supplements . Functional ability and status . Nutritional status . Physical activity . Advanced directives . List of other  physicians . Hospitalizations, surgeries, and ER visits in previous 12 months . Vitals . Screenings to include cognitive, depression, and falls . Referrals and appointments  In addition, I have reviewed and discussed with patient certain preventive protocols, quality metrics, and best practice recommendations. A written personalized care plan for preventive services as well as general preventive health recommendations were provided to patient.     Wynetta Fines, RN  06/30/2018

## 2018-06-30 NOTE — Patient Instructions (Addendum)
Ms. Wearing , Thank you for taking time to come for your Medicare Wellness Visit. I appreciate your ongoing commitment to your health goals. Please review the following plan we discussed and let me know if I can assist you in the future.   Will hold flu vaccine today due to illness which has not resolved   Will make an apt to see a doctor tomorrow if she is not better.     These are the goals we discussed: Goals    . patient     Sleep more Try the things we talked about     . patient     Educated regarding sleep hygiene    . Patient Stated     Feel better         This is a list of the screening recommended for you and due dates:  Health Maintenance  Topic Date Due  . Flu Shot  03/10/2018  . Tetanus Vaccine  09/06/2021  . Pneumonia vaccines  Completed      Fall Prevention in the Home Falls can cause injuries. They can happen to people of all ages. There are many things you can do to make your home safe and to help prevent falls. What can I do on the outside of my home?  Regularly fix the edges of walkways and driveways and fix any cracks.  Remove anything that might make you trip as you walk through a door, such as a raised step or threshold.  Trim any bushes or trees on the path to your home.  Use bright outdoor lighting.  Clear any walking paths of anything that might make someone trip, such as rocks or tools.  Regularly check to see if handrails are loose or broken. Make sure that both sides of any steps have handrails.  Any raised decks and porches should have guardrails on the edges.  Have any leaves, snow, or ice cleared regularly.  Use sand or salt on walking paths during winter.  Clean up any spills in your garage right away. This includes oil or grease spills. What can I do in the bathroom?  Use night lights.  Install grab bars by the toilet and in the tub and shower. Do not use towel bars as grab bars.  Use non-skid mats or decals in the tub or  shower.  If you need to sit down in the shower, use a plastic, non-slip stool.  Keep the floor dry. Clean up any water that spills on the floor as soon as it happens.  Remove soap buildup in the tub or shower regularly.  Attach bath mats securely with double-sided non-slip rug tape.  Do not have throw rugs and other things on the floor that can make you trip. What can I do in the bedroom?  Use night lights.  Make sure that you have a light by your bed that is easy to reach.  Do not use any sheets or blankets that are too big for your bed. They should not hang down onto the floor.  Have a firm chair that has side arms. You can use this for support while you get dressed.  Do not have throw rugs and other things on the floor that can make you trip. What can I do in the kitchen?  Clean up any spills right away.  Avoid walking on wet floors.  Keep items that you use a lot in easy-to-reach places.  If you need to reach something above you, use  a strong step stool that has a grab bar.  Keep electrical cords out of the way.  Do not use floor polish or wax that makes floors slippery. If you must use wax, use non-skid floor wax.  Do not have throw rugs and other things on the floor that can make you trip. What can I do with my stairs?  Do not leave any items on the stairs.  Make sure that there are handrails on both sides of the stairs and use them. Fix handrails that are broken or loose. Make sure that handrails are as long as the stairways.  Check any carpeting to make sure that it is firmly attached to the stairs. Fix any carpet that is loose or worn.  Avoid having throw rugs at the top or bottom of the stairs. If you do have throw rugs, attach them to the floor with carpet tape.  Make sure that you have a light switch at the top of the stairs and the bottom of the stairs. If you do not have them, ask someone to add them for you. What else can I do to help prevent  falls?  Wear shoes that: ? Do not have high heels. ? Have rubber bottoms. ? Are comfortable and fit you well. ? Are closed at the toe. Do not wear sandals.  If you use a stepladder: ? Make sure that it is fully opened. Do not climb a closed stepladder. ? Make sure that both sides of the stepladder are locked into place. ? Ask someone to hold it for you, if possible.  Clearly mark and make sure that you can see: ? Any grab bars or handrails. ? First and last steps. ? Where the edge of each step is.  Use tools that help you move around (mobility aids) if they are needed. These include: ? Canes. ? Walkers. ? Scooters. ? Crutches.  Turn on the lights when you go into a dark area. Replace any light bulbs as soon as they burn out.  Set up your furniture so you have a clear path. Avoid moving your furniture around.  If any of your floors are uneven, fix them.  If there are any pets around you, be aware of where they are.  Review your medicines with your doctor. Some medicines can make you feel dizzy. This can increase your chance of falling. Ask your doctor what other things that you can do to help prevent falls. This information is not intended to replace advice given to you by your health care provider. Make sure you discuss any questions you have with your health care provider. Document Released: 05/23/2009 Document Revised: 01/02/2016 Document Reviewed: 08/31/2014 Elsevier Interactive Patient Education  [employer].

## 2018-07-01 ENCOUNTER — Ambulatory Visit (INDEPENDENT_AMBULATORY_CARE_PROVIDER_SITE_OTHER): Payer: Medicare Other | Admitting: Family Medicine

## 2018-07-01 ENCOUNTER — Encounter: Payer: Self-pay | Admitting: Family Medicine

## 2018-07-01 VITALS — BP 162/66 | HR 66 | Temp 98.6°F | Wt 175.3 lb

## 2018-07-01 DIAGNOSIS — J019 Acute sinusitis, unspecified: Secondary | ICD-10-CM

## 2018-07-01 MED ORDER — CEPHALEXIN 500 MG PO CAPS: 500.0000 mg | ORAL_CAPSULE | Freq: Three times a day (TID) | ORAL | 0 refills | Status: DC

## 2018-07-01 NOTE — Progress Notes (Signed)
   Subjective:    Patient ID: [name], female    DOB: Oct 12, 1928, 84 y.o.   MRN: [id number]  HPI Here for continued symptoms of stuffy head, PND, and coughing up yellow sputum. No fever. She was here on 06-23-18 and was given 7 days of Doxycycline and Prednisone. This helped but she still feels somewhat sick. Drinking fluids.    Review of Systems  Constitutional: Negative.   HENT: Positive for congestion, postnasal drip and sinus pressure. Negative for sinus pain and sore throat.   Eyes: Negative.   Respiratory: Positive for cough.        Objective:   Physical Exam  Constitutional: She appears well-developed and well-nourished. No distress.  HENT:  Right Ear: External ear normal.  Left Ear: External ear normal.  Nose: Nose normal.  Mouth/Throat: Oropharynx is clear and moist.  Eyes: Conjunctivae are normal.  Neck: No thyromegaly present.  Pulmonary/Chest: Effort normal and breath sounds normal. No stridor. No respiratory distress. She has no wheezes. She has no rales.  Lymphadenopathy:    She has no cervical adenopathy.          Assessment & Plan:  Partially treated sinusitis. Given Keflex. Recheck prn.  Alysia Penna, MD

## 2018-07-01 NOTE — Progress Notes (Signed)
[name] Keison Glendinning, DO  

## 2018-07-28 ENCOUNTER — Other Ambulatory Visit: Payer: Self-pay | Admitting: Adult Health

## 2018-07-28 DIAGNOSIS — J302 Other seasonal allergic rhinitis: Secondary | ICD-10-CM

## 2018-07-28 DIAGNOSIS — F339 Major depressive disorder, recurrent, unspecified: Secondary | ICD-10-CM

## 2018-07-28 DIAGNOSIS — I10 Essential (primary) hypertension: Secondary | ICD-10-CM

## 2018-07-28 DIAGNOSIS — F5101 Primary insomnia: Secondary | ICD-10-CM

## 2018-07-28 NOTE — Telephone Encounter (Signed)
Pt is also wanting to discuss to increase her montelukast sodium she states that it only last about 4 hours and then she is not able to go back to sleep

## 2018-07-28 NOTE — Telephone Encounter (Signed)
Requested medication (s) are due for refill today: yes  Requested medication (s) are on the active medication list: yes  Last refill:  Montelukast 10 mg on 04/20/18 for 90 tabs and  Trazodone 100 mg on 01/28/18 for 90 and 1 refill  Future visit scheduled: no  Notes to clinic:  Pt want to "discuss increasing Montelukast. Stated that it only last 4 hours and then is not able to go back to sleep".  Requested Prescriptions  Pending Prescriptions Disp Refills   montelukast (SINGULAIR) 10 MG tablet [Pharmacy Med Name: MONTELUKAST 10MG  TABLETS] 90 tablet 0    Sig: TAKE 1 TABLET(10 MG) BY MOUTH AT BEDTIME     Pulmonology:  Leukotriene Inhibitors Passed - 07/28/2018  8:34 AM      Passed - Valid encounter within last 12 months    Recent Outpatient Visits          3 weeks ago Acute sinusitis, recurrence not specified, unspecified location   [facility], Ishmael Holter, MD   1 month ago [place] [facility], [place], NP   4 months ago Essential hypertension   [facility], Doretha Sou, MD   5 months ago Other insomnia   [facility], [place], NP   6 months ago Seasonal allergies   [facility], [place], NP            traZODone (DESYREL) 100 MG tablet [facility] Med Name: TRAZODONE 100MG  TABLETS] 90 tablet 1    Sig: TAKE 1 TABLET(100 MG) BY MOUTH AT BEDTIME     Psychiatry: Antidepressants - Serotonin Modulator Passed - 07/28/2018  8:34 AM      Passed - Valid encounter within last 6 months    Recent Outpatient Visits          3 weeks ago Acute sinusitis, recurrence not specified, unspecified location   [facility], Ishmael Holter, MD   1 month ago [place] at [facility], [place], NP   4 months ago Essential hypertension   [facility], Doretha Sou, MD   5 months ago Other  insomnia   [facility], [place], NP   6 months ago Seasonal allergies   [facility], [place], NP

## 2018-08-01 NOTE — Telephone Encounter (Signed)
Denied.  Filled on 06/14/18 to [facility].

## 2018-08-01 NOTE — Telephone Encounter (Signed)
Cory, pt is using 25 mg of trazodone.  Would you like to lower the strength?

## 2018-08-02 NOTE — Telephone Encounter (Signed)
Ok to fill Trazodone 100 mg QHS for 90 +1.   She is already on the max dose of Singulair. If she is still having coughing issues then I would refer to Pulmonary. She was seen by Dr. Melvyn Novas way back in 2014 for this issue

## 2018-08-02 NOTE — Telephone Encounter (Signed)
Sent to the pharmacy by e-scribe. 

## 2018-08-04 ENCOUNTER — Ambulatory Visit: Payer: Self-pay

## 2018-08-04 NOTE — Telephone Encounter (Signed)
Call returned to patient who is requesting advice on OTC medications for what she thinks are allergy symptoms. Pt sounds very congested.  She states she has a mild HA and her sinuses are dripping or stuffy especially the right side.  She has a congested cough. She states she doesn't feel like doing anything. She sounds sick. She denies fever or chills. Per protocol pt should be seen in office but she refuses.  She states she will call back if OTC medications do not work. Pt was urged again to accept an appointment but refused. Pt will go to pharmacy for coriciden HBP and saline wash for her nose. Pt was encouraged to consult with her pharmacist for appropriate OTC medications. Pt states she was taking Singular but that it was DC'd.  She is not sure why. She is requesting a call back from office. Pt was informed that she would need an appointment for any medication. She states she will call back if needed.  Reason for Disposition . [1] Sinus pain (not just congestion) AND [2] fever  Answer Assessment - Initial Assessment Questions 1. LOCATION: "Where does it hurt?"      Pressure in head 2. ONSET: "When did the sinus pain start?"  (e.g., hours, days)      1 week 3. SEVERITY: "How bad is the pain?"   (Scale 1-10; mild, moderate or severe)   - MILD (1-3): doesn't interfere with normal activities    - MODERATE (4-7): interferes with normal activities (e.g., work or school) or awakens from sleep   - SEVERE (8-10): excruciating pain and patient unable to do any normal activities        Mild Headache 4. RECURRENT SYMPTOM: "Have you ever had sinus problems before?" If so, ask: "When was the last time?" and "What happened that time?"      no 5. NASAL CONGESTION: "Is the nose blocked?" If so, ask, "Can you open it or must you breathe through the mouth?"     Right side 6. NASAL DISCHARGE: "Do you have discharge from your nose?" If so ask, "What color?"     Clear  7. FEVER: "Do you have a fever?" If so, ask:  "What is it, how was it measured, and when did it start?"     No chills 8. OTHER SYMPTOMS: "Do you have any other symptoms?" (e.g., sore throat, cough, earache, difficulty breathing)     Cough mostly clear phlegm 9. PREGNANCY: "Is there any chance you are pregnant?" "When was your last menstrual period?"     N/A  Protocols used: SINUS PAIN OR CONGESTION-A-AH

## 2018-08-04 NOTE — Telephone Encounter (Signed)
Please advise. Patients last OV was 06/23/18 for bronchitis  Does patient need an OV to get a Rx for singular?

## 2018-08-05 NOTE — Telephone Encounter (Signed)
Singular was never d/c and a new prescription was sent in on 12/24   She can try using Flonase

## 2018-08-05 NOTE — Telephone Encounter (Signed)
Left a detailed message on verified voice mail.  Nothing further needed.

## 2018-09-06 ENCOUNTER — Telehealth: Payer: Self-pay | Admitting: Adult Health

## 2018-09-06 DIAGNOSIS — I10 Essential (primary) hypertension: Secondary | ICD-10-CM

## 2018-09-07 NOTE — Telephone Encounter (Signed)
Pt never received medication from [facility]

## 2018-09-07 NOTE — Telephone Encounter (Signed)
Denied.  THIS HAS BEEN SENT TO [facility] ON 06/14/2018,

## 2018-09-08 MED ORDER — OLMESARTAN MEDOXOMIL 20 MG PO TABS: 20.0000 mg | ORAL_TABLET | Freq: Every day | ORAL | 1 refills | Status: DC

## 2018-09-08 NOTE — Addendum Note (Signed)
Addended by: Miles Costain T on: 09/08/2018 09:16 AM   Modules accepted: Orders

## 2018-09-08 NOTE — Telephone Encounter (Signed)
Sent to the pharmacy by e-scribe. 

## 2018-10-11 ENCOUNTER — Other Ambulatory Visit: Payer: Self-pay | Admitting: Adult Health

## 2018-10-11 DIAGNOSIS — J40 Bronchitis, not specified as acute or chronic: Secondary | ICD-10-CM

## 2018-10-12 NOTE — Telephone Encounter (Signed)
Sent to the pharmacy by e-scribe. 

## 2018-10-12 NOTE — Telephone Encounter (Signed)
[place] for refill, 20 + 1

## 2018-12-08 ENCOUNTER — Other Ambulatory Visit: Payer: Self-pay

## 2018-12-08 ENCOUNTER — Encounter: Payer: Self-pay | Admitting: Podiatry

## 2018-12-08 ENCOUNTER — Ambulatory Visit (INDEPENDENT_AMBULATORY_CARE_PROVIDER_SITE_OTHER): Payer: Medicare Other | Admitting: Podiatry

## 2018-12-08 VITALS — BP 157/43 | HR 65 | Temp 97.3°F | Resp 16

## 2018-12-08 DIAGNOSIS — M79675 Pain in left toe(s): Secondary | ICD-10-CM | POA: Diagnosis not present

## 2018-12-08 DIAGNOSIS — M779 Enthesopathy, unspecified: Secondary | ICD-10-CM

## 2018-12-08 DIAGNOSIS — M79674 Pain in right toe(s): Secondary | ICD-10-CM

## 2018-12-08 DIAGNOSIS — B351 Tinea unguium: Secondary | ICD-10-CM | POA: Diagnosis not present

## 2018-12-08 NOTE — Progress Notes (Signed)
Subjective:   Patient ID: [name], female   DOB: 85 y.o.   MRN: [id number]   HPI Patient presents stating that her nails become bothersome on both feet she is not been able to get a pedicure she cannot reach her feet and she has had ingrown toenails which have been bothersome.  States that it is increasingly making it hard to walk patient does not smoke and is not active currently and presents with caregiver   Review of Systems  All other systems reviewed and are negative.       Objective:  Physical Exam Vitals signs and nursing note reviewed.  Constitutional:      Appearance: She is well-developed.  Pulmonary:     Effort: Pulmonary effort is normal.  Musculoskeletal: Normal range of motion.  Skin:    General: Skin is warm.  Neurological:     Mental Status: She is alert.     Neurovascular status found to be mildly diminished but intact with patient found to have thick yellow brittle nailbeds 1-5 both feet that are incurvated in the corners with slight redness but no drainage or odor.  Patient is found to have good digital perfusion     Assessment:  Ingrown toenail deformity with mycotic component bilateral that are painful and also mild left foot pain     Plan:  H&P discussed both conditions and today I debrided nailbeds 1-5 both feet carefully taken off the corners with no iatrogenic bleeding.  I do not think the left foot requires further treatment but if it were to become painful on a consistent basis we will treated

## 2018-12-08 NOTE — Progress Notes (Signed)
   Subjective:    Patient ID: [name], female    DOB: 1928/12/26, 85 y.o.   MRN: [id number]  HPI    Review of Systems  All other systems reviewed and are negative.      Objective:   Physical Exam        Assessment & Plan:

## 2019-02-23 ENCOUNTER — Telehealth: Payer: Self-pay | Admitting: Adult Health

## 2019-02-23 NOTE — Telephone Encounter (Signed)
Copied from [place] [id number]. Topic: Appointment Scheduling - Scheduling Inquiry for Clinic >> Feb 06, 2019 11:46 AM Percell Belt A wrote: Reason for CRM: pt called in and stated she rec' letter to resch CPE.  Office was on the other line Best number is [phone] >> Feb 22, 2019 12:29 PM Lennox Solders wrote: The pt is calling to rsc cpe. I called office 3x

## 2019-02-23 NOTE — Telephone Encounter (Signed)
Tried to [facility] and the phone was picked up and hung up then we were disconnected.  Trying to reschedule the patients physical 03/22/2019 with Dorothyann Peng.

## 2019-03-22 ENCOUNTER — Encounter: Payer: Self-pay | Admitting: Adult Health

## 2019-03-31 ENCOUNTER — Ambulatory Visit (INDEPENDENT_AMBULATORY_CARE_PROVIDER_SITE_OTHER): Payer: Medicare Other | Admitting: Adult Health

## 2019-03-31 ENCOUNTER — Other Ambulatory Visit: Payer: Self-pay

## 2019-03-31 ENCOUNTER — Encounter: Payer: Self-pay | Admitting: Adult Health

## 2019-03-31 VITALS — BP 160/70 | Temp 98.9°F | Ht 62.0 in | Wt 175.0 lb

## 2019-03-31 DIAGNOSIS — I10 Essential (primary) hypertension: Secondary | ICD-10-CM | POA: Diagnosis not present

## 2019-03-31 DIAGNOSIS — K4090 Unilateral inguinal hernia, without obstruction or gangrene, not specified as recurrent: Secondary | ICD-10-CM

## 2019-03-31 DIAGNOSIS — J302 Other seasonal allergic rhinitis: Secondary | ICD-10-CM

## 2019-03-31 DIAGNOSIS — M542 Cervicalgia: Secondary | ICD-10-CM

## 2019-03-31 DIAGNOSIS — Z23 Encounter for immunization: Secondary | ICD-10-CM | POA: Diagnosis not present

## 2019-03-31 DIAGNOSIS — F5104 Psychophysiologic insomnia: Secondary | ICD-10-CM | POA: Diagnosis not present

## 2019-03-31 DIAGNOSIS — E039 Hypothyroidism, unspecified: Secondary | ICD-10-CM

## 2019-03-31 DIAGNOSIS — N183 Chronic kidney disease, stage 3 unspecified: Secondary | ICD-10-CM | POA: Insufficient documentation

## 2019-03-31 DIAGNOSIS — R413 Other amnesia: Secondary | ICD-10-CM

## 2019-03-31 LAB — CBC WITH DIFFERENTIAL/PLATELET
Basophils Absolute: 0.1 10*3/uL (ref 0.0–0.1)
Basophils Relative: 1.9 % (ref 0.0–3.0)
Eosinophils Absolute: 0.2 10*3/uL (ref 0.0–0.7)
Eosinophils Relative: 2.7 % (ref 0.0–5.0)
HCT: 36.7 % (ref 36.0–46.0)
Hemoglobin: 12.1 g/dL (ref 12.0–15.0)
Lymphocytes Relative: 19.7 % (ref 12.0–46.0)
Lymphs Abs: 1.4 10*3/uL (ref 0.7–4.0)
MCHC: 33 g/dL (ref 30.0–36.0)
MCV: 93.7 fl (ref 78.0–100.0)
Monocytes Absolute: 0.7 10*3/uL (ref 0.1–1.0)
Monocytes Relative: 9.3 % (ref 3.0–12.0)
Neutro Abs: 4.7 10*3/uL (ref 1.4–7.7)
Neutrophils Relative %: 66.4 % (ref 43.0–77.0)
Platelets: 319 10*3/uL (ref 150.0–400.0)
RBC: 3.92 Mil/uL (ref 3.87–5.11)
RDW: 13.4 % (ref 11.5–15.5)
WBC: 7.1 10*3/uL (ref 4.0–10.5)

## 2019-03-31 LAB — COMPREHENSIVE METABOLIC PANEL
ALT: 14 U/L (ref 0–35)
AST: 15 U/L (ref 0–37)
Albumin: 4.7 g/dL (ref 3.5–5.2)
Alkaline Phosphatase: 62 U/L (ref 39–117)
BUN: 4 mg/dL — ABNORMAL LOW (ref 6–23)
CO2: 27 mEq/L (ref 19–32)
Calcium: 9.5 mg/dL (ref 8.4–10.5)
Chloride: 106 mEq/L (ref 96–112)
Creatinine, Ser: 1.28 mg/dL — ABNORMAL HIGH (ref 0.40–1.20)
GFR: 39.36 mL/min — ABNORMAL LOW (ref >60.00)
Glucose, Bld: 103 mg/dL — ABNORMAL HIGH (ref 70–99)
Potassium: 5.2 mEq/L — ABNORMAL HIGH (ref 3.5–5.1)
Sodium: 140 mEq/L (ref 135–145)
Total Bilirubin: 0.6 mg/dL (ref 0.2–1.2)
Total Protein: 7.1 g/dL (ref 6.0–8.3)

## 2019-03-31 LAB — LIPID PANEL
Cholesterol: 173 mg/dL (ref 0–200)
HDL: 44.5 mg/dL (ref >39.00)
LDL Cholesterol: 110 mg/dL — ABNORMAL HIGH (ref 0–99)
NonHDL: 128.08
Total CHOL/HDL Ratio: 4
Triglycerides: 91 mg/dL (ref 0.0–149.0)
VLDL: 18.2 mg/dL (ref 0.0–40.0)

## 2019-03-31 LAB — VITAMIN B12: Vitamin B-12: 505 pg/mL (ref 211–911)

## 2019-03-31 LAB — TSH: TSH: 5.88 u[IU]/mL — ABNORMAL HIGH (ref 0.35–4.50)

## 2019-03-31 MED ORDER — MONTELUKAST SODIUM 10 MG PO TABS: 10.0000 mg | ORAL_TABLET | Freq: Every day | ORAL | 3 refills | Status: DC

## 2019-03-31 MED ORDER — OMEPRAZOLE 40 MG PO CPDR: 40.0000 mg | DELAYED_RELEASE_CAPSULE | Freq: Every day | ORAL | 3 refills | Status: DC

## 2019-03-31 MED ORDER — OLMESARTAN MEDOXOMIL 20 MG PO TABS: 20.0000 mg | ORAL_TABLET | Freq: Every day | ORAL | 3 refills | Status: DC

## 2019-03-31 MED ORDER — AMLODIPINE BESYLATE 10 MG PO TABS: 10.0000 mg | ORAL_TABLET | Freq: Every day | ORAL | 3 refills | Status: DC

## 2019-03-31 NOTE — Patient Instructions (Signed)
It was great seeing you today   Please go down to the lab and get your blood work done   We will follow up with you regarding your blood work

## 2019-03-31 NOTE — Progress Notes (Signed)
Subjective:    Patient ID: [name], female    DOB: [birth year], 85 y.o.   MRN: [id number]  HPI  Patient presents for yearly preventative medicine examination. She is a pleasant 85 year old female who  has a past medical history of Anemia, Chronic fatigue, Chronic renal insufficiency, stage III (moderate) ([place]), Depression, HYPERTENSION (04/04/2007), HYPOTHYROIDISM (04/04/2007), and Macular degeneration.  Hypothyroidism-currently is prescribed Synthroid 137 mcg Lab Results  Component Value Date   TSH 2.64 01/21/2018   Hypertension -controlled with Norvasc and Benicar. She did not take her blood pressure medication this morning. She reports monitoring her blood pressure at home and reports readings between 120-140/60-70's.  BP Readings from Last 3 Encounters:  03/31/19 (!) 160/70  12/08/18 (!) 157/43  07/01/18 (!) 162/66   Insomnia -Was prescribed Trazodone but stopped taking this medication as she thought it was "causing more harm than good".  She cannot remember the side effects that she was experiencing.  CKD Stage III Lab Results  Component Value Date   CREATININE 1.25 (H) 01/21/2018   BUN 16 01/21/2018   NA 144 01/21/2018   K 4.4 01/21/2018   CL 108 01/21/2018   CO2 27 01/21/2018   Lab Results  Component Value Date   CREATININE 1.25 (H) 01/21/2018    Cognitive Impairment - she reports that over the last few months she has had worsening cognitive impairment. She is forgetting where she misplaced items around the house and often has word finding issues. She is not getting lost while driving but " I have become nervous about driving now". She reports no issues with performing ADL's but will " sometimes forget to take her medications".   Right Inguinal Hernia -has been present for many years.  She denies issues with bowel movements.  Has not noticed any bruising to the abdomen.  Has not experienced nausea or vomiting.  She does have discomfort at times when she reduces the  hernia.  She refuses to be seen by general surgery at this time  Left Sided Neck Pain-she reports intermittent for at least 1 year.  She states "sometimes I just get a very sharp pain on the side of my neck.  This pain does not last very long and goes away."  She denies fevers, chills, headaches, pain with brushing her hair, or pain with chewing.  All immunizations and health maintenance protocols were reviewed with the patient and needed orders were placed. Due for influenza vaccination   Appropriate screening laboratory values were ordered for the patient including screening of hyperlipidemia, renal function and hepatic function.  Medication reconciliation,  past medical history, social history, problem list and allergies were reviewed in detail with the patient  Goals were established with regard to weight loss, exercise, and  diet in compliance with medications.    End of life planning was discussed. She has an advanced directive and living will.    Review of Systems  Constitutional: Negative.   HENT: Negative.   Eyes: Negative.   Respiratory: Negative.   Cardiovascular: Negative.   Gastrointestinal: Positive for abdominal pain. Negative for constipation, diarrhea and nausea.  Endocrine: Negative.   Genitourinary: Negative.   Musculoskeletal: Positive for arthralgias, back pain and gait problem.  Skin: Negative.   Allergic/Immunologic: Negative.   Neurological: Positive for speech difficulty.  Hematological: Negative.   Psychiatric/Behavioral: Positive for confusion and sleep disturbance.   Past Medical History:  Diagnosis Date   Anemia    Chronic fatigue    Chronic  renal insufficiency, stage III (moderate) ([place])    Depression    HYPERTENSION 04/04/2007   HYPOTHYROIDISM 04/04/2007   Macular degeneration     Social History   Socioeconomic History   Marital status: Widowed    Spouse name: Not on file   Number of children: Not on file   Years of education: Not  on file   Highest education level: Not on file  Occupational History   Occupation: Retired    Fish farm manager: RETIRED  Scientist, product/process development strain: Not on file   Food insecurity    Worry: Not on file    Inability: Not on file   Transportation needs    Medical: Not on file    Non-medical: Not on file  Tobacco Use   Smoking status: Former Smoker    Packs/day: 1.00    Years: 7.00    Pack years: 7.00    Types: Cigarettes    Quit date: 10/13/1969    Years since quitting: 49.4   Smokeless tobacco: Never Used  Substance and Sexual Activity   Alcohol use: No   Drug use: No   Sexual activity: Not on file  Lifestyle   Physical activity    Days per week: Not on file    Minutes per session: Not on file   Stress: Not on file  Relationships   Social connections    Talks on phone: Not on file    Gets together: Not on file    Attends religious service: Not on file    Active member of club or organization: Not on file    Attends meetings of clubs or organizations: Not on file    Relationship status: Not on file   Intimate partner violence    Fear of current or ex partner: Not on file    Emotionally abused: Not on file    Physically abused: Not on file    Forced sexual activity: Not on file  Other Topics Concern   Not on file  Social History Narrative   Not on file    Past Surgical History:  Procedure Laterality Date   [place]  2012   Cataracts     CHOLECYSTECTOMY     ERCP N/A 11/22/2014   Procedure: ENDOSCOPIC RETROGRADE CHOLANGIOPANCREATOGRAPHY (ERCP);  Surgeon: Clarene Essex, MD;  Location: [facility] ENDOSCOPY;  Service: Endoscopy;  Laterality: N/A;   HERNIA REPAIR     OOPHORECTOMY     ORIF TIBIA FRACTURE  2012   dr Sharol Given   SPINE SURGERY  2011   cervical    Family History  Problem Relation Age of Onset   Kidney cancer Mother 59       renal cell CA   Heart disease Father    Lymphoma Son     Breast cancer Daughter     Allergies  Allergen Reactions   Oxycodone Hcl     REACTION: itching    Current Outpatient Medications on File Prior to Visit  Medication Sig Dispense Refill   albuterol (PROAIR HFA) 108 (90 Base) MCG/ACT inhaler 1-2 puffs every 6 hours as needed. 8 g 2   amLODipine (NORVASC) 10 MG tablet Take 1 tablet (10 mg total) by mouth daily. 90 tablet 3   cetirizine (ZYRTEC) 10 MG tablet Take 10 mg by mouth daily.     clidinium-chlordiazePOXIDE (LIBRAX) 2.5-5 MG per capsule 2 (two) times daily as needed.  levothyroxine (SYNTHROID, LEVOTHROID) 137 MCG tablet TAKE 1 TABLET BY MOUTH  DAILY BEFORE BREAKFAST 90 tablet 3   montelukast (SINGULAIR) 10 MG tablet TAKE 1 TABLET(10 MG) BY MOUTH AT BEDTIME 90 tablet 1   Multiple Vitamin (MULTIVITAMIN) tablet Take 1 tablet by mouth daily.     olmesartan (BENICAR) 20 MG tablet Take 1 tablet (20 mg total) by mouth daily. 90 tablet 1   omeprazole (PRILOSEC) 40 MG capsule Take 1 tablet by mouth daily.      traZODone (DESYREL) 100 MG tablet TAKE 1 TABLET(100 MG) BY MOUTH AT BEDTIME 90 tablet 1   No current facility-administered medications on file prior to visit.     BP (!) 160/70    Temp 98.9 F (37.2 C)    Ht 5\' 2"  (1.575 m)    Wt 175 lb (79.4 kg)    BMI 32.01 kg/m       Objective:   Physical Exam Vitals signs and nursing note reviewed.  Constitutional:      General: She is not in acute distress.    Appearance: She is not diaphoretic.  HENT:     Head: Normocephalic and atraumatic.     Right Ear: Tympanic membrane, ear canal and external ear normal. There is no impacted cerumen.     Left Ear: Tympanic membrane, ear canal and external ear normal. There is no impacted cerumen.     Nose: Nose normal. No congestion or rhinorrhea.     Mouth/Throat:     Mouth: Mucous membranes are moist.     Pharynx: Oropharynx is clear. No oropharyngeal exudate or posterior oropharyngeal erythema.  Eyes:     General: No scleral  icterus.       Right eye: No discharge.        Left eye: No discharge.     Conjunctiva/sclera: Conjunctivae normal.     Pupils: Pupils are equal, round, and reactive to light.  Neck:     Musculoskeletal: Normal range of motion and neck supple.     Thyroid: No thyromegaly.     Vascular: No JVD.     Trachea: No tracheal deviation.  Cardiovascular:     Rate and Rhythm: Normal rate and regular rhythm.     Pulses: Normal pulses.     Heart sounds: Normal heart sounds. No murmur. No friction rub. No gallop.   Pulmonary:     Effort: Pulmonary effort is normal. No respiratory distress.     Breath sounds: Normal breath sounds. No stridor. No wheezing, rhonchi or rales.  Chest:     Chest wall: No tenderness.  Abdominal:     General: Bowel sounds are normal. There is no distension.     Palpations: Abdomen is soft. There is no mass.     Tenderness: There is no abdominal tenderness. There is no right CVA tenderness, left CVA tenderness, guarding or rebound.     Hernia: A hernia is present. Hernia is present in the right inguinal area.     Comments: Hernia is soft and easily reduced, she does have some discomfort with reduction  Musculoskeletal: Normal range of motion.        General: No swelling, tenderness, deformity or signs of injury.     Right lower leg: No edema.     Left lower leg: No edema.  Lymphadenopathy:     Cervical: No cervical adenopathy.  Skin:    General: Skin is warm and dry.     Capillary Refill: Capillary refill takes less than  2 seconds.     Coloration: Skin is not jaundiced or pale.     Findings: No bruising, erythema, lesion or rash.  Neurological:     General: No focal deficit present.     Mental Status: She is alert and oriented to person, place, and time.     Cranial Nerves: No cranial nerve deficit.     Sensory: No sensory deficit.     Motor: No weakness or abnormal muscle tone.     Coordination: Coordination normal.     Gait: Gait normal.     Deep Tendon  Reflexes: Reflexes normal.  Psychiatric:        Mood and Affect: Mood normal.        Behavior: Behavior normal.        Thought Content: Thought content normal.        Judgment: Judgment normal.        Assessment & Plan:  . 1. Essential hypertension - BP better controlled at home per patient report - No change in medication at this time  - CBC with Differential/Platelet - Comprehensive metabolic panel - Lipid panel - TSH - Vitamin B12 - amLODipine (NORVASC) 10 MG tablet; Take 1 tablet (10 mg total) by mouth daily.  Dispense: 90 tablet; Refill: 3 - olmesartan (BENICAR) 20 MG tablet; Take 1 tablet (20 mg total) by mouth daily.  Dispense: 90 tablet; Refill: 3  2. Hypothyroidism, unspecified type - Consider dose increase  - CBC with Differential/Platelet - Comprehensive metabolic panel - Lipid panel - TSH - Vitamin B12  3. Chronic insomnia - Does not want medication   4. CKD (chronic kidney disease) stage 3, GFR 30-59 ml/min (HCC) - Consider referral to nephrology  - CBC with Differential/Platelet - Comprehensive metabolic panel - Lipid panel - TSH  5. Need for prophylactic vaccination and inoculation against influenza  - Flu Vaccine QUAD High Dose(Fluad)  6. Age-related memory disorder - Does not want to do an MRI today  - Appears at baseline  - CBC with Differential/Platelet - Comprehensive metabolic panel - Lipid panel - TSH - Vitamin B12  7. Neck pain - Will check for Carotid artery disease - Consider CT angiogram in the future  - VAS US CAROTID; Future  8. Seasonal allergies  - montelukast (SINGULAIR) 10 MG tablet; Take 1 tablet (10 mg total) by mouth at bedtime.  Dispense: 90 tablet; Refill: 3  9. Unilateral inguinal hernia without obstruction or gangrene, recurrence not specified - She refuses surgery unless it is an emergency.  - Red flags advised   Dorothyann Peng, NP

## 2019-04-04 ENCOUNTER — Other Ambulatory Visit: Payer: Self-pay | Admitting: Adult Health

## 2019-04-04 ENCOUNTER — Ambulatory Visit (HOSPITAL_COMMUNITY)
Admission: RE | Admit: 2019-04-04 | Discharge: 2019-04-04 | Disposition: A | Discharge status: Home / Self Care | Payer: Medicare Other | Source: Ambulatory Visit | Attending: Internal Medicine | Admitting: Internal Medicine

## 2019-04-04 ENCOUNTER — Other Ambulatory Visit: Payer: Self-pay

## 2019-04-04 DIAGNOSIS — E039 Hypothyroidism, unspecified: Secondary | ICD-10-CM

## 2019-04-04 DIAGNOSIS — M542 Cervicalgia: Secondary | ICD-10-CM | POA: Insufficient documentation

## 2019-04-05 ENCOUNTER — Other Ambulatory Visit: Payer: Self-pay | Admitting: Adult Health

## 2019-04-05 DIAGNOSIS — E039 Hypothyroidism, unspecified: Secondary | ICD-10-CM

## 2019-04-05 MED ORDER — LEVOTHYROXINE SODIUM 150 MCG PO TABS: 150.0000 ug | ORAL_TABLET | Freq: Every day | ORAL | 0 refills | Status: DC

## 2019-04-07 ENCOUNTER — Telehealth: Payer: Self-pay | Admitting: Adult Health

## 2019-04-07 NOTE — Telephone Encounter (Signed)
Left a message for a return call.

## 2019-04-07 NOTE — Telephone Encounter (Signed)
Pt is very confused about her levothyroxine (SYNTHROID) 150 MCG tablet.  Pt states she was given the same dose she was already on prior.  But pt thinks that [name] was going to increase her dose.

## 2019-04-12 NOTE — Telephone Encounter (Signed)
Spoke to the pt.  She no longer has questions about her medication.  She realized her mistake.  Nothing further needed.

## 2019-04-14 ENCOUNTER — Ambulatory Visit: Payer: Medicare Other | Admitting: Adult Health

## 2019-05-01 ENCOUNTER — Telehealth: Payer: Self-pay

## 2019-05-01 NOTE — Telephone Encounter (Signed)
Copied from [place] [id number]. Topic: General - Other >> May 01, 2019 11:51 AM Percell Belt A wrote: Reason for CRM: pt called in and stated that her daughter is in the hosp and she needs to go out of town.  She would like to know if the levothyroxine (SYNTHROID) 150 MCG tablet [id number] could [facility] today or tomorrow before she leaves ..  she stated she though she was suppose to be on [place] number [phone]

## 2019-05-02 ENCOUNTER — Other Ambulatory Visit: Payer: Self-pay | Admitting: Adult Health

## 2019-05-02 MED ORDER — LEVOTHYROXINE SODIUM 150 MCG PO TABS: 150.0000 ug | ORAL_TABLET | Freq: Every day | ORAL | 0 refills | Status: DC

## 2019-05-02 NOTE — Telephone Encounter (Signed)
Pt notified to pick up rx from the pharmacy.

## 2019-05-02 NOTE — Telephone Encounter (Signed)
Spoke to the pt.  She found out (within the past few days) that her daughter has cancer throughout her body and is dying.  She is going to go to [place] to see her.  Pt still seems to be very confused.  Advised that I would send a message to [facility] to see if we could send in a 30 day supply until she can have follow up lab work.

## 2019-05-02 NOTE — Telephone Encounter (Signed)
I am sorry to hear that. I have sent in Synthroid to [facility]

## 2019-05-08 ENCOUNTER — Other Ambulatory Visit: Payer: Medicare Other

## 2019-05-08 ENCOUNTER — Other Ambulatory Visit (INDEPENDENT_AMBULATORY_CARE_PROVIDER_SITE_OTHER): Payer: Medicare Other

## 2019-05-08 ENCOUNTER — Other Ambulatory Visit: Payer: Self-pay

## 2019-05-08 DIAGNOSIS — E039 Hypothyroidism, unspecified: Secondary | ICD-10-CM | POA: Diagnosis not present

## 2019-05-08 LAB — T3, FREE: T3, Free: 2.6 pg/mL (ref 2.3–4.2)

## 2019-05-08 LAB — TSH: TSH: 0.38 u[IU]/mL (ref 0.35–4.50)

## 2019-05-08 LAB — T4, FREE: Free T4: 1.12 ng/dL (ref 0.60–1.60)

## 2019-05-11 ENCOUNTER — Other Ambulatory Visit: Payer: Self-pay | Admitting: Adult Health

## 2019-05-11 ENCOUNTER — Ambulatory Visit: Payer: Self-pay | Admitting: *Deleted

## 2019-05-11 NOTE — Telephone Encounter (Signed)
Sent to the pharmacy by e-scribe. 

## 2019-05-11 NOTE — Telephone Encounter (Signed)
Spoke to the pt and advised that she should be on 141mcg and NOT the 123mcg.

## 2019-05-11 NOTE — Telephone Encounter (Signed)
Pt states she just picked up levothyroxine (SYNTHROID) 150 MCG tablet and she wants to know if that is correct? She thought she used to take only 50 mcg and concerned she may be taking 3 x too much;  the pt says that her PCP  told her that he was changing her from 23 to 45; explained to pt that her prescription was changed from 137 mcg to 150 mcg; she would like clarification; the pt sees [employer], [facility], and can be contacted at [phone].    Reason for Disposition . [1] Caller has URGENT medication question about med that PCP or specialist prescribed AND [2] triager unable to answer question  Answer Assessment - Initial Assessment Questions 1.   NAME of MEDICATION: "What medicine are you calling about?"     Levothyroxine 2.   QUESTION: "What is your question?"    What dose should be take 3.   PRESCRIBING HCP: "Who prescribed it?" Reason: if prescribed by specialist, call should be referred to that group.     Cory Nafziger 4. SYMPTOMS: "Do you have any symptoms?"     no 5. SEVERITY: If symptoms are present, ask "Are they mild, moderate or severe?"   n/a 6.  PREGNANCY:  "Is there any chance that you are pregnant?" "When was your last menstrual period?"  no  Protocols used: MEDICATION QUESTION CALL-A-AH

## 2019-05-30 ENCOUNTER — Telehealth: Payer: Self-pay | Admitting: Adult Health

## 2019-05-30 MED ORDER — LEVOTHYROXINE SODIUM 150 MCG PO TABS: ORAL_TABLET | ORAL | 1 refills | Status: DC

## 2019-05-30 NOTE — Telephone Encounter (Signed)
levothyroxine (SYNTHROID) 150 MCG tablet    Patient requesting a refill of this medication.    Pharmacy:  [place] [place] [facility] [phone] (Phone) [phone] (Fax)

## 2019-05-31 MED ORDER — LEVOTHYROXINE SODIUM 150 MCG PO TABS: ORAL_TABLET | ORAL | 0 refills | Status: DC

## 2019-05-31 NOTE — Addendum Note (Signed)
Addended by: Miles Costain T on: 05/31/2019 03:56 PM   Modules accepted: Orders

## 2019-05-31 NOTE — Telephone Encounter (Signed)
Pt stated she is going out of town on Friday morning to see her daughter who does not have long to live. She needs rx for levothyroxine (SYNTHROID) 150 MCG tablet 30 day supply [facility] of [facility] as it will not arrive in time and she does not have enough to last the trip. Please advise.   [facility] [id number] - [place], [place] - [address] AT [place] [address] [phone] (Phone) [phone] (Fax)

## 2019-05-31 NOTE — Telephone Encounter (Signed)
Sent to the pharmacy by e-scribe. 

## 2019-06-13 ENCOUNTER — Telehealth: Payer: Self-pay | Admitting: Adult Health

## 2019-06-13 NOTE — Telephone Encounter (Signed)
Pt called and asked to speak with [facility] or the nurse, she stated she was exposed to covid and is a bit uneasy about it. She also stated that her daughter has passed and she is not feeling well. Pt asked for testing site info/ and what to do if you are positive for covid please advise

## 2019-06-13 NOTE — Telephone Encounter (Signed)
Spoke to the pt.  She said she has an appt at 10 AM on 06/14/2019 to be tested for Covid at the [facility] site  . Pt has family members that have tested positive and she has been around them due to daughters funeral.  Pt states she currently feels "bad."  Scheduled her to discuss with [name] incase test comes back negative or if [name] thinks she may have something else.  Nothing further needed.  Will forward as FYI.

## 2019-06-14 ENCOUNTER — Other Ambulatory Visit: Payer: Self-pay

## 2019-06-14 ENCOUNTER — Ambulatory Visit (INDEPENDENT_AMBULATORY_CARE_PROVIDER_SITE_OTHER): Payer: Medicare Other | Admitting: Adult Health

## 2019-06-14 ENCOUNTER — Encounter: Payer: Self-pay | Admitting: Adult Health

## 2019-06-14 DIAGNOSIS — Z20828 Contact with and (suspected) exposure to other viral communicable diseases: Secondary | ICD-10-CM | POA: Diagnosis not present

## 2019-06-14 DIAGNOSIS — Z20822 Contact with and (suspected) exposure to covid-19: Secondary | ICD-10-CM

## 2019-06-14 NOTE — Progress Notes (Signed)
Virtual Visit via Telephone Note  I connected with [name] on 06/14/19 at  8:00 AM EST by telephone and verified that I am speaking with the correct person using two identifiers.   I discussed the limitations, risks, security and privacy concerns of performing an evaluation and management service by telephone and the availability of in person appointments. I also discussed with the patient that there may be a patient responsible charge related to this service. The patient expressed understanding and agreed to proceed.  Location patient: home Location provider: work or home office Participants present for the call: patient, provider Patient did not have a visit in the prior 7 days to address this/these issue(s).   History of Present Illness: 85 year old female who is being evaluated today for concern of Covid.  She was in [place] last week after the passing of her daughter from cancer.  One of the family members tested positive for Covid.  Her symptoms started 5 days ago with a cough, headache, subjective fevers, and loss of appetite.  She denies shortness of breath, chills, loss of taste or smell, or diarrhea.   Observations/Objective: Patient sounds cheerful and well on the phone. I do not appreciate any SOB. Speech and thought processing are grossly intact. Patient reported vitals:  Assessment and Plan: 1. Exposure to COVID-19 virus -Vies to go to [facility] for [employer] testing.  If she develops shortness of breath, difficulty breathing, or fever then she needs to go to the emergency room.  She was advised to rest and stay hydrated.  We will follow-up with her once Covid testing has resulted. - Novel Coronavirus, NAA ([facility])   Follow Up Instructions:  I did not refer this patient for an OV in the next 24 hours for this/these issue(s).  I discussed the assessment and treatment plan with the patient. The patient was provided an opportunity to ask questions and all were  answered. The patient agreed with the plan and demonstrated an understanding of the instructions.   The patient was advised to call back or seek an in-person evaluation if the symptoms worsen or if the condition fails to improve as anticipated.  I provided 15 minutes of non-face-to-face time during this encounter.   Dorothyann Peng, NP

## 2019-06-14 NOTE — Addendum Note (Signed)
Addended by: Terence Lux on: 06/14/2019 10:48 AM   Modules accepted: Orders

## 2019-06-15 ENCOUNTER — Telehealth: Payer: Self-pay

## 2019-06-15 LAB — NOVEL CORONAVIRUS, NAA: SARS-CoV-2, NAA: DETECTED — AB

## 2019-06-15 NOTE — Telephone Encounter (Signed)
Pt. Was given positive COVID 19 results and recommendations for care per CMA. (see result note) Per pt. request, COVID 19 quarantine guidelines, per CDC recommendations, and safe practice guidelines to follow, at home, were mailed to pt's. Home address.

## 2019-06-19 ENCOUNTER — Telehealth: Payer: Self-pay | Admitting: Adult Health

## 2019-06-19 NOTE — Telephone Encounter (Signed)
Spoke with patient. Patient reports her head is "roaring" and she is  extremely tired.  Informed patient there is medication that with just knock the virus out the body. Advised patient can take OTC medication for headache, drink fluids and get plenty of rest since this is a virus. Patient then inquired about Trump and asked why can't she get the medication he was prescribed. Informed patient that Im not privy to that information and do not know what he was prescribed. Patient " well thank you, I have another call coming in." and disconnected call.

## 2019-06-19 NOTE — Telephone Encounter (Signed)
Patient is calling because she has COVID. She request some medication to knock the rest of the virus out of her body. Patient lives alone and is extremely fatigued.  Cb- [phone]

## 2019-06-26 ENCOUNTER — Ambulatory Visit: Payer: Self-pay | Admitting: *Deleted

## 2019-06-26 NOTE — Telephone Encounter (Signed)
Pt called with complaints of weakness; she has lack of appetite; she would like "something to strengthen her body"; th pt recently lost her daughter; the pt tested posted COVID 06/14/2019; she also refuses to be seen in the ED, and is not able to do a virtual visit because because she does not have a computer or smart phone; she can be contacted a [phone]; the [t sees [employer], [employer]; will route to office for final disposition..  Reason for Disposition . Requesting regular office appointment  Answer Assessment - Initial Assessment Questions 1. REASON FOR CALL or QUESTION: "What is your reason for calling today?" or "How can I best help you?" or "What question do you have that I can help answer?"     "I need something to strengthen my body"  Protocols used: INFORMATION ONLY CALL-A-AH

## 2019-06-26 NOTE — Telephone Encounter (Signed)
Spoke with patient. Advised patient to go to ED patient declined. Also patient a telephone call with other providers since her PCP is out of the office. Also educated patient on the importance of staying hydrated and getting rest. Patient verbalized understanding. Patient reports "something in my head just broke and my nose is running and I feel some relief."  Advised patient if she gets worse to proceed immediately to ED. Patient declined

## 2019-06-28 ENCOUNTER — Other Ambulatory Visit: Payer: Self-pay

## 2019-06-28 DIAGNOSIS — Z20828 Contact with and (suspected) exposure to other viral communicable diseases: Secondary | ICD-10-CM | POA: Diagnosis not present

## 2019-06-28 DIAGNOSIS — Z20822 Contact with and (suspected) exposure to covid-19: Secondary | ICD-10-CM

## 2019-06-30 ENCOUNTER — Telehealth: Payer: Self-pay | Admitting: *Deleted

## 2019-06-30 LAB — NOVEL CORONAVIRUS, NAA: SARS-CoV-2, NAA: NOT DETECTED

## 2019-06-30 NOTE — Telephone Encounter (Signed)
Copied from [place] [id number]. Topic: General - Other >> Jun 30, 2019 10:07 AM Keene Breath wrote: Reason for CRM: Patient's son called to schedule an appt. For the patient to come in for low energy level.  Tried the office but no answer.  Please call son to schedule appt.  At [phone].

## 2019-06-30 NOTE — Telephone Encounter (Signed)
Pt is scheduled for 07/04/2019.

## 2019-07-04 ENCOUNTER — Telehealth (INDEPENDENT_AMBULATORY_CARE_PROVIDER_SITE_OTHER): Payer: Medicare Other | Admitting: Adult Health

## 2019-07-04 ENCOUNTER — Other Ambulatory Visit: Payer: Self-pay

## 2019-07-04 VITALS — BP 164/74 | Temp 97.1°F | Wt 163.0 lb

## 2019-07-04 DIAGNOSIS — R5383 Other fatigue: Secondary | ICD-10-CM | POA: Diagnosis not present

## 2019-07-04 DIAGNOSIS — F339 Major depressive disorder, recurrent, unspecified: Secondary | ICD-10-CM

## 2019-07-04 DIAGNOSIS — F329 Major depressive disorder, single episode, unspecified: Secondary | ICD-10-CM

## 2019-07-04 DIAGNOSIS — F32A Depression, unspecified: Secondary | ICD-10-CM

## 2019-07-04 MED ORDER — BUPROPION HCL ER (SR) 150 MG PO TB12: 150.0000 mg | ORAL_TABLET | Freq: Two times a day (BID) | ORAL | 0 refills | Status: DC

## 2019-07-04 NOTE — Progress Notes (Signed)
Subjective:    Patient ID: [name], female    DOB: 1929-01-20, 85 y.o.   MRN: [id number]  HPI 85 year old female who  has a past medical history of Anemia, Chronic fatigue, Chronic renal insufficiency, stage III (moderate), Depression, HYPERTENSION (04/04/2007), HYPOTHYROIDISM (04/04/2007), and Macular degeneration.  85 year old female who is with her son today at this visit.  She reports that she is suffering from fatigue as well as depression.  She recently lost her daughter to cancer, and while she was down in [place] with her daughter she contracted Covid.  Luckily her symptoms were mild with a couple days of productive cough as well as fatigue.  Cough is resolved.  She is not leaving the house and she often has multiple crying spells throughout the day.  She denies suicidal ideation.  She feels overwhelmed by the loss of her daughter.   Review of Systems See HPI   Past Medical History:  Diagnosis Date  . Anemia   . Chronic fatigue   . Chronic renal insufficiency, stage III (moderate)   . Depression   . HYPERTENSION 04/04/2007  . HYPOTHYROIDISM 04/04/2007  . Macular degeneration     Social History   Socioeconomic History  . Marital status: Widowed    Spouse name: Not on file  . Number of children: Not on file  . Years of education: Not on file  . Highest education level: Not on file  Occupational History  . Occupation: Retired    Fish farm manager: RETIRED  Social Needs  . Financial resource strain: Not on file  . Food insecurity    Worry: Not on file    Inability: Not on file  . Transportation needs    Medical: Not on file    Non-medical: Not on file  Tobacco Use  . Smoking status: Former Smoker    Packs/day: 1.00    Years: 7.00    Pack years: 7.00    Types: Cigarettes    Quit date: 10/13/1969    Years since quitting: 49.7  . Smokeless tobacco: Never Used  Substance and Sexual Activity  . Alcohol use: No  . Drug use: No  . Sexual activity: Not on file  Lifestyle   . Physical activity    Days per week: Not on file    Minutes per session: Not on file  . Stress: Not on file  Relationships  . Social Herbalist on phone: Not on file    Gets together: Not on file    Attends religious service: Not on file    Active member of club or organization: Not on file    Attends meetings of clubs or organizations: Not on file    Relationship status: Not on file  . Intimate partner violence    Fear of current or ex partner: Not on file    Emotionally abused: Not on file    Physically abused: Not on file    Forced sexual activity: Not on file  Other Topics Concern  . Not on file  Social History Narrative  . Not on file    Past Surgical History:  Procedure Laterality Date  . ABDOMINAL HYSTERECTOMY    . APPENDECTOMY    . BACK SURGERY  2012  . Cataracts    . CHOLECYSTECTOMY    . ERCP N/A 11/22/2014   Procedure: ENDOSCOPIC RETROGRADE CHOLANGIOPANCREATOGRAPHY (ERCP);  Surgeon: Clarene Essex, MD;  Location: [facility] ENDOSCOPY;  Service: Endoscopy;  Laterality: N/A;  . HERNIA  REPAIR    . OOPHORECTOMY    . ORIF TIBIA FRACTURE  2012   dr duda  . SPINE SURGERY  2011   cervical    Family History  Problem Relation Age of Onset  . Kidney cancer Mother 82       renal cell CA  . Heart disease Father   . Lymphoma Son   . Breast cancer Daughter     Allergies  Allergen Reactions  . Oxycodone Hcl     REACTION: itching    Current Outpatient Medications on File Prior to Visit  Medication Sig Dispense Refill  . albuterol (PROAIR HFA) 108 (90 Base) MCG/ACT inhaler 1-2 puffs every 6 hours as needed. 8 g 2  . amLODipine (NORVASC) 10 MG tablet Take 1 tablet (10 mg total) by mouth daily. 90 tablet 3  . cetirizine (ZYRTEC) 10 MG tablet Take 10 mg by mouth daily.    . clidinium-chlordiazePOXIDE (LIBRAX) 2.5-5 MG per capsule 2 (two) times daily as needed.     Marland Kitchen levothyroxine (SYNTHROID) 150 MCG tablet TAKE 1 TABLET(150 MCG) BY MOUTH DAILY 30 tablet 0  .  montelukast (SINGULAIR) 10 MG tablet Take 1 tablet (10 mg total) by mouth at bedtime. 90 tablet 3  . Multiple Vitamin (MULTIVITAMIN) tablet Take 1 tablet by mouth daily.    Marland Kitchen olmesartan (BENICAR) 20 MG tablet Take 1 tablet (20 mg total) by mouth daily. 90 tablet 3  . omeprazole (PRILOSEC) 40 MG capsule Take 1 capsule (40 mg total) by mouth daily. 90 capsule 3   No current facility-administered medications on file prior to visit.     BP (!) 164/74   Temp (!) 97.1 F (36.2 C) (Temporal)   Wt 163 lb (73.9 kg)   BMI 29.81 kg/m       Objective:   Physical Exam Vitals signs and nursing note reviewed.  Constitutional:      Appearance: Normal appearance.  Cardiovascular:     Rate and Rhythm: Normal rate and regular rhythm.     Pulses: Normal pulses.     Heart sounds: Normal heart sounds.  Neurological:     General: No focal deficit present.     Mental Status: She is alert and oriented to person, place, and time.  Psychiatric:        Mood and Affect: Mood is depressed. Affect is flat and tearful.        Behavior: Behavior normal.        Thought Content: Thought content normal.        Judgment: Judgment normal.       Assessment & Plan:  1. Depression, recurrent ([place]) -We will start on low-dose Wellbutrin.  Reviewed side effects with the patient.  She was advised to follow-up in 30 days or sooner if needed.  If she has any thoughts of suicide then she needs to stop the medication and go to the emergency room immediately - buPROPion (WELLBUTRIN SR) 150 MG 12 hr tablet; Take 1 tablet (150 mg total) by mouth 2 (two) times daily.  Dispense: 60 tablet; Refill: 0  2. Fatigue due to depression -Likely multifactorial due to depression and recent covid infection  - Get outside of as much as possible  - Spend time with her son and other friends - Follow up in 30 days or sooner if needed  Dorothyann Peng, NP

## 2019-07-04 NOTE — Patient Instructions (Signed)
It was great seeing you today   I have prescribed Wellbutrin to help with your depression. Take this with food   Follow up with me in 1 month

## 2019-07-11 ENCOUNTER — Telehealth: Payer: Self-pay

## 2019-07-11 ENCOUNTER — Other Ambulatory Visit: Payer: Self-pay | Admitting: Adult Health

## 2019-07-11 DIAGNOSIS — F4323 Adjustment disorder with mixed anxiety and depressed mood: Secondary | ICD-10-CM

## 2019-07-11 MED ORDER — ESCITALOPRAM OXALATE 5 MG PO TABS: 5.0000 mg | ORAL_TABLET | Freq: Every day | ORAL | 0 refills | Status: DC

## 2019-07-11 NOTE — Telephone Encounter (Signed)
Copied from [place] [id number]. Topic: General - Inquiry >> Jul 11, 2019  9:56 AM Reyne Dumas L wrote: Reason for CRM:  Pt's son calling and states that he needs to speak with [facility] about the medication that [name] put pt on last week. >> Jul 11, 2019 12:52 PM Carolyn Stare wrote:  Son call him on his cell [phone]

## 2019-07-11 NOTE — Telephone Encounter (Signed)
I have sent in a low dose of Lexapro to her local pharmacy

## 2019-07-11 NOTE — Telephone Encounter (Signed)
Spoke with pt's son, [name] ([place]), he reports pt is complaining about the new medication (Wellbutrin) is making her more anxious/depressed and she is having jitters and is not wanting to take it any longer. She has already stopped the medication. Son would like to know what else pt can try. Please advise.    Please call son, [phone] [name]

## 2019-07-11 NOTE — Telephone Encounter (Signed)
Pt's son advised of new rx. Also advised to have pt take for at least one week before reporting progress unless significant side effects. He voiced understanding.

## 2019-07-31 ENCOUNTER — Other Ambulatory Visit: Payer: Self-pay | Admitting: Adult Health

## 2019-07-31 DIAGNOSIS — F339 Major depressive disorder, recurrent, unspecified: Secondary | ICD-10-CM

## 2019-08-02 ENCOUNTER — Ambulatory Visit: Payer: Medicare Other | Admitting: Adult Health

## 2019-08-02 NOTE — Telephone Encounter (Signed)
DENIED.  PT IS NO LONGER TAKING THIS MEDICATION. 

## 2019-08-09 ENCOUNTER — Other Ambulatory Visit: Payer: Self-pay | Admitting: Adult Health

## 2019-08-09 DIAGNOSIS — F4323 Adjustment disorder with mixed anxiety and depressed mood: Secondary | ICD-10-CM

## 2019-08-09 NOTE — Telephone Encounter (Signed)
Denied.  Pt never started the medication.

## 2019-09-25 ENCOUNTER — Ambulatory Visit (INDEPENDENT_AMBULATORY_CARE_PROVIDER_SITE_OTHER): Payer: Medicare Other

## 2019-09-25 VITALS — Ht 62.0 in | Wt 166.0 lb

## 2019-09-25 DIAGNOSIS — Z0000 Encounter for general adult medical examination without abnormal findings: Secondary | ICD-10-CM

## 2019-09-25 NOTE — Patient Instructions (Addendum)
[name] , Thank you for taking time to participate in your Medicare Wellness Visit. I appreciate your ongoing commitment to your health goals. Please review the following plan we discussed and let me know if I can assist you in the future.   Screening recommendations/referrals: Colorectal Screening: colonoscopy 12/28/2012; no longer necessary due to advanced age. Mammogram: last one 07/25/2015; patient declines to continue to do yearly. She will do if she has a problem. Bone Density: patient declines due to age.  Vision and Dental Exams: Recommended annual ophthalmology exams for early detection of glaucoma and other disorders of the eye Recommended annual dental exams for proper oral hygiene  Diabetic Exams: Diabetic Eye Exam: N/A Diabetic Foot Exam:N/A  Vaccinations: Influenza vaccine: completed 03/31/2019; due again in fall 2021. Pneumococcal vaccine: completed 08/18/2007 & 02/11/2017. Up to date. Tdap vaccine: completed 09/07/2011; due again 09/06/2021/ Shingles vaccine: patient declines at this time.  Advanced directives: Advance directives discussed with you today. Please bring a copy of your POA (Power of [place]) and/or Living Will to your next appointment.  Goals: Recommend to drink at least 6-8 8oz glasses of water per day.  Recommend to exercise for at least 150 minutes per week.  Recommend to remove any items from the home that may cause slips or trips.  Next appointment: Please schedule your Annual Wellness Visit with your Nurse Health Advisor in one year.  Preventive Care 29 Years and Older, Female Preventive care refers to lifestyle choices and visits with your health care provider that can promote health and wellness. What does preventive care include?  A yearly physical exam. This is also called an annual well check.  Dental exams once or twice a year.  Routine eye exams. Ask your health care provider how often you should have your eyes checked.  Personal  lifestyle choices, including:  Daily care of your teeth and gums.  Regular physical activity.  Eating a healthy diet.  Avoiding tobacco and drug use.  Limiting alcohol use.  Practicing safe sex.  Taking low-dose aspirin every day if recommended by your health care provider.  Taking vitamin and mineral supplements as recommended by your health care provider. What happens during an annual well check? The services and screenings done by your health care provider during your annual well check will depend on your age, overall health, lifestyle risk factors, and family history of disease. Counseling  Your health care provider may ask you questions about your:  Alcohol use.  Tobacco use.  Drug use.  Emotional well-being.  Home and relationship well-being.  Sexual activity.  Eating habits.  History of falls.  Memory and ability to understand (cognition).  Work and work Statistician.  Reproductive health. Screening  You may have the following tests or measurements:  Height, weight, and BMI.  Blood pressure.  Lipid and cholesterol levels. These may be checked every 5 years, or more frequently if you are over 92 years old.  Skin check.  Lung cancer screening. You may have this screening every year starting at age 63 if you have a 30-pack-year history of smoking and currently smoke or have quit within the past 15 years.  Fecal occult blood test (FOBT) of the stool. You may have this test every year starting at age 40.  Flexible sigmoidoscopy or colonoscopy. You may have a sigmoidoscopy every 5 years or a colonoscopy every 10 years starting at age 15.  Hepatitis C blood test.  Hepatitis B blood test.  Sexually transmitted disease (STD) testing.  Diabetes screening. This is done by checking your blood sugar (glucose) after you have not eaten for a while (fasting). You may have this done every 1-3 years.  Bone density scan. This is done to screen for osteoporosis.  You may have this done starting at age 22.  Mammogram. This may be done every 1-2 years. Talk to your health care provider about how often you should have regular mammograms. Talk with your health care provider about your test results, treatment options, and if necessary, the need for more tests. Vaccines  Your health care provider may recommend certain vaccines, such as:  Influenza vaccine. This is recommended every year.  Tetanus, diphtheria, and acellular pertussis (Tdap, Td) vaccine. You may need a Td booster every 10 years.  Zoster vaccine. You may need this after age 31.  Pneumococcal 13-valent conjugate (PCV13) vaccine. One dose is recommended after age 16.  Pneumococcal polysaccharide (PPSV23) vaccine. One dose is recommended after age 40. Talk to your health care provider about which screenings and vaccines you need and how often you need them. This information is not intended to replace advice given to you by your health care provider. Make sure you discuss any questions you have with your health care provider. Document Released: 08/23/2015 Document Revised: 04/15/2016 Document Reviewed: 05/28/2015 Elsevier Interactive Patient Education  2017 [place] Prevention in the Home Falls can cause injuries. They can happen to people of all ages. There are many things you can do to make your home safe and to help prevent falls. What can I do on the outside of my home?  Regularly fix the edges of walkways and driveways and fix any cracks.  Remove anything that might make you trip as you walk through a door, such as a raised step or threshold.  Trim any bushes or trees on the path to your home.  Use bright outdoor lighting.  Clear any walking paths of anything that might make someone trip, such as rocks or tools.  Regularly check to see if handrails are loose or broken. Make sure that both sides of any steps have handrails.  Any raised decks and porches should have  guardrails on the edges.  Have any leaves, snow, or ice cleared regularly.  Use sand or salt on walking paths during winter.  Clean up any spills in your garage right away. This includes oil or grease spills. What can I do in the bathroom?  Use night lights.  Install grab bars by the toilet and in the tub and shower. Do not use towel bars as grab bars.  Use non-skid mats or decals in the tub or shower.  If you need to sit down in the shower, use a plastic, non-slip stool.  Keep the floor dry. Clean up any water that spills on the floor as soon as it happens.  Remove soap buildup in the tub or shower regularly.  Attach bath mats securely with double-sided non-slip rug tape.  Do not have throw rugs and other things on the floor that can make you trip. What can I do in the bedroom?  Use night lights.  Make sure that you have a light by your bed that is easy to reach.  Do not use any sheets or blankets that are too big for your bed. They should not hang down onto the floor.  Have a firm chair that has side arms. You can use this for support while you get dressed.  Do not have throw  rugs and other things on the floor that can make you trip. What can I do in the kitchen?  Clean up any spills right away.  Avoid walking on wet floors.  Keep items that you use a lot in easy-to-reach places.  If you need to reach something above you, use a strong step stool that has a grab bar.  Keep electrical cords out of the way.  Do not use floor polish or wax that makes floors slippery. If you must use wax, use non-skid floor wax.  Do not have throw rugs and other things on the floor that can make you trip. What can I do with my stairs?  Do not leave any items on the stairs.  Make sure that there are handrails on both sides of the stairs and use them. Fix handrails that are broken or loose. Make sure that handrails are as long as the stairways.  Check any carpeting to make sure that  it is firmly attached to the stairs. Fix any carpet that is loose or worn.  Avoid having throw rugs at the top or bottom of the stairs. If you do have throw rugs, attach them to the floor with carpet tape.  Make sure that you have a light switch at the top of the stairs and the bottom of the stairs. If you do not have them, ask someone to add them for you. What else can I do to help prevent falls?  Wear shoes that:  Do not have high heels.  Have rubber bottoms.  Are comfortable and fit you well.  Are closed at the toe. Do not wear sandals.  If you use a stepladder:  Make sure that it is fully opened. Do not climb a closed stepladder.  Make sure that both sides of the stepladder are locked into place.  Ask someone to hold it for you, if possible.  Clearly mark and make sure that you can see:  Any grab bars or handrails.  First and last steps.  Where the edge of each step is.  Use tools that help you move around (mobility aids) if they are needed. These include:  Canes.  Walkers.  Scooters.  Crutches.  Turn on the lights when you go into a dark area. Replace any light bulbs as soon as they burn out.  Set up your furniture so you have a clear path. Avoid moving your furniture around.  If any of your floors are uneven, fix them.  If there are any pets around you, be aware of where they are.  Review your medicines with your doctor. Some medicines can make you feel dizzy. This can increase your chance of falling. Ask your doctor what other things that you can do to help prevent falls. This information is not intended to replace advice given to you by your health care provider. Make sure you discuss any questions you have with your health care provider. Document Released: 05/23/2009 Document Revised: 01/02/2016 Document Reviewed: 08/31/2014 Elsevier Interactive Patient Education  2017 [employer].

## 2019-09-25 NOTE — Progress Notes (Signed)
This visit is being conducted via phone call due to the COVID-19 pandemic. This patient has given me verbal consent via phone to conduct this visit, patient states they are participating from their home address. Some vital signs may be absent or patient reported.   Patient identification: identified by name, DOB, and current address.  Location provider: [facility], Office Persons participating in the virtual visit: Ms. Medea Gurtler and Franne Forts, LPN.    Subjective:   [name] is a 86 y.o. female who presents for Medicare Annual (Subsequent) preventive examination.  [name] states she is very concerned about her memory. She is having problems recalling names and finding appropriate word to use occasionally when talking. She states that she does not like to talk as much as she used to because she is concerned this will happen during conversation at church or in front of other people. Several times during our conversation today, she lost her thought and ability to convey her intended statement. She is interested in medication or other treatment for her memory. She is still driving but does not go far from home or where there is high traffic. She denies experiencing problems remembering how to get home or to the grocery store.   Review of Systems:  No ROS: Annual Medicare Wellness Visit today Cardiac Risk Factors include: advanced age (>42men, >56 women);hypertension;sedentary lifestyle     Objective:     Vitals: Ht 5\' 2"  (1.575 m)   Wt 166 lb (75.3 kg)   BMI 30.36 kg/m   Body mass index is 30.36 kg/m.  Advanced Directives 09/25/2019 06/30/2018 06/15/2017 06/15/2017 11/19/2014  Does Patient Have a Medical Advance Directive? Yes No Yes Yes Yes  Type of Advance Directive - - - - [place]  Does patient want to make changes to medical advance directive? No - Patient declined - - - -    Tobacco Social History   Tobacco Use  Smoking Status Former Smoker  .  Packs/day: 1.00  . Years: 7.00  . Pack years: 7.00  . Types: Cigarettes  . Quit date: 10/13/1969  . Years since quitting: 49.9  Smokeless Tobacco Never Used     Counseling given: Not Answered   Clinical Intake:  Pre-visit preparation completed: Yes  Pain : 0-10 Pain Location: Shoulder Pain Orientation: Left Pain Descriptors / Indicators: Aching Pain Frequency: Intermittent Effect of Pain on Daily Activities: worse at night     BMI - recorded: 30.36 Nutritional Status: BMI > 30  Obese Nutritional Risks: None Diabetes: No  How often do you need to have someone help you when you read instructions, pamphlets, or other written materials from your doctor or pharmacy?: 1 - Never  Interpreter Needed?: No  Information entered by :: Franne Forts, LPN.  Past Medical History:  Diagnosis Date  . Anemia   . Chronic fatigue   . Chronic renal insufficiency, stage III (moderate)   . Depression   . HYPERTENSION 04/04/2007  . HYPOTHYROIDISM 04/04/2007  . Macular degeneration    Past Surgical History:  Procedure Laterality Date  . ABDOMINAL HYSTERECTOMY    . APPENDECTOMY    . BACK SURGERY  2012  . Cataracts    . CHOLECYSTECTOMY    . ERCP N/A 11/22/2014   Procedure: ENDOSCOPIC RETROGRADE CHOLANGIOPANCREATOGRAPHY (ERCP);  Surgeon: Clarene Essex, MD;  Location: [facility] ENDOSCOPY;  Service: Endoscopy;  Laterality: N/A;  . HERNIA REPAIR    . OOPHORECTOMY    . ORIF TIBIA FRACTURE  2012  dr duda  . SPINE SURGERY  2011   cervical   Family History  Problem Relation Age of Onset  . Kidney cancer Mother 41       renal cell CA  . Heart disease Father   . Lymphoma Son   . Breast cancer Daughter    Social History   Socioeconomic History  . Marital status: Widowed    Spouse name: Not on file  . Number of children: 1  . Years of education: 9th grade  . Highest education level: 9th grade  Occupational History  . Occupation: Retired    Fish farm manager: RETIRED  Tobacco Use  . Smoking  status: Former Smoker    Packs/day: 1.00    Years: 7.00    Pack years: 7.00    Types: Cigarettes    Quit date: 10/13/1969    Years since quitting: 49.9  . Smokeless tobacco: Never Used  Substance and Sexual Activity  . Alcohol use: No  . Drug use: No  . Sexual activity: Not Currently  Other Topics Concern  . Not on file  Social History Narrative   Widowed   1 son   Social Determinants of Health   Financial Resource Strain: Low Risk   . Difficulty of Paying Living Expenses: Not very hard  Food Insecurity: No Food Insecurity  . Worried About Charity fundraiser in the Last Year: Never true  . Ran Out of Food in the Last Year: Never true  Transportation Needs: No Transportation Needs  . Lack of Transportation (Medical): No  . Lack of Transportation (Non-Medical): No  Physical Activity: Insufficiently Active  . Days of Exercise per Week: 6 days  . Minutes of Exercise per Session: 10 min  Stress: No Stress Concern Present  . Feeling of Stress : Only a little  Social Connections: Slightly Isolated  . Frequency of Communication with Friends and Family: More than three times a week  . Frequency of Social Gatherings with Friends and Family: More than three times a week  . Attends Religious Services: More than 4 times per year  . Active Member of Clubs or Organizations: Yes  . Attends Archivist Meetings: More than 4 times per year  . Marital Status: Widowed    Outpatient Encounter Medications as of 09/25/2019  Medication Sig  . amLODipine (NORVASC) 10 MG tablet Take 1 tablet (10 mg total) by mouth daily.  . cetirizine (ZYRTEC) 10 MG tablet Take 10 mg by mouth daily.  . clidinium-chlordiazePOXIDE (LIBRAX) 2.5-5 MG per capsule 2 (two) times daily as needed.   Marland Kitchen levothyroxine (SYNTHROID) 150 MCG tablet TAKE 1 TABLET(150 MCG) BY MOUTH DAILY  . Multiple Vitamins-Minerals (PRESERVISION AREDS 2 PO) Take by mouth.  . olmesartan (BENICAR) 20 MG tablet Take 1 tablet (20 mg total)  by mouth daily.  Marland Kitchen omeprazole (PRILOSEC) 40 MG capsule Take 1 capsule (40 mg total) by mouth daily.  Marland Kitchen albuterol (PROAIR HFA) 108 (90 Base) MCG/ACT inhaler 1-2 puffs every 6 hours as needed. (Patient not taking: Reported on 09/25/2019)  . montelukast (SINGULAIR) 10 MG tablet Take 1 tablet (10 mg total) by mouth at bedtime. (Patient not taking: Reported on 09/25/2019)  . Multiple Vitamin (MULTIVITAMIN) tablet Take 1 tablet by mouth daily.   No facility-administered encounter medications on file as of 09/25/2019.    Activities of Daily Living In your present state of health, do you have any difficulty performing the following activities: 09/25/2019 09/25/2019  Hearing? N N  Vision? N N  Difficulty concentrating or making decisions? Tempie Donning  Walking or climbing stairs? N N  Dressing or bathing? N N  Doing errands, shopping? N N  Preparing Food and eating ? N N  Using the Toilet? N -  In the past six months, have you accidently leaked urine? Y -  Do you have problems with loss of bowel control? N -  Managing your Medications? N N  Managing your Finances? Y N  Housekeeping or managing your Housekeeping? Y N  Comment has someone to do vaccuming and dusting -  Some recent data might be hidden    Patient Care Team: Dorothyann Peng, NP as PCP - General (Family Medicine)    Assessment:   This is a routine wellness examination for [name].  Exercise Activities and Dietary recommendations Current Exercise Habits: The patient does not participate in regular exercise at present, Exercise limited by: orthopedic condition(s)  Goals    . patient     Sleep more Try the things we talked about     . patient     Educated regarding sleep hygiene    . Patient Stated     Feel better         Fall Risk Fall Risk  09/25/2019 03/31/2019 06/30/2018 06/29/2018 01/15/2017  Falls in the past year? 0 1 0 0 Yes  Comment - - - Emmi Telephone Survey: data to providers prior to load -  Number falls in past yr: -  0 - - 1  Injury with Fall? - 0 - - Yes  Risk for fall due to : Medication side effect;Orthopedic patient History of fall(s);Impaired balance/gait;Impaired mobility - - -  Follow up Falls evaluation completed;Education provided;Falls prevention discussed Falls evaluation completed;Education provided;Falls prevention discussed - - -   Is the patient's home free of loose throw rugs in walkways, pet beds, electrical cords, etc?   yes      Grab bars in the bathroom? yes      Handrails on the stairs?   yes      Adequate lighting?   yes  Timed Get Up and Go performed: N/A due to telephone visit.  Depression Screen PHQ 2/9 Scores 09/25/2019 06/30/2018 06/15/2017 01/15/2017  PHQ - 2 Score 2 0 2 0  PHQ- 9 Score 3 - 9 -     Cognitive Function MMSE - Mini Mental State Exam 06/30/2018 06/30/2018 06/15/2017  Not completed: (No Data) (No Data) (No Data)     6CIT Screen 09/25/2019  What Year? 0 points  What month? 0 points  What time? 0 points  Count back from 20 0 points  Months in reverse 4 points  Repeat phrase 8 points  Total Score 12    Immunization History  Administered Date(s) Administered  . Fluad Quad(high Dose 65+) 03/31/2019  . Influenza Split 05/17/2012  . Influenza Whole 08/18/2007, 05/24/2009, 05/06/2010, 05/10/2012  . Influenza, High Dose Seasonal PF 06/23/2013, 07/25/2014, 07/07/2016, 06/17/2017  . Influenza-Unspecified 07/15/2015  . Pneumococcal Conjugate-13 02/11/2017  . Pneumococcal Polysaccharide-23 08/18/2007  . Tdap 09/07/2011    Qualifies for Shingles Vaccine?yes  Screening Tests Health Maintenance  Topic Date Due  . TETANUS/TDAP  09/06/2021  . INFLUENZA VACCINE  Completed  . PNA vac Low Risk Adult  Completed    Cancer Screenings: Lung: Low Dose CT Chest recommended if Age 84-80 years, 30 pack-year currently smoking OR have quit w/in 15years. Patient does not qualify. Breast:  Up to date on Mammogram? No   Up to date of Bone  Density/Dexa? No Colorectal:  yes; aged out  Additional Screenings:  Hepatitis C Screening: N/A due to age.     Plan:   Ms. Sumey does not want to continue with yearly mammograms or have a bone density due to her age which is acceptable given her advanced age. She is up to date with immunizations except for shingrix which she declines at this time. Further evaluation needed regarding memory concerns.    I have personally reviewed and noted the following in the patient's chart:   . Medical and social history . Use of alcohol, tobacco or illicit drugs  . Current medications and supplements . Functional ability and status . Nutritional status . Physical activity . Advanced directives . List of other physicians . Hospitalizations, surgeries, and ER visits in previous 12 months . Vitals . Screenings to include cognitive, depression, and falls . Referrals and appointments  In addition, I have reviewed and discussed with patient certain preventive protocols, quality metrics, and best practice recommendations. A written personalized care plan for preventive services as well as general preventive health recommendations were provided to patient.     Franne Forts, LPN  [id number]

## 2019-09-27 ENCOUNTER — Telehealth: Payer: Self-pay | Admitting: Adult Health

## 2019-09-27 NOTE — Telephone Encounter (Signed)
LMVM for the patient to contact the office to schedule an appointment with [facility].    Christy sent me the message to call patient to schedule appointment with [facility] about memory problems per Cory's request.

## 2019-09-27 NOTE — Telephone Encounter (Signed)
Called pt 2x no answer will call again shortly.

## 2019-10-02 NOTE — Telephone Encounter (Signed)
This has been taking care of.

## 2019-10-06 ENCOUNTER — Other Ambulatory Visit: Payer: Self-pay

## 2019-10-06 ENCOUNTER — Ambulatory Visit (INDEPENDENT_AMBULATORY_CARE_PROVIDER_SITE_OTHER): Payer: Medicare Other | Admitting: Adult Health

## 2019-10-06 ENCOUNTER — Encounter: Payer: Self-pay | Admitting: Adult Health

## 2019-10-06 VITALS — BP 146/62 | Temp 97.9°F | Wt 162.0 lb

## 2019-10-06 DIAGNOSIS — R4189 Other symptoms and signs involving cognitive functions and awareness: Secondary | ICD-10-CM

## 2019-10-06 LAB — CBC WITH DIFFERENTIAL/PLATELET
Basophils Absolute: 0.1 10*3/uL (ref 0.0–0.1)
Basophils Relative: 1.3 % (ref 0.0–3.0)
Eosinophils Absolute: 0.4 10*3/uL (ref 0.0–0.7)
Eosinophils Relative: 5 % (ref 0.0–5.0)
HCT: 35.9 % — ABNORMAL LOW (ref 36.0–46.0)
Hemoglobin: 11.8 g/dL — ABNORMAL LOW (ref 12.0–15.0)
Lymphocytes Relative: 22.2 % (ref 12.0–46.0)
Lymphs Abs: 1.7 10*3/uL (ref 0.7–4.0)
MCHC: 33 g/dL (ref 30.0–36.0)
MCV: 91.3 fl (ref 78.0–100.0)
Monocytes Absolute: 0.6 10*3/uL (ref 0.1–1.0)
Monocytes Relative: 8.2 % (ref 3.0–12.0)
Neutro Abs: 4.9 10*3/uL (ref 1.4–7.7)
Neutrophils Relative %: 63.3 % (ref 43.0–77.0)
Platelets: 305 10*3/uL (ref 150.0–400.0)
RBC: 3.93 Mil/uL (ref 3.87–5.11)
RDW: 14.4 % (ref 11.5–15.5)
WBC: 7.7 10*3/uL (ref 4.0–10.5)

## 2019-10-06 LAB — VITAMIN D 25 HYDROXY (VIT D DEFICIENCY, FRACTURES): VITD: 33.36 ng/mL (ref 30.00–100.00)

## 2019-10-06 LAB — VITAMIN B12: Vitamin B-12: 426 pg/mL (ref 211–911)

## 2019-10-06 LAB — TSH: TSH: 1.42 u[IU]/mL (ref 0.35–4.50)

## 2019-10-06 NOTE — Progress Notes (Signed)
Subjective:    Patient ID: [name], female    DOB: [birth year], 86 y.o.   MRN: [id number]  HPI 86 year old female who  has a past medical history of Anemia, Chronic fatigue, Chronic renal insufficiency, stage III (moderate), Depression, HYPERTENSION (04/04/2007), HYPOTHYROIDISM (04/04/2007), and Macular degeneration.   Her son is with hr today to help provide histroy  She presents to the office today for concern of cognitive impairment.  She reports that she is having problems recalling names and finding appropriate words to use occasionally when talking.  He does not like to talk as much as she is used to because she is concerned that this will happen during conversation at church or in front of other people.  He was recently seen by the RN her Medicare wellness exam and it was noted that during that conversation she had lost thought and ability to convey her intended statement multiple times.  She continues to drive but does not go far from home or areas of high traffic.  She denies getting lost while driving misplacing her keys or her purse.  Her son reports that he has also noticed that she will often have word finding issues that she will ask the same question multiple times in a short time span.    Review of Systems See HPI  Past Medical History:  Diagnosis Date  . Anemia   . Chronic fatigue   . Chronic renal insufficiency, stage III (moderate)   . Depression   . HYPERTENSION 04/04/2007  . HYPOTHYROIDISM 04/04/2007  . Macular degeneration     Social History   Socioeconomic History  . Marital status: Widowed    Spouse name: Not on file  . Number of children: 1  . Years of education: 9th grade  . Highest education level: 9th grade  Occupational History  . Occupation: Retired    Fish farm manager: RETIRED  Tobacco Use  . Smoking status: Former Smoker    Packs/day: 1.00    Years: 7.00    Pack years: 7.00    Types: Cigarettes    Quit date: 10/13/1969    Years since quitting:  50.0  . Smokeless tobacco: Never Used  Substance and Sexual Activity  . Alcohol use: No  . Drug use: No  . Sexual activity: Not Currently  Other Topics Concern  . Not on file  Social History Narrative   Widowed   1 son   Social Determinants of Health   Financial Resource Strain: Low Risk   . Difficulty of Paying Living Expenses: Not very hard  Food Insecurity: No Food Insecurity  . Worried About Charity fundraiser in the Last Year: Never true  . Ran Out of Food in the Last Year: Never true  Transportation Needs: No Transportation Needs  . Lack of Transportation (Medical): No  . Lack of Transportation (Non-Medical): No  Physical Activity: Insufficiently Active  . Days of Exercise per Week: 6 days  . Minutes of Exercise per Session: 10 min  Stress: No Stress Concern Present  . Feeling of Stress : Only a little  Social Connections: Slightly Isolated  . Frequency of Communication with Friends and Family: More than three times a week  . Frequency of Social Gatherings with Friends and Family: More than three times a week  . Attends Religious Services: More than 4 times per year  . Active Member of Clubs or Organizations: Yes  . Attends Archivist Meetings: More than 4 times per year  .  Marital Status: Widowed  Intimate Partner Violence:   . Fear of Current or Ex-Partner: Not on file  . Emotionally Abused: Not on file  . Physically Abused: Not on file  . Sexually Abused: Not on file    Past Surgical History:  Procedure Laterality Date  . ABDOMINAL HYSTERECTOMY    . APPENDECTOMY    . BACK SURGERY  2012  . Cataracts    . CHOLECYSTECTOMY    . ERCP N/A 11/22/2014   Procedure: ENDOSCOPIC RETROGRADE CHOLANGIOPANCREATOGRAPHY (ERCP);  Surgeon: Clarene Essex, MD;  Location: [facility] ENDOSCOPY;  Service: Endoscopy;  Laterality: N/A;  . HERNIA REPAIR    . OOPHORECTOMY    . ORIF TIBIA FRACTURE  2012   dr duda  . SPINE SURGERY  2011   cervical    Family History  Problem  Relation Age of Onset  . Kidney cancer Mother 66       renal cell CA  . Heart disease Father   . Lymphoma Son   . Breast cancer Daughter     Allergies  Allergen Reactions  . Oxycodone Hcl     REACTION: itching    Current Outpatient Medications on File Prior to Visit  Medication Sig Dispense Refill  . albuterol (PROAIR HFA) 108 (90 Base) MCG/ACT inhaler 1-2 puffs every 6 hours as needed. 8 g 2  . amLODipine (NORVASC) 10 MG tablet Take 1 tablet (10 mg total) by mouth daily. 90 tablet 3  . cetirizine (ZYRTEC) 10 MG tablet Take 10 mg by mouth daily.    . clidinium-chlordiazePOXIDE (LIBRAX) 2.5-5 MG per capsule 2 (two) times daily as needed.     Marland Kitchen levothyroxine (SYNTHROID) 150 MCG tablet TAKE 1 TABLET(150 MCG) BY MOUTH DAILY 30 tablet 0  . montelukast (SINGULAIR) 10 MG tablet Take 1 tablet (10 mg total) by mouth at bedtime. 90 tablet 3  . Multiple Vitamin (MULTIVITAMIN) tablet Take 1 tablet by mouth daily.    . Multiple Vitamins-Minerals (PRESERVISION AREDS 2 PO) Take by mouth.    . olmesartan (BENICAR) 20 MG tablet Take 1 tablet (20 mg total) by mouth daily. 90 tablet 3  . omeprazole (PRILOSEC) 40 MG capsule Take 1 capsule (40 mg total) by mouth daily. 90 capsule 3   No current facility-administered medications on file prior to visit.    BP (!) 146/62   Temp 97.9 F (36.6 C)   Wt 162 lb (73.5 kg)   BMI 29.63 kg/m       Objective:   Physical Exam Vitals and nursing note reviewed.  Constitutional:      Appearance: Normal appearance.  Cardiovascular:     Rate and Rhythm: Normal rate and regular rhythm.     Pulses: Normal pulses.     Heart sounds: Normal heart sounds.  Pulmonary:     Effort: Pulmonary effort is normal.     Breath sounds: Normal breath sounds.  Musculoskeletal:        General: Normal range of motion.  Skin:    General: Skin is warm and dry.     Capillary Refill: Capillary refill takes less than 2 seconds.  Neurological:     General: No focal deficit  present.     Mental Status: She is alert and oriented to person, place, and time.  Psychiatric:        Mood and Affect: Mood normal. Affect is tearful.        Behavior: Behavior normal. Behavior is cooperative.        Thought  Content: Thought content normal.        Cognition and Memory: Cognition and memory normal. Cognition is not impaired. Memory is not impaired.        Judgment: Judgment normal.     Comments: Word finding issues noted throughout the exam today.  She also became tearful at times due to concern that she may have Alzheimer's dementia.   conversation she had lost thought and ability to convey her intended statement multiple times.          Assessment & Plan:  1. Cognitive impairment MMSE Exam - 80/30  - Likely age related cognitive decline. She would like to have MRI done to r/o other causes.  - Advised brain games and to stay active  - Consider Aricept in the future.  - CBC with Differential/Platelet - TSH - Vitamin B12 - Vitamin D, 25-hydroxy - MR Brain Wo Contrast; Future  Dorothyann Peng, NP

## 2019-10-11 ENCOUNTER — Encounter (INDEPENDENT_AMBULATORY_CARE_PROVIDER_SITE_OTHER): Payer: Medicare Other | Admitting: Ophthalmology

## 2019-10-11 ENCOUNTER — Telehealth: Payer: Self-pay | Admitting: Adult Health

## 2019-10-11 NOTE — Telephone Encounter (Signed)
Spoke to [employer].  He wanted to know if the patient should come in for an appointment.  Advised the pt does not need an appointment tomorrow.  Nothing further needed.

## 2019-10-11 NOTE — Telephone Encounter (Signed)
Pt son Lakitha Stromme would like a cal back about pt canceled appt and also has another question.

## 2019-10-12 ENCOUNTER — Telehealth: Payer: Medicare Other | Admitting: Adult Health

## 2019-10-26 ENCOUNTER — Encounter (INDEPENDENT_AMBULATORY_CARE_PROVIDER_SITE_OTHER): Payer: Medicare Other | Admitting: Ophthalmology

## 2019-10-26 DIAGNOSIS — H348122 Central retinal vein occlusion, left eye, stable: Secondary | ICD-10-CM

## 2019-10-26 DIAGNOSIS — H35033 Hypertensive retinopathy, bilateral: Secondary | ICD-10-CM | POA: Diagnosis not present

## 2019-10-26 DIAGNOSIS — H353132 Nonexudative age-related macular degeneration, bilateral, intermediate dry stage: Secondary | ICD-10-CM | POA: Diagnosis not present

## 2019-10-26 DIAGNOSIS — D3132 Benign neoplasm of left choroid: Secondary | ICD-10-CM

## 2019-10-26 DIAGNOSIS — I10 Essential (primary) hypertension: Secondary | ICD-10-CM

## 2019-10-26 DIAGNOSIS — H43813 Vitreous degeneration, bilateral: Secondary | ICD-10-CM | POA: Diagnosis not present

## 2019-11-20 ENCOUNTER — Other Ambulatory Visit: Payer: Self-pay | Admitting: Adult Health

## 2019-11-21 NOTE — Telephone Encounter (Signed)
Sent to the pharmacy by e-scribe. 

## 2020-02-02 ENCOUNTER — Telehealth: Payer: Self-pay | Admitting: Adult Health

## 2020-02-02 NOTE — Telephone Encounter (Signed)
Pts son [name]) is calling in stating that the pt is having a lot of extra issues with her memory.  He would like to have the MRI done of her brain. Pts son would like to have a call back so that he can elaborate more on what he is saying.

## 2020-02-06 NOTE — Telephone Encounter (Signed)
Pts son was called and pt has been schedule for Tuesday 02/13/2020 at 2:00 p.m.

## 2020-02-06 NOTE — Telephone Encounter (Signed)
Will need visit

## 2020-02-06 NOTE — Telephone Encounter (Signed)
I am unable to schedule

## 2020-02-13 ENCOUNTER — Ambulatory Visit (INDEPENDENT_AMBULATORY_CARE_PROVIDER_SITE_OTHER): Payer: Medicare Other | Admitting: Adult Health

## 2020-02-13 ENCOUNTER — Other Ambulatory Visit: Payer: Self-pay

## 2020-02-13 ENCOUNTER — Encounter: Payer: Self-pay | Admitting: Adult Health

## 2020-02-13 VITALS — BP 150/60 | Temp 98.5°F | Wt 157.0 lb

## 2020-02-13 DIAGNOSIS — R443 Hallucinations, unspecified: Secondary | ICD-10-CM

## 2020-02-13 DIAGNOSIS — R4189 Other symptoms and signs involving cognitive functions and awareness: Secondary | ICD-10-CM

## 2020-02-13 LAB — URINALYSIS, ROUTINE W REFLEX MICROSCOPIC
Bilirubin Urine: NEGATIVE
Hgb urine dipstick: NEGATIVE
Ketones, ur: NEGATIVE
Nitrite: NEGATIVE
RBC / HPF: NONE SEEN (ref 0–?)
Specific Gravity, Urine: 1.01 (ref 1.000–1.030)
Total Protein, Urine: NEGATIVE
Urine Glucose: NEGATIVE
Urobilinogen, UA: 0.2 (ref 0.0–1.0)
pH: 6 (ref 5.0–8.0)

## 2020-02-13 NOTE — Progress Notes (Signed)
Subjective:    Patient ID: [name], female    DOB: Oct 19, 1928, 86 y.o.   MRN: [id number]  HPI 86 year old female who  has a past medical history of Anemia, Chronic fatigue, Chronic renal insufficiency, stage III (moderate), Depression, HYPERTENSION (04/04/2007), HYPOTHYROIDISM (04/04/2007), and Macular degeneration.  She presents to the office today for follow up regarding cognitive impairment.  She was seen for this issue in February 2021.  She reported at this time having problems recalling names and finding appropriate words to use when talking.  She reported that she did not like to talk as much as in the past because she was concerned what will happen during the conversation in regards to word finding difficulties.  She denied getting lost while driving, misplacing her keys, her purse.  Her son that was with her during this visit reported that he had noticed word finding issues and also that she would ask him the same question multiple times in a short time span.  At this visit her MMSE was 27 out of 30 and an MRI of the brain was ordered but she never went through with having the MRI done.  Today she presents to the office today with her son.  Her son reports that her cognitive issues have not improved and that seemed to be getting worse.  The patient reports that over the last 2 to 3 months she has been experiencing visual hallucinations that have been at night.  She is seeing people that she does not know in her house.  She initially denied auditory hallucinations but then stated "they told me that they are half angels and half humans".  She originally senses them outside of her house and then they come inside. These hallucinations are not causing fear and the patient she states "I does go to bed and when I wake up they are gone".   Review of Systems See HPI   Past Medical History:  Diagnosis Date  . Anemia   . Chronic fatigue   . Chronic renal insufficiency, stage III (moderate)    . Depression   . HYPERTENSION 04/04/2007  . HYPOTHYROIDISM 04/04/2007  . Macular degeneration     Social History   Socioeconomic History  . Marital status: Widowed    Spouse name: Not on file  . Number of children: 1  . Years of education: 9th grade  . Highest education level: 9th grade  Occupational History  . Occupation: Retired    Fish farm manager: RETIRED  Tobacco Use  . Smoking status: Former Smoker    Packs/day: 1.00    Years: 7.00    Pack years: 7.00    Types: Cigarettes    Quit date: 10/13/1969    Years since quitting: 50.3  . Smokeless tobacco: Never Used  Substance and Sexual Activity  . Alcohol use: No  . Drug use: No  . Sexual activity: Not Currently  Other Topics Concern  . Not on file  Social History Narrative   Widowed   1 son   Social Determinants of Health   Financial Resource Strain: Low Risk   . Difficulty of Paying Living Expenses: Not very hard  Food Insecurity: No Food Insecurity  . Worried About Charity fundraiser in the Last Year: Never true  . Ran Out of Food in the Last Year: Never true  Transportation Needs: No Transportation Needs  . Lack of Transportation (Medical): No  . Lack of Transportation (Non-Medical): No  Physical Activity: Insufficiently  Active  . Days of Exercise per Week: 6 days  . Minutes of Exercise per Session: 10 min  Stress: No Stress Concern Present  . Feeling of Stress : Only a little  Social Connections: Moderately Integrated  . Frequency of Communication with Friends and Family: More than three times a week  . Frequency of Social Gatherings with Friends and Family: More than three times a week  . Attends Religious Services: More than 4 times per year  . Active Member of Clubs or Organizations: Yes  . Attends Archivist Meetings: More than 4 times per year  . Marital Status: Widowed  Intimate Partner Violence:   . Fear of Current or Ex-Partner:   . Emotionally Abused:   Marland Kitchen Physically Abused:   . Sexually  Abused:     Past Surgical History:  Procedure Laterality Date  . ABDOMINAL HYSTERECTOMY    . APPENDECTOMY    . BACK SURGERY  2012  . Cataracts    . CHOLECYSTECTOMY    . ERCP N/A 11/22/2014   Procedure: ENDOSCOPIC RETROGRADE CHOLANGIOPANCREATOGRAPHY (ERCP);  Surgeon: Clarene Essex, MD;  Location[facility] ENDOSCOPY;  Service: Endoscopy;  Laterality: N/A;  . HERNIA REPAIR    . OOPHORECTOMY    . ORIF TIBIA FRACTURE  2012   dr duda  . SPINE SURGERY  2011   cervical    Family History  Problem Relation Age of Onset  . Kidney cancer Mother 76       renal cell CA  . Heart disease Father   . Lymphoma Son   . Breast cancer Daughter     Allergies  Allergen Reactions  . Oxycodone Hcl     REACTION: itching    Current Outpatient Medications on File Prior to Visit  Medication Sig Dispense Refill  . albuterol (PROAIR HFA) 108 (90 Base) MCG/ACT inhaler 1-2 puffs every 6 hours as needed. 8 g 2  . amLODipine (NORVASC) 10 MG tablet Take 1 tablet (10 mg total) by mouth daily. 90 tablet 3  . cetirizine (ZYRTEC) 10 MG tablet Take 10 mg by mouth daily.    . clidinium-chlordiazePOXIDE (LIBRAX) 2.5-5 MG per capsule 2 (two) times daily as needed.     Marland Kitchen levothyroxine (SYNTHROID) 150 MCG tablet TAKE 1 TABLET BY MOUTH  DAILY 90 tablet 1  . montelukast (SINGULAIR) 10 MG tablet Take 1 tablet (10 mg total) by mouth at bedtime. 90 tablet 3  . Multiple Vitamin (MULTIVITAMIN) tablet Take 1 tablet by mouth daily.    . Multiple Vitamins-Minerals (PRESERVISION AREDS 2 PO) Take by mouth.    . olmesartan (BENICAR) 20 MG tablet Take 1 tablet (20 mg total) by mouth daily. 90 tablet 3  . omeprazole (PRILOSEC) 40 MG capsule Take 1 capsule (40 mg total) by mouth daily. 90 capsule 3   No current facility-administered medications on file prior to visit.    There were no vitals taken for this visit.      Objective:   Physical Exam Vitals and nursing note reviewed.  Constitutional:      Appearance: Normal appearance.   Cardiovascular:     Rate and Rhythm: Normal rate and regular rhythm.     Pulses: Normal pulses.     Heart sounds: Normal heart sounds.  Pulmonary:     Effort: Pulmonary effort is normal.     Breath sounds: Normal breath sounds.  Musculoskeletal:        General: Normal range of motion.  Skin:    General: Skin  is warm and dry.     Capillary Refill: Capillary refill takes less than 2 seconds.  Neurological:     General: No focal deficit present.     Mental Status: She is alert and oriented to person, place, and time.  Psychiatric:        Mood and Affect: Mood normal.        Behavior: Behavior normal.        Judgment: Judgment normal.       Assessment & Plan:  1. Hallucination -We will check a urinalysis and order MRI of the brain.  Can consider medication such as Seroquel in the future - MR Brain Wo Contrast; Future - Ambulatory referral to Neurology - Urinalysis  2. Cognitive impairment  - MR Brain Wo Contrast; Future - Ambulatory referral to Neurology - Urinalysis   Dorothyann Peng, NP   Time spent on chart review, time with patient; discussion of cognitive impairment  treatment, follow up plan, and documentation 30 minutes

## 2020-02-14 ENCOUNTER — Encounter: Payer: Self-pay | Admitting: Neurology

## 2020-02-16 ENCOUNTER — Encounter: Payer: Self-pay | Admitting: Family Medicine

## 2020-03-10 ENCOUNTER — Ambulatory Visit
Admission: RE | Admit: 2020-03-10 | Discharge: 2020-03-10 | Disposition: A | Discharge status: Home / Self Care | Payer: Medicare Other | Source: Ambulatory Visit | Attending: Adult Health | Admitting: Adult Health

## 2020-03-10 DIAGNOSIS — G3184 Mild cognitive impairment, so stated: Secondary | ICD-10-CM | POA: Diagnosis not present

## 2020-03-10 DIAGNOSIS — G9389 Other specified disorders of brain: Secondary | ICD-10-CM | POA: Diagnosis not present

## 2020-03-10 DIAGNOSIS — R9082 White matter disease, unspecified: Secondary | ICD-10-CM | POA: Diagnosis not present

## 2020-03-10 DIAGNOSIS — R443 Hallucinations, unspecified: Secondary | ICD-10-CM | POA: Diagnosis not present

## 2020-03-10 DIAGNOSIS — R4189 Other symptoms and signs involving cognitive functions and awareness: Secondary | ICD-10-CM

## 2020-03-13 ENCOUNTER — Other Ambulatory Visit: Payer: Self-pay | Admitting: Adult Health

## 2020-03-13 ENCOUNTER — Telehealth: Payer: Self-pay | Admitting: Adult Health

## 2020-03-13 DIAGNOSIS — I10 Essential (primary) hypertension: Secondary | ICD-10-CM

## 2020-03-13 MED ORDER — QUETIAPINE FUMARATE 25 MG PO TABS: 25.0000 mg | ORAL_TABLET | Freq: Every day | ORAL | 0 refills | Status: DC

## 2020-03-13 NOTE — Telephone Encounter (Signed)
Spoke to the patient's son, [name] and informed him of her cells MRI of the brain which was consistent with age-related volume loss.  She does have an appointment with neurology in October  [name] reports that her hallucinations are getting worse, she is seeing people in the house more consistently, sees birds flying around, and believes that somebody is having sex with her while she sleeps.  Will start her on low dose Seroquel and [name] was advised to follow up if no improvement over the next 1-2 weeks

## 2020-03-29 NOTE — Telephone Encounter (Signed)
[name] stated that his mother said the medication Seroquel is messing with her mind and was going to stopped taking it but they read the letter about the med that advise to not stop without her PCP permission. She would like to speak to [facility] regarding the medication.  Offered a virtual appt but both pt and son declined.  [name] said to reach them on his cell [phone] at your earliest convenience.

## 2020-03-29 NOTE — Telephone Encounter (Signed)
She can stop it, she has not been taking it that long. Can try using Aricept 10 mg QHS or they can see what neurology wants to do when they see them

## 2020-03-29 NOTE — Telephone Encounter (Signed)
Please advise 

## 2020-04-02 NOTE — Telephone Encounter (Signed)
Spoke with [name] and she will continue Seroquel until her neurology  appointment.

## 2020-04-10 ENCOUNTER — Other Ambulatory Visit: Payer: Self-pay | Admitting: Adult Health

## 2020-04-10 NOTE — Telephone Encounter (Signed)
Okay for refill?  

## 2020-05-09 ENCOUNTER — Other Ambulatory Visit: Payer: Self-pay

## 2020-05-09 ENCOUNTER — Encounter: Payer: Self-pay | Admitting: Neurology

## 2020-05-09 ENCOUNTER — Ambulatory Visit (INDEPENDENT_AMBULATORY_CARE_PROVIDER_SITE_OTHER): Payer: Medicare Other | Admitting: Neurology

## 2020-05-09 VITALS — BP 177/66 | HR 69 | Ht 62.0 in | Wt 155.0 lb

## 2020-05-09 DIAGNOSIS — G3183 Dementia with Lewy bodies: Secondary | ICD-10-CM

## 2020-05-09 DIAGNOSIS — F0281 Dementia in other diseases classified elsewhere with behavioral disturbance: Secondary | ICD-10-CM | POA: Diagnosis not present

## 2020-05-09 DIAGNOSIS — F02818 Dementia in other diseases classified elsewhere, unspecified severity, with other behavioral disturbance: Secondary | ICD-10-CM

## 2020-05-09 MED ORDER — DONEPEZIL HCL 10 MG PO TABS: ORAL_TABLET | ORAL | 3 refills | Status: DC

## 2020-05-09 MED ORDER — QUETIAPINE FUMARATE 25 MG PO TABS: ORAL_TABLET | ORAL | 3 refills | Status: DC
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
## 2020-05-09 NOTE — Patient Instructions (Signed)
1. Start Donepezil (Aricept) 10mg : take 1/2 tablet daily for 2 weeks, then increase to 1 tablet daily  2. Increase Quetiapine 25mg : Take 1/2 tablet every morning, 1 tablet every evening  3. Recommend having a companion stay with you at night  4. Follow-up in 4 months, call for any changes   FALL PRECAUTIONS: Be cautious when walking. Scan the area for obstacles that may increase the risk of trips and falls. When getting up in the mornings, sit up at the edge of the bed for a few minutes before getting out of bed. Consider elevating the bed at the head end to avoid drop of blood pressure when getting up. Walk always in a well-lit room (use night lights in the walls). Avoid area rugs or power cords from appliances in the middle of the walkways. Use a walker or a cane if necessary and consider physical therapy for balance exercise. Get your eyesight checked regularly.  FINANCIAL OVERSIGHT: Supervision, especially oversight when making financial decisions or transactions is also recommended.  HOME SAFETY: Consider the safety of the kitchen when operating appliances like stoves, microwave oven, and blender. Consider having supervision and share cooking responsibilities until no longer able to participate in those. Accidents with firearms and other hazards in the house should be identified and addressed as well.  DRIVING: Regarding driving, in patients with progressive memory problems, driving will be impaired. We advise to have someone else do the driving if trouble finding directions or if minor accidents are reported. Independent driving assessment is available to determine safety of driving.  ABILITY TO BE LEFT ALONE: If patient is unable to contact 911 operator, consider using LifeLine, or when the need is there, arrange for someone to stay with patients. Smoking is a fire hazard, consider supervision or cessation. Risk of wandering should be assessed by caregiver and if detected at any point,  supervision and safe proof recommendations should be instituted.  MEDICATION SUPERVISION: Inability to self-administer medication needs to be constantly addressed. Implement a mechanism to ensure safe administration of the medications.  RECOMMENDATIONS FOR ALL PATIENTS WITH MEMORY PROBLEMS: 1. Continue to exercise (Recommend 30 minutes of walking everyday, or 3 hours every week) 2. Increase social interactions - continue going to [place] and enjoy social gatherings with friends and family 3. Eat healthy, avoid fried foods and eat more fruits and vegetables 4. Maintain adequate blood pressure, blood sugar, and blood cholesterol level. Reducing the risk of stroke and cardiovascular disease also helps promoting better memory. 5. Avoid stressful situations. Live a simple life and avoid aggravations. Organize your time and prepare for the next day in anticipation. 6. Sleep well, avoid any interruptions of sleep and avoid any distractions in the bedroom that may interfere with adequate sleep quality 7. Avoid sugar, avoid sweets as there is a strong link between excessive sugar intake, diabetes, and cognitive impairment The Mediterranean diet, has been shown to help patients reduce the risk of progressive memory disorders and reduces cardiovascular risk. This includes eating fish, eat fruits and green leafy vegetables, nuts like almonds and hazelnuts, walnuts, and also use olive oil. Avoid fast foods and fried foods as much as possible. Avoid sweets and sugar as sugar use has been linked to worsening of memory function.  There is always a concern of gradual progression of memory problems. If this is the case, then we may need to adjust level of care according to patient needs. Support, both to the patient and caregiver, should then be put into place.

## 2020-05-16 ENCOUNTER — Telehealth: Payer: Self-pay | Admitting: *Deleted

## 2020-05-16 ENCOUNTER — Telehealth: Payer: Self-pay | Admitting: Adult Health

## 2020-05-16 NOTE — Telephone Encounter (Signed)
-----   Message from Dorothyann Peng, NP sent at 05/16/2020  2:49 PM EDT -----   ----- Message ----- From: Cameron Sprang, MD Sent: 05/10/2020  11:33 AM EDT To: Dorothyann Peng, NP  [name], she did not do so well with memory testing so Lewy body dementia is a possibility, however with symptoms seeming to start more since daughter passed, depression with psychotic features is also possible. Please consider starting a different antidepressant. She is not taking the Lexapro. I started Aricept and increased Seroquel. Thanks

## 2020-05-16 NOTE — Telephone Encounter (Signed)
Patient's son [name] called back to discuss Escitalipram.  He is requesting a call back.

## 2020-05-16 NOTE — Telephone Encounter (Signed)
-----   Message from Dorothyann Peng, NP sent at 05/16/2020  2:49 PM EDT -----   ----- Message ----- From: Cameron Sprang, MD Sent: 05/10/2020  11:33 AM EDT To: Dorothyann Peng, NP  Maryruth Eve, she did not do so well with memory testing so Lewy body dementia is a possibility, however with symptoms seeming to start more since daughter passed, depression with psychotic features is also possible. Please consider starting a different antidepressant. She is not taking the Lexapro. I started Aricept and increased Seroquel. Thanks

## 2020-05-16 NOTE — Telephone Encounter (Signed)
Patients son called back and stated he was not aware that the pt was to be taking Lexapro and went through all of the names of her medications.  Her son stated he just found the bottle of medication up in the pts cabinet and the pt was in the background stating "I don't need another medication".  Message sent to PCP.

## 2020-05-16 NOTE — Telephone Encounter (Signed)
Left a message for the pts son to return my call.

## 2020-05-16 NOTE — Progress Notes (Deleted)
Can we call her son.....   I received a message from with the neurologist who asked me to start her on another antidepressant as she is not taking the Lexapro.  It sounds like her neurologist is concerned about depression.  I can send in Zoloft 50 mg to take nightly instead of Lexapro.

## 2020-05-21 ENCOUNTER — Telehealth: Payer: Self-pay | Admitting: Adult Health

## 2020-05-21 ENCOUNTER — Ambulatory Visit: Payer: Medicare Other | Admitting: Neurology

## 2020-05-21 MED ORDER — SERTRALINE HCL 25 MG PO TABS: 25.0000 mg | ORAL_TABLET | Freq: Every day | ORAL | 0 refills | Status: DC

## 2020-05-21 NOTE — Telephone Encounter (Signed)
Spoke to the patient's son and informed him of recommendations by neurology to start on a different antidepressant since she is not taking Celexa.  Patient's son is okay with this, he will keep me informed how it is tolerated  Will start on Zoloft 25 mg QHS

## 2020-05-24 ENCOUNTER — Telehealth: Payer: Self-pay | Admitting: Adult Health

## 2020-05-24 NOTE — Telephone Encounter (Signed)
Pts son is calling in stating that the new Rx sertraline (ZOLOFT) 25 MG  Pharm:  [facility] on [address] and cancel the one that was sent to [facility] please.

## 2020-05-27 MED ORDER — SERTRALINE HCL 25 MG PO TABS: 25.0000 mg | ORAL_TABLET | Freq: Every day | ORAL | 0 refills | Status: DC

## 2020-05-29 NOTE — Telephone Encounter (Signed)
Son called back and they did receive the rx from [facility].   They would like to keep [facility] mail order.

## 2020-05-29 NOTE — Telephone Encounter (Addendum)
Called [facility] and they stated that the rx has already been shipped.  PharmD stated that the medication should have already been delivered.  Pt son [name] called and lvm for [name] to call back -   1. wanted to confirm if they have received the prescription via mail.  2. Do they want [facility] removed as pharmacy from pharmacy list.

## 2020-05-30 ENCOUNTER — Telehealth: Payer: Self-pay | Admitting: Neurology

## 2020-05-30 NOTE — Telephone Encounter (Signed)
Patient's family member called in and left a message stating the patient has been having cramping in her legs and feet. She thinks it's from her medication. He stated he has had a hard time getting her to drink more fluids.

## 2020-05-31 NOTE — Telephone Encounter (Signed)
Spoke to [name] they are going back to 1/2 tab Donepezil and see if leg cramps improve, will try to keep her hydrated as best he can.

## 2020-05-31 NOTE — Telephone Encounter (Signed)
Pls confirm if she is taking 1/2 tab or increased to 1 tab. Can try going back to 1/2 tab Donepezil and see if leg cramps improve, would try to keep hydrated as best he can. Thanks

## 2020-06-03 ENCOUNTER — Telehealth: Payer: Self-pay | Admitting: Adult Health

## 2020-06-03 NOTE — Telephone Encounter (Signed)
Pts son is calling in stating that the pt refuse to take any more of the Rx donepezil (ARICEPT) 10 MG and they would like to know what they should do. Margorie John would like to have a call back.

## 2020-06-06 NOTE — Telephone Encounter (Signed)
Left a message for the pt's son to return my call.  

## 2020-06-06 NOTE — Telephone Encounter (Signed)
I would encourage her to take it as Dr. Delice Lesch recently advised of 1/2 tab and drink plenty of water.

## 2020-06-06 NOTE — Telephone Encounter (Signed)
Mr Norment called back and was informed of the message below.  He stated the pt states she feels lethargic and worse when taking the medication.  I advised he contact Dr Aquino's office to let her know how the pt is feeling as Aricept was prescribed by their office and he agreed.

## 2020-07-01 ENCOUNTER — Telehealth: Payer: Self-pay | Admitting: Neurology

## 2020-07-01 ENCOUNTER — Telehealth: Payer: Self-pay | Admitting: Adult Health

## 2020-07-01 ENCOUNTER — Other Ambulatory Visit: Payer: Self-pay

## 2020-07-01 MED ORDER — DONEPEZIL HCL 10 MG PO TABS: ORAL_TABLET | ORAL | 0 refills | Status: DC

## 2020-07-01 MED ORDER — QUETIAPINE FUMARATE 25 MG PO TABS: ORAL_TABLET | ORAL | 0 refills | Status: DC

## 2020-07-01 NOTE — Telephone Encounter (Signed)
Patient's son called and said the patient will need new prescriptions for quetiapine 25 MG and donepezil 10 MG sent to the mail order pharmacy below.  [facility]

## 2020-07-01 NOTE — Telephone Encounter (Signed)
Pts son is calling in stating that they would like to have QUEtiapine (SEROQUEL) 25 MG   Pharm: [facility]

## 2020-07-01 NOTE — Telephone Encounter (Signed)
This has been sent in by the prescribing provider.

## 2020-07-01 NOTE — Telephone Encounter (Signed)
Sent to mail pharmacy

## 2020-07-10 ENCOUNTER — Telehealth: Payer: Self-pay | Admitting: Adult Health

## 2020-07-10 NOTE — Telephone Encounter (Signed)
Patient son is calling and stated that pt is taking Seroquel from another provider,  another medication for memory and Zoloft. Pt son wanted to see if Zoloft was still needed for sleep, please advise. CB is [phone]

## 2020-07-10 NOTE — Telephone Encounter (Signed)
Spoke with son and he is aware that [name] is away from the office this week.

## 2020-07-11 ENCOUNTER — Other Ambulatory Visit: Payer: Self-pay | Admitting: Neurology

## 2020-07-15 ENCOUNTER — Telehealth: Payer: Self-pay | Admitting: Neurology

## 2020-07-15 NOTE — Telephone Encounter (Signed)
Patient's son called in stating he got an email from what he thought was [place] asking to go over the "8 medications" the patient was taking. Can someone give him a call to go over the medications with him?

## 2020-07-16 NOTE — Telephone Encounter (Signed)
Spoke with pt son he is unsure about who the E-mail came from he thinks maybe it was from [place] he stated that he is not worried about it, but he would like to know if her medication would cause her to void more and also would it cause joint pain?

## 2020-07-16 NOTE — Telephone Encounter (Signed)
Dr. Delice Lesch doesn't have pt on 8 meds but the meds she has patient on would not cause increased urination or joint pain, but she should f/u with PCP to make sure no UTI or increased blood sugar issues, etc causing her problems

## 2020-07-16 NOTE — Telephone Encounter (Signed)
I would have her continue with the Zoloft

## 2020-07-16 NOTE — Telephone Encounter (Signed)
Spoke with pt son informed him that another MR Dr Tat looked at our phone note and that Dr. Delice Lesch doesn't have pt on 8 meds but the meds she has patient on would not cause increased urination or joint pain, but she should f/u with PCP to make sure no UTI or increased blood sugar issues, etc causing her problems. Pt son verbalized understanding and stated that he would call PCP

## 2020-07-17 NOTE — Telephone Encounter (Signed)
Patient is currently taking Aricept, Seroquel, and Zoloft.  Son feels as though it's too much medication.  Can she safely stop the Zoloft?

## 2020-07-17 NOTE — Telephone Encounter (Signed)
I cannot force her to take anything but I would imagine it is only helping. If she wants to stop it then do a slow taper  Week one - skip one day between doses Week two - skip two days between doses Week three - skip three days between doses   And so on until she is no longer taking the  medication

## 2020-07-17 NOTE — Telephone Encounter (Signed)
Son is aware.

## 2020-07-27 ENCOUNTER — Other Ambulatory Visit: Payer: Self-pay | Admitting: Adult Health

## 2020-08-07 ENCOUNTER — Other Ambulatory Visit: Payer: Self-pay | Admitting: Neurology

## 2020-08-07 ENCOUNTER — Encounter: Payer: Self-pay | Admitting: Adult Health

## 2020-08-07 ENCOUNTER — Ambulatory Visit (INDEPENDENT_AMBULATORY_CARE_PROVIDER_SITE_OTHER): Payer: Medicare Other

## 2020-08-07 ENCOUNTER — Ambulatory Visit (INDEPENDENT_AMBULATORY_CARE_PROVIDER_SITE_OTHER): Payer: Medicare Other | Admitting: Adult Health

## 2020-08-07 ENCOUNTER — Other Ambulatory Visit: Payer: Self-pay

## 2020-08-07 VITALS — BP 152/68 | HR 58 | Temp 98.8°F | Ht 62.0 in | Wt 161.6 lb

## 2020-08-07 DIAGNOSIS — R350 Frequency of micturition: Secondary | ICD-10-CM | POA: Diagnosis not present

## 2020-08-07 DIAGNOSIS — Z961 Presence of intraocular lens: Secondary | ICD-10-CM | POA: Diagnosis not present

## 2020-08-07 DIAGNOSIS — M25512 Pain in left shoulder: Secondary | ICD-10-CM | POA: Diagnosis not present

## 2020-08-07 DIAGNOSIS — R1084 Generalized abdominal pain: Secondary | ICD-10-CM | POA: Diagnosis not present

## 2020-08-07 DIAGNOSIS — H353131 Nonexudative age-related macular degeneration, bilateral, early dry stage: Secondary | ICD-10-CM | POA: Diagnosis not present

## 2020-08-07 DIAGNOSIS — R109 Unspecified abdominal pain: Secondary | ICD-10-CM | POA: Diagnosis not present

## 2020-08-07 DIAGNOSIS — H11159 Pinguecula, unspecified eye: Secondary | ICD-10-CM | POA: Diagnosis not present

## 2020-08-07 DIAGNOSIS — G8929 Other chronic pain: Secondary | ICD-10-CM | POA: Diagnosis not present

## 2020-08-07 DIAGNOSIS — H04129 Dry eye syndrome of unspecified lacrimal gland: Secondary | ICD-10-CM | POA: Diagnosis not present

## 2020-08-07 DIAGNOSIS — M25511 Pain in right shoulder: Secondary | ICD-10-CM | POA: Diagnosis not present

## 2020-08-07 DIAGNOSIS — H5201 Hypermetropia, right eye: Secondary | ICD-10-CM | POA: Diagnosis not present

## 2020-08-07 DIAGNOSIS — H524 Presbyopia: Secondary | ICD-10-CM | POA: Diagnosis not present

## 2020-08-07 DIAGNOSIS — H353132 Nonexudative age-related macular degeneration, bilateral, intermediate dry stage: Secondary | ICD-10-CM | POA: Diagnosis not present

## 2020-08-07 DIAGNOSIS — H52223 Regular astigmatism, bilateral: Secondary | ICD-10-CM | POA: Diagnosis not present

## 2020-08-07 LAB — COMPREHENSIVE METABOLIC PANEL
ALT: 14 U/L (ref 0–35)
AST: 15 U/L (ref 0–37)
Albumin: 4.4 g/dL (ref 3.5–5.2)
Alkaline Phosphatase: 74 U/L (ref 39–117)
BUN: 21 mg/dL (ref 6–23)
CO2: 26 mEq/L (ref 19–32)
Calcium: 9 mg/dL (ref 8.4–10.5)
Chloride: 102 mEq/L (ref 96–112)
Creatinine, Ser: 1.35 mg/dL — ABNORMAL HIGH (ref 0.40–1.20)
GFR: 34.91 mL/min — ABNORMAL LOW (ref >60.00)
Glucose, Bld: 102 mg/dL — ABNORMAL HIGH (ref 70–99)
Potassium: 4.6 mEq/L (ref 3.5–5.1)
Sodium: 135 mEq/L (ref 135–145)
Total Bilirubin: 0.4 mg/dL (ref 0.2–1.2)
Total Protein: 7 g/dL (ref 6.0–8.3)

## 2020-08-07 LAB — CBC WITH DIFFERENTIAL/PLATELET
Basophils Absolute: 0.1 10*3/uL (ref 0.0–0.1)
Basophils Relative: 1.3 % (ref 0.0–3.0)
Eosinophils Absolute: 0.1 10*3/uL (ref 0.0–0.7)
Eosinophils Relative: 1.9 % (ref 0.0–5.0)
HCT: 30.9 % — ABNORMAL LOW (ref 36.0–46.0)
Hemoglobin: 10.6 g/dL — ABNORMAL LOW (ref 12.0–15.0)
Lymphocytes Relative: 22.3 % (ref 12.0–46.0)
Lymphs Abs: 1.7 10*3/uL (ref 0.7–4.0)
MCHC: 34.3 g/dL (ref 30.0–36.0)
MCV: 91 fl (ref 78.0–100.0)
Monocytes Absolute: 0.7 10*3/uL (ref 0.1–1.0)
Monocytes Relative: 9.1 % (ref 3.0–12.0)
Neutro Abs: 5 10*3/uL (ref 1.4–7.7)
Neutrophils Relative %: 65.4 % (ref 43.0–77.0)
Platelets: 308 10*3/uL (ref 150.0–400.0)
RBC: 3.39 Mil/uL — ABNORMAL LOW (ref 3.87–5.11)
RDW: 12.8 % (ref 11.5–15.5)
WBC: 7.7 10*3/uL (ref 4.0–10.5)

## 2020-08-07 LAB — LIPASE: Lipase: 31 U/L (ref 11.0–59.0)

## 2020-08-07 NOTE — Progress Notes (Signed)
Subjective:    Patient ID: [name], female    DOB: 10-Jun-1929, 86 y.o.   MRN: [id number]  HPI  86 year old female who  has a past medical history of Anemia, Chronic fatigue, Chronic renal insufficiency, stage III (moderate) ([place]), Depression, HYPERTENSION (04/04/2007), HYPOTHYROIDISM (04/04/2007), Lewy body dementia ([place]), and Macular degeneration.   She presents to the office today with her son for multiple issues  She reports that for an unknown amount of time she has been experiencing urinary frequency and urgency.  She denies dysuria or hematuria.  Additionally she reports that over the last couple weeks she is also been experiencing right flank pain as well as right-sided abdominal pain that stops about midline.  She denies nausea or vomiting.  Does endorse having a bowel movement daily but that sometimes her bowel movements are hard.  She also complains of pain in her shoulders this started about the same time that she was started on Seroquel and Aricept by neurology.  Starting these medications her son reports that she is no longer experiencing hallucinations and her behavioral disturbances have improved.   Review of Systems See HPI   Past Medical History:  Diagnosis Date  . Anemia   . Chronic fatigue   . Chronic renal insufficiency, stage III (moderate) ([facility])   . Depression   . HYPERTENSION 04/04/2007  . HYPOTHYROIDISM 04/04/2007  . Lewy body dementia ([place])   . Macular degeneration     Social History   Socioeconomic History  . Marital status: Widowed    Spouse name: Not on file  . Number of children: 1  . Years of education: 9th grade  . Highest education level: 9th grade  Occupational History  . Occupation: Retired    Fish farm manager: RETIRED  Tobacco Use  . Smoking status: Former Smoker    Packs/day: 1.00    Years: 7.00    Pack years: 7.00    Types: Cigarettes    Quit date: 10/13/1969    Years since quitting: 50.8  . Smokeless tobacco: Never Used  Vaping Use  .  Vaping Use: Never used  Substance and Sexual Activity  . Alcohol use: No  . Drug use: No  . Sexual activity: Not Currently  Other Topics Concern  . Not on file  Social History Narrative   Widowed   1 son   Right handed    Lives alone    Social Determinants of Health   Financial Resource Strain: Low Risk   . Difficulty of Paying Living Expenses: Not very hard  Food Insecurity: No Food Insecurity  . Worried About Charity fundraiser in the Last Year: Never true  . Ran Out of Food in the Last Year: Never true  Transportation Needs: No Transportation Needs  . Lack of Transportation (Medical): No  . Lack of Transportation (Non-Medical): No  Physical Activity: Insufficiently Active  . Days of Exercise per Week: 6 days  . Minutes of Exercise per Session: 10 min  Stress: No Stress Concern Present  . Feeling of Stress : Only a little  Social Connections: Moderately Integrated  . Frequency of Communication with Friends and Family: More than three times a week  . Frequency of Social Gatherings with Friends and Family: More than three times a week  . Attends Religious Services: More than 4 times per year  . Active Member of Clubs or Organizations: Yes  . Attends Archivist Meetings: More than 4 times per year  . Marital  Status: Widowed  Intimate Partner Violence: Not on file    Past Surgical History:  Procedure Laterality Date  . ABDOMINAL HYSTERECTOMY    . APPENDECTOMY    . BACK SURGERY  2012  . Cataracts    . CHOLECYSTECTOMY    . ERCP N/A 11/22/2014   Procedure: ENDOSCOPIC RETROGRADE CHOLANGIOPANCREATOGRAPHY (ERCP);  Surgeon: Vida Rigger, MD;  Location: [facility] ENDOSCOPY;  Service: Endoscopy;  Laterality: N/A;  . HERNIA REPAIR    . OOPHORECTOMY    . ORIF TIBIA FRACTURE  2012   dr duda  . SPINE SURGERY  2011   cervical    Family History  Problem Relation Age of Onset  . Kidney cancer Mother 46       renal cell CA  . Heart disease Father   . Lymphoma Son   . Breast  cancer Daughter     Allergies  Allergen Reactions  . Oxycodone Hcl     REACTION: itching    Current Outpatient Medications on File Prior to Visit  Medication Sig Dispense Refill  . albuterol (PROAIR HFA) 108 (90 Base) MCG/ACT inhaler 1-2 puffs every 6 hours as needed. 8 g 2  . amLODipine (NORVASC) 10 MG tablet TAKE 1 TABLET BY MOUTH  DAILY 90 tablet 3  . cetirizine (ZYRTEC) 10 MG tablet Take 10 mg by mouth daily.    Marland Kitchen donepezil (ARICEPT) 10 MG tablet Take 1/2 tablet daily for 2 weeks, then increase to 1 tablet daily and continue 90 tablet 0  . levothyroxine (SYNTHROID) 150 MCG tablet TAKE 1 TABLET BY MOUTH  DAILY 90 tablet 3  . Multiple Vitamin (MULTIVITAMIN) tablet Take 1 tablet by mouth daily.    . Multiple Vitamins-Minerals (PRESERVISION AREDS 2 PO) Take by mouth.    . olmesartan (BENICAR) 20 MG tablet TAKE 1 TABLET BY MOUTH  DAILY 90 tablet 3  . omeprazole (PRILOSEC) 40 MG capsule TAKE 1 CAPSULE BY MOUTH  DAILY 90 capsule 3  . QUEtiapine (SEROQUEL) 25 MG tablet TAKE ONE-HALF TABLET BY  MOUTH IN THE MORNING AND 1  TABLET IN THE EVENING 45 tablet 0  . sertraline (ZOLOFT) 25 MG tablet TAKE 1 TABLET BY MOUTH AT  BEDTIME 90 tablet 3   No current facility-administered medications on file prior to visit.    BP (!) 152/68   Pulse (!) 58   Temp 98.8 F (37.1 C) (Oral)   Ht 5\' 2"  (1.575 m)   Wt 161 lb 9.6 oz (73.3 kg)   BMI 29.56 kg/m       Objective:   Physical Exam Vitals and nursing note reviewed.  Constitutional:      Appearance: Normal appearance.  Cardiovascular:     Rate and Rhythm: Normal rate and regular rhythm.     Pulses: Normal pulses.     Heart sounds: Normal heart sounds.  Pulmonary:     Effort: Pulmonary effort is normal.     Breath sounds: Normal breath sounds.  Abdominal:     General: Abdomen is flat. Bowel sounds are normal.     Palpations: Abdomen is soft.     Tenderness: There is abdominal tenderness in the right upper quadrant, right lower quadrant,  periumbilical area and suprapubic area. There is no right CVA tenderness or left CVA tenderness. Negative signs include Murphy's sign and McBurney's sign.     Comments: She does have "tenderness in the right upper and right lower quadrant with palpation, this coincides along previous surgical incision she had a cholecystectomy  Neurological:     Mental Status: She is alert.       Assessment & Plan:  1. Generalized abdominal pain -Unknown cause, possibly constipation versus UTI.  Will check lab work and KUB if these are normal can consider further imaging in the future - DG Abd 1 View; Future - POC Urinalysis Dipstick - CBC with Differential/Platelet; Future - Comprehensive metabolic panel; Future - Lipase; Future - Culture, Urine; Future - Culture, Urine - Lipase - Comprehensive metabolic panel - CBC with Differential/Platelet  2. Chronic pain of both shoulders -Per up-to-date both Seroquel and Aricept have a 1 to 2% chance of causing arthralgia.  I would like her to continue with these medications.  In the off chance that these medications are causing the discomfort she was advised that she can try taking a Tylenol in the morning to see if this helps with her joint pain.  3. Urinary frequency  - POC Urinalysis Dipstick - Culture, Urine; Future   Dorothyann Peng, NP

## 2020-08-07 NOTE — Patient Instructions (Signed)
It was great seeing you today   I think the medication you are taking is working well for you   I am going to get some blood work and an Personal assistant on you and will follow up with you regarding your results

## 2020-08-08 ENCOUNTER — Telehealth: Payer: Self-pay | Admitting: Adult Health

## 2020-08-08 LAB — URINE CULTURE
MICRO NUMBER:: 11366365
SPECIMEN QUALITY:: ADEQUATE

## 2020-08-08 NOTE — Telephone Encounter (Signed)
Updated patients son on labs and xray, all are unremarkable. Awaiting urinalysis and culture and will follow up with him once that is done as well

## 2020-09-02 ENCOUNTER — Other Ambulatory Visit: Payer: Self-pay

## 2020-09-02 ENCOUNTER — Ambulatory Visit (INDEPENDENT_AMBULATORY_CARE_PROVIDER_SITE_OTHER): Payer: Medicare Other | Admitting: Neurology

## 2020-09-02 ENCOUNTER — Encounter: Payer: Self-pay | Admitting: Neurology

## 2020-09-02 VITALS — BP 179/68 | HR 60 | Ht 62.0 in | Wt 162.8 lb

## 2020-09-02 DIAGNOSIS — G3183 Dementia with Lewy bodies: Secondary | ICD-10-CM | POA: Diagnosis not present

## 2020-09-02 DIAGNOSIS — F02818 Dementia in other diseases classified elsewhere, unspecified severity, with other behavioral disturbance: Secondary | ICD-10-CM

## 2020-09-02 DIAGNOSIS — F0281 Dementia in other diseases classified elsewhere with behavioral disturbance: Secondary | ICD-10-CM | POA: Diagnosis not present

## 2020-09-02 MED ORDER — DONEPEZIL HCL 10 MG PO TABS: ORAL_TABLET | ORAL | 3 refills | Status: DC

## 2020-09-02 MED ORDER — QUETIAPINE FUMARATE 25 MG PO TABS: ORAL_TABLET | ORAL | 3 refills | Status: DC

## 2020-09-02 NOTE — Patient Instructions (Signed)
1. Try increasing the Donepezil (Aricept) 10mg : Take 1 tablet daily. If you cannot tolerate it, go back to 1/2 tablet daily  2. Continue Quetiapine 25mg : take 1 tablet every night  3. Discuss cramps with PCP  4. Follow-up in 6-8 months, call for any changes   FALL PRECAUTIONS: Be cautious when walking. Scan the area for obstacles that may increase the risk of trips and falls. When getting up in the mornings, sit up at the edge of the bed for a few minutes before getting out of bed. Consider elevating the bed at the head end to avoid drop of blood pressure when getting up. Walk always in a well-lit room (use night lights in the walls). Avoid area rugs or power cords from appliances in the middle of the walkways. Use a walker or a cane if necessary and consider physical therapy for balance exercise. Get your eyesight checked regularly.  FINANCIAL OVERSIGHT: Supervision, especially oversight when making financial decisions or transactions is also recommended.  HOME SAFETY: Consider the safety of the kitchen when operating appliances like stoves, microwave oven, and blender. Consider having supervision and share cooking responsibilities until no longer able to participate in those. Accidents with firearms and other hazards in the house should be identified and addressed as well.  ABILITY TO BE LEFT ALONE: If patient is unable to contact 911 operator, consider using LifeLine, or when the need is there, arrange for someone to stay with patients. Smoking is a fire hazard, consider supervision or cessation. Risk of wandering should be assessed by caregiver and if detected at any point, supervision and safe proof recommendations should be instituted.  MEDICATION SUPERVISION: Inability to self-administer medication needs to be constantly addressed. Implement a mechanism to ensure safe administration of the medications.  RECOMMENDATIONS FOR ALL PATIENTS WITH MEMORY PROBLEMS: 1. Continue to exercise  (Recommend 30 minutes of walking everyday, or 3 hours every week) 2. Increase social interactions - continue going to [place] and enjoy social gatherings with friends and family 3. Eat healthy, avoid fried foods and eat more fruits and vegetables 4. Maintain adequate blood pressure, blood sugar, and blood cholesterol level. Reducing the risk of stroke and cardiovascular disease also helps promoting better memory. 5. Avoid stressful situations. Live a simple life and avoid aggravations. Organize your time and prepare for the next day in anticipation. 6. Sleep well, avoid any interruptions of sleep and avoid any distractions in the bedroom that may interfere with adequate sleep quality 7. Avoid sugar, avoid sweets as there is a strong link between excessive sugar intake, diabetes, and cognitive impairment The Mediterranean diet has been shown to help patients reduce the risk of progressive memory disorders and reduces cardiovascular risk. This includes eating fish, eat fruits and green leafy vegetables, nuts like almonds and hazelnuts, walnuts, and also use olive oil. Avoid fast foods and fried foods as much as possible. Avoid sweets and sugar as sugar use has been linked to worsening of memory function.  There is always a concern of gradual progression of memory problems. If this is the case, then we may need to adjust level of care according to patient needs. Support, both to the patient and caregiver, should then be put into place.
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
## 2020-09-23 ENCOUNTER — Telehealth: Payer: Self-pay | Admitting: Adult Health

## 2020-09-23 NOTE — Telephone Encounter (Signed)
Spoke to patient son [name] to  schedule Medicare Annual Wellness Visit (AWV) either virtually or in office.on phone visit  He will call back to schedule after talking to patient   Last AWV 09/24/20  please schedule at anytime with [facility] Nurse Health Advisor 1 or 2   This should be a 45 minute visit.

## 2020-09-25 ENCOUNTER — Ambulatory Visit (INDEPENDENT_AMBULATORY_CARE_PROVIDER_SITE_OTHER): Payer: Medicare Other

## 2020-09-25 DIAGNOSIS — Z0000 Encounter for general adult medical examination without abnormal findings: Secondary | ICD-10-CM

## 2020-09-25 NOTE — Patient Instructions (Signed)
[name] , Thank you for taking time to come for your Medicare Wellness Visit. I appreciate your ongoing commitment to your health goals. Please review the following plan we discussed and let me know if I can assist you in the future.   Screening recommendations/referrals: Colonoscopy: No longer required  Mammogram: No longer required Bone Density: No longer required  Recommended yearly ophthalmology/optometry visit for glaucoma screening and checkup Recommended yearly dental visit for hygiene and checkup  Vaccinations: Influenza vaccine: Currently due, you may receive in our office or at your local pharmacy  Pneumococcal vaccine: Completed series  Tdap vaccine: Up to date, next due 09/06/2021 Shingles vaccine: Currently due for Shingrix, if you wish to receive we recommend that you receive at your local pharmacy as it is less expensive     Advanced directives: Please bring copies of your advanced medical directives into our office so that we may scan them into your chart.   Conditions/risks identified: None   Next appointment: 09/26/2021 @ 2:45 PM with [place] 65 Years and Older, Female Preventive care refers to lifestyle choices and visits with your health care provider that can promote health and wellness. What does preventive care include?  A yearly physical exam. This is also called an annual well check.  Dental exams once or twice a year.  Routine eye exams. Ask your health care provider how often you should have your eyes checked.  Personal lifestyle choices, including:  Daily care of your teeth and gums.  Regular physical activity.  Eating a healthy diet.  Avoiding tobacco and drug use.  Limiting alcohol use.  Practicing safe sex.  Taking low-dose aspirin every day.  Taking vitamin and mineral supplements as recommended by your health care provider. What happens during an annual well check? The services and screenings done by  your health care provider during your annual well check will depend on your age, overall health, lifestyle risk factors, and family history of disease. Counseling  Your health care provider may ask you questions about your:  Alcohol use.  Tobacco use.  Drug use.  Emotional well-being.  Home and relationship well-being.  Sexual activity.  Eating habits.  History of falls.  Memory and ability to understand (cognition).  Work and work Statistician.  Reproductive health. Screening  You may have the following tests or measurements:  Height, weight, and BMI.  Blood pressure.  Lipid and cholesterol levels. These may be checked every 5 years, or more frequently if you are over 63 years old.  Skin check.  Lung cancer screening. You may have this screening every year starting at age 67 if you have a 30-pack-year history of smoking and currently smoke or have quit within the past 15 years.  Fecal occult blood test (FOBT) of the stool. You may have this test every year starting at age 45.  Flexible sigmoidoscopy or colonoscopy. You may have a sigmoidoscopy every 5 years or a colonoscopy every 10 years starting at age 31.  Hepatitis C blood test.  Hepatitis B blood test.  Sexually transmitted disease (STD) testing.  Diabetes screening. This is done by checking your blood sugar (glucose) after you have not eaten for a while (fasting). You may have this done every 1-3 years.  Bone density scan. This is done to screen for osteoporosis. You may have this done starting at age 87.  Mammogram. This may be done every 1-2 years. Talk to your health care provider about how often  you should have regular mammograms. Talk with your health care provider about your test results, treatment options, and if necessary, the need for more tests. Vaccines  Your health care provider may recommend certain vaccines, such as:  Influenza vaccine. This is recommended every year.  Tetanus,  diphtheria, and acellular pertussis (Tdap, Td) vaccine. You may need a Td booster every 10 years.  Zoster vaccine. You may need this after age 56.  Pneumococcal 13-valent conjugate (PCV13) vaccine. One dose is recommended after age 26.  Pneumococcal polysaccharide (PPSV23) vaccine. One dose is recommended after age 47. Talk to your health care provider about which screenings and vaccines you need and how often you need them. This information is not intended to replace advice given to you by your health care provider. Make sure you discuss any questions you have with your health care provider. Document Released: 08/23/2015 Document Revised: 04/15/2016 Document Reviewed: 05/28/2015 Elsevier Interactive Patient Education  2017 [place] Prevention in the Home Falls can cause injuries. They can happen to people of all ages. There are many things you can do to make your home safe and to help prevent falls. What can I do on the outside of my home?  Regularly fix the edges of walkways and driveways and fix any cracks.  Remove anything that might make you trip as you walk through a door, such as a raised step or threshold.  Trim any bushes or trees on the path to your home.  Use bright outdoor lighting.  Clear any walking paths of anything that might make someone trip, such as rocks or tools.  Regularly check to see if handrails are loose or broken. Make sure that both sides of any steps have handrails.  Any raised decks and porches should have guardrails on the edges.  Have any leaves, snow, or ice cleared regularly.  Use sand or salt on walking paths during winter.  Clean up any spills in your garage right away. This includes oil or grease spills. What can I do in the bathroom?  Use night lights.  Install grab bars by the toilet and in the tub and shower. Do not use towel bars as grab bars.  Use non-skid mats or decals in the tub or shower.  If you need to sit down in  the shower, use a plastic, non-slip stool.  Keep the floor dry. Clean up any water that spills on the floor as soon as it happens.  Remove soap buildup in the tub or shower regularly.  Attach bath mats securely with double-sided non-slip rug tape.  Do not have throw rugs and other things on the floor that can make you trip. What can I do in the bedroom?  Use night lights.  Make sure that you have a light by your bed that is easy to reach.  Do not use any sheets or blankets that are too big for your bed. They should not hang down onto the floor.  Have a firm chair that has side arms. You can use this for support while you get dressed.  Do not have throw rugs and other things on the floor that can make you trip. What can I do in the kitchen?  Clean up any spills right away.  Avoid walking on wet floors.  Keep items that you use a lot in easy-to-reach places.  If you need to reach something above you, use a strong step stool that has a grab bar.  Keep electrical cords  out of the way.  Do not use floor polish or wax that makes floors slippery. If you must use wax, use non-skid floor wax.  Do not have throw rugs and other things on the floor that can make you trip. What can I do with my stairs?  Do not leave any items on the stairs.  Make sure that there are handrails on both sides of the stairs and use them. Fix handrails that are broken or loose. Make sure that handrails are as long as the stairways.  Check any carpeting to make sure that it is firmly attached to the stairs. Fix any carpet that is loose or worn.  Avoid having throw rugs at the top or bottom of the stairs. If you do have throw rugs, attach them to the floor with carpet tape.  Make sure that you have a light switch at the top of the stairs and the bottom of the stairs. If you do not have them, ask someone to add them for you. What else can I do to help prevent falls?  Wear shoes that:  Do not have high  heels.  Have rubber bottoms.  Are comfortable and fit you well.  Are closed at the toe. Do not wear sandals.  If you use a stepladder:  Make sure that it is fully opened. Do not climb a closed stepladder.  Make sure that both sides of the stepladder are locked into place.  Ask someone to hold it for you, if possible.  Clearly mark and make sure that you can see:  Any grab bars or handrails.  First and last steps.  Where the edge of each step is.  Use tools that help you move around (mobility aids) if they are needed. These include:  Canes.  Walkers.  Scooters.  Crutches.  Turn on the lights when you go into a dark area. Replace any light bulbs as soon as they burn out.  Set up your furniture so you have a clear path. Avoid moving your furniture around.  If any of your floors are uneven, fix them.  If there are any pets around you, be aware of where they are.  Review your medicines with your doctor. Some medicines can make you feel dizzy. This can increase your chance of falling. Ask your doctor what other things that you can do to help prevent falls. This information is not intended to replace advice given to you by your health care provider. Make sure you discuss any questions you have with your health care provider. Document Released: 05/23/2009 Document Revised: 01/02/2016 Document Reviewed: 08/31/2014 Elsevier Interactive Patient Education  2017 [employer].

## 2020-09-25 NOTE — Progress Notes (Signed)
Subjective:   [name] is a 87 y.o. female who presents for an Initial Medicare Annual Wellness Visit.  I connected with [name]  today by telephone and verified that I am speaking with the correct person using two identifiers. Location patient: home Location provider: work Persons participating in the virtual visit: patient, provider.   I discussed the limitations, risks, security and privacy concerns of performing an evaluation and management service by telephone and the availability of in person appointments. I also discussed with the patient that there may be a patient responsible charge related to this service. The patient expressed understanding and verbally consented to this telephonic visit.    Interactive audio and video telecommunications were attempted between this provider and patient, however failed, due to patient having technical difficulties OR patient did not have access to video capability.  We continued and completed visit with audio only.       Review of Systems    N/A  Cardiac Risk Factors include: advanced age (>86men, >24 women);hypertension     Objective:    Today's Vitals   There is no height or weight on file to calculate BMI.  Advanced Directives 09/25/2020 09/02/2020 05/09/2020 09/25/2019 06/30/2018 06/15/2017 06/15/2017  Does Patient Have a Medical Advance Directive? Yes Yes Yes Yes No Yes Yes  Type of Paramedic of [place];Living will [place];Living will [place];Living will - - - -  Does patient want to make changes to medical advance directive? No - Patient declined - - No - Patient declined - - -  Copy of [place] in Chart? No - copy requested - - - - - -    Current Medications (verified) Outpatient Encounter Medications as of 09/25/2020  Medication Sig  . amLODipine (NORVASC) 10 MG tablet TAKE 1 TABLET BY MOUTH  DAILY  . cetirizine (ZYRTEC) 10 MG tablet  Take 10 mg by mouth daily.  Marland Kitchen donepezil (ARICEPT) 10 MG tablet Take 1 tablet daily  . levothyroxine (SYNTHROID) 150 MCG tablet TAKE 1 TABLET BY MOUTH  DAILY  . Multiple Vitamin (MULTIVITAMIN) tablet Take 1 tablet by mouth daily.  . Multiple Vitamins-Minerals (PRESERVISION AREDS 2 PO) Take by mouth.  . olmesartan (BENICAR) 20 MG tablet TAKE 1 TABLET BY MOUTH  DAILY  . omeprazole (PRILOSEC) 40 MG capsule TAKE 1 CAPSULE BY MOUTH  DAILY  . QUEtiapine (SEROQUEL) 25 MG tablet TAKE 1  TABLET IN THE EVENING  . sertraline (ZOLOFT) 25 MG tablet TAKE 1 TABLET BY MOUTH AT  BEDTIME  . albuterol (PROAIR HFA) 108 (90 Base) MCG/ACT inhaler 1-2 puffs every 6 hours as needed. (Patient not taking: No sig reported)   No facility-administered encounter medications on file as of 09/25/2020.    Allergies (verified) Oxycodone hcl   History: Past Medical History:  Diagnosis Date  . Anemia   . Chronic fatigue   . Chronic renal insufficiency, stage III (moderate) (HCC)   . Depression   . HYPERTENSION 04/04/2007  . HYPOTHYROIDISM 04/04/2007  . Lewy body dementia ([place])   . Macular degeneration    Past Surgical History:  Procedure Laterality Date  . ABDOMINAL HYSTERECTOMY    . APPENDECTOMY    . BACK SURGERY  2012  . Cataracts    . CHOLECYSTECTOMY    . ERCP N/A 11/22/2014   Procedure: ENDOSCOPIC RETROGRADE CHOLANGIOPANCREATOGRAPHY (ERCP);  Surgeon: Clarene Essex, MD;  Location: [facility];  Service: Endoscopy;  Laterality: N/A;  . HERNIA  REPAIR    . OOPHORECTOMY    . ORIF TIBIA FRACTURE  2012   dr duda  . SPINE SURGERY  2011   cervical   Family History  Problem Relation Age of Onset  . Kidney cancer Mother 55       renal cell CA  . Heart disease Father   . Lymphoma Son   . Breast cancer Daughter    Social History   Socioeconomic History  . Marital status: Widowed    Spouse name: Not on file  . Number of children: 1  . Years of education: 9th grade  . Highest education level: 9th grade   Occupational History  . Occupation: Retired    Fish farm manager: RETIRED  Tobacco Use  . Smoking status: Former Smoker    Packs/day: 1.00    Years: 7.00    Pack years: 7.00    Types: Cigarettes    Quit date: 10/13/1969    Years since quitting: 50.9  . Smokeless tobacco: Never Used  Vaping Use  . Vaping Use: Never used  Substance and Sexual Activity  . Alcohol use: No  . Drug use: No  . Sexual activity: Not Currently  Other Topics Concern  . Not on file  Social History Narrative   Widowed   1 son   Right handed    Lives alone    Social Determinants of Health   Financial Resource Strain: Low Risk   . Difficulty of Paying Living Expenses: Not hard at all  Food Insecurity: No Food Insecurity  . Worried About Charity fundraiser in the Last Year: Never true  . Ran Out of Food in the Last Year: Never true  Transportation Needs: No Transportation Needs  . Lack of Transportation (Medical): No  . Lack of Transportation (Non-Medical): No  Physical Activity: Inactive  . Days of Exercise per Week: 0 days  . Minutes of Exercise per Session: 0 min  Stress: No Stress Concern Present  . Feeling of Stress : Not at all  Social Connections: Moderately Isolated  . Frequency of Communication with Friends and Family: More than three times a week  . Frequency of Social Gatherings with Friends and Family: More than three times a week  . Attends Religious Services: More than 4 times per year  . Active Member of Clubs or Organizations: No  . Attends Archivist Meetings: Never  . Marital Status: Widowed    Tobacco Counseling Counseling given: Not Answered   Clinical Intake:  Pre-visit preparation completed: Yes  Pain : No/denies pain     Nutritional Risks: Nausea/ vomitting/ diarrhea (Diarrhea) Diabetes: No  How often do you need to have someone help you when you read instructions, pamphlets, or other written materials from your doctor or pharmacy?: 5 - Always What is the  last grade level you completed in school?: 9th Grade  Diabetic?No   Interpreter Needed?: No  Information entered by :: [place] of Daily Living In your present state of health, do you have any difficulty performing the following activities: 09/25/2020  Hearing? Y  Comment has some hearing loss  Vision? N  Difficulty concentrating or making decisions? Y  Walking or climbing stairs? N  Dressing or bathing? N  Doing errands, shopping? Y  Comment Patient no longer drives  Preparing Food and eating ? N  Using the Toilet? N  In the past six months, have you accidently leaked urine? N  Do you have problems with loss of  bowel control? N  Managing your Medications? N  Managing your Finances? N  Housekeeping or managing your Housekeeping? N  Some recent data might be hidden    Patient Care Team: Dorothyann Peng, NP as PCP - General (Family Medicine) Cameron Sprang, MD as Consulting Physician (Neurology)  Indicate any recent Medical Services you may have received from other than [facility] providers in the past year (date may be approximate).     Assessment:   This is a routine wellness examination for [name].  Hearing/Vision screen  Hearing Screening   125Hz  250Hz  500Hz  1000Hz  2000Hz  3000Hz  4000Hz  6000Hz  8000Hz   Right ear:           Left ear:           Vision Screening Comments: States gets eyes examined once per year. Wears glasses    Dietary issues and exercise activities discussed:    Goals    . Exercise 3x per week (30 min per time)    . prevent worsening memory loss     Keep memory from worsening       Depression Screen PHQ 2/9 Scores 09/25/2020 09/25/2019 06/30/2018 06/15/2017 01/15/2017 11/05/2014 08/09/2013  PHQ - 2 Score 0 2 0 2 0 0 1  PHQ- 9 Score - 3 - 9 - - -    Fall Risk Fall Risk  09/25/2020 09/02/2020 05/09/2020 09/25/2019 03/31/2019  Falls in the past year? 0 0 0 0 1  Comment - - - - -  Number falls in past yr: 0 0 0 - 0  Injury with Fall? 0 0 0 -  0  Risk for fall due to : No Fall Risks - - Medication side effect;Orthopedic patient History of fall(s);Impaired balance/gait;Impaired mobility  Follow up Falls evaluation completed;Falls prevention discussed - - Falls evaluation completed;Education provided;Falls prevention discussed Falls evaluation completed;Education provided;Falls prevention discussed    FALL RISK PREVENTION PERTAINING TO THE HOME:  Any stairs in or around the home? No  If so, are there any without handrails? No  Home free of loose throw rugs in walkways, pet beds, electrical cords, etc? Yes  Adequate lighting in your home to reduce risk of falls? Yes   ASSISTIVE DEVICES UTILIZED TO PREVENT FALLS:  Life alert? No  Use of a cane, walker or w/c? No  Grab bars in the bathroom? No  Shower chair or bench in shower? No  Elevated toilet seat or a handicapped toilet? No     Cognitive Function: Cognitive status assessed by direct observation. Patient has current diagnosis of cognitive impairment. Patient is followed by neurology for ongoing assessment. Patient is unable to complete screening 6CIT or MMSE.   MMSE - Mini Mental State Exam 10/06/2019 06/30/2018 06/30/2018 06/15/2017  Not completed: - (No Data) (No Data) (No Data)  Orientation to time 5 - - -  Orientation to Place 5 - - -  Registration 3 - - -  Attention/ Calculation 5 - - -  Recall 1 - - -  Language- name 2 objects 2 - - -  Language- repeat 1 - - -  Language- follow 3 step command 3 - - -  Language- read & follow direction 1 - - -  Write a sentence 1 - - -  Copy design 0 - - -  Total score 27 - - -   Montreal Cognitive Assessment  05/09/2020  Visuospatial/ Executive (0/5) 0  Naming (0/3) 3  Attention: Read list of digits (0/2) 2  Attention: Read list of letters (  0/1) 0  Attention: Serial 7 subtraction starting at 100 (0/3) 1  Language: Repeat phrase (0/2) 0  Language : Fluency (0/1) 1  Abstraction (0/2) 0  Delayed Recall (0/5) 0  Orientation  (0/6) 4  Total 11  Adjusted Score (based on education) 12   6CIT Screen 09/25/2019  What Year? 0 points  What month? 0 points  What time? 0 points  Count back from 20 0 points  Months in reverse 4 points  Repeat phrase 8 points  Total Score 12    Immunizations Immunization History  Administered Date(s) Administered  . Fluad Quad(high Dose 65+) 03/31/2019  . Influenza Split 05/17/2012  . Influenza Whole 08/18/2007, 05/24/2009, 05/06/2010, 05/10/2012  . Influenza, High Dose Seasonal PF 06/23/2013, 07/25/2014, 07/07/2016, 06/17/2017  . Influenza-Unspecified 07/15/2015  . PFIZER(Purple Top)SARS-COV-2 Vaccination 08/25/2019, 09/17/2019  . Pneumococcal Conjugate-13 02/11/2017  . Pneumococcal Polysaccharide-23 08/18/2007  . Tdap 09/07/2011    TDAP status: Up to date  Flu Vaccine status: Due, Education has been provided regarding the importance of this vaccine. Advised may receive this vaccine at local pharmacy or Health Dept. Aware to provide a copy of the vaccination record if obtained from local pharmacy or Health Dept. Verbalized acceptance and understanding.  Pneumococcal vaccine status: Up to date  Covid-19 vaccine status: Completed vaccines  Qualifies for Shingles Vaccine? Yes   Zostavax completed No   Shingrix Completed?: No.    Education has been provided regarding the importance of this vaccine. Patient has been advised to call insurance company to determine out of pocket expense if they have not yet received this vaccine. Advised may also receive vaccine at local pharmacy or Health Dept. Verbalized acceptance and understanding.  Screening Tests Health Maintenance  Topic Date Due  . INFLUENZA VACCINE  03/10/2020  . COVID-19 Vaccine (3 - Booster for Pfizer series) 03/16/2020  . TETANUS/TDAP  09/06/2021  . PNA vac Low Risk Adult  Completed    Health Maintenance  Health Maintenance Due  Topic Date Due  . INFLUENZA VACCINE  03/10/2020  . COVID-19 Vaccine (3 -  Booster for Pfizer series) 03/16/2020    Colorectal cancer screening: No longer required.   Mammogram status: No longer required due to age.  Bone Density Status: No longer required   Lung Cancer Screening: (Low Dose CT Chest recommended if Age 83-80 years, 30 pack-year currently smoking OR have quit w/in 15years.) does not qualify.   Lung Cancer Screening Referral: N/A   Additional Screening:  Hepatitis C Screening: does not qualify;   Vision Screening: Recommended annual ophthalmology exams for early detection of glaucoma and other disorders of the eye. Is the patient up to date with their annual eye exam?  Yes  Who is the provider or what is the name of the office in which the patient attends annual eye exams? Dr. Einar Gip If pt is not established with a provider, would they like to be referred to a provider to establish care? No .   Dental Screening: Recommended annual dental exams for proper oral hygiene  Community Resource Referral / Chronic Care Management: CRR required this visit?  No   CCM required this visit?  No      Plan:     I have personally reviewed and noted the following in the patient's chart:   . Medical and social history . Use of alcohol, tobacco or illicit drugs  . Current medications and supplements . Functional ability and status . Nutritional status . Physical activity . Advanced directives .  List of other physicians . Hospitalizations, surgeries, and ER visits in previous 12 months . Vitals . Screenings to include cognitive, depression, and falls . Referrals and appointments  In addition, I have reviewed and discussed with patient certain preventive protocols, quality metrics, and best practice recommendations. A written personalized care plan for preventive services as well as general preventive health recommendations were provided to patient.     [name] Neas, LPN   [id number]/61/[id number]   Nurse Notes: None

## 2020-10-11 ENCOUNTER — Telehealth: Payer: Self-pay | Admitting: Adult Health

## 2020-10-11 NOTE — Telephone Encounter (Signed)
I am sorry, I cannot call in abx without first being evaluated. The only thing I can recommend is if they symptom have been present less than 7 days then can start taking Mucinex to help with cough and congestion. If she develops a fever over the weekend then I would go to urgent care

## 2020-10-11 NOTE — Telephone Encounter (Signed)
Spoke to patient and gave him the provider's recommendations. He reports he does not live with patient but will be in her house shortly to see how she is doing and take her temperature. He will follow advise depending on her temp.

## 2020-10-11 NOTE — Telephone Encounter (Signed)
Patient son is calling and stated that patient is experiencing coughing and congestion and wanted to see if provider could call in antibiotics to [facility] on [address], please advise. CB is [phone]

## 2020-10-24 ENCOUNTER — Telehealth (INDEPENDENT_AMBULATORY_CARE_PROVIDER_SITE_OTHER): Payer: Medicare Other | Admitting: Family Medicine

## 2020-10-24 DIAGNOSIS — R059 Cough, unspecified: Secondary | ICD-10-CM

## 2020-10-24 DIAGNOSIS — R0981 Nasal congestion: Secondary | ICD-10-CM

## 2020-10-24 MED ORDER — AMOXICILLIN-POT CLAVULANATE 500-125 MG PO TABS: 1.0000 | ORAL_TABLET | Freq: Two times a day (BID) | ORAL | 0 refills | Status: DC

## 2020-10-24 NOTE — Telephone Encounter (Signed)
Spoke with the pts son to offer help as PCP is seeing patients in the office at this time.  Mr [name] stated the pt still has a cough, congestion and has used Mucinex with some relief.  Virtual visit scheduled for today with Dr Maudie Mercury as PCP does not have any openings.

## 2020-10-24 NOTE — Telephone Encounter (Signed)
Patient's son called after hours because his mother has been having congestion and respiratory problems and the Mucinex is not helping. He is only wanting to [employer] to call him back to discuss his mother.  Please advise

## 2020-10-24 NOTE — Progress Notes (Signed)
Virtual Visit via Telephone Note  I connected with [name] on 10/24/20 at 11:40 AM EDT by telephone and verified that I am speaking with the correct person using two identifiers.   I discussed the limitations, risks, security and privacy concerns of performing an evaluation and management service by telephone and the availability of in person appointments. I also discussed with the patient that there may be a patient responsible charge related to this service. The patient expressed understanding and agreed to proceed.  Location patient: home, [place] Location provider: work or home office Participants present for the call: patient, provider Patient did not have a visit with me in the prior 7 days to address this/these issue(s).   History of Present Illness:  Acute telemedicine visit for nasal congestion and Cough: -Onset: for a few months she thinks -Symptoms include: nasal congestion, cough, mucus production has worsened - now the mucus out of the nose is thick and white -Denies: fevers, SOB, CP, wheezing, NVD, weight loss -Has tried: musinex - helps a little, but has not resolved -Pertinent past medical history: denies a history of seasonal allergies, has had history of GERD -Pertinent medication allergies: oxycodone -COVID-19 vaccine status: has had covid vaccines and had covid    Observations/Objective: Patient sounds cheerful and well on the phone. I do not appreciate any SOB. Speech and thought processing are grossly intact. Patient reported vitals:  Assessment and Plan:  Cough  Sinus congestion  -we discussed possible serious and likely etiologies, options for evaluation and workup, limitations of telemedicine visit vs in person visit, treatment, treatment risks and precautions. Pt prefers to treat via telemedicine empirically rather than in person at this moment.  Query sinusitis, possibly secondary to allergic rhinitis or silent reflux, versus other.  Given worsening after  significant duration of time with thick mucus, she opted to try an antibiotic.  Advised nasal saline and 2-week follow-up with her primary care provider to ensure symptoms have improved or resolved. Scheduled follow up with PCP offered:   Sent message to schedulers to assist and advised patient to contact PCP office to schedule if does not receive call back in next 24 hours. Advised to seek prompt in person care if worsening, new symptoms arise, or if is not improving with treatment. Advised of options for inperson care in case PCP office not available. Did let the patient know that I only do telemedicine shifts for [facility] on Tuesdays and Thursdays and advised a follow up visit with PCP or at an [facility] if has further questions or concerns.   Follow Up Instructions:  I did not refer this patient for an OV with me in the next 24 hours for this/these issue(s).  I discussed the assessment and treatment plan with the patient. The patient was provided an opportunity to ask questions and all were answered. The patient agreed with the plan and demonstrated an understanding of the instructions.   I spent 14 minutes on the date of this visit in the care of this patient. See summary of tasks completed to properly care for this patient in the detailed notes above which also included counseling of above, review of PMH, medications, allergies, evaluation of the patient and ordering and/or  instructing patient on testing and care options.     Lucretia Kern, DO

## 2020-10-24 NOTE — Patient Instructions (Signed)
Please schedule a 2-week follow-up visit with your primary care office.  -I sent the medication(s) we discussed to your pharmacy: Meds ordered this encounter  Medications  . amoxicillin-clavulanate (AUGMENTIN) 500-125 MG tablet    Sig: Take 1 tablet (500 mg total) by mouth 2 (two) times daily.    Dispense:  20 tablet    Refill:  0     I hope you are feeling better soon!  Seek in person care promptly if your symptoms worsen, new concerns arise or you are not improving with treatment.  It was nice to meet you today. I help [place] out with telemedicine visits on Tuesdays and Thursdays and am available for visits on those days. If you have any concerns or questions following this visit please schedule a follow up visit with your Primary Care doctor or seek care at a local urgent care clinic to avoid delays in care.

## 2020-10-28 ENCOUNTER — Encounter (INDEPENDENT_AMBULATORY_CARE_PROVIDER_SITE_OTHER): Payer: Medicare Other | Admitting: Ophthalmology

## 2020-10-28 ENCOUNTER — Other Ambulatory Visit: Payer: Self-pay

## 2020-11-04 ENCOUNTER — Encounter (INDEPENDENT_AMBULATORY_CARE_PROVIDER_SITE_OTHER): Payer: Medicare Other | Admitting: Ophthalmology

## 2020-11-07 ENCOUNTER — Ambulatory Visit (INDEPENDENT_AMBULATORY_CARE_PROVIDER_SITE_OTHER): Payer: Medicare Other | Admitting: Adult Health

## 2020-11-07 ENCOUNTER — Encounter: Payer: Self-pay | Admitting: Adult Health

## 2020-11-07 ENCOUNTER — Other Ambulatory Visit: Payer: Self-pay

## 2020-11-07 VITALS — BP 142/50 | HR 63 | Temp 98.2°F | Ht 62.0 in | Wt 163.0 lb

## 2020-11-07 DIAGNOSIS — R0981 Nasal congestion: Secondary | ICD-10-CM

## 2020-11-07 NOTE — Progress Notes (Signed)
Subjective:    Patient ID: [name], female    DOB: 21-Aug-1928, 87 y.o.   MRN: [id number]  HPI 87 year old female who  has a past medical history of Anemia, Chronic fatigue, Chronic renal insufficiency, stage III (moderate) ([place]), Depression, HYPERTENSION (04/04/2007), HYPOTHYROIDISM (04/04/2007), Lewy body dementia ([place]), and Macular degeneration.  She presents to the office today with her son for 2-week follow-up after being seen via virtual visit by another provider.  She was prescribed 10-day course of Augmentin for suspected sinusitis.  She does report that she started taking the medication wrong and instead of taking twice a day she was taken it once a day.  Currently has 3 pills left.  Reports that since starting her antibiotic therapy "I feel better than I have in a long time".  She does continue to have a mild rhinorrhea but all of her other symptoms have resolved.  She denies fevers or chills   Review of Systems See HPI   Past Medical History:  Diagnosis Date  . Anemia   . Chronic fatigue   . Chronic renal insufficiency, stage III (moderate) (HCC)   . Depression   . HYPERTENSION 04/04/2007  . HYPOTHYROIDISM 04/04/2007  . Lewy body dementia ([place])   . Macular degeneration     Social History   Socioeconomic History  . Marital status: Widowed    Spouse name: Not on file  . Number of children: 1  . Years of education: 9th grade  . Highest education level: 9th grade  Occupational History  . Occupation: Retired    Fish farm manager: RETIRED  Tobacco Use  . Smoking status: Former Smoker    Packs/day: 1.00    Years: 7.00    Pack years: 7.00    Types: Cigarettes    Quit date: 10/13/1969    Years since quitting: 51.1  . Smokeless tobacco: Never Used  Vaping Use  . Vaping Use: Never used  Substance and Sexual Activity  . Alcohol use: No  . Drug use: No  . Sexual activity: Not Currently  Other Topics Concern  . Not on file  Social History Narrative   Widowed   1 son    Right handed    Lives alone    Social Determinants of Health   Financial Resource Strain: Low Risk   . Difficulty of Paying Living Expenses: Not hard at all  Food Insecurity: No Food Insecurity  . Worried About Charity fundraiser in the Last Year: Never true  . Ran Out of Food in the Last Year: Never true  Transportation Needs: No Transportation Needs  . Lack of Transportation (Medical): No  . Lack of Transportation (Non-Medical): No  Physical Activity: Inactive  . Days of Exercise per Week: 0 days  . Minutes of Exercise per Session: 0 min  Stress: No Stress Concern Present  . Feeling of Stress : Not at all  Social Connections: Moderately Isolated  . Frequency of Communication with Friends and Family: More than three times a week  . Frequency of Social Gatherings with Friends and Family: More than three times a week  . Attends Religious Services: More than 4 times per year  . Active Member of Clubs or Organizations: No  . Attends Archivist Meetings: Never  . Marital Status: Widowed  Intimate Partner Violence: Not At Risk  . Fear of Current or Ex-Partner: No  . Emotionally Abused: No  . Physically Abused: No  . Sexually Abused: No  Past Surgical History:  Procedure Laterality Date  . ABDOMINAL HYSTERECTOMY    . APPENDECTOMY    . BACK SURGERY  2012  . Cataracts    . CHOLECYSTECTOMY    . ERCP N/A 11/22/2014   Procedure: ENDOSCOPIC RETROGRADE CHOLANGIOPANCREATOGRAPHY (ERCP);  Surgeon: Clarene Essex, MD;  Location: [facility] ENDOSCOPY;  Service: Endoscopy;  Laterality: N/A;  . HERNIA REPAIR    . OOPHORECTOMY    . ORIF TIBIA FRACTURE  2012   dr duda  . SPINE SURGERY  2011   cervical    Family History  Problem Relation Age of Onset  . Kidney cancer Mother 58       renal cell CA  . Heart disease Father   . Lymphoma Son   . Breast cancer Daughter     Allergies  Allergen Reactions  . Oxycodone Hcl     REACTION: itching    Current Outpatient Medications on File  Prior to Visit  Medication Sig Dispense Refill  . albuterol (PROAIR HFA) 108 (90 Base) MCG/ACT inhaler 1-2 puffs every 6 hours as needed. 8 g 2  . amLODipine (NORVASC) 10 MG tablet TAKE 1 TABLET BY MOUTH  DAILY 90 tablet 3  . cetirizine (ZYRTEC) 10 MG tablet Take 10 mg by mouth daily.    Marland Kitchen donepezil (ARICEPT) 10 MG tablet Take 1 tablet daily 90 tablet 3  . levothyroxine (SYNTHROID) 150 MCG tablet TAKE 1 TABLET BY MOUTH  DAILY 90 tablet 3  . Multiple Vitamin (MULTIVITAMIN) tablet Take 1 tablet by mouth daily.    . Multiple Vitamins-Minerals (PRESERVISION AREDS 2 PO) Take by mouth.    . olmesartan (BENICAR) 20 MG tablet TAKE 1 TABLET BY MOUTH  DAILY 90 tablet 3  . omeprazole (PRILOSEC) 40 MG capsule TAKE 1 CAPSULE BY MOUTH  DAILY 90 capsule 3  . QUEtiapine (SEROQUEL) 25 MG tablet TAKE 1  TABLET IN THE EVENING 90 tablet 3  . sertraline (ZOLOFT) 25 MG tablet TAKE 1 TABLET BY MOUTH AT  BEDTIME 90 tablet 3   No current facility-administered medications on file prior to visit.    BP (!) 142/50 (BP Location: Left Arm, Patient Position: Sitting, Cuff Size: Normal)   Pulse 63   Temp 98.2 F (36.8 C) (Oral)   Ht 5\' 2"  (1.575 m)   Wt 163 lb (73.9 kg)   SpO2 96%   BMI 29.81 kg/m       Objective:   Physical Exam Vitals and nursing note reviewed.  Constitutional:      Appearance: Normal appearance.  HENT:     Nose: Nose normal. No congestion or rhinorrhea.     Mouth/Throat:     Mouth: Mucous membranes are moist.     Pharynx: No oropharyngeal exudate.  Cardiovascular:     Rate and Rhythm: Normal rate and regular rhythm.     Pulses: Normal pulses.     Heart sounds: Normal heart sounds.  Pulmonary:     Effort: Pulmonary effort is normal.     Breath sounds: Normal breath sounds.  Musculoskeletal:        General: Normal range of motion.  Skin:    General: Skin is warm and dry.  Neurological:     General: No focal deficit present.     Mental Status: She is alert and oriented to person,  place, and time.  Psychiatric:        Mood and Affect: Mood normal.        Behavior: Behavior normal.  Thought Content: Thought content normal.        Judgment: Judgment normal.       Assessment & Plan:  1. Sinus congestion -No signs of sinusitis today.  Encouraged to finish her antibiotics.  Follow-up as needed  Dorothyann Peng, NP

## 2020-11-18 ENCOUNTER — Telehealth: Payer: Self-pay | Admitting: Adult Health

## 2020-11-18 NOTE — Telephone Encounter (Signed)
Pt condition has not improved. She still has sinus congestion pt requesting more antibiotics.

## 2020-11-19 ENCOUNTER — Other Ambulatory Visit: Payer: Self-pay | Admitting: Adult Health

## 2020-11-19 MED ORDER — AMOXICILLIN-POT CLAVULANATE 875-125 MG PO TABS: 1.0000 | ORAL_TABLET | Freq: Two times a day (BID) | ORAL | 0 refills | Status: DC

## 2020-11-19 NOTE — Telephone Encounter (Signed)
Spoke with the pt and informed her of the message below.   

## 2020-11-19 NOTE — Telephone Encounter (Signed)
Addition week of Augmentin sent to pharmacy

## 2020-11-25 ENCOUNTER — Telehealth: Payer: Self-pay | Admitting: Adult Health

## 2020-11-25 NOTE — Telephone Encounter (Signed)
The patients son Alec Mcphee [facility] called in reference to the patient taking sertraline (ZOLOFT) 25 MG tablet the directions say to take it at bedtime but she is taking it during the day and the pharmacy hasn't refilled it so he was wondering if you are still wanting her to keep taking this medication if not he needs it taken off of her list.   She is also taking QUEtiapine (SEROQUEL) 25 MG tablet at bedtime but the instructions state for her to take 1 table in the evening.He was told that she's supposed to be taking that at bedtime to help her sleep from Dr. Delice Lesch.    [place], [address], [address] Phone:  [phone]  Fax:  [phone]

## 2020-11-26 NOTE — Telephone Encounter (Signed)
Left a detailed message at the pts son's cell number with the information below.

## 2020-11-26 NOTE — Telephone Encounter (Signed)
I do want her to continue with Zoloft - she can take this either day or night. It will not make her drowsy. She has refills at the pharmacy   Seroquel should be taken in the evening prior to bed

## 2020-12-03 ENCOUNTER — Other Ambulatory Visit: Payer: Self-pay

## 2020-12-03 ENCOUNTER — Encounter (INDEPENDENT_AMBULATORY_CARE_PROVIDER_SITE_OTHER): Payer: Medicare Other | Admitting: Ophthalmology

## 2020-12-03 DIAGNOSIS — H35033 Hypertensive retinopathy, bilateral: Secondary | ICD-10-CM | POA: Diagnosis not present

## 2020-12-03 DIAGNOSIS — H353132 Nonexudative age-related macular degeneration, bilateral, intermediate dry stage: Secondary | ICD-10-CM

## 2020-12-03 DIAGNOSIS — H43813 Vitreous degeneration, bilateral: Secondary | ICD-10-CM

## 2020-12-03 DIAGNOSIS — D3132 Benign neoplasm of left choroid: Secondary | ICD-10-CM

## 2020-12-03 DIAGNOSIS — I10 Essential (primary) hypertension: Secondary | ICD-10-CM

## 2020-12-17 ENCOUNTER — Other Ambulatory Visit: Payer: Self-pay

## 2020-12-18 ENCOUNTER — Ambulatory Visit (INDEPENDENT_AMBULATORY_CARE_PROVIDER_SITE_OTHER): Payer: Medicare Other | Admitting: Adult Health

## 2020-12-18 VITALS — BP 150/74 | HR 62 | Temp 98.6°F | Ht 62.0 in | Wt 163.3 lb

## 2020-12-18 DIAGNOSIS — M67449 Ganglion, unspecified hand: Secondary | ICD-10-CM

## 2020-12-18 DIAGNOSIS — R5383 Other fatigue: Secondary | ICD-10-CM | POA: Diagnosis not present

## 2020-12-18 NOTE — Progress Notes (Signed)
Subjective:    Patient ID: [name], female    DOB: [birth year], 87 y.o.   MRN: [id number]  HPI 87 year old female who  has a past medical history of Anemia, Chronic fatigue, Chronic renal insufficiency, stage III (moderate) ([place]), Depression, HYPERTENSION (04/04/2007), HYPOTHYROIDISM (04/04/2007), Lewy body dementia ([place]), and Macular degeneration.  She presents to the office today with a complaint of "I just do not feel good, I have no energy".  Her symptoms have been present the last 3 to 4 days.  She denies any other symptoms such as fevers, chills, chest pain, shortness of breath, nausea, vomiting, diarrhea.  Does report that about a week ago she increased her Aricept from a half a tab to a full 10 mg tab, and remembers this happening previously when she went up on the dose.  Additionally, she reports feeling a "lump" on the palmar surface of her right index finger.  First noticed this a couple of days ago at which time the finger was swollen but this seems to be improving over the course of the week    Review of Systems See HPI   Past Medical History:  Diagnosis Date  . Anemia   . Chronic fatigue   . Chronic renal insufficiency, stage III (moderate) (HCC)   . Depression   . HYPERTENSION 04/04/2007  . HYPOTHYROIDISM 04/04/2007  . Lewy body dementia ([place])   . Macular degeneration     Social History   Socioeconomic History  . Marital status: Widowed    Spouse name: Not on file  . Number of children: 1  . Years of education: 9th grade  . Highest education level: 9th grade  Occupational History  . Occupation: Retired    Fish farm manager: RETIRED  Tobacco Use  . Smoking status: Former Smoker    Packs/day: 1.00    Years: 7.00    Pack years: 7.00    Types: Cigarettes    Quit date: 10/13/1969    Years since quitting: 51.2  . Smokeless tobacco: Never Used  Vaping Use  . Vaping Use: Never used  Substance and Sexual Activity  . Alcohol use: No  . Drug use: No  . Sexual activity:  Not Currently  Other Topics Concern  . Not on file  Social History Narrative   Widowed   1 son   Right handed    Lives alone    Social Determinants of Health   Financial Resource Strain: Low Risk   . Difficulty of Paying Living Expenses: Not hard at all  Food Insecurity: No Food Insecurity  . Worried About Charity fundraiser in the Last Year: Never true  . Ran Out of Food in the Last Year: Never true  Transportation Needs: No Transportation Needs  . Lack of Transportation (Medical): No  . Lack of Transportation (Non-Medical): No  Physical Activity: Inactive  . Days of Exercise per Week: 0 days  . Minutes of Exercise per Session: 0 min  Stress: No Stress Concern Present  . Feeling of Stress : Not at all  Social Connections: Moderately Isolated  . Frequency of Communication with Friends and Family: More than three times a week  . Frequency of Social Gatherings with Friends and Family: More than three times a week  . Attends Religious Services: More than 4 times per year  . Active Member of Clubs or Organizations: No  . Attends Archivist Meetings: Never  . Marital Status: Widowed  Intimate Partner Violence: Not At  Risk  . Fear of Current or Ex-Partner: No  . Emotionally Abused: No  . Physically Abused: No  . Sexually Abused: No    Past Surgical History:  Procedure Laterality Date  . ABDOMINAL HYSTERECTOMY    . APPENDECTOMY    . BACK SURGERY  2012  . Cataracts    . CHOLECYSTECTOMY    . ERCP N/A 11/22/2014   Procedure: ENDOSCOPIC RETROGRADE CHOLANGIOPANCREATOGRAPHY (ERCP);  Surgeon: Clarene Essex, MD;  Location: [facility] ENDOSCOPY;  Service: Endoscopy;  Laterality: N/A;  . HERNIA REPAIR    . OOPHORECTOMY    . ORIF TIBIA FRACTURE  2012   dr duda  . SPINE SURGERY  2011   cervical    Family History  Problem Relation Age of Onset  . Kidney cancer Mother 43       renal cell CA  . Heart disease Father   . Lymphoma Son   . Breast cancer Daughter     Allergies   Allergen Reactions  . Oxycodone Hcl     REACTION: itching    Current Outpatient Medications on File Prior to Visit  Medication Sig Dispense Refill  . albuterol (PROAIR HFA) 108 (90 Base) MCG/ACT inhaler 1-2 puffs every 6 hours as needed. 8 g 2  . amLODipine (NORVASC) 10 MG tablet TAKE 1 TABLET BY MOUTH  DAILY 90 tablet 3  . amoxicillin-clavulanate (AUGMENTIN) 875-125 MG tablet Take 1 tablet by mouth 2 (two) times daily. 14 tablet 0  . cetirizine (ZYRTEC) 10 MG tablet Take 10 mg by mouth daily.    Marland Kitchen donepezil (ARICEPT) 10 MG tablet Take 1 tablet daily 90 tablet 3  . levothyroxine (SYNTHROID) 150 MCG tablet TAKE 1 TABLET BY MOUTH  DAILY 90 tablet 3  . Multiple Vitamin (MULTIVITAMIN) tablet Take 1 tablet by mouth daily.    . Multiple Vitamins-Minerals (PRESERVISION AREDS 2 PO) Take by mouth.    . olmesartan (BENICAR) 20 MG tablet TAKE 1 TABLET BY MOUTH  DAILY 90 tablet 3  . omeprazole (PRILOSEC) 40 MG capsule TAKE 1 CAPSULE BY MOUTH  DAILY 90 capsule 3  . QUEtiapine (SEROQUEL) 25 MG tablet TAKE 1  TABLET IN THE EVENING 90 tablet 3  . sertraline (ZOLOFT) 25 MG tablet TAKE 1 TABLET BY MOUTH AT  BEDTIME 90 tablet 3   No current facility-administered medications on file prior to visit.    BP (!) 150/74 (BP Location: Left Arm, Patient Position: Sitting, Cuff Size: Normal)   Pulse 62   Temp 98.6 F (37 C) (Oral)   Ht 5\' 2"  (1.575 m)   Wt 163 lb 4.8 oz (74.1 kg)   SpO2 97%   BMI 29.87 kg/m       Objective:   Physical Exam Vitals and nursing note reviewed.  Constitutional:      Appearance: Normal appearance.  Cardiovascular:     Rate and Rhythm: Normal rate and regular rhythm.     Pulses: Normal pulses.     Heart sounds: Normal heart sounds.  Pulmonary:     Effort: Pulmonary effort is normal.     Breath sounds: Normal breath sounds.  Musculoskeletal:        General: Normal range of motion.  Skin:    General: Skin is warm and dry.     Capillary Refill: Capillary refill takes  less than 2 seconds.     Comments: Small cyst felt medial to MCP joint on palmar surface of right index finger.  No acute swelling, redness, or warmth noted  Neurological:     General: No focal deficit present.     Mental Status: She is alert and oriented to person, place, and time.  Psychiatric:        Mood and Affect: Mood normal.        Behavior: Behavior normal.        Thought Content: Thought content normal.        Judgment: Judgment normal.       Assessment & Plan:  1. Fatigue, unspecified type -Possibly medication related, she can go back to trying half tab of Aricept nightly to see if her symptoms improve.  She did not want to have blood work done today, and will follow-up if her symptoms do not improve over the next week  2. Ganglion cyst of finger - advised watchful waiting.   Dorothyann Peng, NP Time spent on chart review, time with patient; discussion of fatigue and cyst, home monitoring ,treatment, follow up plan, and documentation 30 minutes

## 2021-02-25 ENCOUNTER — Encounter: Payer: Self-pay | Admitting: Adult Health

## 2021-02-25 ENCOUNTER — Encounter (HOSPITAL_BASED_OUTPATIENT_CLINIC_OR_DEPARTMENT_OTHER): Payer: Self-pay | Admitting: Emergency Medicine

## 2021-02-25 ENCOUNTER — Telehealth (INDEPENDENT_AMBULATORY_CARE_PROVIDER_SITE_OTHER): Payer: Medicare Other | Admitting: Adult Health

## 2021-02-25 ENCOUNTER — Other Ambulatory Visit: Payer: Self-pay

## 2021-02-25 ENCOUNTER — Emergency Department (HOSPITAL_BASED_OUTPATIENT_CLINIC_OR_DEPARTMENT_OTHER)
Admission: EM | Admit: 2021-02-25 | Discharge: 2021-02-25 | Disposition: A | Discharge status: Home / Self Care | Payer: Medicare Other | Attending: Emergency Medicine | Admitting: Emergency Medicine

## 2021-02-25 VITALS — Ht 62.0 in | Wt 163.0 lb

## 2021-02-25 DIAGNOSIS — K921 Melena: Secondary | ICD-10-CM | POA: Diagnosis not present

## 2021-02-25 DIAGNOSIS — R42 Dizziness and giddiness: Secondary | ICD-10-CM | POA: Diagnosis not present

## 2021-02-25 DIAGNOSIS — R2681 Unsteadiness on feet: Secondary | ICD-10-CM | POA: Diagnosis not present

## 2021-02-25 DIAGNOSIS — Z5321 Procedure and treatment not carried out due to patient leaving prior to being seen by health care provider: Secondary | ICD-10-CM | POA: Insufficient documentation

## 2021-02-25 DIAGNOSIS — R2689 Other abnormalities of gait and mobility: Secondary | ICD-10-CM | POA: Insufficient documentation

## 2021-02-25 DIAGNOSIS — R531 Weakness: Secondary | ICD-10-CM | POA: Insufficient documentation

## 2021-02-25 DIAGNOSIS — R5383 Other fatigue: Secondary | ICD-10-CM

## 2021-02-25 LAB — CBC
HCT: 32.6 % — ABNORMAL LOW (ref 36.0–46.0)
Hemoglobin: 11 g/dL — ABNORMAL LOW (ref 12.0–15.0)
MCH: 30.6 pg (ref 26.0–34.0)
MCHC: 33.7 g/dL (ref 30.0–36.0)
MCV: 90.6 fL (ref 80.0–100.0)
Platelets: 306 10*3/uL (ref 150–400)
RBC: 3.6 MIL/uL — ABNORMAL LOW (ref 3.87–5.11)
RDW: 12.6 % (ref 11.5–15.5)
WBC: 7.5 10*3/uL (ref 4.0–10.5)
nRBC: 0 % (ref 0.0–0.2)

## 2021-02-25 LAB — COMPREHENSIVE METABOLIC PANEL
ALT: 14 U/L (ref 0–44)
AST: 15 U/L (ref 15–41)
Albumin: 4.4 g/dL (ref 3.5–5.0)
Alkaline Phosphatase: 75 U/L (ref 38–126)
Anion gap: 9 (ref 5–15)
BUN: 23 mg/dL (ref 8–23)
CO2: 24 mmol/L (ref 22–32)
Calcium: 9 mg/dL (ref 8.9–10.3)
Chloride: 100 mmol/L (ref 98–111)
Creatinine, Ser: 1.36 mg/dL — ABNORMAL HIGH (ref 0.44–1.00)
GFR, Estimated: 37 mL/min — ABNORMAL LOW (ref >60)
Glucose, Bld: 89 mg/dL (ref 70–99)
Potassium: 4.7 mmol/L (ref 3.5–5.1)
Sodium: 133 mmol/L — ABNORMAL LOW (ref 135–145)
Total Bilirubin: 0.5 mg/dL (ref 0.3–1.2)
Total Protein: 7.4 g/dL (ref 6.5–8.1)

## 2021-02-25 NOTE — Progress Notes (Signed)
Virtual Visit via Telephone Note  I connected with [name] on 02/25/21 at  4:30 PM EDT by telephone and verified that I am speaking with the correct person using two identifiers.   I discussed the limitations, risks, security and privacy concerns of performing an evaluation and management service by telephone and the availability of in person appointments. I also discussed with the patient that there may be a patient responsible charge related to this service. The patient expressed understanding and agreed to proceed.  Location patient: home Location provider: work or home office Participants present for the call: patient, provider, son Patient did not have a visit in the prior 7 days to address this/these issue(s).   History of Present Illness:   87 year old female who  has a past medical history of Anemia, Chronic fatigue, Chronic renal insufficiency, stage III (moderate) ([place]), Depression, HYPERTENSION (04/04/2007), HYPOTHYROIDISM (04/04/2007), Lewy body dementia ([place]), and Macular degeneration.  She is being evaluated today via phone for multiple complaints.  Her son who is the main caregiver is also on the phone today.  She reports that her symptoms started roughly a month ago but have gotten worse over the last week.  Symptoms include increased weakness past her baseline, staggering gait, near falls, dizziness, " stammering when I talk", worsening memory loss, and black tarry stools.  She has not been taking Pepto.  Reports her symptoms have "come on quickly over the last week".  She denies nausea, vomiting, fevers, chills, or falls. No diarrhea and no constipation    Observations/Objective: Patient sounds cheerful and well on the phone. I do not appreciate any SOB. Speech and thought processing are grossly intact. Patient reported vitals:  Assessment and Plan: With her symptoms, I think she needs further evaluation as it sounds as though she does have a GI bleed.  Advised to go to  the emergency room, her son will take her right now via private vehicle.  Follow Up Instructions:   I did not refer this patient for an OV in the next 24 hours for this/these issue(s).  I discussed the assessment and treatment plan with the patient. The patient was provided an opportunity to ask questions and all were answered. The patient agreed with the plan and demonstrated an understanding of the instructions.   The patient was advised to call back or seek an in-person evaluation if the symptoms worsen or if the condition fails to improve as anticipated.  I provided 21 minutes of non-face-to-face time during this encounter.   Dorothyann Peng, NP

## 2021-02-25 NOTE — ED Notes (Signed)
Pt advised this RN that she wanted to leave and asked to have her IV taken out. Pt encouraged to stay and advised that without seeing the Dr. her condition could worsen. Pt states she would f/u with here PCP in the AM.

## 2021-02-25 NOTE — ED Triage Notes (Signed)
Pt arrives to ED with c/o of weakness, abnormal gait, black tarry stools, and dizziness for a month. This week however, her symptoms worsened. Pt denies SOB and CP. Pt BP in 190's during triage.

## 2021-02-25 NOTE — ED Notes (Signed)
RN attempted to calm down patient who was very agitated at wait times. RN explained we checked her lab values, and while I cannot give results of blood work, if there was any sign of increase in her acuity or extremely concerning lab values we would get her back as soon as possible. Patient states that she needs a blood transfusion, RN explained that we checked her hemoglobin levels and if there was a concern for needing a blood transfusion we would get her back. Patient irritated at this time. Her son is encouraging her to stay as is this Therapist, sports. Patient states she"will just have to wait like everyone else". Patient given a warm blanket.

## 2021-02-26 ENCOUNTER — Telehealth: Payer: Self-pay | Admitting: Adult Health

## 2021-02-26 NOTE — Telephone Encounter (Signed)
The patient wants [name] to call her. It is important that she speaks with [name].

## 2021-02-27 NOTE — Telephone Encounter (Signed)
Pt called and wanted [name] to read over lab work from ED.

## 2021-03-10 ENCOUNTER — Telehealth: Payer: Self-pay | Admitting: Adult Health

## 2021-03-10 NOTE — Telephone Encounter (Signed)
Patient son [name] calling wanting to know if [name] can call and discuss the lab results that was done at the ED on 02/25/21. Please call [phone].

## 2021-03-11 NOTE — Telephone Encounter (Signed)
Left message to return phone call.

## 2021-03-11 NOTE — Telephone Encounter (Signed)
Please advise 

## 2021-03-12 NOTE — Telephone Encounter (Signed)
[name] notified of update. No further action needed.

## 2021-03-15 ENCOUNTER — Other Ambulatory Visit: Payer: Self-pay | Admitting: Adult Health

## 2021-03-15 DIAGNOSIS — I10 Essential (primary) hypertension: Secondary | ICD-10-CM

## 2021-03-16 ENCOUNTER — Other Ambulatory Visit: Payer: Self-pay | Admitting: Adult Health

## 2021-03-16 DIAGNOSIS — I10 Essential (primary) hypertension: Secondary | ICD-10-CM

## 2021-04-03 ENCOUNTER — Telehealth: Payer: Self-pay | Admitting: Adult Health

## 2021-04-03 NOTE — Telephone Encounter (Signed)
Pt's son [name] is wanting a referral for his mom to get a mammogram. Please advise [name] at [phone]

## 2021-04-03 NOTE — Telephone Encounter (Signed)
Called [name] and he stated that pt called him early in the morning asking for the mammogram but he is not sure why she is asking for a mammogram. [name] advised me to call the pt home phone to see why it was needed. Called pt but no answer or ring. Will try again later.

## 2021-04-04 NOTE — Telephone Encounter (Signed)
Son of patient returned call and requested a referral for mammogram, patient fell and injured her breast. Informed the son appt would be needed for referral and appt was scheduled for 9/2

## 2021-04-04 NOTE — Telephone Encounter (Signed)
Noted. FYI 

## 2021-04-09 ENCOUNTER — Other Ambulatory Visit: Payer: Self-pay

## 2021-04-09 ENCOUNTER — Encounter: Payer: Self-pay | Admitting: Neurology

## 2021-04-09 ENCOUNTER — Ambulatory Visit (INDEPENDENT_AMBULATORY_CARE_PROVIDER_SITE_OTHER): Payer: Medicare Other | Admitting: Neurology

## 2021-04-09 VITALS — BP 205/66 | HR 63 | Ht 62.5 in | Wt 165.4 lb

## 2021-04-09 DIAGNOSIS — F02818 Dementia in other diseases classified elsewhere, unspecified severity, with other behavioral disturbance: Secondary | ICD-10-CM

## 2021-04-09 DIAGNOSIS — F0281 Dementia in other diseases classified elsewhere with behavioral disturbance: Secondary | ICD-10-CM

## 2021-04-09 DIAGNOSIS — G3183 Dementia with Lewy bodies: Secondary | ICD-10-CM

## 2021-04-09 MED ORDER — QUETIAPINE FUMARATE 50 MG PO TABS: 50.0000 mg | ORAL_TABLET | Freq: Every day | ORAL | 3 refills | Status: DC

## 2021-04-09 MED ORDER — DONEPEZIL HCL 10 MG PO TABS: ORAL_TABLET | ORAL | 3 refills | Status: DC

## 2021-04-09 NOTE — Patient Instructions (Signed)
Take Seroquel (Quetiapine) '50mg'$ : 1 tablet every night  2. Continue Donepezil '10mg'$  daily  3. Continue close supervision  4. Follow-up in 6 months, call for any changes   FALL PRECAUTIONS: Be cautious when walking. Scan the area for obstacles that may increase the risk of trips and falls. When getting up in the mornings, sit up at the edge of the bed for a few minutes before getting out of bed. Consider elevating the bed at the head end to avoid drop of blood pressure when getting up. Walk always in a well-lit room (use night lights in the walls). Avoid area rugs or power cords from appliances in the middle of the walkways. Use a walker or a cane if necessary and consider physical therapy for balance exercise. Get your eyesight checked regularly.  FINANCIAL OVERSIGHT: Supervision, especially oversight when making financial decisions or transactions is also recommended.  HOME SAFETY: Consider the safety of the kitchen when operating appliances like stoves, microwave oven, and blender. Consider having supervision and share cooking responsibilities until no longer able to participate in those. Accidents with firearms and other hazards in the house should be identified and addressed as well.  ABILITY TO BE LEFT ALONE: If patient is unable to contact 911 operator, consider using LifeLine, or when the need is there, arrange for someone to stay with patients. Smoking is a fire hazard, consider supervision or cessation. Risk of wandering should be assessed by caregiver and if detected at any point, supervision and safe proof recommendations should be instituted.  MEDICATION SUPERVISION: Inability to self-administer medication needs to be constantly addressed. Implement a mechanism to ensure safe administration of the medications.  RECOMMENDATIONS FOR ALL PATIENTS WITH MEMORY PROBLEMS: 1. Continue to exercise (Recommend 30 minutes of walking everyday, or 3 hours every week) 2. Increase social interactions  - continue going to [place] and enjoy social gatherings with friends and family 3. Eat healthy, avoid fried foods and eat more fruits and vegetables 4. Maintain adequate blood pressure, blood sugar, and blood cholesterol level. Reducing the risk of stroke and cardiovascular disease also helps promoting better memory. 5. Avoid stressful situations. Live a simple life and avoid aggravations. Organize your time and prepare for the next day in anticipation. 6. Sleep well, avoid any interruptions of sleep and avoid any distractions in the bedroom that may interfere with adequate sleep quality 7. Avoid sugar, avoid sweets as there is a strong link between excessive sugar intake, diabetes, and cognitive impairment The Mediterranean diet has been shown to help patients reduce the risk of progressive memory disorders and reduces cardiovascular risk. This includes eating fish, eat fruits and green leafy vegetables, nuts like almonds and hazelnuts, walnuts, and also use olive oil. Avoid fast foods and fried foods as much as possible. Avoid sweets and sugar as sugar use has been linked to worsening of memory function.  There is always a concern of gradual progression of memory problems. If this is the case, then we may need to adjust level of care according to patient needs. Support, both to the patient and caregiver, should then be put into place.
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
## 2021-04-11 ENCOUNTER — Other Ambulatory Visit: Payer: Self-pay

## 2021-04-11 ENCOUNTER — Ambulatory Visit (INDEPENDENT_AMBULATORY_CARE_PROVIDER_SITE_OTHER): Payer: Medicare Other | Admitting: Adult Health

## 2021-04-11 VITALS — BP 140/75 | HR 60 | Temp 98.5°F | Resp 18 | Wt 164.0 lb

## 2021-04-11 DIAGNOSIS — R195 Other fecal abnormalities: Secondary | ICD-10-CM

## 2021-04-11 DIAGNOSIS — N6310 Unspecified lump in the right breast, unspecified quadrant: Secondary | ICD-10-CM | POA: Diagnosis not present

## 2021-04-11 NOTE — Progress Notes (Signed)
Subjective:    Patient ID: [name], female    DOB: 1929/04/10, 87 y.o.   MRN: [id number]  HPI 87 year old female who  has a past medical history of Anemia, Chronic fatigue, Chronic renal insufficiency, stage III (moderate) ([place]), Depression, HYPERTENSION (04/04/2007), HYPOTHYROIDISM (04/04/2007), Lewy body dementia ([place]), and Macular degeneration.  He presents to the office today with her son for multiple issues   Dark Stools  - this has been on ongoing issue.Was last evaluated in July via tele visit and was advised to go to the ER, which she did but left AMA. She reports that it went away but over the last few weeks the black tarry stools have come back, this is happening every time she has a bowel movement. Denies abdominal cramping. Not taking Pepto   2. She was working in the yard and fell between two bushes  injuring her right arm, right side and right breast She has noticed a lump in her right breast that she did not notice prior to the fall.  Lump is tender to touch.  Reports that pain to her right arm and right side has resolved  Review of Systems See HPI   Past Medical History:  Diagnosis Date   Anemia    Chronic fatigue    Chronic renal insufficiency, stage III (moderate) ([place])    Depression    HYPERTENSION 04/04/2007   HYPOTHYROIDISM 04/04/2007   Lewy body dementia ([place])    Macular degeneration     Social History   Socioeconomic History   Marital status: Widowed    Spouse name: Not on file   Number of children: 1   Years of education: 9th grade   Highest education level: 9th grade  Occupational History   Occupation: Retired    Fish farm manager: RETIRED  Tobacco Use   Smoking status: Former    Packs/day: 1.00    Years: 7.00    Pack years: 7.00    Types: Cigarettes    Quit date: 10/13/1969    Years since quitting: 51.5   Smokeless tobacco: Never  Vaping Use   Vaping Use: Never used  Substance and Sexual Activity   Alcohol use: No   Drug use: No   Sexual  activity: Not Currently  Other Topics Concern   Not on file  Social History Narrative   Widowed   1 son   Right handed    Lives alone    Social Determinants of Health   Financial Resource Strain: Low Risk    Difficulty of Paying Living Expenses: Not hard at all  Food Insecurity: No Food Insecurity   Worried About Charity fundraiser in the Last Year: Never true   [place] in the Last Year: Never true  Transportation Needs: No Transportation Needs   Lack of Transportation (Medical): No   Lack of Transportation (Non-Medical): No  Physical Activity: Inactive   Days of Exercise per Week: 0 days   Minutes of Exercise per Session: 0 min  Stress: No Stress Concern Present   Feeling of Stress : Not at all  Social Connections: Moderately Isolated   Frequency of Communication with Friends and Family: More than three times a week   Frequency of Social Gatherings with Friends and Family: More than three times a week   Attends Religious Services: More than 4 times per year   Active Member of [employer] or Organizations: No   Attends Archivist Meetings: Never   Marital  Status: Widowed  Human resources officer Violence: Not At Risk   Fear of Current or Ex-Partner: No   Emotionally Abused: No   Physically Abused: No   Sexually Abused: No    Past Surgical History:  Procedure Laterality Date   ABDOMINAL HYSTERECTOMY     APPENDECTOMY     BACK SURGERY  2012   Cataracts     CHOLECYSTECTOMY     ERCP N/A 11/22/2014   Procedure: ENDOSCOPIC RETROGRADE CHOLANGIOPANCREATOGRAPHY (ERCP);  Surgeon: Clarene Essex, MD;  Location: [facility] ENDOSCOPY;  Service: Endoscopy;  Laterality: N/A;   HERNIA REPAIR     OOPHORECTOMY     ORIF TIBIA FRACTURE  2012   dr Sharol Given   SPINE SURGERY  2011   cervical    Family History  Problem Relation Age of Onset   Kidney cancer Mother 70       renal cell CA   Heart disease Father    Lymphoma Son    Breast cancer Daughter     Allergies  Allergen Reactions    Oxycodone Hcl     REACTION: itching    Current Outpatient Medications on File Prior to Visit  Medication Sig Dispense Refill   amLODipine (NORVASC) 10 MG tablet TAKE 1 TABLET BY MOUTH  DAILY 30 tablet 1   donepezil (ARICEPT) 10 MG tablet Take 1 tablet daily 90 tablet 3   levothyroxine (SYNTHROID) 150 MCG tablet TAKE 1 TABLET BY MOUTH  DAILY 30 tablet 1   Multiple Vitamin (MULTIVITAMIN) tablet Take 1 tablet by mouth daily.     Multiple Vitamins-Minerals (PRESERVISION AREDS 2 PO) Take by mouth.     olmesartan (BENICAR) 20 MG tablet TAKE 1 TABLET BY MOUTH  DAILY 30 tablet 1   QUEtiapine (SEROQUEL) 50 MG tablet Take 1 tablet (50 mg total) by mouth at bedtime. 90 tablet 3   sertraline (ZOLOFT) 25 MG tablet TAKE 1 TABLET BY MOUTH AT  BEDTIME 90 tablet 3   omeprazole (PRILOSEC) 40 MG capsule TAKE 1 CAPSULE BY MOUTH  DAILY (Patient not taking: Reported on 04/11/2021) 30 capsule 1   No current facility-administered medications on file prior to visit.    BP 140/75 (BP Location: Left Arm, Patient Position: Sitting, Cuff Size: Normal)   Pulse 60   Temp 98.5 F (36.9 C) (Oral)   Resp 18   Wt 164 lb (74.4 kg)   SpO2 96%   BMI 29.52 kg/m       Objective:   Physical Exam Vitals and nursing note reviewed. Exam conducted with a chaperone present.  Constitutional:      Appearance: Normal appearance.  Cardiovascular:     Rate and Rhythm: Normal rate and regular rhythm.     Pulses: Normal pulses.  Pulmonary:     Effort: Pulmonary effort is normal.     Breath sounds: Normal breath sounds.  Chest:    Abdominal:     General: Abdomen is flat. Bowel sounds are normal.     Palpations: Abdomen is soft.  Genitourinary:    Rectum: Guaiac result negative.  Musculoskeletal:        General: Normal range of motion.  Skin:    General: Skin is warm and dry.     Capillary Refill: Capillary refill takes less than 2 seconds.  Neurological:     General: No focal deficit present.     Mental Status: She  is alert and oriented to person, place, and time.  Psychiatric:        Mood and  Affect: Mood normal.        Behavior: Behavior normal.        Thought Content: Thought content normal.        Judgment: Judgment normal.      Assessment & Plan:  1. Mass of right breast -She has a pea-sized hard movable mass noted at the 7 o'clock position of right breast.  We discussed mammogram, but she would like to hold off on this for the next month to see if the mass goes away.  She understands the risks, and being that she is 87 years old she likely would not do anything if she did have breast cancer.  2. Dark stools -He brought a stool sample in today, from this morning.  Stool was dark brown and formed.  Reports that is consistent with the stools that she has had in the past.  Back was negative.  Reassurance given  Dorothyann Peng, NP

## 2021-05-30 ENCOUNTER — Encounter: Payer: Self-pay | Admitting: Family Medicine

## 2021-05-30 ENCOUNTER — Telehealth (INDEPENDENT_AMBULATORY_CARE_PROVIDER_SITE_OTHER): Payer: Medicare Other | Admitting: Family Medicine

## 2021-05-30 DIAGNOSIS — Z20822 Contact with and (suspected) exposure to covid-19: Secondary | ICD-10-CM | POA: Diagnosis not present

## 2021-05-30 DIAGNOSIS — B349 Viral infection, unspecified: Secondary | ICD-10-CM

## 2021-05-30 DIAGNOSIS — R413 Other amnesia: Secondary | ICD-10-CM

## 2021-05-30 MED ORDER — MOLNUPIRAVIR EUA 200MG CAPSULE: 4.0000 | ORAL_CAPSULE | Freq: Two times a day (BID) | ORAL | 0 refills | Status: AC

## 2021-05-30 NOTE — Progress Notes (Signed)
Virtual Visit via Telephone Note  I connected with [name] on 05/30/21 at  2:00 PM EDT by telephone and verified that I am speaking with the correct person using two identifiers.   I discussed the limitations, risks, security and privacy concerns of performing an evaluation and management service by telephone and the availability of in person appointments. I also discussed with the patient that there may be a patient responsible charge related to this service. The patient expressed understanding and agreed to proceed.  Location patient: home Location provider: work or home office Participants present for the call: patient, provider Patient did not have a visit in the prior 7 days to address this/these issue(s).  Chief Complaint  Patient presents with   Covid Exposure    Sore throat, runny nose and now head feels funny. States she feels cool and does not have a fever. Also states she just feels rotten.    History of Present Illness: Pt is a 87 yo female with pmh sig for HTN, hypothyroidism, dementia, CKD 3, eczema, h/o depression who is seen by Dorothyann Peng, NP.  Pt woke up this am with HA, rhinorrhea, malaise, fatigue, mild cough, decreased appetite.  Felt hot this am, but does not have a thermometer. Denies sore throat, diarrhea, n/v, ear pain/pressure.    Sick contacts include a friend on Tuesday who has [place].   Pt's son Lorien Shingler [phone].    Observations/Objective: Patient sounds cheerful and well on the phone. I do not appreciate any SOB. Speech and thought processing are grossly intact. Patient reported vitals:  Assessment and Plan: Exposure to COVID-19 virus  - Plan: molnupiravir EUA (LAGEVRIO) 200 mg CAPS capsule  Viral illness  Memory loss  Given recent exposure to COVID and current symptoms COVID-19 virus suspected.  Patient advised to obtain COVID testing.  Continue treatment of symptoms with supportive care including rest, hydration, gargling with warm  salt water or Chloraseptic spray, OTC cough/cold medications.  Discussed antiviral medication r/b/a.  Patient would like to try medication.  Of note patient mentions contacting her son [name] in regards to recommendations given her memory loss.  Given strict precautions  Follow Up Instructions: F/u prn   [id number] [id number] [id number] 11-20 9443 21-30 I did not refer this patient for an OV in the next 24 hours for this/these issue(s).  I discussed the assessment and treatment plan with the patient. The patient was provided an opportunity to ask questions and all were answered. The patient agreed with the plan and demonstrated an understanding of the instructions.   The patient was advised to call back or seek an in-person evaluation if the symptoms worsen or if the condition fails to improve as anticipated.  I provided 14 minutes of non-face-to-face time during this encounter.   Billie Ruddy, MD

## 2021-05-30 NOTE — Progress Notes (Signed)
Spoke with son, Petrice Beedy per FYI and Dr Volanda Napoleon, informed him pt can take tylenol. Also let him know pt needs a COVID test and needs to test, antiviral was sent in to pharmacy  (4 pills BID x 5 days.), can get COVID test and Tylenol while there. If antiviral makes her feel worse, she can stop taking it. Advised him if she has any SOB, not staying hydrated or eating, increased/worsened sx go to ED. Patient's sone verbalized understanding.

## 2021-06-21 ENCOUNTER — Other Ambulatory Visit: Payer: Self-pay | Admitting: Adult Health

## 2021-06-21 DIAGNOSIS — I10 Essential (primary) hypertension: Secondary | ICD-10-CM

## 2021-06-23 NOTE — Telephone Encounter (Signed)
Pt needs a CPE. Pt has enough medication per son to last until CPE appt. Cory please check after me to make sure pt is due for CPE thanks

## 2021-06-24 NOTE — Telephone Encounter (Signed)
Noted  

## 2021-07-08 ENCOUNTER — Ambulatory Visit (INDEPENDENT_AMBULATORY_CARE_PROVIDER_SITE_OTHER): Payer: Medicare Other | Admitting: Adult Health

## 2021-07-08 ENCOUNTER — Encounter: Payer: Self-pay | Admitting: Adult Health

## 2021-07-08 VITALS — BP 160/70 | HR 54 | Temp 97.5°F | Ht 61.25 in | Wt 166.0 lb

## 2021-07-08 DIAGNOSIS — F02B0 Dementia in other diseases classified elsewhere, moderate, without behavioral disturbance, psychotic disturbance, mood disturbance, and anxiety: Secondary | ICD-10-CM | POA: Diagnosis not present

## 2021-07-08 DIAGNOSIS — F4323 Adjustment disorder with mixed anxiety and depressed mood: Secondary | ICD-10-CM | POA: Diagnosis not present

## 2021-07-08 DIAGNOSIS — E039 Hypothyroidism, unspecified: Secondary | ICD-10-CM

## 2021-07-08 DIAGNOSIS — I10 Essential (primary) hypertension: Secondary | ICD-10-CM | POA: Diagnosis not present

## 2021-07-08 DIAGNOSIS — N6313 Unspecified lump in the right breast, lower outer quadrant: Secondary | ICD-10-CM

## 2021-07-08 DIAGNOSIS — G3183 Dementia with Lewy bodies: Secondary | ICD-10-CM | POA: Diagnosis not present

## 2021-07-08 DIAGNOSIS — Z23 Encounter for immunization: Secondary | ICD-10-CM | POA: Diagnosis not present

## 2021-07-08 DIAGNOSIS — N289 Disorder of kidney and ureter, unspecified: Secondary | ICD-10-CM

## 2021-07-08 LAB — CBC WITH DIFFERENTIAL/PLATELET
Basophils Absolute: 0.1 10*3/uL (ref 0.0–0.1)
Basophils Relative: 1.9 % (ref 0.0–3.0)
Eosinophils Absolute: 0.1 10*3/uL (ref 0.0–0.7)
Eosinophils Relative: 2.3 % (ref 0.0–5.0)
HCT: 32.1 % — ABNORMAL LOW (ref 36.0–46.0)
Hemoglobin: 10.9 g/dL — ABNORMAL LOW (ref 12.0–15.0)
Lymphocytes Relative: 28.6 % (ref 12.0–46.0)
Lymphs Abs: 1.7 10*3/uL (ref 0.7–4.0)
MCHC: 33.9 g/dL (ref 30.0–36.0)
MCV: 91.1 fl (ref 78.0–100.0)
Monocytes Absolute: 0.6 10*3/uL (ref 0.1–1.0)
Monocytes Relative: 9.7 % (ref 3.0–12.0)
Neutro Abs: 3.4 10*3/uL (ref 1.4–7.7)
Neutrophils Relative %: 57.5 % (ref 43.0–77.0)
Platelets: 298 10*3/uL (ref 150.0–400.0)
RBC: 3.53 Mil/uL — ABNORMAL LOW (ref 3.87–5.11)
RDW: 13.5 % (ref 11.5–15.5)
WBC: 6 10*3/uL (ref 4.0–10.5)

## 2021-07-08 LAB — URINALYSIS, ROUTINE W REFLEX MICROSCOPIC
Bilirubin Urine: NEGATIVE
Hgb urine dipstick: NEGATIVE
Ketones, ur: NEGATIVE
Leukocytes,Ua: NEGATIVE
Nitrite: NEGATIVE
Specific Gravity, Urine: 1.015 (ref 1.000–1.030)
Total Protein, Urine: 30 — AB
Urine Glucose: NEGATIVE
Urobilinogen, UA: 0.2 (ref 0.0–1.0)
pH: 6 (ref 5.0–8.0)

## 2021-07-08 LAB — COMPREHENSIVE METABOLIC PANEL
ALT: 11 U/L (ref 0–35)
AST: 14 U/L (ref 0–37)
Albumin: 4.4 g/dL (ref 3.5–5.2)
Alkaline Phosphatase: 64 U/L (ref 39–117)
BUN: 16 mg/dL (ref 6–23)
CO2: 26 mEq/L (ref 19–32)
Calcium: 9.5 mg/dL (ref 8.4–10.5)
Chloride: 104 mEq/L (ref 96–112)
Creatinine, Ser: 1.26 mg/dL — ABNORMAL HIGH (ref 0.40–1.20)
GFR: 37.68 mL/min — ABNORMAL LOW (ref >60.00)
Glucose, Bld: 96 mg/dL (ref 70–99)
Potassium: 4.3 mEq/L (ref 3.5–5.1)
Sodium: 138 mEq/L (ref 135–145)
Total Bilirubin: 0.6 mg/dL (ref 0.2–1.2)
Total Protein: 7.3 g/dL (ref 6.0–8.3)

## 2021-07-08 LAB — TSH: TSH: 1 u[IU]/mL (ref 0.35–5.50)

## 2021-07-08 NOTE — Patient Instructions (Signed)
It was great seeing you today   We will follow up with you regarding your lab work   You received your flu shot today   Please let me know if you need anything   I will see you back in 6 months or sooner

## 2021-07-08 NOTE — Progress Notes (Signed)
Subjective:    Patient ID: [name], female    DOB: 09-01-1928, 87 y.o.   MRN: [id number]  HPI Patient presents for yearly preventative medicine examination. 87 year old female who  has a past medical history of Anemia, Chronic fatigue, Chronic renal insufficiency, stage III (moderate) (Post), Depression, HYPERTENSION (04/04/2007), HYPOTHYROIDISM (04/04/2007), Lewy body dementia ([place]), and Macular degeneration.  She is accompanied by her son [name] who helps provide additional information.  HTN - managed with Benicar 20 mg and Norvasc 10 mg. She denies dizziness, lightheadedness, blurred vision or headaches.  She did not take her medication prior to this appointment.  Does not check her blood pressure at home.  BP Readings from Last 5 Encounters:  07/08/21 (!) 160/70  04/11/21 140/75  04/09/21 (!) 205/66  02/25/21 (!) 197/62  12/18/20 (!) 150/74   Lewy Body Dementia -imaged by neurology.  Was started on Aricept in September 2021 which initially helped with delusions and hallucinations.  When she was last seen by neurology in August 2022 she reported continued delusions that her neighbor loved to come over and turn the faucet on her brother delusions.  Her MMSE at that visit was 17 out of 30.  At this time Seroquel was increased to 50 mg nightly and she was continued on benazepril 10 mg daily.  She continues to live at home and manage her household.  Is no longer driving which causes most of her depression.  Her son helps fix her pillbox and takes her out to eat.  She does forget to take her medications but infrequently.  She does report that she is sleeping well especially since the increase in her Seroquel.  Delusions are still present.  Hypothyroidism -takes Synthroid 150 mcg daily.  Depression-takes Zoloft 25 mg daily.  Right Breast Mass -was initially seen for this complaint in September 2022.  On exam she had a pea-sized hard movable mass noted at the 7 o'clock position of her right  breast.  At this time we discussed needing a mammogram to find out what the breast mass was.  She refused at this time and wanted to wait a few months to see what happened.  Today she reports that the breast mass is still present.  Does not seem to be increasing in size.  Does have pain intermittently at the location of the mass.  She has not noticed any redness or warmth.    All immunizations and health maintenance protocols were reviewed with the patient and needed orders were placed.  Appropriate screening laboratory values were ordered for the patient including screening of hyperlipidemia, renal function and hepatic function.  Medication reconciliation,  past medical history, social history, problem list and allergies were reviewed in detail with the patient  Goals were established with regard to weight loss, exercise, and  diet in compliance with medications Wt Readings from Last 3 Encounters:  07/08/21 166 lb (75.3 kg)  04/11/21 164 lb (74.4 kg)  04/09/21 165 lb 6.4 oz (75 kg)   End of life planning was discussed.  Review of Systems  Constitutional: Negative.   HENT: Negative.    Eyes: Negative.   Respiratory: Negative.    Cardiovascular: Negative.   Gastrointestinal: Negative.   Endocrine: Negative.   Genitourinary: Negative.   Musculoskeletal:  Positive for arthralgias.  Skin: Negative.   Allergic/Immunologic: Negative.   Neurological: Negative.   Hematological: Negative.   Psychiatric/Behavioral:  Positive for confusion and decreased concentration.    Past Medical History:  Diagnosis Date   Anemia    Chronic fatigue    Chronic renal insufficiency, stage III (moderate) (HCC)    Depression    HYPERTENSION 04/04/2007   HYPOTHYROIDISM 04/04/2007   Lewy body dementia ([place])    Macular degeneration     Social History   Socioeconomic History   Marital status: Widowed    Spouse name: Not on file   Number of children: 1   Years of education: 9th grade   Highest  education level: 9th grade  Occupational History   Occupation: Retired    Fish farm manager: RETIRED  Tobacco Use   Smoking status: Former    Packs/day: 1.00    Years: 7.00    Pack years: 7.00    Types: Cigarettes    Quit date: 10/13/1969    Years since quitting: 51.7   Smokeless tobacco: Never  Vaping Use   Vaping Use: Never used  Substance and Sexual Activity   Alcohol use: No   Drug use: No   Sexual activity: Not Currently  Other Topics Concern   Not on file  Social History Narrative   Widowed   1 son   Right handed    Lives alone    Social Determinants of Health   Financial Resource Strain: Low Risk    Difficulty of Paying Living Expenses: Not hard at all  Food Insecurity: No Food Insecurity   Worried About Charity fundraiser in the Last Year: Never true   [place] in the Last Year: Never true  Transportation Needs: No Transportation Needs   Lack of Transportation (Medical): No   Lack of Transportation (Non-Medical): No  Physical Activity: Inactive   Days of Exercise per Week: 0 days   Minutes of Exercise per Session: 0 min  Stress: No Stress Concern Present   Feeling of Stress : Not at all  Social Connections: Moderately Isolated   Frequency of Communication with Friends and Family: More than three times a week   Frequency of Social Gatherings with Friends and Family: More than three times a week   Attends Religious Services: More than 4 times per year   Active Member of [employer] or Organizations: No   Attends Archivist Meetings: Never   Marital Status: Widowed  Human resources officer Violence: Not At Risk   Fear of Current or Ex-Partner: No   Emotionally Abused: No   Physically Abused: No   Sexually Abused: No    Past Surgical History:  Procedure Laterality Date   ABDOMINAL HYSTERECTOMY     APPENDECTOMY     BACK SURGERY  2012   Cataracts     CHOLECYSTECTOMY     ERCP N/A 11/22/2014   Procedure: ENDOSCOPIC RETROGRADE CHOLANGIOPANCREATOGRAPHY (ERCP);   Surgeon: Clarene Essex, MD;  Location: [facility] ENDOSCOPY;  Service: Endoscopy;  Laterality: N/A;   HERNIA REPAIR     OOPHORECTOMY     ORIF TIBIA FRACTURE  2012   dr Sharol Given   SPINE SURGERY  2011   cervical    Family History  Problem Relation Age of Onset   Kidney cancer Mother 65       renal cell CA   Heart disease Father    Lymphoma Son    Breast cancer Daughter     Allergies  Allergen Reactions   Oxycodone Hcl     REACTION: itching    Current Outpatient Medications on File Prior to Visit  Medication Sig Dispense Refill   amLODipine (NORVASC) 10 MG tablet TAKE  1 TABLET BY MOUTH  DAILY 30 tablet 1   donepezil (ARICEPT) 10 MG tablet Take 1 tablet daily 90 tablet 3   levothyroxine (SYNTHROID) 150 MCG tablet TAKE 1 TABLET BY MOUTH  DAILY 30 tablet 1   Multiple Vitamin (MULTIVITAMIN) tablet Take 1 tablet by mouth daily.     Multiple Vitamins-Minerals (PRESERVISION AREDS 2 PO) Take by mouth.     olmesartan (BENICAR) 20 MG tablet TAKE 1 TABLET BY MOUTH  DAILY 30 tablet 1   omeprazole (PRILOSEC) 40 MG capsule TAKE 1 CAPSULE BY MOUTH  DAILY 30 capsule 1   QUEtiapine (SEROQUEL) 50 MG tablet Take 1 tablet (50 mg total) by mouth at bedtime. 90 tablet 3   sertraline (ZOLOFT) 25 MG tablet TAKE 1 TABLET BY MOUTH AT  BEDTIME 90 tablet 3   No current facility-administered medications on file prior to visit.    BP (!) 160/70   Pulse (!) 54   Temp (!) 97.5 F (36.4 C) (Oral)   Ht 5' 1.25" (1.556 m)   Wt 166 lb (75.3 kg)   SpO2 96%   BMI 31.11 kg/m        Objective:   Physical Exam Vitals and nursing note reviewed.  Constitutional:      General: She is not in acute distress.    Appearance: Normal appearance. She is well-developed. She is not ill-appearing.  HENT:     Head: Normocephalic and atraumatic.     Right Ear: Tympanic membrane, ear canal and external ear normal. There is no impacted cerumen.     Left Ear: Tympanic membrane, ear canal and external ear normal. There is no  impacted cerumen.     Nose: Nose normal. No congestion or rhinorrhea.     Mouth/Throat:     Mouth: Mucous membranes are moist.     Pharynx: Oropharynx is clear. No oropharyngeal exudate or posterior oropharyngeal erythema.  Eyes:     General:        Right eye: No discharge.        Left eye: No discharge.     Extraocular Movements: Extraocular movements intact.     Conjunctiva/sclera: Conjunctivae normal.     Pupils: Pupils are equal, round, and reactive to light.  Neck:     Thyroid: No thyromegaly.     Vascular: No carotid bruit.     Trachea: No tracheal deviation.  Cardiovascular:     Rate and Rhythm: Normal rate and regular rhythm.     Pulses: Normal pulses.     Heart sounds: Normal heart sounds. No murmur heard.   No friction rub. No gallop.  Pulmonary:     Effort: Pulmonary effort is normal. No respiratory distress.     Breath sounds: Normal breath sounds. No stridor. No wheezing, rhonchi or rales.  Chest:     Chest wall: No tenderness.  Abdominal:     General: Abdomen is flat. Bowel sounds are normal. There is no distension.     Palpations: Abdomen is soft. There is no mass.     Tenderness: There is no abdominal tenderness. There is no right CVA tenderness, left CVA tenderness, guarding or rebound.     Hernia: No hernia is present.  Musculoskeletal:        General: No swelling, tenderness, deformity or signs of injury. Normal range of motion.     Cervical back: Normal range of motion and neck supple.     Right lower leg: No edema.     Left lower  leg: No edema.  Lymphadenopathy:     Cervical: No cervical adenopathy.  Skin:    General: Skin is warm and dry.     Coloration: Skin is not jaundiced or pale.     Findings: No bruising, erythema, lesion or rash.  Neurological:     General: No focal deficit present.     Mental Status: She is alert and oriented to person, place, and time.     Cranial Nerves: No cranial nerve deficit.     Sensory: No sensory deficit.      Motor: No weakness.     Coordination: Coordination normal.     Gait: Gait normal.     Deep Tendon Reflexes: Reflexes normal.  Psychiatric:        Mood and Affect: Mood normal.        Speech: Speech normal.        Behavior: Behavior is agitated (at times with her son).        Thought Content: Thought content normal.        Cognition and Memory: Cognition and memory normal.        Judgment: Judgment normal.      Assessment & Plan:  1. Essential hypertension -Elevated today in the office as she did not take her medications prior to this appointment.  Advised to go home and take her medications. - CBC with Differential/Platelet; Future - Comprehensive metabolic panel; Future - TSH; Future - Urinalysis; Future - Urinalysis - TSH - Comprehensive metabolic panel - CBC with Differential/Platelet  2. Renal insufficiency -Continue to monitor - CBC with Differential/Platelet; Future - Comprehensive metabolic panel; Future - TSH; Future - Urinalysis; Future - Urinalysis - TSH - Comprehensive metabolic panel - CBC with Differential/Platelet  3. Hypothyroidism, unspecified type -Consider increasing Synthroid - CBC with Differential/Platelet; Future - Comprehensive metabolic panel; Future - TSH; Future - TSH - Comprehensive metabolic panel - CBC with Differential/Platelet  4. Adjustment disorder with mixed anxiety and depressed mood -Continue with Zoloft 25 mg, she does not want to go up on the medication right now - CBC with Differential/Platelet; Future - Comprehensive metabolic panel; Future - TSH; Future - TSH - Comprehensive metabolic panel - CBC with Differential/Platelet  5. Moderate Lewy body dementia without behavioral disturbance, psychotic disturbance, mood disturbance, or anxiety (HCC) -Continue Seroquel and Aricept.  Follow-up with neurology as directed - CBC with Differential/Platelet; Future - Comprehensive metabolic panel; Future - TSH; Future - TSH -  Comprehensive metabolic panel - CBC with Differential/Platelet  6. Need for immunization against influenza  - Flu Vaccine QUAD High Dose(Fluad)  7. Mass of lower outer quadrant of right breast -She continues to want to hold off on agnostic mammogram.  She understands her risks.  She reports that due to her age she would not undergo surgery, chemo, or radiation if the mass was cancerous.  Dorothyann Peng, NP

## 2021-07-25 ENCOUNTER — Other Ambulatory Visit: Payer: Self-pay | Admitting: Adult Health

## 2021-07-25 ENCOUNTER — Telehealth: Payer: Self-pay

## 2021-07-25 NOTE — Telephone Encounter (Signed)
Son of patient called asking if [name] will send in Rx for patients nerves or give [name] a call

## 2021-07-29 ENCOUNTER — Other Ambulatory Visit: Payer: Self-pay | Admitting: Adult Health

## 2021-07-29 MED ORDER — SERTRALINE HCL 25 MG PO TABS: 25.0000 mg | ORAL_TABLET | Freq: Every day | ORAL | 1 refills | Status: DC

## 2021-08-12 ENCOUNTER — Other Ambulatory Visit: Payer: Self-pay | Admitting: Adult Health

## 2021-08-12 DIAGNOSIS — I10 Essential (primary) hypertension: Secondary | ICD-10-CM

## 2021-08-29 ENCOUNTER — Telehealth: Payer: Self-pay | Admitting: Adult Health

## 2021-08-29 NOTE — Telephone Encounter (Signed)
Left detailed message on VM that Zoloft is an antidepressant; can call office with more questions.

## 2021-08-29 NOTE — Telephone Encounter (Signed)
Pt son [name] is calling to confirm sertraline is for his mother nerves

## 2021-09-19 ENCOUNTER — Telehealth: Payer: Self-pay | Admitting: Adult Health

## 2021-09-19 NOTE — Telephone Encounter (Addendum)
Pt son is calling and his mother is having the same symptoms she had in nov weak spells per son and would like to know if cory would call something into pharm  [id number] - [place], [place] - [address] AT [place] Phone:  [phone]  Fax:  [phone]    Pt does not want to make an appt unless cory recommends

## 2021-09-19 NOTE — Telephone Encounter (Signed)
Tried calling pt son on both numbers no answer. Pt will need an appt. If unable to wait until next week advise UC or ED.

## 2021-09-22 NOTE — Telephone Encounter (Signed)
FYI

## 2021-09-22 NOTE — Telephone Encounter (Signed)
Left message to return phone call.

## 2021-09-26 ENCOUNTER — Ambulatory Visit: Payer: Medicare Other

## 2021-09-26 ENCOUNTER — Telehealth: Payer: Self-pay

## 2021-09-26 NOTE — Telephone Encounter (Signed)
Unsuccessful attempt to reach patient on preferred number listed in notes for scheduled AWV. Left message on voicemail ok to reschedule. 

## 2021-10-03 ENCOUNTER — Telehealth: Payer: Self-pay | Admitting: Adult Health

## 2021-10-03 NOTE — Telephone Encounter (Signed)
Left message for patient to call back and schedule Medicare Annual Wellness Visit (AWV) either virtually or in office. Left  my [name] number [phone]   Last AWV 09/25/20  please schedule at anytime with [facility] Nurse Health Advisor 1 or 2   This should be a 45 minute visit.

## 2021-10-09 ENCOUNTER — Other Ambulatory Visit: Payer: Self-pay

## 2021-10-09 ENCOUNTER — Ambulatory Visit: Payer: Medicare Other | Admitting: Physician Assistant

## 2021-10-09 ENCOUNTER — Telehealth (INDEPENDENT_AMBULATORY_CARE_PROVIDER_SITE_OTHER): Payer: Medicare Other | Admitting: Adult Health

## 2021-10-09 ENCOUNTER — Telehealth: Payer: Self-pay | Admitting: Adult Health

## 2021-10-09 ENCOUNTER — Encounter: Payer: Self-pay | Admitting: Adult Health

## 2021-10-09 VITALS — Ht 61.25 in | Wt 166.0 lb

## 2021-10-09 DIAGNOSIS — M5442 Lumbago with sciatica, left side: Secondary | ICD-10-CM | POA: Diagnosis not present

## 2021-10-09 MED ORDER — METHYLPREDNISOLONE 4 MG PO TBPK: ORAL_TABLET | ORAL | 0 refills | Status: DC

## 2021-10-09 NOTE — Telephone Encounter (Signed)
Please advise 

## 2021-10-09 NOTE — Telephone Encounter (Signed)
Pt has been scheduled for a vv. ?

## 2021-10-09 NOTE — Telephone Encounter (Signed)
Patient son called in stating that patient woke up with pain in her hip. Patient son [name] is requesting a phone call back from [place].  ? ?Informed [name] that [name] is with a patient but he insisted on speaking with [facility]. ? ?[name] could be contacted at [phone]. ? ?Please advise. ?

## 2021-10-09 NOTE — Progress Notes (Signed)
Virtual Visit via Video Note ? ?I connected withNAME@ on 10/09/21 at  4:15 PM EST by a video enabled telemedicine application and verified that I am speaking with the correct person using two identifiers. ? Location patient: home ?Location provider:work or home office ?Persons participating in the virtual visit: patient, provider, son  ? ?I discussed the limitations of evaluation and management by telemedicine and the availability of in person appointments. The patient expressed understanding and agreed to proceed. ? ? ?HPI: ? ?88 year old female who is being evaluated today for left lower back and left hip pain.  This pain radiates down the outside of her left leg.  Pain is "excruciating".  Her pain started last night but has gotten worse as the day is gone on today.  Pain is present with sitting, standing, and walking.  She did take naproxen earlier today without much relief.  She denies chest pain, shortness of breath, or swelling of her left lower extremity ? ?ROS: See pertinent positives and negatives per HPI. ? ?Past Medical History:  ?Diagnosis Date  ? Anemia   ? Chronic fatigue   ? Chronic renal insufficiency, stage III (moderate) (HCC)   ? Depression   ? HYPERTENSION 04/04/2007  ? HYPOTHYROIDISM 04/04/2007  ? Lewy body dementia ([place])   ? Macular degeneration   ? ? ?Past Surgical History:  ?Procedure Laterality Date  ? ABDOMINAL HYSTERECTOMY    ? APPENDECTOMY    ? BACK SURGERY  2012  ? Cataracts    ? CHOLECYSTECTOMY    ? ERCP N/A 11/22/2014  ? Procedure: ENDOSCOPIC RETROGRADE CHOLANGIOPANCREATOGRAPHY (ERCP);  Surgeon: Clarene Essex, MD;  Location: [facility] ENDOSCOPY;  Service: Endoscopy;  Laterality: N/A;  ? HERNIA REPAIR    ? OOPHORECTOMY    ? ORIF TIBIA FRACTURE  2012  ? dr duda  ? [place] SURGERY  2011  ? cervical  ? ? ?Family History  ?Problem Relation Age of Onset  ? Kidney cancer Mother 63  ?     renal cell CA  ? Heart disease Father   ? Lymphoma Son   ? Breast cancer Daughter   ? ? ? ? ? ?Current Outpatient  Medications:  ?  amLODipine (NORVASC) 10 MG tablet, TAKE 1 TABLET BY MOUTH  DAILY, Disp: 60 tablet, Rfl: 5 ?  donepezil (ARICEPT) 10 MG tablet, Take 1 tablet daily, Disp: 90 tablet, Rfl: 3 ?  levothyroxine (SYNTHROID) 150 MCG tablet, TAKE 1 TABLET BY MOUTH  DAILY, Disp: 60 tablet, Rfl: 5 ?  methylPREDNISolone (MEDROL DOSEPAK) 4 MG TBPK tablet, Take as directed, Disp: 21 tablet, Rfl: 0 ?  Multiple Vitamin (MULTIVITAMIN) tablet, Take 1 tablet by mouth daily., Disp: , Rfl:  ?  Multiple Vitamins-Minerals (PRESERVISION AREDS 2 PO), Take by mouth., Disp: , Rfl:  ?  olmesartan (BENICAR) 20 MG tablet, TAKE 1 TABLET BY MOUTH  DAILY, Disp: 60 tablet, Rfl: 5 ?  omeprazole (PRILOSEC) 40 MG capsule, TAKE 1 CAPSULE BY MOUTH  DAILY, Disp: 60 capsule, Rfl: 5 ?  QUEtiapine (SEROQUEL) 50 MG tablet, Take 1 tablet (50 mg total) by mouth at bedtime., Disp: 90 tablet, Rfl: 3 ?  sertraline (ZOLOFT) 25 MG tablet, Take 1 tablet (25 mg total) by mouth at bedtime., Disp: 90 tablet, Rfl: 1 ? ?EXAM: ? ?VITALS per patient if applicable: ? ?GENERAL: alert, oriented, appears well and in no acute distress ? ?HEENT: atraumatic, conjunttiva clear, no obvious abnormalities on inspection of external nose and ears ? ?NECK: normal movements of the head and neck ? ?  LUNGS: on inspection no signs of respiratory distress, breathing rate appears normal, no obvious gross SOB, gasping or wheezing ? ?CV: no obvious cyanosis ? ?MS: moves all visible extremities without noticeable abnormality ? ?PSYCH/NEURO: pleasant and cooperative, no obvious depression or anxiety, speech and thought processing grossly intact ? ?ASSESSMENT AND PLAN: ? ?Discussed the following assessment and plan: ? ?1. Acute left-sided low back pain with left-sided sciatica ?- methylPREDNISolone (MEDROL DOSEPAK) 4 MG TBPK tablet; Take as directed  Dispense: 21 tablet; Refill: 0 ? ?I discussed the assessment and treatment plan with the patient. The patient was provided an opportunity to ask  questions and all were answered. The patient agreed with the plan and demonstrated an understanding of the instructions. ?  ?The patient was advised to call back or seek an in-person evaluation if the symptoms worsen or if the condition fails to improve as anticipated. ? ? ?Dorothyann Peng, NP  ? ?
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
## 2021-10-13 ENCOUNTER — Other Ambulatory Visit: Payer: Self-pay

## 2021-10-13 ENCOUNTER — Telehealth (INDEPENDENT_AMBULATORY_CARE_PROVIDER_SITE_OTHER): Payer: Medicare Other | Admitting: Physician Assistant

## 2021-10-13 DIAGNOSIS — G3183 Dementia with Lewy bodies: Secondary | ICD-10-CM | POA: Diagnosis not present

## 2021-10-13 DIAGNOSIS — F02818 Dementia in other diseases classified elsewhere, unspecified severity, with other behavioral disturbance: Secondary | ICD-10-CM | POA: Diagnosis not present

## 2021-10-13 DIAGNOSIS — M25562 Pain in left knee: Secondary | ICD-10-CM | POA: Diagnosis not present

## 2021-10-13 DIAGNOSIS — M545 Low back pain, unspecified: Secondary | ICD-10-CM | POA: Diagnosis not present

## 2021-10-13 MED ORDER — QUETIAPINE FUMARATE 50 MG PO TABS: 50.0000 mg | ORAL_TABLET | Freq: Every day | ORAL | 0 refills | Status: DC

## 2021-10-13 MED ORDER — DONEPEZIL HCL 10 MG PO TABS: ORAL_TABLET | ORAL | 3 refills | Status: DC

## 2021-10-17 ENCOUNTER — Telehealth: Payer: Self-pay | Admitting: Physician Assistant

## 2021-10-17 NOTE — Telephone Encounter (Signed)
Spoke with pt son informed him [name] medication doses are  donepezil 10 mg daily, and Seroquel to 75 mg nightly. At this time they do not need refills I was just double check his self  ?

## 2021-10-17 NOTE — Telephone Encounter (Signed)
Patients son called about Stepanie's medication. He wants to make sure he has the right dosage of medication, seroquel and donezepil. ?

## 2021-10-27 DIAGNOSIS — M545 Low back pain, unspecified: Secondary | ICD-10-CM | POA: Diagnosis not present

## 2021-10-30 DIAGNOSIS — M545 Low back pain, unspecified: Secondary | ICD-10-CM | POA: Diagnosis not present

## 2021-10-30 DIAGNOSIS — M533 Sacrococcygeal disorders, not elsewhere classified: Secondary | ICD-10-CM | POA: Diagnosis not present

## 2021-10-30 DIAGNOSIS — M5388 Other specified dorsopathies, sacral and sacrococcygeal region: Secondary | ICD-10-CM | POA: Diagnosis not present

## 2021-11-03 ENCOUNTER — Telehealth: Payer: Self-pay | Admitting: Oncology

## 2021-11-03 DIAGNOSIS — M533 Sacrococcygeal disorders, not elsewhere classified: Secondary | ICD-10-CM | POA: Diagnosis not present

## 2021-11-03 NOTE — Telephone Encounter (Signed)
[facility] Scheduling Phone Note ? ?I contacted Liandra B Pae regarding a referral from Dr. Rushie Nyhan for purpose of evaluation and work up of sacral mass seen on MRI.  Tasnim B Bigler was aware of her referral and reason.  Information on reason for referral was not necessary. ? ?Patient was informed about the [facility] and the intent being to complete a diagnostic/prognostic work-up for her suspicious findings.  She is aware her appointment is with our Physician Assistant to identify a diagnostic plan of care and to arrange further oncologist or specialist care as indicated. ? ?I confirmed with Shanai B Pica she has transportation to her [facility] appointment.  Patient is aware to bring a list of medications, as well as insurance cards.  Visitor policy and COVID protocol reviewed with patient as well, including what to expect on arrival to the [facility] regarding check-in process, visit, and potential for labs afterwards. ? ?We look forward to meeting [place] and completing her diagnostic work-up and arranging her subsequent appointment with our oncologist team. ? ?Diagnostic Clinic Specific Info: ? ?Best contact/way to contact patient:  mobile ?CHL updated to reflect?:  not applicable ?Is there a HC POA?:  ? ?Location of Diagnostic Clinic Appointment and Contact Info Provided to Patient: ? ?[employer] [address] ?[address] ?[place], [place] [address] ?[phone] ? ? ?Reason for Referral, Clinical Information: ? ?Eval by orthopedics regarding leg pain, 10/27/2021 MRI Lumbar Spine at [facility] concerning for S2, S3 mass.  MRI Sacrum and SI Joints on 10/30/2021 suggestive of benign process.  Referred to oncology for further.  The following labs were obtained on 11/03/2021 @ [facility] in [place]: ? ?-  Immunofixation, Serum ?-  CBC w/ Diff ?-  Sed Rate ?-  C-Reactive Protein ?-  Protein, Total ?-  Protein Electrophoresis ?-   Uric Acid ?-  ANA Screen, IFA with reflex titer and pattern ?-  Immunofixation, urine   ?-  24hr urine (goes back to lab Wed, 11/05/2021) ?

## 2021-11-05 DIAGNOSIS — M533 Sacrococcygeal disorders, not elsewhere classified: Secondary | ICD-10-CM | POA: Diagnosis not present

## 2021-11-10 NOTE — Progress Notes (Signed)
?[facility] ?[place] ?[phone]   [phone] ? ?INITIAL CONSULTATION: ? ?Patient Care Team: ?Dorothyann Peng, NP as PCP - General (Family Medicine) ?Cameron Sprang, MD as Consulting Physician (Neurology) ? ?CHIEF COMPLAINTS/PURPOSE OF CONSULTATION:  ?Sacral mass ? ?HISTORY OF PRESENTING ILLNESS:  ?[name] 88 y.o. female with medical history significant for anemia, CKD stage III, hypertension, hypothyroidism, depression, macular degeneration and lewy body dementia. Patient is accompanied by her son and friend for this visit. ? ?On review of the previous records, [name] presented to her PCP, Dorothyann Peng, for acute left sided low back pain with left sided sciatica on 10/09/2021. Patient was given medrol dose pak without improvement of pain. She was then evaluate by Dr. Latanya Maudlin at [facility] on 10/13/2021 and received steroid injections without improvement of pain. She underwent imaging with MRI lumbar spine on 10/27/2021 and MRI sacrum on 10/30/2021. Findings included a 5.5 x 2.0 x 6.7 cm heterogenious mass with the sacrum with extension through the posterior vertebral body cortex. There is intrinsice fat signal with the central portion of the mass which is reassuring for a benign process. Labs were done on 11/03/2021 that showed no evidence of monoclonal protein but did show anemia with Hgb 10.8, MCV 92.5, WBC 8.1, Plt 250, creatinine 1.47. ? ?On exam today, [name] reports developing left low back pain that radiates down to the left foot approximately one month ago. The pain comes and goes but is severe when it occurs. She is only able to sleep on her right side. She denies any peripheral neuropathy at this time. She has some cramping in her hands and feet. She reports progressive fatigue and sedentary for long periods of time. Her appetite has declined and reports losing 5-6 lbs in the last several weeks. She denies nausea, vomiting or abdominal pain.  Her bowel habits are unchanged without recurrent episodes of diarrhea or constipation. Patient's son reports occasional episodes of bowel incontinence. She denies easy bruising or signs of active bleeding. She denies fevers, chills, night sweats, shortness of breath, chest pain or cough. She has no other complaints. Rest of the 10 point ROS is below.  ? ?MEDICAL HISTORY:  ?Past Medical History:  ?Diagnosis Date  ? Anemia   ? Chronic fatigue   ? Chronic renal insufficiency, stage III (moderate) (HCC)   ? Depression   ? HYPERTENSION 04/04/2007  ? HYPOTHYROIDISM 04/04/2007  ? Lewy body dementia ([place])   ? Macular degeneration   ? ? ?SURGICAL HISTORY: ?Past Surgical History:  ?Procedure Laterality Date  ? ABDOMINAL HYSTERECTOMY    ? APPENDECTOMY    ? BACK SURGERY  2012  ? Cataracts    ? CHOLECYSTECTOMY    ? ERCP N/A 11/22/2014  ? Procedure: ENDOSCOPIC RETROGRADE CHOLANGIOPANCREATOGRAPHY (ERCP);  Surgeon: Clarene Essex, MD;  Location: [facility] ENDOSCOPY;  Service: Endoscopy;  Laterality: N/A;  ? HERNIA REPAIR    ? OOPHORECTOMY    ? ORIF TIBIA FRACTURE  2012  ? dr duda  ? [place] SURGERY  2011  ? cervical  ? ? ?SOCIAL HISTORY: ?Social History  ? ?Socioeconomic History  ? Marital status: Widowed  ?  Spouse name: Not on file  ? Number of children: 1  ? Years of education: 9th grade  ? Highest education level: 9th grade  ?Occupational History  ? Occupation: Retired  ?  Employer: RETIRED  ?Tobacco Use  ? Smoking status: Former  ?  Packs/day: 1.00  ?  Years: 7.00  ?  Pack years: 7.00  ?  Types: Cigarettes  ?  Quit date: 10/13/1969  ?  Years since quitting: 52.1  ? Smokeless tobacco: Never  ?Vaping Use  ? Vaping Use: Never used  ?Substance and Sexual Activity  ? Alcohol use: No  ? Drug use: No  ? Sexual activity: Not Currently  ?Other Topics Concern  ? Not on file  ?Social History Narrative  ? Widowed  ? 1 son  ? Right handed   ? Lives alone   ? ?Social Determinants of Health  ? ?Financial Resource Strain: Not on file  ?Food Insecurity: Not on  file  ?Transportation Needs: Not on file  ?Physical Activity: Not on file  ?Stress: Not on file  ?Social Connections: Not on file  ?Intimate Partner Violence: Not on file  ? ? ?FAMILY HISTORY: ?Family History  ?Problem Relation Age of Onset  ? Kidney cancer Mother 21  ?     renal cell CA  ? Heart disease Father   ? Lymphoma Son   ? Breast cancer Daughter   ? ? ?ALLERGIES:  is allergic to oxycodone hcl. ? ?MEDICATIONS:  ?Current Outpatient Medications  ?Medication Sig Dispense Refill  ? amLODipine (NORVASC) 10 MG tablet TAKE 1 TABLET BY MOUTH  DAILY 60 tablet 5  ? donepezil (ARICEPT) 10 MG tablet Take 1 tablet daily 90 tablet 3  ? gabapentin (NEURONTIN) 300 MG capsule Take 1 capsule (300 mg total) by mouth at bedtime. 30 capsule 0  ? levothyroxine (SYNTHROID) 150 MCG tablet TAKE 1 TABLET BY MOUTH  DAILY 60 tablet 5  ? Multiple Vitamin (MULTIVITAMIN) tablet Take 1 tablet by mouth daily.    ? Multiple Vitamins-Minerals (PRESERVISION AREDS 2 PO) Take by mouth.    ? olmesartan (BENICAR) 20 MG tablet TAKE 1 TABLET BY MOUTH  DAILY 60 tablet 5  ? omeprazole (PRILOSEC) 40 MG capsule TAKE 1 CAPSULE BY MOUTH  DAILY 60 capsule 5  ? QUEtiapine (SEROQUEL) 50 MG tablet Take 1 tablet (50 mg total) by mouth at bedtime. 165 tablet 0  ? sertraline (ZOLOFT) 25 MG tablet Take 1 tablet (25 mg total) by mouth at bedtime. 90 tablet 1  ? methylPREDNISolone (MEDROL DOSEPAK) 4 MG TBPK tablet Take as directed (Patient not taking: Reported on 11/11/2021) 21 tablet 0  ? ?No current facility-administered medications for this visit.  ? ? ?REVIEW OF SYSTEMS:   ?Constitutional: ( - ) fevers, ( - )  chills , ( - ) night sweats ?Eyes: ( - ) blurriness of vision, ( - ) double vision, ( - ) watery eyes ?Ears, nose, mouth, throat, and face: ( - ) mucositis, ( - ) sore throat ?Respiratory: ( - ) cough, ( - ) dyspnea, ( - ) wheezes ?Cardiovascular: ( - ) palpitation, ( - ) chest discomfort, ( - ) lower extremity swelling ?Gastrointestinal:  ( - ) nausea, ( -  ) heartburn, ( - ) change in bowel habits ?Skin: ( - ) abnormal skin rashes ?Lymphatics: ( - ) new lymphadenopathy, ( - ) easy bruising ?Neurological: ( - ) numbness, ( - ) tingling, ( - ) new weaknesses ?Behavioral/Psych: ( - ) mood change, ( - ) new changes  ?All other systems were reviewed with the patient and are negative. ? ?PHYSICAL EXAMINATION: ?ECOG PERFORMANCE STATUS: 2 - Symptomatic, <50% confined to bed ? ?Vitals:  ? 11/11/21 1512  ?BP: (!) 195/53  ?Pulse: (!) 59  ?Resp: 16  ?Temp: 98.1 ?F (36.7 ?C)  ?SpO2: 98%  ? ?  Filed Weights  ? 11/11/21 1512  ?Weight: 161 lb (73 kg)  ?Repeat BP was 186/51.  ? ?GENERAL: well appearing female in NAD  ?SKIN: skin color, texture, turgor are normal, no rashes or significant lesions ?EYES: conjunctiva are pink and non-injected, sclera clear ?OROPHARYNX: no exudate, no erythema; lips, buccal mucosa, and tongue normal  ?NECK: supple, non-tender ?LYMPH:  no palpable lymphadenopathy in the cervical or supraclavicular lymph nodes.  ?LUNGS: clear to auscultation and percussion with normal breathing effort ?HEART: regular rate & rhythm and no murmurs and no lower extremity edema ?ABDOMEN: soft, non-tender, non-distended, normal bowel sounds ?Musculoskeletal: no cyanosis of digits and no clubbing  ?PSYCH: alert & oriented x 3, fluent speech ?NEURO: no focal motor/sensory deficits ? ?LABORATORY DATA:  ?I have reviewed the data as listed ? ?  Latest Ref Rng & Units 11/11/2021  ?  4:35 PM 07/08/2021  ? 11:45 AM 02/25/2021  ?  5:56 PM  ?CBC  ?WBC 4.0 - 10.5 K/uL 7.8   6.0   7.5    ?Hemoglobin 12.0 - 15.0 g/dL 10.4   10.9   11.0    ?Hematocrit 36.0 - 46.0 % 30.7   32.1   32.6    ?Platelets 150 - 400 K/uL 281   298.0   306    ? ? ? ?  Latest Ref Rng & Units 11/11/2021  ?  4:35 PM 07/08/2021  ? 11:45 AM 02/25/2021  ?  5:56 PM  ?CMP  ?Glucose 70 - 99 mg/dL 102   96   89    ?BUN 8 - 23 mg/dL '24   16   23    '$ ?Creatinine 0.44 - 1.00 mg/dL 1.38   1.26   1.36    ?Sodium 135 - 145 mmol/L 132   138    133    ?Potassium 3.5 - 5.1 mmol/L 4.7   4.3   4.7    ?Chloride 98 - 111 mmol/L 102   104   100    ?CO2 22 - 32 mmol/L '21   26   24    '$ ?Calcium 8.9 - 10.3 mg/dL 9.1   9.5   9.0    ?Total Protein 6.5

## 2021-11-11 ENCOUNTER — Other Ambulatory Visit: Payer: Self-pay

## 2021-11-11 ENCOUNTER — Inpatient Hospital Stay: Payer: Medicare Other | Attending: Physician Assistant | Admitting: Physician Assistant

## 2021-11-11 VITALS — BP 195/53 | HR 59 | Temp 98.1°F | Resp 16 | Wt 161.0 lb

## 2021-11-11 DIAGNOSIS — G3183 Dementia with Lewy bodies: Secondary | ICD-10-CM | POA: Diagnosis not present

## 2021-11-11 DIAGNOSIS — Z87891 Personal history of nicotine dependence: Secondary | ICD-10-CM | POA: Diagnosis not present

## 2021-11-11 DIAGNOSIS — E039 Hypothyroidism, unspecified: Secondary | ICD-10-CM | POA: Diagnosis not present

## 2021-11-11 DIAGNOSIS — D649 Anemia, unspecified: Secondary | ICD-10-CM | POA: Diagnosis not present

## 2021-11-11 DIAGNOSIS — N183 Chronic kidney disease, stage 3 unspecified: Secondary | ICD-10-CM | POA: Diagnosis not present

## 2021-11-11 DIAGNOSIS — M79605 Pain in left leg: Secondary | ICD-10-CM | POA: Insufficient documentation

## 2021-11-11 DIAGNOSIS — M533 Sacrococcygeal disorders, not elsewhere classified: Secondary | ICD-10-CM | POA: Insufficient documentation

## 2021-11-11 DIAGNOSIS — I129 Hypertensive chronic kidney disease with stage 1 through stage 4 chronic kidney disease, or unspecified chronic kidney disease: Secondary | ICD-10-CM | POA: Diagnosis not present

## 2021-11-11 DIAGNOSIS — Z79899 Other long term (current) drug therapy: Secondary | ICD-10-CM | POA: Diagnosis not present

## 2021-11-11 DIAGNOSIS — D638 Anemia in other chronic diseases classified elsewhere: Secondary | ICD-10-CM

## 2021-11-11 DIAGNOSIS — M792 Neuralgia and neuritis, unspecified: Secondary | ICD-10-CM

## 2021-11-11 LAB — CBC WITH DIFFERENTIAL (CANCER CENTER ONLY)
Abs Immature Granulocytes: 0.03 10*3/uL (ref 0.00–0.07)
Basophils Absolute: 0 10*3/uL (ref 0.0–0.1)
Basophils Relative: 1 %
Eosinophils Absolute: 0.1 10*3/uL (ref 0.0–0.5)
Eosinophils Relative: 1 %
HCT: 30.7 % — ABNORMAL LOW (ref 36.0–46.0)
Hemoglobin: 10.4 g/dL — ABNORMAL LOW (ref 12.0–15.0)
Immature Granulocytes: 0 %
Lymphocytes Relative: 19 %
Lymphs Abs: 1.5 10*3/uL (ref 0.7–4.0)
MCH: 31 pg (ref 26.0–34.0)
MCHC: 33.9 g/dL (ref 30.0–36.0)
MCV: 91.4 fL (ref 80.0–100.0)
Monocytes Absolute: 0.8 10*3/uL (ref 0.1–1.0)
Monocytes Relative: 11 %
Neutro Abs: 5.4 10*3/uL (ref 1.7–7.7)
Neutrophils Relative %: 68 %
Platelet Count: 281 10*3/uL (ref 150–400)
RBC: 3.36 MIL/uL — ABNORMAL LOW (ref 3.87–5.11)
RDW: 13.3 % (ref 11.5–15.5)
WBC Count: 7.8 10*3/uL (ref 4.0–10.5)
nRBC: 0 % (ref 0.0–0.2)

## 2021-11-11 LAB — CMP (CANCER CENTER ONLY)
ALT: 16 U/L (ref 0–44)
AST: 12 U/L — ABNORMAL LOW (ref 15–41)
Albumin: 4.2 g/dL (ref 3.5–5.0)
Alkaline Phosphatase: 66 U/L (ref 38–126)
Anion gap: 9 (ref 5–15)
BUN: 24 mg/dL — ABNORMAL HIGH (ref 8–23)
CO2: 21 mmol/L — ABNORMAL LOW (ref 22–32)
Calcium: 9.1 mg/dL (ref 8.9–10.3)
Chloride: 102 mmol/L (ref 98–111)
Creatinine: 1.38 mg/dL — ABNORMAL HIGH (ref 0.44–1.00)
GFR, Estimated: 36 mL/min — ABNORMAL LOW (ref >60)
Glucose, Bld: 102 mg/dL — ABNORMAL HIGH (ref 70–99)
Potassium: 4.7 mmol/L (ref 3.5–5.1)
Sodium: 132 mmol/L — ABNORMAL LOW (ref 135–145)
Total Bilirubin: 0.4 mg/dL (ref 0.3–1.2)
Total Protein: 6.9 g/dL (ref 6.5–8.1)

## 2021-11-11 LAB — VITAMIN B12: Vitamin B-12: 355 pg/mL (ref 180–914)

## 2021-11-11 LAB — FOLATE: Folate: 42.5 ng/mL (ref >5.9)

## 2021-11-11 LAB — IRON AND IRON BINDING CAPACITY (CC-WL,HP ONLY)
Iron: 54 ug/dL (ref 28–170)
Saturation Ratios: 17 % (ref 10.4–31.8)
TIBC: 321 ug/dL (ref 250–450)
UIBC: 267 ug/dL (ref 148–442)

## 2021-11-11 LAB — LACTATE DEHYDROGENASE: LDH: 163 U/L (ref 98–192)

## 2021-11-11 MED ORDER — GABAPENTIN 300 MG PO CAPS: 300.0000 mg | ORAL_CAPSULE | Freq: Every day | ORAL | 0 refills | Status: DC

## 2021-11-12 ENCOUNTER — Telehealth: Payer: Self-pay | Admitting: Adult Health

## 2021-11-12 LAB — FERRITIN: Ferritin: 177 ng/mL (ref 11–307)

## 2021-11-12 NOTE — Telephone Encounter (Signed)
Left message for patient to call back and schedule Medicare Annual Wellness Visit (AWV) either virtually or in office. Left  my jabber number [phone]   Last AWV ;09/25/20  please schedule at anytime with [facility] Nurse Health Advisor 1 or 2    

## 2021-11-13 DIAGNOSIS — M533 Sacrococcygeal disorders, not elsewhere classified: Secondary | ICD-10-CM | POA: Insufficient documentation

## 2021-11-17 ENCOUNTER — Ambulatory Visit
Admission: RE | Admit: 2021-11-17 | Discharge: 2021-11-17 | Disposition: A | Discharge status: Home / Self Care | Payer: Self-pay | Source: Ambulatory Visit | Attending: Physician Assistant | Admitting: Physician Assistant

## 2021-11-17 ENCOUNTER — Other Ambulatory Visit (HOSPITAL_COMMUNITY): Payer: Self-pay | Admitting: Physician Assistant

## 2021-11-17 DIAGNOSIS — C801 Malignant (primary) neoplasm, unspecified: Secondary | ICD-10-CM

## 2021-11-18 ENCOUNTER — Telehealth: Payer: Self-pay

## 2021-11-18 MED ORDER — PREGABALIN 25 MG PO CAPS: ORAL_CAPSULE | ORAL | 0 refills | Status: DC

## 2021-11-18 NOTE — Addendum Note (Signed)
Addended by: Dede Query T on: 11/18/2021 03:10 PM ? ? Modules accepted: Orders ? ?

## 2021-11-18 NOTE — Telephone Encounter (Signed)
We can switch from gabapentin to Lyrica. Sent new prescription to pharmacy on file. Recommend to start with one tablet in the evening and if tolerated, increase to twice a day. ?

## 2021-11-18 NOTE — Telephone Encounter (Signed)
Pt's son, called to say that patient took  prescribed Neurontin for 3 days and woke feeling drowsy and confused. They feel that the Seroquel is interacting with the Neurontin to cause this change. Patient stopped taking Neurontin but is now hurting. Son wanted to let Dede Query know and to see what can be done for pain.  ? ?Routed to provider. ?

## 2021-11-18 NOTE — Telephone Encounter (Signed)
Relayed to patient's son that Dede Query PA will send prescription to [facility] on [address] for Lyrica to address Neuropathic pain discussed in earlier phone conversation today. ?

## 2021-11-19 ENCOUNTER — Telehealth: Payer: Self-pay

## 2021-11-19 ENCOUNTER — Other Ambulatory Visit: Payer: Self-pay | Admitting: Physician Assistant

## 2021-11-19 ENCOUNTER — Telehealth: Payer: Self-pay | Admitting: Physician Assistant

## 2021-11-19 DIAGNOSIS — M533 Sacrococcygeal disorders, not elsewhere classified: Secondary | ICD-10-CM

## 2021-11-19 DIAGNOSIS — M792 Neuralgia and neuritis, unspecified: Secondary | ICD-10-CM

## 2021-11-19 NOTE — Telephone Encounter (Signed)
I called patient's son, Mr. Vannesa Abair, to discuss the MRI comparisons from 10/27/2021 to prior scans.  The sacral lesion appears mildly progressive compared to a 05/09/2008 number MRI but is stable to at most minimally larger compared to September 05, 2014 lumbar MRI.  Given the chronic nature of the lesion, this is most consistent with an indolent process such as an atypical hemangioma.  Given these findings, we do not recommend a biopsy.  Patient started Lyrica last night for the neuropathic pain and denies any pain today.  We we will make a referral to [facility] to further evaluate the sacral mass and determine if there are any interventions to minimize pain. We will keep patient pending in our clinic. . Mr. Hunn expressed understanding of the plan provided.  ?

## 2021-11-19 NOTE — Telephone Encounter (Signed)
can you send a referral to [facility].  IT ? ?Referral faxed & confirmation received.  HB ? ?Office notes and telephone note faxed to pt's PCP  [name] Peng, NP  confirmation received.  HB ?

## 2021-11-25 ENCOUNTER — Telehealth: Payer: Medicare Other | Admitting: Adult Health

## 2021-12-02 ENCOUNTER — Ambulatory Visit (INDEPENDENT_AMBULATORY_CARE_PROVIDER_SITE_OTHER): Payer: Medicare Other | Admitting: Adult Health

## 2021-12-02 ENCOUNTER — Encounter: Payer: Self-pay | Admitting: Adult Health

## 2021-12-02 VITALS — BP 160/58 | HR 62 | Temp 99.1°F | Ht 61.25 in | Wt 160.0 lb

## 2021-12-02 DIAGNOSIS — R5383 Other fatigue: Secondary | ICD-10-CM

## 2021-12-02 NOTE — Progress Notes (Signed)
? ?Subjective:  ? ? Patient ID: [name], female    DOB: [birth year], 88 y.o.   MRN: [id number] ? ?HPI ?88 year old female who  has a past medical history of Anemia, Chronic fatigue, Chronic renal insufficiency, stage III (moderate) ([place]), Depression, HYPERTENSION (04/04/2007), HYPOTHYROIDISM (04/04/2007), Lewy body dementia ([place]), and Macular degeneration. ? ?She presents to the office today with her son for chronic fatigue.  This has been an ongoing issue for roughly 1 year.  Patient states to have no energy and I do not want to do anything".  She denies other symptoms such as fevers, chills, chest pain, shortness of breath, nausea, vomiting, or diarrhea. ? ?She recently had a fall at home she does not remember due to Lewy body dementia.  She has a well-healing bruise on her forehead and right arm.  Denies any pain.  This fall happened roughly 1 week ago. ? ?He did have blood work done a few weeks ago that showed anemia with a hemoglobin of 10.4.  Her iron level was on the low side of normal.  B12 and folate were within normal limits. ? ?She is on medication such as Seroquel, Aricept, and Lyrica which can cause her to feel fatigued.  This was happening before she started Lyrica though ? ? ?Review of Systems ?See HPI  ? ?Past Medical History:  ?Diagnosis Date  ? Anemia   ? Chronic fatigue   ? Chronic renal insufficiency, stage III (moderate) (HCC)   ? Depression   ? HYPERTENSION 04/04/2007  ? HYPOTHYROIDISM 04/04/2007  ? Lewy body dementia ([place])   ? Macular degeneration   ? ? ?Social History  ? ?Socioeconomic History  ? Marital status: Widowed  ?  Spouse name: Not on file  ? Number of children: 1  ? Years of education: 9th grade  ? Highest education level: 9th grade  ?Occupational History  ? Occupation: Retired  ?  Employer: RETIRED  ?Tobacco Use  ? Smoking status: Former  ?  Packs/day: 1.00  ?  Years: 7.00  ?  Pack years: 7.00  ?  Types: Cigarettes  ?  Quit date: 10/13/1969  ?  Years since quitting: 52.1  ? Smokeless  tobacco: Never  ?Vaping Use  ? Vaping Use: Never used  ?Substance and Sexual Activity  ? Alcohol use: No  ? Drug use: No  ? Sexual activity: Not Currently  ?Other Topics Concern  ? Not on file  ?Social History Narrative  ? Widowed  ? 1 son  ? Right handed   ? Lives alone   ? ?Social Determinants of Health  ? ?Financial Resource Strain: Not on file  ?Food Insecurity: Not on file  ?Transportation Needs: Not on file  ?Physical Activity: Not on file  ?Stress: Not on file  ?Social Connections: Not on file  ?Intimate Partner Violence: Not on file  ? ? ?Past Surgical History:  ?Procedure Laterality Date  ? ABDOMINAL HYSTERECTOMY    ? APPENDECTOMY    ? BACK SURGERY  2012  ? Cataracts    ? CHOLECYSTECTOMY    ? ERCP N/A 11/22/2014  ? Procedure: ENDOSCOPIC RETROGRADE CHOLANGIOPANCREATOGRAPHY (ERCP);  Surgeon: Clarene Essex, MD;  Location: [facility] ENDOSCOPY;  Service: Endoscopy;  Laterality: N/A;  ? HERNIA REPAIR    ? OOPHORECTOMY    ? ORIF TIBIA FRACTURE  2012  ? dr duda  ? [place] SURGERY  2011  ? cervical  ? ? ?Family History  ?Problem Relation Age of Onset  ? Kidney  cancer Mother 40  ?     renal cell CA  ? Heart disease Father   ? Lymphoma Son   ? Breast cancer Daughter   ? ? ?Allergies  ?Allergen Reactions  ? Oxycodone Hcl   ?  REACTION: itching  ? ? ?Current Outpatient Medications on File Prior to Visit  ?Medication Sig Dispense Refill  ? amLODipine (NORVASC) 10 MG tablet TAKE 1 TABLET BY MOUTH  DAILY 60 tablet 5  ? donepezil (ARICEPT) 10 MG tablet Take 1 tablet daily 90 tablet 3  ? levothyroxine (SYNTHROID) 150 MCG tablet TAKE 1 TABLET BY MOUTH  DAILY 60 tablet 5  ? Multiple Vitamin (MULTIVITAMIN) tablet Take 1 tablet by mouth daily.    ? Multiple Vitamins-Minerals (PRESERVISION AREDS 2 PO) Take by mouth.    ? olmesartan (BENICAR) 20 MG tablet TAKE 1 TABLET BY MOUTH  DAILY 60 tablet 5  ? omeprazole (PRILOSEC) 40 MG capsule TAKE 1 CAPSULE BY MOUTH  DAILY 60 capsule 5  ? pregabalin (LYRICA) 25 MG capsule Start with 1 tablet once a  day in the evening. If dose is tolerated, then increase to twice a day. 60 capsule 0  ? QUEtiapine (SEROQUEL) 50 MG tablet Take 1 tablet (50 mg total) by mouth at bedtime. 165 tablet 0  ? sertraline (ZOLOFT) 25 MG tablet Take 1 tablet (25 mg total) by mouth at bedtime. 90 tablet 1  ? ?No current facility-administered medications on file prior to visit.  ? ? ?BP (!) 160/58   Pulse 62   Temp 99.1 ?F (37.3 ?C) (Oral)   Ht 5' 1.25" (1.556 m)   Wt 160 lb (72.6 kg)   SpO2 95%   BMI 29.99 kg/m?  ? ? ?   ?Objective:  ? Physical Exam ?Vitals and nursing note reviewed.  ?Constitutional:   ?   Appearance: Normal appearance.  ?Cardiovascular:  ?   Rate and Rhythm: Normal rate and regular rhythm.  ?   Pulses: Normal pulses.  ?   Heart sounds: Normal heart sounds.  ?Pulmonary:  ?   Effort: Pulmonary effort is normal.  ?   Breath sounds: Normal breath sounds.  ?Musculoskeletal:     ?   General: Normal range of motion.  ?Skin: ?   Findings: Bruising present.  ?   Comments: Well-healing bruise noted on forehead and on right forearm  ?Neurological:  ?   General: No focal deficit present.  ?   Mental Status: She is alert and oriented to person, place, and time.  ?Psychiatric:     ?   Mood and Affect: Mood normal.     ?   Behavior: Behavior normal.     ?   Cognition and Memory: She exhibits impaired recent memory and impaired remote memory.  ? ? ?   ?Assessment & Plan:  ?1. Other fatigue ?-Discussed possible etiologies with the patient and her son.  He is could be related to medication, but the son does not want to change doses of any of her medications because she is well controlled on them.  Could also be due to age and/or worsening dementia.  May also be due to mild anemia.  They would rather try using an iron supplement every other day to see if she gets some more energy.  She does live a pretty sedentary life and was encouraged to get out of the house throughout the week.  Her son will keep me updated on progress after  starting iron supplement ? ?  Dorothyann Peng, NP ? ?Time of total for care on the day of the encounter 32 minutes. This includes time spent in both face-to-face and non-face-to-face activities including preparing for the visit, reviewing the chart, time spent with patient, evaluation and counseling patient/family/caregiver, coordinating care, and time spent documenting in the chart which was performed on the date of service (12/02/2021). Note: this excludes any time spent performing billable procedures or separate charges (such as time spent counseling smoking cessation); these charges are billed separately.  ? ? ?

## 2021-12-03 ENCOUNTER — Encounter (INDEPENDENT_AMBULATORY_CARE_PROVIDER_SITE_OTHER): Payer: Medicare Other | Admitting: Ophthalmology

## 2021-12-08 ENCOUNTER — Other Ambulatory Visit: Payer: Self-pay | Admitting: Hematology and Oncology

## 2021-12-09 ENCOUNTER — Telehealth: Payer: Self-pay | Admitting: Adult Health

## 2021-12-09 NOTE — Telephone Encounter (Signed)
Left message for patient to call back and schedule Medicare Annual Wellness Visit (AWV) either virtually or in office. Left  my jabber number [phone]   Last AWV ;09/25/20  please schedule at anytime with [facility] Nurse Health Advisor 1 or 2    

## 2021-12-12 ENCOUNTER — Telehealth: Payer: Self-pay | Admitting: Neurology

## 2021-12-12 NOTE — Telephone Encounter (Signed)
Patient's son called back.

## 2021-12-12 NOTE — Telephone Encounter (Signed)
I advised son to take patient to ER for evaulation and treatment.  ?
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
## 2021-12-12 NOTE — Telephone Encounter (Signed)
No answer at 1058   ?

## 2021-12-16 ENCOUNTER — Telehealth: Payer: Self-pay | Admitting: Neurology

## 2021-12-16 NOTE — Telephone Encounter (Signed)
Is she still on the Sertraline '25mg'$  daily prescribed by PCP? Can increase that to '50mg'$  daily ?

## 2021-12-16 NOTE — Telephone Encounter (Signed)
Patients son would like a call from [place]. He doesn't want a call from anyone but Dr.Aquino. stated he needs help with his mother and doesn't know which way to go. He didn't like being told "just take her to ER to be evaluated" ?
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
## 2021-12-16 NOTE — Telephone Encounter (Signed)
Pt's son called back in stating he forgot to ask Dr. Delice Lesch if there was any kind of medication she could give the pt that would mellow her out and buy a little more time? ?

## 2021-12-18 NOTE — Telephone Encounter (Signed)
Called patients son and asked If patient is still on the Sertraline '25mg'$  daily prescribed by PCP? Can increase that to '50mg'$  daily. Patients son stated that yes patient is on 25 mg Sertraline daily and will contact patients PCP about possibly increasing medication. Patients son had no further questions or concerns.  ?

## 2021-12-24 ENCOUNTER — Encounter (INDEPENDENT_AMBULATORY_CARE_PROVIDER_SITE_OTHER): Payer: Medicare Other | Admitting: Ophthalmology

## 2021-12-24 DIAGNOSIS — D3132 Benign neoplasm of left choroid: Secondary | ICD-10-CM

## 2021-12-24 DIAGNOSIS — H43813 Vitreous degeneration, bilateral: Secondary | ICD-10-CM

## 2021-12-24 DIAGNOSIS — H353112 Nonexudative age-related macular degeneration, right eye, intermediate dry stage: Secondary | ICD-10-CM

## 2021-12-24 DIAGNOSIS — H353124 Nonexudative age-related macular degeneration, left eye, advanced atrophic with subfoveal involvement: Secondary | ICD-10-CM | POA: Diagnosis not present

## 2021-12-24 DIAGNOSIS — H35033 Hypertensive retinopathy, bilateral: Secondary | ICD-10-CM

## 2021-12-24 DIAGNOSIS — I10 Essential (primary) hypertension: Secondary | ICD-10-CM | POA: Diagnosis not present

## 2022-01-11 ENCOUNTER — Other Ambulatory Visit: Payer: Self-pay | Admitting: Adult Health

## 2022-01-13 ENCOUNTER — Telehealth: Payer: Self-pay | Admitting: Adult Health

## 2022-01-13 NOTE — Telephone Encounter (Signed)
Left message for patient to call back and schedule Medicare Annual Wellness Visit (AWV) either virtually or in office. Left  my jabber number [phone]   Last AWV ;09/25/20  please schedule at anytime with [facility] Nurse Health Advisor 1 or 2    

## 2022-01-26 ENCOUNTER — Telehealth: Payer: Self-pay | Admitting: Neurology

## 2022-01-26 NOTE — Telephone Encounter (Signed)
Patient's son [name] called and requested a call back about quetiapine 75 MG. Son said the patient just got a notification that it was only sent in for 50 MG, not the most recent updated Rx of 60 MG.  [facility]   He has been cutting 50 MG tablets in half to equal 75 MG but is needing a corrected Rx. Patient has enough for about a week left.

## 2022-01-27 ENCOUNTER — Other Ambulatory Visit: Payer: Self-pay | Admitting: Physician Assistant

## 2022-01-27 ENCOUNTER — Other Ambulatory Visit: Payer: Self-pay

## 2022-01-27 DIAGNOSIS — I10 Essential (primary) hypertension: Secondary | ICD-10-CM

## 2022-01-27 MED ORDER — QUETIAPINE FUMARATE 50 MG PO TABS: 50.0000 mg | ORAL_TABLET | Freq: Every day | ORAL | 0 refills | Status: DC

## 2022-01-27 MED ORDER — QUETIAPINE FUMARATE 50 MG PO TABS: ORAL_TABLET | ORAL | 0 refills | Status: DC

## 2022-01-27 NOTE — Telephone Encounter (Signed)
Patient advised and thanked me for calling  

## 2022-02-09 ENCOUNTER — Other Ambulatory Visit: Payer: Self-pay | Admitting: Neurology

## 2022-02-09 ENCOUNTER — Telehealth: Payer: Self-pay | Admitting: Neurology

## 2022-02-09 MED ORDER — SERTRALINE HCL 100 MG PO TABS: 100.0000 mg | ORAL_TABLET | Freq: Every day | ORAL | 3 refills | Status: DC

## 2022-02-09 MED ORDER — QUETIAPINE FUMARATE 50 MG PO TABS: ORAL_TABLET | ORAL | 3 refills | Status: DC

## 2022-02-09 NOTE — Telephone Encounter (Signed)
Spoke to son. He has not had the heart to submit the FL2 form. She is getting very mean, stays angry all the time about everything. Flies off the handle with every little thing. Discussed increasing Sertraline to '100mg'$  daily. Can also give Seroquel '50mg'$ : take 1 tablet twice a day. Discussed that medications can only help so much and he has to think about side effects and sedating her. At this point again strongly recommended moving her to Memory Care. He is having a hard time with this, recommended contacting Adult Protective Services to help with determination.  Son is asking if we can contact [employer] that we are changing the dose of Sertraline, because he just filled the '25mg'$  tablet, but we are increasing to '100mg'$  tablet. Thanks

## 2022-02-09 NOTE — Telephone Encounter (Signed)
Left message to cancel previous dose.

## 2022-02-09 NOTE — Telephone Encounter (Signed)
Patients son called, stated he wants to talk with aquino and aquino only. He stated he needs something to help his mom calm down. She still thinks people are coming in her house. She is very irate and he doesn't know what to do.

## 2022-02-25 ENCOUNTER — Telehealth: Payer: Self-pay | Admitting: Adult Health

## 2022-02-25 NOTE — Telephone Encounter (Signed)
Left message for patient to call back and schedule Medicare Annual Wellness Visit (AWV) either virtually or in office. Left  my [name] number [phone]   Last AWV ; 09/25/20 please schedule at anytime with [facility] Nurse Health Advisor 1 or 2

## 2022-03-25 ENCOUNTER — Telehealth: Payer: Self-pay | Admitting: Adult Health

## 2022-03-25 NOTE — Telephone Encounter (Signed)
Left message for patient to call back and schedule Medicare Annual Wellness Visit (AWV) either virtually or in office. Left  my [name] number [phone]   Last AWV ;09/25/20  please schedule at anytime with [facility] Nurse Health Advisor 1 or 2

## 2022-04-15 ENCOUNTER — Ambulatory Visit (INDEPENDENT_AMBULATORY_CARE_PROVIDER_SITE_OTHER): Payer: Medicare Other | Admitting: Neurology

## 2022-04-15 ENCOUNTER — Encounter: Payer: Self-pay | Admitting: Neurology

## 2022-04-15 VITALS — BP 148/68 | HR 60 | Ht 64.0 in | Wt 154.0 lb

## 2022-04-15 DIAGNOSIS — G3183 Dementia with Lewy bodies: Secondary | ICD-10-CM | POA: Diagnosis not present

## 2022-04-15 DIAGNOSIS — F02818 Dementia in other diseases classified elsewhere, unspecified severity, with other behavioral disturbance: Secondary | ICD-10-CM | POA: Diagnosis not present

## 2022-04-15 MED ORDER — QUETIAPINE FUMARATE 50 MG PO TABS: ORAL_TABLET | ORAL | 3 refills | Status: DC

## 2022-04-15 MED ORDER — DONEPEZIL HCL 10 MG PO TABS: ORAL_TABLET | ORAL | 3 refills | Status: DC

## 2022-04-15 MED ORDER — MEMANTINE HCL 10 MG PO TABS
ORAL_TABLET | ORAL | 11 refills | Status: DC
Start: 2022-04-15 — End: 2022-05-12

## 2022-04-15 MED ORDER — SERTRALINE HCL 100 MG PO TABS
100.0000 mg | ORAL_TABLET | Freq: Every day | ORAL | 3 refills | Status: DC
Start: 2022-04-15 — End: 2022-12-11
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
## 2022-04-15 NOTE — Patient Instructions (Addendum)
Start Memantine (Namenda) '10mg'$ : take 1 tablet every night for 2 weeks, then increase to 1 tablet twice a day  2. Continue Donepezil '10mg'$  daily, Quetiapine '50mg'$  twice a day, and Sertraline '100mg'$  daily  3. Recommend moving to Memory Care. Discuss concerns with our Print production planner.  4. Follow-up in 6 months, call for any changes

## 2022-04-22 ENCOUNTER — Telehealth: Payer: Self-pay | Admitting: Adult Health

## 2022-04-22 NOTE — Telephone Encounter (Signed)
Left message for patient to call back and schedule Medicare Annual Wellness Visit (AWV) either virtually or in office. Left  my [name] number [phone]   Last AWV ;09/25/20  please schedule at anytime with [facility] Nurse Health Advisor 1 or 2

## 2022-04-24 ENCOUNTER — Institutional Professional Consult (permissible substitution): Payer: Self-pay | Admitting: Licensed Clinical Social Worker

## 2022-04-30 ENCOUNTER — Telehealth: Payer: Self-pay | Admitting: Adult Health

## 2022-04-30 NOTE — Telephone Encounter (Signed)
[name] pt son dropped off Disability Placard Forms to be signed by Dr. Abbott Pao sone would like for someone to call him at [phone] once form have been filled out.   Please advise.

## 2022-05-01 ENCOUNTER — Ambulatory Visit (INDEPENDENT_AMBULATORY_CARE_PROVIDER_SITE_OTHER): Payer: Medicare Other | Admitting: Licensed Clinical Social Worker

## 2022-05-01 DIAGNOSIS — F4321 Adjustment disorder with depressed mood: Secondary | ICD-10-CM | POA: Diagnosis not present

## 2022-05-01 NOTE — Telephone Encounter (Signed)
Patient son [name] notified of update  and verbalized understanding.

## 2022-05-01 NOTE — Telephone Encounter (Signed)
Called pt son to advised that Handicap placard was ready for pick up but no answer. Left detailed message

## 2022-05-01 NOTE — BH Specialist Note (Unsigned)
Integrated Behavioral Health Initial In-Person Visit  MRN: [id number] Name: [name]  Number of [place] Clinician visits: 1- Initial Visit  Session Start time: 9458    Session End time: 1550  Total time in minutes: 60   Types of Service: Family psychotherapy  Interpretor:No. Interpretor Name and Language: NA    Warm Hand Off Completed.        Subjective: [name] is a 88 y.o. female accompanied by  Son Patient was referred by Dr. Delice Lesch  for Life Transition in finding new place to live due to memory impairment and and impact on ADL's . Patient reports the following symptoms/concerns: Verbalizes feelings of frustration, due to perceived feelings of seeing someone and they living in the ceiling and stealing her clothing .    Duration of problem: Several months ; Severity of problem: moderate  Objective: Mood: Anxious and Irritable and Affect: Appropriate Risk of harm to self or others: No plan to harm self or others  Life Context: Family and Social: Pt lives by herself in townhome and has neighbor that checks on her and son that does most Engineer, water and cooking and shopping . School/Work: Pt is retired . Self-Care: Pt is 51 and is able to do most ADL's but does have memory impairment with paranoia and hallucinations Life Changes: Pt has shown decline and, Pt son would like for her to move into assisted care or facility care for safety   Patient and/or Family's Strengths/Protective Factors: Concrete supports in place (healthy food, safe environments, etc.)  Goals Addressed: Patient will: Reduce symptoms of: agitation, anxiety, and stress Increase knowledge and/or ability of: stress reduction and Safety of patient    Demonstrate ability to: Increase healthy adjustment to current life circumstances and Increase adequate support systems for patient/family  Progress towards Goals: Ongoing  Interventions: Interventions utilized:  Motivational Interviewing, Supportive Counseling, and Link to [employer]  Standardized Assessments completed: Not Needed  Patient and/or Family Response: LCSW talked with both Pt and son and open to exploring appropriate levels of care .  Provided tips in dealing and communicating with dementia with they are having issues with paranoia and hallucinations.   Patient Centered Plan: Patient is on the following Treatment Plan(s):  To look at options in the community   Assessment: Patient currently experiencing Transition in finding new place to live due to memory impairment and and impact on ADL's . Patient reports the following symptoms/concerns: Verbalizes feelings of frustration, due to perceived feelings of seeing someone and they living in the ceiling and stealing her clothing .   Son is concern with her emotional wellbeing and with her safety .  Patient may benefit from .ealthy adjustment to current life circumstances and Increase adequate support systems for patient/family, which may include ransition in finding new place to live due to memory impairment   Plan: Follow up with behavioral health clinician on : As Needed  Behavioral recommendations: Increase Support System Referral(s): Community Resources:  Housing "From scale of 1-10, how likely are you to follow plan?": 10  [name] Hubble A Taylor-Paladino, LCSW

## 2022-05-04 ENCOUNTER — Emergency Department (HOSPITAL_COMMUNITY): Payer: Medicare Other

## 2022-05-04 ENCOUNTER — Observation Stay (HOSPITAL_COMMUNITY)
Admission: EM | Admit: 2022-05-04 | Discharge: 2022-05-05 | Disposition: A | Discharge status: Home / Self Care | Payer: Medicare Other | Attending: Family Medicine | Admitting: Family Medicine

## 2022-05-04 DIAGNOSIS — R29810 Facial weakness: Secondary | ICD-10-CM | POA: Diagnosis not present

## 2022-05-04 DIAGNOSIS — N1832 Chronic kidney disease, stage 3b: Secondary | ICD-10-CM | POA: Diagnosis not present

## 2022-05-04 DIAGNOSIS — I10 Essential (primary) hypertension: Secondary | ICD-10-CM | POA: Diagnosis not present

## 2022-05-04 DIAGNOSIS — Z79899 Other long term (current) drug therapy: Secondary | ICD-10-CM | POA: Diagnosis not present

## 2022-05-04 DIAGNOSIS — R2689 Other abnormalities of gait and mobility: Secondary | ICD-10-CM | POA: Diagnosis not present

## 2022-05-04 DIAGNOSIS — R4701 Aphasia: Secondary | ICD-10-CM | POA: Diagnosis not present

## 2022-05-04 DIAGNOSIS — F0280 Dementia in other diseases classified elsewhere without behavioral disturbance: Secondary | ICD-10-CM | POA: Diagnosis not present

## 2022-05-04 DIAGNOSIS — R5383 Other fatigue: Secondary | ICD-10-CM | POA: Insufficient documentation

## 2022-05-04 DIAGNOSIS — I440 Atrioventricular block, first degree: Secondary | ICD-10-CM | POA: Diagnosis not present

## 2022-05-04 DIAGNOSIS — E1122 Type 2 diabetes mellitus with diabetic chronic kidney disease: Secondary | ICD-10-CM | POA: Insufficient documentation

## 2022-05-04 DIAGNOSIS — E039 Hypothyroidism, unspecified: Secondary | ICD-10-CM | POA: Insufficient documentation

## 2022-05-04 DIAGNOSIS — E872 Acidosis, unspecified: Secondary | ICD-10-CM | POA: Diagnosis not present

## 2022-05-04 DIAGNOSIS — R0902 Hypoxemia: Secondary | ICD-10-CM | POA: Diagnosis not present

## 2022-05-04 DIAGNOSIS — R4781 Slurred speech: Secondary | ICD-10-CM | POA: Diagnosis present

## 2022-05-04 DIAGNOSIS — I959 Hypotension, unspecified: Secondary | ICD-10-CM | POA: Diagnosis not present

## 2022-05-04 DIAGNOSIS — I4430 Unspecified atrioventricular block: Secondary | ICD-10-CM | POA: Diagnosis not present

## 2022-05-04 DIAGNOSIS — E1165 Type 2 diabetes mellitus with hyperglycemia: Secondary | ICD-10-CM | POA: Diagnosis not present

## 2022-05-04 DIAGNOSIS — Z87891 Personal history of nicotine dependence: Secondary | ICD-10-CM | POA: Diagnosis not present

## 2022-05-04 DIAGNOSIS — I129 Hypertensive chronic kidney disease with stage 1 through stage 4 chronic kidney disease, or unspecified chronic kidney disease: Secondary | ICD-10-CM | POA: Insufficient documentation

## 2022-05-04 DIAGNOSIS — R531 Weakness: Principal | ICD-10-CM

## 2022-05-04 DIAGNOSIS — G3183 Dementia with Lewy bodies: Secondary | ICD-10-CM | POA: Insufficient documentation

## 2022-05-04 DIAGNOSIS — R4182 Altered mental status, unspecified: Secondary | ICD-10-CM | POA: Insufficient documentation

## 2022-05-04 DIAGNOSIS — G929 Unspecified toxic encephalopathy: Secondary | ICD-10-CM | POA: Diagnosis not present

## 2022-05-04 DIAGNOSIS — D631 Anemia in chronic kidney disease: Secondary | ICD-10-CM | POA: Diagnosis not present

## 2022-05-04 LAB — COMPREHENSIVE METABOLIC PANEL
ALT: 16 U/L (ref 0–44)
AST: 17 U/L (ref 15–41)
Albumin: 3.7 g/dL (ref 3.5–5.0)
Alkaline Phosphatase: 90 U/L (ref 38–126)
Anion gap: 9 (ref 5–15)
BUN: 20 mg/dL (ref 8–23)
CO2: 21 mmol/L — ABNORMAL LOW (ref 22–32)
Calcium: 8.6 mg/dL — ABNORMAL LOW (ref 8.9–10.3)
Chloride: 106 mmol/L (ref 98–111)
Creatinine, Ser: 1.32 mg/dL — ABNORMAL HIGH (ref 0.44–1.00)
GFR, Estimated: 38 mL/min — ABNORMAL LOW (ref >60)
Glucose, Bld: 166 mg/dL — ABNORMAL HIGH (ref 70–99)
Potassium: 3.9 mmol/L (ref 3.5–5.1)
Sodium: 136 mmol/L (ref 135–145)
Total Bilirubin: 0.4 mg/dL (ref 0.3–1.2)
Total Protein: 6.5 g/dL (ref 6.5–8.1)

## 2022-05-04 LAB — I-STAT CHEM 8, ED
BUN: 22 mg/dL (ref 8–23)
Calcium, Ion: 1.08 mmol/L — ABNORMAL LOW (ref 1.15–1.40)
Chloride: 105 mmol/L (ref 98–111)
Creatinine, Ser: 1.5 mg/dL — ABNORMAL HIGH (ref 0.44–1.00)
Glucose, Bld: 165 mg/dL — ABNORMAL HIGH (ref 70–99)
HCT: 31 % — ABNORMAL LOW (ref 36.0–46.0)
Hemoglobin: 10.5 g/dL — ABNORMAL LOW (ref 12.0–15.0)
Potassium: 3.9 mmol/L (ref 3.5–5.1)
Sodium: 136 mmol/L (ref 135–145)
TCO2: 21 mmol/L — ABNORMAL LOW (ref 22–32)

## 2022-05-04 LAB — DIFFERENTIAL
Abs Immature Granulocytes: 0.02 10*3/uL (ref 0.00–0.07)
Basophils Absolute: 0.1 10*3/uL (ref 0.0–0.1)
Basophils Relative: 1 %
Eosinophils Absolute: 0.2 10*3/uL (ref 0.0–0.5)
Eosinophils Relative: 3 %
Immature Granulocytes: 0 %
Lymphocytes Relative: 22 %
Lymphs Abs: 1.6 10*3/uL (ref 0.7–4.0)
Monocytes Absolute: 0.8 10*3/uL (ref 0.1–1.0)
Monocytes Relative: 10 %
Neutro Abs: 4.8 10*3/uL (ref 1.7–7.7)
Neutrophils Relative %: 64 %

## 2022-05-04 LAB — CBC
HCT: 32.2 % — ABNORMAL LOW (ref 36.0–46.0)
Hemoglobin: 10.1 g/dL — ABNORMAL LOW (ref 12.0–15.0)
MCH: 29.9 pg (ref 26.0–34.0)
MCHC: 31.4 g/dL (ref 30.0–36.0)
MCV: 95.3 fL (ref 80.0–100.0)
Platelets: 256 10*3/uL (ref 150–400)
RBC: 3.38 MIL/uL — ABNORMAL LOW (ref 3.87–5.11)
RDW: 14.2 % (ref 11.5–15.5)
WBC: 7.5 10*3/uL (ref 4.0–10.5)
nRBC: 0 % (ref 0.0–0.2)

## 2022-05-04 LAB — ETHANOL: Alcohol, Ethyl (B): <10 mg/dL (ref <10)

## 2022-05-04 LAB — PROTIME-INR
INR: 1 (ref 0.8–1.2)
Prothrombin Time: 13.2 seconds (ref 11.4–15.2)

## 2022-05-04 LAB — CBG MONITORING, ED: Glucose-Capillary: 159 mg/dL — ABNORMAL HIGH (ref 70–99)

## 2022-05-04 LAB — APTT: aPTT: 31 seconds (ref 24–36)

## 2022-05-04 MED ORDER — ONE-A-DAY WOMENS 50+ PO TABS: 1.0000 | ORAL_TABLET | Freq: Every day | ORAL | Status: DC

## 2022-05-04 MED ORDER — LORAZEPAM 2 MG/ML IJ SOLN
0.5000 mg | Freq: Once | INTRAMUSCULAR | Status: AC
  Administered 2022-05-04: 0.5 mg via INTRAVENOUS
  Filled 2022-05-04: qty 1

## 2022-05-04 MED ORDER — ENOXAPARIN SODIUM 40 MG/0.4ML IJ SOSY: 40.0000 mg | PREFILLED_SYRINGE | INTRAMUSCULAR | Status: DC

## 2022-05-04 MED ORDER — POLYETHYL GLYC-PROPYL GLYC PF 0.4-0.3 % OP SOLN: 1.0000 [drp] | Freq: Three times a day (TID) | OPHTHALMIC | Status: DC | PRN

## 2022-05-04 MED ORDER — POLYVINYL ALCOHOL 1.4 % OP SOLN: 1.0000 [drp] | OPHTHALMIC | Status: DC | PRN

## 2022-05-04 MED ORDER — PANTOPRAZOLE SODIUM 40 MG PO TBEC
40.0000 mg | DELAYED_RELEASE_TABLET | Freq: Every day | ORAL | Status: DC
  Administered 2022-05-04 – 2022-05-05 (×2): 40 mg via ORAL
  Filled 2022-05-04 (×2): qty 1

## 2022-05-04 MED ORDER — LEVOTHYROXINE SODIUM 75 MCG PO TABS
150.0000 ug | ORAL_TABLET | Freq: Every day | ORAL | Status: DC
  Administered 2022-05-05: 150 ug via ORAL
  Filled 2022-05-04: qty 2

## 2022-05-04 MED ORDER — SODIUM CHLORIDE 0.9% FLUSH: 3.0000 mL | Freq: Once | INTRAVENOUS | Status: DC

## 2022-05-04 MED ORDER — LABETALOL HCL 5 MG/ML IV SOLN: 5.0000 mg | INTRAVENOUS | Status: DC | PRN

## 2022-05-04 MED ORDER — PRESERVISION AREDS 2 PO CAPS: ORAL_CAPSULE | Freq: Every day | ORAL | Status: DC

## 2022-05-04 MED ORDER — DONEPEZIL HCL 10 MG PO TABS
10.0000 mg | ORAL_TABLET | Freq: Every day | ORAL | Status: DC
  Administered 2022-05-04: 10 mg via ORAL
  Filled 2022-05-04: qty 1

## 2022-05-04 MED ORDER — ENOXAPARIN SODIUM 30 MG/0.3ML IJ SOSY
30.0000 mg | PREFILLED_SYRINGE | INTRAMUSCULAR | Status: DC
  Administered 2022-05-04: 30 mg via SUBCUTANEOUS
  Filled 2022-05-04: qty 0.3

## 2022-05-04 MED ORDER — ADULT MULTIVITAMIN W/MINERALS CH
1.0000 | ORAL_TABLET | Freq: Every day | ORAL | Status: DC
  Administered 2022-05-05: 1 via ORAL
  Filled 2022-05-04: qty 1

## 2022-05-04 MED ORDER — MEMANTINE HCL 10 MG PO TABS
10.0000 mg | ORAL_TABLET | Freq: Two times a day (BID) | ORAL | Status: DC
  Administered 2022-05-04 – 2022-05-05 (×2): 10 mg via ORAL
  Filled 2022-05-04 (×2): qty 1

## 2022-05-04 MED ORDER — QUETIAPINE FUMARATE 25 MG PO TABS
50.0000 mg | ORAL_TABLET | Freq: Two times a day (BID) | ORAL | Status: DC
  Administered 2022-05-04 – 2022-05-05 (×2): 50 mg via ORAL
  Filled 2022-05-04 (×3): qty 2

## 2022-05-04 MED ORDER — SERTRALINE HCL 100 MG PO TABS
100.0000 mg | ORAL_TABLET | Freq: Every day | ORAL | Status: DC
  Administered 2022-05-05: 100 mg via ORAL
  Filled 2022-05-04: qty 1

## 2022-05-04 MED ORDER — SODIUM CHLORIDE 0.9 % IV SOLN: INTRAVENOUS | Status: DC

## 2022-05-04 MED ORDER — PROSIGHT PO TABS
1.0000 | ORAL_TABLET | Freq: Every day | ORAL | Status: DC
  Filled 2022-05-04 (×2): qty 1

## 2022-05-04 NOTE — Consult Note (Signed)
Neurology Consultation  Reason for Consult: Code Stroke Referring Physician: Dr. Laverta Baltimore  CC: Slight drowsiness, generalized weakness, possible right mouth droop  History is obtained from: EMS, chart review, some history provided by patient   HPI: [name] is a 88 y.o. female with a medical history significant for chronic renal insufficiency stage III, depression, hypertension, hypothyroidism, Lewy body dementia with hallucinations and paranoid delusions- last MMSE 04/15/2022 of 16/30 (previously 17/30 in September 2022) followed by [facility] Neurology who presented to the ED for evaluation of weakness, lethargy, altered mental status, and possible right mouth droop after taking two days dosing of medications accidentally at home today. Reported medications included sertraline 100 mg, quetiapine 50 mg, pregabalin 25 mg, omeprazole, 40 mg memantine 10 mg, levothyroxine 150 mcg, donepezil 10 mg, and amlodipine 10 mg. She took the extra medications at 16:00 and had subsequent reported sleepiness, weakness, and altered mental status. On EMS arrival, there was some concern for a right mouth droop, right eyelid drooping, and possibly some slight weakness of the right lower extremity on assisted ambulation to the EMS stretcher. EMS activated a Code Stroke for further evaluation.   LKW: 16:00 TNK given?: no, presentation is felt to be more consistent with polypharmacy rather than acute stroke. Symptoms are felt to be mild with BLE drift with pain limited strength assessment and mild naming impairment felt to be related to vision impairment and not related to aphasia.  IR Thrombectomy? No, presentation is not consistent with LVO Modified Rankin Scale: 2-Slight disability-UNABLE to perform all activities but does not need assistance  ROS: A complete ROS was performed and is negative except as noted in the HPI.  Past Medical History:  Diagnosis Date   Anemia    Chronic fatigue    Chronic renal insufficiency,  stage III (moderate) ([place])    Depression    HYPERTENSION 04/04/2007   HYPOTHYROIDISM 04/04/2007   Lewy body dementia ([place])    Macular degeneration    Past Surgical History:  Procedure Laterality Date   ABDOMINAL HYSTERECTOMY     APPENDECTOMY     BACK SURGERY  2012   Cataracts     CHOLECYSTECTOMY     ERCP N/A 11/22/2014   Procedure: ENDOSCOPIC RETROGRADE CHOLANGIOPANCREATOGRAPHY (ERCP);  Surgeon: Clarene Essex, MD;  Location: [facility] ENDOSCOPY;  Service: Endoscopy;  Laterality: N/A;   HERNIA REPAIR     OOPHORECTOMY     ORIF TIBIA FRACTURE  2012   dr Sharol Given   SPINE SURGERY  2011   cervical   Family History  Problem Relation Age of Onset   Kidney cancer Mother 42       renal cell CA   Heart disease Father    Lymphoma Son    Breast cancer Daughter    Social History:   reports that she quit smoking about 52 years ago. Her smoking use included cigarettes. She has a 7.00 pack-year smoking history. She has never used smokeless tobacco. She reports that she does not drink alcohol and does not use drugs.  Medications  Current Facility-Administered Medications:    sodium chloride flush (NS) 0.9 % injection 3 mL, 3 mL, Intravenous, Once, Long, Wonda Olds, MD  Current Outpatient Medications:    amLODipine (NORVASC) 10 MG tablet, TAKE 1 TABLET BY MOUTH  DAILY, Disp: 60 tablet, Rfl: 5   donepezil (ARICEPT) 10 MG tablet, Take 1 tablet daily, Disp: 90 tablet, Rfl: 3   levothyroxine (SYNTHROID) 150 MCG tablet, TAKE 1 TABLET BY MOUTH  DAILY, Disp:  60 tablet, Rfl: 5   memantine (NAMENDA) 10 MG tablet, Take 1 tablet every night for 2 weeks, then increase to 1 tablet twice a day, Disp: 60 tablet, Rfl: 11   Multiple Vitamin (MULTIVITAMIN) tablet, Take 1 tablet by mouth daily., Disp: , Rfl:    Multiple Vitamins-Minerals (PRESERVISION AREDS 2 PO), Take by mouth., Disp: , Rfl:    olmesartan (BENICAR) 20 MG tablet, TAKE 1 TABLET BY MOUTH  DAILY, Disp: 60 tablet, Rfl: 5   omeprazole (PRILOSEC) 40 MG capsule,  TAKE 1 CAPSULE BY MOUTH  DAILY, Disp: 60 capsule, Rfl: 5   QUEtiapine (SEROQUEL) 50 MG tablet, Take 1 tablet twice a day, Disp: 180 tablet, Rfl: 3   sertraline (ZOLOFT) 100 MG tablet, Take 1 tablet (100 mg total) by mouth daily., Disp: 90 tablet, Rfl: 3  Exam: Current vital signs: Ht '5\' 4"'$  (1.626 m)   Wt 72.2 kg   BMI 27.32 kg/m  Vital signs in last 24 hours: Weight:  [72.2 kg] 72.2 kg (09/25 1700)  GENERAL: Awake, alert, in no acute distress Psych: Affect appropriate for situation, patient is calm and cooperative with examination Head: Normocephalic and atraumatic, without obvious abnormality EENT: Normal conjunctivae, dry mucous membranes, no OP obstruction LUNGS: Normal respiratory effort. Non-labored breathing on room air CV: Regular rate and rhythm on telemetry ABDOMEN: Soft, non-tender, non-distended Extremities: Warm, well perfused, without obvious deformity  NEURO:  Mental Status: Awake, alert, and oriented to self, age, and month Patient states that she did not purposely take extra medications.  Speech/Language: speech is fluent without dysarthria. She has some trouble naming objects on the stroke cards though it is felt to be related to visual impairment rather than aphasia. She wears glasses at home that were not present during assessment.  No neglect is noted. Cranial Nerves:  II: PERRL. Visual fields full.  III, IV, VI: EOMI. V: Sensation is intact to light touch and symmetrical to face.  VII: Face is symmetric resting and smiling. Intermittently appears to have slightly less elevation of the right mouth with smiling.  VIII: Hearing intact to voice IX, X: Palate elevation is symmetric. Phonation normal.  XI: Normal sternocleidomastoid and trapezius muscle strength XII: Tongue protrudes midline without fasciculations.   Motor: 5/5 strength present in bilateral upper extremities, bilateral lower extremity strength assessment limited by pain. With antigravity movement  bilaterally patient winces and cries out in pain due to reported sciatica. No overt asymmetry noted.  Tone is normal. Bulk is normal.  Sensation: Intact to light touch bilaterally in all four extremities. No extinction to DSS present.  Coordination: No dysmetria noted with limited assessment of BLE due to pain  Gait: Deferred for patient safety  NIHSS: 1a Level of Conscious.: 0 1b LOC Questions: 0 1c LOC Commands: 0 2 Best Gaze: 0 3 Visual: 0 4 Facial Palsy: 0 5a Motor Arm - left: 0 5b Motor Arm - Right: 0 6a Motor Leg - Left: 1 ; BLE strength assessment limited due to reported sciatic pain  6b Motor Leg - Right: 1 ; BLE strength assessment limited due to reported sciatic pain  7 Limb Ataxia: 0 8 Sensory: 0 9 Best Language: 1; felt to be related to visual impairment rather than aphasia 10 Dysarthria: 0 11 Extinct. and Inatten.: 0 TOTAL: 3  Labs I have reviewed labs in epic and the results pertinent to this consultation are: CBC    Component Value Date/Time   WBC 7.5 05/04/2022 1715   RBC 3.38 (L) 05/04/2022  1715   HGB 10.5 (L) 05/04/2022 1719   HGB 10.4 (L) 11/11/2021 1635   HCT 31.0 (L) 05/04/2022 1719   PLT 256 05/04/2022 1715   PLT 281 11/11/2021 1635   MCV 95.3 05/04/2022 1715   MCH 29.9 05/04/2022 1715   MCHC 31.4 05/04/2022 1715   RDW 14.2 05/04/2022 1715   LYMPHSABS 1.6 05/04/2022 1715   MONOABS 0.8 05/04/2022 1715   EOSABS 0.2 05/04/2022 1715   BASOSABS 0.1 05/04/2022 1715   CMP     Component Value Date/Time   NA 136 05/04/2022 1719   K 3.9 05/04/2022 1719   CL 105 05/04/2022 1719   CO2 21 (L) 11/11/2021 1635   GLUCOSE 165 (H) 05/04/2022 1719   BUN 22 05/04/2022 1719   CREATININE 1.50 (H) 05/04/2022 1719   CREATININE 1.38 (H) 11/11/2021 1635   CALCIUM 9.1 11/11/2021 1635   PROT 6.9 11/11/2021 1635   ALBUMIN 4.2 11/11/2021 1635   ALBUMIN Not Applicable 63/87/5643 3295   AST 12 (L) 11/11/2021 1635   ALT 16 11/11/2021 1635   ALKPHOS 66 11/11/2021  1635   BILITOT 0.4 11/11/2021 1635   GFRNONAA 36 (L) 11/11/2021 1635   GFRAA 43 (L) 11/23/2014 0504   Lipid Panel     Component Value Date/Time   CHOL 173 03/31/2019 0956   TRIG 91.0 03/31/2019 0956   HDL 44.50 03/31/2019 0956   CHOLHDL 4 03/31/2019 0956   VLDL 18.2 03/31/2019 0956   LDLCALC 110 (H) 03/31/2019 0956   No results found for: "HGBA1C"  Imaging I have reviewed the images obtained:  CT-scan of the brain 9/25: No evidence of acute intracranial abnormality. Mild chronic small vessel ischemic changes within the cerebral white matter. Mild-to-moderate generalized cerebral atrophy  MRI examination of the brain pending   Assessment: 88 y.o. female with PMHx of Lewy body dementia, HTN, chronic renal insufficiency stage III, depression, and hypothyroidism presents for evaluation of drowsiness with AMS following accidental ingestion of a second dose of daily medications today at 16:00. On EMS arrival, there was some concern for mild right facial droop and possible right lower extremity weakness when ambulating to the EMS stretcher and a Code Stroke was activated.  - Examination reveals patient that is awake, alert, conversant. Her NIHSS of 3 was felt to be limited. Her BLE strength exam is limited due to reported sciatic pain with movement and she had some difficulty naming objects though it was felt that her limitation was due to visual impairment rather than aphasia (wears glasses at baseline that were not present during exam).  - Due to her exam, etiology is felt to be more likely associated with polypharmacy rather than acute neurologic decline. TNK was not administered as there is low suspicion for acute stroke at this time with mild symptoms. Patient is not a candidate for IR as her presentation is not concerning for an LVO. Will obtain MRI brain to definitively rule out ischemia as etiology of presentation.  - On chart review, her outpatient neurologist has breeched the subject  of moving Ms. Guthrie to a higher level of care (currently lives at home alone) due to progressive memory loss, hallucinations, and paranoid delusions and she was referred to social work outpatient.   Recommendations: - Management of polypharmacy side effects per ED - Close monitoring in the ED until patient is out of thrombolytic therapy window - MRI brain when able to obtain  - Will expand stroke work up if MRI brain with acute findings  Anibal Henderson, AGAC-NP [facility] Pager: [phone]   NEUROHOSPITALIST ADDENDUM Performed a face to face diagnostic evaluation.   I have reviewed the contents of history and physical exam as documented by PA/ARNP/Resident and agree with above documentation.  I have discussed and formulated the above plan as documented. Edits to the note have been made as needed.  Impression/Key exam findings/Plan: 88 y/o F who took 2 days of meds all at the same time and now encephalopathic. No obvious focal deficit except for concerns for a mild R facial droop vs facial fold. No obvious aphasia but hard to assess given she left her glasses but she can identify things in her hand. Likely polypharmacy. Recommend MRI Brain without contrast and if negative, no further neurological workup.  Donnetta Simpers, MD [facility] [phone]   If 7pm to 7am, please call on call as listed on AMION.

## 2022-05-04 NOTE — H&P (Signed)
History and Physical  [name] IRW:[id number] DOB: 1929-07-26 DOA: 05/04/2022  Referring physician: Dr. Oswaldo Milian, resident, [place]  PCP: Dorothyann Peng, NP  Outpatient Specialists: Neurology Patient coming from: Home, lives alone. Chief Complaint: R sided weakness, code stroke.  HPI: [name] is a 88 y.o. female with medical history significant for Lewy body dementia, hypertension, CKD 3B, who presented to [facility] ED as a code stroke due to acute onset of right sided weakness noted by home health aide at her home.  Last known well at 1600 today.  Code stroke was activated by EMS.    Per EMS encounter, there was concern for right mouth droop, right eyelid droop and possibly weakness of the right lower extremity on assisted ambulation.  The patient was brought into the ED and evaluated by neurology/stroke team.  CT head was nonacute.  Per her son at bedside the patient accidentally took 2 days worth of her dementia medications, memantine and Seroquel.  Upon assessment, neurology recommended MRI brain when able to obtain, plan to expand stroke work-up if MRI brain has acute findings.  The patient was admitted by [facility], hospitalist service.  ED Course: Tmax 97.8.  BP 160/57, pulse 59, respiration rate 22, O2 saturation 98% on room air.  Lab studies remarkable for glucose 165, creatinine 1.50, GFR 38, with baseline creatinine of 1.3.  Hemoglobin 10.5.  Review of Systems: Review of systems as noted in the HPI. All other systems reviewed and are negative.   Past Medical History:  Diagnosis Date   Anemia    Chronic fatigue    Chronic renal insufficiency, stage III (moderate) ([place])    Depression    HYPERTENSION 04/04/2007   HYPOTHYROIDISM 04/04/2007   Lewy body dementia ([place])    Macular degeneration    Past Surgical History:  Procedure Laterality Date   ABDOMINAL HYSTERECTOMY     APPENDECTOMY     BACK SURGERY  2012   Cataracts     CHOLECYSTECTOMY     ERCP N/A 11/22/2014   Procedure: ENDOSCOPIC  RETROGRADE CHOLANGIOPANCREATOGRAPHY (ERCP);  Surgeon: Clarene Essex, MD;  Location: [facility] ENDOSCOPY;  Service: Endoscopy;  Laterality: N/A;   HERNIA REPAIR     OOPHORECTOMY     ORIF TIBIA FRACTURE  2012   dr Sharol Given   SPINE SURGERY  2011   cervical    Social History:  reports that she quit smoking about 52 years ago. Her smoking use included cigarettes. She has a 7.00 pack-year smoking history. She has never used smokeless tobacco. She reports that she does not drink alcohol and does not use drugs.   Allergies  Allergen Reactions   Oxycodone Hcl Itching    Family History  Problem Relation Age of Onset   Kidney cancer Mother 62       renal cell CA   Heart disease Father    Lymphoma Son    Breast cancer Daughter       Prior to Admission medications   Medication Sig Start Date End Date Taking? Authorizing Provider  amLODipine (NORVASC) 10 MG tablet TAKE 1 TABLET BY MOUTH  DAILY Patient taking differently: Take 10 mg by mouth in the morning. 08/12/21  Yes Nafziger, [name], NP  diphenhydramine-acetaminophen (TYLENOL PM) 25-500 MG TABS tablet Take 1 tablet by mouth at bedtime as needed (for pain/sleep).   Yes [provider]  donepezil (ARICEPT) 10 MG tablet Take 1 tablet daily Patient taking differently: Take 10 mg by mouth at bedtime. Take 1 tablet daily 04/15/22  Yes Cameron Sprang, MD  levothyroxine (SYNTHROID) 150 MCG tablet TAKE 1 TABLET BY MOUTH  DAILY Patient taking differently: Take 150 mcg by mouth daily before breakfast. 08/12/21  Yes Nafziger, [name], NP  memantine (NAMENDA) 10 MG tablet Take 1 tablet every night for 2 weeks, then increase to 1 tablet twice a day Patient taking differently: Take 10 mg by mouth in the morning and at bedtime. 04/15/22  Yes Cameron Sprang, MD  Multiple Vitamins-Minerals (ONE-A-DAY WOMENS 50+) TABS Take 1 tablet by mouth daily with breakfast.   Yes [provider]  Multiple Vitamins-Minerals (PRESERVISION AREDS 2 PO) Take 1 capsule by mouth  daily with breakfast.   Yes [provider]  olmesartan (BENICAR) 20 MG tablet TAKE 1 TABLET BY MOUTH  DAILY Patient taking differently: Take 20 mg by mouth in the morning. 08/12/21  Yes Nafziger, [name], NP  omeprazole (PRILOSEC) 40 MG capsule TAKE 1 CAPSULE BY MOUTH  DAILY Patient taking differently: Take 40 mg by mouth daily before breakfast. 08/12/21  Yes Nafziger, [name], NP  QUEtiapine (SEROQUEL) 50 MG tablet Take 1 tablet twice a day Patient taking differently: Take 50 mg by mouth in the morning and at bedtime. 04/15/22  Yes Cameron Sprang, MD  sertraline (ZOLOFT) 100 MG tablet Take 1 tablet (100 mg total) by mouth daily. 04/15/22  Yes Cameron Sprang, MD  SYSTANE ULTRA PF 0.4-0.3 % SOLN Place 1 drop into both eyes 3 (three) times daily as needed (for dryness).   Yes [provider]  TYLENOL 500 MG tablet Take 500 mg by mouth every 6 (six) hours as needed for mild pain or headache.   Yes [provider]    Physical Exam: BP (!) 169/45   Pulse 61   Temp 97.8 F (36.6 C) (Oral)   Resp 16   Ht '5\' 4"'$  (1.626 m)   Wt 72.2 kg   SpO2 98%   BMI 27.32 kg/m   General: 89 y.o. year-old female well developed well nourished in no acute distress.  Alert and confused in the setting of dementia. Cardiovascular: Regular rate and rhythm with no rubs or gallops.  No thyromegaly or JVD noted.  No lower extremity edema. 2/4 pulses in all 4 extremities. Respiratory: Clear to auscultation with no wheezes or rales. Good inspiratory effort. Abdomen: Soft nontender nondistended with normal bowel sounds x4 quadrants. Muskuloskeletal: No cyanosis, clubbing or edema noted bilaterally Neuro: CN II-XII intact, sensation, reflexes.  The patient does not follow commands consistently.  Equal strength in upper extremities bilaterally.  No significant focal deficits noted, weakness appears improved. Skin: No ulcerative lesions noted or rashes Psychiatry: Judgement and insight appear altered. Mood is  appropriate for condition and setting          Labs on Admission:  Basic Metabolic Panel: Recent Labs  Lab 05/04/22 1715 05/04/22 1719  NA 136 136  K 3.9 3.9  CL 106 105  CO2 21*  --   GLUCOSE 166* 165*  BUN 20 22  CREATININE 1.32* 1.50*  CALCIUM 8.6*  --    Liver Function Tests: Recent Labs  Lab 05/04/22 1715  AST 17  ALT 16  ALKPHOS 90  BILITOT 0.4  PROT 6.5  ALBUMIN 3.7   No results for input(s): "LIPASE", "AMYLASE" in the last 168 hours. No results for input(s): "AMMONIA" in the last 168 hours. CBC: Recent Labs  Lab 05/04/22 1715 05/04/22 1719  WBC 7.5  --   NEUTROABS 4.8  --  HGB 10.1* 10.5*  HCT 32.2* 31.0*  MCV 95.3  --   PLT 256  --    Cardiac Enzymes: No results for input(s): "CKTOTAL", "CKMB", "CKMBINDEX", "TROPONINI" in the last 168 hours.  BNP (last 3 results) No results for input(s): "BNP" in the last 8760 hours.  ProBNP (last 3 results) No results for input(s): "PROBNP" in the last 8760 hours.  CBG: Recent Labs  Lab 05/04/22 1714  GLUCAP 159*    Radiological Exams on Admission: CT HEAD CODE STROKE WO CONTRAST  Result Date: 05/04/2022 CLINICAL DATA:  Code stroke. Neuro deficit, acute, stroke suspected. Right-sided facial droop. Right-sided weakness. EXAM: CT HEAD WITHOUT CONTRAST TECHNIQUE: Contiguous axial images were obtained from the base of the skull through the vertex without intravenous contrast. RADIATION DOSE REDUCTION: This exam was performed according to the departmental dose-optimization program which includes automated exposure control, adjustment of the mA and/or kV according to patient size and/or use of iterative reconstruction technique. COMPARISON:  Brain MRI 03/10/2020.  Head CT 10/17/2014. FINDINGS: Brain: Mild-to-moderate generalized cerebral atrophy. Mild patchy and ill-defined hypoattenuation within the cerebral white matter, nonspecific but compatible chronic small vessel ischemic disease. There is no acute  intracranial hemorrhage. No demarcated cortical infarct. No extra-axial fluid collection. No evidence of an intracranial mass. No midline shift. Vascular: Hyperdense vessel.  Atherosclerotic calcifications. Skull: No fracture or aggressive osseous lesion. Sinuses/Orbits: No mass or acute finding within the imaged orbits. No significant paranasal sinus disease at the imaged levels. ASPECTS [facility] Stroke Program Early CT Score) - Ganglionic level infarction (caudate, lentiform nuclei, internal capsule, insula, M1-M3 cortex): 7 - Supraganglionic infarction (M4-M6 cortex): 3 Total score (0-10 with 10 being normal): 10 These results were communicated to Dr. Lorrin Goodell At 5:35 pmon 9/25/2023by text page via the [facility] messaging system. IMPRESSION: No evidence of acute intracranial abnormality. Mild chronic small vessel ischemic changes within the cerebral white matter. Mild-to-moderate generalized cerebral atrophy Electronically Signed   By: Kellie Simmering D.O.   On: 05/04/2022 17:36    EKG: I independently viewed the EKG done and my findings are as followed: Sinus rhythm rate of 63.  Nonspecific ST-T changes.  QTc 447.  Assessment/Plan Present on Admission: **None**  Principal Problem:   Right sided weakness  Right-sided weakness, rule out CVA CT head non acute MRI ordered, follow, if positive for CVA, then proceed with full stroke work-up, as recommended by neurology. PT/OT/speech therapist assessment Fall and aspiration precautions  Lewy body dementia, progressive Per her son her dementia has worsened she has become more aggressive One-to-one sitter Fall precautions PT/OT evaluation TOC consulted to assist with DC planning The patient's son is requesting placement due to progression of her dementia.  Essential hypertension Resume home oral antihypertensives if MRI is negative for acute stroke Currently permissive hypertension until acute stroke is ruled out Treat SBP greater than 220 or DBP  greater than 120. Closely monitor vital signs  Type 2 diabetes with hyperglycemia Obtain hemoglobin A1c Start insulin sliding scale.  CKD 3B Renal function is close to baseline GFR 38. Gentle IV fluid hydration NS at 50 cc/h x 1 day.  Non-anion gap metabolic acidosis Serum bicarb 21, anion gap 9 Gentle IV fluid hydration  Anemia of chronic disease Hemoglobin at baseline 10.5.   No overt bleeding reported   DVT prophylaxis: Subcu Lovenox daily  Code Status: Full code  Family Communication: Updated her son at bedside  Disposition Plan: Admitted to telemetry medical unit  Consults called: Neurology/stroke team  Admission status:  Observation status   Status is: Observation    Kayleen Memos MD [facility] Pager [phone]  If 7PM-7AM, please contact night-coverage [email or web address] Password [facility]  05/04/2022, 7:53 PM

## 2022-05-04 NOTE — ED Triage Notes (Signed)
Pt BIB [facility] as a Code Stroke.  LKW approx. 1600.  PT presented with difficulty with vision focus, droopy right eyelid. right side weakness and rt side facial droop.  EMS states pt was eating a honey bun when they arrived on scene.  Blood glucose was 165.  Son told EMS that it looks like she took 2 days of her medication accidentally based on her med box.

## 2022-05-04 NOTE — ED Notes (Signed)
Ambulated to restroom with 1to1 assistance

## 2022-05-04 NOTE — ED Notes (Signed)
Refused to go to MRI tonight.

## 2022-05-04 NOTE — ED Provider Notes (Signed)
[facility] EMERGENCY DEPARTMENT Provider Note   CSN: [id number] Arrival date & time: 05/04/22  1711  An emergency department physician performed an initial assessment on this suspected stroke patient at 1713.  History  No chief complaint on file.   [name] is a 88 y.o. female.  HPI  Per EMS report, patient excellently took multiple days of her home medications.  EMS called due to patient with decreased responsiveness, slurred speech, word finding difficulty, right-sided facial droop.  Symptoms started approximately 1 hour prior to EMS arrival.  On EMS arrival, patient was also noted to have right lower extremity weakness and difficulty ambulating.  Patient monitored glucose 175.  Patient states that she feels generally well, she is not having any headache, nausea, vomiting, fevers, chills, abd pain.     Home Medications Prior to Admission medications   Medication Sig Start Date End Date Taking? Authorizing Provider  amLODipine (NORVASC) 10 MG tablet TAKE 1 TABLET BY MOUTH  DAILY Patient taking differently: Take 10 mg by mouth in the morning. 08/12/21  Yes Nafziger, [name], NP  diphenhydramine-acetaminophen (TYLENOL PM) 25-500 MG TABS tablet Take 1 tablet by mouth at bedtime as needed (for pain/sleep).   Yes [provider]  donepezil (ARICEPT) 10 MG tablet Take 1 tablet daily Patient taking differently: Take 10 mg by mouth at bedtime. Take 1 tablet daily 04/15/22  Yes Cameron Sprang, MD  levothyroxine (SYNTHROID) 150 MCG tablet TAKE 1 TABLET BY MOUTH  DAILY Patient taking differently: Take 150 mcg by mouth daily before breakfast. 08/12/21  Yes Nafziger, [name], NP  memantine (NAMENDA) 10 MG tablet Take 1 tablet every night for 2 weeks, then increase to 1 tablet twice a day Patient taking differently: Take 10 mg by mouth in the morning and at bedtime. 04/15/22  Yes Cameron Sprang, MD  Multiple Vitamins-Minerals (ONE-A-DAY WOMENS 50+) TABS Take 1 tablet by mouth  daily with breakfast.   Yes [provider]  Multiple Vitamins-Minerals (PRESERVISION AREDS 2 PO) Take 1 capsule by mouth daily with breakfast.   Yes [provider]  olmesartan (BENICAR) 20 MG tablet TAKE 1 TABLET BY MOUTH  DAILY Patient taking differently: Take 20 mg by mouth in the morning. 08/12/21  Yes Nafziger, [name], NP  omeprazole (PRILOSEC) 40 MG capsule TAKE 1 CAPSULE BY MOUTH  DAILY Patient taking differently: Take 40 mg by mouth daily before breakfast. 08/12/21  Yes Nafziger, [name], NP  QUEtiapine (SEROQUEL) 50 MG tablet Take 1 tablet twice a day Patient taking differently: Take 50 mg by mouth in the morning and at bedtime. 04/15/22  Yes Cameron Sprang, MD  sertraline (ZOLOFT) 100 MG tablet Take 1 tablet (100 mg total) by mouth daily. 04/15/22  Yes Cameron Sprang, MD  SYSTANE ULTRA PF 0.4-0.3 % SOLN Place 1 drop into both eyes 3 (three) times daily as needed (for dryness).   Yes [provider]  TYLENOL 500 MG tablet Take 500 mg by mouth every 6 (six) hours as needed for mild pain or headache.   Yes [provider]      Allergies    Oxycodone hcl    Review of Systems   Review of Systems  Physical Exam Updated Vital Signs BP (!) 169/45   Pulse 61   Temp 97.8 F (36.6 C) (Oral)   Resp 16   Ht '5\' 4"'$  (1.626 m)   Wt 72.2 kg   SpO2 98%   BMI 27.32 kg/m  Physical Exam  Vitals and nursing note reviewed.  Constitutional:      General: She is not in acute distress.    Appearance: Normal appearance. She is well-developed. She is not ill-appearing, toxic-appearing or diaphoretic.     Comments: Chronically ill-appearing female, elderly  HENT:     Head: Normocephalic and atraumatic.  Eyes:     Conjunctiva/sclera: Conjunctivae normal.  Cardiovascular:     Rate and Rhythm: Normal rate and regular rhythm.     Heart sounds: No murmur heard. Pulmonary:     Effort: Pulmonary effort is normal. No respiratory distress.     Breath sounds: Normal breath  sounds.  Abdominal:     Palpations: Abdomen is soft.     Tenderness: There is no abdominal tenderness.  Musculoskeletal:        General: No swelling.     Cervical back: Neck supple.  Skin:    General: Skin is warm and dry.     Capillary Refill: Capillary refill takes less than 2 seconds.  Neurological:     Mental Status: She is alert and oriented to person, place, and time. Mental status is at baseline.     Sensory: No sensory deficit.     Comments: Moving all extremities. Word finding difficulties, no slurred speech.  Psychiatric:        Mood and Affect: Mood normal.     ED Results / Procedures / Treatments   Labs (all labs ordered are listed, but only abnormal results are displayed) Labs Reviewed  CBC - Abnormal; Notable for the following components:      Result Value   RBC 3.38 (*)    Hemoglobin 10.1 (*)    HCT 32.2 (*)    All other components within normal limits  COMPREHENSIVE METABOLIC PANEL - Abnormal; Notable for the following components:   CO2 21 (*)    Glucose, Bld 166 (*)    Creatinine, Ser 1.32 (*)    Calcium 8.6 (*)    GFR, Estimated 38 (*)    All other components within normal limits  I-STAT CHEM 8, ED - Abnormal; Notable for the following components:   Creatinine, Ser 1.50 (*)    Glucose, Bld 165 (*)    Calcium, Ion 1.08 (*)    TCO2 21 (*)    Hemoglobin 10.5 (*)    HCT 31.0 (*)    All other components within normal limits  CBG MONITORING, ED - Abnormal; Notable for the following components:   Glucose-Capillary 159 (*)    All other components within normal limits  PROTIME-INR  APTT  DIFFERENTIAL  ETHANOL  CBC  BASIC METABOLIC PANEL  MAGNESIUM  PHOSPHORUS    EKG None  Radiology CT HEAD CODE STROKE WO CONTRAST  Result Date: 05/04/2022 CLINICAL DATA:  Code stroke. Neuro deficit, acute, stroke suspected. Right-sided facial droop. Right-sided weakness. EXAM: CT HEAD WITHOUT CONTRAST TECHNIQUE: Contiguous axial images were obtained from the base  of the skull through the vertex without intravenous contrast. RADIATION DOSE REDUCTION: This exam was performed according to the departmental dose-optimization program which includes automated exposure control, adjustment of the mA and/or kV according to patient size and/or use of iterative reconstruction technique. COMPARISON:  Brain MRI 03/10/2020.  Head CT 10/17/2014. FINDINGS: Brain: Mild-to-moderate generalized cerebral atrophy. Mild patchy and ill-defined hypoattenuation within the cerebral white matter, nonspecific but compatible chronic small vessel ischemic disease. There is no acute intracranial hemorrhage. No demarcated cortical infarct. No extra-axial fluid collection. No evidence of an intracranial mass. No midline shift.  Vascular: Hyperdense vessel.  Atherosclerotic calcifications. Skull: No fracture or aggressive osseous lesion. Sinuses/Orbits: No mass or acute finding within the imaged orbits. No significant paranasal sinus disease at the imaged levels. ASPECTS [facility] Stroke Program Early CT Score) - Ganglionic level infarction (caudate, lentiform nuclei, internal capsule, insula, M1-M3 cortex): 7 - Supraganglionic infarction (M4-M6 cortex): 3 Total score (0-10 with 10 being normal): 10 These results were communicated to Dr. Lorrin Goodell At 5:35 pmon 9/25/2023by text page via the [facility] messaging system. IMPRESSION: No evidence of acute intracranial abnormality. Mild chronic small vessel ischemic changes within the cerebral white matter. Mild-to-moderate generalized cerebral atrophy Electronically Signed   By: Kellie Simmering D.O.   On: 05/04/2022 17:36    Procedures Procedures    Medications Ordered in ED Medications  sodium chloride flush (NS) 0.9 % injection 3 mL (3 mLs Intravenous Not Given 05/04/22 2011)  enoxaparin (LOVENOX) injection 40 mg (has no administration in time range)  donepezil (ARICEPT) tablet 10 mg (has no administration in time range)  memantine (NAMENDA) tablet 10 mg (has  no administration in time range)  levothyroxine (SYNTHROID) tablet 150 mcg (has no administration in time range)  One-A-Day Womens 50+ TABS 1 tablet (has no administration in time range)  PreserVision AREDS 2 CAPS (has no administration in time range)  pantoprazole (PROTONIX) EC tablet 40 mg (has no administration in time range)  QUEtiapine (SEROQUEL) tablet 50 mg (has no administration in time range)  sertraline (ZOLOFT) tablet 100 mg (has no administration in time range)  Polyethyl Glyc-Propyl Glyc PF 0.4-0.3 % SOLN 1 drop (has no administration in time range)  LORazepam (ATIVAN) injection 0.5 mg (has no administration in time range)  0.9 %  sodium chloride infusion (has no administration in time range)  labetalol (NORMODYNE) injection 5 mg (has no administration in time range)    ED Course/ Medical Decision Making/ A&P                          Medical Decision Making Amount and/or Complexity of Data Reviewed Labs: ordered. Radiology: ordered. ECG/medicine tests: ordered and independent interpretation performed.  Risk OTC drugs. Prescription drug management. Decision regarding hospitalization.  Critical Care Total time providing critical care: 0 minutes   Medical Decision Making  This is a 88 y.o. female with  past medical history of dementia, CKD who presents to the Emergency Department for strokelike symptoms  This patient is Presenting for Evaluation as a code stroke, which does require a range of treatment options, and is a complaint that involves a high risk of morbidity and mortality.  Arrived in ED by:  EMS History obtained from: The patient  Limitations in history: none    At this time I am most concerned for ischemic stroke. Also considering hemorrhagic stroke, TIA, metabolic abnormality, polypharmacy, medication overdose. Plan for laboratory and imaging study work-up, neurology consultation   I interpreted the ECG. It reveals a sinus rhythm. The QTc, PR, and  QRS are appropriate. There are no signs of acute ischemia or of significant electrical abnormalities. The ECG does not show a STEMI. There are no ST depressions. There are no T wave inversions. There is no evidence of a High-Grade Conduction Block.   Laboratory work-up significant for:  -CBC reveals stable chronic anemia, no significant leukocytosis - CMP revealed creatinine at baseline 1.3 to, no significant metabolic abnormality - Coagulation panel unremarkable   Radiologic work-up was significant for: -CT head with no significant intracranial abnormality  Interventions and Interval History: -Patient hemodynamically stable during her time in the emergency department.  She did not develop significant additional neurologic abnormalities.  CT head reassuring as she does not have evidence of significant intracranial abnormality however there is clinical concern for ischemic stroke versus TIA given her right-sided deficits. -Poison control contacted about the patient's reported drug ingestion.  Patient not take toxic doses of any of her medications, no additional intervention required at this time.     Consults: Neurology Recommendations: Admit for further imaging studies   Additional documents reviewed: Clinic note    Disposition: Due to the patients current presenting symptoms, physical exam findings, and the workup stated above, it is thought that the etiology of the patients current presentation is TIA versus polypharmacy    ADMIT: Patient is thought to require admission for further imaging studies, observation. Patient will be admitted to hospitalist service. Please see in patient provider note for additional treatment plan details.      The plan for this patient was discussed with Dr. Laverta Baltimore, who voiced agreement and who oversaw evaluation and treatment of this patient.     Clinical Complexity  A medically appropriate history, review of systems, and physical exam was  performed.   I personally reviewed the lab and imaging studies discussed above.  MDM generated using voice dictation software and may contain dictation errors. Please contact me for any clarification or with any questions.             Final Clinical Impression(s) / ED Diagnoses Final diagnoses:  None    Rx / DC Orders ED Discharge Orders     None         Enriqueta Augusta, [place], MD 05/04/22 2026    Margette Fast, MD 05/05/22 1056

## 2022-05-04 NOTE — Code Documentation (Signed)
[name] is a 88 yr old female with a history of Lewy Body Dementia, HTN CKD and hyponatremia. She is on no thinners. She is arriving to [facility] on 05/04/2022 via EMS for evaluation of right sided weakness.   She was last known well at 1500 today, after which time her home health aide felt like pt became lethargic and weak on the right side. EMS was dispatched, and code stroke was activated.   Stroke team at bedside for pt arrival. Labs drawn, CBG obtained and Airway cleared by EDP. Pt to CT with Stroke team. NIHSS 5. (Please see documentation for times and details on NIHSS). Pt with possible rt facial droop and bilateral leg weakness on exam.   CTNC completed. CT negative for acute hemorrhage per Dr Lorrin Goodell. Pt is not a candidate for thrombolytic at this time as too mild to treat. She is not eligible for Mechanical Thrombectomy due to clinical exam LVO negative.   Pt returned to ED room 22 where her workup will continue. She will need q 30 min VS and NIHSS until OOW for TNK at [place]. Bedside handoff with Vonna Kotyk RN complete.

## 2022-05-05 ENCOUNTER — Observation Stay (HOSPITAL_COMMUNITY): Payer: Medicare Other

## 2022-05-05 DIAGNOSIS — Z8673 Personal history of transient ischemic attack (TIA), and cerebral infarction without residual deficits: Secondary | ICD-10-CM | POA: Diagnosis not present

## 2022-05-05 DIAGNOSIS — I672 Cerebral atherosclerosis: Secondary | ICD-10-CM | POA: Diagnosis not present

## 2022-05-05 DIAGNOSIS — I6523 Occlusion and stenosis of bilateral carotid arteries: Secondary | ICD-10-CM | POA: Diagnosis not present

## 2022-05-05 DIAGNOSIS — R531 Weakness: Secondary | ICD-10-CM

## 2022-05-05 LAB — CBC
HCT: 29.1 % — ABNORMAL LOW (ref 36.0–46.0)
Hemoglobin: 9.7 g/dL — ABNORMAL LOW (ref 12.0–15.0)
MCH: 31 pg (ref 26.0–34.0)
MCHC: 33.3 g/dL (ref 30.0–36.0)
MCV: 93 fL (ref 80.0–100.0)
Platelets: 241 10*3/uL (ref 150–400)
RBC: 3.13 MIL/uL — ABNORMAL LOW (ref 3.87–5.11)
RDW: 14.2 % (ref 11.5–15.5)
WBC: 6.6 10*3/uL (ref 4.0–10.5)
nRBC: 0 % (ref 0.0–0.2)

## 2022-05-05 LAB — BASIC METABOLIC PANEL
Anion gap: 7 (ref 5–15)
BUN: 17 mg/dL (ref 8–23)
CO2: 24 mmol/L (ref 22–32)
Calcium: 8.9 mg/dL (ref 8.9–10.3)
Chloride: 106 mmol/L (ref 98–111)
Creatinine, Ser: 1.28 mg/dL — ABNORMAL HIGH (ref 0.44–1.00)
GFR, Estimated: 40 mL/min — ABNORMAL LOW (ref >60)
Glucose, Bld: 96 mg/dL (ref 70–99)
Potassium: 3.9 mmol/L (ref 3.5–5.1)
Sodium: 137 mmol/L (ref 135–145)

## 2022-05-05 LAB — HEMOGLOBIN A1C
Hgb A1c MFr Bld: 5.3 % (ref 4.8–5.6)
Mean Plasma Glucose: 105.41 mg/dL

## 2022-05-05 LAB — MAGNESIUM: Magnesium: 2.1 mg/dL (ref 1.7–2.4)

## 2022-05-05 LAB — PHOSPHORUS: Phosphorus: 4 mg/dL (ref 2.5–4.6)

## 2022-05-05 NOTE — Discharge Summary (Addendum)
Physician Discharge Summary  [name] PQZ:[id number] DOB: [birth year] DOA: 05/04/2022  PCP: Dorothyann Peng, NP  Admit date: 05/04/2022 Discharge date: 05/05/2022    Admitted From: Home Disposition: Home  Recommendations for Outpatient Follow-up:  Follow up with PCP in 1-2 weeks Please obtain BMP/CBC in one week Please follow up with your PCP on the following pending results: Unresulted Labs (From admission, onward)    None         Home Health: Yes Equipment/Devices: None  Discharge Condition: Stable CODE STATUS: Full code Diet recommendation: cardiac  Subjective: Patient seen and examined.  Son at the bedside.  Patient has no complaints.  She is back at her baseline.  She does not have any weakness or focal deficit on exam.  HPI: [name] is a 88 y.o. female with medical history significant for Lewy body dementia, hypertension, CKD 3B, who presented to [facility] ED as a code stroke due to acute onset of right sided weakness noted by home health aide at her home.  Last known well at 1600 today.  Code stroke was activated by EMS.     Per EMS encounter, there was concern for right mouth droop, right eyelid droop and possibly weakness of the right lower extremity on assisted ambulation.  The patient was brought into the ED and evaluated by neurology/stroke team.  CT head was nonacute.  Per her son at bedside the patient accidentally took 2 days worth of her dementia medications, memantine and Seroquel.  Upon assessment, neurology recommended MRI brain when able to obtain, plan to expand stroke work-up if MRI brain has acute findings.  The patient was admitted by [facility], hospitalist service.   ED Course: Tmax 97.8.  BP 160/57, pulse 59, respiration rate 22, O2 saturation 98% on room air.  Lab studies remarkable for glucose 165, creatinine 1.50, GFR 38, with baseline creatinine of 1.3.  Hemoglobin 10.5.   Review of Systems:  Brief/Interim Summary: Briefly, patient was presented to the  hospital with right-sided weakness however she did not have that weakness on the examination.  CT head was unremarkable.  She could not tolerate MRI.  Neurology was consulted.  Repeat CT head was done which was unremarkable.  On examination by neurology and by myself, patient does not have any focal deficit, she is just globally weak.  Per neurology, her right facial droop is likely not a droop but that is her baseline.  They signed off and cleared her for discharge.  She was assessed by PT OT who recommended SNF however patient and the family both declined SNF.  Family is interested in assisted living facility.  TOC is consulted.  Patient's blood pressure is elevated only because her antihypertensives were held.  Patient will be discharged home today unless [facility] request keeping her and if they have any expectation of her going to assisted living facility directly.  I have discussed all of this in detail with the son and he is in agreement.  Addendum: Received a message at 2 PM by the nurse that the son is now concerned that patient has slurred speech.  According to nurse however, patient on her exam is fluent and fully alert and oriented.  I went to see the patient at bedside.  On my examination to, patient was fully alert and oriented x2 at her baseline due to dementia and did not have any slurred speech.  The son wanted [place] to do the MRI.  We discussed that patient had already declined the  MRI.  He asked the patient again about MRI and she declined once again in front of me.  The son is very upset about that.  We explained to him that unfortunately we cannot force the patient to do something that they are declining.  And that patient has no focal deficit and has fluent to speech as well.  We discussed about discharge plan.  He wants her to go to assisted living facility directly from the hospital.  I asked him to further TOC/social worker to talk to him so they can help out.  Discharge plan was discussed with  patient and/or family member and they verbalized understanding and agreed with it.  Discharge Diagnoses:  Principal Problem:   Right sided weakness    Discharge Instructions   Allergies as of 05/05/2022       Reactions   Oxycodone Hcl Itching        Medication List     TAKE these medications    amLODipine 10 MG tablet Commonly known as: NORVASC TAKE 1 TABLET BY MOUTH  DAILY What changed: when to take this   diphenhydramine-acetaminophen 25-500 MG Tabs tablet Commonly known as: TYLENOL PM Take 1 tablet by mouth at bedtime as needed (for pain/sleep).   donepezil 10 MG tablet Commonly known as: ARICEPT Take 1 tablet daily What changed:  how much to take how to take this when to take this   levothyroxine 150 MCG tablet Commonly known as: SYNTHROID TAKE 1 TABLET BY MOUTH  DAILY What changed: when to take this   memantine 10 MG tablet Commonly known as: NAMENDA Take 1 tablet every night for 2 weeks, then increase to 1 tablet twice a day What changed:  how much to take how to take this when to take this additional instructions   olmesartan 20 MG tablet Commonly known as: BENICAR TAKE 1 TABLET BY MOUTH  DAILY What changed: when to take this   omeprazole 40 MG capsule Commonly known as: PRILOSEC TAKE 1 CAPSULE BY MOUTH  DAILY What changed: when to take this   PRESERVISION AREDS 2 PO Take 1 capsule by mouth daily with breakfast.   One-A-Day Womens 50+ Tabs Take 1 tablet by mouth daily with breakfast.   QUEtiapine 50 MG tablet Commonly known as: SEROquel Take 1 tablet twice a day What changed:  how much to take how to take this when to take this additional instructions   sertraline 100 MG tablet Commonly known as: Zoloft Take 1 tablet (100 mg total) by mouth daily.   Systane Ultra PF 0.4-0.3 % Soln Generic drug: Polyethyl Glyc-Propyl Glyc PF Place 1 drop into both eyes 3 (three) times daily as needed (for dryness).   TYLENOL 500 MG  tablet Generic drug: acetaminophen Take 500 mg by mouth every 6 (six) hours as needed for mild pain or headache.        Follow-up Information     Nafziger, [name], NP Follow up in 1 week(s).   Specialty: Family Medicine Contact information: [address] [place] [address] [phone]                Allergies  Allergen Reactions   Oxycodone Hcl Itching    Consultations: neurology   Procedures/Studies: CT HEAD WO CONTRAST (5MM)  Result Date: 05/05/2022 CLINICAL DATA:  Follow-up stroke EXAM: CT HEAD WITHOUT CONTRAST TECHNIQUE: Contiguous axial images were obtained from the base of the skull through the vertex without intravenous contrast. RADIATION DOSE REDUCTION: This exam was performed  according to the departmental dose-optimization program which includes automated exposure control, adjustment of the mA and/or kV according to patient size and/or use of iterative reconstruction technique. COMPARISON:  CT head 1 day prior, brain MRI 03/10/2020 FINDINGS: Brain: There is no acute intracranial hemorrhage, extra-axial fluid collection, or acute infarct. Parenchymal volume is stable. The ventricles are stable in size. Gray-white differentiation is preserved. Patchy hypodensity in the supratentorial white matter likely reflects sequela of mild chronic white matter microangiopathy. There is no mass lesion.  There is no mass effect or midline shift. Vascular: There is calcification of the bilateral carotid siphons and vertebral arteries. Skull: Normal. Negative for fracture or focal lesion. Sinuses/Orbits: The paranasal sinuses are clear. Bilateral lens implants are in place. The globes and orbits are otherwise unremarkable. Other: None. IMPRESSION: Stable noncontrast head CT with no acute intracranial pathology. Electronically Signed   By: Valetta Mole M.D.   On: 05/05/2022 08:57   CT HEAD CODE STROKE WO CONTRAST  Result Date: 05/04/2022 CLINICAL DATA:  Code stroke. Neuro  deficit, acute, stroke suspected. Right-sided facial droop. Right-sided weakness. EXAM: CT HEAD WITHOUT CONTRAST TECHNIQUE: Contiguous axial images were obtained from the base of the skull through the vertex without intravenous contrast. RADIATION DOSE REDUCTION: This exam was performed according to the departmental dose-optimization program which includes automated exposure control, adjustment of the mA and/or kV according to patient size and/or use of iterative reconstruction technique. COMPARISON:  Brain MRI 03/10/2020.  Head CT 10/17/2014. FINDINGS: Brain: Mild-to-moderate generalized cerebral atrophy. Mild patchy and ill-defined hypoattenuation within the cerebral white matter, nonspecific but compatible chronic small vessel ischemic disease. There is no acute intracranial hemorrhage. No demarcated cortical infarct. No extra-axial fluid collection. No evidence of an intracranial mass. No midline shift. Vascular: Hyperdense vessel.  Atherosclerotic calcifications. Skull: No fracture or aggressive osseous lesion. Sinuses/Orbits: No mass or acute finding within the imaged orbits. No significant paranasal sinus disease at the imaged levels. ASPECTS [facility] Stroke Program Early CT Score) - Ganglionic level infarction (caudate, lentiform nuclei, internal capsule, insula, M1-M3 cortex): 7 - Supraganglionic infarction (M4-M6 cortex): 3 Total score (0-10 with 10 being normal): 10 These results were communicated to Dr. Lorrin Goodell At 5:35 pmon 9/25/2023by text page via the [facility] messaging system. IMPRESSION: No evidence of acute intracranial abnormality. Mild chronic small vessel ischemic changes within the cerebral white matter. Mild-to-moderate generalized cerebral atrophy Electronically Signed   By: Kellie Simmering D.O.   On: 05/04/2022 17:36     Discharge Exam: Vitals:   05/05/22 1030 05/05/22 1100  BP: (!) 171/52 (!) 162/48  Pulse: 62 65  Resp: 14 20  Temp: 97.8 F (36.6 C)   SpO2: 96% 97%   Vitals:    05/05/22 0500 05/05/22 1000 05/05/22 1030 05/05/22 1100  BP: (!) 151/46 (!) 171/52 (!) 171/52 (!) 162/48  Pulse: (!) 51  62 65  Resp: '14 12 14 20  '$ Temp:   97.8 F (36.6 C)   TempSrc:   Oral   SpO2: 97%  96% 97%  Weight:      Height:        General: Pt is alert, awake, not in acute distress Cardiovascular: RRR, S1/S2 +, no rubs, no gallops Respiratory: CTA bilaterally, no wheezing, no rhonchi Abdominal: Soft, NT, ND, bowel sounds + Extremities: no edema, no cyanosis    The results of significant diagnostics from this hospitalization (including imaging, microbiology, ancillary and laboratory) are listed below for reference.     Microbiology: No results found for this  or any previous visit (from the past 240 hour(s)).   Labs: BNP (last 3 results) No results for input(s): "BNP" in the last 8760 hours. Basic Metabolic Panel: Recent Labs  Lab 05/04/22 1715 05/04/22 1719 05/05/22 0353  NA 136 136 137  K 3.9 3.9 3.9  CL 106 105 106  CO2 21*  --  24  GLUCOSE 166* 165* 96  BUN '20 22 17  '$ CREATININE 1.32* 1.50* 1.28*  CALCIUM 8.6*  --  8.9  MG  --   --  2.1  PHOS  --   --  4.0   Liver Function Tests: Recent Labs  Lab 05/04/22 1715  AST 17  ALT 16  ALKPHOS 90  BILITOT 0.4  PROT 6.5  ALBUMIN 3.7   No results for input(s): "LIPASE", "AMYLASE" in the last 168 hours. No results for input(s): "AMMONIA" in the last 168 hours. CBC: Recent Labs  Lab 05/04/22 1715 05/04/22 1719 05/05/22 0353  WBC 7.5  --  6.6  NEUTROABS 4.8  --   --   HGB 10.1* 10.5* 9.7*  HCT 32.2* 31.0* 29.1*  MCV 95.3  --  93.0  PLT 256  --  241   Cardiac Enzymes: No results for input(s): "CKTOTAL", "CKMB", "CKMBINDEX", "TROPONINI" in the last 168 hours. BNP: Invalid input(s): "POCBNP" CBG: Recent Labs  Lab 05/04/22 1714  GLUCAP 159*   D-Dimer No results for input(s): "DDIMER" in the last 72 hours. Hgb A1c Recent Labs    05/05/22 0353  HGBA1C 5.3   Lipid Profile No results for  input(s): "CHOL", "HDL", "LDLCALC", "TRIG", "CHOLHDL", "LDLDIRECT" in the last 72 hours. Thyroid function studies No results for input(s): "TSH", "T4TOTAL", "T3FREE", "THYROIDAB" in the last 72 hours.  Invalid input(s): "FREET3" Anemia work up No results for input(s): "VITAMINB12", "FOLATE", "FERRITIN", "TIBC", "IRON", "RETICCTPCT" in the last 72 hours. Urinalysis    Component Value Date/Time   COLORURINE YELLOW 07/08/2021 1146   APPEARANCEUR CLEAR 07/08/2021 1146   LABSPEC 1.015 07/08/2021 1146   PHURINE 6.0 07/08/2021 1146   GLUCOSEU NEGATIVE 07/08/2021 1146   HGBUR NEGATIVE 07/08/2021 [place] 07/08/2021 1146   BILIRUBINUR Negative 01/28/2018 [place] 07/08/2021 1146   PROTEINUR Negative 01/28/2018 1213   PROTEINUR NEGATIVE 11/23/2014 1137   UROBILINOGEN 0.2 07/08/2021 1146   NITRITE NEGATIVE 07/08/2021 [place] 07/08/2021 1146   Sepsis Labs Recent Labs  Lab 05/04/22 1715 05/05/22 0353  WBC 7.5 6.6   Microbiology No results found for this or any previous visit (from the past 240 hour(s)).   Time coordinating discharge: Over 30 minutes  SIGNED:   Darliss Cheney, MD  [facility] 05/05/2022, 1:55 PM *Please note that this is a verbal dictation therefore any spelling or grammatical errors are due to the "[place] [employer]" system interpretation. If 7PM-7AM, please contact night-coverage [email or web address]

## 2022-05-05 NOTE — ED Notes (Signed)
TOC down to speak with patient and her son.

## 2022-05-05 NOTE — ED Notes (Signed)
Ambulated to restroom with 1to1 assistance.

## 2022-05-05 NOTE — ED Notes (Signed)
Patient ambulatory to the restroom with minimal assistance

## 2022-05-05 NOTE — Progress Notes (Addendum)
NEUROLOGY CONSULTATION PROGRESS NOTE   Date of service: May 05, 2022 Patient Name: [name] MRN:  [id number] DOB:  15-Jul-1929    Interval Hx   Awake, alert, oriented to month, year and age. No vision deficiet after she puts on her glasses and able to identify objects.  Declined to get MRI Brain.  Vitals   Vitals:   05/05/22 0200 05/05/22 0300 05/05/22 0400 05/05/22 0500  BP: (!) 154/49 (!) 154/48 (!) 187/55 (!) 151/46  Pulse: (!) 51 (!) 51 (!) 55 (!) 51  Resp: '20 19 18 14  '$ Temp:   97.7 F (36.5 C)   TempSrc:   Oral   SpO2: 95% 94% 97% 97%  Weight:      Height:         Body mass index is 27.32 kg/m.  Physical Exam   General: Laying comfortably in bed; in no acute distress.  HENT: Normal oropharynx and mucosa. Normal external appearance of ears and nose.  Neck: Supple, no pain or tenderness  CV: No JVD. No peripheral edema.  Pulmonary: Symmetric Chest rise. Normal respiratory effort.  Abdomen: Soft to touch, non-tender.  Ext: No cyanosis, edema, or deformity  Skin: No rash. Normal palpation of skin.   Musculoskeletal: Normal digits and nails by inspection. No clubbing.   Neurologic Examination  Mental status/Cognition: Alert, oriented to self, place, month and year, good attention.  Speech/language: Fluent, comprehension intact, object naming intact, repetition intact.  Cranial nerves:   CN II Pupils equal and reactive to light, no VF deficits    CN III,IV,VI EOM intact, no gaze preference or deviation, no nystagmus    CN V normal sensation in V1, V2, and V3 segments bilaterally    CN VII no asymmetry, no nasolabial fold flattening    CN VIII normal hearing to speech    CN IX & X normal palatal elevation, no uvular deviation    CN XI 5/5 head turn and 5/5 shoulder shrug bilaterally    CN XII midline tongue protrusion   Motor:  Muscle bulk:normal, tone normal Mvmt Root Nerve  Muscle Right Left Comments  SA C5/6 Ax Deltoid     EF C5/6 Mc Biceps 5 5    EE C6/7/8 Rad Triceps 5 5   WF C6/7 Med FCR     WE C7/8 PIN ECU     F Ab C8/T1 U ADM/FDI 5 5   HF L1/2/3 Fem Illopsoas 5 5   KE L2/3/4 Fem Quad     DF L4/5 D Peron Tib Ant     PF S1/2 Tibial Grc/Sol      Sensation:  Light touch Intact throughout   Pin prick    Temperature    Vibration   Proprioception    Coordination/Complex Motor:  - Finger to Nose intact BL  Labs   Basic Metabolic Panel:  Lab Results  Component Value Date   NA 137 05/05/2022   K 3.9 05/05/2022   CO2 24 05/05/2022   GLUCOSE 96 05/05/2022   BUN 17 05/05/2022   CREATININE 1.28 (H) 05/05/2022   CALCIUM 8.9 05/05/2022   GFRNONAA 40 (L) 05/05/2022   GFRAA 43 (L) 11/23/2014   HbA1c:  Lab Results  Component Value Date   HGBA1C 5.3 05/05/2022   LDL:  Lab Results  Component Value Date   LDLCALC 110 (H) 03/31/2019   Urine Drug Screen: No results found for: "LABOPIA", "COCAINSCRNUR", "LABBENZ", "AMPHETMU", "THCU", "LABBARB"  Alcohol Level     Component Value  Date/Time   ETH <10 05/04/2022 1715   No results found for: "PHENYTOIN", "ZONISAMIDE", "LAMOTRIGINE", "LEVETIRACETA" No results found for: "PHENYTOIN", "PHENOBARB", "VALPROATE", "CBMZ"  Imaging and Diagnostic studies  Results for orders placed during the hospital encounter of 05/04/22 CT Head without contrast. IMPRESSION: No evidence of acute intracranial abnormality.  Mild chronic small vessel ischemic changes within the cerebral white matter.  Mild-to-moderate generalized cerebral atrophy    Impression   [name] is a 88 y.o. female with PMHx of Lewy body dementia, HTN, chronic renal insufficiency stage III, depression, and hypothyroidism presents for evaluation of drowsiness with AMS following accidental ingestion of a second dose of daily medications. Some concern for a potential R facial droop v fold. I think that this is likely a fold rather than a facial droop.  Recommendations  - Repeat CTH. No further workup if negative.  Etiology of her encephalopathy was likely toxic metabolic in the setting of polypharmacy and accidentally taking 2 days of meds all at once.   Update: 11:43 AM Repeat CTH reviewed and is negative for any acute intracranial abnormalities. No further inpatient neurological workup needed at this time. We will signoff. ______________________________________________________________________   Thank you for the opportunity to take part in the care of this patient. If you have any further questions, please contact the neurology consultation attending.  Signed,  [place] Pager Number [phone]

## 2022-05-05 NOTE — ED Notes (Signed)
Patient given discharge instructions, all questions answered. Patient in possession of all belongings, directed to the discharge area  

## 2022-05-05 NOTE — Progress Notes (Addendum)
TOC CSW spoke with pts son, Shenelle Klas at bedside.  CSW provided [name] with Assisted Living resources.  HH PT, RN, and SW has been set up with [facility] in [place].  Zohra Clavel Tarpley-Carter, MSW, LCSW-A Pronouns:  She/Her/Hers [facility] HealthTransitions of Care Clinical Social Worker Direct Number:  [phone] [email or web address]

## 2022-05-05 NOTE — Evaluation (Signed)
Clinical/Bedside Swallow Evaluation Patient Details  Name: [name] MRN: [id number] Date of Birth: 1929/05/20  Today's Date: 05/05/2022 Time: SLP Start Time (ACUTE ONLY): 64 SLP Stop Time (ACUTE ONLY): 0930 SLP Time Calculation (min) (ACUTE ONLY): 15 min  Past Medical History:  Past Medical History:  Diagnosis Date   Anemia    Chronic fatigue    Chronic renal insufficiency, stage III (moderate) ([place])    Depression    HYPERTENSION 04/04/2007   HYPOTHYROIDISM 04/04/2007   Lewy body dementia ([place])    Macular degeneration    Past Surgical History:  Past Surgical History:  Procedure Laterality Date   ABDOMINAL HYSTERECTOMY     APPENDECTOMY     BACK SURGERY  2012   Cataracts     CHOLECYSTECTOMY     ERCP N/A 11/22/2014   Procedure: ENDOSCOPIC RETROGRADE CHOLANGIOPANCREATOGRAPHY (ERCP);  Surgeon: Clarene Essex, MD;  Location: [facility] ENDOSCOPY;  Service: Endoscopy;  Laterality: N/A;   HERNIA REPAIR     OOPHORECTOMY     ORIF TIBIA FRACTURE  2012   dr Sharol Given   SPINE SURGERY  2011   cervical   HPI:  Patient is a 88 y.o. female with PMH: HTN, lewy body dementia, macular degeneration, anemia, depression. She presented to the hospital on 05/04/22 with acute right sided weakness and reportedly had accidentally taken an extra dose of her morning medications. She presented with drowsiness, AMS. CT head on 05/04/22 and repeat CT head on 05/05/22 both negative for acute intracranial abnormality. Neurology evaluated and suspecting patient's encephalopathy is the result of accidental polypharmacy.    Assessment / Plan / Recommendation  Clinical Impression  Patient currently not presenting with clinical s/s of aspiration as per this bedside/clinical swallow evaluation. Prior to PO's, she exhibited a mildlly hoarse, dry cough and per patient (confirmed by son in room) this is her baseline. Swallow initiation was timely with straw sips of thin liquids (water) and although she did exhibit a few more instances  of cough, these were inconsistent and did not appear related to PO intake. In addition, CT head negative for acute intracranial abnormality and patient has no h/o dysphagia. She tolerated eating graham crackers without significant delay in mastication or oral transit and swallow initiation was timely. SLP recommending initiate regular solids, thin liquids and no f/u needed at this time. SLP Visit Diagnosis: Dysphagia, unspecified (R13.10)    Aspiration Risk  No limitations;Mild aspiration risk    Diet Recommendation Regular;Thin liquid   Liquid Administration via: Cup;Straw Medication Administration: Whole meds with liquid Supervision: Patient able to self feed;Intermittent supervision to cue for compensatory strategies Compensations: Slow rate;Small sips/bites;Minimize environmental distractions Postural Changes: Seated upright at 90 degrees    Other  Recommendations Oral Care Recommendations: Oral care BID    Recommendations for follow up therapy are one component of a multi-disciplinary discharge planning process, led by the attending physician.  Recommendations may be updated based on patient status, additional functional criteria and insurance authorization.  Follow up Recommendations No SLP follow up      Assistance Recommended at Discharge Intermittent Supervision/Assistance  Functional Status Assessment Patient has had a recent decline in their functional status and demonstrates the ability to make significant improvements in function in a reasonable and predictable amount of time.  Frequency and Duration   N/A         Prognosis   N/A     Swallow Study   General Date of Onset: 05/04/22 HPI: Patient is a 88 y.o. female with  PMH: HTN, lewy body dementia, macular degeneration, anemia, depression. She presented to the hospital on 05/04/22 with acute right sided weakness and reportedly had accidentally taken an extra dose of her morning medications. She presented with drowsiness,  AMS. CT head on 05/04/22 and repeat CT head on 05/05/22 both negative for acute intracranial abnormality. Neurology evaluated and suspecting patient's encephalopathy is the result of accidental polypharmacy. Type of Study: Bedside Swallow Evaluation Previous Swallow Assessment: none found Diet Prior to this Study: NPO Temperature Spikes Noted: No Respiratory Status: Room air History of Recent Intubation: No Behavior/Cognition: Alert;Cooperative;Pleasant mood Oral Cavity Assessment: Within Functional Limits Oral Care Completed by SLP: No Oral Cavity - Dentition: Adequate natural dentition Vision: Functional for self-feeding Self-Feeding Abilities: Able to feed self;Needs assist Patient Positioning: Upright in bed Baseline Vocal Quality: Normal Volitional Cough: Strong Volitional Swallow: Able to elicit    Oral/Motor/Sensory Function Overall Oral Motor/Sensory Function: Mild impairment Facial ROM: Reduced right Facial Symmetry: Abnormal symmetry right Lingual ROM: Within Functional Limits Lingual Symmetry: Within Functional Limits   Ice Chips     Thin Liquid Thin Liquid: Within functional limits Presentation: Straw Other Comments: patient with dry, hoarse sounding cough prior to any PO's and this did occur during PO intake. Per patient and confirmed by son, this is baseline for her    Nectar Thick     Honey Thick     Puree Puree: Not tested   Solid     Solid: Within functional limits      Sonia Baller, MA, CCC-SLP Speech Therapy

## 2022-05-05 NOTE — Evaluation (Signed)
Occupational Therapy Evaluation Patient Details Name: [name] MRN: [id number] DOB: 06/14/1929 Today's Date: 05/05/2022   History of Present Illness Pt is a 88 y/o female who presented with acute R sided weakness. Pt had also taken an extra dose of morning medication. CT head negative, pt refused MRI. PMH: anemia, depression, HTN, lewy body dementia, macular degneration.   Clinical Impression   PTA, pt lives alone, has a family friend that checks in 3x/wk. Pt reports typically Modified Independent with ADLs/mobility using Rollator, and can manage basic household IADLs. Per son, pt's last fall was 3 months ago when she was out in the yard. Due to dementia, pt has been having difficulty remembering to take medications and/or taking too much meds. Pt presents now with deficits in standing balance, endurance, baseline cognitive deficits and overall strength. Pt currently reliant on UE support in standing with noted posterior LOB standing at sink, requiring Min A to correct. Pt requires Min A for mobility using RW with assist to manage DME safely. Pt requires Supervision for UB ADL and Min A for LB ADLs. As pt is at increased risk for falls, does not have consistent support for daily tasks (med mgmt), recommend SNF rehab. If pt continues to refuse SNF rehab, rec HHOT follow up. Discussed long term recommendation of ALF with pt's son who has been looking into this.     Recommendations for follow up therapy are one component of a multi-disciplinary discharge planning process, led by the attending physician.  Recommendations may be updated based on patient status, additional functional criteria and insurance authorization.   Follow Up Recommendations  Skilled nursing-short term rehab (<3 hours/day) (if pt continues to refuse SNF, rec HHOT)    Assistance Recommended at Discharge Frequent or constant Supervision/Assistance  Patient can return home with the following A little help with  bathing/dressing/bathroom;Assistance with cooking/housework;Direct supervision/assist for medications management;Direct supervision/assist for financial management    Functional Status Assessment  Patient has had a recent decline in their functional status and demonstrates the ability to make significant improvements in function in a reasonable and predictable amount of time.  Equipment Recommendations  BSC/3in1    Recommendations for Other Services       Precautions / Restrictions Precautions Precautions: Fall Restrictions Weight Bearing Restrictions: No      Mobility Bed Mobility Overal bed mobility: Modified Independent                  Transfers Overall transfer level: Needs assistance Equipment used: Rolling walker (2 wheels) Transfers: Sit to/from Stand Sit to Stand: Min assist           General transfer comment: Assist to stabilize RW as pt pulled on DME to stand.      Balance Overall balance assessment: Needs assistance Sitting-balance support: No upper extremity supported, Feet supported Sitting balance-Leahy Scale: Good     Standing balance support: No upper extremity supported, During functional activity, Bilateral upper extremity supported Standing balance-Leahy Scale: Poor Standing balance comment: reliant on UE support, one posterior LOB standing at sink unsupported with Min A to correct                           ADL either performed or assessed with clinical judgement   ADL Overall ADL's : Needs assistance/impaired Eating/Feeding: Independent   Grooming: Min guard;Standing;Wash/dry hands Grooming Details (indicate cue type and reason): minor cues to locate items, work automatic sink. one posterior LOB standing unsupported  with Min A to correct Upper Body Bathing: Supervision/ safety;Sitting   Lower Body Bathing: Minimal assistance;Sit to/from stand   Upper Body Dressing : Supervision/safety;Sitting   Lower Body Dressing:  Minimal assistance;Sit to/from stand   Toilet Transfer: Minimal assistance;Ambulation;Rolling walker (2 wheels);Regular Glass blower/designer Details (indicate cue type and reason): assist to manuever RW, did push RW to the side to step to toilet with unsteadiness noted Toileting- Water quality scientist and Hygiene: Min guard;Sit to/from stand Toileting - Clothing Manipulation Details (indicate cue type and reason): close guarding for clothing mgmt standing at toilet, able to pull up clothing with increased time     Functional mobility during ADLs: Minimal assistance;Rolling walker (2 wheels);Cueing for safety;Cueing for sequencing General ADL Comments: Pt with unsteadiness without UE support though difficulty manuevering RW. pt is at increased risk for falls, further complicated by cognitive deficits. Discussed if med mgmt assist available consistently though does not appear so at this time.     Vision Baseline Vision/History: 1 Wears glasses Ability to See in Adequate Light: 1 Impaired Patient Visual Report: No change from baseline Vision Assessment?: No apparent visual deficits     Perception     Praxis      Pertinent Vitals/Pain Pain Assessment Pain Assessment: No/denies pain     Hand Dominance Right   Extremity/Trunk Assessment Upper Extremity Assessment Upper Extremity Assessment: Generalized weakness   Lower Extremity Assessment Lower Extremity Assessment: Defer to PT evaluation   Cervical / Trunk Assessment Cervical / Trunk Assessment: Normal   Communication Communication Communication: No difficulties   Cognition Arousal/Alertness: Awake/alert Behavior During Therapy: WFL for tasks assessed/performed Overall Cognitive Status: History of cognitive impairments - at baseline                                 General Comments: hx of dementia, follows one step directions consistently, poor safety awareness and DME use     General Comments  HR 28s. son  present, concerned about pt wellbeing at home. reports he has been looking into ALF though difficulty in obtaining placement    Exercises     Shoulder Instructions      Home Living Family/patient expects to be discharged to:: Private residence Living Arrangements: Alone Available Help at Discharge: Family;Personal care attendant;Available PRN/intermittently Type of Home: House Home Access: Level entry     Home Layout: One level     Bathroom Shower/Tub: Teacher, early years/pre: Standard     Home Equipment: Rollator (4 wheels);BSC/3in1          Prior Functioning/Environment Prior Level of Function : Needs assist             Mobility Comments: uses Rollator for mobility but per son, she does not use brakes appropriately. last fall was 3 months ago when pt was out in the flower beds (son has asked pt to avoid being out in the yard). another previous fall involved taking too much of her medication ADLs Comments: Typically MOD I for ADLs, can make simple meals in the kitchen. Does not drive. Son pays a family friend to check in 3x/wk. pt has a pill box that she uses but often forgets to take medication or has had a hx of taking too much        OT Problem List: Decreased strength;Decreased activity tolerance;Impaired balance (sitting and/or standing);Decreased cognition;Decreased safety awareness;Decreased knowledge of use of DME or AE  OT Treatment/Interventions: Self-care/ADL training;Therapeutic exercise;Energy conservation;DME and/or AE instruction;Therapeutic activities;Patient/family education    OT Goals(Current goals can be found in the care plan section) Acute Rehab OT Goals Patient Stated Goal: wants to eat breakfast OT Goal Formulation: With patient/family Time For Goal Achievement: 05/19/22 Potential to Achieve Goals: Good  OT Frequency: Min 2X/week    Co-evaluation              AM-PAC OT "6 Clicks" Daily Activity     Outcome Measure  Help from another person eating meals?: None Help from another person taking care of personal grooming?: A Little Help from another person toileting, which includes using toliet, bedpan, or urinal?: A Little Help from another person bathing (including washing, rinsing, drying)?: A Little Help from another person to put on and taking off regular upper body clothing?: A Little Help from another person to put on and taking off regular lower body clothing?: A Little 6 Click Score: 19   End of Session Equipment Utilized During Treatment: Rolling walker (2 wheels);Gait belt Nurse Communication: Mobility status  Activity Tolerance: Patient tolerated treatment well Patient left: in bed;with call bell/phone within reach;with family/visitor present  OT Visit Diagnosis: Unsteadiness on feet (R26.81);Other abnormalities of gait and mobility (R26.89)                Time: 6067-7034 OT Time Calculation (min): 32 min Charges:  OT General Charges $OT Visit: 1 Visit OT Evaluation $OT Eval Low Complexity: 1 Low OT Treatments $Self Care/Home Management : 8-22 mins  Malachy Chamber, OTR/L Acute Rehab Services Office: [phone]   Layla Maw 05/05/2022, 10:27 AM

## 2022-05-05 NOTE — Progress Notes (Addendum)
PT note Evaluation completed. Full note to follow. Pt has rollator at home and is most likely close to baseline. She was unsteady with gait but states that she is unsteady at baseline. Wants to go home with HHPT and will likely refuse a short term SNF,although pt would benefit from short term rehab to improve balance. Will follow acutely.  Anh Mangano M,PT Acute Rehab Services [phone]

## 2022-05-05 NOTE — Evaluation (Signed)
Physical Therapy Evaluation Patient Details Name: [name] MRN: [id number] DOB: 09-06-1928 Today's Date: 05/05/2022  History of Present Illness  Pt is a 88 y/o female who presented with acute R sided weakness. Pt had also taken an extra dose of morning medication. CT head negative, pt refused MRI. PMH: anemia, depression, HTN, lewy body dementia, macular degneration.  Clinical Impression  Pt admitted with above diagnosis. Pt was able to ambulate on unit with several LOB without use of device.  Pt would benefit from short term SNF for balance training and OT met son and son has concerns about pt being at home alone.  They have been looking into ALF for pt.  Will follow acutely.  Pt currently with functional limitations due to the deficits listed below (see PT Problem List). Pt will benefit from skilled PT to increase their independence and safety with mobility to allow discharge to the venue listed below.          Recommendations for follow up therapy are one component of a multi-disciplinary discharge planning process, led by the attending physician.  Recommendations may be updated based on patient status, additional functional criteria and insurance authorization.  Follow Up Recommendations Skilled nursing-short term rehab (<3 hours/day) (Pt may refuse therefore will need HHPT f/u if he refuses) Can patient physically be transported by private vehicle: Yes    Assistance Recommended at Discharge Intermittent Supervision/Assistance  Patient can return home with the following  A little help with walking and/or transfers;A little help with bathing/dressing/bathroom;Assistance with cooking/housework;Assist for transportation;Help with stairs or ramp for entrance    Equipment Recommendations None recommended by PT  Recommendations for Other Services       Functional Status Assessment Patient has had a recent decline in their functional status and demonstrates the ability to make significant  improvements in function in a reasonable and predictable amount of time.     Precautions / Restrictions Precautions Precautions: Fall Restrictions Weight Bearing Restrictions: No      Mobility  Bed Mobility Overal bed mobility: Modified Independent                  Transfers Overall transfer level: Needs assistance Equipment used: Rolling walker (2 wheels) Transfers: Sit to/from Stand Sit to Stand: Min assist           General transfer comment: Assist needed as pt unsteady on her feet.    Ambulation/Gait Ambulation/Gait assistance: Min assist Gait Distance (Feet): 50 Feet Assistive device: 1 person hand held assist Gait Pattern/deviations: Step-through pattern, Decreased stride length, Staggering left, Staggering right, Wide base of support   Gait velocity interpretation: <1.31 ft/sec, indicative of household ambulator   General Gait Details: Pt slightly unsteady on her feet at times.  Pt staggering unless she had at least 1 UE support.  States she uses rollator at home at times.  Pt would benefit from use of device at home at all times.  Stairs            Wheelchair Mobility    Modified Rankin (Stroke Patients Only) Modified Rankin (Stroke Patients Only) Pre-Morbid Rankin Score: Slight disability Modified Rankin: Moderately severe disability     Balance Overall balance assessment: Needs assistance Sitting-balance support: No upper extremity supported, Feet supported Sitting balance-Leahy Scale: Good     Standing balance support: No upper extremity supported, During functional activity, Bilateral upper extremity supported Standing balance-Leahy Scale: Poor Standing balance comment: reliant on UE support, can stand staticallly with at least 1 UE  support but dynamically several LOB needing  Min A to correct                             Pertinent Vitals/Pain Pain Assessment Pain Assessment: No/denies pain    Home Living  Family/patient expects to be discharged to:: Private residence Living Arrangements: Alone Available Help at Discharge: Family;Personal care attendant;Available PRN/intermittently Type of Home: House Home Access: Level entry       Home Layout: One level Home Equipment: Rollator (4 wheels);BSC/3in1      Prior Function Prior Level of Function : Needs assist             Mobility Comments: uses Rollator for mobility but per son, she does not use brakes appropriately. last fall was 3 months ago when pt was out in the flower beds (son has asked pt to avoid being out in the yard). another previous fall involved taking too much of her medication ADLs Comments: Typically MOD I for ADLs, can make simple meals in the kitchen. Does not drive. Son pays a family friend to check in 3x/wk. pt has a pill box that she uses but often forgets to take medication or has had a hx of taking too much     Hand Dominance   Dominant Hand: Right    Extremity/Trunk Assessment   Upper Extremity Assessment Upper Extremity Assessment: Defer to OT evaluation    Lower Extremity Assessment Lower Extremity Assessment: Generalized weakness    Cervical / Trunk Assessment Cervical / Trunk Assessment: Normal  Communication   Communication: No difficulties  Cognition Arousal/Alertness: Awake/alert Behavior During Therapy: WFL for tasks assessed/performed Overall Cognitive Status: History of cognitive impairments - at baseline                                 General Comments: hx of dementia, follows one step directions consistently, poor safety awareness and DME use        General Comments General comments (skin integrity, edema, etc.): VSS    Exercises     Assessment/Plan    PT Assessment Patient needs continued PT services  PT Problem List Decreased activity tolerance;Decreased balance;Decreased mobility;Decreased knowledge of use of DME;Decreased safety awareness       PT  Treatment Interventions Gait training;DME instruction;Functional mobility training;Therapeutic activities;Therapeutic exercise;Balance training;Patient/family education    PT Goals (Current goals can be found in the Care Plan section)  Acute Rehab PT Goals Patient Stated Goal: to go home PT Goal Formulation: With patient Time For Goal Achievement: 05/19/22 Potential to Achieve Goals: Good    Frequency Min 4X/week     Co-evaluation               AM-PAC PT "6 Clicks" Mobility  Outcome Measure Help needed turning from your back to your side while in a flat bed without using bedrails?: A Little Help needed moving from lying on your back to sitting on the side of a flat bed without using bedrails?: A Little Help needed moving to and from a bed to a chair (including a wheelchair)?: A Little Help needed standing up from a chair using your arms (e.g., wheelchair or bedside chair)?: A Little Help needed to walk in hospital room?: A Lot Help needed climbing 3-5 steps with a railing? : A Lot 6 Click Score: 16    End of Session Equipment Utilized During Treatment: Gait  belt Activity Tolerance: Patient limited by fatigue Patient left: with call bell/phone within reach;in bed Nurse Communication: Mobility status PT Visit Diagnosis: Unsteadiness on feet (R26.81)    Time: 3601-6580 PT Time Calculation (min) (ACUTE ONLY): 23 min   Charges:   PT Evaluation $PT Eval Moderate Complexity: 1 Mod PT Treatments $Gait Training: 8-22 mins       [facility] M,PT Acute Rehab Services [phone]   Alvira Philips 05/05/2022, 12:38 PM

## 2022-05-07 DIAGNOSIS — F32A Depression, unspecified: Secondary | ICD-10-CM | POA: Diagnosis not present

## 2022-05-07 DIAGNOSIS — D631 Anemia in chronic kidney disease: Secondary | ICD-10-CM | POA: Diagnosis not present

## 2022-05-07 DIAGNOSIS — Z91130 Patient's unintentional underdosing of medication regimen due to age-related debility: Secondary | ICD-10-CM | POA: Diagnosis not present

## 2022-05-07 DIAGNOSIS — E1122 Type 2 diabetes mellitus with diabetic chronic kidney disease: Secondary | ICD-10-CM | POA: Diagnosis not present

## 2022-05-07 DIAGNOSIS — E039 Hypothyroidism, unspecified: Secondary | ICD-10-CM | POA: Diagnosis not present

## 2022-05-07 DIAGNOSIS — Z87891 Personal history of nicotine dependence: Secondary | ICD-10-CM | POA: Diagnosis not present

## 2022-05-07 DIAGNOSIS — R5382 Chronic fatigue, unspecified: Secondary | ICD-10-CM | POA: Diagnosis not present

## 2022-05-07 DIAGNOSIS — H3530 Unspecified macular degeneration: Secondary | ICD-10-CM | POA: Diagnosis not present

## 2022-05-07 DIAGNOSIS — Z9181 History of falling: Secondary | ICD-10-CM | POA: Diagnosis not present

## 2022-05-07 DIAGNOSIS — F0283 Dementia in other diseases classified elsewhere, unspecified severity, with mood disturbance: Secondary | ICD-10-CM | POA: Diagnosis not present

## 2022-05-07 DIAGNOSIS — I129 Hypertensive chronic kidney disease with stage 1 through stage 4 chronic kidney disease, or unspecified chronic kidney disease: Secondary | ICD-10-CM | POA: Diagnosis not present

## 2022-05-07 DIAGNOSIS — E1165 Type 2 diabetes mellitus with hyperglycemia: Secondary | ICD-10-CM | POA: Diagnosis not present

## 2022-05-07 DIAGNOSIS — G3183 Dementia with Lewy bodies: Secondary | ICD-10-CM | POA: Diagnosis not present

## 2022-05-07 DIAGNOSIS — N1832 Chronic kidney disease, stage 3b: Secondary | ICD-10-CM | POA: Diagnosis not present

## 2022-05-08 ENCOUNTER — Telehealth: Payer: Self-pay | Admitting: Adult Health

## 2022-05-08 NOTE — Telephone Encounter (Signed)
Approval for Home Health PT 1x8 and medical social worker consult for community resources, skilled nursing assissment for medication management

## 2022-05-12 ENCOUNTER — Other Ambulatory Visit: Payer: Self-pay

## 2022-05-12 ENCOUNTER — Ambulatory Visit (INDEPENDENT_AMBULATORY_CARE_PROVIDER_SITE_OTHER): Payer: Medicare Other | Admitting: Adult Health

## 2022-05-12 VITALS — BP 160/62 | HR 61 | Temp 97.8°F | Wt 147.4 lb

## 2022-05-12 DIAGNOSIS — I129 Hypertensive chronic kidney disease with stage 1 through stage 4 chronic kidney disease, or unspecified chronic kidney disease: Secondary | ICD-10-CM | POA: Diagnosis not present

## 2022-05-12 DIAGNOSIS — N1832 Chronic kidney disease, stage 3b: Secondary | ICD-10-CM | POA: Diagnosis not present

## 2022-05-12 DIAGNOSIS — R42 Dizziness and giddiness: Secondary | ICD-10-CM | POA: Diagnosis not present

## 2022-05-12 DIAGNOSIS — R5383 Other fatigue: Secondary | ICD-10-CM | POA: Diagnosis not present

## 2022-05-12 DIAGNOSIS — G3183 Dementia with Lewy bodies: Secondary | ICD-10-CM | POA: Diagnosis not present

## 2022-05-12 DIAGNOSIS — E1122 Type 2 diabetes mellitus with diabetic chronic kidney disease: Secondary | ICD-10-CM | POA: Diagnosis not present

## 2022-05-12 DIAGNOSIS — F0283 Dementia in other diseases classified elsewhere, unspecified severity, with mood disturbance: Secondary | ICD-10-CM | POA: Diagnosis not present

## 2022-05-12 DIAGNOSIS — F32A Depression, unspecified: Secondary | ICD-10-CM | POA: Diagnosis not present

## 2022-05-12 MED ORDER — MEMANTINE HCL 10 MG PO TABS: 10.0000 mg | ORAL_TABLET | Freq: Two times a day (BID) | ORAL | 5 refills | Status: DC

## 2022-05-12 NOTE — Progress Notes (Signed)
Subjective:    Patient ID: [name], female    DOB: 1928/11/19, 88 y.o.   MRN: [id number]  HPI 88 year old female who  has a past medical history of Anemia, Chronic fatigue, Chronic renal insufficiency, stage III (moderate) ([place]), Depression, HYPERTENSION (04/04/2007), HYPOTHYROIDISM (04/04/2007), Lewy body dementia ([place]), and Macular degeneration.  She presents to the office today with her son for hospital follow-up roughly 1 week ago.  She presented to the emergency room with decreased responsiveness, slurred speech, word finding difficulty and right-sided facial droop.  EMS was called to the home with the symptoms in which she reported that she accidentally took multiple days of her home medication.  When EMS arrived she was also noted to have right lower extremity weakness and difficulty ambulating.  In the ER she was able to move all extremities without difficulty, was having word finding difficulties and no slurred speech.  Work-up including EKG showed sinus rhythm.  No QTc elongation.  CBC showed chronic anemia but no significant leukocytosis, CMP revealed baseline creatinine of 1.3, CT head with no significant intracranial abnormality  Today her son reports that she was doing much better throughout the week but earlier this morning she started to feel fatigued and dizzy. She did not take any extra medication.  She denies any other symptoms such as headache, blurred vision, chest pain, or shortness of breath.   Review of Systems See HPI   Past Medical History:  Diagnosis Date   Anemia    Chronic fatigue    Chronic renal insufficiency, stage III (moderate) (HCC)    Depression    HYPERTENSION 04/04/2007   HYPOTHYROIDISM 04/04/2007   Lewy body dementia ([place])    Macular degeneration     Social History   Socioeconomic History   Marital status: Widowed    Spouse name: Not on file   Number of children: 1   Years of education: 9th grade   Highest education level: 9th grade   Occupational History   Occupation: Retired    Fish farm manager: RETIRED  Tobacco Use   Smoking status: Former    Packs/day: 1.00    Years: 7.00    Total pack years: 7.00    Types: Cigarettes    Quit date: 10/13/1969    Years since quitting: 52.6   Smokeless tobacco: Never  Vaping Use   Vaping Use: Never used  Substance and Sexual Activity   Alcohol use: No   Drug use: No   Sexual activity: Not Currently  Other Topics Concern   Not on file  Social History Narrative   Widowed   1 son   Right handed    Lives alone in a one story home   Caffeine 1 cup daily   Social Determinants of Health   Financial Resource Strain: Low Risk  (09/25/2020)   Overall Financial Resource Strain (CARDIA)    Difficulty of Paying Living Expenses: Not hard at all  Food Insecurity: No Food Insecurity (09/25/2020)   Hunger Vital Sign    Worried About Running Out of Food in the Last Year: Never true    [place] in the Last Year: Never true  Transportation Needs: No Transportation Needs (09/25/2020)   PRAPARE - Hydrologist (Medical): No    Lack of Transportation (Non-Medical): No  Physical Activity: Inactive (09/25/2020)   Exercise Vital Sign    Days of Exercise per Week: 0 days    Minutes of Exercise per Session:  0 min  Stress: No Stress Concern Present (09/25/2020)   [place]    Feeling of Stress : Not at all  Social Connections: Moderately Isolated (09/25/2020)   Social Connection and Isolation Panel [NHANES]    Frequency of Communication with Friends and Family: More than three times a week    Frequency of Social Gatherings with Friends and Family: More than three times a week    Attends Religious Services: More than 4 times per year    Active Member of [employer] or Organizations: No    Attends Archivist Meetings: Never    Marital Status: Widowed  Intimate Partner Violence: Not At Risk  (09/25/2020)   Humiliation, Afraid, Rape, and Kick questionnaire    Fear of Current or Ex-Partner: No    Emotionally Abused: No    Physically Abused: No    Sexually Abused: No    Past Surgical History:  Procedure Laterality Date   ABDOMINAL HYSTERECTOMY     APPENDECTOMY     BACK SURGERY  2012   Cataracts     CHOLECYSTECTOMY     ERCP N/A 11/22/2014   Procedure: ENDOSCOPIC RETROGRADE CHOLANGIOPANCREATOGRAPHY (ERCP);  Surgeon: Clarene Essex, MD;  Location: [facility] ENDOSCOPY;  Service: Endoscopy;  Laterality: N/A;   HERNIA REPAIR     OOPHORECTOMY     ORIF TIBIA FRACTURE  2012   dr Sharol Given   SPINE SURGERY  2011   cervical    Family History  Problem Relation Age of Onset   Kidney cancer Mother 26       renal cell CA   Heart disease Father    Lymphoma Son    Breast cancer Daughter     Allergies  Allergen Reactions   Oxycodone Hcl Itching    Current Outpatient Medications on File Prior to Visit  Medication Sig Dispense Refill   amLODipine (NORVASC) 10 MG tablet TAKE 1 TABLET BY MOUTH  DAILY (Patient taking differently: Take 10 mg by mouth in the morning.) 60 tablet 5   diphenhydramine-acetaminophen (TYLENOL PM) 25-500 MG TABS tablet Take 1 tablet by mouth at bedtime as needed (for pain/sleep).     donepezil (ARICEPT) 10 MG tablet Take 1 tablet daily (Patient taking differently: Take 10 mg by mouth at bedtime. Take 1 tablet daily) 90 tablet 3   levothyroxine (SYNTHROID) 150 MCG tablet TAKE 1 TABLET BY MOUTH  DAILY (Patient taking differently: Take 150 mcg by mouth daily before breakfast.) 60 tablet 5   Multiple Vitamins-Minerals (ONE-A-DAY WOMENS 50+) TABS Take 1 tablet by mouth daily with breakfast.     Multiple Vitamins-Minerals (PRESERVISION AREDS 2 PO) Take 1 capsule by mouth daily with breakfast.     olmesartan (BENICAR) 20 MG tablet TAKE 1 TABLET BY MOUTH  DAILY (Patient taking differently: Take 20 mg by mouth in the morning.) 60 tablet 5   omeprazole (PRILOSEC) 40 MG capsule TAKE 1  CAPSULE BY MOUTH  DAILY (Patient taking differently: Take 40 mg by mouth daily before breakfast.) 60 capsule 5   QUEtiapine (SEROQUEL) 50 MG tablet Take 1 tablet twice a day (Patient taking differently: Take 50 mg by mouth in the morning and at bedtime.) 180 tablet 3   sertraline (ZOLOFT) 100 MG tablet Take 1 tablet (100 mg total) by mouth daily. 90 tablet 3   SYSTANE ULTRA PF 0.4-0.3 % SOLN Place 1 drop into both eyes 3 (three) times daily as needed (for dryness).     TYLENOL 500  MG tablet Take 500 mg by mouth every 6 (six) hours as needed for mild pain or headache.     No current facility-administered medications on file prior to visit.    BP (!) 160/62 (BP Location: Right Arm, Patient Position: Sitting, Cuff Size: Normal)   Pulse 61   Temp 97.8 F (36.6 C) (Oral)   Wt 147 lb 6.4 oz (66.9 kg)   SpO2 97%   BMI 25.30 kg/m       Objective:   Physical Exam Vitals and nursing note reviewed.  Constitutional:      Appearance: Normal appearance.  Eyes:     Extraocular Movements: Extraocular movements intact.     Pupils: Pupils are equal, round, and reactive to light.  Cardiovascular:     Rate and Rhythm: Normal rate and regular rhythm.     Pulses: Normal pulses.     Heart sounds: Normal heart sounds.  Pulmonary:     Effort: Pulmonary effort is normal.     Breath sounds: Normal breath sounds.  Skin:    General: Skin is warm and dry.     Capillary Refill: Capillary refill takes less than 2 seconds.  Neurological:     General: No focal deficit present.     Mental Status: She is alert and oriented to person, place, and time.  Psychiatric:        Mood and Affect: Mood normal.        Behavior: Behavior normal.        Thought Content: Thought content normal.        Judgment: Judgment normal.       Assessment & Plan:  1. Other fatigue -Unknown cause.  She does not want a large work-up done is okay with some blood work.  She was advised in the emergency room to have an MRI but she  refused to have this done, she continues to refuse.  Her son will keep an eye on her over the week and let me know how she is dealing. - CBC with Differential/Platelet; Future - Basic Metabolic Panel; Future - Basic Metabolic Panel - CBC with Differential/Platelet  2. Dizziness  - CBC with Differential/Platelet; Future - Basic Metabolic Panel; Future - Basic Metabolic Panel - CBC with Differential/Platelet  Dorothyann Peng, NP  Time spent with patient today was 33 minutes which consisted of chart review, discussing medication management, dizziness, and fatigue,work up, treatment answering questions and documentation.

## 2022-05-13 DIAGNOSIS — G3183 Dementia with Lewy bodies: Secondary | ICD-10-CM | POA: Diagnosis not present

## 2022-05-13 DIAGNOSIS — N1832 Chronic kidney disease, stage 3b: Secondary | ICD-10-CM | POA: Diagnosis not present

## 2022-05-13 DIAGNOSIS — F0283 Dementia in other diseases classified elsewhere, unspecified severity, with mood disturbance: Secondary | ICD-10-CM | POA: Diagnosis not present

## 2022-05-13 DIAGNOSIS — F32A Depression, unspecified: Secondary | ICD-10-CM | POA: Diagnosis not present

## 2022-05-13 DIAGNOSIS — I129 Hypertensive chronic kidney disease with stage 1 through stage 4 chronic kidney disease, or unspecified chronic kidney disease: Secondary | ICD-10-CM | POA: Diagnosis not present

## 2022-05-13 DIAGNOSIS — E1122 Type 2 diabetes mellitus with diabetic chronic kidney disease: Secondary | ICD-10-CM | POA: Diagnosis not present

## 2022-05-13 LAB — CBC WITH DIFFERENTIAL/PLATELET
Basophils Absolute: 0.1 10*3/uL (ref 0.0–0.1)
Basophils Relative: 1 % (ref 0.0–3.0)
Eosinophils Absolute: 0.3 10*3/uL (ref 0.0–0.7)
Eosinophils Relative: 4.3 % (ref 0.0–5.0)
HCT: 31.4 % — ABNORMAL LOW (ref 36.0–46.0)
Hemoglobin: 10.6 g/dL — ABNORMAL LOW (ref 12.0–15.0)
Lymphocytes Relative: 27.3 % (ref 12.0–46.0)
Lymphs Abs: 2 10*3/uL (ref 0.7–4.0)
MCHC: 33.7 g/dL (ref 30.0–36.0)
MCV: 91 fl (ref 78.0–100.0)
Monocytes Absolute: 0.7 10*3/uL (ref 0.1–1.0)
Monocytes Relative: 10 % (ref 3.0–12.0)
Neutro Abs: 4.3 10*3/uL (ref 1.4–7.7)
Neutrophils Relative %: 57.4 % (ref 43.0–77.0)
Platelets: 318 10*3/uL (ref 150.0–400.0)
RBC: 3.45 Mil/uL — ABNORMAL LOW (ref 3.87–5.11)
RDW: 14.8 % (ref 11.5–15.5)
WBC: 7.4 10*3/uL (ref 4.0–10.5)

## 2022-05-13 LAB — BASIC METABOLIC PANEL
BUN: 21 mg/dL (ref 6–23)
CO2: 27 mEq/L (ref 19–32)
Calcium: 9.1 mg/dL (ref 8.4–10.5)
Chloride: 102 mEq/L (ref 96–112)
Creatinine, Ser: 1.45 mg/dL — ABNORMAL HIGH (ref 0.40–1.20)
GFR: 31.65 mL/min — ABNORMAL LOW (ref >60.00)
Glucose, Bld: 97 mg/dL (ref 70–99)
Potassium: 4.5 mEq/L (ref 3.5–5.1)
Sodium: 137 mEq/L (ref 135–145)

## 2022-05-13 NOTE — Telephone Encounter (Signed)
Call [place]. Left a voicemail to call us back.

## 2022-05-14 DIAGNOSIS — G3183 Dementia with Lewy bodies: Secondary | ICD-10-CM | POA: Diagnosis not present

## 2022-05-14 DIAGNOSIS — F0283 Dementia in other diseases classified elsewhere, unspecified severity, with mood disturbance: Secondary | ICD-10-CM | POA: Diagnosis not present

## 2022-05-14 DIAGNOSIS — I129 Hypertensive chronic kidney disease with stage 1 through stage 4 chronic kidney disease, or unspecified chronic kidney disease: Secondary | ICD-10-CM | POA: Diagnosis not present

## 2022-05-14 DIAGNOSIS — N1832 Chronic kidney disease, stage 3b: Secondary | ICD-10-CM | POA: Diagnosis not present

## 2022-05-14 DIAGNOSIS — E1122 Type 2 diabetes mellitus with diabetic chronic kidney disease: Secondary | ICD-10-CM | POA: Diagnosis not present

## 2022-05-14 DIAGNOSIS — F32A Depression, unspecified: Secondary | ICD-10-CM | POA: Diagnosis not present

## 2022-05-14 NOTE — Telephone Encounter (Signed)
Left detailed message informing Cecile Hearing of verbal orders

## 2022-05-18 DIAGNOSIS — E1122 Type 2 diabetes mellitus with diabetic chronic kidney disease: Secondary | ICD-10-CM | POA: Diagnosis not present

## 2022-05-18 DIAGNOSIS — F0283 Dementia in other diseases classified elsewhere, unspecified severity, with mood disturbance: Secondary | ICD-10-CM | POA: Diagnosis not present

## 2022-05-18 DIAGNOSIS — I129 Hypertensive chronic kidney disease with stage 1 through stage 4 chronic kidney disease, or unspecified chronic kidney disease: Secondary | ICD-10-CM | POA: Diagnosis not present

## 2022-05-18 DIAGNOSIS — F32A Depression, unspecified: Secondary | ICD-10-CM | POA: Diagnosis not present

## 2022-05-18 DIAGNOSIS — G3183 Dementia with Lewy bodies: Secondary | ICD-10-CM | POA: Diagnosis not present

## 2022-05-18 DIAGNOSIS — N1832 Chronic kidney disease, stage 3b: Secondary | ICD-10-CM | POA: Diagnosis not present

## 2022-05-19 DIAGNOSIS — E1122 Type 2 diabetes mellitus with diabetic chronic kidney disease: Secondary | ICD-10-CM | POA: Diagnosis not present

## 2022-05-19 DIAGNOSIS — F32A Depression, unspecified: Secondary | ICD-10-CM | POA: Diagnosis not present

## 2022-05-19 DIAGNOSIS — N1832 Chronic kidney disease, stage 3b: Secondary | ICD-10-CM | POA: Diagnosis not present

## 2022-05-19 DIAGNOSIS — I129 Hypertensive chronic kidney disease with stage 1 through stage 4 chronic kidney disease, or unspecified chronic kidney disease: Secondary | ICD-10-CM | POA: Diagnosis not present

## 2022-05-19 DIAGNOSIS — G3183 Dementia with Lewy bodies: Secondary | ICD-10-CM | POA: Diagnosis not present

## 2022-05-19 DIAGNOSIS — F0283 Dementia in other diseases classified elsewhere, unspecified severity, with mood disturbance: Secondary | ICD-10-CM | POA: Diagnosis not present

## 2022-05-21 DIAGNOSIS — G3183 Dementia with Lewy bodies: Secondary | ICD-10-CM | POA: Diagnosis not present

## 2022-05-21 DIAGNOSIS — I129 Hypertensive chronic kidney disease with stage 1 through stage 4 chronic kidney disease, or unspecified chronic kidney disease: Secondary | ICD-10-CM | POA: Diagnosis not present

## 2022-05-21 DIAGNOSIS — F32A Depression, unspecified: Secondary | ICD-10-CM | POA: Diagnosis not present

## 2022-05-21 DIAGNOSIS — E1122 Type 2 diabetes mellitus with diabetic chronic kidney disease: Secondary | ICD-10-CM | POA: Diagnosis not present

## 2022-05-21 DIAGNOSIS — N1832 Chronic kidney disease, stage 3b: Secondary | ICD-10-CM | POA: Diagnosis not present

## 2022-05-21 DIAGNOSIS — F0283 Dementia in other diseases classified elsewhere, unspecified severity, with mood disturbance: Secondary | ICD-10-CM | POA: Diagnosis not present

## 2022-05-26 ENCOUNTER — Other Ambulatory Visit: Payer: Self-pay

## 2022-05-28 DIAGNOSIS — E1122 Type 2 diabetes mellitus with diabetic chronic kidney disease: Secondary | ICD-10-CM | POA: Diagnosis not present

## 2022-05-28 DIAGNOSIS — F0283 Dementia in other diseases classified elsewhere, unspecified severity, with mood disturbance: Secondary | ICD-10-CM | POA: Diagnosis not present

## 2022-05-28 DIAGNOSIS — G3183 Dementia with Lewy bodies: Secondary | ICD-10-CM | POA: Diagnosis not present

## 2022-05-28 DIAGNOSIS — N1832 Chronic kidney disease, stage 3b: Secondary | ICD-10-CM | POA: Diagnosis not present

## 2022-05-28 DIAGNOSIS — F32A Depression, unspecified: Secondary | ICD-10-CM | POA: Diagnosis not present

## 2022-05-28 DIAGNOSIS — I129 Hypertensive chronic kidney disease with stage 1 through stage 4 chronic kidney disease, or unspecified chronic kidney disease: Secondary | ICD-10-CM | POA: Diagnosis not present

## 2022-06-02 DIAGNOSIS — E1122 Type 2 diabetes mellitus with diabetic chronic kidney disease: Secondary | ICD-10-CM | POA: Diagnosis not present

## 2022-06-02 DIAGNOSIS — F0283 Dementia in other diseases classified elsewhere, unspecified severity, with mood disturbance: Secondary | ICD-10-CM | POA: Diagnosis not present

## 2022-06-02 DIAGNOSIS — I129 Hypertensive chronic kidney disease with stage 1 through stage 4 chronic kidney disease, or unspecified chronic kidney disease: Secondary | ICD-10-CM | POA: Diagnosis not present

## 2022-06-02 DIAGNOSIS — F32A Depression, unspecified: Secondary | ICD-10-CM | POA: Diagnosis not present

## 2022-06-02 DIAGNOSIS — G3183 Dementia with Lewy bodies: Secondary | ICD-10-CM | POA: Diagnosis not present

## 2022-06-02 DIAGNOSIS — N1832 Chronic kidney disease, stage 3b: Secondary | ICD-10-CM | POA: Diagnosis not present

## 2022-06-04 ENCOUNTER — Telehealth: Payer: Self-pay

## 2022-06-04 NOTE — Telephone Encounter (Signed)
Per front office staff member "I have [name] MRN:  [id number] son [name] on the line u sent him a letter concerning blood work results"  Called pt son and advised of lab information. Pt son stated he will make sure our number is not blocked on his end.

## 2022-06-05 ENCOUNTER — Ambulatory Visit (INDEPENDENT_AMBULATORY_CARE_PROVIDER_SITE_OTHER): Payer: Medicare Other

## 2022-06-05 VITALS — Ht 62.0 in | Wt 147.0 lb

## 2022-06-05 DIAGNOSIS — F32A Depression, unspecified: Secondary | ICD-10-CM | POA: Diagnosis not present

## 2022-06-05 DIAGNOSIS — I129 Hypertensive chronic kidney disease with stage 1 through stage 4 chronic kidney disease, or unspecified chronic kidney disease: Secondary | ICD-10-CM | POA: Diagnosis not present

## 2022-06-05 DIAGNOSIS — G3183 Dementia with Lewy bodies: Secondary | ICD-10-CM | POA: Diagnosis not present

## 2022-06-05 DIAGNOSIS — E1122 Type 2 diabetes mellitus with diabetic chronic kidney disease: Secondary | ICD-10-CM | POA: Diagnosis not present

## 2022-06-05 DIAGNOSIS — Z0000 Encounter for general adult medical examination without abnormal findings: Secondary | ICD-10-CM

## 2022-06-05 DIAGNOSIS — F0283 Dementia in other diseases classified elsewhere, unspecified severity, with mood disturbance: Secondary | ICD-10-CM | POA: Diagnosis not present

## 2022-06-05 DIAGNOSIS — N1832 Chronic kidney disease, stage 3b: Secondary | ICD-10-CM | POA: Diagnosis not present

## 2022-06-05 NOTE — Progress Notes (Signed)
I connected with [name] today by telephone and verified that I am speaking with the correct person using two identifiers. Location patient: home Location provider: work Persons participating in the virtual visit: [name], [name] (son), Glenna Durand LPN.   I discussed the limitations, risks, security and privacy concerns of performing an evaluation and management service by telephone and the availability of in person appointments. I also discussed with the patient that there may be a patient responsible charge related to this service. The patient expressed understanding and verbally consented to this telephonic visit.    Interactive audio and video telecommunications were attempted between this provider and patient, however failed, due to patient having technical difficulties OR patient did not have access to video capability.  We continued and completed visit with audio only.     Vital signs may be patient reported or missing.  Subjective:   [name] is a 88 y.o. female who presents for Medicare Annual (Subsequent) preventive examination.  Review of Systems     Cardiac Risk Factors include: advanced age (>34mn, >>48women);hypertension     Objective:    Today's Vitals   06/05/22 1557  Weight: 147 lb (66.7 kg)  Height: '5\' 2"'$  (1.575 m)   Body mass index is 26.89 kg/m.     06/05/2022    4:02 PM 04/09/2021    3:27 PM 02/25/2021    5:49 PM 09/25/2020    3:41 PM 09/02/2020    2:30 PM 05/09/2020    8:40 AM 09/25/2019    2:22 PM  Advanced Directives  Does Patient Have a Medical Advance Directive? Yes Yes Yes Yes Yes Yes Yes  Type of AParamedicof [place] R[place]Living will [place]Living will [place]Living will HLower LakeLiving will HDivideLiving will   Does patient want to make changes to medical advance directive?   No - Patient declined No -  Patient declined   No - Patient declined  Copy of HRidgewayin Chart? No - copy requested   No - copy requested       Current Medications (verified) Outpatient Encounter Medications as of 06/05/2022  Medication Sig   amLODipine (NORVASC) 10 MG tablet TAKE 1 TABLET BY MOUTH  DAILY (Patient taking differently: Take 10 mg by mouth in the morning.)   diphenhydramine-acetaminophen (TYLENOL PM) 25-500 MG TABS tablet Take 1 tablet by mouth at bedtime as needed (for pain/sleep).   donepezil (ARICEPT) 10 MG tablet Take 1 tablet daily (Patient taking differently: Take 10 mg by mouth at bedtime. Take 1 tablet daily)   levothyroxine (SYNTHROID) 150 MCG tablet TAKE 1 TABLET BY MOUTH  DAILY (Patient taking differently: Take 150 mcg by mouth daily before breakfast.)   memantine (NAMENDA) 10 MG tablet Take 1 tablet (10 mg total) by mouth in the morning and at bedtime.   Multiple Vitamins-Minerals (ONE-A-DAY WOMENS 50+) TABS Take 1 tablet by mouth daily with breakfast.   Multiple Vitamins-Minerals (PRESERVISION AREDS 2 PO) Take 1 capsule by mouth daily with breakfast.   olmesartan (BENICAR) 20 MG tablet TAKE 1 TABLET BY MOUTH  DAILY (Patient taking differently: Take 20 mg by mouth in the morning.)   omeprazole (PRILOSEC) 40 MG capsule TAKE 1 CAPSULE BY MOUTH  DAILY (Patient taking differently: Take 40 mg by mouth daily before breakfast.)   QUEtiapine (SEROQUEL) 50 MG tablet Take 1 tablet twice a day (Patient taking differently: Take 50  mg by mouth in the morning and at bedtime.)   sertraline (ZOLOFT) 100 MG tablet Take 1 tablet (100 mg total) by mouth daily.   SYSTANE ULTRA PF 0.4-0.3 % SOLN Place 1 drop into both eyes 3 (three) times daily as needed (for dryness).   TYLENOL 500 MG tablet Take 500 mg by mouth every 6 (six) hours as needed for mild pain or headache.   No facility-administered encounter medications on file as of 06/05/2022.    Allergies (verified) Oxycodone hcl    History: Past Medical History:  Diagnosis Date   Anemia    Chronic fatigue    Chronic renal insufficiency, stage III (moderate) ([place])    Depression    HYPERTENSION 04/04/2007   HYPOTHYROIDISM 04/04/2007   Lewy body dementia ([place])    Macular degeneration    Past Surgical History:  Procedure Laterality Date   ABDOMINAL HYSTERECTOMY     APPENDECTOMY     BACK SURGERY  2012   Cataracts     CHOLECYSTECTOMY     ERCP N/A 11/22/2014   Procedure: ENDOSCOPIC RETROGRADE CHOLANGIOPANCREATOGRAPHY (ERCP);  Surgeon: Clarene Essex, MD;  Location: [facility] ENDOSCOPY;  Service: Endoscopy;  Laterality: N/A;   HERNIA REPAIR     OOPHORECTOMY     ORIF TIBIA FRACTURE  2012   dr Sharol Given   SPINE SURGERY  2011   cervical   Family History  Problem Relation Age of Onset   Kidney cancer Mother 30       renal cell CA   Heart disease Father    Lymphoma Son    Breast cancer Daughter    Social History   Socioeconomic History   Marital status: Widowed    Spouse name: Not on file   Number of children: 1   Years of education: 9th grade   Highest education level: 9th grade  Occupational History   Occupation: Retired    Fish farm manager: RETIRED  Tobacco Use   Smoking status: Former    Packs/day: 1.00    Years: 7.00    Total pack years: 7.00    Types: Cigarettes    Quit date: 10/13/1969    Years since quitting: 52.6   Smokeless tobacco: Never  Vaping Use   Vaping Use: Never used  Substance and Sexual Activity   Alcohol use: No   Drug use: No   Sexual activity: Not Currently  Other Topics Concern   Not on file  Social History Narrative   Widowed   1 son   Right handed    Lives alone in a one story home   Caffeine 1 cup daily   Social Determinants of Health   Financial Resource Strain: Low Risk  (06/05/2022)   Overall Financial Resource Strain (CARDIA)    Difficulty of Paying Living Expenses: Not hard at all  Food Insecurity: No Food Insecurity (06/05/2022)   Hunger Vital Sign    Worried About  Running Out of Food in the Last Year: Never true    [place] in the Last Year: Never true  Transportation Needs: No Transportation Needs (06/05/2022)   PRAPARE - Hydrologist (Medical): No    Lack of Transportation (Non-Medical): No  Physical Activity: Inactive (06/05/2022)   Exercise Vital Sign    Days of Exercise per Week: 0 days    Minutes of Exercise per Session: 0 min  Stress: Stress Concern Present (06/05/2022)   [place]  Feeling of Stress : To some extent  Social Connections: Moderately Isolated (09/25/2020)   Social Connection and Isolation Panel [NHANES]    Frequency of Communication with Friends and Family: More than three times a week    Frequency of Social Gatherings with Friends and Family: More than three times a week    Attends Religious Services: More than 4 times per year    Active Member of [employer] or Organizations: No    Attends Archivist Meetings: Never    Marital Status: Widowed    Tobacco Counseling Counseling given: Not Answered   Clinical Intake:  Pre-visit preparation completed: Yes  Pain : No/denies pain     Nutritional Status: BMI 25 -29 Overweight Nutritional Risks: None Diabetes: No  How often do you need to have someone help you when you read instructions, pamphlets, or other written materials from your doctor or pharmacy?: 1 - Never  Diabetic? no  Interpreter Needed?: No  Information entered by :: NAllen LPN   Activities of Daily Living    06/05/2022    4:05 PM  In your present state of health, do you have any difficulty performing the following activities:  Hearing? 0  Vision? 1  Comment macular degeneration  Difficulty concentrating or making decisions? 1  Walking or climbing stairs? 1  Dressing or bathing? 0  Doing errands, shopping? 1  Preparing Food and eating ? N  Using the Toilet? N  In the past six  months, have you accidently leaked urine? Y  Do you have problems with loss of bowel control? N  Managing your Medications? Y  Managing your Finances? Y  Housekeeping or managing your Housekeeping? Y    Patient Care Team: Dorothyann Peng, NP as PCP - General (Family Medicine) Cameron Sprang, MD as Consulting Physician (Neurology)  Indicate any recent Medical Services you may have received from other than [facility] providers in the past year (date may be approximate).     Assessment:   This is a routine wellness examination for [name].  Hearing/Vision screen Vision Screening - Comments:: Regular eye exams, Dr. Zigmund Daniel  Dietary issues and exercise activities discussed: Current Exercise Habits: The patient does not participate in regular exercise at present   Goals Addressed             This Visit's Progress    Patient Stated       06/05/2022, no goals       Depression Screen    06/05/2022    4:05 PM 07/08/2021   12:41 PM 09/25/2020    3:43 PM 09/25/2019    2:21 PM 06/30/2018    1:26 PM 06/15/2017    3:13 PM 01/15/2017    8:48 AM  PHQ 2/9 Scores  PHQ - 2 Score 0 3 0 2 0 2 0  PHQ- 9 Score  '8  3  9     '$ Fall Risk    06/05/2022    4:02 PM 04/09/2021    3:27 PM 09/25/2020    3:42 PM 09/02/2020    2:30 PM 05/09/2020    8:40 AM  Fall Risk   Falls in the past year? 1 1 0 0 0  Comment pulling weeds out, took too much night time meds      Number falls in past yr: 1 0 0 0 0  Injury with Fall? 0 1 0 0 0  Risk for fall due to : History of fall(s);Impaired balance/gait;Impaired mobility;Medication side effect  No Fall Risks  Follow up Falls evaluation completed;Education provided;Falls prevention discussed  Falls evaluation completed;Falls prevention discussed      FALL RISK PREVENTION PERTAINING TO THE HOME:  Any stairs in or around the home? No  If so, are there any without handrails?  N/a Home free of loose throw rugs in walkways, pet beds, electrical cords, etc? Yes   Adequate lighting in your home to reduce risk of falls? Yes   ASSISTIVE DEVICES UTILIZED TO PREVENT FALLS:  Life alert? No  Use of a cane, walker or w/c? Yes  Grab bars in the bathroom? Yes  Shower chair or bench in shower? Yes  Elevated toilet seat or a handicapped toilet? Yes   TIMED UP AND GO:  Was the test performed? No .      Cognitive Function:    04/15/2022    3:00 PM 04/19/2021    1:00 PM 10/06/2019   12:23 PM  MMSE - Mini Mental State Exam  Orientation to time '1 2 5  '$ Orientation to Place '4 5 5  '$ Registration '3 3 3  '$ Attention/ Calculation '2 2 5  '$ Recall 0 0 1  Language- name 2 objects '1 2 2  '$ Language- repeat '1 1 1  '$ Language- follow 3 step command '3 1 3  '$ Language- read & follow direction 1 0 1  Write a sentence 0 1 1  Copy design 0 0 0  Total score '16 17 27      '$ 05/09/2020    9:00 AM  Montreal Cognitive Assessment   Visuospatial/ Executive (0/5) 0  Naming (0/3) 3  Attention: Read list of digits (0/2) 2  Attention: Read list of letters (0/1) 0  Attention: Serial 7 subtraction starting at 100 (0/3) 1  Language: Repeat phrase (0/2) 0  Language : Fluency (0/1) 1  Abstraction (0/2) 0  Delayed Recall (0/5) 0  Orientation (0/6) 4  Total 11  Adjusted Score (based on education) 12      09/25/2019    1:49 PM  6CIT Screen  What Year? 0 points  What month? 0 points  What time? 0 points  Count back from 20 0 points  Months in reverse 4 points  Repeat phrase 8 points  Total Score 12 points    Immunizations Immunization History  Administered Date(s) Administered   Fluad Quad(high Dose 65+) 03/31/2019, 07/08/2021   Influenza Split 05/17/2012   Influenza Whole 08/18/2007, 05/24/2009, 05/06/2010, 05/10/2012   Influenza, High Dose Seasonal PF 06/23/2013, 07/25/2014, 07/07/2016, 06/17/2017   Influenza-Unspecified 07/15/2015   PFIZER(Purple Top)SARS-COV-2 Vaccination 08/25/2019, 09/17/2019   Pneumococcal Conjugate-13 02/11/2017   Pneumococcal Polysaccharide-23  08/18/2007   Tdap 09/07/2011    TDAP status: Due, Education has been provided regarding the importance of this vaccine. Advised may receive this vaccine at local pharmacy or Health Dept. Aware to provide a copy of the vaccination record if obtained from local pharmacy or Health Dept. Verbalized acceptance and understanding.  Flu Vaccine status: Due, Education has been provided regarding the importance of this vaccine. Advised may receive this vaccine at local pharmacy or Health Dept. Aware to provide a copy of the vaccination record if obtained from local pharmacy or Health Dept. Verbalized acceptance and understanding.  Pneumococcal vaccine status: Up to date  Covid-19 vaccine status: Completed vaccines  Qualifies for Shingles Vaccine? Yes   Zostavax completed No   Shingrix Completed?: No.    Education has been provided regarding the importance of this vaccine. Patient has been advised to call insurance company to determine out of  pocket expense if they have not yet received this vaccine. Advised may also receive vaccine at local pharmacy or Health Dept. Verbalized acceptance and understanding.  Screening Tests Health Maintenance  Topic Date Due   Zoster Vaccines- Shingrix (1 of 2) Never done   TETANUS/TDAP  09/06/2021   Medicare Annual Wellness (AWV)  09/25/2021   INFLUENZA VACCINE  03/10/2022   Pneumonia Vaccine 5+ Years old  Completed   HPV VACCINES  Aged Out   COVID-19 Vaccine  Discontinued    Health Maintenance  Health Maintenance Due  Topic Date Due   Zoster Vaccines- Shingrix (1 of 2) Never done   TETANUS/TDAP  09/06/2021   Medicare Annual Wellness (AWV)  09/25/2021   INFLUENZA VACCINE  03/10/2022    Colorectal cancer screening: No longer required.   Mammogram status: No longer required due to age.  Bone Density status: n/a  Lung Cancer Screening: (Low Dose CT Chest recommended if Age 57-80 years, 30 pack-year currently smoking OR have quit w/in 15years.) does not  qualify.   Lung Cancer Screening Referral: no  Additional Screening:  Hepatitis C Screening: does not qualify;   Vision Screening: Recommended annual ophthalmology exams for early detection of glaucoma and other disorders of the eye. Is the patient up to date with their annual eye exam?  Yes  Who is the provider or what is the name of the office in which the patient attends annual eye exams? Dr. Zigmund Daniel If pt is not established with a provider, would they like to be referred to a provider to establish care? No .   Dental Screening: Recommended annual dental exams for proper oral hygiene  Community Resource Referral / Chronic Care Management: CRR required this visit?  No   CCM required this visit?  No      Plan:     I have personally reviewed and noted the following in the patient's chart:   Medical and social history Use of alcohol, tobacco or illicit drugs  Current medications and supplements including opioid prescriptions. Patient is not currently taking opioid prescriptions. Functional ability and status Nutritional status Physical activity Advanced directives List of other physicians Hospitalizations, surgeries, and ER visits in previous 12 months Vitals Screenings to include cognitive, depression, and falls Referrals and appointments  In addition, I have reviewed and discussed with patient certain preventive protocols, quality metrics, and best practice recommendations. A written personalized care plan for preventive services as well as general preventive health recommendations were provided to patient.     Kellie Simmering, LPN   [id number]   Nurse Notes: 6 CIT not administered. Patient has diagnosed with cognitive impairment. Followed by neurology  Due to this being a virtual visit, the after visit summary with patients personalized plan was offered to patient via mail or my-chart.  to pick up at office at next visit

## 2022-06-05 NOTE — Patient Instructions (Signed)
[name] , Thank you for taking time to come for your Medicare Wellness Visit. I appreciate your ongoing commitment to your health goals. Please review the following plan we discussed and let me know if I can assist you in the future.   Screening recommendations/referrals: Colonoscopy: not required Mammogram: not required Bone Density: n/a Recommended yearly ophthalmology/optometry visit for glaucoma screening and checkup Recommended yearly dental visit for hygiene and checkup  Vaccinations: Influenza vaccine: due Pneumococcal vaccine: completed 02/11/2017 Tdap vaccine: due Shingles vaccine: discussed   Covid-19: 09/17/2019, 08/25/2019  Advanced directives: Please bring a copy of your POA (Power of Attorney) and/or Living Will to your next appointment.   Conditions/risks identified: none  Next appointment: Follow up in one year for your annual wellness visit    Preventive Care 65 Years and Older, Female Preventive care refers to lifestyle choices and visits with your health care provider that can promote health and wellness. What does preventive care include? A yearly physical exam. This is also called an annual well check. Dental exams once or twice a year. Routine eye exams. Ask your health care provider how often you should have your eyes checked. Personal lifestyle choices, including: Daily care of your teeth and gums. Regular physical activity. Eating a healthy diet. Avoiding tobacco and drug use. Limiting alcohol use. Practicing safe sex. Taking low-dose aspirin every day. Taking vitamin and mineral supplements as recommended by your health care provider. What happens during an annual well check? The services and screenings done by your health care provider during your annual well check will depend on your age, overall health, lifestyle risk factors, and family history of disease. Counseling  Your health care provider may ask you questions about your: Alcohol use. Tobacco  use. Drug use. Emotional well-being. Home and relationship well-being. Sexual activity. Eating habits. History of falls. Memory and ability to understand (cognition). Work and work Statistician. Reproductive health. Screening  You may have the following tests or measurements: Height, weight, and BMI. Blood pressure. Lipid and cholesterol levels. These may be checked every 5 years, or more frequently if you are over 72 years old. Skin check. Lung cancer screening. You may have this screening every year starting at age 44 if you have a 30-pack-year history of smoking and currently smoke or have quit within the past 15 years. Fecal occult blood test (FOBT) of the stool. You may have this test every year starting at age 6. Flexible sigmoidoscopy or colonoscopy. You may have a sigmoidoscopy every 5 years or a colonoscopy every 10 years starting at age 69. Hepatitis C blood test. Hepatitis B blood test. Sexually transmitted disease (STD) testing. Diabetes screening. This is done by checking your blood sugar (glucose) after you have not eaten for a while (fasting). You may have this done every 1-3 years. Bone density scan. This is done to screen for osteoporosis. You may have this done starting at age 40. Mammogram. This may be done every 1-2 years. Talk to your health care provider about how often you should have regular mammograms. Talk with your health care provider about your test results, treatment options, and if necessary, the need for more tests. Vaccines  Your health care provider may recommend certain vaccines, such as: Influenza vaccine. This is recommended every year. Tetanus, diphtheria, and acellular pertussis (Tdap, Td) vaccine. You may need a Td booster every 10 years. Zoster vaccine. You may need this after age 74. Pneumococcal 13-valent conjugate (PCV13) vaccine. One dose is recommended after age 9. Pneumococcal  polysaccharide (PPSV23) vaccine. One dose is recommended  after age 63. Talk to your health care provider about which screenings and vaccines you need and how often you need them. This information is not intended to replace advice given to you by your health care provider. Make sure you discuss any questions you have with your health care provider. Document Released: 08/23/2015 Document Revised: 04/15/2016 Document Reviewed: 05/28/2015 Elsevier Interactive Patient Education  2017 [place] Prevention in the Home Falls can cause injuries. They can happen to people of all ages. There are many things you can do to make your home safe and to help prevent falls. What can I do on the outside of my home? Regularly fix the edges of walkways and driveways and fix any cracks. Remove anything that might make you trip as you walk through a door, such as a raised step or threshold. Trim any bushes or trees on the path to your home. Use bright outdoor lighting. Clear any walking paths of anything that might make someone trip, such as rocks or tools. Regularly check to see if handrails are loose or broken. Make sure that both sides of any steps have handrails. Any raised decks and porches should have guardrails on the edges. Have any leaves, snow, or ice cleared regularly. Use sand or salt on walking paths during winter. Clean up any spills in your garage right away. This includes oil or grease spills. What can I do in the bathroom? Use night lights. Install grab bars by the toilet and in the tub and shower. Do not use towel bars as grab bars. Use non-skid mats or decals in the tub or shower. If you need to sit down in the shower, use a plastic, non-slip stool. Keep the floor dry. Clean up any water that spills on the floor as soon as it happens. Remove soap buildup in the tub or shower regularly. Attach bath mats securely with double-sided non-slip rug tape. Do not have throw rugs and other things on the floor that can make you trip. What can I do  in the bedroom? Use night lights. Make sure that you have a light by your bed that is easy to reach. Do not use any sheets or blankets that are too big for your bed. They should not hang down onto the floor. Have a firm chair that has side arms. You can use this for support while you get dressed. Do not have throw rugs and other things on the floor that can make you trip. What can I do in the kitchen? Clean up any spills right away. Avoid walking on wet floors. Keep items that you use a lot in easy-to-reach places. If you need to reach something above you, use a strong step stool that has a grab bar. Keep electrical cords out of the way. Do not use floor polish or wax that makes floors slippery. If you must use wax, use non-skid floor wax. Do not have throw rugs and other things on the floor that can make you trip. What can I do with my stairs? Do not leave any items on the stairs. Make sure that there are handrails on both sides of the stairs and use them. Fix handrails that are broken or loose. Make sure that handrails are as long as the stairways. Check any carpeting to make sure that it is firmly attached to the stairs. Fix any carpet that is loose or worn. Avoid having throw rugs at the top  or bottom of the stairs. If you do have throw rugs, attach them to the floor with carpet tape. Make sure that you have a light switch at the top of the stairs and the bottom of the stairs. If you do not have them, ask someone to add them for you. What else can I do to help prevent falls? Wear shoes that: Do not have high heels. Have rubber bottoms. Are comfortable and fit you well. Are closed at the toe. Do not wear sandals. If you use a stepladder: Make sure that it is fully opened. Do not climb a closed stepladder. Make sure that both sides of the stepladder are locked into place. Ask someone to hold it for you, if possible. Clearly mark and make sure that you can see: Any grab bars or  handrails. First and last steps. Where the edge of each step is. Use tools that help you move around (mobility aids) if they are needed. These include: Canes. Walkers. Scooters. Crutches. Turn on the lights when you go into a dark area. Replace any light bulbs as soon as they burn out. Set up your furniture so you have a clear path. Avoid moving your furniture around. If any of your floors are uneven, fix them. If there are any pets around you, be aware of where they are. Review your medicines with your doctor. Some medicines can make you feel dizzy. This can increase your chance of falling. Ask your doctor what other things that you can do to help prevent falls. This information is not intended to replace advice given to you by your health care provider. Make sure you discuss any questions you have with your health care provider. Document Released: 05/23/2009 Document Revised: 01/02/2016 Document Reviewed: 08/31/2014 Elsevier Interactive Patient Education  2017 [employer].

## 2022-06-06 DIAGNOSIS — G3183 Dementia with Lewy bodies: Secondary | ICD-10-CM | POA: Diagnosis not present

## 2022-06-06 DIAGNOSIS — N1832 Chronic kidney disease, stage 3b: Secondary | ICD-10-CM | POA: Diagnosis not present

## 2022-06-06 DIAGNOSIS — R5382 Chronic fatigue, unspecified: Secondary | ICD-10-CM | POA: Diagnosis not present

## 2022-06-06 DIAGNOSIS — E1165 Type 2 diabetes mellitus with hyperglycemia: Secondary | ICD-10-CM | POA: Diagnosis not present

## 2022-06-06 DIAGNOSIS — F0283 Dementia in other diseases classified elsewhere, unspecified severity, with mood disturbance: Secondary | ICD-10-CM | POA: Diagnosis not present

## 2022-06-06 DIAGNOSIS — Z91130 Patient's unintentional underdosing of medication regimen due to age-related debility: Secondary | ICD-10-CM | POA: Diagnosis not present

## 2022-06-06 DIAGNOSIS — Z9181 History of falling: Secondary | ICD-10-CM | POA: Diagnosis not present

## 2022-06-06 DIAGNOSIS — Z87891 Personal history of nicotine dependence: Secondary | ICD-10-CM | POA: Diagnosis not present

## 2022-06-06 DIAGNOSIS — D631 Anemia in chronic kidney disease: Secondary | ICD-10-CM | POA: Diagnosis not present

## 2022-06-06 DIAGNOSIS — E1122 Type 2 diabetes mellitus with diabetic chronic kidney disease: Secondary | ICD-10-CM | POA: Diagnosis not present

## 2022-06-06 DIAGNOSIS — E039 Hypothyroidism, unspecified: Secondary | ICD-10-CM | POA: Diagnosis not present

## 2022-06-06 DIAGNOSIS — H3530 Unspecified macular degeneration: Secondary | ICD-10-CM | POA: Diagnosis not present

## 2022-06-06 DIAGNOSIS — F32A Depression, unspecified: Secondary | ICD-10-CM | POA: Diagnosis not present

## 2022-06-06 DIAGNOSIS — I129 Hypertensive chronic kidney disease with stage 1 through stage 4 chronic kidney disease, or unspecified chronic kidney disease: Secondary | ICD-10-CM | POA: Diagnosis not present

## 2022-06-11 DIAGNOSIS — I129 Hypertensive chronic kidney disease with stage 1 through stage 4 chronic kidney disease, or unspecified chronic kidney disease: Secondary | ICD-10-CM | POA: Diagnosis not present

## 2022-06-11 DIAGNOSIS — G3183 Dementia with Lewy bodies: Secondary | ICD-10-CM | POA: Diagnosis not present

## 2022-06-11 DIAGNOSIS — F0283 Dementia in other diseases classified elsewhere, unspecified severity, with mood disturbance: Secondary | ICD-10-CM | POA: Diagnosis not present

## 2022-06-11 DIAGNOSIS — N1832 Chronic kidney disease, stage 3b: Secondary | ICD-10-CM | POA: Diagnosis not present

## 2022-06-11 DIAGNOSIS — E1122 Type 2 diabetes mellitus with diabetic chronic kidney disease: Secondary | ICD-10-CM | POA: Diagnosis not present

## 2022-06-11 DIAGNOSIS — F32A Depression, unspecified: Secondary | ICD-10-CM | POA: Diagnosis not present

## 2022-06-18 DIAGNOSIS — F0283 Dementia in other diseases classified elsewhere, unspecified severity, with mood disturbance: Secondary | ICD-10-CM | POA: Diagnosis not present

## 2022-06-18 DIAGNOSIS — E1122 Type 2 diabetes mellitus with diabetic chronic kidney disease: Secondary | ICD-10-CM | POA: Diagnosis not present

## 2022-06-18 DIAGNOSIS — G3183 Dementia with Lewy bodies: Secondary | ICD-10-CM | POA: Diagnosis not present

## 2022-06-18 DIAGNOSIS — F32A Depression, unspecified: Secondary | ICD-10-CM | POA: Diagnosis not present

## 2022-06-18 DIAGNOSIS — N1832 Chronic kidney disease, stage 3b: Secondary | ICD-10-CM | POA: Diagnosis not present

## 2022-06-18 DIAGNOSIS — I129 Hypertensive chronic kidney disease with stage 1 through stage 4 chronic kidney disease, or unspecified chronic kidney disease: Secondary | ICD-10-CM | POA: Diagnosis not present

## 2022-06-20 DIAGNOSIS — I129 Hypertensive chronic kidney disease with stage 1 through stage 4 chronic kidney disease, or unspecified chronic kidney disease: Secondary | ICD-10-CM | POA: Diagnosis not present

## 2022-06-20 DIAGNOSIS — G3183 Dementia with Lewy bodies: Secondary | ICD-10-CM | POA: Diagnosis not present

## 2022-06-20 DIAGNOSIS — F0283 Dementia in other diseases classified elsewhere, unspecified severity, with mood disturbance: Secondary | ICD-10-CM | POA: Diagnosis not present

## 2022-06-20 DIAGNOSIS — F32A Depression, unspecified: Secondary | ICD-10-CM | POA: Diagnosis not present

## 2022-06-20 DIAGNOSIS — N1832 Chronic kidney disease, stage 3b: Secondary | ICD-10-CM | POA: Diagnosis not present

## 2022-06-20 DIAGNOSIS — E1122 Type 2 diabetes mellitus with diabetic chronic kidney disease: Secondary | ICD-10-CM | POA: Diagnosis not present

## 2022-06-21 ENCOUNTER — Other Ambulatory Visit: Payer: Self-pay | Admitting: Adult Health

## 2022-06-21 DIAGNOSIS — I10 Essential (primary) hypertension: Secondary | ICD-10-CM

## 2022-06-25 DIAGNOSIS — N1832 Chronic kidney disease, stage 3b: Secondary | ICD-10-CM | POA: Diagnosis not present

## 2022-06-25 DIAGNOSIS — I129 Hypertensive chronic kidney disease with stage 1 through stage 4 chronic kidney disease, or unspecified chronic kidney disease: Secondary | ICD-10-CM | POA: Diagnosis not present

## 2022-06-25 DIAGNOSIS — E1122 Type 2 diabetes mellitus with diabetic chronic kidney disease: Secondary | ICD-10-CM | POA: Diagnosis not present

## 2022-06-25 DIAGNOSIS — F0283 Dementia in other diseases classified elsewhere, unspecified severity, with mood disturbance: Secondary | ICD-10-CM | POA: Diagnosis not present

## 2022-06-25 DIAGNOSIS — G3183 Dementia with Lewy bodies: Secondary | ICD-10-CM | POA: Diagnosis not present

## 2022-06-25 DIAGNOSIS — F32A Depression, unspecified: Secondary | ICD-10-CM | POA: Diagnosis not present

## 2022-06-28 DIAGNOSIS — F0283 Dementia in other diseases classified elsewhere, unspecified severity, with mood disturbance: Secondary | ICD-10-CM | POA: Diagnosis not present

## 2022-06-28 DIAGNOSIS — N1832 Chronic kidney disease, stage 3b: Secondary | ICD-10-CM | POA: Diagnosis not present

## 2022-06-28 DIAGNOSIS — F32A Depression, unspecified: Secondary | ICD-10-CM | POA: Diagnosis not present

## 2022-06-28 DIAGNOSIS — G3183 Dementia with Lewy bodies: Secondary | ICD-10-CM | POA: Diagnosis not present

## 2022-06-28 DIAGNOSIS — E1122 Type 2 diabetes mellitus with diabetic chronic kidney disease: Secondary | ICD-10-CM | POA: Diagnosis not present

## 2022-06-28 DIAGNOSIS — I129 Hypertensive chronic kidney disease with stage 1 through stage 4 chronic kidney disease, or unspecified chronic kidney disease: Secondary | ICD-10-CM | POA: Diagnosis not present

## 2022-06-30 DIAGNOSIS — F0283 Dementia in other diseases classified elsewhere, unspecified severity, with mood disturbance: Secondary | ICD-10-CM | POA: Diagnosis not present

## 2022-06-30 DIAGNOSIS — I129 Hypertensive chronic kidney disease with stage 1 through stage 4 chronic kidney disease, or unspecified chronic kidney disease: Secondary | ICD-10-CM | POA: Diagnosis not present

## 2022-06-30 DIAGNOSIS — F32A Depression, unspecified: Secondary | ICD-10-CM | POA: Diagnosis not present

## 2022-06-30 DIAGNOSIS — N1832 Chronic kidney disease, stage 3b: Secondary | ICD-10-CM | POA: Diagnosis not present

## 2022-06-30 DIAGNOSIS — G3183 Dementia with Lewy bodies: Secondary | ICD-10-CM | POA: Diagnosis not present

## 2022-06-30 DIAGNOSIS — E1122 Type 2 diabetes mellitus with diabetic chronic kidney disease: Secondary | ICD-10-CM | POA: Diagnosis not present

## 2022-07-01 DIAGNOSIS — F0283 Dementia in other diseases classified elsewhere, unspecified severity, with mood disturbance: Secondary | ICD-10-CM | POA: Diagnosis not present

## 2022-07-01 DIAGNOSIS — F32A Depression, unspecified: Secondary | ICD-10-CM | POA: Diagnosis not present

## 2022-07-01 DIAGNOSIS — G3183 Dementia with Lewy bodies: Secondary | ICD-10-CM | POA: Diagnosis not present

## 2022-07-01 DIAGNOSIS — I129 Hypertensive chronic kidney disease with stage 1 through stage 4 chronic kidney disease, or unspecified chronic kidney disease: Secondary | ICD-10-CM | POA: Diagnosis not present

## 2022-07-01 DIAGNOSIS — N1832 Chronic kidney disease, stage 3b: Secondary | ICD-10-CM | POA: Diagnosis not present

## 2022-07-01 DIAGNOSIS — E1122 Type 2 diabetes mellitus with diabetic chronic kidney disease: Secondary | ICD-10-CM | POA: Diagnosis not present

## 2022-08-26 ENCOUNTER — Encounter: Payer: Self-pay | Admitting: Neurology

## 2022-09-17 ENCOUNTER — Other Ambulatory Visit: Payer: Self-pay | Admitting: Neurology

## 2022-09-22 ENCOUNTER — Encounter: Payer: Self-pay | Admitting: Family Medicine

## 2022-09-22 ENCOUNTER — Ambulatory Visit (INDEPENDENT_AMBULATORY_CARE_PROVIDER_SITE_OTHER): Payer: Medicare Other | Admitting: Family Medicine

## 2022-09-22 ENCOUNTER — Ambulatory Visit: Payer: Medicare Other | Admitting: Adult Health

## 2022-09-22 VITALS — BP 122/58 | HR 66 | Temp 98.2°F | Wt 162.8 lb

## 2022-09-22 DIAGNOSIS — R350 Frequency of micturition: Secondary | ICD-10-CM

## 2022-09-22 DIAGNOSIS — R1310 Dysphagia, unspecified: Secondary | ICD-10-CM

## 2022-09-22 DIAGNOSIS — R197 Diarrhea, unspecified: Secondary | ICD-10-CM

## 2022-09-22 LAB — POCT URINALYSIS DIPSTICK
Bilirubin, UA: NEGATIVE
Blood, UA: NEGATIVE
Glucose, UA: NEGATIVE
Ketones, UA: NEGATIVE
Leukocytes, UA: NEGATIVE
Nitrite, UA: NEGATIVE
Protein, UA: POSITIVE — AB
Spec Grav, UA: 1.01 (ref 1.010–1.025)
Urobilinogen, UA: 0.2 E.U./dL
pH, UA: 5.5 (ref 5.0–8.0)

## 2022-09-22 NOTE — Progress Notes (Signed)
   Subjective:    Patient ID: [name], female    DOB: 1929-02-23, 89 y.o.   MRN: [id number]  HPI Here with her son for several issues. She saw her PCP, Dorothyann Peng, on 05-13-23 when she seemed to be at her baseline. She has Lewy Body dementia which makes history taking a bit difficult. That day labs were drawn showing a Hgb of 10.6 (stable for her) and a creatinine of 1.45 (stable for her). About 10 days ago she began to have diarrhea that has persisted. No fever. Her appetite is normal. She denies any nausea but she has trouble swallowing. Food often stops part way done and gags her, though she has not vomited. She complains of generalized abdominal pain. No recent medication changes. Her UA today is clear.    Review of Systems  Constitutional:  Negative for chills, diaphoresis and fever.  HENT: Negative.    Eyes: Negative.   Respiratory: Negative.    Cardiovascular: Negative.   Gastrointestinal:  Positive for abdominal pain and diarrhea. Negative for abdominal distention, blood in stool, constipation, nausea and vomiting.  Genitourinary:  Negative for difficulty urinating, flank pain, frequency, hematuria and urgency.  Neurological:  Positive for weakness. Negative for dizziness, tremors, seizures, syncope, facial asymmetry, speech difficulty and headaches.       Objective:   Physical Exam Constitutional:      Comments: In a wheelchair   Cardiovascular:     Rate and Rhythm: Normal rate and regular rhythm.     Pulses: Normal pulses.     Heart sounds: Normal heart sounds.  Pulmonary:     Effort: Pulmonary effort is normal.     Breath sounds: Normal breath sounds.  Abdominal:     General: Abdomen is flat. Bowel sounds are normal. There is no distension.     Palpations: Abdomen is soft. There is no mass.     Tenderness: There is no guarding or rebound.     Hernia: No hernia is present.     Comments: Mild generalized tenderness   Lymphadenopathy:     Cervical: No cervical  adenopathy.  Neurological:     Mental Status: She is alert.           Assessment & Plan:  The etiology of her diarrhea is not clear, but likely to be viral. I encouraged her to drink plenty of fluids. They can use Imodium as needed for the diarrhea. We will send her to the lab for CBC, C diff assay, etc. For the dysphagia, we will refer her to see Dr. Watt Climes. We spent a total of ( 35  ) minutes reviewing records and discussing these issues.  Alysia Penna, MD

## 2022-09-22 NOTE — Addendum Note (Signed)
Addended by: Wyvonne Lenz on: 09/22/2022 04:49 PM   Modules accepted: Orders

## 2022-09-23 ENCOUNTER — Other Ambulatory Visit: Payer: Medicare Other

## 2022-09-23 DIAGNOSIS — R197 Diarrhea, unspecified: Secondary | ICD-10-CM

## 2022-09-23 LAB — CBC WITH DIFFERENTIAL/PLATELET
Basophils Absolute: 0.1 10*3/uL (ref 0.0–0.1)
Basophils Relative: 1.4 % (ref 0.0–3.0)
Eosinophils Absolute: 0.2 10*3/uL (ref 0.0–0.7)
Eosinophils Relative: 2.5 % (ref 0.0–5.0)
HCT: 31.6 % — ABNORMAL LOW (ref 36.0–46.0)
Hemoglobin: 10.9 g/dL — ABNORMAL LOW (ref 12.0–15.0)
Lymphocytes Relative: 27 % (ref 12.0–46.0)
Lymphs Abs: 2 10*3/uL (ref 0.7–4.0)
MCHC: 34.3 g/dL (ref 30.0–36.0)
MCV: 93 fl (ref 78.0–100.0)
Monocytes Absolute: 0.6 10*3/uL (ref 0.1–1.0)
Monocytes Relative: 8.4 % (ref 3.0–12.0)
Neutro Abs: 4.4 10*3/uL (ref 1.4–7.7)
Neutrophils Relative %: 60.7 % (ref 43.0–77.0)
Platelets: 316 10*3/uL (ref 150.0–400.0)
RBC: 3.4 Mil/uL — ABNORMAL LOW (ref 3.87–5.11)
RDW: 13.6 % (ref 11.5–15.5)
WBC: 7.3 10*3/uL (ref 4.0–10.5)

## 2022-09-23 LAB — BASIC METABOLIC PANEL
BUN: 22 mg/dL (ref 6–23)
CO2: 23 mEq/L (ref 19–32)
Calcium: 9.4 mg/dL (ref 8.4–10.5)
Chloride: 102 mEq/L (ref 96–112)
Creatinine, Ser: 1.46 mg/dL — ABNORMAL HIGH (ref 0.40–1.20)
GFR: 31.31 mL/min — ABNORMAL LOW (ref >60.00)
Glucose, Bld: 87 mg/dL (ref 70–99)
Potassium: 5.3 mEq/L — ABNORMAL HIGH (ref 3.5–5.1)
Sodium: 134 mEq/L — ABNORMAL LOW (ref 135–145)

## 2022-09-23 LAB — HEPATIC FUNCTION PANEL
ALT: 14 U/L (ref 0–35)
AST: 17 U/L (ref 0–37)
Albumin: 4.4 g/dL (ref 3.5–5.2)
Alkaline Phosphatase: 82 U/L (ref 39–117)
Bilirubin, Direct: 0.1 mg/dL (ref 0.0–0.3)
Total Bilirubin: 0.3 mg/dL (ref 0.2–1.2)
Total Protein: 7.2 g/dL (ref 6.0–8.3)

## 2022-09-23 LAB — LIPASE: Lipase: 31 U/L (ref 11.0–59.0)

## 2022-09-24 LAB — CLOSTRIDIUM DIFFICILE BY PCR

## 2022-10-08 ENCOUNTER — Other Ambulatory Visit: Payer: Self-pay | Admitting: Gastroenterology

## 2022-10-08 DIAGNOSIS — R1311 Dysphagia, oral phase: Secondary | ICD-10-CM | POA: Diagnosis not present

## 2022-10-08 DIAGNOSIS — A09 Infectious gastroenteritis and colitis, unspecified: Secondary | ICD-10-CM | POA: Diagnosis not present

## 2022-10-08 DIAGNOSIS — R109 Unspecified abdominal pain: Secondary | ICD-10-CM | POA: Diagnosis not present

## 2022-11-02 ENCOUNTER — Ambulatory Visit
Admission: RE | Admit: 2022-11-02 | Discharge: 2022-11-02 | Disposition: A | Discharge status: Home / Self Care | Payer: Medicare Other | Source: Ambulatory Visit | Attending: Gastroenterology | Admitting: Gastroenterology

## 2022-11-02 DIAGNOSIS — R1311 Dysphagia, oral phase: Secondary | ICD-10-CM

## 2022-11-02 DIAGNOSIS — R1310 Dysphagia, unspecified: Secondary | ICD-10-CM | POA: Diagnosis not present

## 2022-11-10 ENCOUNTER — Ambulatory Visit: Payer: Medicare Other | Admitting: Neurology

## 2022-11-12 DIAGNOSIS — M533 Sacrococcygeal disorders, not elsewhere classified: Secondary | ICD-10-CM | POA: Diagnosis not present

## 2022-11-25 ENCOUNTER — Ambulatory Visit (INDEPENDENT_AMBULATORY_CARE_PROVIDER_SITE_OTHER): Payer: Medicare Other | Admitting: Neurology

## 2022-11-25 ENCOUNTER — Encounter: Payer: Self-pay | Admitting: Neurology

## 2022-11-25 ENCOUNTER — Telehealth: Payer: Self-pay

## 2022-11-25 VITALS — BP 167/76 | HR 60 | Ht 62.0 in | Wt 163.2 lb

## 2022-11-25 DIAGNOSIS — F02818 Dementia in other diseases classified elsewhere, unspecified severity, with other behavioral disturbance: Secondary | ICD-10-CM | POA: Diagnosis not present

## 2022-11-25 DIAGNOSIS — F4321 Adjustment disorder with depressed mood: Secondary | ICD-10-CM | POA: Diagnosis not present

## 2022-11-25 DIAGNOSIS — G3183 Dementia with Lewy bodies: Secondary | ICD-10-CM | POA: Diagnosis not present

## 2022-11-25 MED ORDER — REXULTI 0.5 MG PO TABS: ORAL_TABLET | ORAL | 6 refills | Status: DC

## 2022-11-25 NOTE — Telephone Encounter (Signed)
Noted  
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
## 2022-11-25 NOTE — Patient Instructions (Signed)
I wish you all the best.  Start Rexulti 0.5mg : take 1 tablet every night  2. Continue all your other medications  3. Follow-up in 8 months, call for any changes
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
## 2022-12-04 ENCOUNTER — Telehealth: Payer: Self-pay | Admitting: Neurology

## 2022-12-04 MED ORDER — REXULTI 0.5 MG PO TABS: ORAL_TABLET | ORAL | 1 refills | Status: DC

## 2022-12-04 NOTE — Telephone Encounter (Signed)
New message   1. Which medications need to be refilled? (please list name of each medication and dose if known) Brexpiprazole (REXULTI) 0.5 MG TABS   2. Which pharmacy/location (including street and city if local pharmacy) is medication to be sent to?[facility] - [place], [place] - [address]   3. Do they need a 30 day or 90 day supply? 90 day supply

## 2022-12-04 NOTE — Telephone Encounter (Signed)
[facility]ill [facility]

## 2022-12-06 ENCOUNTER — Other Ambulatory Visit: Payer: Self-pay | Admitting: Neurology

## 2022-12-09 ENCOUNTER — Emergency Department (HOSPITAL_COMMUNITY): Payer: Medicare Other

## 2022-12-09 ENCOUNTER — Encounter (HOSPITAL_COMMUNITY): Payer: Self-pay

## 2022-12-09 ENCOUNTER — Observation Stay (HOSPITAL_COMMUNITY)
Admission: EM | Admit: 2022-12-09 | Discharge: 2022-12-11 | Disposition: A | Discharge status: Skilled Nursing Facility | Payer: Medicare Other | Attending: Internal Medicine | Admitting: Internal Medicine

## 2022-12-09 DIAGNOSIS — Y92015 Private garage of single-family (private) house as the place of occurrence of the external cause: Secondary | ICD-10-CM | POA: Insufficient documentation

## 2022-12-09 DIAGNOSIS — S0990XA Unspecified injury of head, initial encounter: Secondary | ICD-10-CM | POA: Diagnosis not present

## 2022-12-09 DIAGNOSIS — R2689 Other abnormalities of gait and mobility: Secondary | ICD-10-CM | POA: Diagnosis not present

## 2022-12-09 DIAGNOSIS — T1490XA Injury, unspecified, initial encounter: Secondary | ICD-10-CM | POA: Diagnosis not present

## 2022-12-09 DIAGNOSIS — E039 Hypothyroidism, unspecified: Secondary | ICD-10-CM | POA: Insufficient documentation

## 2022-12-09 DIAGNOSIS — M7989 Other specified soft tissue disorders: Secondary | ICD-10-CM | POA: Diagnosis not present

## 2022-12-09 DIAGNOSIS — R2681 Unsteadiness on feet: Secondary | ICD-10-CM | POA: Insufficient documentation

## 2022-12-09 DIAGNOSIS — Z23 Encounter for immunization: Secondary | ICD-10-CM | POA: Insufficient documentation

## 2022-12-09 DIAGNOSIS — R8271 Bacteriuria: Secondary | ICD-10-CM | POA: Insufficient documentation

## 2022-12-09 DIAGNOSIS — D631 Anemia in chronic kidney disease: Secondary | ICD-10-CM | POA: Diagnosis not present

## 2022-12-09 DIAGNOSIS — W19XXXA Unspecified fall, initial encounter: Secondary | ICD-10-CM | POA: Diagnosis not present

## 2022-12-09 DIAGNOSIS — Z043 Encounter for examination and observation following other accident: Secondary | ICD-10-CM | POA: Diagnosis not present

## 2022-12-09 DIAGNOSIS — R296 Repeated falls: Secondary | ICD-10-CM | POA: Insufficient documentation

## 2022-12-09 DIAGNOSIS — E871 Hypo-osmolality and hyponatremia: Secondary | ICD-10-CM | POA: Diagnosis not present

## 2022-12-09 DIAGNOSIS — Y9301 Activity, walking, marching and hiking: Secondary | ICD-10-CM | POA: Insufficient documentation

## 2022-12-09 DIAGNOSIS — F0390 Unspecified dementia without behavioral disturbance: Secondary | ICD-10-CM | POA: Diagnosis not present

## 2022-12-09 DIAGNOSIS — M79632 Pain in left forearm: Secondary | ICD-10-CM | POA: Diagnosis not present

## 2022-12-09 DIAGNOSIS — I10 Essential (primary) hypertension: Secondary | ICD-10-CM | POA: Diagnosis not present

## 2022-12-09 DIAGNOSIS — R001 Bradycardia, unspecified: Secondary | ICD-10-CM | POA: Diagnosis not present

## 2022-12-09 DIAGNOSIS — W109XXA Fall (on) (from) unspecified stairs and steps, initial encounter: Secondary | ICD-10-CM | POA: Diagnosis not present

## 2022-12-09 DIAGNOSIS — Z79899 Other long term (current) drug therapy: Secondary | ICD-10-CM | POA: Insufficient documentation

## 2022-12-09 DIAGNOSIS — I129 Hypertensive chronic kidney disease with stage 1 through stage 4 chronic kidney disease, or unspecified chronic kidney disease: Secondary | ICD-10-CM | POA: Insufficient documentation

## 2022-12-09 DIAGNOSIS — N3000 Acute cystitis without hematuria: Secondary | ICD-10-CM | POA: Diagnosis not present

## 2022-12-09 DIAGNOSIS — S0181XA Laceration without foreign body of other part of head, initial encounter: Principal | ICD-10-CM

## 2022-12-09 DIAGNOSIS — Z87891 Personal history of nicotine dependence: Secondary | ICD-10-CM | POA: Insufficient documentation

## 2022-12-09 DIAGNOSIS — N1832 Chronic kidney disease, stage 3b: Secondary | ICD-10-CM | POA: Diagnosis not present

## 2022-12-09 DIAGNOSIS — R58 Hemorrhage, not elsewhere classified: Secondary | ICD-10-CM | POA: Diagnosis not present

## 2022-12-09 DIAGNOSIS — K5730 Diverticulosis of large intestine without perforation or abscess without bleeding: Secondary | ICD-10-CM | POA: Diagnosis not present

## 2022-12-09 DIAGNOSIS — R918 Other nonspecific abnormal finding of lung field: Secondary | ICD-10-CM | POA: Diagnosis not present

## 2022-12-09 DIAGNOSIS — M6281 Muscle weakness (generalized): Secondary | ICD-10-CM | POA: Insufficient documentation

## 2022-12-09 DIAGNOSIS — R519 Headache, unspecified: Secondary | ICD-10-CM | POA: Diagnosis present

## 2022-12-09 DIAGNOSIS — R600 Localized edema: Secondary | ICD-10-CM | POA: Diagnosis not present

## 2022-12-09 DIAGNOSIS — R220 Localized swelling, mass and lump, head: Secondary | ICD-10-CM | POA: Diagnosis not present

## 2022-12-09 LAB — BASIC METABOLIC PANEL
Anion gap: 9 (ref 5–15)
BUN: 17 mg/dL (ref 8–23)
CO2: 21 mmol/L — ABNORMAL LOW (ref 22–32)
Calcium: 8.9 mg/dL (ref 8.9–10.3)
Chloride: 103 mmol/L (ref 98–111)
Creatinine, Ser: 1.51 mg/dL — ABNORMAL HIGH (ref 0.44–1.00)
GFR, Estimated: 32 mL/min — ABNORMAL LOW (ref >60)
Glucose, Bld: 127 mg/dL — ABNORMAL HIGH (ref 70–99)
Potassium: 4.7 mmol/L (ref 3.5–5.1)
Sodium: 133 mmol/L — ABNORMAL LOW (ref 135–145)

## 2022-12-09 LAB — CBC
HCT: 29.2 % — ABNORMAL LOW (ref 36.0–46.0)
Hemoglobin: 9.8 g/dL — ABNORMAL LOW (ref 12.0–15.0)
MCH: 31.7 pg (ref 26.0–34.0)
MCHC: 33.6 g/dL (ref 30.0–36.0)
MCV: 94.5 fL (ref 80.0–100.0)
Platelets: 271 10*3/uL (ref 150–400)
RBC: 3.09 MIL/uL — ABNORMAL LOW (ref 3.87–5.11)
RDW: 12.9 % (ref 11.5–15.5)
WBC: 12.2 10*3/uL — ABNORMAL HIGH (ref 4.0–10.5)
nRBC: 0 % (ref 0.0–0.2)

## 2022-12-09 LAB — URINALYSIS, ROUTINE W REFLEX MICROSCOPIC
Bacteria, UA: NONE SEEN
Bilirubin Urine: NEGATIVE
Glucose, UA: NEGATIVE mg/dL
Hgb urine dipstick: NEGATIVE
Ketones, ur: NEGATIVE mg/dL
Nitrite: NEGATIVE
Protein, ur: 100 mg/dL — AB
Specific Gravity, Urine: 1.011 (ref 1.005–1.030)
pH: 5 (ref 5.0–8.0)

## 2022-12-09 LAB — CBG MONITORING, ED: Glucose-Capillary: 115 mg/dL — ABNORMAL HIGH (ref 70–99)

## 2022-12-09 MED ORDER — LIDOCAINE-EPINEPHRINE (PF) 2 %-1:200000 IJ SOLN
10.0000 mL | Freq: Once | INTRAMUSCULAR | Status: AC
  Administered 2022-12-09: 10 mL via INTRADERMAL
  Filled 2022-12-09: qty 20

## 2022-12-09 MED ORDER — SODIUM CHLORIDE 0.9 % IV SOLN
1.0000 g | Freq: Once | INTRAVENOUS | Status: AC
  Administered 2022-12-09: 1 g via INTRAVENOUS
  Filled 2022-12-09: qty 10

## 2022-12-09 MED ORDER — TETANUS-DIPHTH-ACELL PERTUSSIS 5-2.5-18.5 LF-MCG/0.5 IM SUSY
0.5000 mL | PREFILLED_SYRINGE | Freq: Once | INTRAMUSCULAR | Status: AC
  Administered 2022-12-09: 0.5 mL via INTRAMUSCULAR
  Filled 2022-12-09: qty 0.5

## 2022-12-09 MED ORDER — SODIUM CHLORIDE 0.9 % IV BOLUS
500.0000 mL | Freq: Once | INTRAVENOUS | Status: AC
  Administered 2022-12-10: 500 mL via INTRAVENOUS

## 2022-12-09 MED ORDER — LIDOCAINE-EPINEPHRINE-TETRACAINE (LET) TOPICAL GEL
3.0000 mL | Freq: Once | TOPICAL | Status: AC
  Administered 2022-12-09: 3 mL via TOPICAL
  Filled 2022-12-09: qty 3

## 2022-12-09 MED ORDER — MORPHINE SULFATE (PF) 4 MG/ML IV SOLN
4.0000 mg | Freq: Once | INTRAVENOUS | Status: AC
  Administered 2022-12-09: 4 mg via INTRAVENOUS
  Filled 2022-12-09: qty 1

## 2022-12-09 NOTE — ED Triage Notes (Signed)
BIB EMS/ pt fell going down a step down into garage landing on concrete floor / pt injured right side of forehead/ bleeding controlled/ right elbow skin tear/ bil knee pain/ left upper forearm skin tear/ small lac to nose/ bil hip pain/ denies LOC/ denies thinners

## 2022-12-09 NOTE — ED Notes (Signed)
Pt in CT.

## 2022-12-09 NOTE — ED Notes (Signed)
Placed pt on bed pan.

## 2022-12-09 NOTE — ED Provider Notes (Signed)
[place] [facility] AT [facility] Provider Note   CSN: [id number] Arrival date & time: 12/09/22  1955     History {Add pertinent medical, surgical, social history, OB history to HPI:1} Chief Complaint  Patient presents with   [name] is a 88 y.o. female.  The history is provided by the patient, medical records and a friend. No language interpreter was used.  Fall     89 year old female significant history of hypertension, chronic kidney disease, Lewy body dementia, anemia, chronic fatigue, hypothyroidism who was brought here via EMS from home for evaluation of a fall.  Patient reports today she was walking out from her kitchen to her garage when she fell down several steps.  She is unsure what caused her to fall but this is an area that is easy to fall.  She did hit her head but denies any loss of consciousness.  She was unable to get up on her own and did try to call for help.  She was on the ground for approximately an hour before she was able to get helped.  She is now complaining of pain to her face and head and neck as well as pain to her upper and lower extremities.  She does not endorse any chest pain abdominal pain no dysuria no heart palpitation no precipitating symptoms prior to the fall.  She is not on any blood thinner medication.  She denies any recent sickness.  Home Medications Prior to Admission medications   Medication Sig Start Date End Date Taking? Authorizing Provider  amLODipine (NORVASC) 10 MG tablet TAKE 1 TABLET BY MOUTH DAILY 06/23/22   Nafziger, Kandee Keen, NP  Brexpiprazole (REXULTI) 0.5 MG TABS Take 1 tablet every night 12/04/22   Van Clines, MD  diphenhydramine-acetaminophen (TYLENOL PM) 25-500 MG TABS tablet Take 1 tablet by mouth at bedtime as needed (for pain/sleep).    [provider]  donepezil (ARICEPT) 10 MG tablet Take 1 tablet daily Patient taking differently: Take 10 mg by mouth at bedtime. Take 1 tablet  daily 04/15/22   Van Clines, MD  levothyroxine (SYNTHROID) 150 MCG tablet TAKE 1 TABLET BY MOUTH DAILY 06/23/22   Nafziger, Kandee Keen, NP  memantine (NAMENDA) 10 MG tablet TAKE 1 TABLET BY MOUTH IN THE  MORNING AND AT BEDTIME 12/07/22   Van Clines, MD  Multiple Vitamins-Minerals (ONE-A-DAY WOMENS 50+) TABS Take 1 tablet by mouth daily with breakfast.    [provider]  Multiple Vitamins-Minerals (PRESERVISION AREDS 2 PO) Take 1 capsule by mouth daily with breakfast.    [provider]  olmesartan (BENICAR) 20 MG tablet TAKE 1 TABLET BY MOUTH DAILY 06/23/22   Nafziger, Kandee Keen, NP  omeprazole (PRILOSEC) 40 MG capsule TAKE 1 CAPSULE BY MOUTH DAILY Patient taking differently: in the morning and at bedtime. 06/23/22   Nafziger, Kandee Keen, NP  QUEtiapine (SEROQUEL) 50 MG tablet Take 1 tablet twice a day Patient taking differently: Take 50 mg by mouth in the morning and at bedtime. 04/15/22   Van Clines, MD  sertraline (ZOLOFT) 100 MG tablet Take 1 tablet (100 mg total) by mouth daily. Patient taking differently: Take 100 mg by mouth in the morning and at bedtime. 04/15/22   Van Clines, MD  SYSTANE ULTRA PF 0.4-0.3 % SOLN Place 1 drop into both eyes 3 (three) times daily as needed (for dryness).    [provider]  TYLENOL 500 MG tablet Take 500 mg  by mouth every 6 (six) hours as needed for mild pain or headache.    [provider]      Allergies    Oxycodone hcl    Review of Systems   Review of Systems  All other systems reviewed and are negative.   Physical Exam Updated Vital Signs BP (!) 113/93 (BP Location: Right Arm)   Pulse (!) 58   Temp 98.2 F (36.8 C) (Oral)   Resp 17   Wt 75 kg   SpO2 97%   BMI 30.24 kg/m  Physical Exam Vitals and nursing note reviewed.  Constitutional:      Appearance: Normal appearance.  HENT:     Head:     Comments: A large 9cm v-shape laceration noted to right temporal region with dressing in place.  It is tender  to palpation but no crepitus noted.  Abrasion noted to the right side of face and the chin. Tenderness to nose with No septal hematoma, no malocclusion and no significant midface tenderness. Skin tear to chin. Patient has some faint ecchymosis to her right periorbital region.  Extraocular movement is intact. Eyes:     Extraocular Movements: Extraocular movements intact.     Pupils: Pupils are equal, round, and reactive to light.  Neck:     Comments: Cervical collar in place.  Patient does have some tenderness to cervical paraspinal muscle without significant midline spine tenderness. Cardiovascular:     Rate and Rhythm: Regular rhythm.     Pulses: Normal pulses.     Heart sounds: Normal heart sounds.  Pulmonary:     Effort: Pulmonary effort is normal.     Breath sounds: Normal breath sounds.  Chest:     Chest wall: No tenderness.  Abdominal:     Palpations: Abdomen is soft.     Tenderness: There is no abdominal tenderness.  Musculoskeletal:        General: Tenderness present.     Comments: Right arm: Skin tear noted to posterior right elbow with tenderness to palpation.  Able to flex and extend elbow.  Bruising noted to right wrist and hand with tenderness to palpation but no deformity.  Left arm: Skin tear noted to left mid forearm  Right leg: Tenderness to mid anterior tib-fib with bruising noted.  No significant right knee tenderness or right hip tenderness  Left leg: Tenderness to anterior mid tib-fib region without significant knee tenderness or ankle tenderness.  Able to plantarflexion and dorsiflexion.  Able to flex and extend bilateral knees.  No significant hip or pelvis tenderness no shortness of legs.  Tenderness along thoracic midline spine without any crepitus.  Neurological:     Mental Status: She is alert.     Comments: Alert and oriented x 2, no obvious focal neurodeficit.     ED Results / Procedures / Treatments   Labs (all labs ordered are listed, but only  abnormal results are displayed) Labs Reviewed  BASIC METABOLIC PANEL - Abnormal; Notable for the following components:      Result Value   Sodium 133 (*)    CO2 21 (*)    Glucose, Bld 127 (*)    Creatinine, Ser 1.51 (*)    GFR, Estimated 32 (*)    All other components within normal limits  CBC - Abnormal; Notable for the following components:   WBC 12.2 (*)    RBC 3.09 (*)    Hemoglobin 9.8 (*)    HCT 29.2 (*)    All other  components within normal limits  URINALYSIS, ROUTINE W REFLEX MICROSCOPIC - Abnormal; Notable for the following components:   APPearance HAZY (*)    Protein, ur 100 (*)    Leukocytes,Ua MODERATE (*)    All other components within normal limits  CBG MONITORING, ED - Abnormal; Notable for the following components:   Glucose-Capillary 115 (*)    All other components within normal limits  CK    EKG EKG Interpretation  Date/Time:  Wednesday Dec 09 2022 20:06:02 EDT Ventricular Rate:  59 PR Interval:  206 QRS Duration: 98 QT Interval:  430 QTC Calculation: 426 R Axis:   -3 Text Interpretation: Sinus rhythm Nonspecific T abnormalities, lateral leads Minimal ST elevation, lateral leads Confirmed by Kristine Royal [phone]) on 12/09/2022 9:42:33 PM  Radiology DG Wrist Complete Right  Result Date: 12/09/2022 CLINICAL DATA:  Fall EXAM: RIGHT WRIST - COMPLETE 3+ VIEW COMPARISON:  None Available. FINDINGS: No fracture or malalignment. Mild degenerative change at the first [facility] joint. Soft tissue swelling greatest adjacent to the ulna. IMPRESSION: No acute osseous abnormality. Soft tissue swelling. Electronically Signed   By: Jasmine Pang M.D.   On: 12/09/2022 21:34   DG Tibia/Fibula Right  Result Date: 12/09/2022 CLINICAL DATA:  Fall EXAM: RIGHT TIBIA AND FIBULA - 2 VIEW COMPARISON:  12/17/2016 FINDINGS: No fracture or malalignment. Vascular calcifications. Soft tissue swelling. IMPRESSION: No acute osseous abnormality. Electronically Signed   By: Jasmine Pang M.D.    On: 12/09/2022 21:33   DG Tibia/Fibula Left  Result Date: 12/09/2022 CLINICAL DATA:  Fall EXAM: LEFT TIBIA AND FIBULA - 2 VIEW COMPARISON:  11/14/2010, 11/10/2010 FINDINGS: Surgical plate and fixating screws in the proximal tibia. No acute displaced fracture or malalignment. Generalized soft tissue edema. Old tibial plateau fractures IMPRESSION: Postsurgical changes of the proximal tibia with old tibial plateau fractures. No acute osseous abnormality. Electronically Signed   By: Jasmine Pang M.D.   On: 12/09/2022 21:32   DG Elbow 2 Views Right  Result Date: 12/09/2022 CLINICAL DATA:  Fall EXAM: RIGHT ELBOW - 2 VIEW COMPARISON:  None Available. FINDINGS: There is no evidence of fracture, dislocation, or joint effusion. There is no evidence of arthropathy or other focal bone abnormality. Soft tissues are unremarkable. IMPRESSION: Negative. Electronically Signed   By: Darliss Cheney M.D.   On: 12/09/2022 21:30    Procedures Procedures  {Document cardiac monitor, telemetry assessment procedure when appropriate:1}  Medications Ordered in ED Medications  sodium chloride 0.9 % bolus 500 mL (has no administration in time range)  Tdap (BOOSTRIX) injection 0.5 mL (0.5 mLs Intramuscular Given 12/09/22 2138)  morphine (PF) 4 MG/ML injection 4 mg (4 mg Intravenous Given 12/09/22 2131)  cefTRIAXone (ROCEPHIN) 1 g in sodium chloride 0.9 % 100 mL IVPB (1 g Intravenous New Bag/Given 12/09/22 2321)  lidocaine-EPINEPHrine-tetracaine (LET) topical gel (3 mLs Topical Given 12/09/22 2324)  lidocaine-EPINEPHrine (XYLOCAINE W/EPI) 2 %-1:200000 (PF) injection 10 mL (10 mLs Intradermal Given 12/09/22 2323)    ED Course/ Medical Decision Making/ A&P Clinical Course as of 12/09/22 2356  Wed Dec 09, 2022  2326 Hx of dementia -  Larey Seat today no LOC, on ground for 1 hour. Bruised all over.  Lives by herself. Pain control and gentle hydration.  [WF]    Clinical Course User Index [WF] Gailen Shelter, PA   {   Click here for  ABCD2, HEART and other calculatorsREFRESH Note before signing :1}  Medical Decision Making Amount and/or Complexity of Data Reviewed Labs: ordered. Radiology: ordered.  Risk Prescription drug management.   BP (!) 113/93 (BP Location: Right Arm)   Pulse (!) 58   Temp 98.2 F (36.8 C) (Oral)   Resp 17   Wt 75 kg   SpO2 97%   BMI 30.24 kg/m   25:16 PM 89 year old female significant history of hypertension, chronic kidney disease, Lewy body dementia, anemia, chronic fatigue, hypothyroidism who was brought here via EMS from home for evaluation of a fall.  Patient reports today she was walking out from her kitchen to her garage when she fell down several steps.  She is unsure what caused her to fall but this is an area that is easy to fall.  She did hit her head but denies any loss of consciousness.  She was unable to get up on her own and did try to call for help.  She was on the ground for approximately an hour before she was able to get helped.  She is now complaining of pain to her face and head and neck as well as pain to her upper and lower extremities.  She does not endorse any chest pain abdominal pain no dysuria no heart palpitation no precipitating symptoms prior to the fall.  She is not on any blood thinner medication.  She denies any recent sickness.  On exam, elderly female laying in bed appears uncomfortable.  She is wearing a c-collar.  She has skin laceration noted to her right forehead and right side of face with dressing in place.  C-collar is in place but she does have some tenderness about her midline cervical and thoracic spine without any crepitus or step-off.  No tenderness to her chest wall or abdominal wall.  She does have tenderness to her right elbow, right wrist/hand with associated ecchymosis and skin tear noted.  She has skin tears to her left forearm.  She has bruising to mid tib-fib bilaterally with tenderness to palpation.  She is able to flex  and extend by of the knees.  She has sensation throughout.  She does have some thoracic midline spine tenderness.  She is alert and oriented x 2.  -Labs ordered, independently viewed and interpreted by me.  Labs remarkable for UA showing moderate and WBC 21-50 suggestive of UTI, will start rocephin. Cr 1.51, similar to prior.  WBC 12.2, hgb 9.8 similar to prior.   -The patient was maintained on a cardiac monitor.  I personally viewed and interpreted the cardiac monitored which showed an underlying rhythm of: sinus bradycardia -Imaging including CT of head/maxillofacial/cspine/thoracic/chest/abd/pelvis are pending.  Xrays of R elbow, R wrist, bilateral tib/fib are negative. Xray of L forearm is pending -This patient presents to the ED for concern of fall, this involves an extensive number of treatment options, and is a complaint that carries with it a high risk of complications and morbidity.  The differential diagnosis includes mechanical fall, fx, dislocation, laceration, internal injury, uti -Co morbidities that complicate the patient evaluation includes dementia, htn, ckd,  -Treatment includes *** -Reevaluation of the patient after these medicines showed that the patient {resolved/improved/worsened:23923::"improved"} -PCP office notes or outside notes reviewed -Discussion with specialist *** -Escalation to admission/observation considered: patients feels much better, is comfortable with discharge, and will follow up with PCP -Prescription medication considered, patient comfortable with *** -Social Determinant of Health considered which includes ***   {Document critical care time when appropriate:1} {Document review of labs and clinical decision  tools ie heart score, Chads2Vasc2 etc:1}  {Document your independent review of radiology images, and any outside records:1} {Document your discussion with family members, caretakers, and with consultants:1} {Document social determinants of health  affecting pt's care:1} {Document your decision making why or why not admission, treatments were needed:1} Final Clinical Impression(s) / ED Diagnoses Final diagnoses:  None    Rx / DC Orders ED Discharge Orders     None

## 2022-12-10 ENCOUNTER — Emergency Department (HOSPITAL_COMMUNITY): Payer: Medicare Other

## 2022-12-10 DIAGNOSIS — W19XXXD Unspecified fall, subsequent encounter: Secondary | ICD-10-CM

## 2022-12-10 DIAGNOSIS — I10 Essential (primary) hypertension: Secondary | ICD-10-CM

## 2022-12-10 DIAGNOSIS — M79632 Pain in left forearm: Secondary | ICD-10-CM | POA: Diagnosis not present

## 2022-12-10 DIAGNOSIS — S0181XA Laceration without foreign body of other part of head, initial encounter: Secondary | ICD-10-CM | POA: Diagnosis not present

## 2022-12-10 DIAGNOSIS — W19XXXA Unspecified fall, initial encounter: Secondary | ICD-10-CM | POA: Diagnosis not present

## 2022-12-10 LAB — BASIC METABOLIC PANEL
Anion gap: 8 (ref 5–15)
BUN: 18 mg/dL (ref 8–23)
CO2: 22 mmol/L (ref 22–32)
Calcium: 8.4 mg/dL — ABNORMAL LOW (ref 8.9–10.3)
Chloride: 102 mmol/L (ref 98–111)
Creatinine, Ser: 1.47 mg/dL — ABNORMAL HIGH (ref 0.44–1.00)
GFR, Estimated: 33 mL/min — ABNORMAL LOW (ref >60)
Glucose, Bld: 130 mg/dL — ABNORMAL HIGH (ref 70–99)
Potassium: 4.6 mmol/L (ref 3.5–5.1)
Sodium: 132 mmol/L — ABNORMAL LOW (ref 135–145)

## 2022-12-10 LAB — CBC
HCT: 24.8 % — ABNORMAL LOW (ref 36.0–46.0)
Hemoglobin: 8.3 g/dL — ABNORMAL LOW (ref 12.0–15.0)
MCH: 31.6 pg (ref 26.0–34.0)
MCHC: 33.5 g/dL (ref 30.0–36.0)
MCV: 94.3 fL (ref 80.0–100.0)
Platelets: 224 10*3/uL (ref 150–400)
RBC: 2.63 MIL/uL — ABNORMAL LOW (ref 3.87–5.11)
RDW: 12.9 % (ref 11.5–15.5)
WBC: 8.6 10*3/uL (ref 4.0–10.5)
nRBC: 0 % (ref 0.0–0.2)

## 2022-12-10 LAB — TSH: TSH: 5.128 u[IU]/mL — ABNORMAL HIGH (ref 0.350–4.500)

## 2022-12-10 LAB — PHOSPHORUS: Phosphorus: 3.6 mg/dL (ref 2.5–4.6)

## 2022-12-10 LAB — MAGNESIUM: Magnesium: 2 mg/dL (ref 1.7–2.4)

## 2022-12-10 LAB — CK: Total CK: 67 U/L (ref 38–234)

## 2022-12-10 MED ORDER — DOCUSATE SODIUM 100 MG PO CAPS
100.0000 mg | ORAL_CAPSULE | Freq: Two times a day (BID) | ORAL | Status: DC
  Administered 2022-12-10 – 2022-12-11 (×3): 100 mg via ORAL
  Filled 2022-12-10 (×4): qty 1

## 2022-12-10 MED ORDER — IRBESARTAN 75 MG PO TABS
37.5000 mg | ORAL_TABLET | Freq: Every day | ORAL | Status: DC
  Administered 2022-12-10 – 2022-12-11 (×2): 37.5 mg via ORAL
  Filled 2022-12-10 (×2): qty 1
  Filled 2022-12-10 (×2): qty 0.5

## 2022-12-10 MED ORDER — PANTOPRAZOLE SODIUM 40 MG PO TBEC
40.0000 mg | DELAYED_RELEASE_TABLET | Freq: Every day | ORAL | Status: DC
  Administered 2022-12-10 – 2022-12-11 (×2): 40 mg via ORAL
  Filled 2022-12-10 (×2): qty 1

## 2022-12-10 MED ORDER — TRAZODONE HCL 50 MG PO TABS
25.0000 mg | ORAL_TABLET | Freq: Every evening | ORAL | Status: DC | PRN
  Administered 2022-12-10 (×2): 25 mg via ORAL
  Filled 2022-12-10 (×2): qty 1

## 2022-12-10 MED ORDER — PROCHLORPERAZINE EDISYLATE 10 MG/2ML IJ SOLN
5.0000 mg | Freq: Four times a day (QID) | INTRAMUSCULAR | Status: DC | PRN
  Administered 2022-12-10: 5 mg via INTRAVENOUS
  Filled 2022-12-10: qty 2

## 2022-12-10 MED ORDER — HYDRALAZINE HCL 20 MG/ML IJ SOLN
5.0000 mg | Freq: Four times a day (QID) | INTRAMUSCULAR | Status: DC | PRN
  Administered 2022-12-10: 5 mg via INTRAVENOUS
  Filled 2022-12-10: qty 1

## 2022-12-10 MED ORDER — ACETAMINOPHEN 500 MG PO TABS
1000.0000 mg | ORAL_TABLET | ORAL | Status: AC
  Administered 2022-12-10: 1000 mg via ORAL
  Filled 2022-12-10: qty 2

## 2022-12-10 MED ORDER — AMLODIPINE BESYLATE 10 MG PO TABS
10.0000 mg | ORAL_TABLET | Freq: Every day | ORAL | Status: DC
  Administered 2022-12-10 – 2022-12-11 (×2): 10 mg via ORAL
  Filled 2022-12-10 (×2): qty 1

## 2022-12-10 MED ORDER — LEVOTHYROXINE SODIUM 75 MCG PO TABS
150.0000 ug | ORAL_TABLET | Freq: Every day | ORAL | Status: DC
  Administered 2022-12-10 – 2022-12-11 (×2): 150 ug via ORAL
  Filled 2022-12-10 (×2): qty 2

## 2022-12-10 MED ORDER — ACETAMINOPHEN 325 MG PO TABS
650.0000 mg | ORAL_TABLET | Freq: Four times a day (QID) | ORAL | Status: DC | PRN
  Administered 2022-12-11: 650 mg via ORAL
  Filled 2022-12-10 (×2): qty 2

## 2022-12-10 MED ORDER — MORPHINE SULFATE (PF) 4 MG/ML IV SOLN
4.0000 mg | Freq: Once | INTRAVENOUS | Status: AC
  Administered 2022-12-10: 4 mg via INTRAVENOUS
  Filled 2022-12-10: qty 1

## 2022-12-10 MED ORDER — POLYVINYL ALCOHOL 1.4 % OP SOLN: 1.0000 [drp] | Freq: Three times a day (TID) | OPHTHALMIC | Status: DC | PRN

## 2022-12-10 MED ORDER — QUETIAPINE FUMARATE 50 MG PO TABS
50.0000 mg | ORAL_TABLET | Freq: Two times a day (BID) | ORAL | Status: DC
  Administered 2022-12-10 – 2022-12-11 (×4): 50 mg via ORAL
  Filled 2022-12-10: qty 1
  Filled 2022-12-10: qty 2
  Filled 2022-12-10 (×2): qty 1

## 2022-12-10 MED ORDER — MEMANTINE HCL 10 MG PO TABS
10.0000 mg | ORAL_TABLET | Freq: Every day | ORAL | Status: DC
  Administered 2022-12-10 – 2022-12-11 (×2): 10 mg via ORAL
  Filled 2022-12-10 (×2): qty 1

## 2022-12-10 MED ORDER — SODIUM CHLORIDE 0.9 % IV SOLN: 2.0000 g | INTRAVENOUS | Status: DC

## 2022-12-10 MED ORDER — ACETAMINOPHEN 650 MG RE SUPP: 650.0000 mg | Freq: Four times a day (QID) | RECTAL | Status: DC | PRN

## 2022-12-10 MED ORDER — DONEPEZIL HCL 10 MG PO TABS
10.0000 mg | ORAL_TABLET | Freq: Every day | ORAL | Status: DC
  Administered 2022-12-10: 10 mg via ORAL
  Filled 2022-12-10: qty 1

## 2022-12-10 MED ORDER — SERTRALINE HCL 100 MG PO TABS
100.0000 mg | ORAL_TABLET | Freq: Every day | ORAL | Status: DC
  Administered 2022-12-10 – 2022-12-11 (×2): 100 mg via ORAL
  Filled 2022-12-10 (×2): qty 1

## 2022-12-10 MED ORDER — ACETAMINOPHEN 325 MG PO TABS
650.0000 mg | ORAL_TABLET | Freq: Four times a day (QID) | ORAL | Status: DC | PRN
  Administered 2022-12-10: 650 mg via ORAL
  Filled 2022-12-10: qty 2

## 2022-12-10 MED ORDER — ADULT MULTIVITAMIN W/MINERALS CH
1.0000 | ORAL_TABLET | Freq: Every day | ORAL | Status: DC
  Administered 2022-12-10: 1 via ORAL
  Filled 2022-12-10 (×2): qty 1

## 2022-12-10 MED ORDER — PROSIGHT PO TABS
1.0000 | ORAL_TABLET | Freq: Every day | ORAL | Status: DC
  Administered 2022-12-10 – 2022-12-11 (×2): 1 via ORAL
  Filled 2022-12-10 (×3): qty 1

## 2022-12-10 MED ORDER — ENOXAPARIN SODIUM 30 MG/0.3ML IJ SOSY
30.0000 mg | PREFILLED_SYRINGE | INTRAMUSCULAR | Status: DC
  Administered 2022-12-10: 30 mg via SUBCUTANEOUS
  Filled 2022-12-10: qty 0.3

## 2022-12-10 MED ORDER — POLYETHYLENE GLYCOL 3350 17 G PO PACK: 17.0000 g | PACK | Freq: Every day | ORAL | Status: DC | PRN

## 2022-12-10 NOTE — Progress Notes (Signed)
  Transition of Care (TOC) Screening Note   Patient Details  Name: [name] Date of Birth: [birth year]   Transition of Care [facility]) CM/SW Contact:    Darrold Span, RN Phone Number: 12/10/2022, 2:22 PM    Transition of Care Department [facility]) has reviewed patient and note pt from home alone s/p fall down stairs- PT/OT evals pending- likely will need SNF.  We will continue to monitor patient advancement through interdisciplinary progression rounds. If new patient transition needs arise, please place a TOC consult.

## 2022-12-10 NOTE — Progress Notes (Addendum)
PROGRESS NOTE   [name]  NWG:[id number]    DOB: July 11, 1929    DOA: 12/09/2022  PCP: Shirline Frees, NP   I have briefly reviewed patients previous medical records in [facility].  Chief Complaint  Patient presents with   Fall    Brief Narrative:  89 year old female, claims to live alone and uses a walker to ambulate, medical history significant for dementia, hypothyroidism, hypertension, hyperlipidemia, stage IIIb CKD, anemia of chronic disease who presented to the ED on 12/09/2022 from home after an unwitnessed fall.  Extensive imaging negative for fractures.  Asymptomatic pyuria.  Awaiting PT and OT evaluation, may likely need SNF for STR, TOC consulted for SNF, medically ready for DC to SNF pending therapy eval and SNF bed.   Assessment & Plan:  Principal Problem:   Fall   Unwitnessed mechanical fall at home: Patient reports that this is not her first fall.  Specifically denies LOC.  No fractures on extensive imaging.  Did have right facial laceration which has been sutured and multiple facial bruises, bilateral leg bruises.  Currently without pain.  Likely due to very advanced age, physical frailty/deconditioning.  Low index of suspicion for UTI.  No rhabdomyolysis.  PT, OT evaluation and TOC for possible SNF consulted.  Fall precautions.  Multimodality pain control but minimize opioids.  Delirium precautions.  Asymptomatic bacteriuria: Patient specifically denies dysuria, urinary frequency, urgency or suprapubic abdominal pain.  UA only shows pyuria without nitrites or bacteria.  Leukocytosis may be reactive.  Low index of suspicion for UTI and hence discontinued empirically started ceftriaxone.  Stage IIIb chronic kidney disease: Stable with creatinine at baseline.  Hyponatremia, mild POA.  Not clinically relevant to current admission.  Acute on chronic anemia of chronic disease: Baseline hemoglobin may be in the 9-10 g range.  Hemoglobin dropped from 9.8-8.3, may be  due to acute blood loss and hemodilution.  Follow CBC in AM.  Dementia without behavioral abnormalities: Suspect mild or may be even moderate dementia.  Delirium precautions.  Continue Aricept and Namenda.  Also on Seroquel and sertraline, continue.  Hypothyroidism: Continue levothyroxine.  Clinically euthyroid.  No recent TSH, will check.  Hypertension Continue amlodipine and irbesartan.  Mildly uncontrolled.  Added as needed IV hydralazine low-dose.   Chronic anxiety/depression/chronic insomnia Continue home regimen.   GERD Continue home PPI.  Body mass index is 30.12 kg/m./Obesity   DVT prophylaxis: enoxaparin (LOVENOX) injection 30 mg Start: 12/10/22 2200 SCDs Start: 12/10/22 5784     Code Status: Full Code:  ACP Documents: None present. Family Communication: Left VM message for son. Disposition:  Lacks medical intensity for inpatient despite care that may cross 2 midnights.  Communicated with [facility] RN and okay to change to observation and code 44 if that is what is recommended by the physician advisor.     Consultants:     Procedures:     Antimicrobials:      Subjective:  History as noted above.  Currently denies pain, dysuria, urinary frequency, urgency or abdominal pain.  Not very keen on going to SNF for STR but says that if she has to, she will.  As per discussion with RN, no acute issues noted.  As per RN update: son called her and and said she is more confused here than at home, she does live alone but he cooks, he is agreeable to rehab   Objective:   Vitals:   12/10/22 0400 12/10/22 0415 12/10/22 0426 12/10/22 0502  BP: (!) 163/150 Marland Kitchen)  160/85  (!) 183/51  Pulse: (!) 58 (!) 59  (!) 55  Resp: 14 17  14   Temp:   98.2 F (36.8 C) 98.7 F (37.1 C)  TempSrc:   Oral Oral  SpO2: 99% 96%  96%  Weight:    74.7 kg    General exam: Very elderly female, moderately built and frail, lying comfortably propped up in bed without distress.  Oral mucosa  moist. HEENT: Large laceration on the right temporal region, sutured.  Right periorbital ecchymosis but not significant swelling.  No subconjunctival hemorrhage.  Pupils equally reacting to light and accommodation.  Multiple bruises on the face.  Left frontal ecchymosis.  Dressing over the chin. Respiratory system: Clear to auscultation. Respiratory effort normal. Cardiovascular system: S1 & S2 heard, regular bradycardia.Marland Kitchen No JVD, murmurs, rubs, gallops or clicks. No pedal edema.  Telemetry personally reviewed: Sinus bradycardia in the 50s.  No pauses or arrhythmias.  Discontinue telemetry. Gastrointestinal system: Abdomen is nondistended, soft and nontender. No organomegaly or masses felt. Normal bowel sounds heard.  RUQ extensive healed scars from possible prior open cholecystectomy.?  Midline suprapubic surgical scar. Central nervous system: Alert and oriented to person, place and partly to time (May). No focal neurological deficits. Extremities: Symmetric 5 x 5 power.  Dressing over bilateral forearms.  Bilateral leg/shin bruises, more so on the right. Skin: No rashes, lesions or ulcers Psychiatry: Judgement and insight appear somewhat impaired. Mood & affect appropriate.     Data Reviewed:   I have personally reviewed following labs and imaging studies   CBC: Recent Labs  Lab 12/09/22 2139 12/10/22 0252  WBC 12.2* 8.6  HGB 9.8* 8.3*  HCT 29.2* 24.8*  MCV 94.5 94.3  PLT 271 224    Basic Metabolic Panel: Recent Labs  Lab 12/09/22 2139 12/10/22 0252  NA 133* 132*  K 4.7 4.6  CL 103 102  CO2 21* 22  GLUCOSE 127* 130*  BUN 17 18  CREATININE 1.51* 1.47*  CALCIUM 8.9 8.4*  MG  --  2.0  PHOS  --  3.6    Liver Function Tests: No results for input(s): "AST", "ALT", "ALKPHOS", "BILITOT", "PROT", "ALBUMIN" in the last 168 hours.  CBG: Recent Labs  Lab 12/09/22 2148  GLUCAP 115*    Microbiology Studies:  No results found for this or any previous visit (from the past  240 hour(s)).  Radiology Studies:  CT HEAD WO CONTRAST  Addendum Date: 12/10/2022   ADDENDUM REPORT: 12/10/2022 01:54 ADDENDUM: The interval cervical fusion, new from prior studies was actually at C6-7, not at C5-6. Electronically Signed   By: Almira Bar M.D.   On: 12/10/2022 01:54   Result Date: 12/10/2022 CLINICAL DATA:  89 year old with blunt polytrauma, unspecified trauma mechanism. EXAM: CT HEAD WITHOUT CONTRAST CT MAXILLOFACIAL WITHOUT CONTRAST CT CERVICAL SPINE WITHOUT CONTRAST CT CHEST, ABDOMEN AND PELVIS WITHOUT CONTRAST TECHNIQUE: Contiguous axial images were obtained from the base of the skull through the vertex without intravenous contrast. Multidetector CT imaging of the maxillofacial structures was performed. Multiplanar CT image reconstructions were also generated. A small metallic BB was placed on the right temple in order to reliably differentiate right from left. Multidetector CT imaging of the cervical spine was performed without intravenous contrast. Multiplanar CT image reconstructions were also generated. Multidetector CT imaging of the chest, abdomen and pelvis was performed without administration of intravenous contrast. RADIATION DOSE REDUCTION: This exam was performed according to the departmental dose-optimization program which includes automated exposure control, adjustment of the  mA and/or kV according to patient size and/or use of iterative reconstruction technique. COMPARISON:  CT scan head 05/05/2022, MRI cervical spine 04/13/2010, CT abdomen pelvis with contrast 02/14/2016. Chest PA and lateral 12/17/2016 FINDINGS: CT HEAD FINDINGS Brain: There is mild global atrophy for age, mild atrophic ventriculomegaly and small-vessel disease. No infarct, hemorrhage or mass are seen. There is no midline shift. Basal cisterns are clear. Vascular: There are calcifications in the siphons, distal vertebral and basilar arteries. No hyperdense central vessels. Skull: No fracture or focal  lesions are seen. There is left lateral high parietal scalp swelling. Other: None. CT MAXILLOFACIAL FINDINGS Osseous: The facial study is motion compromised. There is no obvious displaced fracture but a subtle fracture would be missed. No mandibular dislocation. Orbits: No traumatic or inflammatory finding is seen through the motion artifacts. There are old lens replacements. Sinuses: No significant sinus disease is seen through the motion artifacts. The mastoid air cells are clear. The ostiomeatal complexes are patent. Nasal septum is midline. Soft tissues: There is a skin laceration in the lateral right periorbital area with mild swelling. The epiglottis and tongue base are normal. CT CERVICAL SPINE FINDINGS Alignment: There is a chronic trace degenerative anterolisthesis at C5-6, slight levoscoliosis, and again noted bone-on-bone anterior atlantodental joint space loss with osteophytes and sclerosis of the dens. There is no new or acute traumatic malalignment. Skull base and vertebrae: Old anterior plate fusion Z6-1 with solid arthrodesis is again noted. There has been interval C5-6 anterior plate fusion with solid arthrodesis at this level as well with the facet joints remaining patent. There is osteopenia with no evidence of fractures or primary pathologic process. There are small endplate Schmorl's nodes at C2-3. Soft tissues and spinal canal: No prevertebral fluid or swelling. No visible canal hematoma. There is no laryngeal mass. There is heavy calcification in the proximal cervical ICAs. There is advanced fatty replacement of the parotid glands, moderate fatty replacement of the submandibular gland. Disc levels: Partial disc space loss noted C2-3 and C3-4. Normal disc height at C5-6 and C7-T1. Fused disc spaces C4-5 and C6-7. Metal artifact from the fusion hardware does limit evaluation of the spinal canal but there is no obvious herniated disc or cord compromise. There is trace marginal endplate ridging at  C5-6 but no significant disc osteophyte complex. Facet joint and uncinate spurring is seen at most levels, with foraminal stenosis which is bilaterally severe C3-4, moderate on the right and mild on the left at C4-5, moderate on the right at C5-6. Remaining foramina are clear. Other: None. CT CHEST FINDINGS Cardiovascular: The cardiac size is normal. The coronary arteries are heavily calcified. There is no pericardial effusion. There is mild tortuosity and moderate to heavy calcification in the thoracic aorta, patchy calcific plaques in the great vessels. There is no aortic aneurysm. The pulmonary arteries and veins are normal in caliber. Mediastinum/Nodes: Thyroid gland is atrophic, small in size without mass. No axillary or intrathoracic adenopathy are seen. Small hiatal hernia. There are calcified precarinal lymph nodes to the right. Lungs/Pleura: Calcified granuloma right upper lobe. Reticulated scarring both lung apices symmetrically. The lungs are otherwise clear. There is no pleural effusion, thickening or pneumothorax. Mild chronic elevation right hemidiaphragm. Musculoskeletal: There is osteopenia with degenerative changes of the first spine, mild kyphodextroscoliosis. No spinal compression fractures are seen. The ribs, sternum and visualized shoulder girdles show no displaced fractures. CT ABDOMEN AND PELVIS FINDINGS Hepatobiliary: Chronic postcholecystectomy intrahepatic and extrahepatic bile duct dilatation. The common bile duct is  stable in caliber 16.2 mm. No ductal filling defect is seen. No focal abnormality in the unenhanced liver Pancreas: No abnormality is seen without contrast. Spleen: No abnormality is seen without contrast. A 1.3 cm calcified splenic artery aneurysm is again noted along the lower splenic hilum. Adrenals/Urinary Tract: Chronic perinephric stranding appears similar. There is no adrenal mass. Unremarkable unenhanced renal cortex again noted. There are no urinary stones or  obstructive uropathy. There is no bladder thickening. Stomach/Bowel: No dilatation or wall thickening. An appendix is not seen. There is diffuse colonic diverticulosis most advanced in the sigmoid segment but no evidence of focal colitis or diverticulitis. Vascular/Lymphatic: Heavy aortic and branch vessel atherosclerosis. No AAA. No MEF. Reproductive: Status post hysterectomy. No adnexal masses. Other: Pelvic floor laxity is again seen but the prior demonstration of a cystocele is not noted today. There is no free air, free hemorrhage or free fluid. There are no incarcerated hernias. Scattered pelvic phleboliths. Musculoskeletal: There is osteopenia and degenerative change of the lumbar spine. Hemangiomatous replacement L2 vertebral body. There are chronic laminectomy defects at L3 and 4. No regional skeletal fracture is seen. L5 is transitional with bilateral fused sacral articulations. IMPRESSION: 1. No acute intracranial CT findings or depressed skull fractures. Left lateral high parietal scalp swelling. 2. Motion limited maxillofacial CT with no obvious displaced fractures. Facial laceration lateral right periorbital area. 3. Osteopenia and degenerative changes without evidence of cervical fractures. 4. Interval C5-6 anterior plate fusion with solid arthrodesis. Old C4-5 mature fusion. 5. No acute trauma related findings in the chest, abdomen or pelvis. 6. Carotid, aortic and coronary artery atherosclerosis. 7. Diverticulosis without diverticulitis. 8. Pelvic floor laxity without evidence of cystocele. 9. Chronic post cholecystectomy intrahepatic and extrahepatic biliary dilatation. Electronically Signed: By: Almira Bar M.D. On: 12/09/2022 23:26   CT MAXILLOFACIAL WO CONTRAST  Addendum Date: 12/10/2022   ADDENDUM REPORT: 12/10/2022 01:54 ADDENDUM: The interval cervical fusion, new from prior studies was actually at C6-7, not at C5-6. Electronically Signed   By: Almira Bar M.D.   On: 12/10/2022 01:54    Result Date: 12/10/2022 CLINICAL DATA:  89 year old with blunt polytrauma, unspecified trauma mechanism. EXAM: CT HEAD WITHOUT CONTRAST CT MAXILLOFACIAL WITHOUT CONTRAST CT CERVICAL SPINE WITHOUT CONTRAST CT CHEST, ABDOMEN AND PELVIS WITHOUT CONTRAST TECHNIQUE: Contiguous axial images were obtained from the base of the skull through the vertex without intravenous contrast. Multidetector CT imaging of the maxillofacial structures was performed. Multiplanar CT image reconstructions were also generated. A small metallic BB was placed on the right temple in order to reliably differentiate right from left. Multidetector CT imaging of the cervical spine was performed without intravenous contrast. Multiplanar CT image reconstructions were also generated. Multidetector CT imaging of the chest, abdomen and pelvis was performed without administration of intravenous contrast. RADIATION DOSE REDUCTION: This exam was performed according to the departmental dose-optimization program which includes automated exposure control, adjustment of the mA and/or kV according to patient size and/or use of iterative reconstruction technique. COMPARISON:  CT scan head 05/05/2022, MRI cervical spine 04/13/2010, CT abdomen pelvis with contrast 02/14/2016. Chest PA and lateral 12/17/2016 FINDINGS: CT HEAD FINDINGS Brain: There is mild global atrophy for age, mild atrophic ventriculomegaly and small-vessel disease. No infarct, hemorrhage or mass are seen. There is no midline shift. Basal cisterns are clear. Vascular: There are calcifications in the siphons, distal vertebral and basilar arteries. No hyperdense central vessels. Skull: No fracture or focal lesions are seen. There is left lateral high parietal scalp swelling. Other:  None. CT MAXILLOFACIAL FINDINGS Osseous: The facial study is motion compromised. There is no obvious displaced fracture but a subtle fracture would be missed. No mandibular dislocation. Orbits: No traumatic or  inflammatory finding is seen through the motion artifacts. There are old lens replacements. Sinuses: No significant sinus disease is seen through the motion artifacts. The mastoid air cells are clear. The ostiomeatal complexes are patent. Nasal septum is midline. Soft tissues: There is a skin laceration in the lateral right periorbital area with mild swelling. The epiglottis and tongue base are normal. CT CERVICAL SPINE FINDINGS Alignment: There is a chronic trace degenerative anterolisthesis at C5-6, slight levoscoliosis, and again noted bone-on-bone anterior atlantodental joint space loss with osteophytes and sclerosis of the dens. There is no new or acute traumatic malalignment. Skull base and vertebrae: Old anterior plate fusion Z6-1 with solid arthrodesis is again noted. There has been interval C5-6 anterior plate fusion with solid arthrodesis at this level as well with the facet joints remaining patent. There is osteopenia with no evidence of fractures or primary pathologic process. There are small endplate Schmorl's nodes at C2-3. Soft tissues and spinal canal: No prevertebral fluid or swelling. No visible canal hematoma. There is no laryngeal mass. There is heavy calcification in the proximal cervical ICAs. There is advanced fatty replacement of the parotid glands, moderate fatty replacement of the submandibular gland. Disc levels: Partial disc space loss noted C2-3 and C3-4. Normal disc height at C5-6 and C7-T1. Fused disc spaces C4-5 and C6-7. Metal artifact from the fusion hardware does limit evaluation of the spinal canal but there is no obvious herniated disc or cord compromise. There is trace marginal endplate ridging at C5-6 but no significant disc osteophyte complex. Facet joint and uncinate spurring is seen at most levels, with foraminal stenosis which is bilaterally severe C3-4, moderate on the right and mild on the left at C4-5, moderate on the right at C5-6. Remaining foramina are clear. Other:  None. CT CHEST FINDINGS Cardiovascular: The cardiac size is normal. The coronary arteries are heavily calcified. There is no pericardial effusion. There is mild tortuosity and moderate to heavy calcification in the thoracic aorta, patchy calcific plaques in the great vessels. There is no aortic aneurysm. The pulmonary arteries and veins are normal in caliber. Mediastinum/Nodes: Thyroid gland is atrophic, small in size without mass. No axillary or intrathoracic adenopathy are seen. Small hiatal hernia. There are calcified precarinal lymph nodes to the right. Lungs/Pleura: Calcified granuloma right upper lobe. Reticulated scarring both lung apices symmetrically. The lungs are otherwise clear. There is no pleural effusion, thickening or pneumothorax. Mild chronic elevation right hemidiaphragm. Musculoskeletal: There is osteopenia with degenerative changes of the first spine, mild kyphodextroscoliosis. No spinal compression fractures are seen. The ribs, sternum and visualized shoulder girdles show no displaced fractures. CT ABDOMEN AND PELVIS FINDINGS Hepatobiliary: Chronic postcholecystectomy intrahepatic and extrahepatic bile duct dilatation. The common bile duct is stable in caliber 16.2 mm. No ductal filling defect is seen. No focal abnormality in the unenhanced liver Pancreas: No abnormality is seen without contrast. Spleen: No abnormality is seen without contrast. A 1.3 cm calcified splenic artery aneurysm is again noted along the lower splenic hilum. Adrenals/Urinary Tract: Chronic perinephric stranding appears similar. There is no adrenal mass. Unremarkable unenhanced renal cortex again noted. There are no urinary stones or obstructive uropathy. There is no bladder thickening. Stomach/Bowel: No dilatation or wall thickening. An appendix is not seen. There is diffuse colonic diverticulosis most advanced in the sigmoid segment but  no evidence of focal colitis or diverticulitis. Vascular/Lymphatic: Heavy aortic and  branch vessel atherosclerosis. No AAA. No MEF. Reproductive: Status post hysterectomy. No adnexal masses. Other: Pelvic floor laxity is again seen but the prior demonstration of a cystocele is not noted today. There is no free air, free hemorrhage or free fluid. There are no incarcerated hernias. Scattered pelvic phleboliths. Musculoskeletal: There is osteopenia and degenerative change of the lumbar spine. Hemangiomatous replacement L2 vertebral body. There are chronic laminectomy defects at L3 and 4. No regional skeletal fracture is seen. L5 is transitional with bilateral fused sacral articulations. IMPRESSION: 1. No acute intracranial CT findings or depressed skull fractures. Left lateral high parietal scalp swelling. 2. Motion limited maxillofacial CT with no obvious displaced fractures. Facial laceration lateral right periorbital area. 3. Osteopenia and degenerative changes without evidence of cervical fractures. 4. Interval C5-6 anterior plate fusion with solid arthrodesis. Old C4-5 mature fusion. 5. No acute trauma related findings in the chest, abdomen or pelvis. 6. Carotid, aortic and coronary artery atherosclerosis. 7. Diverticulosis without diverticulitis. 8. Pelvic floor laxity without evidence of cystocele. 9. Chronic post cholecystectomy intrahepatic and extrahepatic biliary dilatation. Electronically Signed: By: Almira Bar M.D. On: 12/09/2022 23:26   CT CERVICAL SPINE WO CONTRAST  Addendum Date: 12/10/2022   ADDENDUM REPORT: 12/10/2022 01:54 ADDENDUM: The interval cervical fusion, new from prior studies was actually at C6-7, not at C5-6. Electronically Signed   By: Almira Bar M.D.   On: 12/10/2022 01:54   Result Date: 12/10/2022 CLINICAL DATA:  89 year old with blunt polytrauma, unspecified trauma mechanism. EXAM: CT HEAD WITHOUT CONTRAST CT MAXILLOFACIAL WITHOUT CONTRAST CT CERVICAL SPINE WITHOUT CONTRAST CT CHEST, ABDOMEN AND PELVIS WITHOUT CONTRAST TECHNIQUE: Contiguous axial images were  obtained from the base of the skull through the vertex without intravenous contrast. Multidetector CT imaging of the maxillofacial structures was performed. Multiplanar CT image reconstructions were also generated. A small metallic BB was placed on the right temple in order to reliably differentiate right from left. Multidetector CT imaging of the cervical spine was performed without intravenous contrast. Multiplanar CT image reconstructions were also generated. Multidetector CT imaging of the chest, abdomen and pelvis was performed without administration of intravenous contrast. RADIATION DOSE REDUCTION: This exam was performed according to the departmental dose-optimization program which includes automated exposure control, adjustment of the mA and/or kV according to patient size and/or use of iterative reconstruction technique. COMPARISON:  CT scan head 05/05/2022, MRI cervical spine 04/13/2010, CT abdomen pelvis with contrast 02/14/2016. Chest PA and lateral 12/17/2016 FINDINGS: CT HEAD FINDINGS Brain: There is mild global atrophy for age, mild atrophic ventriculomegaly and small-vessel disease. No infarct, hemorrhage or mass are seen. There is no midline shift. Basal cisterns are clear. Vascular: There are calcifications in the siphons, distal vertebral and basilar arteries. No hyperdense central vessels. Skull: No fracture or focal lesions are seen. There is left lateral high parietal scalp swelling. Other: None. CT MAXILLOFACIAL FINDINGS Osseous: The facial study is motion compromised. There is no obvious displaced fracture but a subtle fracture would be missed. No mandibular dislocation. Orbits: No traumatic or inflammatory finding is seen through the motion artifacts. There are old lens replacements. Sinuses: No significant sinus disease is seen through the motion artifacts. The mastoid air cells are clear. The ostiomeatal complexes are patent. Nasal septum is midline. Soft tissues: There is a skin  laceration in the lateral right periorbital area with mild swelling. The epiglottis and tongue base are normal. CT CERVICAL SPINE FINDINGS Alignment: There  is a chronic trace degenerative anterolisthesis at C5-6, slight levoscoliosis, and again noted bone-on-bone anterior atlantodental joint space loss with osteophytes and sclerosis of the dens. There is no new or acute traumatic malalignment. Skull base and vertebrae: Old anterior plate fusion U9-8 with solid arthrodesis is again noted. There has been interval C5-6 anterior plate fusion with solid arthrodesis at this level as well with the facet joints remaining patent. There is osteopenia with no evidence of fractures or primary pathologic process. There are small endplate Schmorl's nodes at C2-3. Soft tissues and spinal canal: No prevertebral fluid or swelling. No visible canal hematoma. There is no laryngeal mass. There is heavy calcification in the proximal cervical ICAs. There is advanced fatty replacement of the parotid glands, moderate fatty replacement of the submandibular gland. Disc levels: Partial disc space loss noted C2-3 and C3-4. Normal disc height at C5-6 and C7-T1. Fused disc spaces C4-5 and C6-7. Metal artifact from the fusion hardware does limit evaluation of the spinal canal but there is no obvious herniated disc or cord compromise. There is trace marginal endplate ridging at C5-6 but no significant disc osteophyte complex. Facet joint and uncinate spurring is seen at most levels, with foraminal stenosis which is bilaterally severe C3-4, moderate on the right and mild on the left at C4-5, moderate on the right at C5-6. Remaining foramina are clear. Other: None. CT CHEST FINDINGS Cardiovascular: The cardiac size is normal. The coronary arteries are heavily calcified. There is no pericardial effusion. There is mild tortuosity and moderate to heavy calcification in the thoracic aorta, patchy calcific plaques in the great vessels. There is no aortic  aneurysm. The pulmonary arteries and veins are normal in caliber. Mediastinum/Nodes: Thyroid gland is atrophic, small in size without mass. No axillary or intrathoracic adenopathy are seen. Small hiatal hernia. There are calcified precarinal lymph nodes to the right. Lungs/Pleura: Calcified granuloma right upper lobe. Reticulated scarring both lung apices symmetrically. The lungs are otherwise clear. There is no pleural effusion, thickening or pneumothorax. Mild chronic elevation right hemidiaphragm. Musculoskeletal: There is osteopenia with degenerative changes of the first spine, mild kyphodextroscoliosis. No spinal compression fractures are seen. The ribs, sternum and visualized shoulder girdles show no displaced fractures. CT ABDOMEN AND PELVIS FINDINGS Hepatobiliary: Chronic postcholecystectomy intrahepatic and extrahepatic bile duct dilatation. The common bile duct is stable in caliber 16.2 mm. No ductal filling defect is seen. No focal abnormality in the unenhanced liver Pancreas: No abnormality is seen without contrast. Spleen: No abnormality is seen without contrast. A 1.3 cm calcified splenic artery aneurysm is again noted along the lower splenic hilum. Adrenals/Urinary Tract: Chronic perinephric stranding appears similar. There is no adrenal mass. Unremarkable unenhanced renal cortex again noted. There are no urinary stones or obstructive uropathy. There is no bladder thickening. Stomach/Bowel: No dilatation or wall thickening. An appendix is not seen. There is diffuse colonic diverticulosis most advanced in the sigmoid segment but no evidence of focal colitis or diverticulitis. Vascular/Lymphatic: Heavy aortic and branch vessel atherosclerosis. No AAA. No MEF. Reproductive: Status post hysterectomy. No adnexal masses. Other: Pelvic floor laxity is again seen but the prior demonstration of a cystocele is not noted today. There is no free air, free hemorrhage or free fluid. There are no incarcerated  hernias. Scattered pelvic phleboliths. Musculoskeletal: There is osteopenia and degenerative change of the lumbar spine. Hemangiomatous replacement L2 vertebral body. There are chronic laminectomy defects at L3 and 4. No regional skeletal fracture is seen. 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 IMPRESSION: 1. No acute intracranial CT findings or depressed skull fractures. Left lateral high parietal scalp swelling. 2. Motion limited maxillofacial CT with no obvious displaced fractures. Facial laceration lateral right periorbital area. 3. Osteopenia and degenerative changes without evidence of cervical fractures. 4. Interval C5-6 anterior plate fusion with solid arthrodesis. Old C4-5 mature fusion. 5. No acute trauma related findings in the chest, abdomen or pelvis. 6. Carotid, aortic and coronary artery atherosclerosis. 7. Diverticulosis without diverticulitis. 8. Pelvic floor laxity without evidence of cystocele. 9. Chronic post cholecystectomy intrahepatic and extrahepatic biliary dilatation. Electronically Signed: By: Almira Bar M.D. On: 12/09/2022 23:26   CT CHEST ABDOMEN PELVIS WO CONTRAST  Addendum Date: 12/10/2022   ADDENDUM REPORT: 12/10/2022 01:54 ADDENDUM: The interval cervical fusion, new from prior studies was actually at C6-7, not at C5-6. Electronically Signed   By: Almira Bar M.D.   On: 12/10/2022 01:54   Result Date: 12/10/2022 CLINICAL DATA:  89 year old with blunt polytrauma, unspecified trauma mechanism. EXAM: CT HEAD WITHOUT CONTRAST CT MAXILLOFACIAL WITHOUT CONTRAST CT CERVICAL SPINE WITHOUT CONTRAST CT CHEST, ABDOMEN AND PELVIS WITHOUT CONTRAST TECHNIQUE: Contiguous axial images were obtained from the base of the skull through the vertex without intravenous contrast. Multidetector CT imaging of the maxillofacial structures was performed. Multiplanar CT image reconstructions were also generated. A small metallic BB was placed on the right temple in order to  reliably differentiate right from left. Multidetector CT imaging of the cervical spine was performed without intravenous contrast. Multiplanar CT image reconstructions were also generated. Multidetector CT imaging of the chest, abdomen and pelvis was performed without administration of intravenous contrast. RADIATION DOSE REDUCTION: This exam was performed according to the departmental dose-optimization program which includes automated exposure control, adjustment of the mA and/or kV according to patient size and/or use of iterative reconstruction technique. COMPARISON:  CT scan head 05/05/2022, MRI cervical spine 04/13/2010, CT abdomen pelvis with contrast 02/14/2016. Chest PA and lateral 12/17/2016 FINDINGS: CT HEAD FINDINGS Brain: There is mild global atrophy for age, mild atrophic ventriculomegaly and small-vessel disease. No infarct, hemorrhage or mass are seen. There is no midline shift. Basal cisterns are clear. Vascular: There are calcifications in the siphons, distal vertebral and basilar arteries. No hyperdense central vessels. Skull: No fracture or focal lesions are seen. There is left lateral high parietal scalp swelling. Other: None. CT MAXILLOFACIAL FINDINGS Osseous: The facial study is motion compromised. There is no obvious displaced fracture but a subtle fracture would be missed. No mandibular dislocation. Orbits: No traumatic or inflammatory finding is seen through the motion artifacts. There are old lens replacements. Sinuses: No significant sinus disease is seen through the motion artifacts. The mastoid air cells are clear. The ostiomeatal complexes are patent. Nasal septum is midline. Soft tissues: There is a skin laceration in the lateral right periorbital area with mild swelling. The epiglottis and tongue base are normal. CT CERVICAL SPINE FINDINGS Alignment: There is a chronic trace degenerative anterolisthesis at C5-6, slight levoscoliosis, and again noted bone-on-bone anterior atlantodental  joint space loss with osteophytes and sclerosis of the dens. There is no new or acute traumatic malalignment. Skull base and vertebrae: Old anterior plate fusion Z6-1 with solid arthrodesis is again noted. There has been interval C5-6 anterior plate fusion with solid arthrodesis at this level as well with the facet joints remaining patent. There is osteopenia with no evidence of fractures or primary pathologic process. There are small endplate Schmorl's nodes at C2-3. Soft tissues and spinal canal: No prevertebral fluid  or swelling. No visible canal hematoma. There is no laryngeal mass. There is heavy calcification in the proximal cervical ICAs. There is advanced fatty replacement of the parotid glands, moderate fatty replacement of the submandibular gland. Disc levels: Partial disc space loss noted C2-3 and C3-4. Normal disc height at C5-6 and C7-T1. Fused disc spaces C4-5 and C6-7. Metal artifact from the fusion hardware does limit evaluation of the spinal canal but there is no obvious herniated disc or cord compromise. There is trace marginal endplate ridging at C5-6 but no significant disc osteophyte complex. Facet joint and uncinate spurring is seen at most levels, with foraminal stenosis which is bilaterally severe C3-4, moderate on the right and mild on the left at C4-5, moderate on the right at C5-6. Remaining foramina are clear. Other: None. CT CHEST FINDINGS Cardiovascular: The cardiac size is normal. The coronary arteries are heavily calcified. There is no pericardial effusion. There is mild tortuosity and moderate to heavy calcification in the thoracic aorta, patchy calcific plaques in the great vessels. There is no aortic aneurysm. The pulmonary arteries and veins are normal in caliber. Mediastinum/Nodes: Thyroid gland is atrophic, small in size without mass. No axillary or intrathoracic adenopathy are seen. Small hiatal hernia. There are calcified precarinal lymph nodes to the right. Lungs/Pleura:  Calcified granuloma right upper lobe. Reticulated scarring both lung apices symmetrically. The lungs are otherwise clear. There is no pleural effusion, thickening or pneumothorax. Mild chronic elevation right hemidiaphragm. Musculoskeletal: There is osteopenia with degenerative changes of the first spine, mild kyphodextroscoliosis. No spinal compression fractures are seen. The ribs, sternum and visualized shoulder girdles show no displaced fractures. CT ABDOMEN AND PELVIS FINDINGS Hepatobiliary: Chronic postcholecystectomy intrahepatic and extrahepatic bile duct dilatation. The common bile duct is stable in caliber 16.2 mm. No ductal filling defect is seen. No focal abnormality in the unenhanced liver Pancreas: No abnormality is seen without contrast. Spleen: No abnormality is seen without contrast. A 1.3 cm calcified splenic artery aneurysm is again noted along the lower splenic hilum. Adrenals/Urinary Tract: Chronic perinephric stranding appears similar. There is no adrenal mass. Unremarkable unenhanced renal cortex again noted. There are no urinary stones or obstructive uropathy. There is no bladder thickening. Stomach/Bowel: No dilatation or wall thickening. An appendix is not seen. There is diffuse colonic diverticulosis most advanced in the sigmoid segment but no evidence of focal colitis or diverticulitis. Vascular/Lymphatic: Heavy aortic and branch vessel atherosclerosis. No AAA. No MEF. Reproductive: Status post hysterectomy. No adnexal masses. Other: Pelvic floor laxity is again seen but the prior demonstration of a cystocele is not noted today. There is no free air, free hemorrhage or free fluid. There are no incarcerated hernias. Scattered pelvic phleboliths. Musculoskeletal: There is osteopenia and degenerative change of the lumbar spine. Hemangiomatous replacement L2 vertebral body. There are chronic laminectomy defects at L3 and 4. No regional skeletal fracture is seen. L5 is transitional with  bilateral fused sacral articulations. IMPRESSION: 1. No acute intracranial CT findings or depressed skull fractures. Left lateral high parietal scalp swelling. 2. Motion limited maxillofacial CT with no obvious displaced fractures. Facial laceration lateral right periorbital area. 3. Osteopenia and degenerative changes without evidence of cervical fractures. 4. Interval C5-6 anterior plate fusion with solid arthrodesis. Old C4-5 mature fusion. 5. No acute trauma related findings in the chest, abdomen or pelvis. 6. Carotid, aortic and coronary artery atherosclerosis. 7. Diverticulosis without diverticulitis. 8. Pelvic floor laxity without evidence of cystocele. 9. Chronic post cholecystectomy intrahepatic and extrahepatic biliary dilatation. Electronically Signed:  By: Almira Bar M.D. On: 12/09/2022 23:26   DG Forearm Left  Result Date: 12/10/2022 CLINICAL DATA:  Fall forearm pain EXAM: LEFT FOREARM - 2 VIEW COMPARISON:  None Available. FINDINGS: No fracture or malalignment. Degenerative changes at the first [facility] joint. IMPRESSION: No acute osseous abnormality. Electronically Signed   By: Jasmine Pang M.D.   On: 12/10/2022 00:27   CT T-SPINE NO CHARGE  Result Date: 12/09/2022 CLINICAL DATA:  Trauma EXAM: CT Thoracic Spine without contrast TECHNIQUE: Multiplanar CT images of the thoracic spine were reconstructed from contemporary CT of the Chest. RADIATION DOSE REDUCTION: This exam was performed according to the departmental dose-optimization program which includes automated exposure control, adjustment of the mA and/or kV according to patient size and/or use of iterative reconstruction technique. CONTRAST:  None or No additional COMPARISON:  None Available. FINDINGS: Alignment: Thoracic alignment within normal limits. Vertebrae: Vertebral bodies demonstrate normal stature. No fracture. Paraspinal and other soft tissues: Negative. Disc levels: Partially visualized anterior fusion hardware in the lower  cervical spine. Multilevel degenerative osteophytes of the thoracic spine. No abnormal disc space widening. IMPRESSION: Degenerative changes of the thoracic spine. No acute osseous abnormality. Electronically Signed   By: Jasmine Pang M.D.   On: 12/09/2022 23:03   DG Wrist Complete Right  Result Date: 12/09/2022 CLINICAL DATA:  Fall EXAM: RIGHT WRIST - COMPLETE 3+ VIEW COMPARISON:  None Available. FINDINGS: No fracture or malalignment. Mild degenerative change at the first [facility] joint. Soft tissue swelling greatest adjacent to the ulna. IMPRESSION: No acute osseous abnormality. Soft tissue swelling. Electronically Signed   By: Jasmine Pang M.D.   On: 12/09/2022 21:34   DG Tibia/Fibula Right  Result Date: 12/09/2022 CLINICAL DATA:  Fall EXAM: RIGHT TIBIA AND FIBULA - 2 VIEW COMPARISON:  12/17/2016 FINDINGS: No fracture or malalignment. Vascular calcifications. Soft tissue swelling. IMPRESSION: No acute osseous abnormality. Electronically Signed   By: Jasmine Pang M.D.   On: 12/09/2022 21:33   DG Tibia/Fibula Left  Result Date: 12/09/2022 CLINICAL DATA:  Fall EXAM: LEFT TIBIA AND FIBULA - 2 VIEW COMPARISON:  11/14/2010, 11/10/2010 FINDINGS: Surgical plate and fixating screws in the proximal tibia. No acute displaced fracture or malalignment. Generalized soft tissue edema. Old tibial plateau fractures IMPRESSION: Postsurgical changes of the proximal tibia with old tibial plateau fractures. No acute osseous abnormality. Electronically Signed   By: Jasmine Pang M.D.   On: 12/09/2022 21:32   DG Elbow 2 Views Right  Result Date: 12/09/2022 CLINICAL DATA:  Fall EXAM: RIGHT ELBOW - 2 VIEW COMPARISON:  None Available. FINDINGS: There is no evidence of fracture, dislocation, or joint effusion. There is no evidence of arthropathy or other focal bone abnormality. Soft tissues are unremarkable. IMPRESSION: Negative. Electronically Signed   By: Darliss Cheney M.D.   On: 12/09/2022 21:30    Scheduled Meds:     amLODipine  10 mg Oral Daily   docusate sodium  100 mg Oral BID   donepezil  10 mg Oral QHS   enoxaparin (LOVENOX) injection  30 mg Subcutaneous Q24H   irbesartan  37.5 mg Oral Daily   levothyroxine  150 mcg Oral Daily   memantine  10 mg Oral Daily   multivitamin  1 tablet Oral Q breakfast   multivitamin with minerals  1 tablet Oral Q breakfast   pantoprazole  40 mg Oral Daily   QUEtiapine  50 mg Oral BID   sertraline  100 mg Oral Daily    Continuous Infusions:  cefTRIAXone (ROCEPHIN)  IV       LOS: 0 days     Marcellus Scott, MD,  FACP, [facility], [facility], [facility], [facility]   [facility] [place]     To contact the attending provider between 7A-7P or the covering provider during after hours 7P-7A, please log into the web site [email or web address] and access using universal [place] password for that web site. If you do not have the password, please call the hospital operator.  12/10/2022, 7:40 AM

## 2022-12-10 NOTE — Evaluation (Signed)
Physical Therapy Evaluation Patient Details Name: [name] MRN: [id number] DOB: [birth year] Today's Date: 12/10/2022  History of Present Illness  The pt is a 89 yo female presenting 5/1 after falling down a flight of stairs and was down for ~1 hr prior to getting assistance. PMH includes: HTN, CKD, Lewy body dementia, anemia, chronic fatigue, and hypothyroidism.   Clinical Impression  Pt in bed upon arrival of PT, agreeable to evaluation at this time. Prior to admission the pt was ambulating at home with intermittent use of walker, living alone in a home with stairs to enter. The pt did report a friend that would visit and assist with cooking and cleaning, but was unable to provide further details this morning and no other family was present. The pt required min-modA to complete sit-stand transfers and short bout of walking to recliner in the room due to instability and weakness. In addition to physical assist required, pt required significant cues for sequencing tasks related to feeding herself and demos deficits in problem solving and memory (possibly due to baseline dementia) that further increase risk of falls and injury if she were to be mobilizing without assistance. Given pt lacks assist at home and is not at functional baseline, recommend continued rehab after d/c until she is safe to return home or has adequate supervision.        Recommendations for follow up therapy are one component of a multi-disciplinary discharge planning process, led by the attending physician.  Recommendations may be updated based on patient status, additional functional criteria and insurance authorization.  Follow Up Recommendations Can patient physically be transported by private vehicle: Yes     Assistance Recommended at Discharge Frequent or constant Supervision/Assistance  Patient can return home with the following  A lot of help with walking and/or transfers;A little help with  bathing/dressing/bathroom;Direct supervision/assist for medications management;Assistance with feeding;Assistance with cooking/housework;Direct supervision/assist for financial management;Assist for transportation;Help with stairs or ramp for entrance    Equipment Recommendations None recommended by PT  Recommendations for Other Services       Functional Status Assessment Patient has had a recent decline in their functional status and demonstrates the ability to make significant improvements in function in a reasonable and predictable amount of time.     Precautions / Restrictions Precautions Precautions: Fall Precaution Comments: admitted after fall down stairs, dementia Restrictions Weight Bearing Restrictions: No      Mobility  Bed Mobility Overal bed mobility: Needs Assistance Bed Mobility: Supine to Sit     Supine to sit: Min assist, HOB elevated     General bed mobility comments: minA to complete with use of bed rails and HOB elevated    Transfers Overall transfer level: Needs assistance Equipment used: 1 person hand held assist Transfers: Sit to/from Stand, Bed to chair/wheelchair/BSC Sit to Stand: Mod assist   Step pivot transfers: Min assist       General transfer comment: modA to rise, pt reaching for bed rail and UE support on PT for assist. modA to steady initially. minA to step to chair with single UE support (no walker in room)    Ambulation/Gait Ambulation/Gait assistance: Min assist Gait Distance (Feet): 3 Feet Assistive device: 1 person hand held assist Gait Pattern/deviations: Step-through pattern, Decreased stride length, Shuffle Gait velocity: decreased Gait velocity interpretation: <1.31 ft/sec, indicative of household ambulator   General Gait Details: pt with small shuffling steps with frequent LOB needing minA to steady, pt too fatigued to progress at this time  Balance Overall balance assessment: Needs assistance, History of  Falls Sitting-balance support: No upper extremity supported, Feet supported Sitting balance-Leahy Scale: Fair     Standing balance support: Bilateral upper extremity supported, During functional activity Standing balance-Leahy Scale: Poor                               Pertinent Vitals/Pain Pain Assessment Pain Assessment: Faces Pain Score: 3  Faces Pain Scale: Hurts little more Pain Location: generalized Pain Descriptors / Indicators: Discomfort Pain Intervention(s): Limited activity within patient's tolerance, Monitored during session, Repositioned    Home Living Family/patient expects to be discharged to:: Private residence Living Arrangements: Alone Available Help at Discharge: Personal care attendant;Available PRN/intermittently (pt reports a friend comes 4 days/week) Type of Home: House Home Access: Level entry       Home Layout: One level Home Equipment: Rollator (4 wheels);BSC/3in1 Additional Comments: information from chart mostly, pt did give some matching info but generally reports difficulty with home set up questions    Prior Function Prior Level of Function : Needs assist             Mobility Comments: pt admitted for fall, per chart she uses walker, but pt states she doesn't usually. no family present to confirm, pt unable to provide other information ADLs Comments: pt reports assist for cooking and cleaning, uses sponge baths, does walk into bathroom for toilet and to kitchen for meals     Hand Dominance   Dominant Hand: Right    Extremity/Trunk Assessment   Upper Extremity Assessment Upper Extremity Assessment: Defer to OT evaluation    Lower Extremity Assessment Lower Extremity Assessment: Generalized weakness;RLE deficits/detail;LLE deficits/detail RLE Deficits / Details: grossly 3/5 to MMT RLE Sensation: WNL LLE Deficits / Details: grossly 3/5 to MMT, pt reports sore knee LLE Sensation: WNL    Cervical / Trunk  Assessment Cervical / Trunk Assessment: Kyphotic  Communication   Communication: No difficulties  Cognition Arousal/Alertness: Awake/alert Behavior During Therapy: Flat affect Overall Cognitive Status: Impaired/Different from baseline Area of Impairment: Orientation, Attention, Memory, Following commands, Safety/judgement, Awareness, Problem solving                 Orientation Level: Disoriented to, Situation, Time Current Attention Level: Focused Memory: Decreased short-term memory Following Commands: Follows one step commands inconsistently, Follows one step commands with increased time Safety/Judgement: Decreased awareness of safety, Decreased awareness of deficits Awareness: Intellectual Problem Solving: Slow processing, Decreased initiation, Difficulty sequencing, Requires verbal cues General Comments: pt with delayed processing and needs repeated cues/questions to attend to conversation. Pt frequently needing increased time and multiple errors to complete tasks related to feeding herself (picking up wrong cup, stabbing plate rather than food, taking fork to her mouth empty, etc) but was aware she was making errors        General Comments General comments (skin integrity, edema, etc.): multiple bandages and lacerations, mostly covered appropriately. VSS on RA    Exercises     Assessment/Plan    PT Assessment Patient needs continued PT services  PT Problem List Decreased strength;Decreased activity tolerance;Decreased balance;Decreased mobility;Decreased coordination;Decreased cognition;Decreased safety awareness       PT Treatment Interventions DME instruction;Gait training;Stair training;Functional mobility training;Therapeutic activities;Therapeutic exercise;Balance training;Patient/family education    PT Goals (Current goals can be found in the Care Plan section)  Acute Rehab PT Goals Patient Stated Goal: return to home PT Goal Formulation: With patient Time For  Goal  Achievement: 12/24/22 Potential to Achieve Goals: Good    Frequency Min 3X/week        AM-PAC PT "6 Clicks" Mobility  Outcome Measure Help needed turning from your back to your side while in a flat bed without using bedrails?: A Little Help needed moving from lying on your back to sitting on the side of a flat bed without using bedrails?: A Little Help needed moving to and from a bed to a chair (including a wheelchair)?: A Lot Help needed standing up from a chair using your arms (e.g., wheelchair or bedside chair)?: A Lot Help needed to walk in hospital room?: Total Help needed climbing 3-5 steps with a railing? : Total 6 Click Score: 12    End of Session Equipment Utilized During Treatment: Gait belt Activity Tolerance: Patient tolerated treatment well;Patient limited by fatigue Patient left: in chair;with call bell/phone within reach;with chair alarm set Nurse Communication: Mobility status PT Visit Diagnosis: Other abnormalities of gait and mobility (R26.89);Repeated falls (R29.6);Muscle weakness (generalized) (M62.81);Unsteadiness on feet (R26.81)    Time: 5366-4403 PT Time Calculation (min) (ACUTE ONLY): 31 min   Charges:   PT Evaluation $PT Eval Low Complexity: 1 Low PT Treatments $Therapeutic Activity: 8-22 mins        Vickki Muff, PT, DPT   Acute Rehabilitation Department Office [phone] Secure Chat Communication Preferred  Ronnie Derby 12/10/2022, 9:47 AM

## 2022-12-10 NOTE — Evaluation (Signed)
Occupational Therapy Evaluation Patient Details Name: [name] MRN: [id number] DOB: 01-11-1929 Today's Date: 12/10/2022   History of Present Illness The pt is a 89 yo female presenting 5/1 after falling down a flight of stairs and was down for ~1 hr prior to getting assistance. PMH includes: HTN, CKD, Lewy body dementia, anemia, chronic fatigue, and hypothyroidism.   Clinical Impression   PTA  pt lives at home alone and has a friend who assists 2x/wk for IADL tasks and her son who assists intermittently during the week. Pt has very poor vision which is significantly impacting her ability to complete ADL tasks and mobility in unfamiliar environment as noted below. Able to mobility to the bathroom with min A. Patient will benefit from continued inpatient follow up therapy, <3 hours/day. Acute OT to follow.  Discussed need for set up for meals with NT due to poor vision.      Recommendations for follow up therapy are one component of a multi-disciplinary discharge planning process, led by the attending physician.  Recommendations may be updated based on patient status, additional functional criteria and insurance authorization.   Assistance Recommended at Discharge Frequent or constant Supervision/Assistance  Patient can return home with the following A little help with walking and/or transfers;A little help with bathing/dressing/bathroom;Assistance with cooking/housework;Direct supervision/assist for medications management;Direct supervision/assist for financial management;Assist for transportation;Help with stairs or ramp for entrance    Functional Status Assessment  Patient has had a recent decline in their functional status and demonstrates the ability to make significant improvements in function in a reasonable and predictable amount of time.  Equipment Recommendations  BSC/3in1    Recommendations for Other Services       Precautions / Restrictions Precautions Precautions:  Fall Precaution Comments: admitted after fall down stairs, dementia Restrictions Weight Bearing Restrictions: No      Mobility Bed Mobility Overal bed mobility: Needs Assistance Bed Mobility: Supine to Sit, Sit to Supine     Supine to sit: HOB elevated, Min guard          Transfers Overall transfer level: Needs assistance Equipment used: 1 person hand held assist Transfers: Sit to/from Stand, Bed to chair/wheelchair/BSC Sit to Stand: Min assist                  Balance Overall balance assessment: Needs assistance, History of Falls Sitting-balance support: No upper extremity supported, Feet supported Sitting balance-Leahy Scale: Fair     Standing balance support: Bilateral upper extremity supported, During functional activity Standing balance-Leahy Scale: Poor                             ADL either performed or assessed with clinical judgement   ADL                                               Vision Baseline Vision/History: 1 Wears glasses;4 Cataracts;6 Macular Degeneration Ability to See in Adequate Light: 1 Impaired Patient Visual Report: Blurring of vision;Central vision impairment;Peripheral vision impairment Vision Assessment?: Vision impaired- to be further tested in functional context Additional Comments: impaired at baseline; "I can't see  a thing"     Perception     Praxis      Pertinent Vitals/Pain Pain Assessment Pain Assessment: Faces Faces Pain Scale: Hurts little more Pain Location: generalized (esp  BLE) Pain Descriptors / Indicators: Discomfort Pain Intervention(s): Limited activity within patient's tolerance     Hand Dominance Right   Extremity/Trunk Assessment Upper Extremity Assessment Upper Extremity Assessment: Generalized weakness   Lower Extremity Assessment Lower Extremity Assessment: Generalized weakness;RLE deficits/detail;LLE deficits/detail RLE Deficits / Details: grossly 3/5 to  MMT RLE Sensation: WNL LLE Deficits / Details: grossly 3/5 to MMT, pt reports sore knee LLE Sensation: WNL   Cervical / Trunk Assessment Cervical / Trunk Assessment: Kyphotic   Communication Communication Communication: No difficulties   Cognition Arousal/Alertness: Awake/alert Behavior During Therapy: Flat affect Overall Cognitive Status: No family/caregiver present to determine baseline cognitive functioning Area of Impairment: Orientation, Attention, Memory, Following commands, Safety/judgement, Awareness, Problem solving                 Orientation Level: Disoriented to, Time Current Attention Level: Selective Memory: Decreased short-term memory Following Commands: Follows one step commands with increased time Safety/Judgement: Decreased awareness of safety, Decreased awareness of deficits Awareness: Emergent Problem Solving: Slow processing General Comments: pt with delayed processing and needs repeated cues/questions to attend to conversation. Pt frequently needing increased time and multiple errors to complete tasks related to feeding herself (picking up wrong cup, stabbing plate rather than food, taking fork to her mouth empty, etc) but was aware she was making errors     General Comments  multiple bruises; stitches    Exercises     Shoulder Instructions      Home Living Family/patient expects to be discharged to:: Private residence Living Arrangements: Alone Available Help at Discharge: Personal care attendant;Available PRN/intermittently (pt reports a friend comes 4 days/week) Type of Home: House Home Access: Level entry     Home Layout: One level     Bathroom Shower/Tub: Chief Strategy Officer: Standard Bathroom Accessibility: Yes   Home Equipment: Rollator (4 wheels);BSC/3in1   Additional Comments: information from chart mostly, pt did give some matching info but generally reports difficulty with home set up questions      Prior  Functioning/Environment Prior Level of Function : Needs assist             Mobility Comments: pt admitted for fall, per chart she uses walker, but pt states she doesn't usually. no family present to confirm, pt unable to provide other information ADLs Comments: pt reports assist for cooking and cleaning, uses sponge baths, does walk into bathroom for toilet and to kitchen for meals        OT Problem List: Decreased activity tolerance;Decreased strength;Impaired balance (sitting and/or standing);Decreased cognition;Impaired vision/perception;Decreased safety awareness;Decreased knowledge of use of DME or AE;Pain      OT Treatment/Interventions: Self-care/ADL training;Therapeutic exercise;DME and/or AE instruction;Therapeutic activities;Cognitive remediation/compensation;Patient/family education;Balance training    OT Goals(Current goals can be found in the care plan section) Acute Rehab OT Goals Patient Stated Goal: To get better OT Goal Formulation: With patient Time For Goal Achievement: 12/24/22 Potential to Achieve Goals: Good  OT Frequency: Min 2X/week    Co-evaluation              AM-PAC OT "6 Clicks" Daily Activity     Outcome Measure Help from another person eating meals?: A Little Help from another person taking care of personal grooming?: A Little Help from another person toileting, which includes using toliet, bedpan, or urinal?: A Little Help from another person bathing (including washing, rinsing, drying)?: A Lot Help from another person to put on and taking off regular upper body clothing?: A Little Help  from another person to put on and taking off regular lower body clothing?: A Lot 6 Click Score: 16   End of Session Equipment Utilized During Treatment: Gait belt;Rolling walker (2 wheels) Nurse Communication: Mobility status  Activity Tolerance: Patient tolerated treatment well Patient left: in bed;with call bell/phone within reach;with bed alarm set  OT  Visit Diagnosis: Unsteadiness on feet (R26.81);Other abnormalities of gait and mobility (R26.89);Muscle weakness (generalized) (M62.81);History of falling (Z91.81);Other symptoms and signs involving cognitive function;Pain Pain - Right/Left:  (B) Pain - part of body: Knee;Leg                Time: 1610-9604 OT Time Calculation (min): 36 min Charges:  OT General Charges $OT Visit: 1 Visit OT Evaluation $OT Eval Moderate Complexity: 1 Mod OT Treatments $Self Care/Home Management : 8-22 mins  Luisa Dago, OT/L   Acute OT Clinical Specialist Acute Rehabilitation Services Pager [phone] Office [phone]   [facility] 12/10/2022, 12:06 PM

## 2022-12-10 NOTE — ED Notes (Signed)
Pt given sandwich/ family at bedside

## 2022-12-10 NOTE — H&P (Signed)
History and Physical  [name] NWG:[id number] DOB: 1929-02-21 DOA: 12/09/2022  Referring physician: Rayfield [name]  PCP: [name] Frees, NP  Outpatient Specialists: None Patient coming from: Home, lives alone  Chief Complaint: Fall, unwitnessed.   HPI: [name] is a 89 y.o. female with medical history significant for dementia, hypothyroidism, hypertension, hyperlipidemia, CKD 3B, anemia of chronic disease, chronic insomnia, who presented to [facility] ED from home after an unwitnessed fall.  The patient has dementia and is unable to provide a history.  Reportedly the patient fell as she was going down a step, down into her garage, and landing on concrete floor injuring her right side of forehead, right elbow skin tear, bilateral knee pain, small lacerations to nose, bilateral hip pain.  Denies loss of consciousness.  Unclear how long she was on the floor.  EMS was activated.  In the ED, the patient is alert but confused with underlying dementia.  Imaging negative for any acute fractures.  Lab studies notable for leukocytosis with WBC 12.2K.  UA was positive for pyuria.  The patient received Rocephin empirically for UTI.  Additionally she received 4 mg IV morphine x 1 and 500 cc IV fluid bolus NS.  EDP requested admission for further management of her present condition.  The patient was admitted by [facility], hospitalist service, to telemetry surgical unit as inpatient status.  PT OT consulted and fall precautions have been initiated.  ED Course: Tmax 98.  BP 170/90, pulse 68, respiratory rate 12, saturation 100% on room air.  Lab studies remarkable for WBC 12.2, hemoglobin 9.8, platelet count 271.  Serum sodium 133, serum bicarb 21, glucose 127, creatinine 1.51 with GFR of 32.  CPK 67.  Review of Systems: Review of systems as noted in the HPI. All other systems reviewed and are negative.   Past Medical History:  Diagnosis Date   Anemia    Chronic fatigue    Chronic renal insufficiency, stage  III (moderate) (HCC)    Depression    HYPERTENSION 04/04/2007   HYPOTHYROIDISM 04/04/2007   Lewy body dementia (HCC)    Macular degeneration    Past Surgical History:  Procedure Laterality Date   ABDOMINAL HYSTERECTOMY     APPENDECTOMY     BACK SURGERY  2012   Cataracts     CHOLECYSTECTOMY     ERCP N/A 11/22/2014   Procedure: ENDOSCOPIC RETROGRADE CHOLANGIOPANCREATOGRAPHY (ERCP);  Surgeon: Vida Rigger, MD;  Location: [facility] ENDOSCOPY;  Service: Endoscopy;  Laterality: N/A;   HERNIA REPAIR     OOPHORECTOMY     ORIF TIBIA FRACTURE  2012   dr Lajoyce Corners   SPINE SURGERY  2011   cervical    Social History:  reports that she quit smoking about 53 years ago. Her smoking use included cigarettes. She has a 7.00 pack-year smoking history. She has never used smokeless tobacco. She reports that she does not drink alcohol and does not use drugs.   Allergies  Allergen Reactions   Oxycodone Hcl Itching    Family History  Problem Relation Age of Onset   Kidney cancer Mother 18       renal cell CA   Heart disease Father    Lymphoma Son    Breast cancer Daughter       Prior to Admission medications   Medication Sig Start Date End Date Taking? Authorizing Provider  amLODipine (NORVASC) 10 MG tablet TAKE 1 TABLET BY MOUTH DAILY 06/23/22   Nafziger, Kandee Keen, NP  Brexpiprazole (REXULTI) 0.5 MG  TABS Take 1 tablet every night 12/04/22   Van Clines, MD  diphenhydramine-acetaminophen (TYLENOL PM) 25-500 MG TABS tablet Take 1 tablet by mouth at bedtime as needed (for pain/sleep).    [provider]  donepezil (ARICEPT) 10 MG tablet Take 1 tablet daily Patient taking differently: Take 10 mg by mouth at bedtime. Take 1 tablet daily 04/15/22   Van Clines, MD  levothyroxine (SYNTHROID) 150 MCG tablet TAKE 1 TABLET BY MOUTH DAILY 06/23/22   Nafziger, Kandee Keen, NP  memantine (NAMENDA) 10 MG tablet TAKE 1 TABLET BY MOUTH IN THE  MORNING AND AT BEDTIME 12/07/22   Van Clines, MD  Multiple  Vitamins-Minerals (ONE-A-DAY WOMENS 50+) TABS Take 1 tablet by mouth daily with breakfast.    [provider]  Multiple Vitamins-Minerals (PRESERVISION AREDS 2 PO) Take 1 capsule by mouth daily with breakfast.    [provider]  olmesartan (BENICAR) 20 MG tablet TAKE 1 TABLET BY MOUTH DAILY 06/23/22   Nafziger, Kandee Keen, NP  omeprazole (PRILOSEC) 40 MG capsule TAKE 1 CAPSULE BY MOUTH DAILY Patient taking differently: in the morning and at bedtime. 06/23/22   Nafziger, Kandee Keen, NP  QUEtiapine (SEROQUEL) 50 MG tablet Take 1 tablet twice a day Patient taking differently: Take 50 mg by mouth in the morning and at bedtime. 04/15/22   Van Clines, MD  sertraline (ZOLOFT) 100 MG tablet Take 1 tablet (100 mg total) by mouth daily. Patient taking differently: Take 100 mg by mouth in the morning and at bedtime. 04/15/22   Van Clines, MD  SYSTANE ULTRA PF 0.4-0.3 % SOLN Place 1 drop into both eyes 3 (three) times daily as needed (for dryness).    [provider]  TYLENOL 500 MG tablet Take 500 mg by mouth every 6 (six) hours as needed for mild pain or headache.    [provider]    Physical Exam: BP (!) 170/90 (BP Location: Right Arm)   Pulse 68   Temp 98 F (36.7 C) (Oral)   Resp 12   Wt 75 kg   SpO2 100%   BMI 30.24 kg/m   General: 89 y.o. year-old female well developed well nourished in no acute distress.  Alert and confused. Cardiovascular: Regular rate and rhythm with no rubs or gallops.  No thyromegaly or JVD noted.  No lower extremity edema. 2/4 pulses in all 4 extremities. Respiratory: Clear to auscultation with no wheezes or rales. Good inspiratory effort. Abdomen: Soft nontender nondistended with normal bowel sounds x4 quadrants. Muskuloskeletal: No cyanosis, clubbing or edema noted bilaterally Neuro: CN II-XII intact, strength, sensation, reflexes Skin: No ulcerative lesions noted or rashes, lacerations present from her fall. Psychiatry: Unable to  assess judgment and mood due to confusion.         Labs on Admission:  Basic Metabolic Panel: Recent Labs  Lab 12/09/22 2139  NA 133*  K 4.7  CL 103  CO2 21*  GLUCOSE 127*  BUN 17  CREATININE 1.51*  CALCIUM 8.9   Liver Function Tests: No results for input(s): "AST", "ALT", "ALKPHOS", "BILITOT", "PROT", "ALBUMIN" in the last 168 hours. No results for input(s): "LIPASE", "AMYLASE" in the last 168 hours. No results for input(s): "AMMONIA" in the last 168 hours. CBC: Recent Labs  Lab 12/09/22 2139  WBC 12.2*  HGB 9.8*  HCT 29.2*  MCV 94.5  PLT 271   Cardiac Enzymes: Recent Labs  Lab 12/09/22 2139  CKTOTAL 67    BNP (last 3 results) No  results for input(s): "BNP" in the last 8760 hours.  ProBNP (last 3 results) No results for input(s): "PROBNP" in the last 8760 hours.  CBG: Recent Labs  Lab 12/09/22 2148  GLUCAP 115*    Radiological Exams on Admission: DG Wrist Complete Right  Result Date: 12/09/2022 CLINICAL DATA:  Fall EXAM: RIGHT WRIST - COMPLETE 3+ VIEW COMPARISON:  None Available. FINDINGS: No fracture or malalignment. Mild degenerative change at the first [facility] joint. Soft tissue swelling greatest adjacent to the ulna. IMPRESSION: No acute osseous abnormality. Soft tissue swelling. Electronically Signed   By: Jasmine Pang M.D.   On: 12/09/2022 21:34   DG Tibia/Fibula Right  Result Date: 12/09/2022 CLINICAL DATA:  Fall EXAM: RIGHT TIBIA AND FIBULA - 2 VIEW COMPARISON:  12/17/2016 FINDINGS: No fracture or malalignment. Vascular calcifications. Soft tissue swelling. IMPRESSION: No acute osseous abnormality. Electronically Signed   By: Jasmine Pang M.D.   On: 12/09/2022 21:33   DG Tibia/Fibula Left  Result Date: 12/09/2022 CLINICAL DATA:  Fall EXAM: LEFT TIBIA AND FIBULA - 2 VIEW COMPARISON:  11/14/2010, 11/10/2010 FINDINGS: Surgical plate and fixating screws in the proximal tibia. No acute displaced fracture or malalignment. Generalized soft tissue edema. Old  tibial plateau fractures IMPRESSION: Postsurgical changes of the proximal tibia with old tibial plateau fractures. No acute osseous abnormality. Electronically Signed   By: Jasmine Pang M.D.   On: 12/09/2022 21:32   DG Elbow 2 Views Right  Result Date: 12/09/2022 CLINICAL DATA:  Fall EXAM: RIGHT ELBOW - 2 VIEW COMPARISON:  None Available. FINDINGS: There is no evidence of fracture, dislocation, or joint effusion. There is no evidence of arthropathy or other focal bone abnormality. Soft tissues are unremarkable. IMPRESSION: Negative. Electronically Signed   By: Darliss Cheney M.D.   On: 12/09/2022 21:30    EKG: I independently viewed the EKG done and my findings are as followed: Sinus rhythm rate of 59.  Nonspecific ST-T changes.  QTc 426.  Assessment/Plan Present on Admission:  Fall  Principal Problem:   Fall  Unwitnessed fall in a patient with dementia The patient lives alone Unable to provide a history due to underlying dementia. Imaging negative for acute fracture CPK reassuring 67 PT OT assessment Fall precautions. As needed analgesics.  Presumptive UTI, POA Presented with leukocytosis and UA positive for pyuria Obtain urine culture for ID and sensitivities Started on Rocephin in the ED, continue  Dementia Reorient as needed Resume home regimen Fall precautions  Hypothyroidism Resume home regimen  Hypertension Resume home regimen Monitor vital signs  Chronic anxiety/depression Resume home regimen.  GERD Resume home PPI.  Chronic insomnia Resume home regimen      DVT prophylaxis: Subcu Lovenox daily to start tonight 2200 on 12/10/2022.  Code Status: Full code by default, no family member at bedside, no DNR form at bedside.  Please address CODE STATUS with the patient's POA in the morning.  Family Communication: None at bedside.  Disposition Plan: Admitted to telemetry surgical unit  Consults called: None.  Admission status: Inpatient status.   Status  is: Inpatient The patient requires at least 2 midnight for further evaluation and treatment of present condition.   Darlin Drop MD [facility] Pager [phone]  If 7PM-7AM, please contact night-coverage [email or web address] Password [email or web address]  12/10/2022, 1:40 AM

## 2022-12-10 NOTE — ED Provider Notes (Signed)
Accepted handoff at shift change from [facility]. Please see prior provider note for more detail.   Briefly: Patient is 89 y.o.    Patient with history of hypertension, CKD, Lewy body dementia, anemia, hypothyroidism  She was brought to the emergency room today after fall at home she lives by herself does not use her walker and fell at home.  She was on the ground for approximately an hour per her account.  Notably she has somewhat severe dementia.  Her son is at bedside with her.      DDX: concern for fx  Plan: rule out fx and hemorrhage -> Admit    Physical Exam  BP (!) 170/90 (BP Location: Right Arm)   Pulse 68   Temp 98 F (36.7 C) (Oral)   Resp 12   Wt 75 kg   SpO2 100%   BMI 30.24 kg/m   Physical Exam  Procedures  Procedures Results for orders placed or performed during the hospital encounter of 12/09/22  Basic metabolic panel  Result Value Ref Range   Sodium 133 (L) 135 - 145 mmol/L   Potassium 4.7 3.5 - 5.1 mmol/L   Chloride 103 98 - 111 mmol/L   CO2 21 (L) 22 - 32 mmol/L   Glucose, Bld 127 (H) 70 - 99 mg/dL   BUN 17 8 - 23 mg/dL   Creatinine, Ser 1.61 (H) 0.44 - 1.00 mg/dL   Calcium 8.9 8.9 - 09.6 mg/dL   GFR, Estimated 32 (L) >60 mL/min   Anion gap 9 5 - 15  CBC  Result Value Ref Range   WBC 12.2 (H) 4.0 - 10.5 K/uL   RBC 3.09 (L) 3.87 - 5.11 MIL/uL   Hemoglobin 9.8 (L) 12.0 - 15.0 g/dL   HCT 04.5 (L) 40.9 - 81.1 %   MCV 94.5 80.0 - 100.0 fL   MCH 31.7 26.0 - 34.0 pg   MCHC 33.6 30.0 - 36.0 g/dL   RDW 91.4 78.2 - 95.6 %   Platelets 271 150 - 400 K/uL   nRBC 0.0 0.0 - 0.2 %  Urinalysis, Routine w reflex microscopic -Urine, Clean Catch  Result Value Ref Range   Color, Urine YELLOW YELLOW   APPearance HAZY (A) CLEAR   Specific Gravity, Urine 1.011 1.005 - 1.030   pH 5.0 5.0 - 8.0   Glucose, UA NEGATIVE NEGATIVE mg/dL   Hgb urine dipstick NEGATIVE NEGATIVE   Bilirubin Urine NEGATIVE NEGATIVE   Ketones, ur NEGATIVE NEGATIVE mg/dL    Protein, ur 213 (A) NEGATIVE mg/dL   Nitrite NEGATIVE NEGATIVE   Leukocytes,Ua MODERATE (A) NEGATIVE   RBC / HPF 0-5 0 - 5 RBC/hpf   WBC, UA 21-50 0 - 5 WBC/hpf   Bacteria, UA NONE SEEN NONE SEEN   Squamous Epithelial / HPF 0-5 0 - 5 /HPF   Mucus PRESENT    Hyaline Casts, UA PRESENT   CK  Result Value Ref Range   Total CK 67 38 - 234 U/L  CBG monitoring, ED  Result Value Ref Range   Glucose-Capillary 115 (H) 70 - 99 mg/dL   DG Wrist Complete Right  Result Date: 12/09/2022 CLINICAL DATA:  Fall EXAM: RIGHT WRIST - COMPLETE 3+ VIEW COMPARISON:  None Available. FINDINGS: No fracture or malalignment. Mild degenerative change at the first [facility] joint. Soft tissue swelling greatest adjacent to the ulna. IMPRESSION: No acute osseous abnormality. Soft tissue swelling. Electronically Signed   By: Jasmine Pang M.D.   On: 12/09/2022 21:34  DG Tibia/Fibula Right  Result Date: 12/09/2022 CLINICAL DATA:  Fall EXAM: RIGHT TIBIA AND FIBULA - 2 VIEW COMPARISON:  12/17/2016 FINDINGS: No fracture or malalignment. Vascular calcifications. Soft tissue swelling. IMPRESSION: No acute osseous abnormality. Electronically Signed   By: Jasmine Pang M.D.   On: 12/09/2022 21:33   DG Tibia/Fibula Left  Result Date: 12/09/2022 CLINICAL DATA:  Fall EXAM: LEFT TIBIA AND FIBULA - 2 VIEW COMPARISON:  11/14/2010, 11/10/2010 FINDINGS: Surgical plate and fixating screws in the proximal tibia. No acute displaced fracture or malalignment. Generalized soft tissue edema. Old tibial plateau fractures IMPRESSION: Postsurgical changes of the proximal tibia with old tibial plateau fractures. No acute osseous abnormality. Electronically Signed   By: Jasmine Pang M.D.   On: 12/09/2022 21:32   DG Elbow 2 Views Right  Result Date: 12/09/2022 CLINICAL DATA:  Fall EXAM: RIGHT ELBOW - 2 VIEW COMPARISON:  None Available. FINDINGS: There is no evidence of fracture, dislocation, or joint effusion. There is no evidence of arthropathy or other  focal bone abnormality. Soft tissues are unremarkable. IMPRESSION: Negative. Electronically Signed   By: Darliss Cheney M.D.   On: 12/09/2022 21:30       ED Course / MDM   Clinical Course as of 12/10/22 0124  Wed Dec 09, 2022  2326 Hx of dementia -  Larey Seat today no LOC, on ground for 1 hour. Bruised all over.  Lives by herself. Pain control and gentle hydration.  [WF]  Thu Dec 10, 2022  0029 CT imaging shows - NML head CT (some L swelling of skin/bruising) - No fx onf CT max face - CAD/aoritc atheroscerosis  [WF]  0031 No fx on xrays [WF]  0049 Hip without fx on CT - may need pt/ot and eventually MRI  [WF]    Clinical Course User Index [WF] Gailen Shelter, PA   Medical Decision Making Amount and/or Complexity of Data Reviewed Labs: ordered. Radiology: ordered.  Risk OTC drugs. Prescription drug management. Decision regarding hospitalization.   Patient with fall at home she is 89 years old and has dementia.  No fractures  No fractures on imaging.  Discussed with Dr. Margo Aye who will admit       Gailen Shelter, PA 12/10/22 2952    Shon Baton, MD 12/12/22 [phone]

## 2022-12-10 NOTE — ED Notes (Signed)
Pt placed on bedpan/ pt repositioned in bed/ provider removed C-collar

## 2022-12-10 NOTE — ED Notes (Signed)
ED TO INPATIENT HANDOFF REPORT  ED Nurse Name and Phone #: Amil Amen [phone]  S Name/Age/Gender [name] 89 y.o. female Room/Bed: 043C/043C  Code Status   Code Status: Full Code  Home/SNF/Other Home Patient oriented to: self, place, time, and situation Is this baseline? Yes   Triage Complete: Triage complete  Chief Complaint Fall [W19.XXXA]  Triage Note BIB EMS/ pt fell going down a step down into garage landing on concrete floor / pt injured right side of forehead/ bleeding controlled/ right elbow skin tear/ bil knee pain/ left upper forearm skin tear/ small lac to nose/ bil hip pain/ denies LOC/ denies thinners   Allergies Allergies  Allergen Reactions   Oxycodone Hcl Itching    Level of Care/Admitting Diagnosis ED Disposition     ED Disposition  Admit   Condition  --   Comment  Hospital Area: [facility] [[id number]]  Level of Care: Telemetry Surgical [105]  May admit patient to [facility] or [facility] if equivalent level of care is available:: Yes  Covid Evaluation: Asymptomatic - no recent exposure (last 10 days) testing not required  Diagnosis: Fall [290176]  Admitting Physician: Darlin Drop [[id number]]  Attending Physician: Darlin Drop [[id number]]  Certification:: I certify this patient will need inpatient services for at least 2 midnights  Estimated Length of Stay: 2          B Medical/Surgery History Past Medical History:  Diagnosis Date   Anemia    Chronic fatigue    Chronic renal insufficiency, stage III (moderate) (HCC)    Depression    HYPERTENSION 04/04/2007   HYPOTHYROIDISM 04/04/2007   Lewy body dementia (HCC)    Macular degeneration    Past Surgical History:  Procedure Laterality Date   ABDOMINAL HYSTERECTOMY     APPENDECTOMY     BACK SURGERY  2012   Cataracts     CHOLECYSTECTOMY     ERCP N/A 11/22/2014   Procedure: ENDOSCOPIC RETROGRADE CHOLANGIOPANCREATOGRAPHY (ERCP);  Surgeon: Vida Rigger, MD;  Location: [facility]  ENDOSCOPY;  Service: Endoscopy;  Laterality: N/A;   HERNIA REPAIR     OOPHORECTOMY     ORIF TIBIA FRACTURE  2012   dr Lajoyce Corners   [facility] SURGERY  2011   cervical     A IV Location/Drains/Wounds Patient Lines/Drains/Airways Status     Active Line/Drains/Airways     Name Placement date Placement time Site Days   Peripheral IV 12/09/22 20 G Right Antecubital 12/09/22  2121  Antecubital  1            Intake/Output Last 24 hours No intake or output data in the 24 hours ending 12/10/22 0344  Labs/Imaging Results for orders placed or performed during the hospital encounter of 12/09/22 (from the past 48 hour(s))  Urinalysis, Routine w reflex microscopic -Urine, Clean Catch     Status: Abnormal   Collection Time: 12/09/22  8:50 PM  Result Value Ref Range   Color, Urine YELLOW YELLOW   APPearance HAZY (A) CLEAR   Specific Gravity, Urine 1.011 1.005 - 1.030   pH 5.0 5.0 - 8.0   Glucose, UA NEGATIVE NEGATIVE mg/dL   Hgb urine dipstick NEGATIVE NEGATIVE   Bilirubin Urine NEGATIVE NEGATIVE   Ketones, ur NEGATIVE NEGATIVE mg/dL   Protein, ur 956 (A) NEGATIVE mg/dL   Nitrite NEGATIVE NEGATIVE   Leukocytes,Ua MODERATE (A) NEGATIVE   RBC / HPF 0-5 0 - 5 RBC/hpf   WBC, UA 21-50 0 - 5 WBC/hpf  Bacteria, UA NONE SEEN NONE SEEN   Squamous Epithelial / HPF 0-5 0 - 5 /HPF   Mucus PRESENT    Hyaline Casts, UA PRESENT     Comment: Performed at [facility], [address], [place], [place] [address]  Basic metabolic panel     Status: Abnormal   Collection Time: 12/09/22  9:39 PM  Result Value Ref Range   Sodium 133 (L) 135 - 145 mmol/L   Potassium 4.7 3.5 - 5.1 mmol/L   Chloride 103 98 - 111 mmol/L   CO2 21 (L) 22 - 32 mmol/L   Glucose, Bld 127 (H) 70 - 99 mg/dL    Comment: Glucose reference range applies only to samples taken after fasting for at least 8 hours.   BUN 17 8 - 23 mg/dL   Creatinine, Ser 5.28 (H) 0.44 - 1.00 mg/dL   Calcium 8.9 8.9 - 41.3 mg/dL   GFR, Estimated 32  (L) >60 mL/min    Comment: (NOTE) Calculated using the CKD-EPI Creatinine Equation (2021)    Anion gap 9 5 - 15    Comment: Performed at [facility], [address], [place], [place] [address]  CBC     Status: Abnormal   Collection Time: 12/09/22  9:39 PM  Result Value Ref Range   WBC 12.2 (H) 4.0 - 10.5 K/uL   RBC 3.09 (L) 3.87 - 5.11 MIL/uL   Hemoglobin 9.8 (L) 12.0 - 15.0 g/dL   HCT 02.7 (L) 25.3 - 66.4 %   MCV 94.5 80.0 - 100.0 fL   MCH 31.7 26.0 - 34.0 pg   MCHC 33.6 30.0 - 36.0 g/dL   RDW 40.3 47.4 - 25.9 %   Platelets 271 150 - 400 K/uL   nRBC 0.0 0.0 - 0.2 %    Comment: Performed at [facility], [address], [place], [place] [address]  CK     Status: None   Collection Time: 12/09/22  9:39 PM  Result Value Ref Range   Total CK 67 38 - 234 U/L    Comment: Performed at [facility], [address] [place], [place] [address]  CBG monitoring, ED     Status: Abnormal   Collection Time: 12/09/22  9:48 PM  Result Value Ref Range   Glucose-Capillary 115 (H) 70 - 99 mg/dL    Comment: Glucose reference range applies only to samples taken after fasting for at least 8 hours.  CBC     Status: Abnormal   Collection Time: 12/10/22  2:52 AM  Result Value Ref Range   WBC 8.6 4.0 - 10.5 K/uL   RBC 2.63 (L) 3.87 - 5.11 MIL/uL   Hemoglobin 8.3 (L) 12.0 - 15.0 g/dL   HCT 29.5 (L) 18.8 - 41.6 %   MCV 94.3 80.0 - 100.0 fL   MCH 31.6 26.0 - 34.0 pg   MCHC 33.5 30.0 - 36.0 g/dL   RDW 60.6 30.1 - 60.1 %   Platelets 224 150 - 400 K/uL   nRBC 0.0 0.0 - 0.2 %    Comment: Performed at [facility], [address], [place], [place] [address]  Basic metabolic panel     Status: Abnormal   Collection Time: 12/10/22  2:52 AM  Result Value Ref Range   Sodium 132 (L) 135 - 145 mmol/L   Potassium 4.6 3.5 - 5.1 mmol/L   Chloride 102 98 - 111 mmol/L   CO2 22 22 -  32 mmol/L   Glucose, Bld 130 (H) 70 - 99 mg/dL    Comment: Glucose reference range applies only to samples  taken after fasting for at least 8 hours.   BUN 18 8 - 23 mg/dL   Creatinine, Ser 1.61 (H) 0.44 - 1.00 mg/dL   Calcium 8.4 (L) 8.9 - 10.3 mg/dL   GFR, Estimated 33 (L) >60 mL/min    Comment: (NOTE) Calculated using the CKD-EPI Creatinine Equation (2021)    Anion gap 8 5 - 15    Comment: Performed at [facility], [address], [place], [place] [address]  Magnesium     Status: None   Collection Time: 12/10/22  2:52 AM  Result Value Ref Range   Magnesium 2.0 1.7 - 2.4 mg/dL    Comment: Performed at [facility], [address] [place], [place] [address]  Phosphorus     Status: None   Collection Time: 12/10/22  2:52 AM  Result Value Ref Range   Phosphorus 3.6 2.5 - 4.6 mg/dL    Comment: Performed at [facility], [address] [place], [place] [address]   CT HEAD WO CONTRAST  Addendum Date: 12/10/2022   ADDENDUM REPORT: 12/10/2022 01:54 ADDENDUM: The interval cervical fusion, new from prior studies was actually at C6-7, not at C5-6. Electronically Signed   By: Almira Bar M.D.   On: 12/10/2022 01:54   Result Date: 12/10/2022 CLINICAL DATA:  89 year old with blunt polytrauma, unspecified trauma mechanism. EXAM: CT HEAD WITHOUT CONTRAST CT MAXILLOFACIAL WITHOUT CONTRAST CT CERVICAL SPINE WITHOUT CONTRAST CT CHEST, ABDOMEN AND PELVIS WITHOUT CONTRAST TECHNIQUE: Contiguous axial images were obtained from the base of the skull through the vertex without intravenous contrast. Multidetector CT imaging of the maxillofacial structures was performed. Multiplanar CT image reconstructions were also generated. A small metallic BB was placed on the right temple in order to reliably differentiate right from left. Multidetector CT imaging of the cervical spine was performed without intravenous contrast. Multiplanar CT image reconstructions were also generated. Multidetector CT imaging of the chest, abdomen and pelvis was performed without administration of intravenous contrast.  RADIATION DOSE REDUCTION: This exam was performed according to the departmental dose-optimization program which includes automated exposure control, adjustment of the mA and/or kV according to patient size and/or use of iterative reconstruction technique. COMPARISON:  CT scan head 05/05/2022, MRI cervical spine 04/13/2010, CT abdomen pelvis with contrast 02/14/2016. Chest PA and lateral 12/17/2016 FINDINGS: CT HEAD FINDINGS Brain: There is mild global atrophy for age, mild atrophic ventriculomegaly and small-vessel disease. No infarct, hemorrhage or mass are seen. There is no midline shift. Basal cisterns are clear. Vascular: There are calcifications in the siphons, distal vertebral and basilar arteries. No hyperdense central vessels. Skull: No fracture or focal lesions are seen. There is left lateral high parietal scalp swelling. Other: None. CT MAXILLOFACIAL FINDINGS Osseous: The facial study is motion compromised. There is no obvious displaced fracture but a subtle fracture would be missed. No mandibular dislocation. Orbits: No traumatic or inflammatory finding is seen through the motion artifacts. There are old lens replacements. Sinuses: No significant sinus disease is seen through the motion artifacts. The mastoid air cells are clear. The ostiomeatal complexes are patent. Nasal septum is midline. Soft tissues: There is a skin laceration in the lateral right periorbital area with mild swelling. The epiglottis and tongue base are normal. CT CERVICAL SPINE FINDINGS Alignment: There is a chronic trace degenerative anterolisthesis at C5-6, slight levoscoliosis, and again  noted bone-on-bone anterior atlantodental joint space loss with osteophytes and sclerosis of the dens. There is no new or acute traumatic malalignment. Skull base and vertebrae: Old anterior plate fusion W0-9 with solid arthrodesis is again noted. There has been interval C5-6 anterior plate fusion with solid arthrodesis at this level as well with  the facet joints remaining patent. There is osteopenia with no evidence of fractures or primary pathologic process. There are small endplate Schmorl's nodes at C2-3. Soft tissues and spinal canal: No prevertebral fluid or swelling. No visible canal hematoma. There is no laryngeal mass. There is heavy calcification in the proximal cervical ICAs. There is advanced fatty replacement of the parotid glands, moderate fatty replacement of the submandibular gland. Disc levels: Partial disc space loss noted C2-3 and C3-4. Normal disc height at C5-6 and C7-T1. Fused disc spaces C4-5 and C6-7. Metal artifact from the fusion hardware does limit evaluation of the spinal canal but there is no obvious herniated disc or cord compromise. There is trace marginal endplate ridging at C5-6 but no significant disc osteophyte complex. Facet joint and uncinate spurring is seen at most levels, with foraminal stenosis which is bilaterally severe C3-4, moderate on the right and mild on the left at C4-5, moderate on the right at C5-6. Remaining foramina are clear. Other: None. CT CHEST FINDINGS Cardiovascular: The cardiac size is normal. The coronary arteries are heavily calcified. There is no pericardial effusion. There is mild tortuosity and moderate to heavy calcification in the thoracic aorta, patchy calcific plaques in the great vessels. There is no aortic aneurysm. The pulmonary arteries and veins are normal in caliber. Mediastinum/Nodes: Thyroid gland is atrophic, small in size without mass. No axillary or intrathoracic adenopathy are seen. Small hiatal hernia. There are calcified precarinal lymph nodes to the right. Lungs/Pleura: Calcified granuloma right upper lobe. Reticulated scarring both lung apices symmetrically. The lungs are otherwise clear. There is no pleural effusion, thickening or pneumothorax. Mild chronic elevation right hemidiaphragm. Musculoskeletal: There is osteopenia with degenerative changes of the first spine, mild  kyphodextroscoliosis. No spinal compression fractures are seen. The ribs, sternum and visualized shoulder girdles show no displaced fractures. CT ABDOMEN AND PELVIS FINDINGS Hepatobiliary: Chronic postcholecystectomy intrahepatic and extrahepatic bile duct dilatation. The common bile duct is stable in caliber 16.2 mm. No ductal filling defect is seen. No focal abnormality in the unenhanced liver Pancreas: No abnormality is seen without contrast. Spleen: No abnormality is seen without contrast. A 1.3 cm calcified splenic artery aneurysm is again noted along the lower splenic hilum. Adrenals/Urinary Tract: Chronic perinephric stranding appears similar. There is no adrenal mass. Unremarkable unenhanced renal cortex again noted. There are no urinary stones or obstructive uropathy. There is no bladder thickening. Stomach/Bowel: No dilatation or wall thickening. An appendix is not seen. There is diffuse colonic diverticulosis most advanced in the sigmoid segment but no evidence of focal colitis or diverticulitis. Vascular/Lymphatic: Heavy aortic and branch vessel atherosclerosis. No AAA. No MEF. Reproductive: Status post hysterectomy. No adnexal masses. Other: Pelvic floor laxity is again seen but the prior demonstration of a cystocele is not noted today. There is no free air, free hemorrhage or free fluid. There are no incarcerated hernias. Scattered pelvic phleboliths. Musculoskeletal: There is osteopenia and degenerative change of the lumbar spine. Hemangiomatous replacement L2 vertebral body. There are chronic laminectomy defects at L3 and 4. No regional skeletal fracture is seen. L5 is transitional with bilateral fused sacral articulations. IMPRESSION: 1. No acute intracranial CT findings or depressed skull fractures.  Left lateral high parietal scalp swelling. 2. Motion limited maxillofacial CT with no obvious displaced fractures. Facial laceration lateral right periorbital area. 3. Osteopenia and degenerative  changes without evidence of cervical fractures. 4. Interval C5-6 anterior plate fusion with solid arthrodesis. Old C4-5 mature fusion. 5. No acute trauma related findings in the chest, abdomen or pelvis. 6. Carotid, aortic and coronary artery atherosclerosis. 7. Diverticulosis without diverticulitis. 8. Pelvic floor laxity without evidence of cystocele. 9. Chronic post cholecystectomy intrahepatic and extrahepatic biliary dilatation. Electronically Signed: By: Almira Bar M.D. On: 12/09/2022 23:26   CT MAXILLOFACIAL WO CONTRAST  Addendum Date: 12/10/2022   ADDENDUM REPORT: 12/10/2022 01:54 ADDENDUM: The interval cervical fusion, new from prior studies was actually at C6-7, not at C5-6. Electronically Signed   By: Almira Bar M.D.   On: 12/10/2022 01:54   Result Date: 12/10/2022 CLINICAL DATA:  89 year old with blunt polytrauma, unspecified trauma mechanism. EXAM: CT HEAD WITHOUT CONTRAST CT MAXILLOFACIAL WITHOUT CONTRAST CT CERVICAL SPINE WITHOUT CONTRAST CT CHEST, ABDOMEN AND PELVIS WITHOUT CONTRAST TECHNIQUE: Contiguous axial images were obtained from the base of the skull through the vertex without intravenous contrast. Multidetector CT imaging of the maxillofacial structures was performed. Multiplanar CT image reconstructions were also generated. A small metallic BB was placed on the right temple in order to reliably differentiate right from left. Multidetector CT imaging of the cervical spine was performed without intravenous contrast. Multiplanar CT image reconstructions were also generated. Multidetector CT imaging of the chest, abdomen and pelvis was performed without administration of intravenous contrast. RADIATION DOSE REDUCTION: This exam was performed according to the departmental dose-optimization program which includes automated exposure control, adjustment of the mA and/or kV according to patient size and/or use of iterative reconstruction technique. COMPARISON:  CT scan head 05/05/2022,  MRI cervical spine 04/13/2010, CT abdomen pelvis with contrast 02/14/2016. Chest PA and lateral 12/17/2016 FINDINGS: CT HEAD FINDINGS Brain: There is mild global atrophy for age, mild atrophic ventriculomegaly and small-vessel disease. No infarct, hemorrhage or mass are seen. There is no midline shift. Basal cisterns are clear. Vascular: There are calcifications in the siphons, distal vertebral and basilar arteries. No hyperdense central vessels. Skull: No fracture or focal lesions are seen. There is left lateral high parietal scalp swelling. Other: None. CT MAXILLOFACIAL FINDINGS Osseous: The facial study is motion compromised. There is no obvious displaced fracture but a subtle fracture would be missed. No mandibular dislocation. Orbits: No traumatic or inflammatory finding is seen through the motion artifacts. There are old lens replacements. Sinuses: No significant sinus disease is seen through the motion artifacts. The mastoid air cells are clear. The ostiomeatal complexes are patent. Nasal septum is midline. Soft tissues: There is a skin laceration in the lateral right periorbital area with mild swelling. The epiglottis and tongue base are normal. CT CERVICAL SPINE FINDINGS Alignment: There is a chronic trace degenerative anterolisthesis at C5-6, slight levoscoliosis, and again noted bone-on-bone anterior atlantodental joint space loss with osteophytes and sclerosis of the dens. There is no new or acute traumatic malalignment. Skull base and vertebrae: Old anterior plate fusion Z6-1 with solid arthrodesis is again noted. There has been interval C5-6 anterior plate fusion with solid arthrodesis at this level as well with the facet joints remaining patent. There is osteopenia with no evidence of fractures or primary pathologic process. There are small endplate Schmorl's nodes at C2-3. Soft tissues and spinal canal: No prevertebral fluid or swelling. No visible canal hematoma. There is no laryngeal mass. There is  heavy calcification in the proximal cervical ICAs. There is advanced fatty replacement of the parotid glands, moderate fatty replacement of the submandibular gland. Disc levels: Partial disc space loss noted C2-3 and C3-4. Normal disc height at C5-6 and C7-T1. Fused disc spaces C4-5 and C6-7. Metal artifact from the fusion hardware does limit evaluation of the spinal canal but there is no obvious herniated disc or cord compromise. There is trace marginal endplate ridging at C5-6 but no significant disc osteophyte complex. Facet joint and uncinate spurring is seen at most levels, with foraminal stenosis which is bilaterally severe C3-4, moderate on the right and mild on the left at C4-5, moderate on the right at C5-6. Remaining foramina are clear. Other: None. CT CHEST FINDINGS Cardiovascular: The cardiac size is normal. The coronary arteries are heavily calcified. There is no pericardial effusion. There is mild tortuosity and moderate to heavy calcification in the thoracic aorta, patchy calcific plaques in the great vessels. There is no aortic aneurysm. The pulmonary arteries and veins are normal in caliber. Mediastinum/Nodes: Thyroid gland is atrophic, small in size without mass. No axillary or intrathoracic adenopathy are seen. Small hiatal hernia. There are calcified precarinal lymph nodes to the right. Lungs/Pleura: Calcified granuloma right upper lobe. Reticulated scarring both lung apices symmetrically. The lungs are otherwise clear. There is no pleural effusion, thickening or pneumothorax. Mild chronic elevation right hemidiaphragm. Musculoskeletal: There is osteopenia with degenerative changes of the first spine, mild kyphodextroscoliosis. No spinal compression fractures are seen. The ribs, sternum and visualized shoulder girdles show no displaced fractures. CT ABDOMEN AND PELVIS FINDINGS Hepatobiliary: Chronic postcholecystectomy intrahepatic and extrahepatic bile duct dilatation. The common bile duct is  stable in caliber 16.2 mm. No ductal filling defect is seen. No focal abnormality in the unenhanced liver Pancreas: No abnormality is seen without contrast. Spleen: No abnormality is seen without contrast. A 1.3 cm calcified splenic artery aneurysm is again noted along the lower splenic hilum. Adrenals/Urinary Tract: Chronic perinephric stranding appears similar. There is no adrenal mass. Unremarkable unenhanced renal cortex again noted. There are no urinary stones or obstructive uropathy. There is no bladder thickening. Stomach/Bowel: No dilatation or wall thickening. An appendix is not seen. There is diffuse colonic diverticulosis most advanced in the sigmoid segment but no evidence of focal colitis or diverticulitis. Vascular/Lymphatic: Heavy aortic and branch vessel atherosclerosis. No AAA. No MEF. Reproductive: Status post hysterectomy. No adnexal masses. Other: Pelvic floor laxity is again seen but the prior demonstration of a cystocele is not noted today. There is no free air, free hemorrhage or free fluid. There are no incarcerated hernias. Scattered pelvic phleboliths. Musculoskeletal: There is osteopenia and degenerative change of the lumbar spine. Hemangiomatous replacement L2 vertebral body. There are chronic laminectomy defects at L3 and 4. No regional skeletal fracture is seen. L5 is transitional with bilateral fused sacral articulations. IMPRESSION: 1. No acute intracranial CT findings or depressed skull fractures. Left lateral high parietal scalp swelling. 2. Motion limited maxillofacial CT with no obvious displaced fractures. Facial laceration lateral right periorbital area. 3. Osteopenia and degenerative changes without evidence of cervical fractures. 4. Interval C5-6 anterior plate fusion with solid arthrodesis. Old C4-5 mature fusion. 5. No acute trauma related findings in the chest, abdomen or pelvis. 6. Carotid, aortic and coronary artery atherosclerosis. 7. Diverticulosis without  diverticulitis. 8. Pelvic floor laxity without evidence of cystocele. 9. Chronic post cholecystectomy intrahepatic and extrahepatic biliary dilatation. Electronically Signed: By: Almira Bar M.D. On: 12/09/2022 23:26   CT CERVICAL SPINE  WO CONTRAST  Addendum Date: 12/10/2022   ADDENDUM REPORT: 12/10/2022 01:54 ADDENDUM: The interval cervical fusion, new from prior studies was actually at C6-7, not at C5-6. Electronically Signed   By: Almira Bar M.D.   On: 12/10/2022 01:54   Result Date: 12/10/2022 CLINICAL DATA:  89 year old with blunt polytrauma, unspecified trauma mechanism. EXAM: CT HEAD WITHOUT CONTRAST CT MAXILLOFACIAL WITHOUT CONTRAST CT CERVICAL SPINE WITHOUT CONTRAST CT CHEST, ABDOMEN AND PELVIS WITHOUT CONTRAST TECHNIQUE: Contiguous axial images were obtained from the base of the skull through the vertex without intravenous contrast. Multidetector CT imaging of the maxillofacial structures was performed. Multiplanar CT image reconstructions were also generated. A small metallic BB was placed on the right temple in order to reliably differentiate right from left. Multidetector CT imaging of the cervical spine was performed without intravenous contrast. Multiplanar CT image reconstructions were also generated. Multidetector CT imaging of the chest, abdomen and pelvis was performed without administration of intravenous contrast. RADIATION DOSE REDUCTION: This exam was performed according to the departmental dose-optimization program which includes automated exposure control, adjustment of the mA and/or kV according to patient size and/or use of iterative reconstruction technique. COMPARISON:  CT scan head 05/05/2022, MRI cervical spine 04/13/2010, CT abdomen pelvis with contrast 02/14/2016. Chest PA and lateral 12/17/2016 FINDINGS: CT HEAD FINDINGS Brain: There is mild global atrophy for age, mild atrophic ventriculomegaly and small-vessel disease. No infarct, hemorrhage or mass are seen. There is  no midline shift. Basal cisterns are clear. Vascular: There are calcifications in the siphons, distal vertebral and basilar arteries. No hyperdense central vessels. Skull: No fracture or focal lesions are seen. There is left lateral high parietal scalp swelling. Other: None. CT MAXILLOFACIAL FINDINGS Osseous: The facial study is motion compromised. There is no obvious displaced fracture but a subtle fracture would be missed. No mandibular dislocation. Orbits: No traumatic or inflammatory finding is seen through the motion artifacts. There are old lens replacements. Sinuses: No significant sinus disease is seen through the motion artifacts. The mastoid air cells are clear. The ostiomeatal complexes are patent. Nasal septum is midline. Soft tissues: There is a skin laceration in the lateral right periorbital area with mild swelling. The epiglottis and tongue base are normal. CT CERVICAL SPINE FINDINGS Alignment: There is a chronic trace degenerative anterolisthesis at C5-6, slight levoscoliosis, and again noted bone-on-bone anterior atlantodental joint space loss with osteophytes and sclerosis of the dens. There is no new or acute traumatic malalignment. Skull base and vertebrae: Old anterior plate fusion Z6-1 with solid arthrodesis is again noted. There has been interval C5-6 anterior plate fusion with solid arthrodesis at this level as well with the facet joints remaining patent. There is osteopenia with no evidence of fractures or primary pathologic process. There are small endplate Schmorl's nodes at C2-3. Soft tissues and spinal canal: No prevertebral fluid or swelling. No visible canal hematoma. There is no laryngeal mass. There is heavy calcification in the proximal cervical ICAs. There is advanced fatty replacement of the parotid glands, moderate fatty replacement of the submandibular gland. Disc levels: Partial disc space loss noted C2-3 and C3-4. Normal disc height at C5-6 and C7-T1. Fused disc spaces C4-5  and C6-7. Metal artifact from the fusion hardware does limit evaluation of the spinal canal but there is no obvious herniated disc or cord compromise. There is trace marginal endplate ridging at C5-6 but no significant disc osteophyte complex. Facet joint and uncinate spurring is seen at most levels, with foraminal stenosis which is bilaterally severe  C3-4, moderate on the right and mild on the left at C4-5, moderate on the right at C5-6. Remaining foramina are clear. Other: None. CT CHEST FINDINGS Cardiovascular: The cardiac size is normal. The coronary arteries are heavily calcified. There is no pericardial effusion. There is mild tortuosity and moderate to heavy calcification in the thoracic aorta, patchy calcific plaques in the great vessels. There is no aortic aneurysm. The pulmonary arteries and veins are normal in caliber. Mediastinum/Nodes: Thyroid gland is atrophic, small in size without mass. No axillary or intrathoracic adenopathy are seen. Small hiatal hernia. There are calcified precarinal lymph nodes to the right. Lungs/Pleura: Calcified granuloma right upper lobe. Reticulated scarring both lung apices symmetrically. The lungs are otherwise clear. There is no pleural effusion, thickening or pneumothorax. Mild chronic elevation right hemidiaphragm. Musculoskeletal: There is osteopenia with degenerative changes of the first spine, mild kyphodextroscoliosis. No spinal compression fractures are seen. The ribs, sternum and visualized shoulder girdles show no displaced fractures. CT ABDOMEN AND PELVIS FINDINGS Hepatobiliary: Chronic postcholecystectomy intrahepatic and extrahepatic bile duct dilatation. The common bile duct is stable in caliber 16.2 mm. No ductal filling defect is seen. No focal abnormality in the unenhanced liver Pancreas: No abnormality is seen without contrast. Spleen: No abnormality is seen without contrast. A 1.3 cm calcified splenic artery aneurysm is again noted along the lower  splenic hilum. Adrenals/Urinary Tract: Chronic perinephric stranding appears similar. There is no adrenal mass. Unremarkable unenhanced renal cortex again noted. There are no urinary stones or obstructive uropathy. There is no bladder thickening. Stomach/Bowel: No dilatation or wall thickening. An appendix is not seen. There is diffuse colonic diverticulosis most advanced in the sigmoid segment but no evidence of focal colitis or diverticulitis. Vascular/Lymphatic: Heavy aortic and branch vessel atherosclerosis. No AAA. No MEF. Reproductive: Status post hysterectomy. No adnexal masses. Other: Pelvic floor laxity is again seen but the prior demonstration of a cystocele is not noted today. There is no free air, free hemorrhage or free fluid. There are no incarcerated hernias. Scattered pelvic phleboliths. Musculoskeletal: There is osteopenia and degenerative change of the lumbar spine. Hemangiomatous replacement L2 vertebral body. There are chronic laminectomy defects at L3 and 4. No regional skeletal fracture is seen. L5 is transitional with bilateral fused sacral articulations. IMPRESSION: 1. No acute intracranial CT findings or depressed skull fractures. Left lateral high parietal scalp swelling. 2. Motion limited maxillofacial CT with no obvious displaced fractures. Facial laceration lateral right periorbital area. 3. Osteopenia and degenerative changes without evidence of cervical fractures. 4. Interval C5-6 anterior plate fusion with solid arthrodesis. Old C4-5 mature fusion. 5. No acute trauma related findings in the chest, abdomen or pelvis. 6. Carotid, aortic and coronary artery atherosclerosis. 7. Diverticulosis without diverticulitis. 8. Pelvic floor laxity without evidence of cystocele. 9. Chronic post cholecystectomy intrahepatic and extrahepatic biliary dilatation. Electronically Signed: By: Almira Bar M.D. On: 12/09/2022 23:26   CT CHEST ABDOMEN PELVIS WO CONTRAST  Addendum Date: 12/10/2022    ADDENDUM REPORT: 12/10/2022 01:54 ADDENDUM: The interval cervical fusion, new from prior studies was actually at C6-7, not at C5-6. Electronically Signed   By: Almira Bar M.D.   On: 12/10/2022 01:54   Result Date: 12/10/2022 CLINICAL DATA:  89 year old with blunt polytrauma, unspecified trauma mechanism. EXAM: CT HEAD WITHOUT CONTRAST CT MAXILLOFACIAL WITHOUT CONTRAST CT CERVICAL SPINE WITHOUT CONTRAST CT CHEST, ABDOMEN AND PELVIS WITHOUT CONTRAST TECHNIQUE: Contiguous axial images were obtained from the base of the skull through the vertex without intravenous contrast. Multidetector CT imaging  of the maxillofacial structures was performed. Multiplanar CT image reconstructions were also generated. A small metallic BB was placed on the right temple in order to reliably differentiate right from left. Multidetector CT imaging of the cervical spine was performed without intravenous contrast. Multiplanar CT image reconstructions were also generated. Multidetector CT imaging of the chest, abdomen and pelvis was performed without administration of intravenous contrast. RADIATION DOSE REDUCTION: This exam was performed according to the departmental dose-optimization program which includes automated exposure control, adjustment of the mA and/or kV according to patient size and/or use of iterative reconstruction technique. COMPARISON:  CT scan head 05/05/2022, MRI cervical spine 04/13/2010, CT abdomen pelvis with contrast 02/14/2016. Chest PA and lateral 12/17/2016 FINDINGS: CT HEAD FINDINGS Brain: There is mild global atrophy for age, mild atrophic ventriculomegaly and small-vessel disease. No infarct, hemorrhage or mass are seen. There is no midline shift. Basal cisterns are clear. Vascular: There are calcifications in the siphons, distal vertebral and basilar arteries. No hyperdense central vessels. Skull: No fracture or focal lesions are seen. There is left lateral high parietal scalp swelling. Other: None. CT  MAXILLOFACIAL FINDINGS Osseous: The facial study is motion compromised. There is no obvious displaced fracture but a subtle fracture would be missed. No mandibular dislocation. Orbits: No traumatic or inflammatory finding is seen through the motion artifacts. There are old lens replacements. Sinuses: No significant sinus disease is seen through the motion artifacts. The mastoid air cells are clear. The ostiomeatal complexes are patent. Nasal septum is midline. Soft tissues: There is a skin laceration in the lateral right periorbital area with mild swelling. The epiglottis and tongue base are normal. CT CERVICAL SPINE FINDINGS Alignment: There is a chronic trace degenerative anterolisthesis at C5-6, slight levoscoliosis, and again noted bone-on-bone anterior atlantodental joint space loss with osteophytes and sclerosis of the dens. There is no new or acute traumatic malalignment. Skull base and vertebrae: Old anterior plate fusion Z6-1 with solid arthrodesis is again noted. There has been interval C5-6 anterior plate fusion with solid arthrodesis at this level as well with the facet joints remaining patent. There is osteopenia with no evidence of fractures or primary pathologic process. There are small endplate Schmorl's nodes at C2-3. Soft tissues and spinal canal: No prevertebral fluid or swelling. No visible canal hematoma. There is no laryngeal mass. There is heavy calcification in the proximal cervical ICAs. There is advanced fatty replacement of the parotid glands, moderate fatty replacement of the submandibular gland. Disc levels: Partial disc space loss noted C2-3 and C3-4. Normal disc height at C5-6 and C7-T1. Fused disc spaces C4-5 and C6-7. Metal artifact from the fusion hardware does limit evaluation of the spinal canal but there is no obvious herniated disc or cord compromise. There is trace marginal endplate ridging at C5-6 but no significant disc osteophyte complex. Facet joint and uncinate spurring is  seen at most levels, with foraminal stenosis which is bilaterally severe C3-4, moderate on the right and mild on the left at C4-5, moderate on the right at C5-6. Remaining foramina are clear. Other: None. CT CHEST FINDINGS Cardiovascular: The cardiac size is normal. The coronary arteries are heavily calcified. There is no pericardial effusion. There is mild tortuosity and moderate to heavy calcification in the thoracic aorta, patchy calcific plaques in the great vessels. There is no aortic aneurysm. The pulmonary arteries and veins are normal in caliber. Mediastinum/Nodes: Thyroid gland is atrophic, small in size without mass. No axillary or intrathoracic adenopathy are seen. Small hiatal hernia. There  are calcified precarinal lymph nodes to the right. Lungs/Pleura: Calcified granuloma right upper lobe. Reticulated scarring both lung apices symmetrically. The lungs are otherwise clear. There is no pleural effusion, thickening or pneumothorax. Mild chronic elevation right hemidiaphragm. Musculoskeletal: There is osteopenia with degenerative changes of the first spine, mild kyphodextroscoliosis. No spinal compression fractures are seen. The ribs, sternum and visualized shoulder girdles show no displaced fractures. CT ABDOMEN AND PELVIS FINDINGS Hepatobiliary: Chronic postcholecystectomy intrahepatic and extrahepatic bile duct dilatation. The common bile duct is stable in caliber 16.2 mm. No ductal filling defect is seen. No focal abnormality in the unenhanced liver Pancreas: No abnormality is seen without contrast. Spleen: No abnormality is seen without contrast. A 1.3 cm calcified splenic artery aneurysm is again noted along the lower splenic hilum. Adrenals/Urinary Tract: Chronic perinephric stranding appears similar. There is no adrenal mass. Unremarkable unenhanced renal cortex again noted. There are no urinary stones or obstructive uropathy. There is no bladder thickening. Stomach/Bowel: No dilatation or wall  thickening. An appendix is not seen. There is diffuse colonic diverticulosis most advanced in the sigmoid segment but no evidence of focal colitis or diverticulitis. Vascular/Lymphatic: Heavy aortic and branch vessel atherosclerosis. No AAA. No MEF. Reproductive: Status post hysterectomy. No adnexal masses. Other: Pelvic floor laxity is again seen but the prior demonstration of a cystocele is not noted today. There is no free air, free hemorrhage or free fluid. There are no incarcerated hernias. Scattered pelvic phleboliths. Musculoskeletal: There is osteopenia and degenerative change of the lumbar spine. Hemangiomatous replacement L2 vertebral body. There are chronic laminectomy defects at L3 and 4. No regional skeletal fracture is seen. L5 is transitional with bilateral fused sacral articulations. IMPRESSION: 1. No acute intracranial CT findings or depressed skull fractures. Left lateral high parietal scalp swelling. 2. Motion limited maxillofacial CT with no obvious displaced fractures. Facial laceration lateral right periorbital area. 3. Osteopenia and degenerative changes without evidence of cervical fractures. 4. Interval C5-6 anterior plate fusion with solid arthrodesis. Old C4-5 mature fusion. 5. No acute trauma related findings in the chest, abdomen or pelvis. 6. Carotid, aortic and coronary artery atherosclerosis. 7. Diverticulosis without diverticulitis. 8. Pelvic floor laxity without evidence of cystocele. 9. Chronic post cholecystectomy intrahepatic and extrahepatic biliary dilatation. Electronically Signed: By: Almira Bar M.D. On: 12/09/2022 23:26   DG Forearm Left  Result Date: 12/10/2022 CLINICAL DATA:  Fall forearm pain EXAM: LEFT FOREARM - 2 VIEW COMPARISON:  None Available. FINDINGS: No fracture or malalignment. Degenerative changes at the first [facility] joint. IMPRESSION: No acute osseous abnormality. Electronically Signed   By: Jasmine Pang M.D.   On: 12/10/2022 00:27   CT T-SPINE NO  CHARGE  Result Date: 12/09/2022 CLINICAL DATA:  Trauma EXAM: CT Thoracic Spine without contrast TECHNIQUE: Multiplanar CT images of the thoracic spine were reconstructed from contemporary CT of the Chest. RADIATION DOSE REDUCTION: This exam was performed according to the departmental dose-optimization program which includes automated exposure control, adjustment of the mA and/or kV according to patient size and/or use of iterative reconstruction technique. CONTRAST:  None or No additional COMPARISON:  None Available. FINDINGS: Alignment: Thoracic alignment within normal limits. Vertebrae: Vertebral bodies demonstrate normal stature. No fracture. Paraspinal and other soft tissues: Negative. Disc levels: Partially visualized anterior fusion hardware in the lower cervical spine. Multilevel degenerative osteophytes of the thoracic spine. No abnormal disc space widening. IMPRESSION: Degenerative changes of the thoracic spine. No acute osseous abnormality. Electronically Signed   By: Jasmine Pang M.D.   On:  12/09/2022 23:03   DG Wrist Complete Right  Result Date: 12/09/2022 CLINICAL DATA:  Fall EXAM: RIGHT WRIST - COMPLETE 3+ VIEW COMPARISON:  None Available. FINDINGS: No fracture or malalignment. Mild degenerative change at the first [facility] joint. Soft tissue swelling greatest adjacent to the ulna. IMPRESSION: No acute osseous abnormality. Soft tissue swelling. Electronically Signed   By: Jasmine Pang M.D.   On: 12/09/2022 21:34   DG Tibia/Fibula Right  Result Date: 12/09/2022 CLINICAL DATA:  Fall EXAM: RIGHT TIBIA AND FIBULA - 2 VIEW COMPARISON:  12/17/2016 FINDINGS: No fracture or malalignment. Vascular calcifications. Soft tissue swelling. IMPRESSION: No acute osseous abnormality. Electronically Signed   By: Jasmine Pang M.D.   On: 12/09/2022 21:33   DG Tibia/Fibula Left  Result Date: 12/09/2022 CLINICAL DATA:  Fall EXAM: LEFT TIBIA AND FIBULA - 2 VIEW COMPARISON:  11/14/2010, 11/10/2010 FINDINGS: Surgical  plate and fixating screws in the proximal tibia. No acute displaced fracture or malalignment. Generalized soft tissue edema. Old tibial plateau fractures IMPRESSION: Postsurgical changes of the proximal tibia with old tibial plateau fractures. No acute osseous abnormality. Electronically Signed   By: Jasmine Pang M.D.   On: 12/09/2022 21:32   DG Elbow 2 Views Right  Result Date: 12/09/2022 CLINICAL DATA:  Fall EXAM: RIGHT ELBOW - 2 VIEW COMPARISON:  None Available. FINDINGS: There is no evidence of fracture, dislocation, or joint effusion. There is no evidence of arthropathy or other focal bone abnormality. Soft tissues are unremarkable. IMPRESSION: Negative. Electronically Signed   By: Darliss Cheney M.D.   On: 12/09/2022 21:30    Pending Labs Unresulted Labs (From admission, onward)     Start     Ordered   12/17/22 0500  Creatinine, serum  (enoxaparin (LOVENOX)    CrCl >/= 30 ml/min)  Weekly,   R     Comments: while on enoxaparin therapy    12/10/22 0137            Vitals/Pain Today's Vitals   12/09/22 2015 12/09/22 2203 12/09/22 2234 12/10/22 0221  BP: (!) 187/58  (!) 170/90   Pulse: 60  68   Resp: 18  12   Temp:   98 F (36.7 C) 98.1 F (36.7 C)  TempSrc:   Oral Oral  SpO2: 99%  100%   Weight:      PainSc:  8  9      Isolation Precautions No active isolations  Medications Medications  acetaminophen (TYLENOL) tablet 650 mg (has no administration in time range)    Or  acetaminophen (TYLENOL) suppository 650 mg (has no administration in time range)  traZODone (DESYREL) tablet 25 mg (25 mg Oral Given 12/10/22 0202)  docusate sodium (COLACE) capsule 100 mg (100 mg Oral Patient Refused/Not Given 12/10/22 0204)  enoxaparin (LOVENOX) injection 30 mg (has no administration in time range)  cefTRIAXone (ROCEPHIN) 2 g in sodium chloride 0.9 % 100 mL IVPB (has no administration in time range)  amLODipine (NORVASC) tablet 10 mg (has no administration in time range)  levothyroxine  (SYNTHROID) tablet 150 mcg (has no administration in time range)  memantine (NAMENDA) tablet 10 mg (has no administration in time range)  multivitamin with minerals tablet 1 tablet (has no administration in time range)  multivitamin (PROSIGHT) tablet 1 tablet (has no administration in time range)  pantoprazole (PROTONIX) EC tablet 40 mg (has no administration in time range)  irbesartan (AVAPRO) tablet 37.5 mg (has no administration in time range)  sertraline (ZOLOFT) tablet 100 mg (has no  administration in time range)  QUEtiapine (SEROQUEL) tablet 50 mg (50 mg Oral Given 12/10/22 0341)  polyvinyl alcohol (LIQUIFILM TEARS) 1.4 % ophthalmic solution 1 drop (has no administration in time range)  polyethylene glycol (MIRALAX / GLYCOLAX) packet 17 g (has no administration in time range)  prochlorperazine (COMPAZINE) injection 5 mg (has no administration in time range)  donepezil (ARICEPT) tablet 10 mg (has no administration in time range)  Tdap (BOOSTRIX) injection 0.5 mL (0.5 mLs Intramuscular Given 12/09/22 2138)  morphine (PF) 4 MG/ML injection 4 mg (4 mg Intravenous Given 12/09/22 2131)  cefTRIAXone (ROCEPHIN) 1 g in sodium chloride 0.9 % 100 mL IVPB (0 g Intravenous Stopped 12/10/22 0001)  lidocaine-EPINEPHrine-tetracaine (LET) topical gel (3 mLs Topical Given 12/09/22 2324)  lidocaine-EPINEPHrine (XYLOCAINE W/EPI) 2 %-1:200000 (PF) injection 10 mL (10 mLs Intradermal Given 12/09/22 2323)  sodium chloride 0.9 % bolus 500 mL (0 mLs Intravenous Stopped 12/10/22 0131)  morphine (PF) 4 MG/ML injection 4 mg (4 mg Intravenous Given 12/10/22 0204)  acetaminophen (TYLENOL) tablet 1,000 mg (1,000 mg Oral Given 12/10/22 0202)    Mobility walks with person assist     Focused Assessments Neuro Assessment Handoff:  Swallow screen pass?  N/A         Neuro Assessment:   Neuro Checks:      Has TPA been given? No If patient is a Neuro Trauma and patient is going to OR before floor call report to 4N Charge nurse:  [phone] or [phone]   R Recommendations: See Admitting Provider Note  Report given to:   Additional Notes: pt refusing female staff members, able to walk but unsteady due to dizzyness

## 2022-12-10 NOTE — ED Notes (Signed)
ED TO INPATIENT HANDOFF REPORT  ED Nurse Name and Phone #:  Cheral Almas, RN  [phone]  S Name/Age/Gender [name] 89 y.o. female Room/Bed: 027C/027C  Code Status   Code Status: Full Code  Home/SNF/Other Home Patient oriented to: self Is this baseline? Yes   Triage Complete: Triage complete  Chief Complaint Fall [W19.XXXA]  Triage Note BIB EMS/ pt fell going down a step down into garage landing on concrete floor / pt injured right side of forehead/ bleeding controlled/ right elbow skin tear/ bil knee pain/ left upper forearm skin tear/ small lac to nose/ bil hip pain/ denies LOC/ denies thinners   Allergies Allergies  Allergen Reactions   Oxycodone Hcl Itching    Level of Care/Admitting Diagnosis ED Disposition     ED Disposition  Admit   Condition  --   Comment  Hospital Area: [facility] [[id number]]  Level of Care: Telemetry Surgical [105]  May admit patient to [facility] or [facility] if equivalent level of care is available:: Yes  Covid Evaluation: Asymptomatic - no recent exposure (last 10 days) testing not required  Diagnosis: Fall [290176]  Admitting Physician: Darlin Drop [[id number]]  Attending Physician: Darlin Drop [[id number]]  Certification:: I certify this patient will need inpatient services for at least 2 midnights  Estimated Length of Stay: 2          B Medical/Surgery History Past Medical History:  Diagnosis Date   Anemia    Chronic fatigue    Chronic renal insufficiency, stage III (moderate) (HCC)    Depression    HYPERTENSION 04/04/2007   HYPOTHYROIDISM 04/04/2007   Lewy body dementia (HCC)    Macular degeneration    Past Surgical History:  Procedure Laterality Date   ABDOMINAL HYSTERECTOMY     APPENDECTOMY     BACK SURGERY  2012   Cataracts     CHOLECYSTECTOMY     ERCP N/A 11/22/2014   Procedure: ENDOSCOPIC RETROGRADE CHOLANGIOPANCREATOGRAPHY (ERCP);  Surgeon: Vida Rigger, MD;  Location: [facility] ENDOSCOPY;   Service: Endoscopy;  Laterality: N/A;   HERNIA REPAIR     OOPHORECTOMY     ORIF TIBIA FRACTURE  2012   dr Lajoyce Corners   [facility] SURGERY  2011   cervical     A IV Location/Drains/Wounds Patient Lines/Drains/Airways Status     Active Line/Drains/Airways     Name Placement date Placement time Site Days   Peripheral IV 12/09/22 20 G Right Antecubital 12/09/22  2121  Antecubital  1            Intake/Output Last 24 hours No intake or output data in the 24 hours ending 12/10/22 0151  Labs/Imaging Results for orders placed or performed during the hospital encounter of 12/09/22 (from the past 48 hour(s))  Urinalysis, Routine w reflex microscopic -Urine, Clean Catch     Status: Abnormal   Collection Time: 12/09/22  8:50 PM  Result Value Ref Range   Color, Urine YELLOW YELLOW   APPearance HAZY (A) CLEAR   Specific Gravity, Urine 1.011 1.005 - 1.030   pH 5.0 5.0 - 8.0   Glucose, UA NEGATIVE NEGATIVE mg/dL   Hgb urine dipstick NEGATIVE NEGATIVE   Bilirubin Urine NEGATIVE NEGATIVE   Ketones, ur NEGATIVE NEGATIVE mg/dL   Protein, ur 956 (A) NEGATIVE mg/dL   Nitrite NEGATIVE NEGATIVE   Leukocytes,Ua MODERATE (A) NEGATIVE   RBC / HPF 0-5 0 - 5 RBC/hpf   WBC, UA 21-50 0 - 5 WBC/hpf  Bacteria, UA NONE SEEN NONE SEEN   Squamous Epithelial / HPF 0-5 0 - 5 /HPF   Mucus PRESENT    Hyaline Casts, UA PRESENT     Comment: Performed at [facility], [address] [place], [place] [address]  Basic metabolic panel     Status: Abnormal   Collection Time: 12/09/22  9:39 PM  Result Value Ref Range   Sodium 133 (L) 135 - 145 mmol/L   Potassium 4.7 3.5 - 5.1 mmol/L   Chloride 103 98 - 111 mmol/L   CO2 21 (L) 22 - 32 mmol/L   Glucose, Bld 127 (H) 70 - 99 mg/dL    Comment: Glucose reference range applies only to samples taken after fasting for at least 8 hours.   BUN 17 8 - 23 mg/dL   Creatinine, Ser 4.40 (H) 0.44 - 1.00 mg/dL   Calcium 8.9 8.9 - 10.2 mg/dL   GFR, Estimated 32 (L) >60  mL/min    Comment: (NOTE) Calculated using the CKD-EPI Creatinine Equation (2021)    Anion gap 9 5 - 15    Comment: Performed at [facility], [address], [place], [place] [address]  CBC     Status: Abnormal   Collection Time: 12/09/22  9:39 PM  Result Value Ref Range   WBC 12.2 (H) 4.0 - 10.5 K/uL   RBC 3.09 (L) 3.87 - 5.11 MIL/uL   Hemoglobin 9.8 (L) 12.0 - 15.0 g/dL   HCT 64.4 (L) 03.4 - 74.2 %   MCV 94.5 80.0 - 100.0 fL   MCH 31.7 26.0 - 34.0 pg   MCHC 33.6 30.0 - 36.0 g/dL   RDW 59.5 63.8 - 75.6 %   Platelets 271 150 - 400 K/uL   nRBC 0.0 0.0 - 0.2 %    Comment: Performed at [facility], [address], [place], [place] [address]  CK     Status: None   Collection Time: 12/09/22  9:39 PM  Result Value Ref Range   Total CK 67 38 - 234 U/L    Comment: Performed at [facility], [address], [place], [place] [address]  CBG monitoring, ED     Status: Abnormal   Collection Time: 12/09/22  9:48 PM  Result Value Ref Range   Glucose-Capillary 115 (H) 70 - 99 mg/dL    Comment: Glucose reference range applies only to samples taken after fasting for at least 8 hours.   DG Forearm Left  Result Date: 12/10/2022 CLINICAL DATA:  Fall forearm pain EXAM: LEFT FOREARM - 2 VIEW COMPARISON:  None Available. FINDINGS: No fracture or malalignment. Degenerative changes at the first [facility] joint. IMPRESSION: No acute osseous abnormality. Electronically Signed   By: Jasmine Pang M.D.   On: 12/10/2022 00:27   CT HEAD WO CONTRAST  Result Date: 12/09/2022 CLINICAL DATA:  89 year old with blunt polytrauma, unspecified trauma mechanism. EXAM: CT HEAD WITHOUT CONTRAST CT MAXILLOFACIAL WITHOUT CONTRAST CT CERVICAL SPINE WITHOUT CONTRAST CT CHEST, ABDOMEN AND PELVIS WITHOUT CONTRAST TECHNIQUE: Contiguous axial images were obtained from the base of the skull through the vertex without intravenous contrast. Multidetector CT imaging of the maxillofacial structures was performed.  Multiplanar CT image reconstructions were also generated. A small metallic BB was placed on the right temple in order to reliably differentiate right from left. Multidetector CT imaging of the cervical spine was performed without intravenous contrast. Multiplanar CT image reconstructions were also generated. Multidetector CT imaging of the chest,  abdomen and pelvis was performed without administration of intravenous contrast. RADIATION DOSE REDUCTION: This exam was performed according to the departmental dose-optimization program which includes automated exposure control, adjustment of the mA and/or kV according to patient size and/or use of iterative reconstruction technique. COMPARISON:  CT scan head 05/05/2022, MRI cervical spine 04/13/2010, CT abdomen pelvis with contrast 02/14/2016. Chest PA and lateral 12/17/2016 FINDINGS: CT HEAD FINDINGS Brain: There is mild global atrophy for age, mild atrophic ventriculomegaly and small-vessel disease. No infarct, hemorrhage or mass are seen. There is no midline shift. Basal cisterns are clear. Vascular: There are calcifications in the siphons, distal vertebral and basilar arteries. No hyperdense central vessels. Skull: No fracture or focal lesions are seen. There is left lateral high parietal scalp swelling. Other: None. CT MAXILLOFACIAL FINDINGS Osseous: The facial study is motion compromised. There is no obvious displaced fracture but a subtle fracture would be missed. No mandibular dislocation. Orbits: No traumatic or inflammatory finding is seen through the motion artifacts. There are old lens replacements. Sinuses: No significant sinus disease is seen through the motion artifacts. The mastoid air cells are clear. The ostiomeatal complexes are patent. Nasal septum is midline. Soft tissues: There is a skin laceration in the lateral right periorbital area with mild swelling. The epiglottis and tongue base are normal. CT CERVICAL SPINE FINDINGS Alignment: There is a  chronic trace degenerative anterolisthesis at C5-6, slight levoscoliosis, and again noted bone-on-bone anterior atlantodental joint space loss with osteophytes and sclerosis of the dens. There is no new or acute traumatic malalignment. Skull base and vertebrae: Old anterior plate fusion Z6-1 with solid arthrodesis is again noted. There has been interval C5-6 anterior plate fusion with solid arthrodesis at this level as well with the facet joints remaining patent. There is osteopenia with no evidence of fractures or primary pathologic process. There are small endplate Schmorl's nodes at C2-3. Soft tissues and spinal canal: No prevertebral fluid or swelling. No visible canal hematoma. There is no laryngeal mass. There is heavy calcification in the proximal cervical ICAs. There is advanced fatty replacement of the parotid glands, moderate fatty replacement of the submandibular gland. Disc levels: Partial disc space loss noted C2-3 and C3-4. Normal disc height at C5-6 and C7-T1. Fused disc spaces C4-5 and C6-7. Metal artifact from the fusion hardware does limit evaluation of the spinal canal but there is no obvious herniated disc or cord compromise. There is trace marginal endplate ridging at C5-6 but no significant disc osteophyte complex. Facet joint and uncinate spurring is seen at most levels, with foraminal stenosis which is bilaterally severe C3-4, moderate on the right and mild on the left at C4-5, moderate on the right at C5-6. Remaining foramina are clear. Other: None. CT CHEST FINDINGS Cardiovascular: The cardiac size is normal. The coronary arteries are heavily calcified. There is no pericardial effusion. There is mild tortuosity and moderate to heavy calcification in the thoracic aorta, patchy calcific plaques in the great vessels. There is no aortic aneurysm. The pulmonary arteries and veins are normal in caliber. Mediastinum/Nodes: Thyroid gland is atrophic, small in size without mass. No axillary or  intrathoracic adenopathy are seen. Small hiatal hernia. There are calcified precarinal lymph nodes to the right. Lungs/Pleura: Calcified granuloma right upper lobe. Reticulated scarring both lung apices symmetrically. The lungs are otherwise clear. There is no pleural effusion, thickening or pneumothorax. Mild chronic elevation right hemidiaphragm. Musculoskeletal: There is osteopenia with degenerative changes of the first spine, mild kyphodextroscoliosis. No spinal compression fractures are  seen. The ribs, sternum and visualized shoulder girdles show no displaced fractures. CT ABDOMEN AND PELVIS FINDINGS Hepatobiliary: Chronic postcholecystectomy intrahepatic and extrahepatic bile duct dilatation. The common bile duct is stable in caliber 16.2 mm. No ductal filling defect is seen. No focal abnormality in the unenhanced liver Pancreas: No abnormality is seen without contrast. Spleen: No abnormality is seen without contrast. A 1.3 cm calcified splenic artery aneurysm is again noted along the lower splenic hilum. Adrenals/Urinary Tract: Chronic perinephric stranding appears similar. There is no adrenal mass. Unremarkable unenhanced renal cortex again noted. There are no urinary stones or obstructive uropathy. There is no bladder thickening. Stomach/Bowel: No dilatation or wall thickening. An appendix is not seen. There is diffuse colonic diverticulosis most advanced in the sigmoid segment but no evidence of focal colitis or diverticulitis. Vascular/Lymphatic: Heavy aortic and branch vessel atherosclerosis. No AAA. No MEF. Reproductive: Status post hysterectomy. No adnexal masses. Other: Pelvic floor laxity is again seen but the prior demonstration of a cystocele is not noted today. There is no free air, free hemorrhage or free fluid. There are no incarcerated hernias. Scattered pelvic phleboliths. Musculoskeletal: There is osteopenia and degenerative change of the lumbar spine. Hemangiomatous replacement L2 vertebral  body. There are chronic laminectomy defects at L3 and 4. No regional skeletal fracture is seen. L5 is transitional with bilateral fused sacral articulations. IMPRESSION: 1. No acute intracranial CT findings or depressed skull fractures. Left lateral high parietal scalp swelling. 2. Motion limited maxillofacial CT with no obvious displaced fractures. Facial laceration lateral right periorbital area. 3. Osteopenia and degenerative changes without evidence of cervical fractures. 4. Interval C5-6 anterior plate fusion with solid arthrodesis. Old C4-5 mature fusion. 5. No acute trauma related findings in the chest, abdomen or pelvis. 6. Carotid, aortic and coronary artery atherosclerosis. 7. Diverticulosis without diverticulitis. 8. Pelvic floor laxity without evidence of cystocele. 9. Chronic post cholecystectomy intrahepatic and extrahepatic biliary dilatation. Electronically Signed   By: Almira Bar M.D.   On: 12/09/2022 23:26   CT MAXILLOFACIAL WO CONTRAST  Result Date: 12/09/2022 CLINICAL DATA:  89 year old with blunt polytrauma, unspecified trauma mechanism. EXAM: CT HEAD WITHOUT CONTRAST CT MAXILLOFACIAL WITHOUT CONTRAST CT CERVICAL SPINE WITHOUT CONTRAST CT CHEST, ABDOMEN AND PELVIS WITHOUT CONTRAST TECHNIQUE: Contiguous axial images were obtained from the base of the skull through the vertex without intravenous contrast. Multidetector CT imaging of the maxillofacial structures was performed. Multiplanar CT image reconstructions were also generated. A small metallic BB was placed on the right temple in order to reliably differentiate right from left. Multidetector CT imaging of the cervical spine was performed without intravenous contrast. Multiplanar CT image reconstructions were also generated. Multidetector CT imaging of the chest, abdomen and pelvis was performed without administration of intravenous contrast. RADIATION DOSE REDUCTION: This exam was performed according to the departmental  dose-optimization program which includes automated exposure control, adjustment of the mA and/or kV according to patient size and/or use of iterative reconstruction technique. COMPARISON:  CT scan head 05/05/2022, MRI cervical spine 04/13/2010, CT abdomen pelvis with contrast 02/14/2016. Chest PA and lateral 12/17/2016 FINDINGS: CT HEAD FINDINGS Brain: There is mild global atrophy for age, mild atrophic ventriculomegaly and small-vessel disease. No infarct, hemorrhage or mass are seen. There is no midline shift. Basal cisterns are clear. Vascular: There are calcifications in the siphons, distal vertebral and basilar arteries. No hyperdense central vessels. Skull: No fracture or focal lesions are seen. There is left lateral high parietal scalp swelling. Other: None. CT MAXILLOFACIAL FINDINGS Osseous: The  facial study is motion compromised. There is no obvious displaced fracture but a subtle fracture would be missed. No mandibular dislocation. Orbits: No traumatic or inflammatory finding is seen through the motion artifacts. There are old lens replacements. Sinuses: No significant sinus disease is seen through the motion artifacts. The mastoid air cells are clear. The ostiomeatal complexes are patent. Nasal septum is midline. Soft tissues: There is a skin laceration in the lateral right periorbital area with mild swelling. The epiglottis and tongue base are normal. CT CERVICAL SPINE FINDINGS Alignment: There is a chronic trace degenerative anterolisthesis at C5-6, slight levoscoliosis, and again noted bone-on-bone anterior atlantodental joint space loss with osteophytes and sclerosis of the dens. There is no new or acute traumatic malalignment. Skull base and vertebrae: Old anterior plate fusion Z6-1 with solid arthrodesis is again noted. There has been interval C5-6 anterior plate fusion with solid arthrodesis at this level as well with the facet joints remaining patent. There is osteopenia with no evidence of  fractures or primary pathologic process. There are small endplate Schmorl's nodes at C2-3. Soft tissues and spinal canal: No prevertebral fluid or swelling. No visible canal hematoma. There is no laryngeal mass. There is heavy calcification in the proximal cervical ICAs. There is advanced fatty replacement of the parotid glands, moderate fatty replacement of the submandibular gland. Disc levels: Partial disc space loss noted C2-3 and C3-4. Normal disc height at C5-6 and C7-T1. Fused disc spaces C4-5 and C6-7. Metal artifact from the fusion hardware does limit evaluation of the spinal canal but there is no obvious herniated disc or cord compromise. There is trace marginal endplate ridging at C5-6 but no significant disc osteophyte complex. Facet joint and uncinate spurring is seen at most levels, with foraminal stenosis which is bilaterally severe C3-4, moderate on the right and mild on the left at C4-5, moderate on the right at C5-6. Remaining foramina are clear. Other: None. CT CHEST FINDINGS Cardiovascular: The cardiac size is normal. The coronary arteries are heavily calcified. There is no pericardial effusion. There is mild tortuosity and moderate to heavy calcification in the thoracic aorta, patchy calcific plaques in the great vessels. There is no aortic aneurysm. The pulmonary arteries and veins are normal in caliber. Mediastinum/Nodes: Thyroid gland is atrophic, small in size without mass. No axillary or intrathoracic adenopathy are seen. Small hiatal hernia. There are calcified precarinal lymph nodes to the right. Lungs/Pleura: Calcified granuloma right upper lobe. Reticulated scarring both lung apices symmetrically. The lungs are otherwise clear. There is no pleural effusion, thickening or pneumothorax. Mild chronic elevation right hemidiaphragm. Musculoskeletal: There is osteopenia with degenerative changes of the first spine, mild kyphodextroscoliosis. No spinal compression fractures are seen. The ribs,  sternum and visualized shoulder girdles show no displaced fractures. CT ABDOMEN AND PELVIS FINDINGS Hepatobiliary: Chronic postcholecystectomy intrahepatic and extrahepatic bile duct dilatation. The common bile duct is stable in caliber 16.2 mm. No ductal filling defect is seen. No focal abnormality in the unenhanced liver Pancreas: No abnormality is seen without contrast. Spleen: No abnormality is seen without contrast. A 1.3 cm calcified splenic artery aneurysm is again noted along the lower splenic hilum. Adrenals/Urinary Tract: Chronic perinephric stranding appears similar. There is no adrenal mass. Unremarkable unenhanced renal cortex again noted. There are no urinary stones or obstructive uropathy. There is no bladder thickening. Stomach/Bowel: No dilatation or wall thickening. An appendix is not seen. There is diffuse colonic diverticulosis most advanced in the sigmoid segment but no evidence of focal colitis or  diverticulitis. Vascular/Lymphatic: Heavy aortic and branch vessel atherosclerosis. No AAA. No MEF. Reproductive: Status post hysterectomy. No adnexal masses. Other: Pelvic floor laxity is again seen but the prior demonstration of a cystocele is not noted today. There is no free air, free hemorrhage or free fluid. There are no incarcerated hernias. Scattered pelvic phleboliths. Musculoskeletal: There is osteopenia and degenerative change of the lumbar spine. Hemangiomatous replacement L2 vertebral body. There are chronic laminectomy defects at L3 and 4. No regional skeletal fracture is seen. L5 is transitional with bilateral fused sacral articulations. IMPRESSION: 1. No acute intracranial CT findings or depressed skull fractures. Left lateral high parietal scalp swelling. 2. Motion limited maxillofacial CT with no obvious displaced fractures. Facial laceration lateral right periorbital area. 3. Osteopenia and degenerative changes without evidence of cervical fractures. 4. Interval C5-6 anterior plate  fusion with solid arthrodesis. Old C4-5 mature fusion. 5. No acute trauma related findings in the chest, abdomen or pelvis. 6. Carotid, aortic and coronary artery atherosclerosis. 7. Diverticulosis without diverticulitis. 8. Pelvic floor laxity without evidence of cystocele. 9. Chronic post cholecystectomy intrahepatic and extrahepatic biliary dilatation. Electronically Signed   By: Almira Bar M.D.   On: 12/09/2022 23:26   CT CERVICAL SPINE WO CONTRAST  Result Date: 12/09/2022 CLINICAL DATA:  89 year old with blunt polytrauma, unspecified trauma mechanism. EXAM: CT HEAD WITHOUT CONTRAST CT MAXILLOFACIAL WITHOUT CONTRAST CT CERVICAL SPINE WITHOUT CONTRAST CT CHEST, ABDOMEN AND PELVIS WITHOUT CONTRAST TECHNIQUE: Contiguous axial images were obtained from the base of the skull through the vertex without intravenous contrast. Multidetector CT imaging of the maxillofacial structures was performed. Multiplanar CT image reconstructions were also generated. A small metallic BB was placed on the right temple in order to reliably differentiate right from left. Multidetector CT imaging of the cervical spine was performed without intravenous contrast. Multiplanar CT image reconstructions were also generated. Multidetector CT imaging of the chest, abdomen and pelvis was performed without administration of intravenous contrast. RADIATION DOSE REDUCTION: This exam was performed according to the departmental dose-optimization program which includes automated exposure control, adjustment of the mA and/or kV according to patient size and/or use of iterative reconstruction technique. COMPARISON:  CT scan head 05/05/2022, MRI cervical spine 04/13/2010, CT abdomen pelvis with contrast 02/14/2016. Chest PA and lateral 12/17/2016 FINDINGS: CT HEAD FINDINGS Brain: There is mild global atrophy for age, mild atrophic ventriculomegaly and small-vessel disease. No infarct, hemorrhage or mass are seen. There is no midline shift. Basal  cisterns are clear. Vascular: There are calcifications in the siphons, distal vertebral and basilar arteries. No hyperdense central vessels. Skull: No fracture or focal lesions are seen. There is left lateral high parietal scalp swelling. Other: None. CT MAXILLOFACIAL FINDINGS Osseous: The facial study is motion compromised. There is no obvious displaced fracture but a subtle fracture would be missed. No mandibular dislocation. Orbits: No traumatic or inflammatory finding is seen through the motion artifacts. There are old lens replacements. Sinuses: No significant sinus disease is seen through the motion artifacts. The mastoid air cells are clear. The ostiomeatal complexes are patent. Nasal septum is midline. Soft tissues: There is a skin laceration in the lateral right periorbital area with mild swelling. The epiglottis and tongue base are normal. CT CERVICAL SPINE FINDINGS Alignment: There is a chronic trace degenerative anterolisthesis at C5-6, slight levoscoliosis, and again noted bone-on-bone anterior atlantodental joint space loss with osteophytes and sclerosis of the dens. There is no new or acute traumatic malalignment. Skull base and vertebrae: Old anterior plate fusion A5-4 with  solid arthrodesis is again noted. There has been interval C5-6 anterior plate fusion with solid arthrodesis at this level as well with the facet joints remaining patent. There is osteopenia with no evidence of fractures or primary pathologic process. There are small endplate Schmorl's nodes at C2-3. Soft tissues and spinal canal: No prevertebral fluid or swelling. No visible canal hematoma. There is no laryngeal mass. There is heavy calcification in the proximal cervical ICAs. There is advanced fatty replacement of the parotid glands, moderate fatty replacement of the submandibular gland. Disc levels: Partial disc space loss noted C2-3 and C3-4. Normal disc height at C5-6 and C7-T1. Fused disc spaces C4-5 and C6-7. Metal artifact  from the fusion hardware does limit evaluation of the spinal canal but there is no obvious herniated disc or cord compromise. There is trace marginal endplate ridging at C5-6 but no significant disc osteophyte complex. Facet joint and uncinate spurring is seen at most levels, with foraminal stenosis which is bilaterally severe C3-4, moderate on the right and mild on the left at C4-5, moderate on the right at C5-6. Remaining foramina are clear. Other: None. CT CHEST FINDINGS Cardiovascular: The cardiac size is normal. The coronary arteries are heavily calcified. There is no pericardial effusion. There is mild tortuosity and moderate to heavy calcification in the thoracic aorta, patchy calcific plaques in the great vessels. There is no aortic aneurysm. The pulmonary arteries and veins are normal in caliber. Mediastinum/Nodes: Thyroid gland is atrophic, small in size without mass. No axillary or intrathoracic adenopathy are seen. Small hiatal hernia. There are calcified precarinal lymph nodes to the right. Lungs/Pleura: Calcified granuloma right upper lobe. Reticulated scarring both lung apices symmetrically. The lungs are otherwise clear. There is no pleural effusion, thickening or pneumothorax. Mild chronic elevation right hemidiaphragm. Musculoskeletal: There is osteopenia with degenerative changes of the first spine, mild kyphodextroscoliosis. No spinal compression fractures are seen. The ribs, sternum and visualized shoulder girdles show no displaced fractures. CT ABDOMEN AND PELVIS FINDINGS Hepatobiliary: Chronic postcholecystectomy intrahepatic and extrahepatic bile duct dilatation. The common bile duct is stable in caliber 16.2 mm. No ductal filling defect is seen. No focal abnormality in the unenhanced liver Pancreas: No abnormality is seen without contrast. Spleen: No abnormality is seen without contrast. A 1.3 cm calcified splenic artery aneurysm is again noted along the lower splenic hilum. Adrenals/Urinary  Tract: Chronic perinephric stranding appears similar. There is no adrenal mass. Unremarkable unenhanced renal cortex again noted. There are no urinary stones or obstructive uropathy. There is no bladder thickening. Stomach/Bowel: No dilatation or wall thickening. An appendix is not seen. There is diffuse colonic diverticulosis most advanced in the sigmoid segment but no evidence of focal colitis or diverticulitis. Vascular/Lymphatic: Heavy aortic and branch vessel atherosclerosis. No AAA. No MEF. Reproductive: Status post hysterectomy. No adnexal masses. Other: Pelvic floor laxity is again seen but the prior demonstration of a cystocele is not noted today. There is no free air, free hemorrhage or free fluid. There are no incarcerated hernias. Scattered pelvic phleboliths. Musculoskeletal: There is osteopenia and degenerative change of the lumbar spine. Hemangiomatous replacement L2 vertebral body. There are chronic laminectomy defects at L3 and 4. No regional skeletal fracture is seen. L5 is transitional with bilateral fused sacral articulations. IMPRESSION: 1. No acute intracranial CT findings or depressed skull fractures. Left lateral high parietal scalp swelling. 2. Motion limited maxillofacial CT with no obvious displaced fractures. Facial laceration lateral right periorbital area. 3. Osteopenia and degenerative changes without evidence of cervical fractures.  4. Interval C5-6 anterior plate fusion with solid arthrodesis. Old C4-5 mature fusion. 5. No acute trauma related findings in the chest, abdomen or pelvis. 6. Carotid, aortic and coronary artery atherosclerosis. 7. Diverticulosis without diverticulitis. 8. Pelvic floor laxity without evidence of cystocele. 9. Chronic post cholecystectomy intrahepatic and extrahepatic biliary dilatation. Electronically Signed   By: Almira Bar M.D.   On: 12/09/2022 23:26   CT CHEST ABDOMEN PELVIS WO CONTRAST  Result Date: 12/09/2022 CLINICAL DATA:  89 year old with  blunt polytrauma, unspecified trauma mechanism. EXAM: CT HEAD WITHOUT CONTRAST CT MAXILLOFACIAL WITHOUT CONTRAST CT CERVICAL SPINE WITHOUT CONTRAST CT CHEST, ABDOMEN AND PELVIS WITHOUT CONTRAST TECHNIQUE: Contiguous axial images were obtained from the base of the skull through the vertex without intravenous contrast. Multidetector CT imaging of the maxillofacial structures was performed. Multiplanar CT image reconstructions were also generated. A small metallic BB was placed on the right temple in order to reliably differentiate right from left. Multidetector CT imaging of the cervical spine was performed without intravenous contrast. Multiplanar CT image reconstructions were also generated. Multidetector CT imaging of the chest, abdomen and pelvis was performed without administration of intravenous contrast. RADIATION DOSE REDUCTION: This exam was performed according to the departmental dose-optimization program which includes automated exposure control, adjustment of the mA and/or kV according to patient size and/or use of iterative reconstruction technique. COMPARISON:  CT scan head 05/05/2022, MRI cervical spine 04/13/2010, CT abdomen pelvis with contrast 02/14/2016. Chest PA and lateral 12/17/2016 FINDINGS: CT HEAD FINDINGS Brain: There is mild global atrophy for age, mild atrophic ventriculomegaly and small-vessel disease. No infarct, hemorrhage or mass are seen. There is no midline shift. Basal cisterns are clear. Vascular: There are calcifications in the siphons, distal vertebral and basilar arteries. No hyperdense central vessels. Skull: No fracture or focal lesions are seen. There is left lateral high parietal scalp swelling. Other: None. CT MAXILLOFACIAL FINDINGS Osseous: The facial study is motion compromised. There is no obvious displaced fracture but a subtle fracture would be missed. No mandibular dislocation. Orbits: No traumatic or inflammatory finding is seen through the motion artifacts. There are  old lens replacements. Sinuses: No significant sinus disease is seen through the motion artifacts. The mastoid air cells are clear. The ostiomeatal complexes are patent. Nasal septum is midline. Soft tissues: There is a skin laceration in the lateral right periorbital area with mild swelling. The epiglottis and tongue base are normal. CT CERVICAL SPINE FINDINGS Alignment: There is a chronic trace degenerative anterolisthesis at C5-6, slight levoscoliosis, and again noted bone-on-bone anterior atlantodental joint space loss with osteophytes and sclerosis of the dens. There is no new or acute traumatic malalignment. Skull base and vertebrae: Old anterior plate fusion H8-4 with solid arthrodesis is again noted. There has been interval C5-6 anterior plate fusion with solid arthrodesis at this level as well with the facet joints remaining patent. There is osteopenia with no evidence of fractures or primary pathologic process. There are small endplate Schmorl's nodes at C2-3. Soft tissues and spinal canal: No prevertebral fluid or swelling. No visible canal hematoma. There is no laryngeal mass. There is heavy calcification in the proximal cervical ICAs. There is advanced fatty replacement of the parotid glands, moderate fatty replacement of the submandibular gland. Disc levels: Partial disc space loss noted C2-3 and C3-4. Normal disc height at C5-6 and C7-T1. Fused disc spaces C4-5 and C6-7. Metal artifact from the fusion hardware does limit evaluation of the spinal canal but there is no obvious herniated disc or cord  compromise. There is trace marginal endplate ridging at C5-6 but no significant disc osteophyte complex. Facet joint and uncinate spurring is seen at most levels, with foraminal stenosis which is bilaterally severe C3-4, moderate on the right and mild on the left at C4-5, moderate on the right at C5-6. Remaining foramina are clear. Other: None. CT CHEST FINDINGS Cardiovascular: The cardiac size is normal.  The coronary arteries are heavily calcified. There is no pericardial effusion. There is mild tortuosity and moderate to heavy calcification in the thoracic aorta, patchy calcific plaques in the great vessels. There is no aortic aneurysm. The pulmonary arteries and veins are normal in caliber. Mediastinum/Nodes: Thyroid gland is atrophic, small in size without mass. No axillary or intrathoracic adenopathy are seen. Small hiatal hernia. There are calcified precarinal lymph nodes to the right. Lungs/Pleura: Calcified granuloma right upper lobe. Reticulated scarring both lung apices symmetrically. The lungs are otherwise clear. There is no pleural effusion, thickening or pneumothorax. Mild chronic elevation right hemidiaphragm. Musculoskeletal: There is osteopenia with degenerative changes of the first spine, mild kyphodextroscoliosis. No spinal compression fractures are seen. The ribs, sternum and visualized shoulder girdles show no displaced fractures. CT ABDOMEN AND PELVIS FINDINGS Hepatobiliary: Chronic postcholecystectomy intrahepatic and extrahepatic bile duct dilatation. The common bile duct is stable in caliber 16.2 mm. No ductal filling defect is seen. No focal abnormality in the unenhanced liver Pancreas: No abnormality is seen without contrast. Spleen: No abnormality is seen without contrast. A 1.3 cm calcified splenic artery aneurysm is again noted along the lower splenic hilum. Adrenals/Urinary Tract: Chronic perinephric stranding appears similar. There is no adrenal mass. Unremarkable unenhanced renal cortex again noted. There are no urinary stones or obstructive uropathy. There is no bladder thickening. Stomach/Bowel: No dilatation or wall thickening. An appendix is not seen. There is diffuse colonic diverticulosis most advanced in the sigmoid segment but no evidence of focal colitis or diverticulitis. Vascular/Lymphatic: Heavy aortic and branch vessel atherosclerosis. No AAA. No MEF. Reproductive: Status  post hysterectomy. No adnexal masses. Other: Pelvic floor laxity is again seen but the prior demonstration of a cystocele is not noted today. There is no free air, free hemorrhage or free fluid. There are no incarcerated hernias. Scattered pelvic phleboliths. Musculoskeletal: There is osteopenia and degenerative change of the lumbar spine. Hemangiomatous replacement L2 vertebral body. There are chronic laminectomy defects at L3 and 4. No regional skeletal fracture is seen. L5 is transitional with bilateral fused sacral articulations. IMPRESSION: 1. No acute intracranial CT findings or depressed skull fractures. Left lateral high parietal scalp swelling. 2. Motion limited maxillofacial CT with no obvious displaced fractures. Facial laceration lateral right periorbital area. 3. Osteopenia and degenerative changes without evidence of cervical fractures. 4. Interval C5-6 anterior plate fusion with solid arthrodesis. Old C4-5 mature fusion. 5. No acute trauma related findings in the chest, abdomen or pelvis. 6. Carotid, aortic and coronary artery atherosclerosis. 7. Diverticulosis without diverticulitis. 8. Pelvic floor laxity without evidence of cystocele. 9. Chronic post cholecystectomy intrahepatic and extrahepatic biliary dilatation. Electronically Signed   By: Almira Bar M.D.   On: 12/09/2022 23:26   CT T-SPINE NO CHARGE  Result Date: 12/09/2022 CLINICAL DATA:  Trauma EXAM: CT Thoracic Spine without contrast TECHNIQUE: Multiplanar CT images of the thoracic spine were reconstructed from contemporary CT of the Chest. RADIATION DOSE REDUCTION: This exam was performed according to the departmental dose-optimization program which includes automated exposure control, adjustment of the mA and/or kV according to patient size and/or use of iterative reconstruction  technique. CONTRAST:  None or No additional COMPARISON:  None Available. FINDINGS: Alignment: Thoracic alignment within normal limits. Vertebrae: Vertebral  bodies demonstrate normal stature. No fracture. Paraspinal and other soft tissues: Negative. Disc levels: Partially visualized anterior fusion hardware in the lower cervical spine. Multilevel degenerative osteophytes of the thoracic spine. No abnormal disc space widening. IMPRESSION: Degenerative changes of the thoracic spine. No acute osseous abnormality. Electronically Signed   By: Jasmine Pang M.D.   On: 12/09/2022 23:03   DG Wrist Complete Right  Result Date: 12/09/2022 CLINICAL DATA:  Fall EXAM: RIGHT WRIST - COMPLETE 3+ VIEW COMPARISON:  None Available. FINDINGS: No fracture or malalignment. Mild degenerative change at the first [facility] joint. Soft tissue swelling greatest adjacent to the ulna. IMPRESSION: No acute osseous abnormality. Soft tissue swelling. Electronically Signed   By: Jasmine Pang M.D.   On: 12/09/2022 21:34   DG Tibia/Fibula Right  Result Date: 12/09/2022 CLINICAL DATA:  Fall EXAM: RIGHT TIBIA AND FIBULA - 2 VIEW COMPARISON:  12/17/2016 FINDINGS: No fracture or malalignment. Vascular calcifications. Soft tissue swelling. IMPRESSION: No acute osseous abnormality. Electronically Signed   By: Jasmine Pang M.D.   On: 12/09/2022 21:33   DG Tibia/Fibula Left  Result Date: 12/09/2022 CLINICAL DATA:  Fall EXAM: LEFT TIBIA AND FIBULA - 2 VIEW COMPARISON:  11/14/2010, 11/10/2010 FINDINGS: Surgical plate and fixating screws in the proximal tibia. No acute displaced fracture or malalignment. Generalized soft tissue edema. Old tibial plateau fractures IMPRESSION: Postsurgical changes of the proximal tibia with old tibial plateau fractures. No acute osseous abnormality. Electronically Signed   By: Jasmine Pang M.D.   On: 12/09/2022 21:32   DG Elbow 2 Views Right  Result Date: 12/09/2022 CLINICAL DATA:  Fall EXAM: RIGHT ELBOW - 2 VIEW COMPARISON:  None Available. FINDINGS: There is no evidence of fracture, dislocation, or joint effusion. There is no evidence of arthropathy or other focal bone  abnormality. Soft tissues are unremarkable. IMPRESSION: Negative. Electronically Signed   By: Darliss Cheney M.D.   On: 12/09/2022 21:30    Pending Labs Unresulted Labs (From admission, onward)     Start     Ordered   12/17/22 0500  Creatinine, serum  (enoxaparin (LOVENOX)    CrCl >/= 30 ml/min)  Weekly,   R     Comments: while on enoxaparin therapy    12/10/22 0137   12/10/22 0500  CBC  Tomorrow morning,   R        12/10/22 0138   12/10/22 0500  Basic metabolic panel  Tomorrow morning,   R        12/10/22 0138   12/10/22 0500  Magnesium  Tomorrow morning,   R        12/10/22 0138   12/10/22 0500  Phosphorus  Tomorrow morning,   R        12/10/22 0138            Vitals/Pain Today's Vitals   12/09/22 2006 12/09/22 2015 12/09/22 2203 12/09/22 2234  BP:  (!) 187/58  (!) 170/90  Pulse:  60  68  Resp:  18  12  Temp:    98 F (36.7 C)  TempSrc:    Oral  SpO2:  99%  100%  Weight: 75 kg     PainSc: 9   8  9      Isolation Precautions No active isolations  Medications Medications  morphine (PF) 4 MG/ML injection 4 mg (has no administration in time range)  acetaminophen (  TYLENOL) tablet 1,000 mg (has no administration in time range)  acetaminophen (TYLENOL) tablet 650 mg (has no administration in time range)    Or  acetaminophen (TYLENOL) suppository 650 mg (has no administration in time range)  traZODone (DESYREL) tablet 25 mg (has no administration in time range)  docusate sodium (COLACE) capsule 100 mg (has no administration in time range)  enoxaparin (LOVENOX) injection 30 mg (has no administration in time range)  cefTRIAXone (ROCEPHIN) 2 g in sodium chloride 0.9 % 100 mL IVPB (has no administration in time range)  Tdap (BOOSTRIX) injection 0.5 mL (0.5 mLs Intramuscular Given 12/09/22 2138)  morphine (PF) 4 MG/ML injection 4 mg (4 mg Intravenous Given 12/09/22 2131)  cefTRIAXone (ROCEPHIN) 1 g in sodium chloride 0.9 % 100 mL IVPB (0 g Intravenous Stopped 12/10/22 0001)   lidocaine-EPINEPHrine-tetracaine (LET) topical gel (3 mLs Topical Given 12/09/22 2324)  lidocaine-EPINEPHrine (XYLOCAINE W/EPI) 2 %-1:200000 (PF) injection 10 mL (10 mLs Intradermal Given 12/09/22 2323)  sodium chloride 0.9 % bolus 500 mL (0 mLs Intravenous Stopped 12/10/22 0131)    Mobility walks with device     Focused Assessments Skin- facial lac/ mulltiple skin tears to bil arms/    R Recommendations: See Admitting Provider Note  Report given to:   Additional Notes:

## 2022-12-10 NOTE — TOC Initial Note (Signed)
Transition of Care [facility]) - Initial/Assessment Note    Patient Details  Name: [name] MRN: [id number] Date of Birth: 1929-04-22  Transition of [facility]) CM/SW Contact:    Eduard Roux, LCSW Phone Number: 12/10/2022, 2:27 PM  Clinical Narrative:                  CSW met with patient's son,Eurnie. Patient was in the bed resting. CSW introduced self and explained role. He states patient lives home alone and needs rehab before returning home. He reports she has been to [facility] before. [facility] is the preferred SNF. CSW was given permission to send referrals to other SNFs as back up to [place]. All questions answered.   TOC will Provide bed offers once available   Antony Blackbird, MSW, LCSW Clinical Social Worker    Expected Discharge Plan: Skilled Nursing Facility Barriers to Discharge: Continued Medical Work up, SNF Pending bed offer   Patient Goals and CMS Choice            Expected Discharge Plan and Services In-house Referral: Clinical Social Work     Living arrangements for the past 2 months: Single Family Home                                      Prior Living Arrangements/Services Living arrangements for the past 2 months: Single Family Home Lives with:: Self Patient language and need for interpreter reviewed:: No        Need for Family Participation in Patient Care: Yes (Comment) Care giver support system in place?: Yes (comment)   Criminal Activity/Legal Involvement Pertinent to Current Situation/Hospitalization: No - Comment as needed  Activities of Daily Living      Permission Sought/Granted Permission sought to share information with : Family Supports, Oceanographer granted to share information with : Yes, Verbal Permission Granted (persmission given by her son)     Permission granted to share info w AGENCY: SNFs     Permission granted to share info w Contact Information:  [phone]  Emotional Assessment   Attitude/Demeanor/Rapport: Unable to Assess Affect (typically observed): Unable to Assess (paitent was resting- CSW talked w/her son) Orientation: : Oriented to Self, Oriented to Place Alcohol / Substance Use: Not Applicable Psych Involvement: No (comment)  Admission diagnosis:  Fall [W19.XXXA] Acute cystitis without hematuria [N30.00] Facial laceration, initial encounter [S01.81XA] Fall, initial encounter [W19.XXXA] Patient Active Problem List   Diagnosis Date Noted   Fall 12/10/2022   Right sided weakness 05/04/2022   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   CKD (chronic kidney disease) stage 3, GFR 30-59 ml/min (HCC) 03/31/2019   Anemia 03/07/2015   Transaminitis 11/20/2014   Hyponatremia 10/09/2014   Lightheaded 08/30/2014   Right hip pain 07/25/2014   Chronic neck pain 06/06/2014   Memory loss 06/23/2013   Right shoulder pain 06/23/2013   Eczema 01/16/2013   Leg edema 08/23/2012   Chronic insomnia 08/09/2012   Renal insufficiency 03/31/2011   CYSTOCELE WITHOUT MENTION UTERINE PROLAPSE LAT 08/29/2010   Anemia of chronic disease 05/06/2010   LIVER FUNCTION TESTS, ABNORMAL, HX OF 01/14/2009   Hypothyroidism 04/04/2007   Adjustment disorder with mixed anxiety and depressed mood 04/04/2007   Essential hypertension 04/04/2007   PCP:  Shirline Frees, NP Pharmacy:   [id number] - [place], [place] - [address] [place] [address] Phone: [phone] Fax: [phone]  [facility] - [place], [place] - [address] [place] [address] Phone: [phone] Fax: [phone]     Social Determinants of Health (SDOH) Social History: SDOH Screenings   Food Insecurity: No Food Insecurity (06/05/2022)  Housing: Low Risk  (09/25/2020)  Transportation Needs: No Transportation Needs (06/05/2022)  Alcohol Screen: Low Risk   (09/25/2020)  Depression (PHQ2-9): Low Risk  (06/05/2022)  Financial Resource Strain: Low Risk  (06/05/2022)  Physical Activity: Inactive (06/05/2022)  Social Connections: Moderately Isolated (09/25/2020)  Stress: Stress Concern Present (06/05/2022)  Tobacco Use: Medium Risk (12/09/2022)   SDOH Interventions:     Readmission Risk Interventions     No data to display

## 2022-12-11 DIAGNOSIS — W19XXXA Unspecified fall, initial encounter: Secondary | ICD-10-CM | POA: Diagnosis not present

## 2022-12-11 DIAGNOSIS — F02A0 Dementia in other diseases classified elsewhere, mild, without behavioral disturbance, psychotic disturbance, mood disturbance, and anxiety: Secondary | ICD-10-CM | POA: Diagnosis not present

## 2022-12-11 DIAGNOSIS — R262 Difficulty in walking, not elsewhere classified: Secondary | ICD-10-CM | POA: Diagnosis not present

## 2022-12-11 DIAGNOSIS — F02C3 Dementia in other diseases classified elsewhere, severe, with mood disturbance: Secondary | ICD-10-CM | POA: Diagnosis not present

## 2022-12-11 DIAGNOSIS — F32A Depression, unspecified: Secondary | ICD-10-CM | POA: Diagnosis not present

## 2022-12-11 DIAGNOSIS — Z9181 History of falling: Secondary | ICD-10-CM | POA: Diagnosis not present

## 2022-12-11 DIAGNOSIS — M6250 Muscle wasting and atrophy, not elsewhere classified, unspecified site: Secondary | ICD-10-CM | POA: Diagnosis not present

## 2022-12-11 DIAGNOSIS — F03B3 Unspecified dementia, moderate, with mood disturbance: Secondary | ICD-10-CM | POA: Diagnosis not present

## 2022-12-11 DIAGNOSIS — D631 Anemia in chronic kidney disease: Secondary | ICD-10-CM | POA: Diagnosis not present

## 2022-12-11 DIAGNOSIS — I129 Hypertensive chronic kidney disease with stage 1 through stage 4 chronic kidney disease, or unspecified chronic kidney disease: Secondary | ICD-10-CM | POA: Diagnosis not present

## 2022-12-11 DIAGNOSIS — R5381 Other malaise: Secondary | ICD-10-CM | POA: Diagnosis not present

## 2022-12-11 DIAGNOSIS — F39 Unspecified mood [affective] disorder: Secondary | ICD-10-CM | POA: Diagnosis not present

## 2022-12-11 DIAGNOSIS — F5101 Primary insomnia: Secondary | ICD-10-CM | POA: Diagnosis not present

## 2022-12-11 DIAGNOSIS — E039 Hypothyroidism, unspecified: Secondary | ICD-10-CM | POA: Diagnosis not present

## 2022-12-11 DIAGNOSIS — W19XXXD Unspecified fall, subsequent encounter: Secondary | ICD-10-CM | POA: Diagnosis not present

## 2022-12-11 DIAGNOSIS — Z8719 Personal history of other diseases of the digestive system: Secondary | ICD-10-CM | POA: Diagnosis not present

## 2022-12-11 DIAGNOSIS — Z79899 Other long term (current) drug therapy: Secondary | ICD-10-CM | POA: Diagnosis not present

## 2022-12-11 DIAGNOSIS — N183 Chronic kidney disease, stage 3 unspecified: Secondary | ICD-10-CM | POA: Diagnosis not present

## 2022-12-11 DIAGNOSIS — M6281 Muscle weakness (generalized): Secondary | ICD-10-CM | POA: Diagnosis not present

## 2022-12-11 DIAGNOSIS — N200 Calculus of kidney: Secondary | ICD-10-CM | POA: Diagnosis not present

## 2022-12-11 DIAGNOSIS — R41841 Cognitive communication deficit: Secondary | ICD-10-CM | POA: Diagnosis not present

## 2022-12-11 DIAGNOSIS — F0390 Unspecified dementia without behavioral disturbance: Secondary | ICD-10-CM | POA: Diagnosis not present

## 2022-12-11 DIAGNOSIS — Z8679 Personal history of other diseases of the circulatory system: Secondary | ICD-10-CM | POA: Diagnosis not present

## 2022-12-11 DIAGNOSIS — G3183 Dementia with Lewy bodies: Secondary | ICD-10-CM | POA: Diagnosis not present

## 2022-12-11 DIAGNOSIS — M4696 Unspecified inflammatory spondylopathy, lumbar region: Secondary | ICD-10-CM | POA: Diagnosis not present

## 2022-12-11 DIAGNOSIS — S0181XD Laceration without foreign body of other part of head, subsequent encounter: Secondary | ICD-10-CM | POA: Diagnosis not present

## 2022-12-11 DIAGNOSIS — R609 Edema, unspecified: Secondary | ICD-10-CM | POA: Diagnosis not present

## 2022-12-11 DIAGNOSIS — T148XXA Other injury of unspecified body region, initial encounter: Secondary | ICD-10-CM | POA: Diagnosis not present

## 2022-12-11 DIAGNOSIS — Z7189 Other specified counseling: Secondary | ICD-10-CM | POA: Diagnosis not present

## 2022-12-11 DIAGNOSIS — M6259 Muscle wasting and atrophy, not elsewhere classified, multiple sites: Secondary | ICD-10-CM | POA: Diagnosis not present

## 2022-12-11 DIAGNOSIS — I10 Essential (primary) hypertension: Secondary | ICD-10-CM | POA: Diagnosis not present

## 2022-12-11 DIAGNOSIS — Z741 Need for assistance with personal care: Secondary | ICD-10-CM | POA: Diagnosis not present

## 2022-12-11 DIAGNOSIS — E871 Hypo-osmolality and hyponatremia: Secondary | ICD-10-CM | POA: Diagnosis not present

## 2022-12-11 DIAGNOSIS — R2681 Unsteadiness on feet: Secondary | ICD-10-CM | POA: Diagnosis not present

## 2022-12-11 DIAGNOSIS — Z87448 Personal history of other diseases of urinary system: Secondary | ICD-10-CM | POA: Diagnosis not present

## 2022-12-11 DIAGNOSIS — F03C2 Unspecified dementia, severe, with psychotic disturbance: Secondary | ICD-10-CM | POA: Diagnosis not present

## 2022-12-11 DIAGNOSIS — L0390 Cellulitis, unspecified: Secondary | ICD-10-CM | POA: Diagnosis not present

## 2022-12-11 DIAGNOSIS — R1312 Dysphagia, oropharyngeal phase: Secondary | ICD-10-CM | POA: Diagnosis not present

## 2022-12-11 DIAGNOSIS — S0181XA Laceration without foreign body of other part of head, initial encounter: Secondary | ICD-10-CM | POA: Diagnosis not present

## 2022-12-11 DIAGNOSIS — Z7401 Bed confinement status: Secondary | ICD-10-CM | POA: Diagnosis not present

## 2022-12-11 DIAGNOSIS — R531 Weakness: Secondary | ICD-10-CM | POA: Diagnosis not present

## 2022-12-11 LAB — CBC
HCT: 27.8 % — ABNORMAL LOW (ref 36.0–46.0)
Hemoglobin: 9.1 g/dL — ABNORMAL LOW (ref 12.0–15.0)
MCH: 31.1 pg (ref 26.0–34.0)
MCHC: 32.7 g/dL (ref 30.0–36.0)
MCV: 94.9 fL (ref 80.0–100.0)
Platelets: 234 10*3/uL (ref 150–400)
RBC: 2.93 MIL/uL — ABNORMAL LOW (ref 3.87–5.11)
RDW: 12.9 % (ref 11.5–15.5)
WBC: 5.6 10*3/uL (ref 4.0–10.5)
nRBC: 0 % (ref 0.0–0.2)

## 2022-12-11 LAB — T4, FREE: Free T4: 0.82 ng/dL (ref 0.61–1.12)

## 2022-12-11 MED ORDER — SERTRALINE HCL 100 MG PO TABS: 100.0000 mg | ORAL_TABLET | Freq: Two times a day (BID) | ORAL | Status: AC

## 2022-12-11 MED ORDER — POLYETHYLENE GLYCOL 3350 17 G PO PACK: 17.0000 g | PACK | Freq: Every day | ORAL | Status: DC | PRN

## 2022-12-11 MED ORDER — ONDANSETRON HCL 4 MG PO TABS
4.0000 mg | ORAL_TABLET | Freq: Three times a day (TID) | ORAL | Status: DC | PRN
  Administered 2022-12-11: 4 mg via ORAL
  Filled 2022-12-11: qty 1

## 2022-12-11 MED ORDER — DOCUSATE SODIUM 100 MG PO CAPS: 100.0000 mg | ORAL_CAPSULE | Freq: Two times a day (BID) | ORAL | Status: DC

## 2022-12-11 MED ORDER — HYDRALAZINE HCL 25 MG PO TABS
25.0000 mg | ORAL_TABLET | Freq: Three times a day (TID) | ORAL | Status: DC
  Administered 2022-12-11 (×2): 25 mg via ORAL
  Filled 2022-12-11 (×2): qty 1

## 2022-12-11 MED ORDER — DONEPEZIL HCL 10 MG PO TABS: 10.0000 mg | ORAL_TABLET | Freq: Every day | ORAL | Status: DC

## 2022-12-11 MED ORDER — OLMESARTAN MEDOXOMIL 20 MG PO TABS
10.0000 mg | ORAL_TABLET | Freq: Every day | ORAL | Status: DC
Start: 2022-12-11 — End: 2024-01-13

## 2022-12-11 MED ORDER — LEVOTHYROXINE SODIUM 150 MCG PO TABS: 150.0000 ug | ORAL_TABLET | Freq: Every day | ORAL | Status: DC

## 2022-12-11 NOTE — Discharge Instructions (Signed)

## 2022-12-11 NOTE — TOC Progression Note (Deleted)
Transition of Care [facility]) - Progression Note    Patient Details  Name: [name] MRN: [id number] Date of Birth: [birth year]  Transition of [facility]) CM/SW Contact  Eduard Roux, [place] Phone Number: 12/11/2022, 2:19 PM  Clinical Narrative:       Expected Discharge Plan: Skilled Nursing Facility Barriers to Discharge: Continued Medical Work up, SNF Pending bed offer  Expected Discharge Plan and Services In-house Referral: Clinical Social Work     Living arrangements for the past 2 months: Single Family Home                                       Social Determinants of Health (SDOH) Interventions SDOH Screenings   Food Insecurity: No Food Insecurity (06/05/2022)  Housing: Low Risk  (09/25/2020)  Transportation Needs: No Transportation Needs (06/05/2022)  Alcohol Screen: Low Risk  (09/25/2020)  Depression (PHQ2-9): Low Risk  (06/05/2022)  Financial Resource Strain: Low Risk  (06/05/2022)  Physical Activity: Inactive (06/05/2022)  Social Connections: Moderately Isolated (09/25/2020)  Stress: Stress Concern Present (06/05/2022)  Tobacco Use: Medium Risk (12/09/2022)    Readmission Risk Interventions     No data to display

## 2022-12-11 NOTE — Discharge Summary (Signed)
Physician Discharge Summary  [name] ION:[id number] DOB: [birth year]  PCP: Shirline Frees, NP  Admitted from: Home Discharged to: SNF  Admit date: 12/09/2022 Discharge date: 12/11/2022  Recommendations for Outpatient Follow-up:    Contact information for follow-up providers     MD at SNF. Schedule an appointment as soon as possible for a visit.   Why: To be seen in 3 to 4 days with repeat labs (CBC & BMP).        Shirline Frees, NP. Schedule an appointment as soon as possible for a visit.   Specialty: Family Medicine Why: To be seen upon discharge from SNF. Contact information: [address] [place] [address] [phone]              Contact information for after-discharge care     Destination     [facility], [facility]erred [facility] .   Service: Skilled Nursing Contact information: [address] [place] [address] [phone]                      Home Health: None    Equipment/Devices: None    Discharge Condition: Improved and stable   Code Status: Full Code Diet recommendation:  Discharge Diet Orders (From admission, onward)     Start     Ordered   12/11/22 0000  Diet - low sodium heart healthy        12/11/22 1442             Discharge Diagnoses:  Principal Problem:   Fall   Brief Summary: 89 year old female, claims to live alone and uses a walker to ambulate, medical history significant for dementia, hypothyroidism, hypertension, hyperlipidemia, stage IIIb CKD, anemia of chronic disease who presented to the ED on 12/09/2022 from home after an unwitnessed fall.  Extensive imaging negative for fractures.  Asymptomatic pyuria.  Awaiting PT and OT evaluation, may likely need SNF for STR, TOC consulted for SNF, medically ready for DC to SNF pending SNF bed and insurance authorization.     Assessment & Plan:  Principal Problem:   Fall     Unwitnessed mechanical fall at  home: Patient reports that this is not her first fall.  Specifically denies LOC.  No fractures on extensive imaging.  Did have right facial laceration which has been sutured and multiple facial bruises, bilateral leg bruises.  Currently without pain.  Likely due to very advanced age, physical frailty/deconditioning.  Low index of suspicion for UTI.  No rhabdomyolysis.  PT, OT evaluated and recommend SNF.  TOC on board.  Fall precautions.  Multimodality pain control but minimize opioids.  Delirium precautions.  Has not used much pain medications except a dose of Tylenol early this morning.   Asymptomatic bacteriuria: Patient specifically denies dysuria, urinary frequency, urgency or suprapubic abdominal pain.  UA only shows pyuria without nitrites or bacteria.  Leukocytosis may be reactive.  Low index of suspicion for UTI and hence discontinued empirically started ceftriaxone.   Stage IIIb chronic kidney disease: Stable with creatinine at baseline.  Outpatient follow-up.   Hyponatremia, mild POA.  Not clinically relevant to current admission.   Acute on chronic anemia of chronic disease: Baseline hemoglobin may be in the 9-10 g range.  Hemoglobin dropped from 9.8-8.3, may be due to acute blood loss and hemodilution.  Hemoglobin stable.   Dementia without behavioral abnormalities: Suspect mild or may be even moderate dementia.  Delirium precautions.  Continue Aricept and Namenda.  Also on Seroquel and sertraline, continue.   Hypothyroidism: Continue levothyroxine.  Clinically euthyroid.  TSH 5.128.  I have added free T3 and free T4 to this morning's labs but if they are not done here, check free T3 and free T4 at SNF.   Hypertension Continue amlodipine and and prior home dose of Benicar.  She was getting lower than home dose of substituted ARB here.  Thereby discontinued briefly started oral hydralazine.  Monitor closely and adjust doses as needed.   Chronic anxiety/depression/chronic  insomnia Continue home regimen.   GERD Continue home PPI.   Body mass index is 30.12 kg/m./Obesity   Consultations: None  Procedures: None   Discharge Instructions  Discharge Instructions     Call MD for:  difficulty breathing, headache or visual disturbances   Complete by: As directed    Call MD for:  extreme fatigue   Complete by: As directed    Call MD for:  persistant dizziness or light-headedness   Complete by: As directed    Call MD for:  persistant nausea and vomiting   Complete by: As directed    Call MD for:  redness, tenderness, or signs of infection (pain, swelling, redness, odor or green/yellow discharge around incision site)   Complete by: As directed    Call MD for:  severe uncontrolled pain   Complete by: As directed    Call MD for:  temperature >100.4   Complete by: As directed    Diet - low sodium heart healthy   Complete by: As directed    Discharge instructions   Complete by: As directed    If right temporal area laceration sutures are not absorbable, MD at SNF to have them removed in 14 days.   Increase activity slowly   Complete by: As directed    No wound care   Complete by: As directed         Medication List     TAKE these medications    amLODipine 10 MG tablet Commonly known as: NORVASC TAKE 1 TABLET BY MOUTH DAILY   diphenhydramine-acetaminophen 25-500 MG Tabs tablet Commonly known as: TYLENOL PM Take 1 tablet by mouth at bedtime as needed (for pain/sleep).   docusate sodium 100 MG capsule Commonly known as: COLACE Take 1 capsule (100 mg total) by mouth 2 (two) times daily.   donepezil 10 MG tablet Commonly known as: ARICEPT Take 1 tablet (10 mg total) by mouth at bedtime. What changed:  how much to take how to take this when to take this additional instructions   levothyroxine 150 MCG tablet Commonly known as: SYNTHROID Take 1 tablet (150 mcg total) by mouth daily before breakfast.   memantine 10 MG  tablet Commonly known as: NAMENDA TAKE 1 TABLET BY MOUTH IN THE  MORNING AND AT BEDTIME What changed: See the new instructions.   olmesartan 20 MG tablet Commonly known as: BENICAR Take 0.5 tablets (10 mg total) by mouth daily.   omeprazole 40 MG capsule Commonly known as: PRILOSEC TAKE 1 CAPSULE BY MOUTH DAILY What changed:  how much to take how to take this when to take this   polyethylene glycol 17 g packet Commonly known as: MIRALAX / GLYCOLAX Take 17 g by mouth daily as needed for mild constipation.   PRESERVISION AREDS 2 PO Take 1 capsule by mouth daily with breakfast.   One-A-Day Womens 50+ Tabs Take 1 tablet by mouth daily with breakfast.   QUEtiapine 50 MG tablet Commonly known as: SEROquel  Take 1 tablet twice a day What changed:  how much to take how to take this when to take this additional instructions   Rexulti 0.5 MG Tabs Generic drug: Brexpiprazole Take 1 tablet every night What changed:  how much to take how to take this when to take this additional instructions   sertraline 100 MG tablet Commonly known as: Zoloft Take 1 tablet (100 mg total) by mouth in the morning and at bedtime.   Systane Ultra PF 0.4-0.3 % Soln Generic drug: Polyethyl Glyc-Propyl Glyc PF Place 1 drop into both eyes 3 (three) times daily as needed (for dryness).   TYLENOL 500 MG tablet Generic drug: acetaminophen Take 500 mg by mouth every 6 (six) hours as needed for mild pain or headache.       Allergies  Allergen Reactions   Oxycodone Hcl Itching      Procedures/Studies: CT HEAD WO CONTRAST  Addendum Date: 12/10/2022   ADDENDUM REPORT: 12/10/2022 01:54 ADDENDUM: The interval cervical fusion, new from prior studies was actually at C6-7, not at C5-6. Electronically Signed   By: Almira Bar M.D.   On: 12/10/2022 01:54   Result Date: 12/10/2022 CLINICAL DATA:  89 year old with blunt polytrauma, unspecified trauma mechanism. EXAM: CT HEAD WITHOUT CONTRAST CT  MAXILLOFACIAL WITHOUT CONTRAST CT CERVICAL SPINE WITHOUT CONTRAST CT CHEST, ABDOMEN AND PELVIS WITHOUT CONTRAST TECHNIQUE: Contiguous axial images were obtained from the base of the skull through the vertex without intravenous contrast. Multidetector CT imaging of the maxillofacial structures was performed. Multiplanar CT image reconstructions were also generated. A small metallic BB was placed on the right temple in order to reliably differentiate right from left. Multidetector CT imaging of the cervical spine was performed without intravenous contrast. Multiplanar CT image reconstructions were also generated. Multidetector CT imaging of the chest, abdomen and pelvis was performed without administration of intravenous contrast. RADIATION DOSE REDUCTION: This exam was performed according to the departmental dose-optimization program which includes automated exposure control, adjustment of the mA and/or kV according to patient size and/or use of iterative reconstruction technique. COMPARISON:  CT scan head 05/05/2022, MRI cervical spine 04/13/2010, CT abdomen pelvis with contrast 02/14/2016. Chest PA and lateral 12/17/2016 FINDINGS: CT HEAD FINDINGS Brain: There is mild global atrophy for age, mild atrophic ventriculomegaly and small-vessel disease. No infarct, hemorrhage or mass are seen. There is no midline shift. Basal cisterns are clear. Vascular: There are calcifications in the siphons, distal vertebral and basilar arteries. No hyperdense central vessels. Skull: No fracture or focal lesions are seen. There is left lateral high parietal scalp swelling. Other: None. CT MAXILLOFACIAL FINDINGS Osseous: The facial study is motion compromised. There is no obvious displaced fracture but a subtle fracture would be missed. No mandibular dislocation. Orbits: No traumatic or inflammatory finding is seen through the motion artifacts. There are old lens replacements. Sinuses: No significant sinus disease is seen through the  motion artifacts. The mastoid air cells are clear. The ostiomeatal complexes are patent. Nasal septum is midline. Soft tissues: There is a skin laceration in the lateral right periorbital area with mild swelling. The epiglottis and tongue base are normal. CT CERVICAL SPINE FINDINGS Alignment: There is a chronic trace degenerative anterolisthesis at C5-6, slight levoscoliosis, and again noted bone-on-bone anterior atlantodental joint space loss with osteophytes and sclerosis of the dens. There is no new or acute traumatic malalignment. Skull base and vertebrae: Old anterior plate fusion Z6-1 with solid arthrodesis is again noted. There has been interval C5-6 anterior plate fusion with  solid arthrodesis at this level as well with the facet joints remaining patent. There is osteopenia with no evidence of fractures or primary pathologic process. There are small endplate Schmorl's nodes at C2-3. Soft tissues and spinal canal: No prevertebral fluid or swelling. No visible canal hematoma. There is no laryngeal mass. There is heavy calcification in the proximal cervical ICAs. There is advanced fatty replacement of the parotid glands, moderate fatty replacement of the submandibular gland. Disc levels: Partial disc space loss noted C2-3 and C3-4. Normal disc height at C5-6 and C7-T1. Fused disc spaces C4-5 and C6-7. Metal artifact from the fusion hardware does limit evaluation of the spinal canal but there is no obvious herniated disc or cord compromise. There is trace marginal endplate ridging at C5-6 but no significant disc osteophyte complex. Facet joint and uncinate spurring is seen at most levels, with foraminal stenosis which is bilaterally severe C3-4, moderate on the right and mild on the left at C4-5, moderate on the right at C5-6. Remaining foramina are clear. Other: None. CT CHEST FINDINGS Cardiovascular: The cardiac size is normal. The coronary arteries are heavily calcified. There is no pericardial effusion.  There is mild tortuosity and moderate to heavy calcification in the thoracic aorta, patchy calcific plaques in the great vessels. There is no aortic aneurysm. The pulmonary arteries and veins are normal in caliber. Mediastinum/Nodes: Thyroid gland is atrophic, small in size without mass. No axillary or intrathoracic adenopathy are seen. Small hiatal hernia. There are calcified precarinal lymph nodes to the right. Lungs/Pleura: Calcified granuloma right upper lobe. Reticulated scarring both lung apices symmetrically. The lungs are otherwise clear. There is no pleural effusion, thickening or pneumothorax. Mild chronic elevation right hemidiaphragm. Musculoskeletal: There is osteopenia with degenerative changes of the first spine, mild kyphodextroscoliosis. No spinal compression fractures are seen. The ribs, sternum and visualized shoulder girdles show no displaced fractures. CT ABDOMEN AND PELVIS FINDINGS Hepatobiliary: Chronic postcholecystectomy intrahepatic and extrahepatic bile duct dilatation. The common bile duct is stable in caliber 16.2 mm. No ductal filling defect is seen. No focal abnormality in the unenhanced liver Pancreas: No abnormality is seen without contrast. Spleen: No abnormality is seen without contrast. A 1.3 cm calcified splenic artery aneurysm is again noted along the lower splenic hilum. Adrenals/Urinary Tract: Chronic perinephric stranding appears similar. There is no adrenal mass. Unremarkable unenhanced renal cortex again noted. There are no urinary stones or obstructive uropathy. There is no bladder thickening. Stomach/Bowel: No dilatation or wall thickening. An appendix is not seen. There is diffuse colonic diverticulosis most advanced in the sigmoid segment but no evidence of focal colitis or diverticulitis. Vascular/Lymphatic: Heavy aortic and branch vessel atherosclerosis. No AAA. No MEF. Reproductive: Status post hysterectomy. No adnexal masses. Other: Pelvic floor laxity is again seen  but the prior demonstration of a cystocele is not noted today. There is no free air, free hemorrhage or free fluid. There are no incarcerated hernias. Scattered pelvic phleboliths. Musculoskeletal: There is osteopenia and degenerative change of the lumbar spine. Hemangiomatous replacement L2 vertebral body. There are chronic laminectomy defects at L3 and 4. No regional skeletal fracture is seen. L5 is transitional with bilateral fused sacral articulations. IMPRESSION: 1. No acute intracranial CT findings or depressed skull fractures. Left lateral high parietal scalp swelling. 2. Motion limited maxillofacial CT with no obvious displaced fractures. Facial laceration lateral right periorbital area. 3. Osteopenia and degenerative changes without evidence of cervical fractures. 4. Interval C5-6 anterior plate fusion with solid arthrodesis. Old C4-5 mature fusion. 5.  No acute trauma related findings in the chest, abdomen or pelvis. 6. Carotid, aortic and coronary artery atherosclerosis. 7. Diverticulosis without diverticulitis. 8. Pelvic floor laxity without evidence of cystocele. 9. Chronic post cholecystectomy intrahepatic and extrahepatic biliary dilatation. Electronically Signed: By: Almira Bar M.D. On: 12/09/2022 23:26   CT MAXILLOFACIAL WO CONTRAST  Addendum Date: 12/10/2022   ADDENDUM REPORT: 12/10/2022 01:54 ADDENDUM: The interval cervical fusion, new from prior studies was actually at C6-7, not at C5-6. Electronically Signed   By: Almira Bar M.D.   On: 12/10/2022 01:54   Result Date: 12/10/2022 CLINICAL DATA:  89 year old with blunt polytrauma, unspecified trauma mechanism. EXAM: CT HEAD WITHOUT CONTRAST CT MAXILLOFACIAL WITHOUT CONTRAST CT CERVICAL SPINE WITHOUT CONTRAST CT CHEST, ABDOMEN AND PELVIS WITHOUT CONTRAST TECHNIQUE: Contiguous axial images were obtained from the base of the skull through the vertex without intravenous contrast. Multidetector CT imaging of the maxillofacial structures was  performed. Multiplanar CT image reconstructions were also generated. A small metallic BB was placed on the right temple in order to reliably differentiate right from left. Multidetector CT imaging of the cervical spine was performed without intravenous contrast. Multiplanar CT image reconstructions were also generated. Multidetector CT imaging of the chest, abdomen and pelvis was performed without administration of intravenous contrast. RADIATION DOSE REDUCTION: This exam was performed according to the departmental dose-optimization program which includes automated exposure control, adjustment of the mA and/or kV according to patient size and/or use of iterative reconstruction technique. COMPARISON:  CT scan head 05/05/2022, MRI cervical spine 04/13/2010, CT abdomen pelvis with contrast 02/14/2016. Chest PA and lateral 12/17/2016 FINDINGS: CT HEAD FINDINGS Brain: There is mild global atrophy for age, mild atrophic ventriculomegaly and small-vessel disease. No infarct, hemorrhage or mass are seen. There is no midline shift. Basal cisterns are clear. Vascular: There are calcifications in the siphons, distal vertebral and basilar arteries. No hyperdense central vessels. Skull: No fracture or focal lesions are seen. There is left lateral high parietal scalp swelling. Other: None. CT MAXILLOFACIAL FINDINGS Osseous: The facial study is motion compromised. There is no obvious displaced fracture but a subtle fracture would be missed. No mandibular dislocation. Orbits: No traumatic or inflammatory finding is seen through the motion artifacts. There are old lens replacements. Sinuses: No significant sinus disease is seen through the motion artifacts. The mastoid air cells are clear. The ostiomeatal complexes are patent. Nasal septum is midline. Soft tissues: There is a skin laceration in the lateral right periorbital area with mild swelling. The epiglottis and tongue base are normal. CT CERVICAL SPINE FINDINGS Alignment:  There is a chronic trace degenerative anterolisthesis at C5-6, slight levoscoliosis, and again noted bone-on-bone anterior atlantodental joint space loss with osteophytes and sclerosis of the dens. There is no new or acute traumatic malalignment. Skull base and vertebrae: Old anterior plate fusion Z6-1 with solid arthrodesis is again noted. There has been interval C5-6 anterior plate fusion with solid arthrodesis at this level as well with the facet joints remaining patent. There is osteopenia with no evidence of fractures or primary pathologic process. There are small endplate Schmorl's nodes at C2-3. Soft tissues and spinal canal: No prevertebral fluid or swelling. No visible canal hematoma. There is no laryngeal mass. There is heavy calcification in the proximal cervical ICAs. There is advanced fatty replacement of the parotid glands, moderate fatty replacement of the submandibular gland. Disc levels: Partial disc space loss noted C2-3 and C3-4. Normal disc height at C5-6 and C7-T1. Fused disc spaces C4-5 and C6-7. Metal  artifact from the fusion hardware does limit evaluation of the spinal canal but there is no obvious herniated disc or cord compromise. There is trace marginal endplate ridging at C5-6 but no significant disc osteophyte complex. Facet joint and uncinate spurring is seen at most levels, with foraminal stenosis which is bilaterally severe C3-4, moderate on the right and mild on the left at C4-5, moderate on the right at C5-6. Remaining foramina are clear. Other: None. CT CHEST FINDINGS Cardiovascular: The cardiac size is normal. The coronary arteries are heavily calcified. There is no pericardial effusion. There is mild tortuosity and moderate to heavy calcification in the thoracic aorta, patchy calcific plaques in the great vessels. There is no aortic aneurysm. The pulmonary arteries and veins are normal in caliber. Mediastinum/Nodes: Thyroid gland is atrophic, small in size without mass. No  axillary or intrathoracic adenopathy are seen. Small hiatal hernia. There are calcified precarinal lymph nodes to the right. Lungs/Pleura: Calcified granuloma right upper lobe. Reticulated scarring both lung apices symmetrically. The lungs are otherwise clear. There is no pleural effusion, thickening or pneumothorax. Mild chronic elevation right hemidiaphragm. Musculoskeletal: There is osteopenia with degenerative changes of the first spine, mild kyphodextroscoliosis. No spinal compression fractures are seen. The ribs, sternum and visualized shoulder girdles show no displaced fractures. CT ABDOMEN AND PELVIS FINDINGS Hepatobiliary: Chronic postcholecystectomy intrahepatic and extrahepatic bile duct dilatation. The common bile duct is stable in caliber 16.2 mm. No ductal filling defect is seen. No focal abnormality in the unenhanced liver Pancreas: No abnormality is seen without contrast. Spleen: No abnormality is seen without contrast. A 1.3 cm calcified splenic artery aneurysm is again noted along the lower splenic hilum. Adrenals/Urinary Tract: Chronic perinephric stranding appears similar. There is no adrenal mass. Unremarkable unenhanced renal cortex again noted. There are no urinary stones or obstructive uropathy. There is no bladder thickening. Stomach/Bowel: No dilatation or wall thickening. An appendix is not seen. There is diffuse colonic diverticulosis most advanced in the sigmoid segment but no evidence of focal colitis or diverticulitis. Vascular/Lymphatic: Heavy aortic and branch vessel atherosclerosis. No AAA. No MEF. Reproductive: Status post hysterectomy. No adnexal masses. Other: Pelvic floor laxity is again seen but the prior demonstration of a cystocele is not noted today. There is no free air, free hemorrhage or free fluid. There are no incarcerated hernias. Scattered pelvic phleboliths. Musculoskeletal: There is osteopenia and degenerative change of the lumbar spine. Hemangiomatous replacement  L2 vertebral body. There are chronic laminectomy defects at L3 and 4. No regional skeletal fracture is seen. L5 is transitional with bilateral fused sacral articulations. IMPRESSION: 1. No acute intracranial CT findings or depressed skull fractures. Left lateral high parietal scalp swelling. 2. Motion limited maxillofacial CT with no obvious displaced fractures. Facial laceration lateral right periorbital area. 3. Osteopenia and degenerative changes without evidence of cervical fractures. 4. Interval C5-6 anterior plate fusion with solid arthrodesis. Old C4-5 mature fusion. 5. No acute trauma related findings in the chest, abdomen or pelvis. 6. Carotid, aortic and coronary artery atherosclerosis. 7. Diverticulosis without diverticulitis. 8. Pelvic floor laxity without evidence of cystocele. 9. Chronic post cholecystectomy intrahepatic and extrahepatic biliary dilatation. Electronically Signed: By: Almira Bar M.D. On: 12/09/2022 23:26   CT CERVICAL SPINE WO CONTRAST  Addendum Date: 12/10/2022   ADDENDUM REPORT: 12/10/2022 01:54 ADDENDUM: The interval cervical fusion, new from prior studies was actually at C6-7, not at C5-6. Electronically Signed   By: Almira Bar M.D.   On: 12/10/2022 01:54   Result Date: 12/10/2022  CLINICAL DATA:  89 year old with blunt polytrauma, unspecified trauma mechanism. EXAM: CT HEAD WITHOUT CONTRAST CT MAXILLOFACIAL WITHOUT CONTRAST CT CERVICAL SPINE WITHOUT CONTRAST CT CHEST, ABDOMEN AND PELVIS WITHOUT CONTRAST TECHNIQUE: Contiguous axial images were obtained from the base of the skull through the vertex without intravenous contrast. Multidetector CT imaging of the maxillofacial structures was performed. Multiplanar CT image reconstructions were also generated. A small metallic BB was placed on the right temple in order to reliably differentiate right from left. Multidetector CT imaging of the cervical spine was performed without intravenous contrast. Multiplanar CT image  reconstructions were also generated. Multidetector CT imaging of the chest, abdomen and pelvis was performed without administration of intravenous contrast. RADIATION DOSE REDUCTION: This exam was performed according to the departmental dose-optimization program which includes automated exposure control, adjustment of the mA and/or kV according to patient size and/or use of iterative reconstruction technique. COMPARISON:  CT scan head 05/05/2022, MRI cervical spine 04/13/2010, CT abdomen pelvis with contrast 02/14/2016. Chest PA and lateral 12/17/2016 FINDINGS: CT HEAD FINDINGS Brain: There is mild global atrophy for age, mild atrophic ventriculomegaly and small-vessel disease. No infarct, hemorrhage or mass are seen. There is no midline shift. Basal cisterns are clear. Vascular: There are calcifications in the siphons, distal vertebral and basilar arteries. No hyperdense central vessels. Skull: No fracture or focal lesions are seen. There is left lateral high parietal scalp swelling. Other: None. CT MAXILLOFACIAL FINDINGS Osseous: The facial study is motion compromised. There is no obvious displaced fracture but a subtle fracture would be missed. No mandibular dislocation. Orbits: No traumatic or inflammatory finding is seen through the motion artifacts. There are old lens replacements. Sinuses: No significant sinus disease is seen through the motion artifacts. The mastoid air cells are clear. The ostiomeatal complexes are patent. Nasal septum is midline. Soft tissues: There is a skin laceration in the lateral right periorbital area with mild swelling. The epiglottis and tongue base are normal. CT CERVICAL SPINE FINDINGS Alignment: There is a chronic trace degenerative anterolisthesis at C5-6, slight levoscoliosis, and again noted bone-on-bone anterior atlantodental joint space loss with osteophytes and sclerosis of the dens. There is no new or acute traumatic malalignment. Skull base and vertebrae: Old anterior  plate fusion Z6-1 with solid arthrodesis is again noted. There has been interval C5-6 anterior plate fusion with solid arthrodesis at this level as well with the facet joints remaining patent. There is osteopenia with no evidence of fractures or primary pathologic process. There are small endplate Schmorl's nodes at C2-3. Soft tissues and spinal canal: No prevertebral fluid or swelling. No visible canal hematoma. There is no laryngeal mass. There is heavy calcification in the proximal cervical ICAs. There is advanced fatty replacement of the parotid glands, moderate fatty replacement of the submandibular gland. Disc levels: Partial disc space loss noted C2-3 and C3-4. Normal disc height at C5-6 and C7-T1. Fused disc spaces C4-5 and C6-7. Metal artifact from the fusion hardware does limit evaluation of the spinal canal but there is no obvious herniated disc or cord compromise. There is trace marginal endplate ridging at C5-6 but no significant disc osteophyte complex. Facet joint and uncinate spurring is seen at most levels, with foraminal stenosis which is bilaterally severe C3-4, moderate on the right and mild on the left at C4-5, moderate on the right at C5-6. Remaining foramina are clear. Other: None. CT CHEST FINDINGS Cardiovascular: The cardiac size is normal. The coronary arteries are heavily calcified. There is no pericardial effusion. There is mild  tortuosity and moderate to heavy calcification in the thoracic aorta, patchy calcific plaques in the great vessels. There is no aortic aneurysm. The pulmonary arteries and veins are normal in caliber. Mediastinum/Nodes: Thyroid gland is atrophic, small in size without mass. No axillary or intrathoracic adenopathy are seen. Small hiatal hernia. There are calcified precarinal lymph nodes to the right. Lungs/Pleura: Calcified granuloma right upper lobe. Reticulated scarring both lung apices symmetrically. The lungs are otherwise clear. There is no pleural effusion,  thickening or pneumothorax. Mild chronic elevation right hemidiaphragm. Musculoskeletal: There is osteopenia with degenerative changes of the first spine, mild kyphodextroscoliosis. No spinal compression fractures are seen. The ribs, sternum and visualized shoulder girdles show no displaced fractures. CT ABDOMEN AND PELVIS FINDINGS Hepatobiliary: Chronic postcholecystectomy intrahepatic and extrahepatic bile duct dilatation. The common bile duct is stable in caliber 16.2 mm. No ductal filling defect is seen. No focal abnormality in the unenhanced liver Pancreas: No abnormality is seen without contrast. Spleen: No abnormality is seen without contrast. A 1.3 cm calcified splenic artery aneurysm is again noted along the lower splenic hilum. Adrenals/Urinary Tract: Chronic perinephric stranding appears similar. There is no adrenal mass. Unremarkable unenhanced renal cortex again noted. There are no urinary stones or obstructive uropathy. There is no bladder thickening. Stomach/Bowel: No dilatation or wall thickening. An appendix is not seen. There is diffuse colonic diverticulosis most advanced in the sigmoid segment but no evidence of focal colitis or diverticulitis. Vascular/Lymphatic: Heavy aortic and branch vessel atherosclerosis. No AAA. No MEF. Reproductive: Status post hysterectomy. No adnexal masses. Other: Pelvic floor laxity is again seen but the prior demonstration of a cystocele is not noted today. There is no free air, free hemorrhage or free fluid. There are no incarcerated hernias. Scattered pelvic phleboliths. Musculoskeletal: There is osteopenia and degenerative change of the lumbar spine. Hemangiomatous replacement L2 vertebral body. There are chronic laminectomy defects at L3 and 4. No regional skeletal fracture is seen. L5 is transitional with bilateral fused sacral articulations. IMPRESSION: 1. No acute intracranial CT findings or depressed skull fractures. Left lateral high parietal scalp swelling.  2. Motion limited maxillofacial CT with no obvious displaced fractures. Facial laceration lateral right periorbital area. 3. Osteopenia and degenerative changes without evidence of cervical fractures. 4. Interval C5-6 anterior plate fusion with solid arthrodesis. Old C4-5 mature fusion. 5. No acute trauma related findings in the chest, abdomen or pelvis. 6. Carotid, aortic and coronary artery atherosclerosis. 7. Diverticulosis without diverticulitis. 8. Pelvic floor laxity without evidence of cystocele. 9. Chronic post cholecystectomy intrahepatic and extrahepatic biliary dilatation. Electronically Signed: By: Almira Bar M.D. On: 12/09/2022 23:26   CT CHEST ABDOMEN PELVIS WO CONTRAST  Addendum Date: 12/10/2022   ADDENDUM REPORT: 12/10/2022 01:54 ADDENDUM: The interval cervical fusion, new from prior studies was actually at C6-7, not at C5-6. Electronically Signed   By: Almira Bar M.D.   On: 12/10/2022 01:54   Result Date: 12/10/2022 CLINICAL DATA:  89 year old with blunt polytrauma, unspecified trauma mechanism. EXAM: CT HEAD WITHOUT CONTRAST CT MAXILLOFACIAL WITHOUT CONTRAST CT CERVICAL SPINE WITHOUT CONTRAST CT CHEST, ABDOMEN AND PELVIS WITHOUT CONTRAST TECHNIQUE: Contiguous axial images were obtained from the base of the skull through the vertex without intravenous contrast. Multidetector CT imaging of the maxillofacial structures was performed. Multiplanar CT image reconstructions were also generated. A small metallic BB was placed on the right temple in order to reliably differentiate right from left. Multidetector CT imaging of the cervical spine was performed without intravenous contrast. Multiplanar CT image reconstructions  were also generated. Multidetector CT imaging of the chest, abdomen and pelvis was performed without administration of intravenous contrast. RADIATION DOSE REDUCTION: This exam was performed according to the departmental dose-optimization program which includes automated  exposure control, adjustment of the mA and/or kV according to patient size and/or use of iterative reconstruction technique. COMPARISON:  CT scan head 05/05/2022, MRI cervical spine 04/13/2010, CT abdomen pelvis with contrast 02/14/2016. Chest PA and lateral 12/17/2016 FINDINGS: CT HEAD FINDINGS Brain: There is mild global atrophy for age, mild atrophic ventriculomegaly and small-vessel disease. No infarct, hemorrhage or mass are seen. There is no midline shift. Basal cisterns are clear. Vascular: There are calcifications in the siphons, distal vertebral and basilar arteries. No hyperdense central vessels. Skull: No fracture or focal lesions are seen. There is left lateral high parietal scalp swelling. Other: None. CT MAXILLOFACIAL FINDINGS Osseous: The facial study is motion compromised. There is no obvious displaced fracture but a subtle fracture would be missed. No mandibular dislocation. Orbits: No traumatic or inflammatory finding is seen through the motion artifacts. There are old lens replacements. Sinuses: No significant sinus disease is seen through the motion artifacts. The mastoid air cells are clear. The ostiomeatal complexes are patent. Nasal septum is midline. Soft tissues: There is a skin laceration in the lateral right periorbital area with mild swelling. The epiglottis and tongue base are normal. CT CERVICAL SPINE FINDINGS Alignment: There is a chronic trace degenerative anterolisthesis at C5-6, slight levoscoliosis, and again noted bone-on-bone anterior atlantodental joint space loss with osteophytes and sclerosis of the dens. There is no new or acute traumatic malalignment. Skull base and vertebrae: Old anterior plate fusion Z6-1 with solid arthrodesis is again noted. There has been interval C5-6 anterior plate fusion with solid arthrodesis at this level as well with the facet joints remaining patent. There is osteopenia with no evidence of fractures or primary pathologic process. There are small  endplate Schmorl's nodes at C2-3. Soft tissues and spinal canal: No prevertebral fluid or swelling. No visible canal hematoma. There is no laryngeal mass. There is heavy calcification in the proximal cervical ICAs. There is advanced fatty replacement of the parotid glands, moderate fatty replacement of the submandibular gland. Disc levels: Partial disc space loss noted C2-3 and C3-4. Normal disc height at C5-6 and C7-T1. Fused disc spaces C4-5 and C6-7. Metal artifact from the fusion hardware does limit evaluation of the spinal canal but there is no obvious herniated disc or cord compromise. There is trace marginal endplate ridging at C5-6 but no significant disc osteophyte complex. Facet joint and uncinate spurring is seen at most levels, with foraminal stenosis which is bilaterally severe C3-4, moderate on the right and mild on the left at C4-5, moderate on the right at C5-6. Remaining foramina are clear. Other: None. CT CHEST FINDINGS Cardiovascular: The cardiac size is normal. The coronary arteries are heavily calcified. There is no pericardial effusion. There is mild tortuosity and moderate to heavy calcification in the thoracic aorta, patchy calcific plaques in the great vessels. There is no aortic aneurysm. The pulmonary arteries and veins are normal in caliber. Mediastinum/Nodes: Thyroid gland is atrophic, small in size without mass. No axillary or intrathoracic adenopathy are seen. Small hiatal hernia. There are calcified precarinal lymph nodes to the right. Lungs/Pleura: Calcified granuloma right upper lobe. Reticulated scarring both lung apices symmetrically. The lungs are otherwise clear. There is no pleural effusion, thickening or pneumothorax. Mild chronic elevation right hemidiaphragm. Musculoskeletal: There is osteopenia with degenerative changes of the  first spine, mild kyphodextroscoliosis. No spinal compression fractures are seen. The ribs, sternum and visualized shoulder girdles show no displaced  fractures. CT ABDOMEN AND PELVIS FINDINGS Hepatobiliary: Chronic postcholecystectomy intrahepatic and extrahepatic bile duct dilatation. The common bile duct is stable in caliber 16.2 mm. No ductal filling defect is seen. No focal abnormality in the unenhanced liver Pancreas: No abnormality is seen without contrast. Spleen: No abnormality is seen without contrast. A 1.3 cm calcified splenic artery aneurysm is again noted along the lower splenic hilum. Adrenals/Urinary Tract: Chronic perinephric stranding appears similar. There is no adrenal mass. Unremarkable unenhanced renal cortex again noted. There are no urinary stones or obstructive uropathy. There is no bladder thickening. Stomach/Bowel: No dilatation or wall thickening. An appendix is not seen. There is diffuse colonic diverticulosis most advanced in the sigmoid segment but no evidence of focal colitis or diverticulitis. Vascular/Lymphatic: Heavy aortic and branch vessel atherosclerosis. No AAA. No MEF. Reproductive: Status post hysterectomy. No adnexal masses. Other: Pelvic floor laxity is again seen but the prior demonstration of a cystocele is not noted today. There is no free air, free hemorrhage or free fluid. There are no incarcerated hernias. Scattered pelvic phleboliths. Musculoskeletal: There is osteopenia and degenerative change of the lumbar spine. Hemangiomatous replacement L2 vertebral body. There are chronic laminectomy defects at L3 and 4. No regional skeletal fracture is seen. L5 is transitional with bilateral fused sacral articulations. IMPRESSION: 1. No acute intracranial CT findings or depressed skull fractures. Left lateral high parietal scalp swelling. 2. Motion limited maxillofacial CT with no obvious displaced fractures. Facial laceration lateral right periorbital area. 3. Osteopenia and degenerative changes without evidence of cervical fractures. 4. Interval C5-6 anterior plate fusion with solid arthrodesis. Old C4-5 mature fusion. 5.  No acute trauma related findings in the chest, abdomen or pelvis. 6. Carotid, aortic and coronary artery atherosclerosis. 7. Diverticulosis without diverticulitis. 8. Pelvic floor laxity without evidence of cystocele. 9. Chronic post cholecystectomy intrahepatic and extrahepatic biliary dilatation. Electronically Signed: By: Almira Bar M.D. On: 12/09/2022 23:26   DG Forearm Left  Result Date: 12/10/2022 CLINICAL DATA:  Fall forearm pain EXAM: LEFT FOREARM - 2 VIEW COMPARISON:  None Available. FINDINGS: No fracture or malalignment. Degenerative changes at the first [facility] joint. IMPRESSION: No acute osseous abnormality. Electronically Signed   By: Jasmine Pang M.D.   On: 12/10/2022 00:27   CT T-SPINE NO CHARGE  Result Date: 12/09/2022 CLINICAL DATA:  Trauma EXAM: CT Thoracic Spine without contrast TECHNIQUE: Multiplanar CT images of the thoracic spine were reconstructed from contemporary CT of the Chest. RADIATION DOSE REDUCTION: This exam was performed according to the departmental dose-optimization program which includes automated exposure control, adjustment of the mA and/or kV according to patient size and/or use of iterative reconstruction technique. CONTRAST:  None or No additional COMPARISON:  None Available. FINDINGS: Alignment: Thoracic alignment within normal limits. Vertebrae: Vertebral bodies demonstrate normal stature. No fracture. Paraspinal and other soft tissues: Negative. Disc levels: Partially visualized anterior fusion hardware in the lower cervical spine. Multilevel degenerative osteophytes of the thoracic spine. No abnormal disc space widening. IMPRESSION: Degenerative changes of the thoracic spine. No acute osseous abnormality. Electronically Signed   By: Jasmine Pang M.D.   On: 12/09/2022 23:03   DG Wrist Complete Right  Result Date: 12/09/2022 CLINICAL DATA:  Fall EXAM: RIGHT WRIST - COMPLETE 3+ VIEW COMPARISON:  None Available. FINDINGS: No fracture or malalignment. Mild  degenerative change at the first [facility] joint. Soft tissue swelling greatest adjacent to  the ulna. IMPRESSION: No acute osseous abnormality. Soft tissue swelling. Electronically Signed   By: Jasmine Pang M.D.   On: 12/09/2022 21:34   DG Tibia/Fibula Right  Result Date: 12/09/2022 CLINICAL DATA:  Fall EXAM: RIGHT TIBIA AND FIBULA - 2 VIEW COMPARISON:  12/17/2016 FINDINGS: No fracture or malalignment. Vascular calcifications. Soft tissue swelling. IMPRESSION: No acute osseous abnormality. Electronically Signed   By: Jasmine Pang M.D.   On: 12/09/2022 21:33   DG Tibia/Fibula Left  Result Date: 12/09/2022 CLINICAL DATA:  Fall EXAM: LEFT TIBIA AND FIBULA - 2 VIEW COMPARISON:  11/14/2010, 11/10/2010 FINDINGS: Surgical plate and fixating screws in the proximal tibia. No acute displaced fracture or malalignment. Generalized soft tissue edema. Old tibial plateau fractures IMPRESSION: Postsurgical changes of the proximal tibia with old tibial plateau fractures. No acute osseous abnormality. Electronically Signed   By: Jasmine Pang M.D.   On: 12/09/2022 21:32   DG Elbow 2 Views Right  Result Date: 12/09/2022 CLINICAL DATA:  Fall EXAM: RIGHT ELBOW - 2 VIEW COMPARISON:  None Available. FINDINGS: There is no evidence of fracture, dislocation, or joint effusion. There is no evidence of arthropathy or other focal bone abnormality. Soft tissues are unremarkable. IMPRESSION: Negative. Electronically Signed   By: Darliss Cheney M.D.   On: 12/09/2022 21:30      Subjective: States that she is just woke up. Reports sore all over. Indicates that her son does not cook for her but does take her out for meals, helps feed her at home. No other complaints reported.   Discharge Exam:  Vitals:   12/11/22 0026 12/11/22 0304 12/11/22 0800 12/11/22 1141  BP: (!) 142/52 (!) 171/48 (!) 182/50 (!) 155/46  Pulse:  60  78  Resp:  16 15 15   Temp:  98.9 F (37.2 C) 97.9 F (36.6 C) 97.8 F (36.6 C)  TempSrc:  Oral  Oral  SpO2:   97%  96%  Weight:        General exam: Very elderly female, moderately built and frail, lying comfortably propped up in bed without distress.  Oral mucosa moist. HEENT: Large laceration on the right temporal region, sutured.  Right periorbital ecchymosis but not significant swelling.  No subconjunctival hemorrhage.  Pupils equally reacting to light and accommodation.  Multiple bruises on the face.  Left frontal ecchymosis.  Dressing over the chin.  No change/stable Respiratory system: Clear to auscultation.  No increased work of breathing. Cardiovascular system: S1 & S2 heard, regular bradycardia.Marland Kitchen No JVD, murmurs, rubs, gallops or clicks. No pedal edema.  Telemetry discontinued 5/2. Gastrointestinal system: Abdomen is nondistended, soft and nontender. No organomegaly or masses felt. Normal bowel sounds heard.  RUQ extensive healed scars from possible prior open cholecystectomy.?  Midline suprapubic surgical scar. Central nervous system: Alert and oriented x 2. No focal neurological deficits. Extremities: Symmetric 5 x 5 power.  Dressing over bilateral forearms.  Bilateral leg/shin bruises, more so on the right.  Right shin may have small subcutaneous hematoma, monitor Skin: No rashes, lesions or ulcers Psychiatry: Judgement and insight appear somewhat impaired. Mood & affect appropriate    The results of significant diagnostics from this hospitalization (including imaging, microbiology, ancillary and laboratory) are listed below for reference.     Microbiology: No results found for this or any previous visit (from the past 240 hour(s)).   Labs: CBC: Recent Labs  Lab 12/09/22 2139 12/10/22 0252 12/11/22 0900  WBC 12.2* 8.6 5.6  HGB 9.8* 8.3* 9.1*  HCT 29.2* 24.8*  27.8*  MCV 94.5 94.3 94.9  PLT 271 224 234    Basic Metabolic Panel: Recent Labs  Lab 12/09/22 2139 12/10/22 0252  NA 133* 132*  K 4.7 4.6  CL 103 102  CO2 21* 22  GLUCOSE 127* 130*  BUN 17 18  CREATININE 1.51*  1.47*  CALCIUM 8.9 8.4*  MG  --  2.0  PHOS  --  3.6    CBG: Recent Labs  Lab 12/09/22 2148  GLUCAP 115*     Thyroid function studies Recent Labs    12/10/22 0252  TSH 5.128*    Urinalysis    Component Value Date/Time   COLORURINE YELLOW 12/09/2022 2050   APPEARANCEUR HAZY (A) 12/09/2022 2050   LABSPEC 1.011 12/09/2022 2050   PHURINE 5.0 12/09/2022 2050   GLUCOSEU NEGATIVE 12/09/2022 2050   GLUCOSEU NEGATIVE 07/08/2021 1146   HGBUR NEGATIVE 12/09/2022 2050   BILIRUBINUR NEGATIVE 12/09/2022 2050   BILIRUBINUR neg 09/22/2022 1648   KETONESUR NEGATIVE 12/09/2022 2050   PROTEINUR 100 (A) 12/09/2022 2050   UROBILINOGEN 0.2 09/22/2022 1648   UROBILINOGEN 0.2 07/08/2021 1146   NITRITE NEGATIVE 12/09/2022 2050   LEUKOCYTESUR MODERATE (A) 12/09/2022 2050    Unsuccessfully attempted to reach patient's son via phone yesterday and today at time of discharge.  Time coordinating discharge: 25 minutes  SIGNED:  Marcellus Scott, MD,  FACP, [facility], [facility], [facility], [facility]   [facility] [place]     To contact the attending provider between 7A-7P or the covering provider during after hours 7P-7A, please log into the web site [email or web address] and access using universal [place] password for that web site. If you do not have the password, please call the hospital operator.

## 2022-12-11 NOTE — Care Management Obs Status (Signed)
MEDICARE OBSERVATION STATUS NOTIFICATION   Patient Details  Name: VERNELLA STUDENT MRN: [id number] Date of Birth: 1928-08-25   Medicare Observation Status Notification Given:  Yes    Darrold Span, RN 12/11/2022, 11:40 AM

## 2022-12-11 NOTE — Plan of Care (Signed)
Discharging to [facility].

## 2022-12-11 NOTE — TOC Progression Note (Signed)
Transition of Care [facility]) - Progression Note    Patient Details  Name: [name] MRN: [id number] Date of Birth: 1928-12-31  Transition of Care [facility]) CM/SW Contact  Eduard Roux, [place] Phone Number: 12/11/2022, 2:20 PM  Clinical Narrative:     [facility] - received notice by email , patient is is [facility] [place] and would qualify for the waiver for SNF.   Spoke with patient's son and provided update.   [facility] confirmed bed availability and can admit today if they receives discharge summary by 3pm. Sent secured chat to MD- if not the patient can admit over the weekend.  TOC will continue to follow and assist with discharge planning.  Antony Blackbird, MSW, LCSW Clinical Social Worker    Expected Discharge Plan: Skilled Nursing Facility Barriers to Discharge: Barriers Resolved  Expected Discharge Plan and Services In-house Referral: Clinical Social Work     Living arrangements for the past 2 months: Single Family Home                                       Social Determinants of Health (SDOH) Interventions SDOH Screenings   Food Insecurity: No Food Insecurity (06/05/2022)  Housing: Low Risk  (09/25/2020)  Transportation Needs: No Transportation Needs (06/05/2022)  Alcohol Screen: Low Risk  (09/25/2020)  Depression (PHQ2-9): Low Risk  (06/05/2022)  Financial Resource Strain: Low Risk  (06/05/2022)  Physical Activity: Inactive (06/05/2022)  Social Connections: Moderately Isolated (09/25/2020)  Stress: Stress Concern Present (06/05/2022)  Tobacco Use: Medium Risk (12/09/2022)    Readmission Risk Interventions     No data to display

## 2022-12-11 NOTE — [place] FL2 (Signed)
[place] MEDICAID FL2 LEVEL OF CARE FORM     IDENTIFICATION  Patient Name: [name] Birthdate: April 08, 1929 Sex: female Admission Date (Current Location): 12/09/2022  [facility] and [place] Number:  Producer, television/film/video and Address:  [facility], [address], [place], [place] [address]      Provider Number: [id number]  Attending Physician Name and Address:  Elease Etienne, MD  Relative Name and Phone Number:       Current Level of Care: Hospital Recommended Level of Care: Skilled Nursing Facility Prior Approval Number:    Date Approved/Denied:   PASRR Number: [id number]  Discharge Plan: SNF    Current Diagnoses: Patient Active Problem List   Diagnosis Date Noted   Fall 12/10/2022   Right sided weakness 05/04/2022   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   CKD (chronic kidney disease) stage 3, GFR 30-59 ml/min (HCC) 03/31/2019   Anemia 03/07/2015   Transaminitis 11/20/2014   Hyponatremia 10/09/2014   Lightheaded 08/30/2014   Right hip pain 07/25/2014   Chronic neck pain 06/06/2014   Memory loss 06/23/2013   Right shoulder pain 06/23/2013   Eczema 01/16/2013   Leg edema 08/23/2012   Chronic insomnia 08/09/2012   Renal insufficiency 03/31/2011   CYSTOCELE WITHOUT MENTION UTERINE PROLAPSE LAT 08/29/2010   Anemia of chronic disease 05/06/2010   LIVER FUNCTION TESTS, ABNORMAL, HX OF 01/14/2009   Hypothyroidism 04/04/2007   Adjustment disorder with mixed anxiety and depressed mood 04/04/2007   Essential hypertension 04/04/2007    Orientation RESPIRATION BLADDER Height & Weight        Normal Continent Weight: 164 lb 10.9 oz (74.7 kg) Height:     BEHAVIORAL SYMPTOMS/MOOD NEUROLOGICAL BOWEL NUTRITION STATUS      Continent Diet (please see discharge summary)  AMBULATORY STATUS COMMUNICATION OF NEEDS Skin   Limited Assist Verbally Skin abrasions (abrasion neck & elbow , ecchymosis)                       Personal Care Assistance Level of  Assistance  Bathing, Feeding, Dressing Bathing Assistance: Limited assistance Feeding assistance: Limited assistance Dressing Assistance: Limited assistance     Functional Limitations Info  Sight, Hearing, Speech Sight Info: Impaired (wears glasses) Hearing Info: Impaired Speech Info: Adequate    SPECIAL CARE FACTORS FREQUENCY  PT (By licensed PT), OT (By licensed OT)     PT Frequency: 5x per week OT Frequency: 5x per week            Contractures Contractures Info: Not present    Additional Factors Info  Code Status, Allergies Code Status Info: FULL Allergies Info: Oxycodone Hcl           Current Medications (12/11/2022):  This is the current hospital active medication list Current Facility-Administered Medications  Medication Dose Route Frequency Provider Last Rate Last Admin   acetaminophen (TYLENOL) tablet 650 mg  650 mg Oral Q6H PRN Elease Etienne, MD   650 mg at 12/11/22 0341   Or   acetaminophen (TYLENOL) suppository 650 mg  650 mg Rectal Q6H PRN Elease Etienne, MD       amLODipine (NORVASC) tablet 10 mg  10 mg Oral Daily [place], Carole N, DO   10 mg at 12/11/22 0809   docusate sodium (COLACE) capsule 100 mg  100 mg Oral BID Solon Augusta S, PA   100 mg at 12/11/22 0810   donepezil (ARICEPT) tablet 10 mg  10 mg Oral QHS Hall,  Oliver Pila, DO   10 mg at 12/10/22 2124   enoxaparin (LOVENOX) injection 30 mg  30 mg Subcutaneous Q24H Dow Adolph N, DO   30 mg at 12/10/22 2125   hydrALAZINE (APRESOLINE) injection 5 mg  5 mg Intravenous Q6H PRN Elease Etienne, MD   5 mg at 12/10/22 2322   hydrALAZINE (APRESOLINE) tablet 25 mg  25 mg Oral Q8H [place]al[place], Anand D, MD   25 mg at 12/11/22 1346   irbesartan (AVAPRO) tablet 37.5 mg  37.5 mg Oral Daily Dow Adolph N, DO   37.5 mg at 12/11/22 6578   levothyroxine (SYNTHROID) tablet 150 mcg  150 mcg Oral Daily Darlin Drop, DO   150 mcg at 12/11/22 0553   memantine (NAMENDA) tablet 10 mg  10 mg Oral Daily Dow Adolph N, DO    10 mg at 12/11/22 0810   multivitamin (PROSIGHT) tablet 1 tablet  1 tablet Oral Q breakfast Darlin Drop, DO   1 tablet at 12/11/22 0809   multivitamin with minerals tablet 1 tablet  1 tablet Oral Q breakfast Dow Adolph N, DO   1 tablet at 12/10/22 0742   ondansetron (ZOFRAN) tablet 4 mg  4 mg Oral Q8H PRN Elease Etienne, MD   4 mg at 12/11/22 0932   pantoprazole (PROTONIX) EC tablet 40 mg  40 mg Oral Daily Dow Adolph N, DO   40 mg at 12/11/22 0809   polyethylene glycol (MIRALAX / GLYCOLAX) packet 17 g  17 g Oral Daily PRN Dow Adolph N, DO       polyvinyl alcohol (LIQUIFILM TEARS) 1.4 % ophthalmic solution 1 drop  1 drop Both Eyes TID PRN Hall, Carole N, DO       QUEtiapine (SEROQUEL) tablet 50 mg  50 mg Oral BID Dow Adolph N, DO   50 mg at 12/11/22 0810   sertraline (ZOLOFT) tablet 100 mg  100 mg Oral Daily Dow Adolph N, DO   100 mg at 12/11/22 0810   traZODone (DESYREL) tablet 25 mg  25 mg Oral QHS PRN Gailen Shelter, PA   25 mg at 12/10/22 2123     Discharge Medications: Please see discharge summary for a list of discharge medications.  Relevant Imaging Results:  Relevant Lab Results:   Additional Information SSN [id number]  Eduard Roux, LCSW

## 2022-12-11 NOTE — TOC Transition Note (Signed)
Transition of Care [facility]) - CM/SW Discharge Note   Patient Details  Name: [name] MRN: [id number] Date of Birth: [birth year]  [facility] of [facility]) CM/SW Contact:  Eduard Roux, LCSW Phone Number: 12/11/2022, 3:19 PM   Clinical Narrative:     Patient will Discharge to: [facility]  Discharge Date: 12/11/2022 Family Notified: son Transport By: Sharin Mons  Per MD patient is ready for discharge. RN, patient, and facility notified of discharge. Discharge Summary sent to facility. RN given number for report [phone]) [phone], Room 1108P. Ambulance transport requested for patient.   Clinical Social Worker signing off.  Antony Blackbird, MSW, LCSW Clinical Social Worker     Final next level of care: Skilled Nursing Facility Barriers to Discharge: Barriers Resolved   Patient Goals and CMS Choice      Discharge Placement                         Discharge Plan and Services Additional resources added to the After Visit Summary for   In-house Referral: Clinical Social Work                                   Social Determinants of Health (SDOH) Interventions SDOH Screenings   Food Insecurity: No Food Insecurity (06/05/2022)  Housing: Low Risk  (09/25/2020)  Transportation Needs: No Transportation Needs (06/05/2022)  Alcohol Screen: Low Risk  (09/25/2020)  Depression (PHQ2-9): Low Risk  (06/05/2022)  Financial Resource Strain: Low Risk  (06/05/2022)  Physical Activity: Inactive (06/05/2022)  Social Connections: Moderately Isolated (09/25/2020)  Stress: Stress Concern Present (06/05/2022)  Tobacco Use: Medium Risk (12/09/2022)     Readmission Risk Interventions     No data to display

## 2022-12-11 NOTE — Progress Notes (Signed)
Pt discharged to [facility] place SNF and report given to facility by prior RN Almira Coaster. Pt picked up by [facility] to be transport to disposition. Pt transported off unit via stretcher with belongings and son at side. Dionne Bucy RN

## 2022-12-11 NOTE — Care Management CC44 (Signed)
Condition Code 44 Documentation Completed  Patient Details  Name: [name] MRN: [id number] Date of Birth: May 26, 1929   Condition Code 44 given:  Yes Patient signature on Condition Code 44 notice:  Yes Documentation of 2 MD's agreement:  Yes Code 44 added to claim:  Yes    Darrold Span, RN 12/11/2022, 11:40 AM

## 2022-12-11 NOTE — Progress Notes (Signed)
Physical Therapy Treatment Patient Details Name: [name] MRN: [id number] DOB: 05/08/1929 Today's Date: 12/11/2022   History of Present Illness The pt is a 89 yo female presenting 5/1 after falling down a flight of stairs and was down for ~1 hr prior to getting assistance. PMH includes: HTN, CKD, Lewy body dementia, anemia, chronic fatigue, and hypothyroidism.    PT Comments    The pt was agreeable to session, able to demo improvement in power and stability to complete sit-stand transfer with less assistance. With use of RW, pt able to progress to minG at times, but does benefit from cues for hand placement and intermittent assist to stabilize RW as she tends to pull up on RW when not cued. The pt was also able to make great progress with hallway ambulation endurance, but needed minA to steady and direct mobility. Will continue to benefit from skilled PT acutely and after d/c to maximize return to independence with gait and transfers.     Recommendations for follow up therapy are one component of a multi-disciplinary discharge planning process, led by the attending physician.  Recommendations may be updated based on patient status, additional functional criteria and insurance authorization.  Follow Up Recommendations  Can patient physically be transported by private vehicle: Yes    Assistance Recommended at Discharge Frequent or constant Supervision/Assistance  Patient can return home with the following A little help with bathing/dressing/bathroom;Direct supervision/assist for medications management;Assistance with feeding;Assistance with cooking/housework;Direct supervision/assist for financial management;Assist for transportation;Help with stairs or ramp for entrance;A little help with walking and/or transfers   Equipment Recommendations  None recommended by PT    Recommendations for Other Services       Precautions / Restrictions Precautions Precautions: Fall Precaution Comments:  admitted after fall down stairs, dementia Restrictions Weight Bearing Restrictions: No     Mobility  Bed Mobility Overal bed mobility: Needs Assistance             General bed mobility comments: pt OOB with NT upon arrival    Transfers Overall transfer level: Needs assistance Equipment used: Rolling walker (2 wheels), 1 person hand held assist Transfers: Sit to/from Stand, Bed to chair/wheelchair/BSC Sit to Stand: Min assist, Min guard   Step pivot transfers: Min guard       General transfer comment: minA with HHA to rise, modA with HHA to step from [facility] to bed. minG to rise with RW btu cues for hand placement    Ambulation/Gait Ambulation/Gait assistance: Min assist Gait Distance (Feet): 75 Feet Assistive device: Rolling walker (2 wheels) Gait Pattern/deviations: Step-through pattern, Decreased stride length, Shuffle Gait velocity: decreased Gait velocity interpretation: <1.31 ft/sec, indicative of household ambulator   General Gait Details: small shuffling steps, pain in L knee with gait.     Balance Overall balance assessment: Needs assistance, History of Falls Sitting-balance support: No upper extremity supported, Feet supported Sitting balance-Leahy Scale: Good     Standing balance support: Bilateral upper extremity supported, During functional activity Standing balance-Leahy Scale: Fair Standing balance comment: able to static stand without UE support, BUE support for gait                            Cognition Arousal/Alertness: Awake/alert Behavior During Therapy: Flat affect Overall Cognitive Status: No family/caregiver present to determine baseline cognitive functioning Area of Impairment: Orientation, Attention, Memory, Following commands, Safety/judgement, Awareness, Problem solving  Orientation Level: Disoriented to, Time Current Attention Level: Selective Memory: Decreased short-term memory Following Commands:  Follows one step commands with increased time Safety/Judgement: Decreased awareness of safety, Decreased awareness of deficits Awareness: Emergent Problem Solving: Slow processing General Comments: pt with delayed processing and needs repeated cues/questions to attend to conversation. Pt frequently needing increased time and multiple errors to complete tasks, but is able to identify errors. suspect impacted by low vision        Exercises      General Comments General comments (skin integrity, edema, etc.): VSS on RA      Pertinent Vitals/Pain Pain Assessment Pain Assessment: Faces Faces Pain Scale: Hurts little more Pain Location: L knee with wt bearing Pain Descriptors / Indicators: Discomfort Pain Intervention(s): Monitored during session, Repositioned     PT Goals (current goals can now be found in the care plan section) Acute Rehab PT Goals Patient Stated Goal: return home PT Goal Formulation: With patient Time For Goal Achievement: 12/24/22 Potential to Achieve Goals: Good Progress towards PT goals: Progressing toward goals    Frequency    Min 3X/week      PT Plan Current plan remains appropriate       AM-PAC PT "6 Clicks" Mobility   Outcome Measure  Help needed turning from your back to your side while in a flat bed without using bedrails?: A Little Help needed moving from lying on your back to sitting on the side of a flat bed without using bedrails?: A Little Help needed moving to and from a bed to a chair (including a wheelchair)?: A Little Help needed standing up from a chair using your arms (e.g., wheelchair or bedside chair)?: A Little Help needed to walk in hospital room?: A Lot Help needed climbing 3-5 steps with a railing? : Total 6 Click Score: 15    End of Session Equipment Utilized During Treatment: Gait belt Activity Tolerance: Patient tolerated treatment well;Patient limited by fatigue Patient left: in chair;with call bell/phone within  reach;with chair alarm set Nurse Communication: Mobility status PT Visit Diagnosis: Other abnormalities of gait and mobility (R26.89);Repeated falls (R29.6);Muscle weakness (generalized) (M62.81);Unsteadiness on feet (R26.81)     Time: 1610-9604 PT Time Calculation (min) (ACUTE ONLY): 29 min  Charges:  $Gait Training: 8-22 mins $Therapeutic Exercise: 8-22 mins                     Vickki Muff, PT, DPT   Acute Rehabilitation Department Office [phone] Secure Chat Communication Preferred   Ronnie Derby 12/11/2022, 10:25 AM

## 2022-12-11 NOTE — Progress Notes (Signed)
PROGRESS NOTE   [name]  WUJ:[id number]    DOB: 1929/04/18    DOA: 12/09/2022  PCP: Shirline Frees, NP   I have briefly reviewed patients previous medical records in [facility].  Chief Complaint  Patient presents with   Fall    Brief Narrative:  89 year old female, claims to live alone and uses a walker to ambulate, medical history significant for dementia, hypothyroidism, hypertension, hyperlipidemia, stage IIIb CKD, anemia of chronic disease who presented to the ED on 12/09/2022 from home after an unwitnessed fall.  Extensive imaging negative for fractures.  Asymptomatic pyuria.  Awaiting PT and OT evaluation, may likely need SNF for STR, TOC consulted for SNF, medically ready for DC to SNF pending SNF bed and insurance authorization.   Assessment & Plan:  Principal Problem:   Fall   Unwitnessed mechanical fall at home: Patient reports that this is not her first fall.  Specifically denies LOC.  No fractures on extensive imaging.  Did have right facial laceration which has been sutured and multiple facial bruises, bilateral leg bruises.  Currently without pain.  Likely due to very advanced age, physical frailty/deconditioning.  Low index of suspicion for UTI.  No rhabdomyolysis.  PT, OT evaluated and recommend SNF.  TOC on board.  Fall precautions.  Multimodality pain control but minimize opioids.  Delirium precautions.  Has not used much pain medications except a dose of Tylenol early this morning.  Asymptomatic bacteriuria: Patient specifically denies dysuria, urinary frequency, urgency or suprapubic abdominal pain.  UA only shows pyuria without nitrites or bacteria.  Leukocytosis may be reactive.  Low index of suspicion for UTI and hence discontinued empirically started ceftriaxone.  Stage IIIb chronic kidney disease: Stable with creatinine at baseline.  Outpatient follow-up.  Hyponatremia, mild POA.  Not clinically relevant to current admission.  Acute on chronic anemia  of chronic disease: Baseline hemoglobin may be in the 9-10 g range.  Hemoglobin dropped from 9.8-8.3, may be due to acute blood loss and hemodilution.  Follow CBC in AM, labs ordered but not drawn yet discussed with RN  Dementia without behavioral abnormalities: Suspect mild or may be even moderate dementia.  Delirium precautions.  Continue Aricept and Namenda.  Also on Seroquel and sertraline, continue.  Hypothyroidism: Continue levothyroxine.  Clinically euthyroid.  TSH 5.128.  Checking free T3 and free T4.  Hypertension Continue amlodipine and irbesartan.  Mildly uncontrolled.  Added as needed IV hydralazine low-dose.  Added hydralazine 25 Mg 3 times daily.   Chronic anxiety/depression/chronic insomnia Continue home regimen.   GERD Continue home PPI.  Body mass index is 30.12 kg/m./Obesity   DVT prophylaxis: enoxaparin (LOVENOX) injection 30 mg Start: 12/10/22 2200 SCDs Start: 12/10/22 9562     Code Status: Full Code:  ACP Documents: None present. Family Communication: Left VM message for son 5/2. Disposition:  Observation appropriate.  Medically optimized for DC to SNF.     Consultants:     Procedures:     Antimicrobials:      Subjective:  States that she is just woke up.  Reports sore all over.  Indicates that her son does not cook for her but does take her out for meals, helps feed her at home.  No other complaints reported.  Objective:   Vitals:   12/10/22 2317 12/11/22 0026 12/11/22 0304 12/11/22 0800  BP: (!) 176/50 (!) 142/52 (!) 171/48 (!) 182/50  Pulse: 62  60   Resp: 18  16 15   Temp: 99.3 F (37.4  C)  98.9 F (37.2 C) 97.9 F (36.6 C)  TempSrc: Oral  Oral   SpO2: 98%  97%   Weight:        General exam: Very elderly female, moderately built and frail, lying comfortably propped up in bed without distress.  Oral mucosa moist. HEENT: Large laceration on the right temporal region, sutured.  Right periorbital ecchymosis but not significant  swelling.  No subconjunctival hemorrhage.  Pupils equally reacting to light and accommodation.  Multiple bruises on the face.  Left frontal ecchymosis.  Dressing over the chin.  No change/stable Respiratory system: Clear to auscultation.  No increased work of breathing. Cardiovascular system: S1 & S2 heard, regular bradycardia.Marland Kitchen No JVD, murmurs, rubs, gallops or clicks. No pedal edema.  Telemetry discontinued 5/2. Gastrointestinal system: Abdomen is nondistended, soft and nontender. No organomegaly or masses felt. Normal bowel sounds heard.  RUQ extensive healed scars from possible prior open cholecystectomy.?  Midline suprapubic surgical scar. Central nervous system: Alert and oriented x 2. No focal neurological deficits. Extremities: Symmetric 5 x 5 power.  Dressing over bilateral forearms.  Bilateral leg/shin bruises, more so on the right.  Right shin may have small subcutaneous hematoma, monitor Skin: No rashes, lesions or ulcers Psychiatry: Judgement and insight appear somewhat impaired. Mood & affect appropriate.     Data Reviewed:   I have personally reviewed following labs and imaging studies   CBC: Recent Labs  Lab 12/09/22 2139 12/10/22 0252  WBC 12.2* 8.6  HGB 9.8* 8.3*  HCT 29.2* 24.8*  MCV 94.5 94.3  PLT 271 224    Basic Metabolic Panel: Recent Labs  Lab 12/09/22 2139 12/10/22 0252  NA 133* 132*  K 4.7 4.6  CL 103 102  CO2 21* 22  GLUCOSE 127* 130*  BUN 17 18  CREATININE 1.51* 1.47*  CALCIUM 8.9 8.4*  MG  --  2.0  PHOS  --  3.6    Liver Function Tests: No results for input(s): "AST", "ALT", "ALKPHOS", "BILITOT", "PROT", "ALBUMIN" in the last 168 hours.  CBG: Recent Labs  Lab 12/09/22 2148  GLUCAP 115*    Microbiology Studies:  No results found for this or any previous visit (from the past 240 hour(s)).  Radiology Studies:  CT HEAD WO CONTRAST  Addendum Date: 12/10/2022   ADDENDUM REPORT: 12/10/2022 01:54 ADDENDUM: The interval cervical fusion,  new from prior studies was actually at C6-7, not at C5-6. Electronically Signed   By: Almira Bar M.D.   On: 12/10/2022 01:54   Result Date: 12/10/2022 CLINICAL DATA:  89 year old with blunt polytrauma, unspecified trauma mechanism. EXAM: CT HEAD WITHOUT CONTRAST CT MAXILLOFACIAL WITHOUT CONTRAST CT CERVICAL SPINE WITHOUT CONTRAST CT CHEST, ABDOMEN AND PELVIS WITHOUT CONTRAST TECHNIQUE: Contiguous axial images were obtained from the base of the skull through the vertex without intravenous contrast. Multidetector CT imaging of the maxillofacial structures was performed. Multiplanar CT image reconstructions were also generated. A small metallic BB was placed on the right temple in order to reliably differentiate right from left. Multidetector CT imaging of the cervical spine was performed without intravenous contrast. Multiplanar CT image reconstructions were also generated. Multidetector CT imaging of the chest, abdomen and pelvis was performed without administration of intravenous contrast. RADIATION DOSE REDUCTION: This exam was performed according to the departmental dose-optimization program which includes automated exposure control, adjustment of the mA and/or kV according to patient size and/or use of iterative reconstruction technique. COMPARISON:  CT scan head 05/05/2022, MRI cervical spine 04/13/2010, CT abdomen pelvis  with contrast 02/14/2016. Chest PA and lateral 12/17/2016 FINDINGS: CT HEAD FINDINGS Brain: There is mild global atrophy for age, mild atrophic ventriculomegaly and small-vessel disease. No infarct, hemorrhage or mass are seen. There is no midline shift. Basal cisterns are clear. Vascular: There are calcifications in the siphons, distal vertebral and basilar arteries. No hyperdense central vessels. Skull: No fracture or focal lesions are seen. There is left lateral high parietal scalp swelling. Other: None. CT MAXILLOFACIAL FINDINGS Osseous: The facial study is motion compromised. There is  no obvious displaced fracture but a subtle fracture would be missed. No mandibular dislocation. Orbits: No traumatic or inflammatory finding is seen through the motion artifacts. There are old lens replacements. Sinuses: No significant sinus disease is seen through the motion artifacts. The mastoid air cells are clear. The ostiomeatal complexes are patent. Nasal septum is midline. Soft tissues: There is a skin laceration in the lateral right periorbital area with mild swelling. The epiglottis and tongue base are normal. CT CERVICAL SPINE FINDINGS Alignment: There is a chronic trace degenerative anterolisthesis at C5-6, slight levoscoliosis, and again noted bone-on-bone anterior atlantodental joint space loss with osteophytes and sclerosis of the dens. There is no new or acute traumatic malalignment. Skull base and vertebrae: Old anterior plate fusion Z6-1 with solid arthrodesis is again noted. There has been interval C5-6 anterior plate fusion with solid arthrodesis at this level as well with the facet joints remaining patent. There is osteopenia with no evidence of fractures or primary pathologic process. There are small endplate Schmorl's nodes at C2-3. Soft tissues and spinal canal: No prevertebral fluid or swelling. No visible canal hematoma. There is no laryngeal mass. There is heavy calcification in the proximal cervical ICAs. There is advanced fatty replacement of the parotid glands, moderate fatty replacement of the submandibular gland. Disc levels: Partial disc space loss noted C2-3 and C3-4. Normal disc height at C5-6 and C7-T1. Fused disc spaces C4-5 and C6-7. Metal artifact from the fusion hardware does limit evaluation of the spinal canal but there is no obvious herniated disc or cord compromise. There is trace marginal endplate ridging at C5-6 but no significant disc osteophyte complex. Facet joint and uncinate spurring is seen at most levels, with foraminal stenosis which is bilaterally severe C3-4,  moderate on the right and mild on the left at C4-5, moderate on the right at C5-6. Remaining foramina are clear. Other: None. CT CHEST FINDINGS Cardiovascular: The cardiac size is normal. The coronary arteries are heavily calcified. There is no pericardial effusion. There is mild tortuosity and moderate to heavy calcification in the thoracic aorta, patchy calcific plaques in the great vessels. There is no aortic aneurysm. The pulmonary arteries and veins are normal in caliber. Mediastinum/Nodes: Thyroid gland is atrophic, small in size without mass. No axillary or intrathoracic adenopathy are seen. Small hiatal hernia. There are calcified precarinal lymph nodes to the right. Lungs/Pleura: Calcified granuloma right upper lobe. Reticulated scarring both lung apices symmetrically. The lungs are otherwise clear. There is no pleural effusion, thickening or pneumothorax. Mild chronic elevation right hemidiaphragm. Musculoskeletal: There is osteopenia with degenerative changes of the first spine, mild kyphodextroscoliosis. No spinal compression fractures are seen. The ribs, sternum and visualized shoulder girdles show no displaced fractures. CT ABDOMEN AND PELVIS FINDINGS Hepatobiliary: Chronic postcholecystectomy intrahepatic and extrahepatic bile duct dilatation. The common bile duct is stable in caliber 16.2 mm. No ductal filling defect is seen. No focal abnormality in the unenhanced liver Pancreas: No abnormality is seen without contrast. Spleen:  No abnormality is seen without contrast. A 1.3 cm calcified splenic artery aneurysm is again noted along the lower splenic hilum. Adrenals/Urinary Tract: Chronic perinephric stranding appears similar. There is no adrenal mass. Unremarkable unenhanced renal cortex again noted. There are no urinary stones or obstructive uropathy. There is no bladder thickening. Stomach/Bowel: No dilatation or wall thickening. An appendix is not seen. There is diffuse colonic diverticulosis most  advanced in the sigmoid segment but no evidence of focal colitis or diverticulitis. Vascular/Lymphatic: Heavy aortic and branch vessel atherosclerosis. No AAA. No MEF. Reproductive: Status post hysterectomy. No adnexal masses. Other: Pelvic floor laxity is again seen but the prior demonstration of a cystocele is not noted today. There is no free air, free hemorrhage or free fluid. There are no incarcerated hernias. Scattered pelvic phleboliths. Musculoskeletal: There is osteopenia and degenerative change of the lumbar spine. Hemangiomatous replacement L2 vertebral body. There are chronic laminectomy defects at L3 and 4. No regional skeletal fracture is seen. L5 is transitional with bilateral fused sacral articulations. IMPRESSION: 1. No acute intracranial CT findings or depressed skull fractures. Left lateral high parietal scalp swelling. 2. Motion limited maxillofacial CT with no obvious displaced fractures. Facial laceration lateral right periorbital area. 3. Osteopenia and degenerative changes without evidence of cervical fractures. 4. Interval C5-6 anterior plate fusion with solid arthrodesis. Old C4-5 mature fusion. 5. No acute trauma related findings in the chest, abdomen or pelvis. 6. Carotid, aortic and coronary artery atherosclerosis. 7. Diverticulosis without diverticulitis. 8. Pelvic floor laxity without evidence of cystocele. 9. Chronic post cholecystectomy intrahepatic and extrahepatic biliary dilatation. Electronically Signed: By: Almira Bar M.D. On: 12/09/2022 23:26   CT MAXILLOFACIAL WO CONTRAST  Addendum Date: 12/10/2022   ADDENDUM REPORT: 12/10/2022 01:54 ADDENDUM: The interval cervical fusion, new from prior studies was actually at C6-7, not at C5-6. Electronically Signed   By: Almira Bar M.D.   On: 12/10/2022 01:54   Result Date: 12/10/2022 CLINICAL DATA:  89 year old with blunt polytrauma, unspecified trauma mechanism. EXAM: CT HEAD WITHOUT CONTRAST CT MAXILLOFACIAL WITHOUT CONTRAST  CT CERVICAL SPINE WITHOUT CONTRAST CT CHEST, ABDOMEN AND PELVIS WITHOUT CONTRAST TECHNIQUE: Contiguous axial images were obtained from the base of the skull through the vertex without intravenous contrast. Multidetector CT imaging of the maxillofacial structures was performed. Multiplanar CT image reconstructions were also generated. A small metallic BB was placed on the right temple in order to reliably differentiate right from left. Multidetector CT imaging of the cervical spine was performed without intravenous contrast. Multiplanar CT image reconstructions were also generated. Multidetector CT imaging of the chest, abdomen and pelvis was performed without administration of intravenous contrast. RADIATION DOSE REDUCTION: This exam was performed according to the departmental dose-optimization program which includes automated exposure control, adjustment of the mA and/or kV according to patient size and/or use of iterative reconstruction technique. COMPARISON:  CT scan head 05/05/2022, MRI cervical spine 04/13/2010, CT abdomen pelvis with contrast 02/14/2016. Chest PA and lateral 12/17/2016 FINDINGS: CT HEAD FINDINGS Brain: There is mild global atrophy for age, mild atrophic ventriculomegaly and small-vessel disease. No infarct, hemorrhage or mass are seen. There is no midline shift. Basal cisterns are clear. Vascular: There are calcifications in the siphons, distal vertebral and basilar arteries. No hyperdense central vessels. Skull: No fracture or focal lesions are seen. There is left lateral high parietal scalp swelling. Other: None. CT MAXILLOFACIAL FINDINGS Osseous: The facial study is motion compromised. There is no obvious displaced fracture but a subtle fracture would be missed. No mandibular  dislocation. Orbits: No traumatic or inflammatory finding is seen through the motion artifacts. There are old lens replacements. Sinuses: No significant sinus disease is seen through the motion artifacts. The mastoid  air cells are clear. The ostiomeatal complexes are patent. Nasal septum is midline. Soft tissues: There is a skin laceration in the lateral right periorbital area with mild swelling. The epiglottis and tongue base are normal. CT CERVICAL SPINE FINDINGS Alignment: There is a chronic trace degenerative anterolisthesis at C5-6, slight levoscoliosis, and again noted bone-on-bone anterior atlantodental joint space loss with osteophytes and sclerosis of the dens. There is no new or acute traumatic malalignment. Skull base and vertebrae: Old anterior plate fusion Z6-1 with solid arthrodesis is again noted. There has been interval C5-6 anterior plate fusion with solid arthrodesis at this level as well with the facet joints remaining patent. There is osteopenia with no evidence of fractures or primary pathologic process. There are small endplate Schmorl's nodes at C2-3. Soft tissues and spinal canal: No prevertebral fluid or swelling. No visible canal hematoma. There is no laryngeal mass. There is heavy calcification in the proximal cervical ICAs. There is advanced fatty replacement of the parotid glands, moderate fatty replacement of the submandibular gland. Disc levels: Partial disc space loss noted C2-3 and C3-4. Normal disc height at C5-6 and C7-T1. Fused disc spaces C4-5 and C6-7. Metal artifact from the fusion hardware does limit evaluation of the spinal canal but there is no obvious herniated disc or cord compromise. There is trace marginal endplate ridging at C5-6 but no significant disc osteophyte complex. Facet joint and uncinate spurring is seen at most levels, with foraminal stenosis which is bilaterally severe C3-4, moderate on the right and mild on the left at C4-5, moderate on the right at C5-6. Remaining foramina are clear. Other: None. CT CHEST FINDINGS Cardiovascular: The cardiac size is normal. The coronary arteries are heavily calcified. There is no pericardial effusion. There is mild tortuosity and  moderate to heavy calcification in the thoracic aorta, patchy calcific plaques in the great vessels. There is no aortic aneurysm. The pulmonary arteries and veins are normal in caliber. Mediastinum/Nodes: Thyroid gland is atrophic, small in size without mass. No axillary or intrathoracic adenopathy are seen. Small hiatal hernia. There are calcified precarinal lymph nodes to the right. Lungs/Pleura: Calcified granuloma right upper lobe. Reticulated scarring both lung apices symmetrically. The lungs are otherwise clear. There is no pleural effusion, thickening or pneumothorax. Mild chronic elevation right hemidiaphragm. Musculoskeletal: There is osteopenia with degenerative changes of the first spine, mild kyphodextroscoliosis. No spinal compression fractures are seen. The ribs, sternum and visualized shoulder girdles show no displaced fractures. CT ABDOMEN AND PELVIS FINDINGS Hepatobiliary: Chronic postcholecystectomy intrahepatic and extrahepatic bile duct dilatation. The common bile duct is stable in caliber 16.2 mm. No ductal filling defect is seen. No focal abnormality in the unenhanced liver Pancreas: No abnormality is seen without contrast. Spleen: No abnormality is seen without contrast. A 1.3 cm calcified splenic artery aneurysm is again noted along the lower splenic hilum. Adrenals/Urinary Tract: Chronic perinephric stranding appears similar. There is no adrenal mass. Unremarkable unenhanced renal cortex again noted. There are no urinary stones or obstructive uropathy. There is no bladder thickening. Stomach/Bowel: No dilatation or wall thickening. An appendix is not seen. There is diffuse colonic diverticulosis most advanced in the sigmoid segment but no evidence of focal colitis or diverticulitis. Vascular/Lymphatic: Heavy aortic and branch vessel atherosclerosis. No AAA. No MEF. Reproductive: Status post hysterectomy. No adnexal masses. Other:  Pelvic floor laxity is again seen but the prior demonstration  of a cystocele is not noted today. There is no free air, free hemorrhage or free fluid. There are no incarcerated hernias. Scattered pelvic phleboliths. Musculoskeletal: There is osteopenia and degenerative change of the lumbar spine. Hemangiomatous replacement L2 vertebral body. There are chronic laminectomy defects at L3 and 4. No regional skeletal fracture is seen. L5 is transitional with bilateral fused sacral articulations. IMPRESSION: 1. No acute intracranial CT findings or depressed skull fractures. Left lateral high parietal scalp swelling. 2. Motion limited maxillofacial CT with no obvious displaced fractures. Facial laceration lateral right periorbital area. 3. Osteopenia and degenerative changes without evidence of cervical fractures. 4. Interval C5-6 anterior plate fusion with solid arthrodesis. Old C4-5 mature fusion. 5. No acute trauma related findings in the chest, abdomen or pelvis. 6. Carotid, aortic and coronary artery atherosclerosis. 7. Diverticulosis without diverticulitis. 8. Pelvic floor laxity without evidence of cystocele. 9. Chronic post cholecystectomy intrahepatic and extrahepatic biliary dilatation. Electronically Signed: By: Almira Bar M.D. On: 12/09/2022 23:26   CT CERVICAL SPINE WO CONTRAST  Addendum Date: 12/10/2022   ADDENDUM REPORT: 12/10/2022 01:54 ADDENDUM: The interval cervical fusion, new from prior studies was actually at C6-7, not at C5-6. Electronically Signed   By: Almira Bar M.D.   On: 12/10/2022 01:54   Result Date: 12/10/2022 CLINICAL DATA:  89 year old with blunt polytrauma, unspecified trauma mechanism. EXAM: CT HEAD WITHOUT CONTRAST CT MAXILLOFACIAL WITHOUT CONTRAST CT CERVICAL SPINE WITHOUT CONTRAST CT CHEST, ABDOMEN AND PELVIS WITHOUT CONTRAST TECHNIQUE: Contiguous axial images were obtained from the base of the skull through the vertex without intravenous contrast. Multidetector CT imaging of the maxillofacial structures was performed. Multiplanar CT  image reconstructions were also generated. A small metallic BB was placed on the right temple in order to reliably differentiate right from left. Multidetector CT imaging of the cervical spine was performed without intravenous contrast. Multiplanar CT image reconstructions were also generated. Multidetector CT imaging of the chest, abdomen and pelvis was performed without administration of intravenous contrast. RADIATION DOSE REDUCTION: This exam was performed according to the departmental dose-optimization program which includes automated exposure control, adjustment of the mA and/or kV according to patient size and/or use of iterative reconstruction technique. COMPARISON:  CT scan head 05/05/2022, MRI cervical spine 04/13/2010, CT abdomen pelvis with contrast 02/14/2016. Chest PA and lateral 12/17/2016 FINDINGS: CT HEAD FINDINGS Brain: There is mild global atrophy for age, mild atrophic ventriculomegaly and small-vessel disease. No infarct, hemorrhage or mass are seen. There is no midline shift. Basal cisterns are clear. Vascular: There are calcifications in the siphons, distal vertebral and basilar arteries. No hyperdense central vessels. Skull: No fracture or focal lesions are seen. There is left lateral high parietal scalp swelling. Other: None. CT MAXILLOFACIAL FINDINGS Osseous: The facial study is motion compromised. There is no obvious displaced fracture but a subtle fracture would be missed. No mandibular dislocation. Orbits: No traumatic or inflammatory finding is seen through the motion artifacts. There are old lens replacements. Sinuses: No significant sinus disease is seen through the motion artifacts. The mastoid air cells are clear. The ostiomeatal complexes are patent. Nasal septum is midline. Soft tissues: There is a skin laceration in the lateral right periorbital area with mild swelling. The epiglottis and tongue base are normal. CT CERVICAL SPINE FINDINGS Alignment: There is a chronic trace  degenerative anterolisthesis at C5-6, slight levoscoliosis, and again noted bone-on-bone anterior atlantodental joint space loss with osteophytes and sclerosis of the dens.  There is no new or acute traumatic malalignment. Skull base and vertebrae: Old anterior plate fusion U9-8 with solid arthrodesis is again noted. There has been interval C5-6 anterior plate fusion with solid arthrodesis at this level as well with the facet joints remaining patent. There is osteopenia with no evidence of fractures or primary pathologic process. There are small endplate Schmorl's nodes at C2-3. Soft tissues and spinal canal: No prevertebral fluid or swelling. No visible canal hematoma. There is no laryngeal mass. There is heavy calcification in the proximal cervical ICAs. There is advanced fatty replacement of the parotid glands, moderate fatty replacement of the submandibular gland. Disc levels: Partial disc space loss noted C2-3 and C3-4. Normal disc height at C5-6 and C7-T1. Fused disc spaces C4-5 and C6-7. Metal artifact from the fusion hardware does limit evaluation of the spinal canal but there is no obvious herniated disc or cord compromise. There is trace marginal endplate ridging at C5-6 but no significant disc osteophyte complex. Facet joint and uncinate spurring is seen at most levels, with foraminal stenosis which is bilaterally severe C3-4, moderate on the right and mild on the left at C4-5, moderate on the right at C5-6. Remaining foramina are clear. Other: None. CT CHEST FINDINGS Cardiovascular: The cardiac size is normal. The coronary arteries are heavily calcified. There is no pericardial effusion. There is mild tortuosity and moderate to heavy calcification in the thoracic aorta, patchy calcific plaques in the great vessels. There is no aortic aneurysm. The pulmonary arteries and veins are normal in caliber. Mediastinum/Nodes: Thyroid gland is atrophic, small in size without mass. No axillary or intrathoracic  adenopathy are seen. Small hiatal hernia. There are calcified precarinal lymph nodes to the right. Lungs/Pleura: Calcified granuloma right upper lobe. Reticulated scarring both lung apices symmetrically. The lungs are otherwise clear. There is no pleural effusion, thickening or pneumothorax. Mild chronic elevation right hemidiaphragm. Musculoskeletal: There is osteopenia with degenerative changes of the first spine, mild kyphodextroscoliosis. No spinal compression fractures are seen. The ribs, sternum and visualized shoulder girdles show no displaced fractures. CT ABDOMEN AND PELVIS FINDINGS Hepatobiliary: Chronic postcholecystectomy intrahepatic and extrahepatic bile duct dilatation. The common bile duct is stable in caliber 16.2 mm. No ductal filling defect is seen. No focal abnormality in the unenhanced liver Pancreas: No abnormality is seen without contrast. Spleen: No abnormality is seen without contrast. A 1.3 cm calcified splenic artery aneurysm is again noted along the lower splenic hilum. Adrenals/Urinary Tract: Chronic perinephric stranding appears similar. There is no adrenal mass. Unremarkable unenhanced renal cortex again noted. There are no urinary stones or obstructive uropathy. There is no bladder thickening. Stomach/Bowel: No dilatation or wall thickening. An appendix is not seen. There is diffuse colonic diverticulosis most advanced in the sigmoid segment but no evidence of focal colitis or diverticulitis. Vascular/Lymphatic: Heavy aortic and branch vessel atherosclerosis. No AAA. No MEF. Reproductive: Status post hysterectomy. No adnexal masses. Other: Pelvic floor laxity is again seen but the prior demonstration of a cystocele is not noted today. There is no free air, free hemorrhage or free fluid. There are no incarcerated hernias. Scattered pelvic phleboliths. Musculoskeletal: There is osteopenia and degenerative change of the lumbar spine. Hemangiomatous replacement L2 vertebral body. There  are chronic laminectomy defects at L3 and 4. No regional skeletal fracture is seen. L5 is transitional with bilateral fused sacral articulations. IMPRESSION: 1. No acute intracranial CT findings or depressed skull fractures. Left lateral high parietal scalp swelling. 2. Motion limited maxillofacial CT with no obvious  displaced fractures. Facial laceration lateral right periorbital area. 3. Osteopenia and degenerative changes without evidence of cervical fractures. 4. Interval C5-6 anterior plate fusion with solid arthrodesis. Old C4-5 mature fusion. 5. No acute trauma related findings in the chest, abdomen or pelvis. 6. Carotid, aortic and coronary artery atherosclerosis. 7. Diverticulosis without diverticulitis. 8. Pelvic floor laxity without evidence of cystocele. 9. Chronic post cholecystectomy intrahepatic and extrahepatic biliary dilatation. Electronically Signed: By: Almira Bar M.D. On: 12/09/2022 23:26   CT CHEST ABDOMEN PELVIS WO CONTRAST  Addendum Date: 12/10/2022   ADDENDUM REPORT: 12/10/2022 01:54 ADDENDUM: The interval cervical fusion, new from prior studies was actually at C6-7, not at C5-6. Electronically Signed   By: Almira Bar M.D.   On: 12/10/2022 01:54   Result Date: 12/10/2022 CLINICAL DATA:  89 year old with blunt polytrauma, unspecified trauma mechanism. EXAM: CT HEAD WITHOUT CONTRAST CT MAXILLOFACIAL WITHOUT CONTRAST CT CERVICAL SPINE WITHOUT CONTRAST CT CHEST, ABDOMEN AND PELVIS WITHOUT CONTRAST TECHNIQUE: Contiguous axial images were obtained from the base of the skull through the vertex without intravenous contrast. Multidetector CT imaging of the maxillofacial structures was performed. Multiplanar CT image reconstructions were also generated. A small metallic BB was placed on the right temple in order to reliably differentiate right from left. Multidetector CT imaging of the cervical spine was performed without intravenous contrast. Multiplanar CT image reconstructions were also  generated. Multidetector CT imaging of the chest, abdomen and pelvis was performed without administration of intravenous contrast. RADIATION DOSE REDUCTION: This exam was performed according to the departmental dose-optimization program which includes automated exposure control, adjustment of the mA and/or kV according to patient size and/or use of iterative reconstruction technique. COMPARISON:  CT scan head 05/05/2022, MRI cervical spine 04/13/2010, CT abdomen pelvis with contrast 02/14/2016. Chest PA and lateral 12/17/2016 FINDINGS: CT HEAD FINDINGS Brain: There is mild global atrophy for age, mild atrophic ventriculomegaly and small-vessel disease. No infarct, hemorrhage or mass are seen. There is no midline shift. Basal cisterns are clear. Vascular: There are calcifications in the siphons, distal vertebral and basilar arteries. No hyperdense central vessels. Skull: No fracture or focal lesions are seen. There is left lateral high parietal scalp swelling. Other: None. CT MAXILLOFACIAL FINDINGS Osseous: The facial study is motion compromised. There is no obvious displaced fracture but a subtle fracture would be missed. No mandibular dislocation. Orbits: No traumatic or inflammatory finding is seen through the motion artifacts. There are old lens replacements. Sinuses: No significant sinus disease is seen through the motion artifacts. The mastoid air cells are clear. The ostiomeatal complexes are patent. Nasal septum is midline. Soft tissues: There is a skin laceration in the lateral right periorbital area with mild swelling. The epiglottis and tongue base are normal. CT CERVICAL SPINE FINDINGS Alignment: There is a chronic trace degenerative anterolisthesis at C5-6, slight levoscoliosis, and again noted bone-on-bone anterior atlantodental joint space loss with osteophytes and sclerosis of the dens. There is no new or acute traumatic malalignment. Skull base and vertebrae: Old anterior plate fusion N8-2 with solid  arthrodesis is again noted. There has been interval C5-6 anterior plate fusion with solid arthrodesis at this level as well with the facet joints remaining patent. There is osteopenia with no evidence of fractures or primary pathologic process. There are small endplate Schmorl's nodes at C2-3. Soft tissues and spinal canal: No prevertebral fluid or swelling. No visible canal hematoma. There is no laryngeal mass. There is heavy calcification in the proximal cervical ICAs. There is advanced fatty replacement of  the parotid glands, moderate fatty replacement of the submandibular gland. Disc levels: Partial disc space loss noted C2-3 and C3-4. Normal disc height at C5-6 and C7-T1. Fused disc spaces C4-5 and C6-7. Metal artifact from the fusion hardware does limit evaluation of the spinal canal but there is no obvious herniated disc or cord compromise. There is trace marginal endplate ridging at C5-6 but no significant disc osteophyte complex. Facet joint and uncinate spurring is seen at most levels, with foraminal stenosis which is bilaterally severe C3-4, moderate on the right and mild on the left at C4-5, moderate on the right at C5-6. Remaining foramina are clear. Other: None. CT CHEST FINDINGS Cardiovascular: The cardiac size is normal. The coronary arteries are heavily calcified. There is no pericardial effusion. There is mild tortuosity and moderate to heavy calcification in the thoracic aorta, patchy calcific plaques in the great vessels. There is no aortic aneurysm. The pulmonary arteries and veins are normal in caliber. Mediastinum/Nodes: Thyroid gland is atrophic, small in size without mass. No axillary or intrathoracic adenopathy are seen. Small hiatal hernia. There are calcified precarinal lymph nodes to the right. Lungs/Pleura: Calcified granuloma right upper lobe. Reticulated scarring both lung apices symmetrically. The lungs are otherwise clear. There is no pleural effusion, thickening or pneumothorax.  Mild chronic elevation right hemidiaphragm. Musculoskeletal: There is osteopenia with degenerative changes of the first spine, mild kyphodextroscoliosis. No spinal compression fractures are seen. The ribs, sternum and visualized shoulder girdles show no displaced fractures. CT ABDOMEN AND PELVIS FINDINGS Hepatobiliary: Chronic postcholecystectomy intrahepatic and extrahepatic bile duct dilatation. The common bile duct is stable in caliber 16.2 mm. No ductal filling defect is seen. No focal abnormality in the unenhanced liver Pancreas: No abnormality is seen without contrast. Spleen: No abnormality is seen without contrast. A 1.3 cm calcified splenic artery aneurysm is again noted along the lower splenic hilum. Adrenals/Urinary Tract: Chronic perinephric stranding appears similar. There is no adrenal mass. Unremarkable unenhanced renal cortex again noted. There are no urinary stones or obstructive uropathy. There is no bladder thickening. Stomach/Bowel: No dilatation or wall thickening. An appendix is not seen. There is diffuse colonic diverticulosis most advanced in the sigmoid segment but no evidence of focal colitis or diverticulitis. Vascular/Lymphatic: Heavy aortic and branch vessel atherosclerosis. No AAA. No MEF. Reproductive: Status post hysterectomy. No adnexal masses. Other: Pelvic floor laxity is again seen but the prior demonstration of a cystocele is not noted today. There is no free air, free hemorrhage or free fluid. There are no incarcerated hernias. Scattered pelvic phleboliths. Musculoskeletal: There is osteopenia and degenerative change of the lumbar spine. Hemangiomatous replacement L2 vertebral body. There are chronic laminectomy defects at L3 and 4. No regional skeletal fracture is seen. L5 is transitional with bilateral fused sacral articulations. IMPRESSION: 1. No acute intracranial CT findings or depressed skull fractures. Left lateral high parietal scalp swelling. 2. Motion limited  maxillofacial CT with no obvious displaced fractures. Facial laceration lateral right periorbital area. 3. Osteopenia and degenerative changes without evidence of cervical fractures. 4. Interval C5-6 anterior plate fusion with solid arthrodesis. Old C4-5 mature fusion. 5. No acute trauma related findings in the chest, abdomen or pelvis. 6. Carotid, aortic and coronary artery atherosclerosis. 7. Diverticulosis without diverticulitis. 8. Pelvic floor laxity without evidence of cystocele. 9. Chronic post cholecystectomy intrahepatic and extrahepatic biliary dilatation. Electronically Signed: By: Almira Bar M.D. On: 12/09/2022 23:26   DG Forearm Left  Result Date: 12/10/2022 CLINICAL DATA:  Fall forearm pain EXAM: LEFT FOREARM -  2 VIEW COMPARISON:  None Available. FINDINGS: No fracture or malalignment. Degenerative changes at the first [facility] joint. IMPRESSION: No acute osseous abnormality. Electronically Signed   By: Jasmine Pang M.D.   On: 12/10/2022 00:27   CT T-SPINE NO CHARGE  Result Date: 12/09/2022 CLINICAL DATA:  Trauma EXAM: CT Thoracic Spine without contrast TECHNIQUE: Multiplanar CT images of the thoracic spine were reconstructed from contemporary CT of the Chest. RADIATION DOSE REDUCTION: This exam was performed according to the departmental dose-optimization program which includes automated exposure control, adjustment of the mA and/or kV according to patient size and/or use of iterative reconstruction technique. CONTRAST:  None or No additional COMPARISON:  None Available. FINDINGS: Alignment: Thoracic alignment within normal limits. Vertebrae: Vertebral bodies demonstrate normal stature. No fracture. Paraspinal and other soft tissues: Negative. Disc levels: Partially visualized anterior fusion hardware in the lower cervical spine. Multilevel degenerative osteophytes of the thoracic spine. No abnormal disc space widening. IMPRESSION: Degenerative changes of the thoracic spine. No acute osseous  abnormality. Electronically Signed   By: Jasmine Pang M.D.   On: 12/09/2022 23:03   DG Wrist Complete Right  Result Date: 12/09/2022 CLINICAL DATA:  Fall EXAM: RIGHT WRIST - COMPLETE 3+ VIEW COMPARISON:  None Available. FINDINGS: No fracture or malalignment. Mild degenerative change at the first [facility] joint. Soft tissue swelling greatest adjacent to the ulna. IMPRESSION: No acute osseous abnormality. Soft tissue swelling. Electronically Signed   By: Jasmine Pang M.D.   On: 12/09/2022 21:34   DG Tibia/Fibula Right  Result Date: 12/09/2022 CLINICAL DATA:  Fall EXAM: RIGHT TIBIA AND FIBULA - 2 VIEW COMPARISON:  12/17/2016 FINDINGS: No fracture or malalignment. Vascular calcifications. Soft tissue swelling. IMPRESSION: No acute osseous abnormality. Electronically Signed   By: Jasmine Pang M.D.   On: 12/09/2022 21:33   DG Tibia/Fibula Left  Result Date: 12/09/2022 CLINICAL DATA:  Fall EXAM: LEFT TIBIA AND FIBULA - 2 VIEW COMPARISON:  11/14/2010, 11/10/2010 FINDINGS: Surgical plate and fixating screws in the proximal tibia. No acute displaced fracture or malalignment. Generalized soft tissue edema. Old tibial plateau fractures IMPRESSION: Postsurgical changes of the proximal tibia with old tibial plateau fractures. No acute osseous abnormality. Electronically Signed   By: Jasmine Pang M.D.   On: 12/09/2022 21:32   DG Elbow 2 Views Right  Result Date: 12/09/2022 CLINICAL DATA:  Fall EXAM: RIGHT ELBOW - 2 VIEW COMPARISON:  None Available. FINDINGS: There is no evidence of fracture, dislocation, or joint effusion. There is no evidence of arthropathy or other focal bone abnormality. Soft tissues are unremarkable. IMPRESSION: Negative. Electronically Signed   By: Darliss Cheney M.D.   On: 12/09/2022 21:30    Scheduled Meds:    amLODipine  10 mg Oral Daily   docusate sodium  100 mg Oral BID   donepezil  10 mg Oral QHS   enoxaparin (LOVENOX) injection  30 mg Subcutaneous Q24H   hydrALAZINE  25 mg Oral Q8H    irbesartan  37.5 mg Oral Daily   levothyroxine  150 mcg Oral Daily   memantine  10 mg Oral Daily   multivitamin  1 tablet Oral Q breakfast   multivitamin with minerals  1 tablet Oral Q breakfast   pantoprazole  40 mg Oral Daily   QUEtiapine  50 mg Oral BID   sertraline  100 mg Oral Daily    Continuous Infusions:       LOS: 1 day     Marcellus Scott, MD,  FACP, FHM, SFHM, CMPC, CHCQM-PHYADV  [facility] [place]     To contact the attending provider between 7A-7P or the covering provider during after hours 7P-7A, please log into the web site [email or web address] and access using universal [place] password for that web site. If you do not have the password, please call the hospital operator.  12/11/2022, 8:12 AM

## 2022-12-12 LAB — T3, FREE: T3, Free: 1.7 pg/mL — ABNORMAL LOW (ref 2.0–4.4)

## 2022-12-14 DIAGNOSIS — R41841 Cognitive communication deficit: Secondary | ICD-10-CM | POA: Diagnosis not present

## 2022-12-14 DIAGNOSIS — F5101 Primary insomnia: Secondary | ICD-10-CM | POA: Diagnosis not present

## 2022-12-14 DIAGNOSIS — F03C2 Unspecified dementia, severe, with psychotic disturbance: Secondary | ICD-10-CM | POA: Diagnosis not present

## 2022-12-14 DIAGNOSIS — F0390 Unspecified dementia without behavioral disturbance: Secondary | ICD-10-CM | POA: Diagnosis not present

## 2022-12-14 DIAGNOSIS — M6281 Muscle weakness (generalized): Secondary | ICD-10-CM | POA: Diagnosis not present

## 2022-12-14 DIAGNOSIS — M6250 Muscle wasting and atrophy, not elsewhere classified, unspecified site: Secondary | ICD-10-CM | POA: Diagnosis not present

## 2022-12-14 DIAGNOSIS — R2681 Unsteadiness on feet: Secondary | ICD-10-CM | POA: Diagnosis not present

## 2022-12-14 DIAGNOSIS — Z9181 History of falling: Secondary | ICD-10-CM | POA: Diagnosis not present

## 2022-12-15 DIAGNOSIS — I10 Essential (primary) hypertension: Secondary | ICD-10-CM | POA: Diagnosis not present

## 2022-12-15 DIAGNOSIS — G3183 Dementia with Lewy bodies: Secondary | ICD-10-CM | POA: Diagnosis not present

## 2022-12-15 DIAGNOSIS — E039 Hypothyroidism, unspecified: Secondary | ICD-10-CM | POA: Diagnosis not present

## 2022-12-15 DIAGNOSIS — F02A0 Dementia in other diseases classified elsewhere, mild, without behavioral disturbance, psychotic disturbance, mood disturbance, and anxiety: Secondary | ICD-10-CM | POA: Diagnosis not present

## 2022-12-15 DIAGNOSIS — S0181XD Laceration without foreign body of other part of head, subsequent encounter: Secondary | ICD-10-CM | POA: Diagnosis not present

## 2022-12-16 DIAGNOSIS — R41841 Cognitive communication deficit: Secondary | ICD-10-CM | POA: Diagnosis not present

## 2022-12-16 DIAGNOSIS — F0390 Unspecified dementia without behavioral disturbance: Secondary | ICD-10-CM | POA: Diagnosis not present

## 2022-12-16 DIAGNOSIS — Z87448 Personal history of other diseases of urinary system: Secondary | ICD-10-CM | POA: Diagnosis not present

## 2022-12-16 DIAGNOSIS — S0181XA Laceration without foreign body of other part of head, initial encounter: Secondary | ICD-10-CM | POA: Diagnosis not present

## 2022-12-16 DIAGNOSIS — R262 Difficulty in walking, not elsewhere classified: Secondary | ICD-10-CM | POA: Diagnosis not present

## 2022-12-16 DIAGNOSIS — N183 Chronic kidney disease, stage 3 unspecified: Secondary | ICD-10-CM | POA: Diagnosis not present

## 2022-12-16 DIAGNOSIS — M6250 Muscle wasting and atrophy, not elsewhere classified, unspecified site: Secondary | ICD-10-CM | POA: Diagnosis not present

## 2022-12-16 DIAGNOSIS — F39 Unspecified mood [affective] disorder: Secondary | ICD-10-CM | POA: Diagnosis not present

## 2022-12-16 DIAGNOSIS — E871 Hypo-osmolality and hyponatremia: Secondary | ICD-10-CM | POA: Diagnosis not present

## 2022-12-16 DIAGNOSIS — M6281 Muscle weakness (generalized): Secondary | ICD-10-CM | POA: Diagnosis not present

## 2022-12-16 DIAGNOSIS — F02C3 Dementia in other diseases classified elsewhere, severe, with mood disturbance: Secondary | ICD-10-CM | POA: Diagnosis not present

## 2022-12-16 DIAGNOSIS — E039 Hypothyroidism, unspecified: Secondary | ICD-10-CM | POA: Diagnosis not present

## 2022-12-16 DIAGNOSIS — R2681 Unsteadiness on feet: Secondary | ICD-10-CM | POA: Diagnosis not present

## 2022-12-16 DIAGNOSIS — I129 Hypertensive chronic kidney disease with stage 1 through stage 4 chronic kidney disease, or unspecified chronic kidney disease: Secondary | ICD-10-CM | POA: Diagnosis not present

## 2022-12-16 DIAGNOSIS — G3183 Dementia with Lewy bodies: Secondary | ICD-10-CM | POA: Diagnosis not present

## 2022-12-16 DIAGNOSIS — Z9181 History of falling: Secondary | ICD-10-CM | POA: Diagnosis not present

## 2022-12-16 DIAGNOSIS — D631 Anemia in chronic kidney disease: Secondary | ICD-10-CM | POA: Diagnosis not present

## 2022-12-16 DIAGNOSIS — R5381 Other malaise: Secondary | ICD-10-CM | POA: Diagnosis not present

## 2022-12-16 DIAGNOSIS — M6259 Muscle wasting and atrophy, not elsewhere classified, multiple sites: Secondary | ICD-10-CM | POA: Diagnosis not present

## 2022-12-17 ENCOUNTER — Other Ambulatory Visit: Payer: Self-pay | Admitting: *Deleted

## 2022-12-17 NOTE — Patient Outreach (Signed)
Mrs. [name] resides in [facility] [place] skilled nursing facility. Mrs. [name] recently admitted under the [facility] ACO 3-day SNF waiver. Screening for potential [facility] care coordination services as a benefit of health plan and primary care provider.  Facility site visit to [facility] skilled nursing facility. Met with therapy manager and social work team. Mrs. [name] is from home alone. Has confusion. Son is primary contact. Will likely need long term care.   Will continue to follow for barriers and progression.  Raiford Noble, MSN, RN,BSN [facility] Post Acute Care Coordinator [phone] (Direct dial)

## 2022-12-21 DIAGNOSIS — R41841 Cognitive communication deficit: Secondary | ICD-10-CM | POA: Diagnosis not present

## 2022-12-21 DIAGNOSIS — R2681 Unsteadiness on feet: Secondary | ICD-10-CM | POA: Diagnosis not present

## 2022-12-21 DIAGNOSIS — M6281 Muscle weakness (generalized): Secondary | ICD-10-CM | POA: Diagnosis not present

## 2022-12-21 DIAGNOSIS — F0390 Unspecified dementia without behavioral disturbance: Secondary | ICD-10-CM | POA: Diagnosis not present

## 2022-12-21 DIAGNOSIS — Z9181 History of falling: Secondary | ICD-10-CM | POA: Diagnosis not present

## 2022-12-21 DIAGNOSIS — M6250 Muscle wasting and atrophy, not elsewhere classified, unspecified site: Secondary | ICD-10-CM | POA: Diagnosis not present

## 2022-12-23 DIAGNOSIS — F0390 Unspecified dementia without behavioral disturbance: Secondary | ICD-10-CM | POA: Diagnosis not present

## 2022-12-23 DIAGNOSIS — R41841 Cognitive communication deficit: Secondary | ICD-10-CM | POA: Diagnosis not present

## 2022-12-23 DIAGNOSIS — M6281 Muscle weakness (generalized): Secondary | ICD-10-CM | POA: Diagnosis not present

## 2022-12-23 DIAGNOSIS — M6250 Muscle wasting and atrophy, not elsewhere classified, unspecified site: Secondary | ICD-10-CM | POA: Diagnosis not present

## 2022-12-23 DIAGNOSIS — Z9181 History of falling: Secondary | ICD-10-CM | POA: Diagnosis not present

## 2022-12-23 DIAGNOSIS — R2681 Unsteadiness on feet: Secondary | ICD-10-CM | POA: Diagnosis not present

## 2022-12-23 DIAGNOSIS — R609 Edema, unspecified: Secondary | ICD-10-CM | POA: Diagnosis not present

## 2022-12-28 DIAGNOSIS — F0390 Unspecified dementia without behavioral disturbance: Secondary | ICD-10-CM | POA: Diagnosis not present

## 2022-12-28 DIAGNOSIS — R609 Edema, unspecified: Secondary | ICD-10-CM | POA: Diagnosis not present

## 2022-12-28 DIAGNOSIS — M6250 Muscle wasting and atrophy, not elsewhere classified, unspecified site: Secondary | ICD-10-CM | POA: Diagnosis not present

## 2022-12-28 DIAGNOSIS — M6281 Muscle weakness (generalized): Secondary | ICD-10-CM | POA: Diagnosis not present

## 2022-12-28 DIAGNOSIS — R2681 Unsteadiness on feet: Secondary | ICD-10-CM | POA: Diagnosis not present

## 2022-12-28 DIAGNOSIS — R41841 Cognitive communication deficit: Secondary | ICD-10-CM | POA: Diagnosis not present

## 2022-12-28 DIAGNOSIS — Z9181 History of falling: Secondary | ICD-10-CM | POA: Diagnosis not present

## 2022-12-30 DIAGNOSIS — Z9181 History of falling: Secondary | ICD-10-CM | POA: Diagnosis not present

## 2022-12-30 DIAGNOSIS — T148XXA Other injury of unspecified body region, initial encounter: Secondary | ICD-10-CM | POA: Diagnosis not present

## 2022-12-30 DIAGNOSIS — N183 Chronic kidney disease, stage 3 unspecified: Secondary | ICD-10-CM | POA: Diagnosis not present

## 2022-12-30 DIAGNOSIS — E039 Hypothyroidism, unspecified: Secondary | ICD-10-CM | POA: Diagnosis not present

## 2022-12-30 DIAGNOSIS — F32A Depression, unspecified: Secondary | ICD-10-CM | POA: Diagnosis not present

## 2022-12-30 DIAGNOSIS — M6250 Muscle wasting and atrophy, not elsewhere classified, unspecified site: Secondary | ICD-10-CM | POA: Diagnosis not present

## 2022-12-30 DIAGNOSIS — R41841 Cognitive communication deficit: Secondary | ICD-10-CM | POA: Diagnosis not present

## 2022-12-30 DIAGNOSIS — Z7189 Other specified counseling: Secondary | ICD-10-CM | POA: Diagnosis not present

## 2022-12-30 DIAGNOSIS — M6281 Muscle weakness (generalized): Secondary | ICD-10-CM | POA: Diagnosis not present

## 2022-12-30 DIAGNOSIS — I129 Hypertensive chronic kidney disease with stage 1 through stage 4 chronic kidney disease, or unspecified chronic kidney disease: Secondary | ICD-10-CM | POA: Diagnosis not present

## 2022-12-30 DIAGNOSIS — R2681 Unsteadiness on feet: Secondary | ICD-10-CM | POA: Diagnosis not present

## 2022-12-30 DIAGNOSIS — F0390 Unspecified dementia without behavioral disturbance: Secondary | ICD-10-CM | POA: Diagnosis not present

## 2022-12-30 DIAGNOSIS — L0390 Cellulitis, unspecified: Secondary | ICD-10-CM | POA: Diagnosis not present

## 2022-12-30 DIAGNOSIS — Z8679 Personal history of other diseases of the circulatory system: Secondary | ICD-10-CM | POA: Diagnosis not present

## 2022-12-30 DIAGNOSIS — E871 Hypo-osmolality and hyponatremia: Secondary | ICD-10-CM | POA: Diagnosis not present

## 2022-12-30 DIAGNOSIS — R609 Edema, unspecified: Secondary | ICD-10-CM | POA: Diagnosis not present

## 2023-01-05 DIAGNOSIS — F02A0 Dementia in other diseases classified elsewhere, mild, without behavioral disturbance, psychotic disturbance, mood disturbance, and anxiety: Secondary | ICD-10-CM | POA: Diagnosis not present

## 2023-01-05 DIAGNOSIS — E039 Hypothyroidism, unspecified: Secondary | ICD-10-CM | POA: Diagnosis not present

## 2023-01-05 DIAGNOSIS — R262 Difficulty in walking, not elsewhere classified: Secondary | ICD-10-CM | POA: Diagnosis not present

## 2023-01-05 DIAGNOSIS — I10 Essential (primary) hypertension: Secondary | ICD-10-CM | POA: Diagnosis not present

## 2023-01-05 DIAGNOSIS — G3183 Dementia with Lewy bodies: Secondary | ICD-10-CM | POA: Diagnosis not present

## 2023-01-06 DIAGNOSIS — R609 Edema, unspecified: Secondary | ICD-10-CM | POA: Diagnosis not present

## 2023-01-06 DIAGNOSIS — M6250 Muscle wasting and atrophy, not elsewhere classified, unspecified site: Secondary | ICD-10-CM | POA: Diagnosis not present

## 2023-01-06 DIAGNOSIS — Z9181 History of falling: Secondary | ICD-10-CM | POA: Diagnosis not present

## 2023-01-06 DIAGNOSIS — R2681 Unsteadiness on feet: Secondary | ICD-10-CM | POA: Diagnosis not present

## 2023-01-06 DIAGNOSIS — F0390 Unspecified dementia without behavioral disturbance: Secondary | ICD-10-CM | POA: Diagnosis not present

## 2023-01-06 DIAGNOSIS — R41841 Cognitive communication deficit: Secondary | ICD-10-CM | POA: Diagnosis not present

## 2023-01-06 DIAGNOSIS — M6281 Muscle weakness (generalized): Secondary | ICD-10-CM | POA: Diagnosis not present

## 2023-01-11 DIAGNOSIS — Z9181 History of falling: Secondary | ICD-10-CM | POA: Diagnosis not present

## 2023-01-11 DIAGNOSIS — M6281 Muscle weakness (generalized): Secondary | ICD-10-CM | POA: Diagnosis not present

## 2023-01-11 DIAGNOSIS — W19XXXA Unspecified fall, initial encounter: Secondary | ICD-10-CM | POA: Diagnosis not present

## 2023-01-11 DIAGNOSIS — M6250 Muscle wasting and atrophy, not elsewhere classified, unspecified site: Secondary | ICD-10-CM | POA: Diagnosis not present

## 2023-01-11 DIAGNOSIS — F03B3 Unspecified dementia, moderate, with mood disturbance: Secondary | ICD-10-CM | POA: Diagnosis not present

## 2023-01-11 DIAGNOSIS — R41841 Cognitive communication deficit: Secondary | ICD-10-CM | POA: Diagnosis not present

## 2023-01-11 DIAGNOSIS — F32A Depression, unspecified: Secondary | ICD-10-CM | POA: Diagnosis not present

## 2023-01-11 DIAGNOSIS — Z8719 Personal history of other diseases of the digestive system: Secondary | ICD-10-CM | POA: Diagnosis not present

## 2023-01-11 DIAGNOSIS — R609 Edema, unspecified: Secondary | ICD-10-CM | POA: Diagnosis not present

## 2023-01-11 DIAGNOSIS — R2681 Unsteadiness on feet: Secondary | ICD-10-CM | POA: Diagnosis not present

## 2023-01-11 DIAGNOSIS — N200 Calculus of kidney: Secondary | ICD-10-CM | POA: Diagnosis not present

## 2023-01-11 DIAGNOSIS — F0390 Unspecified dementia without behavioral disturbance: Secondary | ICD-10-CM | POA: Diagnosis not present

## 2023-01-11 DIAGNOSIS — I10 Essential (primary) hypertension: Secondary | ICD-10-CM | POA: Diagnosis not present

## 2023-01-13 DIAGNOSIS — Z9181 History of falling: Secondary | ICD-10-CM | POA: Diagnosis not present

## 2023-01-13 DIAGNOSIS — M6250 Muscle wasting and atrophy, not elsewhere classified, unspecified site: Secondary | ICD-10-CM | POA: Diagnosis not present

## 2023-01-13 DIAGNOSIS — R2681 Unsteadiness on feet: Secondary | ICD-10-CM | POA: Diagnosis not present

## 2023-01-13 DIAGNOSIS — M6281 Muscle weakness (generalized): Secondary | ICD-10-CM | POA: Diagnosis not present

## 2023-01-13 DIAGNOSIS — F0390 Unspecified dementia without behavioral disturbance: Secondary | ICD-10-CM | POA: Diagnosis not present

## 2023-01-13 DIAGNOSIS — R609 Edema, unspecified: Secondary | ICD-10-CM | POA: Diagnosis not present

## 2023-01-13 DIAGNOSIS — R41841 Cognitive communication deficit: Secondary | ICD-10-CM | POA: Diagnosis not present

## 2023-01-18 DIAGNOSIS — F0390 Unspecified dementia without behavioral disturbance: Secondary | ICD-10-CM | POA: Diagnosis not present

## 2023-01-18 DIAGNOSIS — R41841 Cognitive communication deficit: Secondary | ICD-10-CM | POA: Diagnosis not present

## 2023-01-18 DIAGNOSIS — R2681 Unsteadiness on feet: Secondary | ICD-10-CM | POA: Diagnosis not present

## 2023-01-18 DIAGNOSIS — R609 Edema, unspecified: Secondary | ICD-10-CM | POA: Diagnosis not present

## 2023-01-18 DIAGNOSIS — M6250 Muscle wasting and atrophy, not elsewhere classified, unspecified site: Secondary | ICD-10-CM | POA: Diagnosis not present

## 2023-01-18 DIAGNOSIS — Z9181 History of falling: Secondary | ICD-10-CM | POA: Diagnosis not present

## 2023-01-18 DIAGNOSIS — M6281 Muscle weakness (generalized): Secondary | ICD-10-CM | POA: Diagnosis not present

## 2023-01-20 ENCOUNTER — Other Ambulatory Visit: Payer: Self-pay | Admitting: *Deleted

## 2023-01-20 DIAGNOSIS — M6281 Muscle weakness (generalized): Secondary | ICD-10-CM | POA: Diagnosis not present

## 2023-01-20 DIAGNOSIS — R2681 Unsteadiness on feet: Secondary | ICD-10-CM | POA: Diagnosis not present

## 2023-01-20 DIAGNOSIS — Z9181 History of falling: Secondary | ICD-10-CM | POA: Diagnosis not present

## 2023-01-20 DIAGNOSIS — R41841 Cognitive communication deficit: Secondary | ICD-10-CM | POA: Diagnosis not present

## 2023-01-20 DIAGNOSIS — M6250 Muscle wasting and atrophy, not elsewhere classified, unspecified site: Secondary | ICD-10-CM | POA: Diagnosis not present

## 2023-01-20 DIAGNOSIS — F0390 Unspecified dementia without behavioral disturbance: Secondary | ICD-10-CM | POA: Diagnosis not present

## 2023-01-20 DIAGNOSIS — R609 Edema, unspecified: Secondary | ICD-10-CM | POA: Diagnosis not present

## 2023-01-20 DIAGNOSIS — R1312 Dysphagia, oropharyngeal phase: Secondary | ICD-10-CM | POA: Diagnosis not present

## 2023-01-20 NOTE — Patient Outreach (Signed)
[facility] Post- Acute Care Coordinator  follow up. Mrs. Dreese resides in [facility] skilled nursing facility.  Screening for potential [facility] care coordination services as a benefit of health plan and primary care provider.  Update received from [place], Production assistant, radio. Mrs. Dimaano will transition to LTC on Friday, June 14th.   No identifiable [facility] care coordination needs.   Raiford Noble, MSN, RN,BSN [facility] Post Acute Care Coordinator [phone] (Direct dial)

## 2023-01-26 DIAGNOSIS — R1312 Dysphagia, oropharyngeal phase: Secondary | ICD-10-CM | POA: Diagnosis not present

## 2023-01-26 DIAGNOSIS — R41841 Cognitive communication deficit: Secondary | ICD-10-CM | POA: Diagnosis not present

## 2023-01-28 DIAGNOSIS — N183 Chronic kidney disease, stage 3 unspecified: Secondary | ICD-10-CM | POA: Diagnosis not present

## 2023-01-28 DIAGNOSIS — L304 Erythema intertrigo: Secondary | ICD-10-CM | POA: Diagnosis not present

## 2023-01-28 DIAGNOSIS — D509 Iron deficiency anemia, unspecified: Secondary | ICD-10-CM | POA: Diagnosis not present

## 2023-01-28 DIAGNOSIS — Z6828 Body mass index (BMI) 28.0-28.9, adult: Secondary | ICD-10-CM | POA: Diagnosis not present

## 2023-01-28 DIAGNOSIS — R634 Abnormal weight loss: Secondary | ICD-10-CM | POA: Diagnosis not present

## 2023-01-28 DIAGNOSIS — F32A Depression, unspecified: Secondary | ICD-10-CM | POA: Diagnosis not present

## 2023-01-28 DIAGNOSIS — Z8639 Personal history of other endocrine, nutritional and metabolic disease: Secondary | ICD-10-CM | POA: Diagnosis not present

## 2023-01-28 DIAGNOSIS — E039 Hypothyroidism, unspecified: Secondary | ICD-10-CM | POA: Diagnosis not present

## 2023-01-29 DIAGNOSIS — R1312 Dysphagia, oropharyngeal phase: Secondary | ICD-10-CM | POA: Diagnosis not present

## 2023-01-29 DIAGNOSIS — R41841 Cognitive communication deficit: Secondary | ICD-10-CM | POA: Diagnosis not present

## 2023-01-31 ENCOUNTER — Other Ambulatory Visit: Payer: Self-pay | Admitting: Neurology

## 2023-02-01 DIAGNOSIS — R41841 Cognitive communication deficit: Secondary | ICD-10-CM | POA: Diagnosis not present

## 2023-02-01 DIAGNOSIS — R1312 Dysphagia, oropharyngeal phase: Secondary | ICD-10-CM | POA: Diagnosis not present

## 2023-02-02 DIAGNOSIS — R41841 Cognitive communication deficit: Secondary | ICD-10-CM | POA: Diagnosis not present

## 2023-02-02 DIAGNOSIS — R1312 Dysphagia, oropharyngeal phase: Secondary | ICD-10-CM | POA: Diagnosis not present

## 2023-02-03 DIAGNOSIS — E519 Thiamine deficiency, unspecified: Secondary | ICD-10-CM | POA: Diagnosis not present

## 2023-02-03 DIAGNOSIS — R1312 Dysphagia, oropharyngeal phase: Secondary | ICD-10-CM | POA: Diagnosis not present

## 2023-02-03 DIAGNOSIS — D649 Anemia, unspecified: Secondary | ICD-10-CM | POA: Diagnosis not present

## 2023-02-03 DIAGNOSIS — F39 Unspecified mood [affective] disorder: Secondary | ICD-10-CM | POA: Diagnosis not present

## 2023-02-03 DIAGNOSIS — R262 Difficulty in walking, not elsewhere classified: Secondary | ICD-10-CM | POA: Diagnosis not present

## 2023-02-03 DIAGNOSIS — N183 Chronic kidney disease, stage 3 unspecified: Secondary | ICD-10-CM | POA: Diagnosis not present

## 2023-02-03 DIAGNOSIS — R41841 Cognitive communication deficit: Secondary | ICD-10-CM | POA: Diagnosis not present

## 2023-02-03 DIAGNOSIS — R5381 Other malaise: Secondary | ICD-10-CM | POA: Diagnosis not present

## 2023-02-03 DIAGNOSIS — F02C3 Dementia in other diseases classified elsewhere, severe, with mood disturbance: Secondary | ICD-10-CM | POA: Diagnosis not present

## 2023-02-03 DIAGNOSIS — E039 Hypothyroidism, unspecified: Secondary | ICD-10-CM | POA: Diagnosis not present

## 2023-02-03 DIAGNOSIS — G3183 Dementia with Lewy bodies: Secondary | ICD-10-CM | POA: Diagnosis not present

## 2023-02-03 DIAGNOSIS — F02C4 Dementia in other diseases classified elsewhere, severe, with anxiety: Secondary | ICD-10-CM | POA: Diagnosis not present

## 2023-02-04 DIAGNOSIS — R1312 Dysphagia, oropharyngeal phase: Secondary | ICD-10-CM | POA: Diagnosis not present

## 2023-02-04 DIAGNOSIS — R41841 Cognitive communication deficit: Secondary | ICD-10-CM | POA: Diagnosis not present

## 2023-02-05 DIAGNOSIS — R41841 Cognitive communication deficit: Secondary | ICD-10-CM | POA: Diagnosis not present

## 2023-02-05 DIAGNOSIS — R1312 Dysphagia, oropharyngeal phase: Secondary | ICD-10-CM | POA: Diagnosis not present

## 2023-02-08 DIAGNOSIS — M6281 Muscle weakness (generalized): Secondary | ICD-10-CM | POA: Diagnosis not present

## 2023-02-08 DIAGNOSIS — R41841 Cognitive communication deficit: Secondary | ICD-10-CM | POA: Diagnosis not present

## 2023-02-08 DIAGNOSIS — R1312 Dysphagia, oropharyngeal phase: Secondary | ICD-10-CM | POA: Diagnosis not present

## 2023-02-08 DIAGNOSIS — M6259 Muscle wasting and atrophy, not elsewhere classified, multiple sites: Secondary | ICD-10-CM | POA: Diagnosis not present

## 2023-02-09 DIAGNOSIS — M6259 Muscle wasting and atrophy, not elsewhere classified, multiple sites: Secondary | ICD-10-CM | POA: Diagnosis not present

## 2023-02-09 DIAGNOSIS — R1312 Dysphagia, oropharyngeal phase: Secondary | ICD-10-CM | POA: Diagnosis not present

## 2023-02-09 DIAGNOSIS — R41841 Cognitive communication deficit: Secondary | ICD-10-CM | POA: Diagnosis not present

## 2023-02-09 DIAGNOSIS — M6281 Muscle weakness (generalized): Secondary | ICD-10-CM | POA: Diagnosis not present

## 2023-02-10 DIAGNOSIS — M6259 Muscle wasting and atrophy, not elsewhere classified, multiple sites: Secondary | ICD-10-CM | POA: Diagnosis not present

## 2023-02-10 DIAGNOSIS — R1312 Dysphagia, oropharyngeal phase: Secondary | ICD-10-CM | POA: Diagnosis not present

## 2023-02-10 DIAGNOSIS — M6281 Muscle weakness (generalized): Secondary | ICD-10-CM | POA: Diagnosis not present

## 2023-02-10 DIAGNOSIS — R41841 Cognitive communication deficit: Secondary | ICD-10-CM | POA: Diagnosis not present

## 2023-02-11 DIAGNOSIS — M6259 Muscle wasting and atrophy, not elsewhere classified, multiple sites: Secondary | ICD-10-CM | POA: Diagnosis not present

## 2023-02-11 DIAGNOSIS — M6281 Muscle weakness (generalized): Secondary | ICD-10-CM | POA: Diagnosis not present

## 2023-02-11 DIAGNOSIS — R41841 Cognitive communication deficit: Secondary | ICD-10-CM | POA: Diagnosis not present

## 2023-02-11 DIAGNOSIS — R1312 Dysphagia, oropharyngeal phase: Secondary | ICD-10-CM | POA: Diagnosis not present

## 2023-02-15 DIAGNOSIS — F5101 Primary insomnia: Secondary | ICD-10-CM | POA: Diagnosis not present

## 2023-02-15 DIAGNOSIS — F03C2 Unspecified dementia, severe, with psychotic disturbance: Secondary | ICD-10-CM | POA: Diagnosis not present

## 2023-02-16 DIAGNOSIS — F02C3 Dementia in other diseases classified elsewhere, severe, with mood disturbance: Secondary | ICD-10-CM | POA: Diagnosis not present

## 2023-02-16 DIAGNOSIS — M6281 Muscle weakness (generalized): Secondary | ICD-10-CM | POA: Diagnosis not present

## 2023-02-16 DIAGNOSIS — R1312 Dysphagia, oropharyngeal phase: Secondary | ICD-10-CM | POA: Diagnosis not present

## 2023-02-16 DIAGNOSIS — M6259 Muscle wasting and atrophy, not elsewhere classified, multiple sites: Secondary | ICD-10-CM | POA: Diagnosis not present

## 2023-02-16 DIAGNOSIS — R441 Visual hallucinations: Secondary | ICD-10-CM | POA: Diagnosis not present

## 2023-02-16 DIAGNOSIS — E039 Hypothyroidism, unspecified: Secondary | ICD-10-CM | POA: Diagnosis not present

## 2023-02-16 DIAGNOSIS — R41841 Cognitive communication deficit: Secondary | ICD-10-CM | POA: Diagnosis not present

## 2023-02-16 DIAGNOSIS — I10 Essential (primary) hypertension: Secondary | ICD-10-CM | POA: Diagnosis not present

## 2023-02-18 DIAGNOSIS — M6259 Muscle wasting and atrophy, not elsewhere classified, multiple sites: Secondary | ICD-10-CM | POA: Diagnosis not present

## 2023-02-18 DIAGNOSIS — M6281 Muscle weakness (generalized): Secondary | ICD-10-CM | POA: Diagnosis not present

## 2023-02-18 DIAGNOSIS — R41841 Cognitive communication deficit: Secondary | ICD-10-CM | POA: Diagnosis not present

## 2023-02-18 DIAGNOSIS — R1312 Dysphagia, oropharyngeal phase: Secondary | ICD-10-CM | POA: Diagnosis not present

## 2023-02-22 DIAGNOSIS — R1312 Dysphagia, oropharyngeal phase: Secondary | ICD-10-CM | POA: Diagnosis not present

## 2023-02-22 DIAGNOSIS — M6259 Muscle wasting and atrophy, not elsewhere classified, multiple sites: Secondary | ICD-10-CM | POA: Diagnosis not present

## 2023-02-22 DIAGNOSIS — R41841 Cognitive communication deficit: Secondary | ICD-10-CM | POA: Diagnosis not present

## 2023-02-22 DIAGNOSIS — M6281 Muscle weakness (generalized): Secondary | ICD-10-CM | POA: Diagnosis not present

## 2023-02-23 DIAGNOSIS — R1312 Dysphagia, oropharyngeal phase: Secondary | ICD-10-CM | POA: Diagnosis not present

## 2023-02-23 DIAGNOSIS — R41841 Cognitive communication deficit: Secondary | ICD-10-CM | POA: Diagnosis not present

## 2023-02-23 DIAGNOSIS — M6259 Muscle wasting and atrophy, not elsewhere classified, multiple sites: Secondary | ICD-10-CM | POA: Diagnosis not present

## 2023-02-23 DIAGNOSIS — M6281 Muscle weakness (generalized): Secondary | ICD-10-CM | POA: Diagnosis not present

## 2023-02-25 DIAGNOSIS — M6281 Muscle weakness (generalized): Secondary | ICD-10-CM | POA: Diagnosis not present

## 2023-02-25 DIAGNOSIS — R1312 Dysphagia, oropharyngeal phase: Secondary | ICD-10-CM | POA: Diagnosis not present

## 2023-02-25 DIAGNOSIS — M6259 Muscle wasting and atrophy, not elsewhere classified, multiple sites: Secondary | ICD-10-CM | POA: Diagnosis not present

## 2023-02-25 DIAGNOSIS — R41841 Cognitive communication deficit: Secondary | ICD-10-CM | POA: Diagnosis not present

## 2023-02-26 DIAGNOSIS — M6259 Muscle wasting and atrophy, not elsewhere classified, multiple sites: Secondary | ICD-10-CM | POA: Diagnosis not present

## 2023-02-26 DIAGNOSIS — M6281 Muscle weakness (generalized): Secondary | ICD-10-CM | POA: Diagnosis not present

## 2023-02-26 DIAGNOSIS — R41841 Cognitive communication deficit: Secondary | ICD-10-CM | POA: Diagnosis not present

## 2023-02-26 DIAGNOSIS — R1312 Dysphagia, oropharyngeal phase: Secondary | ICD-10-CM | POA: Diagnosis not present

## 2023-03-03 DIAGNOSIS — M6259 Muscle wasting and atrophy, not elsewhere classified, multiple sites: Secondary | ICD-10-CM | POA: Diagnosis not present

## 2023-03-03 DIAGNOSIS — R41841 Cognitive communication deficit: Secondary | ICD-10-CM | POA: Diagnosis not present

## 2023-03-03 DIAGNOSIS — R1312 Dysphagia, oropharyngeal phase: Secondary | ICD-10-CM | POA: Diagnosis not present

## 2023-03-03 DIAGNOSIS — M6281 Muscle weakness (generalized): Secondary | ICD-10-CM | POA: Diagnosis not present

## 2023-03-04 DIAGNOSIS — R41841 Cognitive communication deficit: Secondary | ICD-10-CM | POA: Diagnosis not present

## 2023-03-04 DIAGNOSIS — M6281 Muscle weakness (generalized): Secondary | ICD-10-CM | POA: Diagnosis not present

## 2023-03-04 DIAGNOSIS — R1312 Dysphagia, oropharyngeal phase: Secondary | ICD-10-CM | POA: Diagnosis not present

## 2023-03-04 DIAGNOSIS — M6259 Muscle wasting and atrophy, not elsewhere classified, multiple sites: Secondary | ICD-10-CM | POA: Diagnosis not present

## 2023-03-08 DIAGNOSIS — M6259 Muscle wasting and atrophy, not elsewhere classified, multiple sites: Secondary | ICD-10-CM | POA: Diagnosis not present

## 2023-03-08 DIAGNOSIS — R41841 Cognitive communication deficit: Secondary | ICD-10-CM | POA: Diagnosis not present

## 2023-03-08 DIAGNOSIS — M6281 Muscle weakness (generalized): Secondary | ICD-10-CM | POA: Diagnosis not present

## 2023-03-08 DIAGNOSIS — R1312 Dysphagia, oropharyngeal phase: Secondary | ICD-10-CM | POA: Diagnosis not present

## 2023-03-09 DIAGNOSIS — R41841 Cognitive communication deficit: Secondary | ICD-10-CM | POA: Diagnosis not present

## 2023-03-09 DIAGNOSIS — M6259 Muscle wasting and atrophy, not elsewhere classified, multiple sites: Secondary | ICD-10-CM | POA: Diagnosis not present

## 2023-03-09 DIAGNOSIS — R1312 Dysphagia, oropharyngeal phase: Secondary | ICD-10-CM | POA: Diagnosis not present

## 2023-03-09 DIAGNOSIS — M6281 Muscle weakness (generalized): Secondary | ICD-10-CM | POA: Diagnosis not present

## 2023-03-10 DIAGNOSIS — R41841 Cognitive communication deficit: Secondary | ICD-10-CM | POA: Diagnosis not present

## 2023-03-10 DIAGNOSIS — M6281 Muscle weakness (generalized): Secondary | ICD-10-CM | POA: Diagnosis not present

## 2023-03-10 DIAGNOSIS — M6259 Muscle wasting and atrophy, not elsewhere classified, multiple sites: Secondary | ICD-10-CM | POA: Diagnosis not present

## 2023-03-10 DIAGNOSIS — R1312 Dysphagia, oropharyngeal phase: Secondary | ICD-10-CM | POA: Diagnosis not present

## 2023-03-11 DIAGNOSIS — M6281 Muscle weakness (generalized): Secondary | ICD-10-CM | POA: Diagnosis not present

## 2023-03-11 DIAGNOSIS — R41841 Cognitive communication deficit: Secondary | ICD-10-CM | POA: Diagnosis not present

## 2023-03-11 DIAGNOSIS — F32A Depression, unspecified: Secondary | ICD-10-CM | POA: Diagnosis not present

## 2023-03-11 DIAGNOSIS — F02C3 Dementia in other diseases classified elsewhere, severe, with mood disturbance: Secondary | ICD-10-CM | POA: Diagnosis not present

## 2023-03-11 DIAGNOSIS — G3183 Dementia with Lewy bodies: Secondary | ICD-10-CM | POA: Diagnosis not present

## 2023-03-11 DIAGNOSIS — Z6825 Body mass index (BMI) 25.0-25.9, adult: Secondary | ICD-10-CM | POA: Diagnosis not present

## 2023-03-11 DIAGNOSIS — E039 Hypothyroidism, unspecified: Secondary | ICD-10-CM | POA: Diagnosis not present

## 2023-03-11 DIAGNOSIS — F02C4 Dementia in other diseases classified elsewhere, severe, with anxiety: Secondary | ICD-10-CM | POA: Diagnosis not present

## 2023-03-11 DIAGNOSIS — I129 Hypertensive chronic kidney disease with stage 1 through stage 4 chronic kidney disease, or unspecified chronic kidney disease: Secondary | ICD-10-CM | POA: Diagnosis not present

## 2023-03-11 DIAGNOSIS — R1312 Dysphagia, oropharyngeal phase: Secondary | ICD-10-CM | POA: Diagnosis not present

## 2023-03-11 DIAGNOSIS — M6259 Muscle wasting and atrophy, not elsewhere classified, multiple sites: Secondary | ICD-10-CM | POA: Diagnosis not present

## 2023-03-11 DIAGNOSIS — R634 Abnormal weight loss: Secondary | ICD-10-CM | POA: Diagnosis not present

## 2023-03-11 DIAGNOSIS — N183 Chronic kidney disease, stage 3 unspecified: Secondary | ICD-10-CM | POA: Diagnosis not present

## 2023-03-12 DIAGNOSIS — M6259 Muscle wasting and atrophy, not elsewhere classified, multiple sites: Secondary | ICD-10-CM | POA: Diagnosis not present

## 2023-03-12 DIAGNOSIS — R1312 Dysphagia, oropharyngeal phase: Secondary | ICD-10-CM | POA: Diagnosis not present

## 2023-03-12 DIAGNOSIS — M6281 Muscle weakness (generalized): Secondary | ICD-10-CM | POA: Diagnosis not present

## 2023-03-12 DIAGNOSIS — R41841 Cognitive communication deficit: Secondary | ICD-10-CM | POA: Diagnosis not present

## 2023-03-15 DIAGNOSIS — R1312 Dysphagia, oropharyngeal phase: Secondary | ICD-10-CM | POA: Diagnosis not present

## 2023-03-15 DIAGNOSIS — M6281 Muscle weakness (generalized): Secondary | ICD-10-CM | POA: Diagnosis not present

## 2023-03-15 DIAGNOSIS — M6259 Muscle wasting and atrophy, not elsewhere classified, multiple sites: Secondary | ICD-10-CM | POA: Diagnosis not present

## 2023-03-15 DIAGNOSIS — R41841 Cognitive communication deficit: Secondary | ICD-10-CM | POA: Diagnosis not present

## 2023-03-16 DIAGNOSIS — R41841 Cognitive communication deficit: Secondary | ICD-10-CM | POA: Diagnosis not present

## 2023-03-16 DIAGNOSIS — R1312 Dysphagia, oropharyngeal phase: Secondary | ICD-10-CM | POA: Diagnosis not present

## 2023-03-16 DIAGNOSIS — M6281 Muscle weakness (generalized): Secondary | ICD-10-CM | POA: Diagnosis not present

## 2023-03-16 DIAGNOSIS — E0590 Thyrotoxicosis, unspecified without thyrotoxic crisis or storm: Secondary | ICD-10-CM | POA: Diagnosis not present

## 2023-03-16 DIAGNOSIS — D649 Anemia, unspecified: Secondary | ICD-10-CM | POA: Diagnosis not present

## 2023-03-16 DIAGNOSIS — M6259 Muscle wasting and atrophy, not elsewhere classified, multiple sites: Secondary | ICD-10-CM | POA: Diagnosis not present

## 2023-03-17 DIAGNOSIS — R1312 Dysphagia, oropharyngeal phase: Secondary | ICD-10-CM | POA: Diagnosis not present

## 2023-03-17 DIAGNOSIS — M6281 Muscle weakness (generalized): Secondary | ICD-10-CM | POA: Diagnosis not present

## 2023-03-17 DIAGNOSIS — M6259 Muscle wasting and atrophy, not elsewhere classified, multiple sites: Secondary | ICD-10-CM | POA: Diagnosis not present

## 2023-03-17 DIAGNOSIS — R41841 Cognitive communication deficit: Secondary | ICD-10-CM | POA: Diagnosis not present

## 2023-03-18 DIAGNOSIS — M6259 Muscle wasting and atrophy, not elsewhere classified, multiple sites: Secondary | ICD-10-CM | POA: Diagnosis not present

## 2023-03-18 DIAGNOSIS — M6281 Muscle weakness (generalized): Secondary | ICD-10-CM | POA: Diagnosis not present

## 2023-03-18 DIAGNOSIS — R41841 Cognitive communication deficit: Secondary | ICD-10-CM | POA: Diagnosis not present

## 2023-03-18 DIAGNOSIS — R1312 Dysphagia, oropharyngeal phase: Secondary | ICD-10-CM | POA: Diagnosis not present

## 2023-03-19 DIAGNOSIS — M6259 Muscle wasting and atrophy, not elsewhere classified, multiple sites: Secondary | ICD-10-CM | POA: Diagnosis not present

## 2023-03-19 DIAGNOSIS — R41841 Cognitive communication deficit: Secondary | ICD-10-CM | POA: Diagnosis not present

## 2023-03-19 DIAGNOSIS — R1312 Dysphagia, oropharyngeal phase: Secondary | ICD-10-CM | POA: Diagnosis not present

## 2023-03-19 DIAGNOSIS — M6281 Muscle weakness (generalized): Secondary | ICD-10-CM | POA: Diagnosis not present

## 2023-03-22 DIAGNOSIS — M6259 Muscle wasting and atrophy, not elsewhere classified, multiple sites: Secondary | ICD-10-CM | POA: Diagnosis not present

## 2023-03-22 DIAGNOSIS — R41841 Cognitive communication deficit: Secondary | ICD-10-CM | POA: Diagnosis not present

## 2023-03-22 DIAGNOSIS — M6281 Muscle weakness (generalized): Secondary | ICD-10-CM | POA: Diagnosis not present

## 2023-03-22 DIAGNOSIS — R1312 Dysphagia, oropharyngeal phase: Secondary | ICD-10-CM | POA: Diagnosis not present

## 2023-03-23 DIAGNOSIS — R1312 Dysphagia, oropharyngeal phase: Secondary | ICD-10-CM | POA: Diagnosis not present

## 2023-03-23 DIAGNOSIS — M6259 Muscle wasting and atrophy, not elsewhere classified, multiple sites: Secondary | ICD-10-CM | POA: Diagnosis not present

## 2023-03-23 DIAGNOSIS — M6281 Muscle weakness (generalized): Secondary | ICD-10-CM | POA: Diagnosis not present

## 2023-03-23 DIAGNOSIS — R41841 Cognitive communication deficit: Secondary | ICD-10-CM | POA: Diagnosis not present

## 2023-03-24 DIAGNOSIS — M6281 Muscle weakness (generalized): Secondary | ICD-10-CM | POA: Diagnosis not present

## 2023-03-24 DIAGNOSIS — R1312 Dysphagia, oropharyngeal phase: Secondary | ICD-10-CM | POA: Diagnosis not present

## 2023-03-24 DIAGNOSIS — M6259 Muscle wasting and atrophy, not elsewhere classified, multiple sites: Secondary | ICD-10-CM | POA: Diagnosis not present

## 2023-03-24 DIAGNOSIS — R41841 Cognitive communication deficit: Secondary | ICD-10-CM | POA: Diagnosis not present

## 2023-03-25 DIAGNOSIS — R1312 Dysphagia, oropharyngeal phase: Secondary | ICD-10-CM | POA: Diagnosis not present

## 2023-03-25 DIAGNOSIS — M6281 Muscle weakness (generalized): Secondary | ICD-10-CM | POA: Diagnosis not present

## 2023-03-25 DIAGNOSIS — M6259 Muscle wasting and atrophy, not elsewhere classified, multiple sites: Secondary | ICD-10-CM | POA: Diagnosis not present

## 2023-03-25 DIAGNOSIS — R41841 Cognitive communication deficit: Secondary | ICD-10-CM | POA: Diagnosis not present

## 2023-03-29 DIAGNOSIS — R41841 Cognitive communication deficit: Secondary | ICD-10-CM | POA: Diagnosis not present

## 2023-03-29 DIAGNOSIS — R1312 Dysphagia, oropharyngeal phase: Secondary | ICD-10-CM | POA: Diagnosis not present

## 2023-03-29 DIAGNOSIS — M6281 Muscle weakness (generalized): Secondary | ICD-10-CM | POA: Diagnosis not present

## 2023-03-29 DIAGNOSIS — M6259 Muscle wasting and atrophy, not elsewhere classified, multiple sites: Secondary | ICD-10-CM | POA: Diagnosis not present

## 2023-03-30 DIAGNOSIS — M6281 Muscle weakness (generalized): Secondary | ICD-10-CM | POA: Diagnosis not present

## 2023-03-30 DIAGNOSIS — R1312 Dysphagia, oropharyngeal phase: Secondary | ICD-10-CM | POA: Diagnosis not present

## 2023-03-30 DIAGNOSIS — M6259 Muscle wasting and atrophy, not elsewhere classified, multiple sites: Secondary | ICD-10-CM | POA: Diagnosis not present

## 2023-03-30 DIAGNOSIS — R41841 Cognitive communication deficit: Secondary | ICD-10-CM | POA: Diagnosis not present

## 2023-03-31 DIAGNOSIS — M6259 Muscle wasting and atrophy, not elsewhere classified, multiple sites: Secondary | ICD-10-CM | POA: Diagnosis not present

## 2023-03-31 DIAGNOSIS — R1312 Dysphagia, oropharyngeal phase: Secondary | ICD-10-CM | POA: Diagnosis not present

## 2023-03-31 DIAGNOSIS — M6281 Muscle weakness (generalized): Secondary | ICD-10-CM | POA: Diagnosis not present

## 2023-03-31 DIAGNOSIS — R41841 Cognitive communication deficit: Secondary | ICD-10-CM | POA: Diagnosis not present

## 2023-04-02 DIAGNOSIS — M6259 Muscle wasting and atrophy, not elsewhere classified, multiple sites: Secondary | ICD-10-CM | POA: Diagnosis not present

## 2023-04-02 DIAGNOSIS — M6281 Muscle weakness (generalized): Secondary | ICD-10-CM | POA: Diagnosis not present

## 2023-04-02 DIAGNOSIS — R1312 Dysphagia, oropharyngeal phase: Secondary | ICD-10-CM | POA: Diagnosis not present

## 2023-04-02 DIAGNOSIS — R41841 Cognitive communication deficit: Secondary | ICD-10-CM | POA: Diagnosis not present

## 2023-04-05 DIAGNOSIS — R41841 Cognitive communication deficit: Secondary | ICD-10-CM | POA: Diagnosis not present

## 2023-04-05 DIAGNOSIS — R1312 Dysphagia, oropharyngeal phase: Secondary | ICD-10-CM | POA: Diagnosis not present

## 2023-04-05 DIAGNOSIS — M6281 Muscle weakness (generalized): Secondary | ICD-10-CM | POA: Diagnosis not present

## 2023-04-05 DIAGNOSIS — M6259 Muscle wasting and atrophy, not elsewhere classified, multiple sites: Secondary | ICD-10-CM | POA: Diagnosis not present

## 2023-04-06 DIAGNOSIS — M6281 Muscle weakness (generalized): Secondary | ICD-10-CM | POA: Diagnosis not present

## 2023-04-06 DIAGNOSIS — R41841 Cognitive communication deficit: Secondary | ICD-10-CM | POA: Diagnosis not present

## 2023-04-06 DIAGNOSIS — R1312 Dysphagia, oropharyngeal phase: Secondary | ICD-10-CM | POA: Diagnosis not present

## 2023-04-06 DIAGNOSIS — M6259 Muscle wasting and atrophy, not elsewhere classified, multiple sites: Secondary | ICD-10-CM | POA: Diagnosis not present

## 2023-04-07 DIAGNOSIS — R1312 Dysphagia, oropharyngeal phase: Secondary | ICD-10-CM | POA: Diagnosis not present

## 2023-04-07 DIAGNOSIS — M6281 Muscle weakness (generalized): Secondary | ICD-10-CM | POA: Diagnosis not present

## 2023-04-07 DIAGNOSIS — R41841 Cognitive communication deficit: Secondary | ICD-10-CM | POA: Diagnosis not present

## 2023-04-07 DIAGNOSIS — M6259 Muscle wasting and atrophy, not elsewhere classified, multiple sites: Secondary | ICD-10-CM | POA: Diagnosis not present

## 2023-04-09 DIAGNOSIS — M6259 Muscle wasting and atrophy, not elsewhere classified, multiple sites: Secondary | ICD-10-CM | POA: Diagnosis not present

## 2023-04-09 DIAGNOSIS — R1312 Dysphagia, oropharyngeal phase: Secondary | ICD-10-CM | POA: Diagnosis not present

## 2023-04-09 DIAGNOSIS — R41841 Cognitive communication deficit: Secondary | ICD-10-CM | POA: Diagnosis not present

## 2023-04-09 DIAGNOSIS — M6281 Muscle weakness (generalized): Secondary | ICD-10-CM | POA: Diagnosis not present

## 2023-04-12 DIAGNOSIS — F03C2 Unspecified dementia, severe, with psychotic disturbance: Secondary | ICD-10-CM | POA: Diagnosis not present

## 2023-04-12 DIAGNOSIS — F5101 Primary insomnia: Secondary | ICD-10-CM | POA: Diagnosis not present

## 2023-04-19 DIAGNOSIS — M6259 Muscle wasting and atrophy, not elsewhere classified, multiple sites: Secondary | ICD-10-CM | POA: Diagnosis not present

## 2023-04-19 DIAGNOSIS — R2689 Other abnormalities of gait and mobility: Secondary | ICD-10-CM | POA: Diagnosis not present

## 2023-04-19 DIAGNOSIS — M6281 Muscle weakness (generalized): Secondary | ICD-10-CM | POA: Diagnosis not present

## 2023-04-20 DIAGNOSIS — R2689 Other abnormalities of gait and mobility: Secondary | ICD-10-CM | POA: Diagnosis not present

## 2023-04-20 DIAGNOSIS — M6259 Muscle wasting and atrophy, not elsewhere classified, multiple sites: Secondary | ICD-10-CM | POA: Diagnosis not present

## 2023-04-20 DIAGNOSIS — M6281 Muscle weakness (generalized): Secondary | ICD-10-CM | POA: Diagnosis not present

## 2023-04-21 DIAGNOSIS — R2689 Other abnormalities of gait and mobility: Secondary | ICD-10-CM | POA: Diagnosis not present

## 2023-04-21 DIAGNOSIS — M6259 Muscle wasting and atrophy, not elsewhere classified, multiple sites: Secondary | ICD-10-CM | POA: Diagnosis not present

## 2023-04-21 DIAGNOSIS — M6281 Muscle weakness (generalized): Secondary | ICD-10-CM | POA: Diagnosis not present

## 2023-04-22 DIAGNOSIS — M6281 Muscle weakness (generalized): Secondary | ICD-10-CM | POA: Diagnosis not present

## 2023-04-22 DIAGNOSIS — M6259 Muscle wasting and atrophy, not elsewhere classified, multiple sites: Secondary | ICD-10-CM | POA: Diagnosis not present

## 2023-04-22 DIAGNOSIS — R2689 Other abnormalities of gait and mobility: Secondary | ICD-10-CM | POA: Diagnosis not present

## 2023-04-22 DIAGNOSIS — D649 Anemia, unspecified: Secondary | ICD-10-CM | POA: Diagnosis not present

## 2023-04-23 DIAGNOSIS — R2689 Other abnormalities of gait and mobility: Secondary | ICD-10-CM | POA: Diagnosis not present

## 2023-04-23 DIAGNOSIS — M6281 Muscle weakness (generalized): Secondary | ICD-10-CM | POA: Diagnosis not present

## 2023-04-23 DIAGNOSIS — M6259 Muscle wasting and atrophy, not elsewhere classified, multiple sites: Secondary | ICD-10-CM | POA: Diagnosis not present

## 2023-04-27 DIAGNOSIS — M6259 Muscle wasting and atrophy, not elsewhere classified, multiple sites: Secondary | ICD-10-CM | POA: Diagnosis not present

## 2023-04-27 DIAGNOSIS — M6281 Muscle weakness (generalized): Secondary | ICD-10-CM | POA: Diagnosis not present

## 2023-04-27 DIAGNOSIS — R2689 Other abnormalities of gait and mobility: Secondary | ICD-10-CM | POA: Diagnosis not present

## 2023-04-28 DIAGNOSIS — M6281 Muscle weakness (generalized): Secondary | ICD-10-CM | POA: Diagnosis not present

## 2023-04-28 DIAGNOSIS — M6259 Muscle wasting and atrophy, not elsewhere classified, multiple sites: Secondary | ICD-10-CM | POA: Diagnosis not present

## 2023-04-28 DIAGNOSIS — R2689 Other abnormalities of gait and mobility: Secondary | ICD-10-CM | POA: Diagnosis not present

## 2023-04-30 DIAGNOSIS — M6259 Muscle wasting and atrophy, not elsewhere classified, multiple sites: Secondary | ICD-10-CM | POA: Diagnosis not present

## 2023-04-30 DIAGNOSIS — M6281 Muscle weakness (generalized): Secondary | ICD-10-CM | POA: Diagnosis not present

## 2023-04-30 DIAGNOSIS — R2689 Other abnormalities of gait and mobility: Secondary | ICD-10-CM | POA: Diagnosis not present

## 2023-05-03 DIAGNOSIS — M6281 Muscle weakness (generalized): Secondary | ICD-10-CM | POA: Diagnosis not present

## 2023-05-03 DIAGNOSIS — M6259 Muscle wasting and atrophy, not elsewhere classified, multiple sites: Secondary | ICD-10-CM | POA: Diagnosis not present

## 2023-05-03 DIAGNOSIS — R2689 Other abnormalities of gait and mobility: Secondary | ICD-10-CM | POA: Diagnosis not present

## 2023-05-05 DIAGNOSIS — M6281 Muscle weakness (generalized): Secondary | ICD-10-CM | POA: Diagnosis not present

## 2023-05-05 DIAGNOSIS — M6259 Muscle wasting and atrophy, not elsewhere classified, multiple sites: Secondary | ICD-10-CM | POA: Diagnosis not present

## 2023-05-05 DIAGNOSIS — R2689 Other abnormalities of gait and mobility: Secondary | ICD-10-CM | POA: Diagnosis not present

## 2023-05-06 DIAGNOSIS — D509 Iron deficiency anemia, unspecified: Secondary | ICD-10-CM | POA: Diagnosis not present

## 2023-05-06 DIAGNOSIS — R2689 Other abnormalities of gait and mobility: Secondary | ICD-10-CM | POA: Diagnosis not present

## 2023-05-06 DIAGNOSIS — M6281 Muscle weakness (generalized): Secondary | ICD-10-CM | POA: Diagnosis not present

## 2023-05-06 DIAGNOSIS — N950 Postmenopausal bleeding: Secondary | ICD-10-CM | POA: Diagnosis not present

## 2023-05-06 DIAGNOSIS — R634 Abnormal weight loss: Secondary | ICD-10-CM | POA: Diagnosis not present

## 2023-05-06 DIAGNOSIS — N183 Chronic kidney disease, stage 3 unspecified: Secondary | ICD-10-CM | POA: Diagnosis not present

## 2023-05-06 DIAGNOSIS — R262 Difficulty in walking, not elsewhere classified: Secondary | ICD-10-CM | POA: Diagnosis not present

## 2023-05-06 DIAGNOSIS — M6259 Muscle wasting and atrophy, not elsewhere classified, multiple sites: Secondary | ICD-10-CM | POA: Diagnosis not present

## 2023-05-06 DIAGNOSIS — F02C4 Dementia in other diseases classified elsewhere, severe, with anxiety: Secondary | ICD-10-CM | POA: Diagnosis not present

## 2023-05-06 DIAGNOSIS — E039 Hypothyroidism, unspecified: Secondary | ICD-10-CM | POA: Diagnosis not present

## 2023-05-06 DIAGNOSIS — I10 Essential (primary) hypertension: Secondary | ICD-10-CM | POA: Diagnosis not present

## 2023-05-06 DIAGNOSIS — Z6826 Body mass index (BMI) 26.0-26.9, adult: Secondary | ICD-10-CM | POA: Diagnosis not present

## 2023-05-06 DIAGNOSIS — G3183 Dementia with Lewy bodies: Secondary | ICD-10-CM | POA: Diagnosis not present

## 2023-05-06 DIAGNOSIS — E519 Thiamine deficiency, unspecified: Secondary | ICD-10-CM | POA: Diagnosis not present

## 2023-05-06 DIAGNOSIS — F32A Depression, unspecified: Secondary | ICD-10-CM | POA: Diagnosis not present

## 2023-05-06 DIAGNOSIS — D631 Anemia in chronic kidney disease: Secondary | ICD-10-CM | POA: Diagnosis not present

## 2023-05-06 DIAGNOSIS — F02C3 Dementia in other diseases classified elsewhere, severe, with mood disturbance: Secondary | ICD-10-CM | POA: Diagnosis not present

## 2023-05-09 ENCOUNTER — Other Ambulatory Visit: Payer: Self-pay | Admitting: Neurology

## 2023-05-10 DIAGNOSIS — M6259 Muscle wasting and atrophy, not elsewhere classified, multiple sites: Secondary | ICD-10-CM | POA: Diagnosis not present

## 2023-05-10 DIAGNOSIS — M6281 Muscle weakness (generalized): Secondary | ICD-10-CM | POA: Diagnosis not present

## 2023-05-10 DIAGNOSIS — R2689 Other abnormalities of gait and mobility: Secondary | ICD-10-CM | POA: Diagnosis not present

## 2023-05-11 DIAGNOSIS — M6281 Muscle weakness (generalized): Secondary | ICD-10-CM | POA: Diagnosis not present

## 2023-05-11 DIAGNOSIS — M6259 Muscle wasting and atrophy, not elsewhere classified, multiple sites: Secondary | ICD-10-CM | POA: Diagnosis not present

## 2023-05-11 DIAGNOSIS — R2689 Other abnormalities of gait and mobility: Secondary | ICD-10-CM | POA: Diagnosis not present

## 2023-05-12 DIAGNOSIS — M6281 Muscle weakness (generalized): Secondary | ICD-10-CM | POA: Diagnosis not present

## 2023-05-12 DIAGNOSIS — R2689 Other abnormalities of gait and mobility: Secondary | ICD-10-CM | POA: Diagnosis not present

## 2023-05-12 DIAGNOSIS — M6259 Muscle wasting and atrophy, not elsewhere classified, multiple sites: Secondary | ICD-10-CM | POA: Diagnosis not present

## 2023-05-13 DIAGNOSIS — Z23 Encounter for immunization: Secondary | ICD-10-CM | POA: Diagnosis not present

## 2023-05-13 DIAGNOSIS — M6281 Muscle weakness (generalized): Secondary | ICD-10-CM | POA: Diagnosis not present

## 2023-05-13 DIAGNOSIS — R2689 Other abnormalities of gait and mobility: Secondary | ICD-10-CM | POA: Diagnosis not present

## 2023-05-13 DIAGNOSIS — M6259 Muscle wasting and atrophy, not elsewhere classified, multiple sites: Secondary | ICD-10-CM | POA: Diagnosis not present

## 2023-05-17 DIAGNOSIS — M6259 Muscle wasting and atrophy, not elsewhere classified, multiple sites: Secondary | ICD-10-CM | POA: Diagnosis not present

## 2023-05-17 DIAGNOSIS — M6281 Muscle weakness (generalized): Secondary | ICD-10-CM | POA: Diagnosis not present

## 2023-05-17 DIAGNOSIS — R2689 Other abnormalities of gait and mobility: Secondary | ICD-10-CM | POA: Diagnosis not present

## 2023-05-18 DIAGNOSIS — M6259 Muscle wasting and atrophy, not elsewhere classified, multiple sites: Secondary | ICD-10-CM | POA: Diagnosis not present

## 2023-05-18 DIAGNOSIS — M6281 Muscle weakness (generalized): Secondary | ICD-10-CM | POA: Diagnosis not present

## 2023-05-18 DIAGNOSIS — R2689 Other abnormalities of gait and mobility: Secondary | ICD-10-CM | POA: Diagnosis not present

## 2023-05-19 DIAGNOSIS — R2689 Other abnormalities of gait and mobility: Secondary | ICD-10-CM | POA: Diagnosis not present

## 2023-05-19 DIAGNOSIS — M6281 Muscle weakness (generalized): Secondary | ICD-10-CM | POA: Diagnosis not present

## 2023-05-19 DIAGNOSIS — M6259 Muscle wasting and atrophy, not elsewhere classified, multiple sites: Secondary | ICD-10-CM | POA: Diagnosis not present

## 2023-05-20 ENCOUNTER — Emergency Department (HOSPITAL_COMMUNITY): Payer: Medicare Other

## 2023-05-20 ENCOUNTER — Encounter (HOSPITAL_COMMUNITY): Payer: Self-pay | Admitting: *Deleted

## 2023-05-20 ENCOUNTER — Other Ambulatory Visit: Payer: Self-pay

## 2023-05-20 ENCOUNTER — Emergency Department (HOSPITAL_COMMUNITY)
Admission: EM | Admit: 2023-05-20 | Discharge: 2023-05-20 | Disposition: A | Discharge status: Home / Self Care | Payer: Medicare Other | Source: Home / Self Care | Attending: Emergency Medicine | Admitting: Emergency Medicine

## 2023-05-20 DIAGNOSIS — S199XXA Unspecified injury of neck, initial encounter: Secondary | ICD-10-CM | POA: Diagnosis not present

## 2023-05-20 DIAGNOSIS — R4701 Aphasia: Secondary | ICD-10-CM | POA: Diagnosis present

## 2023-05-20 DIAGNOSIS — R1311 Dysphagia, oral phase: Secondary | ICD-10-CM | POA: Diagnosis not present

## 2023-05-20 DIAGNOSIS — Z9181 History of falling: Secondary | ICD-10-CM | POA: Diagnosis not present

## 2023-05-20 DIAGNOSIS — F0390 Unspecified dementia without behavioral disturbance: Secondary | ICD-10-CM | POA: Diagnosis not present

## 2023-05-20 DIAGNOSIS — Z741 Need for assistance with personal care: Secondary | ICD-10-CM | POA: Diagnosis not present

## 2023-05-20 DIAGNOSIS — F32A Depression, unspecified: Secondary | ICD-10-CM | POA: Diagnosis not present

## 2023-05-20 DIAGNOSIS — R296 Repeated falls: Secondary | ICD-10-CM | POA: Diagnosis present

## 2023-05-20 DIAGNOSIS — S50312A Abrasion of left elbow, initial encounter: Secondary | ICD-10-CM | POA: Insufficient documentation

## 2023-05-20 DIAGNOSIS — I672 Cerebral atherosclerosis: Secondary | ICD-10-CM | POA: Diagnosis not present

## 2023-05-20 DIAGNOSIS — R569 Unspecified convulsions: Secondary | ICD-10-CM | POA: Diagnosis not present

## 2023-05-20 DIAGNOSIS — R001 Bradycardia, unspecified: Secondary | ICD-10-CM | POA: Diagnosis not present

## 2023-05-20 DIAGNOSIS — Z043 Encounter for examination and observation following other accident: Secondary | ICD-10-CM | POA: Diagnosis not present

## 2023-05-20 DIAGNOSIS — Z885 Allergy status to narcotic agent status: Secondary | ICD-10-CM | POA: Diagnosis not present

## 2023-05-20 DIAGNOSIS — G44309 Post-traumatic headache, unspecified, not intractable: Secondary | ICD-10-CM | POA: Diagnosis not present

## 2023-05-20 DIAGNOSIS — F4323 Adjustment disorder with mixed anxiety and depressed mood: Secondary | ICD-10-CM | POA: Diagnosis present

## 2023-05-20 DIAGNOSIS — I129 Hypertensive chronic kidney disease with stage 1 through stage 4 chronic kidney disease, or unspecified chronic kidney disease: Secondary | ICD-10-CM | POA: Diagnosis present

## 2023-05-20 DIAGNOSIS — I6503 Occlusion and stenosis of bilateral vertebral arteries: Secondary | ICD-10-CM | POA: Diagnosis not present

## 2023-05-20 DIAGNOSIS — G3183 Dementia with Lewy bodies: Secondary | ICD-10-CM | POA: Diagnosis not present

## 2023-05-20 DIAGNOSIS — F0284 Dementia in other diseases classified elsewhere, unspecified severity, with anxiety: Secondary | ICD-10-CM | POA: Diagnosis present

## 2023-05-20 DIAGNOSIS — R29818 Other symptoms and signs involving the nervous system: Secondary | ICD-10-CM | POA: Diagnosis not present

## 2023-05-20 DIAGNOSIS — Z79899 Other long term (current) drug therapy: Secondary | ICD-10-CM | POA: Insufficient documentation

## 2023-05-20 DIAGNOSIS — G9341 Metabolic encephalopathy: Secondary | ICD-10-CM | POA: Diagnosis not present

## 2023-05-20 DIAGNOSIS — E876 Hypokalemia: Secondary | ICD-10-CM | POA: Diagnosis not present

## 2023-05-20 DIAGNOSIS — G9340 Encephalopathy, unspecified: Secondary | ICD-10-CM | POA: Diagnosis not present

## 2023-05-20 DIAGNOSIS — Z7401 Bed confinement status: Secondary | ICD-10-CM | POA: Diagnosis not present

## 2023-05-20 DIAGNOSIS — I700 Atherosclerosis of aorta: Secondary | ICD-10-CM | POA: Diagnosis not present

## 2023-05-20 DIAGNOSIS — R2689 Other abnormalities of gait and mobility: Secondary | ICD-10-CM | POA: Diagnosis not present

## 2023-05-20 DIAGNOSIS — W19XXXA Unspecified fall, initial encounter: Secondary | ICD-10-CM

## 2023-05-20 DIAGNOSIS — Z90710 Acquired absence of both cervix and uterus: Secondary | ICD-10-CM | POA: Diagnosis not present

## 2023-05-20 DIAGNOSIS — F0283 Dementia in other diseases classified elsewhere, unspecified severity, with mood disturbance: Secondary | ICD-10-CM | POA: Diagnosis present

## 2023-05-20 DIAGNOSIS — R410 Disorientation, unspecified: Secondary | ICD-10-CM | POA: Diagnosis not present

## 2023-05-20 DIAGNOSIS — R531 Weakness: Secondary | ICD-10-CM | POA: Diagnosis not present

## 2023-05-20 DIAGNOSIS — R2681 Unsteadiness on feet: Secondary | ICD-10-CM | POA: Diagnosis not present

## 2023-05-20 DIAGNOSIS — F419 Anxiety disorder, unspecified: Secondary | ICD-10-CM | POA: Diagnosis not present

## 2023-05-20 DIAGNOSIS — R0989 Other specified symptoms and signs involving the circulatory and respiratory systems: Secondary | ICD-10-CM | POA: Diagnosis not present

## 2023-05-20 DIAGNOSIS — E039 Hypothyroidism, unspecified: Secondary | ICD-10-CM | POA: Diagnosis not present

## 2023-05-20 DIAGNOSIS — D631 Anemia in chronic kidney disease: Secondary | ICD-10-CM | POA: Diagnosis present

## 2023-05-20 DIAGNOSIS — N1832 Chronic kidney disease, stage 3b: Secondary | ICD-10-CM | POA: Diagnosis not present

## 2023-05-20 DIAGNOSIS — R41841 Cognitive communication deficit: Secondary | ICD-10-CM | POA: Diagnosis not present

## 2023-05-20 DIAGNOSIS — I160 Hypertensive urgency: Secondary | ICD-10-CM | POA: Diagnosis not present

## 2023-05-20 DIAGNOSIS — R4182 Altered mental status, unspecified: Secondary | ICD-10-CM | POA: Diagnosis not present

## 2023-05-20 DIAGNOSIS — I10 Essential (primary) hypertension: Secondary | ICD-10-CM | POA: Diagnosis not present

## 2023-05-20 DIAGNOSIS — S0990XA Unspecified injury of head, initial encounter: Secondary | ICD-10-CM | POA: Diagnosis not present

## 2023-05-20 DIAGNOSIS — Z981 Arthrodesis status: Secondary | ICD-10-CM | POA: Diagnosis not present

## 2023-05-20 DIAGNOSIS — I517 Cardiomegaly: Secondary | ICD-10-CM | POA: Diagnosis not present

## 2023-05-20 DIAGNOSIS — Y92129 Unspecified place in nursing home as the place of occurrence of the external cause: Secondary | ICD-10-CM | POA: Diagnosis not present

## 2023-05-20 DIAGNOSIS — I6529 Occlusion and stenosis of unspecified carotid artery: Secondary | ICD-10-CM | POA: Diagnosis not present

## 2023-05-20 DIAGNOSIS — R4781 Slurred speech: Secondary | ICD-10-CM | POA: Diagnosis not present

## 2023-05-20 DIAGNOSIS — G4710 Hypersomnia, unspecified: Secondary | ICD-10-CM | POA: Diagnosis not present

## 2023-05-20 DIAGNOSIS — Z79890 Hormone replacement therapy (postmenopausal): Secondary | ICD-10-CM | POA: Diagnosis not present

## 2023-05-20 DIAGNOSIS — K219 Gastro-esophageal reflux disease without esophagitis: Secondary | ICD-10-CM | POA: Diagnosis not present

## 2023-05-20 DIAGNOSIS — I6782 Cerebral ischemia: Secondary | ICD-10-CM | POA: Diagnosis not present

## 2023-05-20 DIAGNOSIS — Z87891 Personal history of nicotine dependence: Secondary | ICD-10-CM | POA: Diagnosis not present

## 2023-05-20 DIAGNOSIS — W01198A Fall on same level from slipping, tripping and stumbling with subsequent striking against other object, initial encounter: Secondary | ICD-10-CM | POA: Insufficient documentation

## 2023-05-20 DIAGNOSIS — M4696 Unspecified inflammatory spondylopathy, lumbar region: Secondary | ICD-10-CM | POA: Diagnosis not present

## 2023-05-20 DIAGNOSIS — M6281 Muscle weakness (generalized): Secondary | ICD-10-CM | POA: Diagnosis not present

## 2023-05-20 DIAGNOSIS — D638 Anemia in other chronic diseases classified elsewhere: Secondary | ICD-10-CM | POA: Diagnosis not present

## 2023-05-20 DIAGNOSIS — R471 Dysarthria and anarthria: Secondary | ICD-10-CM | POA: Diagnosis not present

## 2023-05-20 DIAGNOSIS — Z66 Do not resuscitate: Secondary | ICD-10-CM | POA: Diagnosis present

## 2023-05-20 DIAGNOSIS — I674 Hypertensive encephalopathy: Secondary | ICD-10-CM | POA: Diagnosis present

## 2023-05-20 NOTE — ED Notes (Signed)
Pt was able to ambulate with front-wheel walker from hall bed to BR approx 50 ft and back, steady gait noted. Pt assisted back to bed, vitals reassess, side rails up x2, pt's son at bedside.

## 2023-05-20 NOTE — ED Provider Notes (Signed)
[place] EMERGENCY DEPARTMENT AT [facility] Provider Note   CSN: [id number] Arrival date & time: 05/20/23  1824     History  Chief Complaint  Patient presents with   [name] is a 89 y.o. female.  89 year old female with prior medical history as detailed below presents for evaluation after fall.  Patient with  history of frequent falls per family at bedside.  Patient did strike her head.  She has a superficial abrasion to the left elbow.  Otherwise she is at baseline per family member at bedside.    The history is provided by the patient and medical records.       Home Medications Prior to Admission medications   Medication Sig Start Date End Date Taking? Authorizing Provider  amLODipine (NORVASC) 10 MG tablet TAKE 1 TABLET BY MOUTH DAILY Patient taking differently: Take 10 mg by mouth daily. 06/23/22   Nafziger, Kandee Keen, NP  Brexpiprazole (REXULTI) 0.5 MG TABS TAKE 1 TABLET BY MOUTH AT NIGHT 05/11/23   Van Clines, MD  diphenhydramine-acetaminophen (TYLENOL PM) 25-500 MG TABS tablet Take 1 tablet by mouth at bedtime as needed (for pain/sleep).    [provider]  docusate sodium (COLACE) 100 MG capsule Take 1 capsule (100 mg total) by mouth 2 (two) times daily. 12/11/22   Hongalgi, Maximino Greenland, MD  donepezil (ARICEPT) 10 MG tablet Take 1 tablet (10 mg total) by mouth at bedtime. 12/11/22   Hongalgi, Maximino Greenland, MD  levothyroxine (SYNTHROID) 150 MCG tablet Take 1 tablet (150 mcg total) by mouth daily before breakfast. 12/11/22   Hongalgi, Maximino Greenland, MD  memantine (NAMENDA) 10 MG tablet TAKE 1 TABLET BY MOUTH IN THE  MORNING AND AT BEDTIME Patient taking differently: Take 10 mg by mouth 2 (two) times daily. 12/07/22   Van Clines, MD  Multiple Vitamins-Minerals (ONE-A-DAY WOMENS 50+) TABS Take 1 tablet by mouth daily with breakfast.    [provider]  Multiple Vitamins-Minerals (PRESERVISION AREDS 2 PO) Take 1 capsule by mouth daily with  breakfast.    [provider]  olmesartan (BENICAR) 20 MG tablet Take 0.5 tablets (10 mg total) by mouth daily. 12/11/22   Hongalgi, Maximino Greenland, MD  omeprazole (PRILOSEC) 40 MG capsule TAKE 1 CAPSULE BY MOUTH DAILY Patient taking differently: in the morning and at bedtime. 06/23/22   Nafziger, Kandee Keen, NP  polyethylene glycol (MIRALAX / GLYCOLAX) 17 g packet Take 17 g by mouth daily as needed for mild constipation. 12/11/22   Hongalgi, Maximino Greenland, MD  QUEtiapine (SEROQUEL) 50 MG tablet TAKE 1 TABLET BY MOUTH TWICE  DAILY 02/01/23   Van Clines, MD  sertraline (ZOLOFT) 100 MG tablet Take 1 tablet (100 mg total) by mouth in the morning and at bedtime. 12/11/22   Hongalgi, Maximino Greenland, MD  SYSTANE ULTRA PF 0.4-0.3 % SOLN Place 1 drop into both eyes 3 (three) times daily as needed (for dryness).    [provider]  TYLENOL 500 MG tablet Take 500 mg by mouth every 6 (six) hours as needed for mild pain or headache.    [provider]      Allergies    Oxycodone hcl    Review of Systems   Review of Systems  All other systems reviewed and are negative.   Physical Exam Updated Vital Signs BP (!) 219/61 (BP Location: Right Arm)   Pulse (!) 55   Temp 98.2 F (36.8 C) (Oral)   Resp  18   SpO2 95%  Physical Exam Vitals and nursing note reviewed.  Constitutional:      General: She is not in acute distress.    Appearance: Normal appearance. She is well-developed.  HENT:     Head: Normocephalic and atraumatic.  Eyes:     Conjunctiva/sclera: Conjunctivae normal.     Pupils: Pupils are equal, round, and reactive to light.  Cardiovascular:     Rate and Rhythm: Normal rate and regular rhythm.     Heart sounds: Normal heart sounds.  Pulmonary:     Effort: Pulmonary effort is normal. No respiratory distress.     Breath sounds: Normal breath sounds.  Abdominal:     General: There is no distension.     Palpations: Abdomen is soft.     Tenderness: There is no abdominal tenderness.   Musculoskeletal:        General: No deformity. Normal range of motion.     Cervical back: Normal range of motion and neck supple.  Skin:    General: Skin is warm and dry.     Comments: Superficial abrasion to the left lateral elbow -full active range of motion of the left elbow noted.  Distal left upper extremity is neurovascular tact.  Neurological:     General: No focal deficit present.     Mental Status: She is alert and oriented to person, place, and time.     ED Results / Procedures / Treatments   Labs (all labs ordered are listed, but only abnormal results are displayed) Labs Reviewed - No data to display  EKG None  Radiology No results found.  Procedures Procedures    Medications Ordered in ED Medications - No data to display  ED Course/ Medical Decision Making/ A&P                                 Medical Decision Making Amount and/or Complexity of Data Reviewed Radiology: ordered.    Medical Screen Complete  This patient presented to the ED with complaint of fall, head injury.  This complaint involves an extensive number of treatment options. The initial differential diagnosis includes, but is not limited to, trauma from fall  This presentation is: Acute, Self-Limited, Previously Undiagnosed, Uncertain Prognosis, Complicated, Systemic Symptoms, and Threat to Life/Bodily Function  Patient with history of frequent falls.  Patient presents after fall that occurred earlier today at [facility] and rehab.  Exam is not suggestive of significant traumatic injury.  Imaging obtained is without significant acute abnormality.  Patient is at baseline mental status per family member at bedside.  She is able to ambulate at baseline as well.  Patient is appropriate for discharge.  Importance of close follow-up is stressed with patient's family member at bedside.  Strict return precautions given and understood.  Additional history obtained:  Additional history  obtained from [facility] External records from outside sources obtained and reviewed including prior ED visits and prior Inpatient records.    Imaging Studies ordered:  I ordered imaging studies including plain films of pelvis, left elbow, chest, CT head, CT C-spine I independently visualized and interpreted obtained imaging which showed no acute fracture, questionable right superior pubic rami fracture. I agree with the radiologist interpretation.  Problem List / ED Course:  Fall   Reevaluation:  After the interventions noted above, I reevaluated the patient and found that they have: improved   Disposition:  After consideration of  the diagnostic results and the patients response to treatment, I feel that the patent would benefit from close outpatient followup.          Final Clinical Impression(s) / ED Diagnoses Final diagnoses:  Fall, initial encounter    Rx / DC Orders ED Discharge Orders     None         Wynetta Fines, MD 05/20/23 2207

## 2023-05-20 NOTE — ED Notes (Signed)
C-collar removed by Dr.Messick

## 2023-05-20 NOTE — ED Triage Notes (Signed)
BIB EMS from [facility], pt fell got up byself and reported to nurses she fell and hit head. C-collar placed by EMS, skin tear to left elbow and bruising to upper arm. Pt ambulatory with EMS, HTN 220/86- a&O x 1 pt has dementia.

## 2023-05-20 NOTE — Discharge Instructions (Signed)
Return for any problem.  ?

## 2023-05-20 NOTE — ED Notes (Signed)
Pt's son [name]) will transport pt back to facility, pt's son called to facility to confirm it was okay for him to transport pt instead of [facility], staff will meet pt once she arrives to help transport her back to her room at [facility].

## 2023-05-21 ENCOUNTER — Emergency Department (HOSPITAL_COMMUNITY): Payer: Medicare Other

## 2023-05-21 ENCOUNTER — Inpatient Hospital Stay (HOSPITAL_COMMUNITY)
Admission: EM | Admit: 2023-05-21 | Discharge: 2023-05-25 | DRG: 078 | Disposition: A | Discharge status: Skilled Nursing Facility | Payer: Medicare Other | Source: Skilled Nursing Facility | Attending: Family Medicine | Admitting: Family Medicine

## 2023-05-21 ENCOUNTER — Observation Stay (HOSPITAL_COMMUNITY): Payer: Medicare Other

## 2023-05-21 DIAGNOSIS — Z9181 History of falling: Secondary | ICD-10-CM

## 2023-05-21 DIAGNOSIS — I160 Hypertensive urgency: Secondary | ICD-10-CM | POA: Diagnosis not present

## 2023-05-21 DIAGNOSIS — F32A Depression, unspecified: Secondary | ICD-10-CM

## 2023-05-21 DIAGNOSIS — R29818 Other symptoms and signs involving the nervous system: Secondary | ICD-10-CM | POA: Diagnosis not present

## 2023-05-21 DIAGNOSIS — R4781 Slurred speech: Secondary | ICD-10-CM | POA: Diagnosis not present

## 2023-05-21 DIAGNOSIS — Z90710 Acquired absence of both cervix and uterus: Secondary | ICD-10-CM

## 2023-05-21 DIAGNOSIS — F0283 Dementia in other diseases classified elsewhere, unspecified severity, with mood disturbance: Secondary | ICD-10-CM | POA: Diagnosis present

## 2023-05-21 DIAGNOSIS — K219 Gastro-esophageal reflux disease without esophagitis: Secondary | ICD-10-CM | POA: Diagnosis present

## 2023-05-21 DIAGNOSIS — G3183 Dementia with Lewy bodies: Secondary | ICD-10-CM | POA: Diagnosis present

## 2023-05-21 DIAGNOSIS — F4323 Adjustment disorder with mixed anxiety and depressed mood: Secondary | ICD-10-CM | POA: Diagnosis present

## 2023-05-21 DIAGNOSIS — F419 Anxiety disorder, unspecified: Secondary | ICD-10-CM | POA: Diagnosis present

## 2023-05-21 DIAGNOSIS — G9340 Encephalopathy, unspecified: Secondary | ICD-10-CM | POA: Diagnosis present

## 2023-05-21 DIAGNOSIS — D638 Anemia in other chronic diseases classified elsewhere: Secondary | ICD-10-CM | POA: Diagnosis present

## 2023-05-21 DIAGNOSIS — Y92129 Unspecified place in nursing home as the place of occurrence of the external cause: Secondary | ICD-10-CM

## 2023-05-21 DIAGNOSIS — F0284 Dementia in other diseases classified elsewhere, unspecified severity, with anxiety: Secondary | ICD-10-CM | POA: Diagnosis present

## 2023-05-21 DIAGNOSIS — R569 Unspecified convulsions: Secondary | ICD-10-CM | POA: Diagnosis not present

## 2023-05-21 DIAGNOSIS — I6503 Occlusion and stenosis of bilateral vertebral arteries: Secondary | ICD-10-CM | POA: Diagnosis not present

## 2023-05-21 DIAGNOSIS — R471 Dysarthria and anarthria: Secondary | ICD-10-CM | POA: Diagnosis not present

## 2023-05-21 DIAGNOSIS — I129 Hypertensive chronic kidney disease with stage 1 through stage 4 chronic kidney disease, or unspecified chronic kidney disease: Secondary | ICD-10-CM | POA: Diagnosis present

## 2023-05-21 DIAGNOSIS — E039 Hypothyroidism, unspecified: Secondary | ICD-10-CM | POA: Diagnosis not present

## 2023-05-21 DIAGNOSIS — G4710 Hypersomnia, unspecified: Secondary | ICD-10-CM | POA: Diagnosis not present

## 2023-05-21 DIAGNOSIS — F0390 Unspecified dementia without behavioral disturbance: Secondary | ICD-10-CM | POA: Diagnosis present

## 2023-05-21 DIAGNOSIS — E876 Hypokalemia: Secondary | ICD-10-CM | POA: Diagnosis not present

## 2023-05-21 DIAGNOSIS — I674 Hypertensive encephalopathy: Principal | ICD-10-CM | POA: Diagnosis present

## 2023-05-21 DIAGNOSIS — N183 Chronic kidney disease, stage 3 unspecified: Secondary | ICD-10-CM | POA: Diagnosis present

## 2023-05-21 DIAGNOSIS — R4182 Altered mental status, unspecified: Secondary | ICD-10-CM | POA: Diagnosis not present

## 2023-05-21 DIAGNOSIS — R410 Disorientation, unspecified: Secondary | ICD-10-CM | POA: Diagnosis not present

## 2023-05-21 DIAGNOSIS — R001 Bradycardia, unspecified: Secondary | ICD-10-CM | POA: Diagnosis not present

## 2023-05-21 DIAGNOSIS — W19XXXA Unspecified fall, initial encounter: Secondary | ICD-10-CM | POA: Diagnosis not present

## 2023-05-21 DIAGNOSIS — F0280 Dementia in other diseases classified elsewhere without behavioral disturbance: Secondary | ICD-10-CM | POA: Diagnosis present

## 2023-05-21 DIAGNOSIS — D631 Anemia in chronic kidney disease: Secondary | ICD-10-CM | POA: Diagnosis present

## 2023-05-21 DIAGNOSIS — Z90722 Acquired absence of ovaries, bilateral: Secondary | ICD-10-CM

## 2023-05-21 DIAGNOSIS — W01198A Fall on same level from slipping, tripping and stumbling with subsequent striking against other object, initial encounter: Secondary | ICD-10-CM | POA: Diagnosis present

## 2023-05-21 DIAGNOSIS — S50312A Abrasion of left elbow, initial encounter: Secondary | ICD-10-CM | POA: Diagnosis present

## 2023-05-21 DIAGNOSIS — I6782 Cerebral ischemia: Secondary | ICD-10-CM | POA: Diagnosis not present

## 2023-05-21 DIAGNOSIS — N1832 Chronic kidney disease, stage 3b: Secondary | ICD-10-CM | POA: Diagnosis present

## 2023-05-21 DIAGNOSIS — R296 Repeated falls: Secondary | ICD-10-CM | POA: Diagnosis present

## 2023-05-21 DIAGNOSIS — R531 Weakness: Secondary | ICD-10-CM

## 2023-05-21 DIAGNOSIS — G44309 Post-traumatic headache, unspecified, not intractable: Secondary | ICD-10-CM | POA: Diagnosis not present

## 2023-05-21 DIAGNOSIS — Z79899 Other long term (current) drug therapy: Secondary | ICD-10-CM

## 2023-05-21 DIAGNOSIS — I10 Essential (primary) hypertension: Secondary | ICD-10-CM | POA: Diagnosis not present

## 2023-05-21 DIAGNOSIS — Z885 Allergy status to narcotic agent status: Secondary | ICD-10-CM

## 2023-05-21 DIAGNOSIS — Z87891 Personal history of nicotine dependence: Secondary | ICD-10-CM

## 2023-05-21 DIAGNOSIS — I672 Cerebral atherosclerosis: Secondary | ICD-10-CM | POA: Diagnosis not present

## 2023-05-21 DIAGNOSIS — G9341 Metabolic encephalopathy: Secondary | ICD-10-CM | POA: Diagnosis not present

## 2023-05-21 DIAGNOSIS — Z9049 Acquired absence of other specified parts of digestive tract: Secondary | ICD-10-CM

## 2023-05-21 DIAGNOSIS — Z8249 Family history of ischemic heart disease and other diseases of the circulatory system: Secondary | ICD-10-CM

## 2023-05-21 DIAGNOSIS — Z79890 Hormone replacement therapy (postmenopausal): Secondary | ICD-10-CM

## 2023-05-21 DIAGNOSIS — Z66 Do not resuscitate: Secondary | ICD-10-CM | POA: Diagnosis present

## 2023-05-21 DIAGNOSIS — R4701 Aphasia: Secondary | ICD-10-CM | POA: Diagnosis present

## 2023-05-21 LAB — I-STAT CHEM 8, ED
BUN: 12 mg/dL (ref 8–23)
Calcium, Ion: 1.13 mmol/L — ABNORMAL LOW (ref 1.15–1.40)
Chloride: 104 mmol/L (ref 98–111)
Creatinine, Ser: 1.4 mg/dL — ABNORMAL HIGH (ref 0.44–1.00)
Glucose, Bld: 132 mg/dL — ABNORMAL HIGH (ref 70–99)
HCT: 28 % — ABNORMAL LOW (ref 36.0–46.0)
Hemoglobin: 9.5 g/dL — ABNORMAL LOW (ref 12.0–15.0)
Potassium: 3.1 mmol/L — ABNORMAL LOW (ref 3.5–5.1)
Sodium: 144 mmol/L (ref 135–145)
TCO2: 27 mmol/L (ref 22–32)

## 2023-05-21 LAB — PROTIME-INR
INR: 1 (ref 0.8–1.2)
Prothrombin Time: 13.5 s (ref 11.4–15.2)

## 2023-05-21 LAB — DIFFERENTIAL
Abs Immature Granulocytes: 0.01 10*3/uL (ref 0.00–0.07)
Basophils Absolute: 0.1 10*3/uL (ref 0.0–0.1)
Basophils Relative: 1 %
Eosinophils Absolute: 0.1 10*3/uL (ref 0.0–0.5)
Eosinophils Relative: 2 %
Immature Granulocytes: 0 %
Lymphocytes Relative: 21 %
Lymphs Abs: 1.3 10*3/uL (ref 0.7–4.0)
Monocytes Absolute: 0.5 10*3/uL (ref 0.1–1.0)
Monocytes Relative: 8 %
Neutro Abs: 4.1 10*3/uL (ref 1.7–7.7)
Neutrophils Relative %: 68 %

## 2023-05-21 LAB — CBC
HCT: 30.1 % — ABNORMAL LOW (ref 36.0–46.0)
Hemoglobin: 9.3 g/dL — ABNORMAL LOW (ref 12.0–15.0)
MCH: 30.7 pg (ref 26.0–34.0)
MCHC: 30.9 g/dL (ref 30.0–36.0)
MCV: 99.3 fL (ref 80.0–100.0)
Platelets: 213 10*3/uL (ref 150–400)
RBC: 3.03 MIL/uL — ABNORMAL LOW (ref 3.87–5.11)
RDW: 14.5 % (ref 11.5–15.5)
WBC: 6.1 10*3/uL (ref 4.0–10.5)
nRBC: 0 % (ref 0.0–0.2)

## 2023-05-21 LAB — COMPREHENSIVE METABOLIC PANEL
ALT: 11 U/L (ref 0–44)
AST: 16 U/L (ref 15–41)
Albumin: 3.3 g/dL — ABNORMAL LOW (ref 3.5–5.0)
Alkaline Phosphatase: 60 U/L (ref 38–126)
Anion gap: 11 (ref 5–15)
BUN: 12 mg/dL (ref 8–23)
CO2: 26 mmol/L (ref 22–32)
Calcium: 8.7 mg/dL — ABNORMAL LOW (ref 8.9–10.3)
Chloride: 104 mmol/L (ref 98–111)
Creatinine, Ser: 1.17 mg/dL — ABNORMAL HIGH (ref 0.44–1.00)
GFR, Estimated: 44 mL/min — ABNORMAL LOW (ref >60)
Glucose, Bld: 131 mg/dL — ABNORMAL HIGH (ref 70–99)
Potassium: 3.1 mmol/L — ABNORMAL LOW (ref 3.5–5.1)
Sodium: 141 mmol/L (ref 135–145)
Total Bilirubin: 0.4 mg/dL (ref 0.3–1.2)
Total Protein: 6.1 g/dL — ABNORMAL LOW (ref 6.5–8.1)

## 2023-05-21 LAB — TSH: TSH: 19.892 u[IU]/mL — ABNORMAL HIGH (ref 0.350–4.500)

## 2023-05-21 LAB — ETHANOL: Alcohol, Ethyl (B): <10 mg/dL (ref <10)

## 2023-05-21 LAB — CBG MONITORING, ED: Glucose-Capillary: 123 mg/dL — ABNORMAL HIGH (ref 70–99)

## 2023-05-21 LAB — APTT: aPTT: 31 s (ref 24–36)

## 2023-05-21 MED ORDER — ENOXAPARIN SODIUM 40 MG/0.4ML IJ SOSY
40.0000 mg | PREFILLED_SYRINGE | INTRAMUSCULAR | Status: DC
  Administered 2023-05-22: 40 mg via SUBCUTANEOUS
  Filled 2023-05-21 (×2): qty 0.4

## 2023-05-21 MED ORDER — STROKE: EARLY STAGES OF RECOVERY BOOK
Freq: Once | Status: AC
  Filled 2023-05-21: qty 1

## 2023-05-21 MED ORDER — SODIUM CHLORIDE 0.9% FLUSH
3.0000 mL | Freq: Two times a day (BID) | INTRAVENOUS | Status: DC
  Administered 2023-05-21 – 2023-05-25 (×8): 3 mL via INTRAVENOUS

## 2023-05-21 MED ORDER — ACETAMINOPHEN 325 MG PO TABS: 650.0000 mg | ORAL_TABLET | Freq: Four times a day (QID) | ORAL | Status: DC | PRN

## 2023-05-21 MED ORDER — QUETIAPINE FUMARATE 50 MG PO TABS
50.0000 mg | ORAL_TABLET | Freq: Two times a day (BID) | ORAL | Status: DC
  Administered 2023-05-21 – 2023-05-25 (×8): 50 mg via ORAL
  Filled 2023-05-21 (×8): qty 1

## 2023-05-21 MED ORDER — ACETAMINOPHEN 650 MG RE SUPP: 650.0000 mg | Freq: Four times a day (QID) | RECTAL | Status: DC | PRN

## 2023-05-21 MED ORDER — POLYETHYLENE GLYCOL 3350 17 G PO PACK: 17.0000 g | PACK | Freq: Every day | ORAL | Status: DC | PRN

## 2023-05-21 MED ORDER — FERROUS SULFATE 325 (65 FE) MG PO TABS
324.0000 mg | ORAL_TABLET | Freq: Every day | ORAL | Status: DC
  Administered 2023-05-22 – 2023-05-23 (×2): 324 mg via ORAL
  Filled 2023-05-21 (×3): qty 1

## 2023-05-21 MED ORDER — AMLODIPINE BESYLATE 5 MG PO TABS: 5.0000 mg | ORAL_TABLET | Freq: Every day | ORAL | Status: DC

## 2023-05-21 MED ORDER — DIVALPROEX SODIUM 125 MG PO CSDR
125.0000 mg | DELAYED_RELEASE_CAPSULE | Freq: Two times a day (BID) | ORAL | Status: DC
  Administered 2023-05-21 – 2023-05-25 (×8): 125 mg via ORAL
  Filled 2023-05-21 (×8): qty 1

## 2023-05-21 MED ORDER — MEMANTINE HCL 10 MG PO TABS
10.0000 mg | ORAL_TABLET | Freq: Every day | ORAL | Status: DC
  Administered 2023-05-21 – 2023-05-24 (×4): 10 mg via ORAL
  Filled 2023-05-21 (×4): qty 1

## 2023-05-21 MED ORDER — IRBESARTAN 150 MG PO TABS
75.0000 mg | ORAL_TABLET | Freq: Every day | ORAL | Status: DC
  Administered 2023-05-22 – 2023-05-25 (×4): 75 mg via ORAL
  Filled 2023-05-21 (×4): qty 1

## 2023-05-21 MED ORDER — HYDRALAZINE HCL 20 MG/ML IJ SOLN
10.0000 mg | INTRAMUSCULAR | Status: DC | PRN
  Administered 2023-05-21 – 2023-05-22 (×3): 10 mg via INTRAVENOUS
  Filled 2023-05-21 (×3): qty 1

## 2023-05-21 MED ORDER — PANTOPRAZOLE SODIUM 40 MG PO TBEC
40.0000 mg | DELAYED_RELEASE_TABLET | Freq: Two times a day (BID) | ORAL | Status: DC
  Administered 2023-05-21 – 2023-05-25 (×8): 40 mg via ORAL
  Filled 2023-05-21 (×8): qty 1

## 2023-05-21 MED ORDER — CALCIUM GLUCONATE-NACL 1-0.675 GM/50ML-% IV SOLN
1.0000 g | Freq: Once | INTRAVENOUS | Status: AC
  Administered 2023-05-21: 1000 mg via INTRAVENOUS
  Filled 2023-05-21: qty 50

## 2023-05-21 MED ORDER — ALBUTEROL SULFATE (2.5 MG/3ML) 0.083% IN NEBU: 2.5000 mg | INHALATION_SOLUTION | Freq: Four times a day (QID) | RESPIRATORY_TRACT | Status: DC | PRN

## 2023-05-21 MED ORDER — IOHEXOL 350 MG/ML SOLN: 75.0000 mL | Freq: Once | INTRAVENOUS | Status: DC | PRN

## 2023-05-21 MED ORDER — LEVOTHYROXINE SODIUM 75 MCG PO TABS
150.0000 ug | ORAL_TABLET | Freq: Every day | ORAL | Status: DC
  Administered 2023-05-22 – 2023-05-25 (×4): 150 ug via ORAL
  Filled 2023-05-21 (×4): qty 2

## 2023-05-21 MED ORDER — DONEPEZIL HCL 10 MG PO TABS
10.0000 mg | ORAL_TABLET | Freq: Every day | ORAL | Status: DC
  Administered 2023-05-21 – 2023-05-24 (×4): 10 mg via ORAL
  Filled 2023-05-21 (×4): qty 1

## 2023-05-21 MED ORDER — POLYVINYL ALCOHOL 1.4 % OP SOLN: 1.0000 [drp] | Freq: Three times a day (TID) | OPHTHALMIC | Status: DC | PRN

## 2023-05-21 MED ORDER — IOHEXOL 350 MG/ML SOLN
75.0000 mL | Freq: Once | INTRAVENOUS | Status: AC | PRN
  Administered 2023-05-21: 75 mL via INTRAVENOUS

## 2023-05-21 MED ORDER — SERTRALINE HCL 100 MG PO TABS
100.0000 mg | ORAL_TABLET | Freq: Two times a day (BID) | ORAL | Status: DC
  Administered 2023-05-21 – 2023-05-25 (×8): 100 mg via ORAL
  Filled 2023-05-21 (×8): qty 1

## 2023-05-21 NOTE — ED Notes (Signed)
Patient transported to MRI 

## 2023-05-21 NOTE — H&P (Addendum)
History and Physical    Patient: [name] YQM:[id number] DOB: 02/07/1929 DOA: 05/21/2023 DOS: the patient was seen and examined on 05/21/2023 PCP: Shirline Frees, NP  Patient coming from: SNF via EMS  Chief Complaint:  Chief Complaint  Patient presents with   Code Stroke   HPI: [name] is a 89 y.o. female with medical history significant of hypertension, hypothyroidism, Lewy body dementia who presented after being found slumped in her wheelchair and unable to speak.  History is obtainable mostly from her son present at bedside.  She had seen in the emergency department 10/10 after having a fall at the nursing facility.  It was reported that she hit her head and had a superficial abrasion on the left elbow.  CT scan of the head and cervical spine did not note any acute abnormality.  Patient was discharged back to the nursing facility in her normal state of health.  Son makes note that yesterday her blood pressures were elevated and to the 200s was also made them concerned prior to sending her to the hospital.  It was reported that patient had been in her normal state of health up until around 8:30 AM.  At some point she was noted to be unable to walk and was placed in a wheelchair.  Son notes that he spoke to her at around 9:30 AM and reminded her of her head appointment that he was coming to pick her up at around noon, and states that she sounded normal over the phone.  However, when he came to pick her up 12:10 PM found her lethargic like she was sleeping and was unable to talk once they were able to wake her up.  Normally patient is able to carry on a conversation and because she had recently fallen it was recommended that she come back to the hospital for reevaluation.   In the emergency department as a code stroke.  Patient was noted to be afebrile, pulse 50-61, respirations 13-24, blood pressures elevated up to 218/54, and O2 saturation maintained on room air.  CT imaging of the  head was performed and did not note any acute abnormality.  Patient was not thought to be a candidate for thrombolytics due to possibly being outside the window.  CT angiogram of the head noted no large vessel occlusion.  Labs significant for hemoglobin 9.3, potassium 3.1, BUN 12, creatinine 1.17, calcium 8.7, ionized calcium 1.13, and INR 1.  Neurology had recommended admission for MRI and EEG monitoring.   Review of Systems: As mentioned in the history of present illness. All other systems reviewed and are negative. Past Medical History:  Diagnosis Date   Anemia    Chronic fatigue    Chronic renal insufficiency, stage III (moderate) (HCC)    Depression    HYPERTENSION 04/04/2007   HYPOTHYROIDISM 04/04/2007   Lewy body dementia (HCC)    Macular degeneration    Past Surgical History:  Procedure Laterality Date   ABDOMINAL HYSTERECTOMY     APPENDECTOMY     BACK SURGERY  2012   Cataracts     CHOLECYSTECTOMY     ERCP N/A 11/22/2014   Procedure: ENDOSCOPIC RETROGRADE CHOLANGIOPANCREATOGRAPHY (ERCP);  Surgeon: Vida Rigger, MD;  Location: [facility] ENDOSCOPY;  Service: Endoscopy;  Laterality: N/A;   HERNIA REPAIR     OOPHORECTOMY     ORIF TIBIA FRACTURE  2012   dr Curt Bears SURGERY  2011   cervical   Social History:  reports that  she quit smoking about 53 years ago. Her smoking use included cigarettes. She started smoking about 60 years ago. She has a 7 pack-year smoking history. She has never used smokeless tobacco. She reports that she does not drink alcohol and does not use drugs.  Allergies  Allergen Reactions   Oxycodone Hcl Itching    Family History  Problem Relation Age of Onset   Kidney cancer Mother 34       renal cell CA   Heart disease Father    Lymphoma Son    Breast cancer Daughter     Prior to Admission medications   Medication Sig Start Date End Date Taking? Authorizing Provider  amLODipine (NORVASC) 10 MG tablet TAKE 1 TABLET BY MOUTH DAILY Patient taking  differently: Take 5 mg by mouth daily. 06/23/22  Yes Nafziger, Kandee Keen, NP  Brexpiprazole (REXULTI) 0.5 MG TABS TAKE 1 TABLET BY MOUTH AT NIGHT 05/11/23  Yes Van Clines, MD  divalproex (DEPAKOTE SPRINKLE) 125 MG capsule Take 125 mg by mouth 2 (two) times daily. 04/29/23  Yes [provider]  docusate sodium (COLACE) 100 MG capsule Take 1 capsule (100 mg total) by mouth 2 (two) times daily. Patient taking differently: Take 100 mg by mouth 2 (two) times daily as needed for mild constipation. 12/11/22  Yes Hongalgi, Maximino Greenland, MD  donepezil (ARICEPT) 10 MG tablet Take 1 tablet (10 mg total) by mouth at bedtime. 12/11/22  Yes Hongalgi, Maximino Greenland, MD  ferrous sulfate 324 MG TBEC Take 324 mg by mouth daily.   Yes [provider]  hydrocortisone (ANUSOL-HC) 25 MG suppository Place 25 mg rectally 2 (two) times daily. 04/27/23  Yes [provider]  levothyroxine (SYNTHROID) 150 MCG tablet Take 1 tablet (150 mcg total) by mouth daily before breakfast. 12/11/22  Yes Hongalgi, Maximino Greenland, MD  memantine (NAMENDA) 10 MG tablet TAKE 1 TABLET BY MOUTH IN THE  MORNING AND AT BEDTIME Patient taking differently: Take 10 mg by mouth at bedtime. 12/07/22  Yes Van Clines, MD  Multiple Vitamins-Minerals (PRESERVISION AREDS 2 PO) Take 1 capsule by mouth daily with breakfast.   Yes [provider]  olmesartan (BENICAR) 20 MG tablet Take 0.5 tablets (10 mg total) by mouth daily. 12/11/22  Yes Hongalgi, Maximino Greenland, MD  omeprazole (PRILOSEC) 40 MG capsule TAKE 1 CAPSULE BY MOUTH DAILY 06/23/22  Yes Nafziger, Kandee Keen, NP  ondansetron (ZOFRAN-ODT) 4 MG disintegrating tablet Take 4 mg by mouth every 8 (eight) hours as needed for nausea. 01/06/23  Yes [provider]  pantoprazole (PROTONIX) 40 MG tablet Take 40 mg by mouth 2 (two) times daily. 05/16/23  Yes [provider]  polyethylene glycol (MIRALAX / GLYCOLAX) 17 g packet Take 17 g by mouth daily as needed for mild constipation. 12/11/22  Yes  Hongalgi, Maximino Greenland, MD  polyvinyl alcohol (ARTIFICIAL TEARS) 1.4 % ophthalmic solution Place 1 drop into both eyes 3 (three) times daily as needed for dry eyes.   Yes [provider]  QUEtiapine (SEROQUEL) 50 MG tablet TAKE 1 TABLET BY MOUTH TWICE  DAILY 02/01/23  Yes Van Clines, MD  sertraline (ZOLOFT) 100 MG tablet Take 1 tablet (100 mg total) by mouth in the morning and at bedtime. 12/11/22  Yes Hongalgi, Maximino Greenland, MD  TYLENOL 500 MG tablet Take 1,000 mg by mouth in the morning and at bedtime.   Yes [provider]    Physical Exam: Vitals:   05/21/23 1336 05/21/23 1345 05/21/23 1400 05/21/23  1430  BP: (!) 196/55 (!) 125/113 (!) 166/117 (!) 181/58  Pulse:  61 (!) 50 (!) 51  Resp:  (!) 24 16 13   Temp:  97.8 F (36.6 C)    TempSrc:  Axillary    SpO2:  94% 96% 95%  Weight:       Constitutional: Elderly female currently in no acute distress. Eyes: PERRL, lids and conjunctivae normal ENMT: Mucous membranes are moist.  Hard of hearing. Neck: normal, supple, no JVD. Respiratory: clear to auscultation bilaterally, no wheezing, no crackles. Normal respiratory effort. No accessory muscle use.  Cardiovascular: Regular rate and rhythm, no murmurs / rubs / gallops. No extremity edema. 2+ pedal pulses.   Abdomen: Mild tenderness palpation of the lower abdomen.  No masses palpated. No hepatosplenomegaly. Bowel sounds positive.  Musculoskeletal: no clubbing / cyanosis. No joint deformity upper and lower extremities. Good ROM, no contractures. Normal muscle tone.  Skin: Bruising noted on the left arm with bandage to the left elbow. Neurologic: CN 2-12 grossly intact.  Strength appears to be 4+/5 in bilateral upper extremities.  Strength 3/5 in the lower extremities.  Dysarthria present. Psychiatric: Alert and oriented to  Data Reviewed:  EKG reveals sinus rhythm at 58 bpm with short PR interval and QT 492.  Reviewed labs, imaging, and pertinent records.  Assessment and  Plan:  Dysarthria and weakness  Acute.  Patient presented after being noted to have weakness and dysarthria.  Normally patient is able to ambulate and communicate without issue.  However today was weak to stand and was placed on wheelchair for staff.  This afternoon was noted to be unable to speak temporarily, but still seems to have difficulty with her speech.  CT scan of the head did not note any acute abnormality.  Patient was not a candidate for thrombolytics.   Subsequently, CT angiogram of the head did not note any large vessel occlusion.  Unclear cause questioning TIA/stroke, seizure, infection,  uncontrolled blood pressures, or possibly progression of dementia. -Admit to a telemetry bed -Neurochecks -Follow-up EEG -Follow-up MRI of the -Follow-up urinalysis -PT/OT to evaluate and treat -Appreciate neurology consultative services we will follow-up for any further recommendations  Hypertensive urgency Blood pressures were noted to be elevated up to 220/74.  Blood pressure regimen includes amlodipine 5 mg daily and olmesartan 10 mg daily. -Continue pharmacy substitution for blood pressure -Hydralazine IV as needed  Hypokalemia Hypocalcemia Acute.  On admission potassium was 3.1 and calcium 8.7 with ionized calcium 1.13. -Give potassium chloride 40 meq -Calcium gluconate 1 g IV -Continue to monitor and replace as needed  Dementia Home medication regimen includes Aricept 10 mg nightly and Namenda 10 mg nightly.  Also on Seroquel and Depakote -Delirium precautions -Continue current medication regimen  Hypothyroidism TSH was last noted to be 5.128 on 12/10/2022. -Check TSH -Continue levothyroxine  Anemia of chronic disease Hemoglobin 9.3 g/dL which appears around patient's baseline.   -Continue to monitor  Chronic kidney disease stage IIIb Creatinine 1.17 which appears slightly lower than priors. -Continue to monitor  Anxiety and depression -Continue  sertraline  GERD -Continue Protonix  DVT prophylaxis: Lovenox Advance Care Planning:   Code Status: Limited: Do not attempt resuscitation (DNR) -DNR-LIMITED -Do Not Intubate/DNI    Consults: Neurology  Family Communication: Son updated at bedside  Severity of Illness: The appropriate patient status for this patient is OBSERVATION. Observation status is judged to be reasonable and necessary in order to provide the required intensity of service to ensure the patient's  safety. The patient's presenting symptoms, physical exam findings, and initial radiographic and laboratory data in the context of their medical condition is felt to place them at decreased risk for further clinical deterioration. Furthermore, it is anticipated that the patient will be medically stable for discharge from the hospital within 2 midnights of admission.   Author: Clydie Braun, MD 05/21/2023 3:02 PM  For on call review [email or web address].

## 2023-05-21 NOTE — Progress Notes (Signed)
EEG complete - results pending 

## 2023-05-21 NOTE — Code Documentation (Signed)
Stroke Response Nurse Documentation Code Documentation  [name] is a 89 y.o. female arriving to [facility]  via [facility] on 10/11 with past medical hx of dementia, htn, CKD. On No antithrombotic. Code stroke was activated by ED.   Patient from [facility] where she was LKW at 0830 and now complaining of aphasia and generalized weakness. Patient was put in the wheelchair this morning at her baseline and then when staff tried to get her back to bed she was too weak to get out of the wheelchair and was aphasic.    Stroke team at the bedside on patient arrival. Labs drawn and patient cleared for CT by Dr. Posey Rea. Patient to CT with team. NIHSS 8, see documentation for details and code stroke times. Patient with disoriented, bilateral leg weakness, Expressive aphasia , and dysarthria  on exam. The following imaging was completed:  CT Head and CTA. Patient is not a candidate for IV Thrombolytic due to being out of the treatment window. Patient is not not a candidate for IR due to no LVO on imaging per MD.   Care Plan: q2 hr NIH, MRI, EEG.   Bedside handoff with ED RN Elexes.    Pearlie Oyster  Stroke Response RN

## 2023-05-21 NOTE — ED Provider Notes (Signed)
[place] EMERGENCY DEPARTMENT AT [facility] Provider Note  CSN: [id number] Arrival date & time: 05/21/23 1314  Chief Complaint(s) No chief complaint on file.  [name] is a 89 y.o. female with PMH Lewy body dementia, CKD 3, hypothyroidism who presents as a code stroke for acute onset speech disturbance, generalized weakness and altered mental status.  Last known well 8:30 AM.  History obtained from patient's son who states that he spoke to her this morning but when he went to visit her in the nursing home around noon she was slumped over in her chair, unable to walk and very confused.  Difficulty with speech.  Patient unable to provide any additional history given current altered mental status   Past Medical History Past Medical History:  Diagnosis Date   Anemia    Chronic fatigue    Chronic renal insufficiency, stage III (moderate) (HCC)    Depression    HYPERTENSION 04/04/2007   HYPOTHYROIDISM 04/04/2007   Lewy body dementia (HCC)    Macular degeneration    Patient Active Problem List   Diagnosis Date Noted   Fall 12/10/2022   Right sided weakness 05/04/2022   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   CKD (chronic kidney disease) stage 3, GFR 30-59 ml/min (HCC) 03/31/2019   Anemia 03/07/2015   Transaminitis 11/20/2014   Hyponatremia 10/09/2014   Lightheaded 08/30/2014   Right hip pain 07/25/2014   Chronic neck pain 06/06/2014   Memory loss 06/23/2013   Right shoulder pain 06/23/2013   Eczema 01/16/2013   Leg edema 08/23/2012   Chronic insomnia 08/09/2012   Renal insufficiency 03/31/2011   CYSTOCELE WITHOUT MENTION UTERINE PROLAPSE LAT 08/29/2010   Anemia of chronic disease 05/06/2010   LIVER FUNCTION TESTS, ABNORMAL, HX OF 01/14/2009   Hypothyroidism 04/04/2007   Adjustment disorder with mixed anxiety and depressed mood 04/04/2007   Essential hypertension 04/04/2007   Home Medication(s) Prior to Admission medications   Medication Sig Start Date End Date Taking?  Authorizing Provider  amLODipine (NORVASC) 10 MG tablet TAKE 1 TABLET BY MOUTH DAILY Patient taking differently: Take 10 mg by mouth daily. 06/23/22   Nafziger, Kandee Keen, NP  Brexpiprazole (REXULTI) 0.5 MG TABS TAKE 1 TABLET BY MOUTH AT NIGHT 05/11/23   Van Clines, MD  diphenhydramine-acetaminophen (TYLENOL PM) 25-500 MG TABS tablet Take 1 tablet by mouth at bedtime as needed (for pain/sleep).    [provider]  docusate sodium (COLACE) 100 MG capsule Take 1 capsule (100 mg total) by mouth 2 (two) times daily. 12/11/22   Hongalgi, Maximino Greenland, MD  donepezil (ARICEPT) 10 MG tablet Take 1 tablet (10 mg total) by mouth at bedtime. 12/11/22   Hongalgi, Maximino Greenland, MD  levothyroxine (SYNTHROID) 150 MCG tablet Take 1 tablet (150 mcg total) by mouth daily before breakfast. 12/11/22   Hongalgi, Maximino Greenland, MD  memantine (NAMENDA) 10 MG tablet TAKE 1 TABLET BY MOUTH IN THE  MORNING AND AT BEDTIME Patient taking differently: Take 10 mg by mouth 2 (two) times daily. 12/07/22   Van Clines, MD  Multiple Vitamins-Minerals (ONE-A-DAY WOMENS 50+) TABS Take 1 tablet by mouth daily with breakfast.    [provider]  Multiple Vitamins-Minerals (PRESERVISION AREDS 2 PO) Take 1 capsule by mouth daily with breakfast.    [provider]  olmesartan (BENICAR) 20 MG tablet Take 0.5 tablets (10 mg total) by mouth daily. 12/11/22   Hongalgi, Maximino Greenland, MD  omeprazole (PRILOSEC) 40 MG capsule TAKE 1 CAPSULE BY MOUTH DAILY  Patient taking differently: in the morning and at bedtime. 06/23/22   Nafziger, Kandee Keen, NP  polyethylene glycol (MIRALAX / GLYCOLAX) 17 g packet Take 17 g by mouth daily as needed for mild constipation. 12/11/22   Hongalgi, Maximino Greenland, MD  QUEtiapine (SEROQUEL) 50 MG tablet TAKE 1 TABLET BY MOUTH TWICE  DAILY 02/01/23   Van Clines, MD  sertraline (ZOLOFT) 100 MG tablet Take 1 tablet (100 mg total) by mouth in the morning and at bedtime. 12/11/22   Hongalgi, Maximino Greenland, MD  SYSTANE ULTRA PF 0.4-0.3 %  SOLN Place 1 drop into both eyes 3 (three) times daily as needed (for dryness).    [provider]  TYLENOL 500 MG tablet Take 500 mg by mouth every 6 (six) hours as needed for mild pain or headache.    [provider]                                                                                                                                    Past Surgical History Past Surgical History:  Procedure Laterality Date   ABDOMINAL HYSTERECTOMY     APPENDECTOMY     BACK SURGERY  2012   Cataracts     CHOLECYSTECTOMY     ERCP N/A 11/22/2014   Procedure: ENDOSCOPIC RETROGRADE CHOLANGIOPANCREATOGRAPHY (ERCP);  Surgeon: Vida Rigger, MD;  Location: [facility] ENDOSCOPY;  Service: Endoscopy;  Laterality: N/A;   HERNIA REPAIR     OOPHORECTOMY     ORIF TIBIA FRACTURE  2012   dr Lajoyce Corners   SPINE SURGERY  2011   cervical   Family History Family History  Problem Relation Age of Onset   Kidney cancer Mother 47       renal cell CA   Heart disease Father    Lymphoma Son    Breast cancer Daughter     Social History Social History   Tobacco Use   Smoking status: Former    Current packs/day: 0.00    Average packs/day: 1 pack/day for 7.0 years (7.0 ttl pk-yrs)    Types: Cigarettes    Start date: 10/14/1962    Quit date: 10/13/1969    Years since quitting: 53.6   Smokeless tobacco: Never  Vaping Use   Vaping status: Never Used  Substance Use Topics   Alcohol use: No   Drug use: No   Allergies Oxycodone hcl  Review of Systems Review of Systems  Unable to perform ROS: Mental status change    Physical Exam Vital Signs  I have reviewed the triage vital signs BP (!) 166/117   Pulse (!) 50   Temp 97.8 F (36.6 C) (Axillary)   Resp 16   Wt 66 kg   SpO2 96%   BMI 26.61 kg/m   Physical Exam Vitals and nursing note reviewed.  Constitutional:      General: She is not in acute distress.    Appearance: She is  well-developed.  HENT:     Head: Normocephalic and atraumatic.   Eyes:     Conjunctiva/sclera: Conjunctivae normal.  Cardiovascular:     Rate and Rhythm: Normal rate and regular rhythm.     Heart sounds: No murmur heard. Pulmonary:     Effort: Pulmonary effort is normal. No respiratory distress.     Breath sounds: Normal breath sounds.  Abdominal:     Palpations: Abdomen is soft.     Tenderness: There is no abdominal tenderness.  Musculoskeletal:        General: No swelling.     Cervical back: Neck supple.  Skin:    General: Skin is warm and dry.     Capillary Refill: Capillary refill takes less than 2 seconds.  Neurological:     Mental Status: She is alert. She is disoriented.  Psychiatric:        Mood and Affect: Mood normal.     ED Results and Treatments Labs (all labs ordered are listed, but only abnormal results are displayed) Labs Reviewed  CBC - Abnormal; Notable for the following components:      Result Value   RBC 3.03 (*)    Hemoglobin 9.3 (*)    HCT 30.1 (*)    All other components within normal limits  CBG MONITORING, ED - Abnormal; Notable for the following components:   Glucose-Capillary 123 (*)    All other components within normal limits  I-STAT CHEM 8, ED - Abnormal; Notable for the following components:   Potassium 3.1 (*)    Creatinine, Ser 1.40 (*)    Glucose, Bld 132 (*)    Calcium, Ion 1.13 (*)    Hemoglobin 9.5 (*)    HCT 28.0 (*)    All other components within normal limits  PROTIME-INR  APTT  DIFFERENTIAL  ETHANOL  COMPREHENSIVE METABOLIC PANEL  RAPID URINE DRUG SCREEN, HOSP PERFORMED  URINALYSIS, ROUTINE W REFLEX MICROSCOPIC                                                                                                                          Radiology CT ANGIO HEAD NECK W WO CM (CODE STROKE)  Result Date: 05/21/2023 CLINICAL DATA:  Neuro deficit, acute, stroke suspected EXAM: CT ANGIOGRAPHY HEAD AND NECK WITH AND WITHOUT CONTRAST TECHNIQUE: Multidetector CT imaging of the head and neck was  performed using the standard protocol during bolus administration of intravenous contrast. Multiplanar CT image reconstructions and MIPs were obtained to evaluate the vascular anatomy. Carotid stenosis measurements (when applicable) are obtained utilizing NASCET criteria, using the distal internal carotid diameter as the denominator. RADIATION DOSE REDUCTION: This exam was performed according to the departmental dose-optimization program which includes automated exposure control, adjustment of the mA and/or kV according to patient size and/or use of iterative reconstruction technique. CONTRAST:  75mL OMNIPAQUE IOHEXOL 350 MG/ML SOLN COMPARISON:  None Available. FINDINGS: CTA NECK FINDINGS Aortic arch: Great vessel origins are patent. Atherosclerosis of the aorta and  great vessel origins. Right carotid system: Atherosclerosis at the carotid bifurcation without greater than 50% stenosis. Left carotid system: Atherosclerosis at the carotid bifurcation involving the proximal ICA without greater than 50% stenosis. Vertebral arteries: Moderate bilateral vertebral artery origin stenosis. Moderate stenosis of the distal left V2 vertebral artery in a region of tortuosity. Skeleton: No acute abnormality on limited assessment. Multilevel ACDF. Other neck: No acute abnormality on limited assessment. Upper chest: Visualized lung apices are clear. Review of the MIP images confirms the above findings CTA HEAD FINDINGS Anterior circulation: Bilateral intracranial ICAs, MCAs, and ACAs are patent without proximal hemodynamically significant stenosis. Posterior circulation: Left lateral intradural vertebral arteries, basilar artery and bilateral posterior cerebral arteries are patent without proximal hemodynamically significant stenosis. Venous sinuses: As permitted by contrast timing, patent. Review of the MIP images confirms the above findings IMPRESSION: 1. No emergent large vessel occlusion. 2. Moderate stenosis of bilateral  vertebral artery origins and the left V2 vertebral artery. 3.  Aortic Atherosclerosis (ICD10-I70.0). Electronically Signed   By: Feliberto Harts M.D.   On: 05/21/2023 14:04   CT HEAD CODE STROKE WO CONTRAST  Result Date: 05/21/2023 CLINICAL DATA:  Code stroke.  Neuro deficit, acute, stroke suspected EXAM: CT HEAD WITHOUT CONTRAST TECHNIQUE: Contiguous axial images were obtained from the base of the skull through the vertex without intravenous contrast. RADIATION DOSE REDUCTION: This exam was performed according to the departmental dose-optimization program which includes automated exposure control, adjustment of the mA and/or kV according to patient size and/or use of iterative reconstruction technique. COMPARISON:  CT Head 05/20/23 FINDINGS: Brain: No evidence of acute infarction, hemorrhage, hydrocephalus, extra-axial collection or mass lesion/mass effect. Sequela of moderate chronic microvascular ischemic change. Vascular: No hyperdense vessel or unexpected calcification. Skull: Normal. Negative for fracture or focal lesion. Sinuses/Orbits: No middle ear or mastoid effusion. Paranasal sinuses are clear. Bilateral lens replacement. Orbits are otherwise unremarkable. Other: None. ASPECTS ([place] Stroke Program Early CT Score): 10 IMPRESSION: 1. No hemorrhage or CT evidence of an acute cortical infarct. ASPECTS is 10. 2. Sequela of moderate chronic microvascular ischemic change. Findings were paged to Dr. Derry Lory on 05/21/2023 at 1:45 p.m. via [facility] paging system. Electronically Signed   By: Lorenza Cambridge M.D.   On: 05/21/2023 13:45   DG Pelvis 1-2 Views  Result Date: 05/20/2023 CLINICAL DATA:  Fall EXAM: PELVIS - 1-2 VIEW COMPARISON:  CT 12/09/2022 FINDINGS: SI joints are non widened. Pubic symphysis appears intact. Questionable right superior pubic ramus fracture. The femoral heads project in joint. IMPRESSION: Questionable right superior pubic ramus fracture. Electronically Signed   By: Jasmine Pang M.D.   On: 05/20/2023 21:06   DG Elbow 2 Views Left  Result Date: 05/20/2023 CLINICAL DATA:  Fall with bruising EXAM: LEFT ELBOW - 2 VIEW COMPARISON:  12/10/2022 FINDINGS: There is no evidence of fracture, dislocation, or joint effusion. There is no evidence of arthropathy or other focal bone abnormality. Soft tissues are unremarkable. IMPRESSION: Negative. Electronically Signed   By: Jasmine Pang M.D.   On: 05/20/2023 21:04   DG Chest Portable 1 View  Result Date: 05/20/2023 CLINICAL DATA:  Fall EXAM: PORTABLE CHEST 1 VIEW COMPARISON:  CT 12/09/2022, chest x-ray 12/17/2016 FINDINGS: Hardware in the cervical spine. Mildly diminished lung volume. Cardiac enlargement. Aortic atherosclerosis. Upper normal mediastinal with likely augmented by portable technique and low lung volume. No acute airspace disease, pleural effusion, or pneumothorax IMPRESSION: Low lung volumes.  Cardiomegaly. Electronically Signed   By: Adrian Prows.D.  On: 05/20/2023 21:03   CT Head Wo Contrast  Result Date: 05/20/2023 CLINICAL DATA:  Patient fell and hit head. Skin tear to left elbow and bruising to upper arm. Dementia. EXAM: CT HEAD WITHOUT CONTRAST TECHNIQUE: Contiguous axial images were obtained from the base of the skull through the vertex without intravenous contrast. RADIATION DOSE REDUCTION: This exam was performed according to the departmental dose-optimization program which includes automated exposure control, adjustment of the mA and/or kV according to patient size and/or use of iterative reconstruction technique. COMPARISON:  CT head and cervical spine 12/09/2022 FINDINGS: CT HEAD FINDINGS Brain: No intracranial hemorrhage, mass effect, or evidence of acute infarct. No hydrocephalus. No extra-axial fluid collection. Age related cerebral atrophy and chronic small vessel ischemic disease. Vascular: No hyperdense vessel. Intracranial arterial calcification. Skull: No fracture or focal lesion.  Sinuses/Orbits: No acute finding. Other: None. CT CERVICAL SPINE FINDINGS Alignment: No evidence of traumatic malalignment. Skull base and vertebrae: No acute fracture. No primary bone lesion or focal pathologic process. Soft tissues and spinal canal: No prevertebral fluid or swelling. No visible canal hematoma. Disc levels: ACDF C4-C5 and C6-C7. No significant spinal canal. Multilevel facet arthropathy. Upper chest: No acute abnormality. Other: Carotid calcification. IMPRESSION: 1. No acute intracranial abnormality. 2. No acute fracture in the cervical spine. Electronically Signed   By: Minerva Fester M.D.   On: 05/20/2023 21:00   CT Cervical Spine Wo Contrast  Result Date: 05/20/2023 CLINICAL DATA:  Patient fell and hit head. Skin tear to left elbow and bruising to upper arm. Dementia. EXAM: CT HEAD WITHOUT CONTRAST TECHNIQUE: Contiguous axial images were obtained from the base of the skull through the vertex without intravenous contrast. RADIATION DOSE REDUCTION: This exam was performed according to the departmental dose-optimization program which includes automated exposure control, adjustment of the mA and/or kV according to patient size and/or use of iterative reconstruction technique. COMPARISON:  CT head and cervical spine 12/09/2022 FINDINGS: CT HEAD FINDINGS Brain: No intracranial hemorrhage, mass effect, or evidence of acute infarct. No hydrocephalus. No extra-axial fluid collection. Age related cerebral atrophy and chronic small vessel ischemic disease. Vascular: No hyperdense vessel. Intracranial arterial calcification. Skull: No fracture or focal lesion. Sinuses/Orbits: No acute finding. Other: None. CT CERVICAL SPINE FINDINGS Alignment: No evidence of traumatic malalignment. Skull base and vertebrae: No acute fracture. No primary bone lesion or focal pathologic process. Soft tissues and spinal canal: No prevertebral fluid or swelling. No visible canal hematoma. Disc levels: ACDF C4-C5 and C6-C7.  No significant spinal canal. Multilevel facet arthropathy. Upper chest: No acute abnormality. Other: Carotid calcification. IMPRESSION: 1. No acute intracranial abnormality. 2. No acute fracture in the cervical spine. Electronically Signed   By: Minerva Fester M.D.   On: 05/20/2023 21:00    Pertinent labs & imaging results that were available during my care of the patient were reviewed by me and considered in my medical decision making (see MDM for details).  Medications Ordered in ED Medications  iohexol (OMNIPAQUE) 350 MG/ML injection 75 mL (has no administration in time range)  iohexol (OMNIPAQUE) 350 MG/ML injection 75 mL (75 mLs Intravenous Contrast Given 05/21/23 1344)  Procedures .Critical Care  Performed by: Glendora Score, MD Authorized by: Glendora Score, MD   Critical care provider statement:    Critical care time (minutes):  30   Critical care was necessary to treat or prevent imminent or life-threatening deterioration of the following conditions:  CNS failure or compromise   Critical care was time spent personally by me on the following activities:  Development of treatment plan with patient or surrogate, discussions with consultants, evaluation of patient's response to treatment, examination of patient, ordering and review of laboratory studies, ordering and review of radiographic studies, ordering and performing treatments and interventions, pulse oximetry, re-evaluation of patient's condition and review of old charts   (including critical care time)  Medical Decision Making / ED Course   This patient presents to the ED for concern of altered mental status, this involves an extensive number of treatment options, and is a complaint that carries with it a high risk of complications and morbidity.  The differential diagnosis includes infection,  metabolic/toxic encephalopathy, hypoglycemia, malperfusion, hypoxia, trauma or other intracranial process, CVA, hemorrhagic stroke, mass, Todd's paralysis, seizure, electrolyte abnormality, encephalopathy, complicated migraine  MDM: Patient seen emergency room for evaluation of altered mental status.  Physical exam with expressive aphasia, weakness in bilateral upper and lower extremities.  Patient arrives as a stroke alert and was taken immediately to the CAT scanner for stroke imaging.  Patient was evaluated by both myself and the stroke neurologist Dr. Derry Lory.  TNK considered but patient does not meet criteria for TNK and thus deferred.  Laboratory valuation with a hemoglobin of 9.3, potassium 3.1 but is otherwise unremarkable.  Initial CT stroke imaging negative for acute stroke.  Neurology recommending MRI and EEG.  Patient not back to normal mental status baseline and will require hospital admission for further workup.  Patient admitted.   Additional history obtained: -Additional history obtained from son -External records from outside source obtained and reviewed including: Chart review including previous notes, labs, imaging, consultation notes   Lab Tests: -I ordered, reviewed, and interpreted labs.   The pertinent results include:   Labs Reviewed  CBC - Abnormal; Notable for the following components:      Result Value   RBC 3.03 (*)    Hemoglobin 9.3 (*)    HCT 30.1 (*)    All other components within normal limits  CBG MONITORING, ED - Abnormal; Notable for the following components:   Glucose-Capillary 123 (*)    All other components within normal limits  I-STAT CHEM 8, ED - Abnormal; Notable for the following components:   Potassium 3.1 (*)    Creatinine, Ser 1.40 (*)    Glucose, Bld 132 (*)    Calcium, Ion 1.13 (*)    Hemoglobin 9.5 (*)    HCT 28.0 (*)    All other components within normal limits  PROTIME-INR  APTT  DIFFERENTIAL  ETHANOL  COMPREHENSIVE METABOLIC  PANEL  RAPID URINE DRUG SCREEN, HOSP PERFORMED  URINALYSIS, ROUTINE W REFLEX MICROSCOPIC      EKG   EKG Interpretation Date/Time:  Friday May 21 2023 13:47:18 EDT Ventricular Rate:  58 PR Interval:  57 QRS Duration:  108 QT Interval:  500 QTC Calculation: 492 R Axis:   1  Text Interpretation: Sinus rhythm Confirmed by Scotland Korver ([id number]) on 05/21/2023 10:16:01 PM         Imaging Studies ordered: I ordered imaging studies including CT head, CT angio I independently visualized and interpreted imaging. I agree with the  radiologist interpretation  MRI brain pending   Medicines ordered and prescription drug management: Meds ordered this encounter  Medications   iohexol (OMNIPAQUE) 350 MG/ML injection 75 mL   iohexol (OMNIPAQUE) 350 MG/ML injection 75 mL    -I have reviewed the patients home medicines and have made adjustments as needed  Critical interventions Stroke alert with consideration of TNK  Consultations Obtained: I requested consultation with the stroke neurologist,  and discussed lab and imaging findings as well as pertinent plan - they recommend: MRI, EEG   Cardiac Monitoring: The patient was maintained on a cardiac monitor.  I personally viewed and interpreted the cardiac monitored which showed an underlying rhythm of: NSR  Social Determinants of Health:  Factors impacting patients care include: Lives in a skilled nursing facility   Reevaluation: After the interventions noted above, I reevaluated the patient and found that they have :stayed the same  Co morbidities that complicate the patient evaluation  Past Medical History:  Diagnosis Date   Anemia    Chronic fatigue    Chronic renal insufficiency, stage III (moderate) (HCC)    Depression    HYPERTENSION 04/04/2007   HYPOTHYROIDISM 04/04/2007   Lewy body dementia (HCC)    Macular degeneration       Dispostion: I considered admission for this patient, and given persistent altered  mental status patient require hospital admission     Final Clinical Impression(s) / ED Diagnoses Final diagnoses:  None     @PCDICTATION @    Glendora Score, MD 05/21/23 2216

## 2023-05-21 NOTE — Consult Note (Signed)
Neurology Consultation  Reason for Consult: Acute onset speech difficulty and weakness Referring Physician: Dr. Posey Rea  CC: None  History is obtained from: EMS and chart  HPI: [name] is a 89 y.o. female with history of dementia with Lewy bodies, hypothyroidism and chronic kidney disease stage III who presents with acute onset speech disturbance and generalized weakness.  Patient was in her usual state of health at her nursing home this morning until about 830, when she was unable to walk and had to get in a wheelchair.  She then developed an inability to speak normally.  Patient is reportedly normally very active at her skilled nursing facility typically ambulates around often.  She was seen in the emergency department yesterday for a fall in which she struck her head, but CT head at that time demonstrated no acute abnormality.   LKW: 0830 TNK given?: no, outside of window IR Thrombectomy? No, no LVO Modified Rankin Scale: 3-Moderate disability-requires help but walks WITHOUT assistance  ROS: Unable to obtain due to altered mental status.   Past Medical History:  Diagnosis Date   Anemia    Chronic fatigue    Chronic renal insufficiency, stage III (moderate) (HCC)    Depression    HYPERTENSION 04/04/2007   HYPOTHYROIDISM 04/04/2007   Lewy body dementia (HCC)    Macular degeneration      Family History  Problem Relation Age of Onset   Kidney cancer Mother 93       renal cell CA   Heart disease Father    Lymphoma Son    Breast cancer Daughter      Social History:   reports that she quit smoking about 53 years ago. Her smoking use included cigarettes. She started smoking about 60 years ago. She has a 7 pack-year smoking history. She has never used smokeless tobacco. She reports that she does not drink alcohol and does not use drugs.  Medications  Current Facility-Administered Medications:    iohexol (OMNIPAQUE) 350 MG/ML injection 75 mL, 75 mL, Intravenous, Once PRN,  Kommor, [place], MD  Current Outpatient Medications:    amLODipine (NORVASC) 10 MG tablet, TAKE 1 TABLET BY MOUTH DAILY (Patient taking differently: Take 10 mg by mouth daily.), Disp: 90 tablet, Rfl: 3   Brexpiprazole (REXULTI) 0.5 MG TABS, TAKE 1 TABLET BY MOUTH AT NIGHT, Disp: 90 tablet, Rfl: 1   diphenhydramine-acetaminophen (TYLENOL PM) 25-500 MG TABS tablet, Take 1 tablet by mouth at bedtime as needed (for pain/sleep)., Disp: , Rfl:    docusate sodium (COLACE) 100 MG capsule, Take 1 capsule (100 mg total) by mouth 2 (two) times daily., Disp: , Rfl:    donepezil (ARICEPT) 10 MG tablet, Take 1 tablet (10 mg total) by mouth at bedtime., Disp: , Rfl:    levothyroxine (SYNTHROID) 150 MCG tablet, Take 1 tablet (150 mcg total) by mouth daily before breakfast., Disp: , Rfl:    memantine (NAMENDA) 10 MG tablet, TAKE 1 TABLET BY MOUTH IN THE  MORNING AND AT BEDTIME (Patient taking differently: Take 10 mg by mouth 2 (two) times daily.), Disp: 180 tablet, Rfl: 2   Multiple Vitamins-Minerals (ONE-A-DAY WOMENS 50+) TABS, Take 1 tablet by mouth daily with breakfast., Disp: , Rfl:    Multiple Vitamins-Minerals (PRESERVISION AREDS 2 PO), Take 1 capsule by mouth daily with breakfast., Disp: , Rfl:    olmesartan (BENICAR) 20 MG tablet, Take 0.5 tablets (10 mg total) by mouth daily., Disp: , Rfl:    omeprazole (PRILOSEC) 40 MG capsule,  TAKE 1 CAPSULE BY MOUTH DAILY (Patient taking differently: in the morning and at bedtime.), Disp: 90 capsule, Rfl: 3   polyethylene glycol (MIRALAX / GLYCOLAX) 17 g packet, Take 17 g by mouth daily as needed for mild constipation., Disp: , Rfl:    QUEtiapine (SEROQUEL) 50 MG tablet, TAKE 1 TABLET BY MOUTH TWICE  DAILY, Disp: 180 tablet, Rfl: 1   sertraline (ZOLOFT) 100 MG tablet, Take 1 tablet (100 mg total) by mouth in the morning and at bedtime., Disp: , Rfl:    SYSTANE ULTRA PF 0.4-0.3 % SOLN, Place 1 drop into both eyes 3 (three) times daily as needed (for dryness)., Disp: ,  Rfl:    TYLENOL 500 MG tablet, Take 500 mg by mouth every 6 (six) hours as needed for mild pain or headache., Disp: , Rfl:    Exam: Current vital signs: BP (!) 125/113   Pulse 61   Temp 97.8 F (36.6 C) (Axillary)   Resp (!) 24   Wt 66 kg   SpO2 94%   BMI 26.61 kg/m  Vital signs in last 24 hours: Temp:  [97.8 F (36.6 C)-98.4 F (36.9 C)] 97.8 F (36.6 C) (10/11 1345) Pulse Rate:  [55-61] 61 (10/11 1345) Resp:  [18-24] 24 (10/11 1345) BP: (125-219)/(61-113) 125/113 (10/11 1345) SpO2:  [94 %-96 %] 94 % (10/11 1345) Weight:  [66 kg] 66 kg (10/11 1300)  GENERAL: Awake, alert, in no acute distress Psych: Affect appropriate for situation, patient is calm and cooperative with examination Head: Normocephalic and atraumatic, without obvious abnormality EENT: Normal conjunctivae, moist mucous membranes, no OP obstruction LUNGS: Normal respiratory effort. Non-labored breathing on room air CV: Regular rate and rhythm on telemetry Extremities: warm, well perfused, without obvious deformity  NEURO:  Mental Status: Awake and responsive but unable to answer most questions.  Does respond to name She is not able to provide a clear and coherent history of present illness. Speech/Language: speech is dysarthric, difficult to understand and in short words and phrases.   Patient is able to name some objects but not others No neglect is noted Cranial Nerves:  II: PERRL visual fields full.  III, IV, VI: EOMI. Lid elevation symmetric and full.  V: Sensation is intact to light touch and symmetrical to face.  VII: Face is symmetric resting and smiling.  VIII: Hearing intact to voice IX, X: Voice is dysarthric XII: Tongue protrudes midline without fasciculations.   Motor: 4/5 strength in bilateral upper extremities, 3/5 strength in bilateral lower extremities Sensation: Intact to light touch bilaterally in all four extremities.  Coordination: Unable to perform Gait: Deferred  NIHSS: 1a  Level of Conscious.: 0 1b LOC Questions: 2 1c LOC Commands: 0 2 Best Gaze: 0 3 Visual: 0 4 Facial Palsy: 0 5a Motor Arm - left: 0 5b Motor Arm - Right: 0 6a Motor Leg - Left: 2 6b Motor Leg - Right: 2 7 Limb Ataxia: 0 8 Sensory: 0 9 Best Language: 1 10 Dysarthria: 1 11 Extinct. and Inatten.: 0 TOTAL: 8   Labs I have reviewed labs in epic and the results pertinent to this consultation are:   CBC    Component Value Date/Time   WBC 6.1 05/21/2023 1321   RBC 3.03 (L) 05/21/2023 1321   HGB 9.5 (L) 05/21/2023 1325   HGB 10.4 (L) 11/11/2021 1635   HCT 28.0 (L) 05/21/2023 1325   PLT 213 05/21/2023 1321   PLT 281 11/11/2021 1635   MCV 99.3 05/21/2023 1321  MCH 30.7 05/21/2023 1321   MCHC 30.9 05/21/2023 1321   RDW 14.5 05/21/2023 1321   LYMPHSABS 1.3 05/21/2023 1321   MONOABS 0.5 05/21/2023 1321   EOSABS 0.1 05/21/2023 1321   BASOSABS 0.1 05/21/2023 1321    CMP     Component Value Date/Time   NA 144 05/21/2023 1325   K 3.1 (L) 05/21/2023 1325   CL 104 05/21/2023 1325   CO2 22 12/10/2022 0252   GLUCOSE 132 (H) 05/21/2023 1325   BUN 12 05/21/2023 1325   CREATININE 1.40 (H) 05/21/2023 1325   CREATININE 1.38 (H) 11/11/2021 1635   CALCIUM 8.4 (L) 12/10/2022 0252   PROT 7.2 09/22/2022 1503   ALBUMIN 4.4 09/22/2022 1503   ALBUMIN Not Applicable 01/31/2013 1003   AST 17 09/22/2022 1503   AST 12 (L) 11/11/2021 1635   ALT 14 09/22/2022 1503   ALT 16 11/11/2021 1635   ALKPHOS 82 09/22/2022 1503   BILITOT 0.3 09/22/2022 1503   BILITOT 0.4 11/11/2021 1635   GFRNONAA 33 (L) 12/10/2022 0252   GFRNONAA 36 (L) 11/11/2021 1635   GFRAA 43 (L) 11/23/2014 0504    Lipid Panel     Component Value Date/Time   CHOL 173 03/31/2019 0956   TRIG 91.0 03/31/2019 0956   HDL 44.50 03/31/2019 0956   CHOLHDL 4 03/31/2019 0956   VLDL 18.2 03/31/2019 0956   LDLCALC 110 (H) 03/31/2019 0956     Imaging I have reviewed the images obtained:  CT-scan of the brain: No acute  abnormality  CTA head and neck: No emergent LVO, moderate stenosis of bilateral vertebral artery and left V2  MRI examination of the brain: Pending  Assessment: 89 year old patient with history of dementia with Lewy bodies, hypothyroidism and chronic kidney disease stage III presented with acute onset speech difficulty and generalized weakness.  She was in her usual state of health until about 830 this morning, when she was abruptly unable to walk around, had to get into a wheelchair and developed difficulty speaking.  She did present to the emergency department yesterday for a fall in which she struck her head, but head CT at that time was negative for acute abnormality.  On exam, she appears encephalopathic, has generalized weakness along with dysarthric voice and difficulty communicating.  She is able to answer some questions and name some objects but not others.  CT head was negative for acute abnormality.  Will obtain MRI as well as EEG to rule out seizure activity given patient's history of dementia.  Patient will also need workup for urinary tract infection or other source of toxic metabolic encephalopathy.  Impression: Generalized weakness and speech disturbance with differential to include toxic metabolic encephalopathy, stroke and seizure activity  Recommendations: -Brain MRI -Routine EEG -Workup for toxic metabolic encephalopathy per primary team  Pt seen by NP/Neuro and later by MD. Note/plan to be edited by MD as needed.  Cortney E Ernestina Columbia , MSN, AGACNP-BC [facility] See Amion for schedule and pager information 05/21/2023 1:51 PM  NEUROHOSPITALIST ADDENDUM Performed a face to face diagnostic evaluation.   I have reviewed the contents of history and physical exam as documented by PA/ARNP/Resident and agree with above documentation.  I have discussed and formulated the above plan as documented. Edits to the note have been made as needed.  Impression/Key exam  findings/Plan: appears mostly encephalopathic but with her dementia and overall poor participation with exam, it is hard to exclude aphasia out of proportion to encephalopathy.  Seems  like this was rather acute in onset. Will therefore ask for MRI Brain and rEEG to start with. If the MRI is concerning for stroke, will need full stroke workup. With her falls, also considering seizures and therefore will get rEEG.  Will ask ED team to get a tox/metabolic screen and look for occult UTI or Pneumonia or viral infection.  Erick Blinks, MD [facility] [phone]   If 7pm to 7am, please call on call as listed on AMION.

## 2023-05-21 NOTE — ED Notes (Signed)
EEG at bedside.

## 2023-05-21 NOTE — ED Notes (Signed)
ED TO INPATIENT HANDOFF REPORT  ED Nurse Name and Phone #: [phone]  S Name/Age/Gender [name] 89 y.o. female Room/Bed: 015C/015C  Code Status   Code Status: Full Code  Home/SNF/Other Nursing Home Patient oriented to: self Is this baseline? Yes   Triage Complete: Triage complete  Chief Complaint Acute encephalopathy [G93.40]  Triage Note Pt coming from [facility] for acute onset of aphasia and generalized weakness this morning, Pt seen after a fall last night on blood thinners with a clear CT performed at [facility] ED.   Pt baseline ambulate with a walker and feed herself  Hx dementia  18 LAC 55 hr 200/110   LNW 0830   Allergies Allergies  Allergen Reactions   Oxycodone Hcl Itching    Level of Care/Admitting Diagnosis ED Disposition     ED Disposition  Admit   Condition  --   Comment  Hospital Area: [facility] [[id number]]  Level of Care: Telemetry Medical [104]  May place patient in observation at [facility] or [facility] if equivalent level of care is available:: No  Covid Evaluation: Asymptomatic - no recent exposure (last 10 days) testing not required  Diagnosis: Acute encephalopathy [147829]  Admitting Physician: Clydie Braun [[id number]]  Attending Physician: Clydie Braun [[id number]]          B Medical/Surgery History Past Medical History:  Diagnosis Date   Anemia    Chronic fatigue    Chronic renal insufficiency, stage III (moderate) (HCC)    Depression    HYPERTENSION 04/04/2007   HYPOTHYROIDISM 04/04/2007   Lewy body dementia (HCC)    Macular degeneration    Past Surgical History:  Procedure Laterality Date   ABDOMINAL HYSTERECTOMY     APPENDECTOMY     BACK SURGERY  2012   Cataracts     CHOLECYSTECTOMY     ERCP N/A 11/22/2014   Procedure: ENDOSCOPIC RETROGRADE CHOLANGIOPANCREATOGRAPHY (ERCP);  Surgeon: Vida Rigger, MD;  Location: [facility];  Service: Endoscopy;  Laterality: N/A;   HERNIA  REPAIR     OOPHORECTOMY     ORIF TIBIA FRACTURE  2012   dr Lajoyce Corners   [facility] SURGERY  2011   cervical     A IV Location/Drains/Wounds Patient Lines/Drains/Airways Status     Active Line/Drains/Airways     None            Intake/Output Last 24 hours No intake or output data in the 24 hours ending 05/21/23 1527  Labs/Imaging Results for orders placed or performed during the hospital encounter of 05/21/23 (from the past 48 hour(s))  CBG monitoring, ED     Status: Abnormal   Collection Time: 05/21/23  1:16 PM  Result Value Ref Range   Glucose-Capillary 123 (H) 70 - 99 mg/dL    Comment: Glucose reference range applies only to samples taken after fasting for at least 8 hours.  Ethanol     Status: None   Collection Time: 05/21/23  1:21 PM  Result Value Ref Range   Alcohol, Ethyl (B) <10 <10 mg/dL    Comment: (NOTE) Lowest detectable limit for serum alcohol is 10 mg/dL.  For medical purposes only. Performed at [facility], [address] [place], [place] [address]   Protime-INR     Status: None   Collection Time: 05/21/23  1:21 PM  Result Value Ref Range   Prothrombin Time 13.5 11.4 - 15.2 seconds   INR 1.0 0.8 - 1.2  Comment: (NOTE) INR goal varies based on device and disease states. Performed at [facility], [address], [place], [place] [address]   APTT     Status: None   Collection Time: 05/21/23  1:21 PM  Result Value Ref Range   aPTT 31 24 - 36 seconds    Comment: Performed at [facility], [address], [place], [place] [address]  CBC     Status: Abnormal   Collection Time: 05/21/23  1:21 PM  Result Value Ref Range   WBC 6.1 4.0 - 10.5 K/uL   RBC 3.03 (L) 3.87 - 5.11 MIL/uL   Hemoglobin 9.3 (L) 12.0 - 15.0 g/dL   HCT 09.8 (L) 11.9 - 14.7 %   MCV 99.3 80.0 - 100.0 fL   MCH 30.7 26.0 - 34.0 pg   MCHC 30.9 30.0 - 36.0 g/dL   RDW 82.9 56.2 - 13.0 %   Platelets 213 150 - 400 K/uL   nRBC 0.0 0.0 - 0.2 %    Comment: Performed at  [facility], [address] [place], [place] [address]  Differential     Status: None   Collection Time: 05/21/23  1:21 PM  Result Value Ref Range   Neutrophils Relative % 68 %   Neutro Abs 4.1 1.7 - 7.7 K/uL   Lymphocytes Relative 21 %   Lymphs Abs 1.3 0.7 - 4.0 K/uL   Monocytes Relative 8 %   Monocytes Absolute 0.5 0.1 - 1.0 K/uL   Eosinophils Relative 2 %   Eosinophils Absolute 0.1 0.0 - 0.5 K/uL   Basophils Relative 1 %   Basophils Absolute 0.1 0.0 - 0.1 K/uL   Immature Granulocytes 0 %   Abs Immature Granulocytes 0.01 0.00 - 0.07 K/uL    Comment: Performed at [facility], [address], [place], [place] [address]  Comprehensive metabolic panel     Status: Abnormal   Collection Time: 05/21/23  1:21 PM  Result Value Ref Range   Sodium 141 135 - 145 mmol/L   Potassium 3.1 (L) 3.5 - 5.1 mmol/L   Chloride 104 98 - 111 mmol/L   CO2 26 22 - 32 mmol/L   Glucose, Bld 131 (H) 70 - 99 mg/dL    Comment: Glucose reference range applies only to samples taken after fasting for at least 8 hours.   BUN 12 8 - 23 mg/dL   Creatinine, Ser 9.52 (H) 0.44 - 1.00 mg/dL   Calcium 8.7 (L) 8.9 - 10.3 mg/dL   Total Protein 6.1 (L) 6.5 - 8.1 g/dL   Albumin 3.3 (L) 3.5 - 5.0 g/dL   AST 16 15 - 41 U/L   ALT 11 0 - 44 U/L   Alkaline Phosphatase 60 38 - 126 U/L   Total Bilirubin 0.4 0.3 - 1.2 mg/dL   GFR, Estimated 44 (L) >60 mL/min    Comment: (NOTE) Calculated using the CKD-EPI Creatinine Equation (2021)    Anion gap 11 5 - 15    Comment: Performed at [facility], [address] [place], [place] [address]  I-stat chem 8, ED     Status: Abnormal   Collection Time: 05/21/23  1:25 PM  Result Value Ref Range   Sodium 144 135 - 145 mmol/L   Potassium 3.1 (L) 3.5 - 5.1 mmol/L   Chloride 104 98 - 111 mmol/L   BUN 12 8 - 23 mg/dL   Creatinine, Ser 4.40 (H) 0.44 - 1.00  mg/dL   Glucose, Bld 409 (H) 70 - 99 mg/dL    Comment: Glucose reference range applies only to samples taken  after fasting for at least 8 hours.   Calcium, Ion 1.13 (L) 1.15 - 1.40 mmol/L   TCO2 27 22 - 32 mmol/L   Hemoglobin 9.5 (L) 12.0 - 15.0 g/dL   HCT 81.1 (L) 91.4 - 78.2 %   CT ANGIO HEAD NECK W WO CM (CODE STROKE)  Result Date: 05/21/2023 CLINICAL DATA:  Neuro deficit, acute, stroke suspected EXAM: CT ANGIOGRAPHY HEAD AND NECK WITH AND WITHOUT CONTRAST TECHNIQUE: Multidetector CT imaging of the head and neck was performed using the standard protocol during bolus administration of intravenous contrast. Multiplanar CT image reconstructions and MIPs were obtained to evaluate the vascular anatomy. Carotid stenosis measurements (when applicable) are obtained utilizing NASCET criteria, using the distal internal carotid diameter as the denominator. RADIATION DOSE REDUCTION: This exam was performed according to the departmental dose-optimization program which includes automated exposure control, adjustment of the mA and/or kV according to patient size and/or use of iterative reconstruction technique. CONTRAST:  75mL OMNIPAQUE IOHEXOL 350 MG/ML SOLN COMPARISON:  None Available. FINDINGS: CTA NECK FINDINGS Aortic arch: Great vessel origins are patent. Atherosclerosis of the aorta and great vessel origins. Right carotid system: Atherosclerosis at the carotid bifurcation without greater than 50% stenosis. Left carotid system: Atherosclerosis at the carotid bifurcation involving the proximal ICA without greater than 50% stenosis. Vertebral arteries: Moderate bilateral vertebral artery origin stenosis. Moderate stenosis of the distal left V2 vertebral artery in a region of tortuosity. Skeleton: No acute abnormality on limited assessment. Multilevel ACDF. Other neck: No acute abnormality on limited assessment. Upper chest: Visualized lung apices are clear. Review of the MIP images confirms the above findings CTA HEAD FINDINGS Anterior circulation: Bilateral intracranial ICAs, MCAs, and ACAs are patent without proximal  hemodynamically significant stenosis. Posterior circulation: Left lateral intradural vertebral arteries, basilar artery and bilateral posterior cerebral arteries are patent without proximal hemodynamically significant stenosis. Venous sinuses: As permitted by contrast timing, patent. Review of the MIP images confirms the above findings IMPRESSION: 1. No emergent large vessel occlusion. 2. Moderate stenosis of bilateral vertebral artery origins and the left V2 vertebral artery. 3.  Aortic Atherosclerosis (ICD10-I70.0). Electronically Signed   By: Feliberto Harts M.D.   On: 05/21/2023 14:04   CT HEAD CODE STROKE WO CONTRAST  Result Date: 05/21/2023 CLINICAL DATA:  Code stroke.  Neuro deficit, acute, stroke suspected EXAM: CT HEAD WITHOUT CONTRAST TECHNIQUE: Contiguous axial images were obtained from the base of the skull through the vertex without intravenous contrast. RADIATION DOSE REDUCTION: This exam was performed according to the departmental dose-optimization program which includes automated exposure control, adjustment of the mA and/or kV according to patient size and/or use of iterative reconstruction technique. COMPARISON:  CT Head 05/20/23 FINDINGS: Brain: No evidence of acute infarction, hemorrhage, hydrocephalus, extra-axial collection or mass lesion/mass effect. Sequela of moderate chronic microvascular ischemic change. Vascular: No hyperdense vessel or unexpected calcification. Skull: Normal. Negative for fracture or focal lesion. Sinuses/Orbits: No middle ear or mastoid effusion. Paranasal sinuses are clear. Bilateral lens replacement. Orbits are otherwise unremarkable. Other: None. ASPECTS (Alberta Stroke Program Early CT Score): 10 IMPRESSION: 1. No hemorrhage or CT evidence of an acute cortical infarct. ASPECTS is 10. 2. Sequela of moderate chronic microvascular ischemic change. Findings were paged to Dr. Derry Lory on 05/21/2023 at 1:45 p.m. via [facility] paging system. Electronically Signed    By: Elige Radon.D.  On: 05/21/2023 13:45   DG Pelvis 1-2 Views  Result Date: 05/20/2023 CLINICAL DATA:  Fall EXAM: PELVIS - 1-2 VIEW COMPARISON:  CT 12/09/2022 FINDINGS: SI joints are non widened. Pubic symphysis appears intact. Questionable right superior pubic ramus fracture. The femoral heads project in joint. IMPRESSION: Questionable right superior pubic ramus fracture. Electronically Signed   By: Jasmine Pang M.D.   On: 05/20/2023 21:06   DG Elbow 2 Views Left  Result Date: 05/20/2023 CLINICAL DATA:  Fall with bruising EXAM: LEFT ELBOW - 2 VIEW COMPARISON:  12/10/2022 FINDINGS: There is no evidence of fracture, dislocation, or joint effusion. There is no evidence of arthropathy or other focal bone abnormality. Soft tissues are unremarkable. IMPRESSION: Negative. Electronically Signed   By: Jasmine Pang M.D.   On: 05/20/2023 21:04   DG Chest Portable 1 View  Result Date: 05/20/2023 CLINICAL DATA:  Fall EXAM: PORTABLE CHEST 1 VIEW COMPARISON:  CT 12/09/2022, chest x-ray 12/17/2016 FINDINGS: Hardware in the cervical spine. Mildly diminished lung volume. Cardiac enlargement. Aortic atherosclerosis. Upper normal mediastinal with likely augmented by portable technique and low lung volume. No acute airspace disease, pleural effusion, or pneumothorax IMPRESSION: Low lung volumes.  Cardiomegaly. Electronically Signed   By: Jasmine Pang M.D.   On: 05/20/2023 21:03   CT Head Wo Contrast  Result Date: 05/20/2023 CLINICAL DATA:  Patient fell and hit head. Skin tear to left elbow and bruising to upper arm. Dementia. EXAM: CT HEAD WITHOUT CONTRAST TECHNIQUE: Contiguous axial images were obtained from the base of the skull through the vertex without intravenous contrast. RADIATION DOSE REDUCTION: This exam was performed according to the departmental dose-optimization program which includes automated exposure control, adjustment of the mA and/or kV according to patient size and/or use of iterative  reconstruction technique. COMPARISON:  CT head and cervical spine 12/09/2022 FINDINGS: CT HEAD FINDINGS Brain: No intracranial hemorrhage, mass effect, or evidence of acute infarct. No hydrocephalus. No extra-axial fluid collection. Age related cerebral atrophy and chronic small vessel ischemic disease. Vascular: No hyperdense vessel. Intracranial arterial calcification. Skull: No fracture or focal lesion. Sinuses/Orbits: No acute finding. Other: None. CT CERVICAL SPINE FINDINGS Alignment: No evidence of traumatic malalignment. Skull base and vertebrae: No acute fracture. No primary bone lesion or focal pathologic process. Soft tissues and spinal canal: No prevertebral fluid or swelling. No visible canal hematoma. Disc levels: ACDF C4-C5 and C6-C7. No significant spinal canal. Multilevel facet arthropathy. Upper chest: No acute abnormality. Other: Carotid calcification. IMPRESSION: 1. No acute intracranial abnormality. 2. No acute fracture in the cervical spine. Electronically Signed   By: Minerva Fester M.D.   On: 05/20/2023 21:00   CT Cervical Spine Wo Contrast  Result Date: 05/20/2023 CLINICAL DATA:  Patient fell and hit head. Skin tear to left elbow and bruising to upper arm. Dementia. EXAM: CT HEAD WITHOUT CONTRAST TECHNIQUE: Contiguous axial images were obtained from the base of the skull through the vertex without intravenous contrast. RADIATION DOSE REDUCTION: This exam was performed according to the departmental dose-optimization program which includes automated exposure control, adjustment of the mA and/or kV according to patient size and/or use of iterative reconstruction technique. COMPARISON:  CT head and cervical spine 12/09/2022 FINDINGS: CT HEAD FINDINGS Brain: No intracranial hemorrhage, mass effect, or evidence of acute infarct. No hydrocephalus. No extra-axial fluid collection. Age related cerebral atrophy and chronic small vessel ischemic disease. Vascular: No hyperdense vessel.  Intracranial arterial calcification. Skull: No fracture or focal lesion. Sinuses/Orbits: No acute finding. Other: None. CT CERVICAL  SPINE FINDINGS Alignment: No evidence of traumatic malalignment. Skull base and vertebrae: No acute fracture. No primary bone lesion or focal pathologic process. Soft tissues and spinal canal: No prevertebral fluid or swelling. No visible canal hematoma. Disc levels: ACDF C4-C5 and C6-C7. No significant spinal canal. Multilevel facet arthropathy. Upper chest: No acute abnormality. Other: Carotid calcification. IMPRESSION: 1. No acute intracranial abnormality. 2. No acute fracture in the cervical spine. Electronically Signed   By: Minerva Fester M.D.   On: 05/20/2023 21:00    Pending Labs Unresulted Labs (From admission, onward)     Start     Ordered   05/22/23 0500  CBC  Tomorrow morning,   R        05/21/23 1527   05/22/23 0500  Basic metabolic panel  Tomorrow morning,   R        05/21/23 1527   05/21/23 1321  Urine rapid drug screen (hosp performed)  Once,   STAT        05/21/23 1321   05/21/23 1321  Urinalysis, Routine w reflex microscopic -Urine, Clean Catch  Once,   URGENT       Question:  Specimen Source  Answer:  Urine, Clean Catch   05/21/23 1321            Vitals/Pain Today's Vitals   05/21/23 1345 05/21/23 1400 05/21/23 1430 05/21/23 1500  BP: (!) 125/113 (!) 166/117 (!) 181/58 (!) 145/66  Pulse: 61 (!) 50 (!) 51 (!) 52  Resp: (!) 24 16 13 15   Temp: 97.8 F (36.6 C)     TempSrc: Axillary     SpO2: 94% 96% 95% 95%  Weight:        Isolation Precautions No active isolations  Medications Medications  iohexol (OMNIPAQUE) 350 MG/ML injection 75 mL (has no administration in time range)  donepezil (ARICEPT) tablet 10 mg (has no administration in time range)  memantine (NAMENDA) tablet 10 mg (has no administration in time range)  QUEtiapine (SEROQUEL) tablet 50 mg (has no administration in time range)  sertraline (ZOLOFT) tablet 100 mg (has  no administration in time range)  levothyroxine (SYNTHROID) tablet 150 mcg (has no administration in time range)  pantoprazole (PROTONIX) EC tablet 40 mg (has no administration in time range)  ferrous sulfate EC tablet 324 mg (has no administration in time range)  polyethylene glycol (MIRALAX / GLYCOLAX) packet 17 g (has no administration in time range)  divalproex (DEPAKOTE SPRINKLE) capsule 125 mg (has no administration in time range)  polyvinyl alcohol (LIQUIFILM TEARS) 1.4 % ophthalmic solution 1 drop (has no administration in time range)  enoxaparin (LOVENOX) injection 40 mg (has no administration in time range)  sodium chloride flush (NS) 0.9 % injection 3 mL (has no administration in time range)  acetaminophen (TYLENOL) tablet 650 mg (has no administration in time range)    Or  acetaminophen (TYLENOL) suppository 650 mg (has no administration in time range)  albuterol (PROVENTIL) (2.5 MG/3ML) 0.083% nebulizer solution 2.5 mg (has no administration in time range)  iohexol (OMNIPAQUE) 350 MG/ML injection 75 mL (75 mLs Intravenous Contrast Given 05/21/23 1344)    Mobility walks with device     Focused Assessments Cardiac Assessment Handoff:  Cardiac Rhythm: Sinus bradycardia Lab Results  Component Value Date   CKTOTAL 67 12/09/2022   No results found for: "DDIMER" Does the Patient currently have chest pain? No   , Neuro Assessment Handoff:  Swallow screen pass? Not completed yet Cardiac Rhythm: Sinus bradycardia NIH Stroke  Scale  Dizziness Present: No Headache Present: No Interval: Initial Level of Consciousness (1a.)   : Alert, keenly responsive LOC Questions (1b. )   : Answers neither question correctly LOC Commands (1c. )   : Performs both tasks correctly Best Gaze (2. )  : Normal Visual (3. )  : No visual loss Facial Palsy (4. )    : Normal symmetrical movements Motor Arm, Left (5a. )   : No drift Motor Arm, Right (5b. ) : No drift Motor Leg, Left (6a. )  : Some  effort against gravity Motor Leg, Right (6b. ) : Some effort against gravity Limb Ataxia (7. ): Absent Sensory (8. )  : Normal, no sensory loss Best Language (9. )  : Mild-to-moderate aphasia Dysarthria (10. ): Mild-to-moderate dysarthria, patient slurs at least some words and, at worst, can be understood with some difficulty Extinction/Inattention (11.)   : No Abnormality Complete NIHSS TOTAL: 8 Last date known well: 05/21/23 Last time known well: 0830 Neuro Assessment:   Neuro Checks:   Initial (05/21/23 1330)  Has TPA been given? No If patient is a Neuro Trauma and patient is going to OR before floor call report to 4N Charge nurse: [phone] or [phone]   R Recommendations: See Admitting Provider Note  Report given to:   Additional Notes:

## 2023-05-21 NOTE — ED Triage Notes (Addendum)
Pt coming from [facility] for acute onset of aphasia and generalized weakness this morning, Pt seen after a fall last night on blood thinners with a clear CT performed at [facility] ED.   Pt baseline ambulate with a walker and feed herself  Hx dementia  18 LAC 55 hr 200/110   LNW 0830

## 2023-05-21 NOTE — Procedures (Signed)
Patient Name: SKYRAH KRUPP  MRN: [id number]  Epilepsy Attending: Charlsie Quest  Referring Physician/Provider: Marjorie Smolder, NP  Date: 05/21/2023 Duration: 24.26 mins  Patient history:  89 y.o. female with history of dementia with Lewy bodies, hypothyroidism and chronic kidney disease stage III who presents with acute onset speech disturbance and generalized weakness. EEG to evaluate for seizure  Level of alertness: Awake, asleep  AEDs during EEG study: VPA  Technical aspects: This EEG study was done with scalp electrodes positioned according to the 10-20 International system of electrode placement. Electrical activity was reviewed with band pass filter of 1-70Hz , sensitivity of 7 uV/mm, display speed of 51mm/sec with a 60Hz  notched filter applied as appropriate. EEG data were recorded continuously and digitally stored.  Video monitoring was available and reviewed as appropriate.  Description: No clear posterior dominant rhythm was seen. Sleep was characterized by vertex waves, sleep spindles (12 to 14 Hz), maximal frontocentral region. EEG showed continuous generalized 3 to 6 Hz theta-delta slowing. Physiologic photic driving was not seen during photic stimulation. Hyperventilation was not performed.     ABNORMALITY - Continuous slow, generalized  IMPRESSION: This study is suggestive of moderate diffuse encephalopathy. No seizures or epileptiform discharges were seen throughout the recording.  Yasha Tibbett Annabelle Harman

## 2023-05-22 DIAGNOSIS — Z885 Allergy status to narcotic agent status: Secondary | ICD-10-CM | POA: Diagnosis not present

## 2023-05-22 DIAGNOSIS — W01198A Fall on same level from slipping, tripping and stumbling with subsequent striking against other object, initial encounter: Secondary | ICD-10-CM | POA: Diagnosis present

## 2023-05-22 DIAGNOSIS — E876 Hypokalemia: Secondary | ICD-10-CM | POA: Diagnosis present

## 2023-05-22 DIAGNOSIS — R2689 Other abnormalities of gait and mobility: Secondary | ICD-10-CM | POA: Diagnosis not present

## 2023-05-22 DIAGNOSIS — Z79899 Other long term (current) drug therapy: Secondary | ICD-10-CM | POA: Diagnosis not present

## 2023-05-22 DIAGNOSIS — G3183 Dementia with Lewy bodies: Secondary | ICD-10-CM | POA: Diagnosis present

## 2023-05-22 DIAGNOSIS — E039 Hypothyroidism, unspecified: Secondary | ICD-10-CM | POA: Diagnosis present

## 2023-05-22 DIAGNOSIS — M6281 Muscle weakness (generalized): Secondary | ICD-10-CM | POA: Diagnosis not present

## 2023-05-22 DIAGNOSIS — F0284 Dementia in other diseases classified elsewhere, unspecified severity, with anxiety: Secondary | ICD-10-CM | POA: Diagnosis present

## 2023-05-22 DIAGNOSIS — N1832 Chronic kidney disease, stage 3b: Secondary | ICD-10-CM | POA: Diagnosis present

## 2023-05-22 DIAGNOSIS — Z90710 Acquired absence of both cervix and uterus: Secondary | ICD-10-CM | POA: Diagnosis not present

## 2023-05-22 DIAGNOSIS — R296 Repeated falls: Secondary | ICD-10-CM | POA: Diagnosis present

## 2023-05-22 DIAGNOSIS — R1311 Dysphagia, oral phase: Secondary | ICD-10-CM | POA: Diagnosis not present

## 2023-05-22 DIAGNOSIS — R2681 Unsteadiness on feet: Secondary | ICD-10-CM | POA: Diagnosis not present

## 2023-05-22 DIAGNOSIS — F4323 Adjustment disorder with mixed anxiety and depressed mood: Secondary | ICD-10-CM | POA: Diagnosis present

## 2023-05-22 DIAGNOSIS — I160 Hypertensive urgency: Secondary | ICD-10-CM | POA: Diagnosis present

## 2023-05-22 DIAGNOSIS — G9340 Encephalopathy, unspecified: Secondary | ICD-10-CM | POA: Diagnosis present

## 2023-05-22 DIAGNOSIS — M4696 Unspecified inflammatory spondylopathy, lumbar region: Secondary | ICD-10-CM | POA: Diagnosis not present

## 2023-05-22 DIAGNOSIS — R4701 Aphasia: Secondary | ICD-10-CM | POA: Diagnosis present

## 2023-05-22 DIAGNOSIS — S50312A Abrasion of left elbow, initial encounter: Secondary | ICD-10-CM | POA: Diagnosis present

## 2023-05-22 DIAGNOSIS — Z87891 Personal history of nicotine dependence: Secondary | ICD-10-CM | POA: Diagnosis not present

## 2023-05-22 DIAGNOSIS — K219 Gastro-esophageal reflux disease without esophagitis: Secondary | ICD-10-CM | POA: Diagnosis present

## 2023-05-22 DIAGNOSIS — Z79890 Hormone replacement therapy (postmenopausal): Secondary | ICD-10-CM | POA: Diagnosis not present

## 2023-05-22 DIAGNOSIS — R41841 Cognitive communication deficit: Secondary | ICD-10-CM | POA: Diagnosis not present

## 2023-05-22 DIAGNOSIS — Z9181 History of falling: Secondary | ICD-10-CM | POA: Diagnosis not present

## 2023-05-22 DIAGNOSIS — R471 Dysarthria and anarthria: Secondary | ICD-10-CM | POA: Diagnosis present

## 2023-05-22 DIAGNOSIS — F0283 Dementia in other diseases classified elsewhere, unspecified severity, with mood disturbance: Secondary | ICD-10-CM | POA: Diagnosis present

## 2023-05-22 DIAGNOSIS — Y92129 Unspecified place in nursing home as the place of occurrence of the external cause: Secondary | ICD-10-CM | POA: Diagnosis not present

## 2023-05-22 DIAGNOSIS — I129 Hypertensive chronic kidney disease with stage 1 through stage 4 chronic kidney disease, or unspecified chronic kidney disease: Secondary | ICD-10-CM | POA: Diagnosis present

## 2023-05-22 DIAGNOSIS — I674 Hypertensive encephalopathy: Secondary | ICD-10-CM | POA: Diagnosis present

## 2023-05-22 DIAGNOSIS — Z66 Do not resuscitate: Secondary | ICD-10-CM | POA: Diagnosis present

## 2023-05-22 DIAGNOSIS — R531 Weakness: Secondary | ICD-10-CM | POA: Diagnosis not present

## 2023-05-22 DIAGNOSIS — Z741 Need for assistance with personal care: Secondary | ICD-10-CM | POA: Diagnosis not present

## 2023-05-22 DIAGNOSIS — Z7401 Bed confinement status: Secondary | ICD-10-CM | POA: Diagnosis not present

## 2023-05-22 DIAGNOSIS — D631 Anemia in chronic kidney disease: Secondary | ICD-10-CM | POA: Diagnosis present

## 2023-05-22 LAB — CBC
HCT: 32.8 % — ABNORMAL LOW (ref 36.0–46.0)
Hemoglobin: 10.4 g/dL — ABNORMAL LOW (ref 12.0–15.0)
MCH: 30.5 pg (ref 26.0–34.0)
MCHC: 31.7 g/dL (ref 30.0–36.0)
MCV: 96.2 fL (ref 80.0–100.0)
Platelets: 229 10*3/uL (ref 150–400)
RBC: 3.41 MIL/uL — ABNORMAL LOW (ref 3.87–5.11)
RDW: 14.6 % (ref 11.5–15.5)
WBC: 6.6 10*3/uL (ref 4.0–10.5)
nRBC: 0 % (ref 0.0–0.2)

## 2023-05-22 LAB — URINALYSIS, ROUTINE W REFLEX MICROSCOPIC
Bacteria, UA: NONE SEEN
Bilirubin Urine: NEGATIVE
Glucose, UA: NEGATIVE mg/dL
Hgb urine dipstick: NEGATIVE
Ketones, ur: NEGATIVE mg/dL
Leukocytes,Ua: NEGATIVE
Nitrite: NEGATIVE
Protein, ur: >=300 mg/dL — AB
Specific Gravity, Urine: >1.046 — ABNORMAL HIGH (ref 1.005–1.030)
pH: 6 (ref 5.0–8.0)

## 2023-05-22 LAB — LIPID PANEL
Cholesterol: 243 mg/dL — ABNORMAL HIGH (ref 0–200)
HDL: 40 mg/dL — ABNORMAL LOW (ref >40)
LDL Cholesterol: 170 mg/dL — ABNORMAL HIGH (ref 0–99)
Total CHOL/HDL Ratio: 6.1 {ratio}
Triglycerides: 163 mg/dL — ABNORMAL HIGH (ref <150)
VLDL: 33 mg/dL (ref 0–40)

## 2023-05-22 LAB — RPR: RPR Ser Ql: NONREACTIVE

## 2023-05-22 LAB — BASIC METABOLIC PANEL
Anion gap: 12 (ref 5–15)
BUN: 11 mg/dL (ref 8–23)
CO2: 25 mmol/L (ref 22–32)
Calcium: 9.1 mg/dL (ref 8.9–10.3)
Chloride: 105 mmol/L (ref 98–111)
Creatinine, Ser: 1.19 mg/dL — ABNORMAL HIGH (ref 0.44–1.00)
GFR, Estimated: 43 mL/min — ABNORMAL LOW (ref >60)
Glucose, Bld: 109 mg/dL — ABNORMAL HIGH (ref 70–99)
Potassium: 3 mmol/L — ABNORMAL LOW (ref 3.5–5.1)
Sodium: 142 mmol/L (ref 135–145)

## 2023-05-22 LAB — FOLATE: Folate: 17.1 ng/mL (ref >5.9)

## 2023-05-22 LAB — T4, FREE: Free T4: 0.67 ng/dL (ref 0.61–1.12)

## 2023-05-22 LAB — VITAMIN B12: Vitamin B-12: 458 pg/mL (ref 180–914)

## 2023-05-22 MED ORDER — POTASSIUM CHLORIDE 20 MEQ PO PACK
40.0000 meq | PACK | Freq: Once | ORAL | Status: AC
  Administered 2023-05-22: 40 meq via ORAL
  Filled 2023-05-22: qty 2

## 2023-05-22 MED ORDER — AMLODIPINE BESYLATE 10 MG PO TABS
10.0000 mg | ORAL_TABLET | Freq: Every day | ORAL | Status: DC
  Administered 2023-05-22 – 2023-05-25 (×4): 10 mg via ORAL
  Filled 2023-05-22 (×4): qty 1

## 2023-05-22 NOTE — Evaluation (Signed)
Occupational Therapy Evaluation Patient Details Name: [name] MRN: [id number] DOB: 13-Mar-1929 Today's Date: 05/22/2023   History of Present Illness Pt is 89 yo presenting to [facility] ED for acute onset of speech disturbance and generalized weakness. Pt was seen in ER on 10/10 for fall at nursing facility where she hit her head. PMH: anemia, depression, HTN, lewy body dementia, macular degeneration, hypothyroidism, CKD.   Clinical Impression   Pt questionable historian, per chart review was using RW at baseline for mobility and assume had assist form ADLs as pt was at [facility]. Pt currently oriented to self only, needs set up - max A for ADLs, mod +2 for bed mobility and mod A+2 for pivot transfers with RW. Able to transfer to [facility] and to chair, performed seated pericare. Pt presenting with impairments listed below, will follow acutely. Patient will benefit from continued inpatient follow up therapy, <3 hours/day to maximize safety/ind with ADLs/functional mobility.       If plan is discharge home, recommend the following: A lot of help with walking and/or transfers;A lot of help with bathing/dressing/bathroom;Assistance with cooking/housework;Direct supervision/assist for medications management;Direct supervision/assist for financial management;Assist for transportation;Help with stairs or ramp for entrance;Supervision due to cognitive status    Functional Status Assessment  Patient has had a recent decline in their functional status and demonstrates the ability to make significant improvements in function in a reasonable and predictable amount of time.  Equipment Recommendations  Other (comment) (defer)    Recommendations for Other Services PT consult     Precautions / Restrictions Precautions Precautions: Fall Restrictions Weight Bearing Restrictions: No      Mobility Bed Mobility Overal bed mobility: Needs Assistance Bed Mobility: Supine to Sit     Supine to sit: +2 for  physical assistance, Mod assist     General bed mobility comments: +2 with bil LE and trunk to get to sitting EOB. Pt then required Mod A to scoot toward EOB. Pt performed correct body mechanics and was able to get some momentum to scoot    Transfers Overall transfer level: Needs assistance Equipment used: Rolling walker (2 wheels), 2 person hand held assist Transfers: Sit to/from Stand, Bed to chair/wheelchair/BSC Sit to Stand: Mod assist, +2 physical assistance, +2 safety/equipment     Step pivot transfers: Mod assist, +2 physical assistance, +2 safety/equipment            Balance Overall balance assessment: Needs assistance Sitting-balance support: Bilateral upper extremity supported Sitting balance-Leahy Scale: Fair Sitting balance - Comments: Able to sit EOB   Standing balance support: Bilateral upper extremity supported, Reliant on assistive device for balance Standing balance-Leahy Scale: Zero Standing balance comment: Reliant on external suppor to remain standing and go from sitting to standing due to poor body mechanics and strength                           ADL either performed or assessed with clinical judgement   ADL Overall ADL's : Needs assistance/impaired Eating/Feeding: Set up;Sitting   Grooming: Minimal assistance;Sitting   Upper Body Bathing: Moderate assistance   Lower Body Bathing: Maximal assistance   Upper Body Dressing : Moderate assistance   Lower Body Dressing: Maximal assistance   Toilet Transfer: Moderate assistance;+2 for physical assistance   Toileting- Clothing Manipulation and Hygiene: Maximal assistance       Functional mobility during ADLs: Moderate assistance;+2 for physical assistance       Vision Baseline Vision/History:  1 Wears glasses Vision Assessment?: No apparent visual deficits     Perception Perception: Not tested       Praxis Praxis: Not tested       Pertinent Vitals/Pain Pain Assessment Pain  Assessment: Faces Pain Score: 2  Pain Location: pt does not state Pain Intervention(s): Limited activity within patient's tolerance, Monitored during session, Repositioned     Extremity/Trunk Assessment Upper Extremity Assessment Upper Extremity Assessment: Generalized weakness   Lower Extremity Assessment Lower Extremity Assessment: Defer to PT evaluation   Cervical / Trunk Assessment Cervical / Trunk Assessment: Normal   Communication Communication Communication: No apparent difficulties Cueing Techniques: Verbal cues;Gestural cues;Tactile cues;Visual cues   Cognition Arousal: Lethargic Behavior During Therapy: Flat affect Overall Cognitive Status: History of cognitive impairments - at baseline                                 General Comments: hx of dementia, oriented to self and DOB, unaware she is in the hospital     General Comments  VSS    Exercises     Shoulder Instructions      Home Living Family/patient expects to be discharged to:: Skilled nursing facility Living Arrangements: Alone Available Help at Discharge: Personal care attendant;Available PRN/intermittently Type of Home: House Home Access: Level entry     Home Layout: One level     Bathroom Shower/Tub: Chief Strategy Officer: Standard Bathroom Accessibility: Yes   Home Equipment: Rollator (4 wheels);BSC/3in1   Additional Comments: information for home set up from chart. Pt is from nursing facility.      Prior Functioning/Environment Prior Level of Function : Needs assist             Mobility Comments: per previous chart pt states she uses walker ADLs Comments: assume assist for ADLs        OT Problem List: Decreased strength;Decreased range of motion;Decreased activity tolerance;Impaired balance (sitting and/or standing);Decreased cognition;Decreased safety awareness;Decreased knowledge of use of DME or AE      OT Treatment/Interventions: Self-care/ADL  training;Therapeutic exercise;Energy conservation;DME and/or AE instruction;Therapeutic activities;Balance training;Cognitive remediation/compensation;Visual/perceptual remediation/compensation;Patient/family education    OT Goals(Current goals can be found in the care plan section) Acute Rehab OT Goals Patient Stated Goal: none stated OT Goal Formulation: Patient unable to participate in goal setting Time For Goal Achievement: 06/05/23 Potential to Achieve Goals: Good  OT Frequency: Min 1X/week    Co-evaluation              AM-PAC OT "6 Clicks" Daily Activity     Outcome Measure Help from another person eating meals?: None Help from another person taking care of personal grooming?: A Little Help from another person toileting, which includes using toliet, bedpan, or urinal?: A Lot Help from another person bathing (including washing, rinsing, drying)?: A Lot Help from another person to put on and taking off regular upper body clothing?: A Little Help from another person to put on and taking off regular lower body clothing?: A Lot 6 Click Score: 16   End of Session Equipment Utilized During Treatment: Gait belt;Rolling walker (2 wheels) Nurse Communication: Mobility status  Activity Tolerance: Patient tolerated treatment well Patient left: with call bell/phone within reach;in chair;with chair alarm set  OT Visit Diagnosis: Unsteadiness on feet (R26.81);Other abnormalities of gait and mobility (R26.89);Muscle weakness (generalized) (M62.81)                Time:  6644-0347 OT Time Calculation (min): 22 min Charges:  OT General Charges $OT Visit: 1 Visit OT Evaluation $OT Eval Moderate Complexity: 1 Mod  Sahian Kerney K, OTD, OTR/L SecureChat Preferred Acute Rehab [phone]   Carver Fila Koonce 05/22/2023, 12:06 PM

## 2023-05-22 NOTE — Progress Notes (Addendum)
PROGRESS NOTE    [name]  EXB:[id number] DOB: [birth year] DOA: 05/21/2023 PCP: Shirline Frees, NP   Brief Narrative:  This 89 yrs old female with PMH significant for hypertension, hypothyroidism, Lewy body dementia who presented after being found slumped in her wheelchair and unable to speak.  Patient was seen in the ED on 10/10 after fall at her SNF. CT scan of the head and cervical spine did not note any acute abnormality.  Patient was discharged back to SNF.  She presented in the ED as a stroke code. CT imaging of the head was performed and did not note any acute abnormality.  CTA head showed no large vessel occlusion.  Neurology was consulted, recommended to have an MRI and EEG.  Patient was admitted for further evaluation.   Assessment & Plan:   Principal Problem:   Dysarthria Active Problems:   Weakness   Hypertensive urgency   Hypokalemia   Hypocalcemia   Dementia without behavioral disturbance (HCC)   Hypothyroidism   Anemia of chronic disease   CKD (chronic kidney disease) stage 3, GFR 30-59 ml/min (HCC)   Anxiety and depression   GERD (gastroesophageal reflux disease)   Generalized weakness and dysarthria: Patient presented with weakness and dysarthria. Normally patient is able to ambulate and communicate without issue.   Today she was found weak and slumped over in the chair.  She was also found to have difficulty with speech in the a[place].   CT Head negative for acute abnormality. CT angiogram of the head did not note any large vessel occlusion.   Differential diagnosis TIA/stroke, seizure, infection,  uncontrolled blood pressures, or possibly progression of dementia. Neurochecks every 4 hours. EEG> No evidence of seizures. MRI brain> No acute abnormality found. Follow-up urinalysis to rule out infectious cause for encephalopathy. PT/OT to evaluate and treat Appreciate neurology consultation.   Hypertensive urgency: Blood pressures were noted to be elevated  up to 220/74.  Resume home blood pressure medications including amlodipine and olmesartan. Continue hydralazine IV as needed. Blood pressure is improving.   Hypokalemia: Replaced.  Continue to monitor   Dementia: Continue Aricept 10 mg daily. Continue Namenda 10 mg daily. Continue Seroquel and Depakote. Delirium precautions  Hypothyroidism: Continue levothyroxine   Anemia of chronic disease: Hemoglobin 9.3 g/dL which appears around patient's baseline.   Continue to monitor   Chronic kidney disease stage IIIb: Serum creatinine at baseline. Continue to monitor.   Anxiety and depression: Continue Zoloft.   GERD: Continue Protonix.   DVT prophylaxis: Lovenox Code Status: DNR Family Communication:No family at bed side. Disposition Plan:    Status is: Observation The patient remains OBS appropriate and will d/c before 2 midnights.   Admitted for stroke workup and subclinical seizures.  MRI and EEG is ordered.  Consultants:  Neurology  Procedures: MRI , EEG Antimicrobials:  Anti-infectives (From admission, onward)    None      Subjective: Patient was seen and examined at bedside.  Overnight events noted.   Patient appears confused but following commands. She is able to move all 4 extremities.  Objective: Vitals:   05/22/23 0322 05/22/23 0327 05/22/23 0411 05/22/23 0859  BP: (!) 180/50 (!) 188/54 (!) 161/52 (!) 152/54  Pulse: (!) 52 (!) 50 62 68  Resp: 18   16  Temp: 98 F (36.7 C)   98.4 F (36.9 C)  TempSrc: Oral   Oral  SpO2: 90%   97%  Weight:        Intake/Output Summary (Last  24 hours) at 05/22/2023 1151 Last data filed at 05/22/2023 0859 Gross per 24 hour  Intake --  Output 700 ml  Net -700 ml   Filed Weights   06/06/23 1300  Weight: 66 kg   Examination:  General exam: Appears calm and comfortable, deconditioned, not in any acute distress. Respiratory system: CTA bilaterally. Respiratory effort normal.  RR 15 Cardiovascular system:  S1 & S2 heard, RRR. No JVD, murmurs, rubs, gallops or clicks. Gastrointestinal system: Abdomen is non distended, soft and non tender. Normal bowel sounds heard. Central nervous system: Alert and oriented x 2. No focal neurological deficits. Extremities: No edema, no cyanosis, no clubbing Skin: No rashes, lesions or ulcers Psychiatry:  Mood & affect appropriate.   Data Reviewed: I have personally reviewed following labs and imaging studies  CBC: Recent Labs  Lab 06-Jun-2023 1321 06/06/23 1325 05/22/23 0426  WBC 6.1  --  6.6  NEUTROABS 4.1  --   --   HGB 9.3* 9.5* 10.4*  HCT 30.1* 28.0* 32.8*  MCV 99.3  --  96.2  PLT 213  --  229   Basic Metabolic Panel: Recent Labs  Lab 06-Jun-2023 1321 2023-06-06 1325 05/22/23 0426  NA 141 144 142  K 3.1* 3.1* 3.0*  CL 104 104 105  CO2 26  --  25  GLUCOSE 131* 132* 109*  BUN 12 12 11   CREATININE 1.17* 1.40* 1.19*  CALCIUM 8.7*  --  9.1   GFR: Estimated Creatinine Clearance: 26.9 mL/min (A) (by C-G formula based on SCr of 1.19 mg/dL (H)). Liver Function Tests: Recent Labs  Lab 06-Jun-2023 1321  AST 16  ALT 11  ALKPHOS 60  BILITOT 0.4  PROT 6.1*  ALBUMIN 3.3*   No results for input(s): "LIPASE", "AMYLASE" in the last 168 hours. No results for input(s): "AMMONIA" in the last 168 hours. Coagulation Profile: Recent Labs  Lab 06-06-23 1321  INR 1.0   Cardiac Enzymes: No results for input(s): "CKTOTAL", "CKMB", "CKMBINDEX", "TROPONINI" in the last 168 hours. BNP (last 3 results) No results for input(s): "PROBNP" in the last 8760 hours. HbA1C: No results for input(s): "HGBA1C" in the last 72 hours. CBG: Recent Labs  Lab 06-06-23 1316  GLUCAP 123*   Lipid Profile: Recent Labs    05/22/23 0426  CHOL 243*  HDL 40*  LDLCALC 170*  TRIG 163*  CHOLHDL 6.1   Thyroid Function Tests: Recent Labs    Jun 06, 2023 2226 05/22/23 0426  TSH 19.892*  --   FREET4  --  0.67   Anemia Panel: Recent Labs    05/22/23 0920  VITAMINB12 458   FOLATE 17.1   Sepsis Labs: No results for input(s): "PROCALCITON", "LATICACIDVEN" in the last 168 hours.  No results found for this or any previous visit (from the past 240 hour(s)).   Radiology Studies: EEG adult  Result Date: June 06, 2023 Charlsie Quest, MD     2023-06-06  6:16 PM Patient Name: [name] KRABBENHOFT MRN: [id number] Epilepsy Attending: Charlsie Quest Referring Physician/Provider: Marjorie Smolder, NP Date: Jun 06, 2023 Duration: 24.26 mins Patient history:  89 y.o. female with history of dementia with Lewy bodies, hypothyroidism and chronic kidney disease stage III who presents with acute onset speech disturbance and generalized weakness. EEG to evaluate for seizure Level of alertness: Awake, asleep AEDs during EEG study: VPA Technical aspects: This EEG study was done with scalp electrodes positioned according to the 10-20 International system of electrode placement. Electrical activity was reviewed with band pass filter  of 1-70Hz , sensitivity of 7 uV/mm, display speed of 55mm/sec with a 60Hz  notched filter applied as appropriate. EEG data were recorded continuously and digitally stored.  Video monitoring was available and reviewed as appropriate. Description: No clear posterior dominant rhythm was seen. Sleep was characterized by vertex waves, sleep spindles (12 to 14 Hz), maximal frontocentral region. EEG showed continuous generalized 3 to 6 Hz theta-delta slowing. Physiologic photic driving was not seen during photic stimulation. Hyperventilation was not performed.   ABNORMALITY - Continuous slow, generalized IMPRESSION: This study is suggestive of moderate diffuse encephalopathy. No seizures or epileptiform discharges were seen throughout the recording. Charlsie Quest   MR BRAIN WO CONTRAST  Result Date: 05/21/2023 CLINICAL DATA:  Delirium EXAM: MRI HEAD WITHOUT CONTRAST TECHNIQUE: Multiplanar, multiecho pulse sequences of the brain and surrounding structures were obtained  without intravenous contrast. COMPARISON:  CT head May 21, 2023. FINDINGS: Brain: No acute infarction, hemorrhage, hydrocephalus, extra-axial collection or mass lesion. Vascular: Major arterial flow voids are maintained at the skull base. Skull and upper cervical spine: Normal marrow signal. Sinuses/Orbits: Mostly clear sinuses.  No acute orbital findings. Other: No mastoid effusions. IMPRESSION: Unremarkable brain MRI for patient age. No evidence of acute abnormality. Electronically Signed   By: Feliberto Harts M.D.   On: 05/21/2023 17:53   CT ANGIO HEAD NECK W WO CM (CODE STROKE)  Result Date: 05/21/2023 CLINICAL DATA:  Neuro deficit, acute, stroke suspected EXAM: CT ANGIOGRAPHY HEAD AND NECK WITH AND WITHOUT CONTRAST TECHNIQUE: Multidetector CT imaging of the head and neck was performed using the standard protocol during bolus administration of intravenous contrast. Multiplanar CT image reconstructions and MIPs were obtained to evaluate the vascular anatomy. Carotid stenosis measurements (when applicable) are obtained utilizing NASCET criteria, using the distal internal carotid diameter as the denominator. RADIATION DOSE REDUCTION: This exam was performed according to the departmental dose-optimization program which includes automated exposure control, adjustment of the mA and/or kV according to patient size and/or use of iterative reconstruction technique. CONTRAST:  75mL OMNIPAQUE IOHEXOL 350 MG/ML SOLN COMPARISON:  None Available. FINDINGS: CTA NECK FINDINGS Aortic arch: Great vessel origins are patent. Atherosclerosis of the aorta and great vessel origins. Right carotid system: Atherosclerosis at the carotid bifurcation without greater than 50% stenosis. Left carotid system: Atherosclerosis at the carotid bifurcation involving the proximal ICA without greater than 50% stenosis. Vertebral arteries: Moderate bilateral vertebral artery origin stenosis. Moderate stenosis of the distal left V2 vertebral  artery in a region of tortuosity. Skeleton: No acute abnormality on limited assessment. Multilevel ACDF. Other neck: No acute abnormality on limited assessment. Upper chest: Visualized lung apices are clear. Review of the MIP images confirms the above findings CTA HEAD FINDINGS Anterior circulation: Bilateral intracranial ICAs, MCAs, and ACAs are patent without proximal hemodynamically significant stenosis. Posterior circulation: Left lateral intradural vertebral arteries, basilar artery and bilateral posterior cerebral arteries are patent without proximal hemodynamically significant stenosis. Venous sinuses: As permitted by contrast timing, patent. Review of the MIP images confirms the above findings IMPRESSION: 1. No emergent large vessel occlusion. 2. Moderate stenosis of bilateral vertebral artery origins and the left V2 vertebral artery. 3.  Aortic Atherosclerosis (ICD10-I70.0). Electronically Signed   By: Feliberto Harts M.D.   On: 05/21/2023 14:04   CT HEAD CODE STROKE WO CONTRAST  Result Date: 05/21/2023 CLINICAL DATA:  Code stroke.  Neuro deficit, acute, stroke suspected EXAM: CT HEAD WITHOUT CONTRAST TECHNIQUE: Contiguous axial images were obtained from the base of the skull through the vertex without  intravenous contrast. RADIATION DOSE REDUCTION: This exam was performed according to the departmental dose-optimization program which includes automated exposure control, adjustment of the mA and/or kV according to patient size and/or use of iterative reconstruction technique. COMPARISON:  CT Head 05/20/23 FINDINGS: Brain: No evidence of acute infarction, hemorrhage, hydrocephalus, extra-axial collection or mass lesion/mass effect. Sequela of moderate chronic microvascular ischemic change. Vascular: No hyperdense vessel or unexpected calcification. Skull: Normal. Negative for fracture or focal lesion. Sinuses/Orbits: No middle ear or mastoid effusion. Paranasal sinuses are clear. Bilateral lens  replacement. Orbits are otherwise unremarkable. Other: None. ASPECTS ([place] Stroke Program Early CT Score): 10 IMPRESSION: 1. No hemorrhage or CT evidence of an acute cortical infarct. ASPECTS is 10. 2. Sequela of moderate chronic microvascular ischemic change. Findings were paged to Dr. Derry Lory on 05/21/2023 at 1:45 p.m. via [facility] paging system. Electronically Signed   By: Lorenza Cambridge M.D.   On: 05/21/2023 13:45   DG Pelvis 1-2 Views  Result Date: 05/20/2023 CLINICAL DATA:  Fall EXAM: PELVIS - 1-2 VIEW COMPARISON:  CT 12/09/2022 FINDINGS: SI joints are non widened. Pubic symphysis appears intact. Questionable right superior pubic ramus fracture. The femoral heads project in joint. IMPRESSION: Questionable right superior pubic ramus fracture. Electronically Signed   By: Jasmine Pang M.D.   On: 05/20/2023 21:06   DG Elbow 2 Views Left  Result Date: 05/20/2023 CLINICAL DATA:  Fall with bruising EXAM: LEFT ELBOW - 2 VIEW COMPARISON:  12/10/2022 FINDINGS: There is no evidence of fracture, dislocation, or joint effusion. There is no evidence of arthropathy or other focal bone abnormality. Soft tissues are unremarkable. IMPRESSION: Negative. Electronically Signed   By: Jasmine Pang M.D.   On: 05/20/2023 21:04   DG Chest Portable 1 View  Result Date: 05/20/2023 CLINICAL DATA:  Fall EXAM: PORTABLE CHEST 1 VIEW COMPARISON:  CT 12/09/2022, chest x-ray 12/17/2016 FINDINGS: Hardware in the cervical spine. Mildly diminished lung volume. Cardiac enlargement. Aortic atherosclerosis. Upper normal mediastinal with likely augmented by portable technique and low lung volume. No acute airspace disease, pleural effusion, or pneumothorax IMPRESSION: Low lung volumes.  Cardiomegaly. Electronically Signed   By: Jasmine Pang M.D.   On: 05/20/2023 21:03   CT Head Wo Contrast  Result Date: 05/20/2023 CLINICAL DATA:  Patient fell and hit head. Skin tear to left elbow and bruising to upper arm. Dementia. EXAM: CT  HEAD WITHOUT CONTRAST TECHNIQUE: Contiguous axial images were obtained from the base of the skull through the vertex without intravenous contrast. RADIATION DOSE REDUCTION: This exam was performed according to the departmental dose-optimization program which includes automated exposure control, adjustment of the mA and/or kV according to patient size and/or use of iterative reconstruction technique. COMPARISON:  CT head and cervical spine 12/09/2022 FINDINGS: CT HEAD FINDINGS Brain: No intracranial hemorrhage, mass effect, or evidence of acute infarct. No hydrocephalus. No extra-axial fluid collection. Age related cerebral atrophy and chronic small vessel ischemic disease. Vascular: No hyperdense vessel. Intracranial arterial calcification. Skull: No fracture or focal lesion. Sinuses/Orbits: No acute finding. Other: None. CT CERVICAL SPINE FINDINGS Alignment: No evidence of traumatic malalignment. Skull base and vertebrae: No acute fracture. No primary bone lesion or focal pathologic process. Soft tissues and spinal canal: No prevertebral fluid or swelling. No visible canal hematoma. Disc levels: ACDF C4-C5 and C6-C7. No significant spinal canal. Multilevel facet arthropathy. Upper chest: No acute abnormality. Other: Carotid calcification. IMPRESSION: 1. No acute intracranial abnormality. 2. No acute fracture in the cervical spine. Electronically Signed   By: Joselyn Glassman  Stutzman M.D.   On: 05/20/2023 21:00   CT Cervical Spine Wo Contrast  Result Date: 05/20/2023 CLINICAL DATA:  Patient fell and hit head. Skin tear to left elbow and bruising to upper arm. Dementia. EXAM: CT HEAD WITHOUT CONTRAST TECHNIQUE: Contiguous axial images were obtained from the base of the skull through the vertex without intravenous contrast. RADIATION DOSE REDUCTION: This exam was performed according to the departmental dose-optimization program which includes automated exposure control, adjustment of the mA and/or kV according to patient  size and/or use of iterative reconstruction technique. COMPARISON:  CT head and cervical spine 12/09/2022 FINDINGS: CT HEAD FINDINGS Brain: No intracranial hemorrhage, mass effect, or evidence of acute infarct. No hydrocephalus. No extra-axial fluid collection. Age related cerebral atrophy and chronic small vessel ischemic disease. Vascular: No hyperdense vessel. Intracranial arterial calcification. Skull: No fracture or focal lesion. Sinuses/Orbits: No acute finding. Other: None. CT CERVICAL SPINE FINDINGS Alignment: No evidence of traumatic malalignment. Skull base and vertebrae: No acute fracture. No primary bone lesion or focal pathologic process. Soft tissues and spinal canal: No prevertebral fluid or swelling. No visible canal hematoma. Disc levels: ACDF C4-C5 and C6-C7. No significant spinal canal. Multilevel facet arthropathy. Upper chest: No acute abnormality. Other: Carotid calcification. IMPRESSION: 1. No acute intracranial abnormality. 2. No acute fracture in the cervical spine. Electronically Signed   By: Minerva Fester M.D.   On: 05/20/2023 21:00    Scheduled Meds:  amLODipine  10 mg Oral Daily   divalproex  125 mg Oral BID   donepezil  10 mg Oral QHS   enoxaparin (LOVENOX) injection  40 mg Subcutaneous Q24H   ferrous sulfate  324 mg Oral Daily   irbesartan  75 mg Oral Daily   levothyroxine  150 mcg Oral QAC breakfast   memantine  10 mg Oral QHS   pantoprazole  40 mg Oral BID   QUEtiapine  50 mg Oral BID   sertraline  100 mg Oral BID   sodium chloride flush  3 mL Intravenous Q12H   Continuous Infusions:   LOS: 0 days    Time spent: 50 mins.    Willeen Niece, MD [facility]   If 7PM-7AM, please contact night-coverage

## 2023-05-22 NOTE — TOC Initial Note (Addendum)
Transition of Care [facility]) - Initial/Assessment Note    Patient Details  Name: [name] MRN: [id number] Date of Birth: 1928/11/01  [facility] CM/SW Contact:    Deatra Robinson, LCSW Phone Number: 05/22/2023, 1:24 PM  Clinical Narrative:  pt admitted from [facility]. Pt appears to be a LTC SNF resident at [facility]. Await confirmation from [facility] admissions. TOC will follow.    UPDATE 1615: Starr at [facility] confirmed pt is a LTC resident and able to return at dc.   Dellie Burns, MSW, LCSW [phone] (coverage)              Expected Discharge Plan: Skilled Nursing Facility Barriers to Discharge: Continued Medical Work up   Patient Goals and CMS Choice            Expected Discharge Plan and Services     Post Acute Care Choice: Skilled Nursing Facility Living arrangements for the past 2 months: Skilled Nursing Facility                                      Prior Living Arrangements/Services Living arrangements for the past 2 months: Skilled Nursing Facility Lives with:: Facility Resident Patient language and need for interpreter reviewed:: No        Need for Family Participation in Patient Care: Yes (Comment) Care giver support system in place?: Yes (comment)   Criminal Activity/Legal Involvement Pertinent to Current Situation/Hospitalization: No - Comment as needed  Activities of Daily Living      Permission Sought/Granted                  Emotional Assessment       Orientation: : Oriented to Self Alcohol / Substance Use: Not Applicable Psych Involvement: No (comment)  Admission diagnosis:  Acute encephalopathy [G93.40] Altered mental status, unspecified altered mental status type [R41.82] Patient Active Problem List   Diagnosis Date Noted   Acute encephalopathy 05/21/2023   Dysarthria 05/21/2023   Hypokalemia 05/21/2023   Hypertensive urgency 05/21/2023   Dementia without behavioral disturbance (HCC)  05/21/2023   Anxiety and depression 05/21/2023   Hypocalcemia 05/21/2023   GERD (gastroesophageal reflux disease) 05/21/2023   Fall 12/10/2022   Weakness 05/04/2022   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   CKD (chronic kidney disease) stage 3, GFR 30-59 ml/min (HCC) 03/31/2019   Anemia 03/07/2015   Transaminitis 11/20/2014   Hyponatremia 10/09/2014   Lightheaded 08/30/2014   Right hip pain 07/25/2014   Chronic neck pain 06/06/2014   Memory loss 06/23/2013   Right shoulder pain 06/23/2013   Eczema 01/16/2013   Leg edema 08/23/2012   Chronic insomnia 08/09/2012   Renal insufficiency 03/31/2011   CYSTOCELE WITHOUT MENTION UTERINE PROLAPSE LAT 08/29/2010   Anemia of chronic disease 05/06/2010   LIVER FUNCTION TESTS, ABNORMAL, HX OF 01/14/2009   Hypothyroidism 04/04/2007   Adjustment disorder with mixed anxiety and depressed mood 04/04/2007   Essential hypertension 04/04/2007   PCP:  Shirline Frees, NP Pharmacy:   [id number] - [place], [place] - [address] [place] [address] Phone: [phone] Fax: [phone]  [facility] - [place], [place] - [address] [place] [address] Phone: [phone] Fax: [phone]     Social Determinants of Health (SDOH) Social History: SDOH Screenings  Food Insecurity: No Food Insecurity (06/05/2022)  Housing: Low Risk  (09/25/2020)  Transportation Needs: No Transportation Needs (06/05/2022)  Alcohol Screen: Low Risk  (09/25/2020)  Depression (PHQ2-9): Low Risk  (06/05/2022)  Financial Resource Strain: Low Risk  (06/05/2022)  Physical Activity: Inactive (06/05/2022)  Social Connections: Moderately Isolated (09/25/2020)  Stress: Stress Concern Present (06/05/2022)  Tobacco Use: Medium Risk (05/20/2023)   SDOH Interventions:     Readmission Risk Interventions     No data to display

## 2023-05-22 NOTE — Progress Notes (Signed)
Neurology Progress Note  Brief HPI: 89 year old patient with history of dementia with Lewy bodies, hypothyroidism, hypertension and chronic kidney disease stage III presented with acute onset speech disturbance and generalized weakness.  She did have a fall on 10/10, but imaging after the fall was normal.  Yesterday, she was unable to ambulate and required a wheelchair for mobility when she normally is very active.  She was quite hypertensive on arrival with systolic blood pressure in the 200s.  This morning, she continues to be confused and encephalopathic and is requiring assistance to eat her breakfast.  Subjective: Patient continues to be hypertensive overnight, but blood pressure is lowered from admission.  She is able to state her name but continues to be disoriented to place, time and situation.  Exam: Vitals:   05/22/23 0327 05/22/23 0411  BP: (!) 188/54 (!) 161/52  Pulse: (!) 50 62  Resp:    Temp:    SpO2:     Gen: In bed, NAD Resp: non-labored breathing, no acute distress Abd: soft, nt  Neuro: Mental Status: Patient is alert, able to state her name but is disoriented to place time and situation.  She can follow simple step but not two-step commands. Cranial Nerves: Pupils equal round and reactive, extraocular movements intact, facial sensation symmetrical, face symmetrical, hearing intact to voice, phonation normal Motor: Able to move all 4 extremities with good antigravity strength, upper extremity stronger the lower extremities Sensory: Intact to light touch throughout Gait: Deferred  Pertinent Labs:    Latest Ref Rng & Units 05/22/2023    4:26 AM 05/21/2023    1:25 PM 05/21/2023    1:21 PM  CBC  WBC 4.0 - 10.5 K/uL 6.6   6.1   Hemoglobin 12.0 - 15.0 g/dL 69.6  9.5  9.3   Hematocrit 36.0 - 46.0 % 32.8  28.0  30.1   Platelets 150 - 400 K/uL 229   213        Latest Ref Rng & Units 05/22/2023    4:26 AM 05/21/2023    1:25 PM 05/21/2023    1:21 PM  BMP  Glucose 70  - 99 mg/dL 295  284  132   BUN 8 - 23 mg/dL 11  12  12    Creatinine 0.44 - 1.00 mg/dL 4.40  1.02  7.25   Sodium 135 - 145 mmol/L 142  144  141   Potassium 3.5 - 5.1 mmol/L 3.0  3.1  3.1   Chloride 98 - 111 mmol/L 105  104  104   CO2 22 - 32 mmol/L 25   26   Calcium 8.9 - 10.3 mg/dL 9.1   8.7   TSH 36.644 T4 pending Thiamine pending B12 pending Folate pending RPR pending Urinalysis pending  Imaging Reviewed:  CT head: Negative for acute abnormality  MRI brain: Negative for acute abnormality  EEG negative for seizures or epileptiform discharges  Assessment: 89 year old patient with history of dementia with Lewy bodies, hypothyroidism, hypertension and chronic kidney disease stage III presented with acute onset speech difficulty and generalized weakness.  She was noted to be quite hypertensive on arrival, but MRI was negative for PRES. According to RN, her mental status and ability to communicate have improved slightly overnight.  She does seem more responsive today, although she is still disoriented to place, time and situation.  EEG was negative for seizure activity, and CT and MRI were negative for acute abnormality.  Urinalysis is pending, and urinary tract infection could certainly explain confusion  and weakness in a 89 year old patient.  Hypertensive encephalopathy is also on the differential given the patient's systolic blood pressure was in the 200s on admission.  TSH was noted to be quite high, and hypothyroidism could be contributing to patient's altered mental status.  Impression: Hypertensive encephalopathy versus toxic metabolic encephalopathy  Recommendations: -Urinalysis -Further workup for toxic metabolic encephalopathy per primary team -Maintain normotension -T4, thiamine, B12, folate, RPR   Patient seen by NP and then by MD, MD to edit note as needed.   E Ernestina Columbia , MSN, AGACNP-BC [facility] See Amion for schedule and pager  information 05/22/2023 8:24 AM   NEUROHOSPITALIST ADDENDUM Performed a face to face diagnostic evaluation.   I have reviewed the contents of history and physical exam as documented by PA/ARNP/Resident and agree with above documentation.  I have discussed and formulated the above plan as documented. Edits to the note have been made as needed.  Impression/Key exam findings/Plan: encephalopathy likely a combination of hypertensive encephalopathy and advanced age. Aim for gradual normotension. MRI brain and rEEG non revealing. Suspect she is becoming delirious in the hospital now. We will  signoff. Please feel free to contact us with any questions or concerns.  Erick Blinks, MD [facility] [phone]   If 7pm to 7am, please call on call as listed on AMION.

## 2023-05-22 NOTE — Evaluation (Signed)
Clinical/Bedside Swallow Evaluation Patient Details  Name: [name] MRN: [id number] Date of Birth: 08-Aug-1929  Today's Date: 05/22/2023 Time: SLP Start Time (ACUTE ONLY): 1043 SLP Stop Time (ACUTE ONLY): 1054 SLP Time Calculation (min) (ACUTE ONLY): 11 min  Past Medical History:  Past Medical History:  Diagnosis Date   Anemia    Chronic fatigue    Chronic renal insufficiency, stage III (moderate) (HCC)    Depression    HYPERTENSION 04/04/2007   HYPOTHYROIDISM 04/04/2007   Lewy body dementia (HCC)    Macular degeneration    Past Surgical History:  Past Surgical History:  Procedure Laterality Date   ABDOMINAL HYSTERECTOMY     APPENDECTOMY     BACK SURGERY  2012   Cataracts     CHOLECYSTECTOMY     ERCP N/A 11/22/2014   Procedure: ENDOSCOPIC RETROGRADE CHOLANGIOPANCREATOGRAPHY (ERCP);  Surgeon: Vida Rigger, MD;  Location: [facility] ENDOSCOPY;  Service: Endoscopy;  Laterality: N/A;   HERNIA REPAIR     OOPHORECTOMY     ORIF TIBIA FRACTURE  2012   dr Curt Bears SURGERY  2011   cervical   HPI:  [name] is a 89 y.o. female with medical history significant of hypertension, hypothyroidism, Lewy body dementia who presented after being found slumped in her wheelchair and unable to speak. Per chart pt had been seen in the emergency department 10/10 after having a fall at the nursing facility. CT scan of the head and cervical spine did not note any acute abnormality.  Patient was discharged back to the nursing facility in her normal state of health. Pt then found to have difficulty walking and talking and sent back to hospilal. MRI No evidence of acute abnormality.    Assessment / Plan / Recommendation  Clinical Impression  Pt exhibited normal oropharyngeal swallow. Oromotor movements are slow but symmetrical and most dentition is intact (states she may be missing a few posterior). She exhibited a moderately strong volitional cough. SIps thin liquid via cup and straw consumed without s/s  aspiration. Oral transit of applesauce adequate and pt masticated graham cracker timely without residue noted. She did have one cough at end of session that did not appear related to po intake. RN stated she ate her breakfast without difficulty this morning. Recommend continue regular texture, thin liquids, pills with thin and encourage pt to self feed. No follow up needed for swallow. Pt did have difficulty fully expressing herself and speech-language-cognitive assessment is pending. SLP Visit Diagnosis: Dysphagia, unspecified (R13.10)    Aspiration Risk  Mild aspiration risk    Diet Recommendation Thin liquid;Regular    Liquid Administration via: Cup;Straw Medication Administration: Whole meds with liquid Supervision: Patient able to self feed Compensations: Slow rate;Small sips/bites Postural Changes: Seated upright at 90 degrees    Other  Recommendations Oral Care Recommendations: Oral care BID    Recommendations for follow up therapy are one component of a multi-disciplinary discharge planning process, led by the attending physician.  Recommendations may be updated based on patient status, additional functional criteria and insurance authorization.  Follow up Recommendations No SLP follow up      Assistance Recommended at Discharge    Functional Status Assessment Patient has not had a recent decline in their functional status  Frequency and Duration            Prognosis        Swallow Study   General Date of Onset: 05/21/23 HPI: [name] is a 89 y.o.  female with medical history significant of hypertension, hypothyroidism, Lewy body dementia who presented after being found slumped in her wheelchair and unable to speak. Per chart pt had been seen in the emergency department 10/10 after having a fall at the nursing facility. CT scan of the head and cervical spine did not note any acute abnormality.  Patient was discharged back to the nursing facility in her normal state of  health. Pt then found to have difficulty walking and talking and sent back to hospilal. MRI No evidence of acute abnormality. Type of Study: Bedside Swallow Evaluation Previous Swallow Assessment:  (05/05/22 regulat thin) Diet Prior to this Study: Regular;Thin liquids (Level 0) Temperature Spikes Noted: No Respiratory Status: Room air History of Recent Intubation: No Behavior/Cognition: Alert;Pleasant mood;Cooperative Oral Cavity Assessment: Within Functional Limits Oral Care Completed by SLP: No Oral Cavity - Dentition: Adequate natural dentition (states she is missing a few in the back) Vision: Functional for self-feeding Patient Positioning: Upright in chair Baseline Vocal Quality: Normal Volitional Cough: Strong Volitional Swallow: Able to elicit    Oral/Motor/Sensory Function Overall Oral Motor/Sensory Function: Within functional limits   Ice Chips Ice chips: Not tested   Thin Liquid Thin Liquid: Within functional limits Presentation: Cup;Straw    Nectar Thick Nectar Thick Liquid: Not tested   Honey Thick Honey Thick Liquid: Not tested   Puree Puree: Within functional limits   Solid     Solid: Within functional limits      Royce Macadamia 05/22/2023,11:06 AM

## 2023-05-22 NOTE — Evaluation (Signed)
Physical Therapy Evaluation Patient Details Name: [name] MRN: [id number] DOB: 07-Nov-1928 Today's Date: 05/22/2023  History of Present Illness  Pt is 89 yo presenting to [facility] ED for acute onset of speech disturbance and generalized weakness. Pt was seen in ER on 10/10 for fall at nursing facility where she hit her head. PMH: anemia, depression, HTN, lewy body dementia, macular degeneration, hypothyroidism, CKD.  Clinical Impression  Pt is presenting below baseline level of functioning. Currently pt is Total assist with all functional mobility. Pt is able to follow directions inconsistently and with extra time. Pt was motivated to mobilize and get out of bed. Due to pt current functional status, home set up and available assistance recommending skilled physical therapy services < 3 hours/day in order to decrease risk for falls, immobility, skin break down, injury and re-hospitalization. Pt tolerated treatment well.       If plan is discharge home, recommend the following: Two people to help with walking and/or transfers;Assistance with cooking/housework;Assist for transportation;Help with stairs or ramp for entrance   Can travel by private vehicle   No    Equipment Recommendations Other (comment) (defer to post acute)     Functional Status Assessment Patient has had a recent decline in their functional status and demonstrates the ability to make significant improvements in function in a reasonable and predictable amount of time.     Precautions / Restrictions Precautions Precautions: Fall Restrictions Weight Bearing Restrictions: No      Mobility  Bed Mobility Overal bed mobility: Needs Assistance Bed Mobility: Supine to Sit     Supine to sit: +2 for physical assistance, Mod assist     General bed mobility comments: +2 with bil LE and trunk to get to sitting EOB. Pt then required Mod A to scoot toward EOB. Pt performed correct body mechanics and was able to get some  momentum to scoot    Transfers Overall transfer level: Needs assistance Equipment used: Rolling walker (2 wheels), 2 person hand held assist Transfers: Sit to/from Stand, Bed to chair/wheelchair/BSC Sit to Stand: Mod assist, +2 physical assistance, +2 safety/equipment   Step pivot transfers: Mod assist, +2 physical assistance, +2 safety/equipment       General transfer comment: Pt performed sit to stand from EOB at [facility] +2 with Mod A requires blocking the legs to prevent LE sliding out from under her. Pt then tried to stand from [facility] with feet out and very far apart requires heavy cueing and physical assistance to stand from [facility] and take steps toward recliner with Mod A navigating AD.    Ambulation/Gait   Pre-gait activities: Took steps at EOB to [facility] then [facility] to recliner with low foot clearance, significant difficulty managing AD very close to front of device then running device into objects. Small steps flat foot contact.       Balance Overall balance assessment: Needs assistance Sitting-balance support: Bilateral upper extremity supported Sitting balance-Leahy Scale: Fair Sitting balance - Comments: Able to sit EOB   Standing balance support: Bilateral upper extremity supported, Reliant on assistive device for balance Standing balance-Leahy Scale: Zero Standing balance comment: Reliant on external suppor to remain standing and go from sitting to standing due to poor body mechanics and strength         Pertinent Vitals/Pain Pain Assessment Pain Assessment: Faces Faces Pain Scale: Hurts a little bit Pain Location: unclear pt unable to state. Purewick was bothering at one point but that was resolved. Pain Intervention(s): Monitored during session  Home Living Family/patient expects to be discharged to:: Skilled nursing facility Living Arrangements: Alone Available Help at Discharge: Personal care attendant;Available PRN/intermittently Type of Home: House Home Access:  Level entry       Home Layout: One level Home Equipment: Rollator (4 wheels);BSC/3in1 Additional Comments: information for home set up from chart. Pt is from nursing facility.    Prior Function   Mobility Comments: per previous chart pt states she uses walker       Extremity/Trunk Assessment   Upper Extremity Assessment Upper Extremity Assessment: Defer to OT evaluation    Lower Extremity Assessment Lower Extremity Assessment: Generalized weakness    Cervical / Trunk Assessment Cervical / Trunk Assessment: Normal  Communication   Communication Communication: No apparent difficulties Cueing Techniques: Verbal cues;Gestural cues;Tactile cues;Visual cues  Cognition Arousal: Lethargic Behavior During Therapy: Flat affect Overall Cognitive Status: History of cognitive impairments - at baseline        General Comments General comments (skin integrity, edema, etc.): Purple/Blue area on the medial/anterior R elbow with edema        Assessment/Plan    PT Assessment Patient needs continued PT services  PT Problem List Decreased strength;Decreased mobility;Decreased safety awareness;Decreased activity tolerance;Decreased balance       PT Treatment Interventions DME instruction;Therapeutic exercise;Gait training;Balance training;Neuromuscular re-education;Functional mobility training;Therapeutic activities;Patient/family education    PT Goals (Current goals can be found in the Care Plan section)  Acute Rehab PT Goals Patient Stated Goal: PT  Goal - Improve overall mobility to decrease burden of care PT Goal Formulation: Patient unable to participate in goal setting Time For Goal Achievement: 06/05/23 Potential to Achieve Goals: Fair    Frequency Min 1X/week        AM-PAC PT "6 Clicks" Mobility  Outcome Measure Help needed turning from your back to your side while in a flat bed without using bedrails?: Total Help needed moving from lying on your back to sitting on  the side of a flat bed without using bedrails?: Total Help needed moving to and from a bed to a chair (including a wheelchair)?: Total Help needed standing up from a chair using your arms (e.g., wheelchair or bedside chair)?: Total Help needed to walk in hospital room?: Total Help needed climbing 3-5 steps with a railing? : Total 6 Click Score: 6    End of Session Equipment Utilized During Treatment: Gait belt Activity Tolerance: Patient limited by lethargy Patient left: in chair;with call bell/phone within reach;with chair alarm set Nurse Communication: Mobility status PT Visit Diagnosis: Other abnormalities of gait and mobility (R26.89);Muscle weakness (generalized) (M62.81)    Time: 4098-1191 PT Time Calculation (min) (ACUTE ONLY): 21 min   Charges:   PT Evaluation $PT Eval Low Complexity: 1 Low   PT General Charges $$ ACUTE PT VISIT: 1 Visit         Harrel Carina, DPT, CLT  Acute Rehabilitation Services Office: [phone] (Secure chat preferred)   Claudia Desanctis 05/22/2023, 11:52 AM

## 2023-05-23 DIAGNOSIS — R471 Dysarthria and anarthria: Secondary | ICD-10-CM | POA: Diagnosis not present

## 2023-05-23 LAB — BASIC METABOLIC PANEL
Anion gap: 7 (ref 5–15)
BUN: 20 mg/dL (ref 8–23)
CO2: 24 mmol/L (ref 22–32)
Calcium: 8.6 mg/dL — ABNORMAL LOW (ref 8.9–10.3)
Chloride: 106 mmol/L (ref 98–111)
Creatinine, Ser: 1.63 mg/dL — ABNORMAL HIGH (ref 0.44–1.00)
GFR, Estimated: 29 mL/min — ABNORMAL LOW (ref >60)
Glucose, Bld: 110 mg/dL — ABNORMAL HIGH (ref 70–99)
Potassium: 3.1 mmol/L — ABNORMAL LOW (ref 3.5–5.1)
Sodium: 137 mmol/L (ref 135–145)

## 2023-05-23 LAB — PHOSPHORUS: Phosphorus: 3.5 mg/dL (ref 2.5–4.6)

## 2023-05-23 LAB — CBC
HCT: 29.7 % — ABNORMAL LOW (ref 36.0–46.0)
Hemoglobin: 9.5 g/dL — ABNORMAL LOW (ref 12.0–15.0)
MCH: 31.4 pg (ref 26.0–34.0)
MCHC: 32 g/dL (ref 30.0–36.0)
MCV: 98 fL (ref 80.0–100.0)
Platelets: 243 10*3/uL (ref 150–400)
RBC: 3.03 MIL/uL — ABNORMAL LOW (ref 3.87–5.11)
RDW: 14.9 % (ref 11.5–15.5)
WBC: 6.2 10*3/uL (ref 4.0–10.5)
nRBC: 0 % (ref 0.0–0.2)

## 2023-05-23 LAB — GLUCOSE, CAPILLARY
Glucose-Capillary: 136 mg/dL — ABNORMAL HIGH (ref 70–99)
Glucose-Capillary: 118 mg/dL — ABNORMAL HIGH (ref 70–99)

## 2023-05-23 LAB — MAGNESIUM: Magnesium: 2.2 mg/dL (ref 1.7–2.4)

## 2023-05-23 MED ORDER — ENOXAPARIN SODIUM 30 MG/0.3ML IJ SOSY
30.0000 mg | PREFILLED_SYRINGE | INTRAMUSCULAR | Status: DC
  Administered 2023-05-23 – 2023-05-25 (×3): 30 mg via SUBCUTANEOUS
  Filled 2023-05-23 (×2): qty 0.3

## 2023-05-23 MED ORDER — POTASSIUM CHLORIDE 20 MEQ PO PACK
40.0000 meq | PACK | Freq: Once | ORAL | Status: AC
  Administered 2023-05-23: 40 meq via ORAL
  Filled 2023-05-23: qty 2

## 2023-05-23 NOTE — Progress Notes (Signed)
PROGRESS NOTE    [name]  PIR:[id number] DOB: 12-27-1928 DOA: 05/21/2023 PCP: Shirline Frees, NP   Brief Narrative:  This 89 yrs old female with PMH significant for hypertension, hypothyroidism, Lewy body dementia who presented after being found slumped in her wheelchair and unable to speak.  Patient was seen in the ED on 10/10 after fall at her SNF. CT scan of the head and cervical spine did not note any acute abnormality.  Patient was discharged back to SNF.  She presented in the ED as a stroke code. CT imaging of the head was performed and did not note any acute abnormality.  CTA head showed no large vessel occlusion.  Neurology was consulted, recommended to have an MRI and EEG.  Patient was admitted for further evaluation.   Assessment & Plan:   Principal Problem:   Dysarthria Active Problems:   Weakness   Hypertensive urgency   Hypokalemia   Hypocalcemia   Dementia without behavioral disturbance (HCC)   Hypothyroidism   Anemia of chronic disease   CKD (chronic kidney disease) stage 3, GFR 30-59 ml/min (HCC)   Anxiety and depression   GERD (gastroesophageal reflux disease)   Acute encephalopathy   Generalized weakness and dysarthria: Patient presented with weakness and dysarthria. Normally patient is able to ambulate and communicate without issue.   Today she was found weak and slumped over in the chair.  She was also found to have difficulty with speech in the aftrenoon.   CT Head negative for acute abnormality. CT angiogram of the head did not note any large vessel occlusion.   Differential diagnosis TIA/stroke, seizure, infection,  uncontrolled blood pressures, or possibly progression of dementia. Neurochecks every 4 hours. EEG> No evidence of seizures. MRI brain> No acute abnormality found. Normal urine ruled out infectious cause for encephalopathy. It could be hypertensive encaphalopathy PT/OT to evaluate and treat Appreciate neurology consultation.    Hypertensive urgency: Blood pressures were noted to be elevated up to 220/74.  Resume home blood pressure medications including amlodipine and olmesartan. Continue hydralazine IV as needed. Blood pressure is improving.   Hypokalemia: Replaced.  Continue to monitor   Dementia: Continue Aricept 10 mg daily. Continue Namenda 10 mg daily. Continue Seroquel and Depakote. Delirium precautions  Hypothyroidism: Continue levothyroxine   Anemia of chronic disease: Hemoglobin 9.3 g/dL which appears around patient's baseline.   Continue to monitor   Chronic kidney disease stage IIIb: Serum creatinine at baseline. Continue to monitor.   Anxiety and depression: Continue Zoloft.   GERD: Continue Protonix.   DVT prophylaxis: Lovenox Code Status: DNR Family Communication:No family at bed side. Disposition Plan:   Status is: Inpatient Remains inpatient appropriate because:   Admitted for stroke workup and subclinical seizures.  MRI and EEG is negative. It could be hypertensive encephalopathy. PT/OT Pending.  Consultants:  Neurology  Procedures: MRI , EEG Antimicrobials:  Anti-infectives (From admission, onward)    None      Subjective: Patient was seen and examined at bedside.  Overnight events noted.   Patient appears slightly confused but following commands.  She is slow in response. She is able to move all 4 extremities.  Objective: Vitals:   05/22/23 1950 05/22/23 2336 05/23/23 0321 05/23/23 0813  BP: (!) 137/39 (!) 147/55 (!) 156/55 (!) 174/52  Pulse: 64 63 60 65  Resp: 18 18  18   Temp: 98.5 F (36.9 C) 97.8 F (36.6 C) 98.5 F (36.9 C) 98.7 F (37.1 C)  TempSrc: Oral Oral Oral Oral  SpO2: 93% 95% 94% 95%  Weight:       No intake or output data in the 24 hours ending 05/23/23 1154  Filed Weights   2023-05-31 1300  Weight: 66 kg   Examination:  General exam: Appears comfortable, deconditioned, not in any acute distress. Respiratory system: CTA  bilaterally. Respiratory effort normal.  RR 14 Cardiovascular system: S1 & S2 heard, RRR. No JVD, murmurs, rubs, gallops or clicks. Gastrointestinal system: Abdomen is non distended, soft and non tender. Normal bowel sounds heard. Central nervous system: Alert and oriented x 2. No focal neurological deficits. Extremities: No edema, no cyanosis, no clubbing Skin: No rashes, lesions or ulcers Psychiatry:  Mood & affect appropriate.   Data Reviewed: I have personally reviewed following labs and imaging studies  CBC: Recent Labs  Lab 05/31/23 1321 May 31, 2023 1325 05/22/23 0426 05/23/23 0953  WBC 6.1  --  6.6 6.2  NEUTROABS 4.1  --   --   --   HGB 9.3* 9.5* 10.4* 9.5*  HCT 30.1* 28.0* 32.8* 29.7*  MCV 99.3  --  96.2 98.0  PLT 213  --  229 243   Basic Metabolic Panel: Recent Labs  Lab 31-May-2023 1321 2023/05/31 1325 05/22/23 0426 05/23/23 0953  NA 141 144 142 137  K 3.1* 3.1* 3.0* 3.1*  CL 104 104 105 106  CO2 26  --  25 24  GLUCOSE 131* 132* 109* 110*  BUN 12 12 11 20   CREATININE 1.17* 1.40* 1.19* 1.63*  CALCIUM 8.7*  --  9.1 8.6*  MG  --   --   --  2.2  PHOS  --   --   --  3.5   GFR: Estimated Creatinine Clearance: 19.6 mL/min (A) (by C-G formula based on SCr of 1.63 mg/dL (H)). Liver Function Tests: Recent Labs  Lab 2023/05/31 1321  AST 16  ALT 11  ALKPHOS 60  BILITOT 0.4  PROT 6.1*  ALBUMIN 3.3*   No results for input(s): "LIPASE", "AMYLASE" in the last 168 hours. No results for input(s): "AMMONIA" in the last 168 hours. Coagulation Profile: Recent Labs  Lab 05-31-23 1321  INR 1.0   Cardiac Enzymes: No results for input(s): "CKTOTAL", "CKMB", "CKMBINDEX", "TROPONINI" in the last 168 hours. BNP (last 3 results) No results for input(s): "PROBNP" in the last 8760 hours. HbA1C: No results for input(s): "HGBA1C" in the last 72 hours. CBG: Recent Labs  Lab May 31, 2023 1316  GLUCAP 123*   Lipid Profile: Recent Labs    05/22/23 0426  CHOL 243*  HDL 40*   LDLCALC 170*  TRIG 163*  CHOLHDL 6.1   Thyroid Function Tests: Recent Labs    05-31-2023 2226 05/22/23 0426  TSH 19.892*  --   FREET4  --  0.67   Anemia Panel: Recent Labs    05/22/23 0920  VITAMINB12 458  FOLATE 17.1   Sepsis Labs: No results for input(s): "PROCALCITON", "LATICACIDVEN" in the last 168 hours.  No results found for this or any previous visit (from the past 240 hour(s)).   Radiology Studies: EEG adult  Result Date: 05-31-2023 Charlsie Quest, MD     31-May-2023  6:16 PM Patient Name: AIRIONNA FORTMAN MRN: [id number] Epilepsy Attending: Charlsie Quest Referring Physician/Provider: Marjorie Smolder, NP Date: 2023/05/31 Duration: 24.26 mins Patient history:  89 y.o. female with history of dementia with Lewy bodies, hypothyroidism and chronic kidney disease stage III who presents with acute onset speech disturbance and generalized weakness. EEG to evaluate  for seizure Level of alertness: Awake, asleep AEDs during EEG study: VPA Technical aspects: This EEG study was done with scalp electrodes positioned according to the 10-20 International system of electrode placement. Electrical activity was reviewed with band pass filter of 1-70Hz , sensitivity of 7 uV/mm, display speed of 36mm/sec with a 60Hz  notched filter applied as appropriate. EEG data were recorded continuously and digitally stored.  Video monitoring was available and reviewed as appropriate. Description: No clear posterior dominant rhythm was seen. Sleep was characterized by vertex waves, sleep spindles (12 to 14 Hz), maximal frontocentral region. EEG showed continuous generalized 3 to 6 Hz theta-delta slowing. Physiologic photic driving was not seen during photic stimulation. Hyperventilation was not performed.   ABNORMALITY - Continuous slow, generalized IMPRESSION: This study is suggestive of moderate diffuse encephalopathy. No seizures or epileptiform discharges were seen throughout the recording. Charlsie Quest   MR BRAIN WO CONTRAST  Result Date: 05/21/2023 CLINICAL DATA:  Delirium EXAM: MRI HEAD WITHOUT CONTRAST TECHNIQUE: Multiplanar, multiecho pulse sequences of the brain and surrounding structures were obtained without intravenous contrast. COMPARISON:  CT head May 21, 2023. FINDINGS: Brain: No acute infarction, hemorrhage, hydrocephalus, extra-axial collection or mass lesion. Vascular: Major arterial flow voids are maintained at the skull base. Skull and upper cervical spine: Normal marrow signal. Sinuses/Orbits: Mostly clear sinuses.  No acute orbital findings. Other: No mastoid effusions. IMPRESSION: Unremarkable brain MRI for patient age. No evidence of acute abnormality. Electronically Signed   By: Feliberto Harts M.D.   On: 05/21/2023 17:53   CT ANGIO HEAD NECK W WO CM (CODE STROKE)  Result Date: 05/21/2023 CLINICAL DATA:  Neuro deficit, acute, stroke suspected EXAM: CT ANGIOGRAPHY HEAD AND NECK WITH AND WITHOUT CONTRAST TECHNIQUE: Multidetector CT imaging of the head and neck was performed using the standard protocol during bolus administration of intravenous contrast. Multiplanar CT image reconstructions and MIPs were obtained to evaluate the vascular anatomy. Carotid stenosis measurements (when applicable) are obtained utilizing NASCET criteria, using the distal internal carotid diameter as the denominator. RADIATION DOSE REDUCTION: This exam was performed according to the departmental dose-optimization program which includes automated exposure control, adjustment of the mA and/or kV according to patient size and/or use of iterative reconstruction technique. CONTRAST:  75mL OMNIPAQUE IOHEXOL 350 MG/ML SOLN COMPARISON:  None Available. FINDINGS: CTA NECK FINDINGS Aortic arch: Great vessel origins are patent. Atherosclerosis of the aorta and great vessel origins. Right carotid system: Atherosclerosis at the carotid bifurcation without greater than 50% stenosis. Left carotid system:  Atherosclerosis at the carotid bifurcation involving the proximal ICA without greater than 50% stenosis. Vertebral arteries: Moderate bilateral vertebral artery origin stenosis. Moderate stenosis of the distal left V2 vertebral artery in a region of tortuosity. Skeleton: No acute abnormality on limited assessment. Multilevel ACDF. Other neck: No acute abnormality on limited assessment. Upper chest: Visualized lung apices are clear. Review of the MIP images confirms the above findings CTA HEAD FINDINGS Anterior circulation: Bilateral intracranial ICAs, MCAs, and ACAs are patent without proximal hemodynamically significant stenosis. Posterior circulation: Left lateral intradural vertebral arteries, basilar artery and bilateral posterior cerebral arteries are patent without proximal hemodynamically significant stenosis. Venous sinuses: As permitted by contrast timing, patent. Review of the MIP images confirms the above findings IMPRESSION: 1. No emergent large vessel occlusion. 2. Moderate stenosis of bilateral vertebral artery origins and the left V2 vertebral artery. 3.  Aortic Atherosclerosis (ICD10-I70.0). Electronically Signed   By: Feliberto Harts M.D.   On: 05/21/2023 14:04   CT HEAD  CODE STROKE WO CONTRAST  Result Date: 05/21/2023 CLINICAL DATA:  Code stroke.  Neuro deficit, acute, stroke suspected EXAM: CT HEAD WITHOUT CONTRAST TECHNIQUE: Contiguous axial images were obtained from the base of the skull through the vertex without intravenous contrast. RADIATION DOSE REDUCTION: This exam was performed according to the departmental dose-optimization program which includes automated exposure control, adjustment of the mA and/or kV according to patient size and/or use of iterative reconstruction technique. COMPARISON:  CT Head 05/20/23 FINDINGS: Brain: No evidence of acute infarction, hemorrhage, hydrocephalus, extra-axial collection or mass lesion/mass effect. Sequela of moderate chronic microvascular  ischemic change. Vascular: No hyperdense vessel or unexpected calcification. Skull: Normal. Negative for fracture or focal lesion. Sinuses/Orbits: No middle ear or mastoid effusion. Paranasal sinuses are clear. Bilateral lens replacement. Orbits are otherwise unremarkable. Other: None. ASPECTS ([place] Stroke Program Early CT Score): 10 IMPRESSION: 1. No hemorrhage or CT evidence of an acute cortical infarct. ASPECTS is 10. 2. Sequela of moderate chronic microvascular ischemic change. Findings were paged to Dr. Derry Lory on 05/21/2023 at 1:45 p.m. via [facility] paging system. Electronically Signed   By: Lorenza Cambridge M.D.   On: 05/21/2023 13:45    Scheduled Meds:  amLODipine  10 mg Oral Daily   divalproex  125 mg Oral BID   donepezil  10 mg Oral QHS   enoxaparin (LOVENOX) injection  40 mg Subcutaneous Q24H   ferrous sulfate  324 mg Oral Daily   irbesartan  75 mg Oral Daily   levothyroxine  150 mcg Oral QAC breakfast   memantine  10 mg Oral QHS   pantoprazole  40 mg Oral BID   potassium chloride  40 mEq Oral Once   QUEtiapine  50 mg Oral BID   sertraline  100 mg Oral BID   sodium chloride flush  3 mL Intravenous Q12H   Continuous Infusions:   LOS: 1 day    Time spent: 35 mins.    Willeen Niece, MD [facility]   If 7PM-7AM, please contact night-coverage

## 2023-05-24 DIAGNOSIS — R471 Dysarthria and anarthria: Secondary | ICD-10-CM | POA: Diagnosis not present

## 2023-05-24 LAB — GLUCOSE, CAPILLARY
Glucose-Capillary: 89 mg/dL (ref 70–99)
Glucose-Capillary: 110 mg/dL — ABNORMAL HIGH (ref 70–99)
Glucose-Capillary: 106 mg/dL — ABNORMAL HIGH (ref 70–99)

## 2023-05-24 LAB — BASIC METABOLIC PANEL
Anion gap: 9 (ref 5–15)
BUN: 24 mg/dL — ABNORMAL HIGH (ref 8–23)
CO2: 24 mmol/L (ref 22–32)
Calcium: 8.7 mg/dL — ABNORMAL LOW (ref 8.9–10.3)
Chloride: 107 mmol/L (ref 98–111)
Creatinine, Ser: 1.66 mg/dL — ABNORMAL HIGH (ref 0.44–1.00)
GFR, Estimated: 29 mL/min — ABNORMAL LOW (ref >60)
Glucose, Bld: 93 mg/dL (ref 70–99)
Potassium: 3.7 mmol/L (ref 3.5–5.1)
Sodium: 140 mmol/L (ref 135–145)

## 2023-05-24 LAB — CBC
HCT: 27.7 % — ABNORMAL LOW (ref 36.0–46.0)
Hemoglobin: 8.5 g/dL — ABNORMAL LOW (ref 12.0–15.0)
MCH: 30 pg (ref 26.0–34.0)
MCHC: 30.7 g/dL (ref 30.0–36.0)
MCV: 97.9 fL (ref 80.0–100.0)
Platelets: 207 10*3/uL (ref 150–400)
RBC: 2.83 MIL/uL — ABNORMAL LOW (ref 3.87–5.11)
RDW: 14.7 % (ref 11.5–15.5)
WBC: 5.7 10*3/uL (ref 4.0–10.5)
nRBC: 0 % (ref 0.0–0.2)

## 2023-05-24 LAB — MAGNESIUM: Magnesium: 2.1 mg/dL (ref 1.7–2.4)

## 2023-05-24 LAB — PHOSPHORUS: Phosphorus: 3.7 mg/dL (ref 2.5–4.6)

## 2023-05-24 MED ORDER — HYDRALAZINE HCL 25 MG PO TABS
25.0000 mg | ORAL_TABLET | Freq: Three times a day (TID) | ORAL | Status: DC
  Administered 2023-05-24 – 2023-05-25 (×5): 25 mg via ORAL
  Filled 2023-05-24 (×5): qty 1

## 2023-05-24 MED ORDER — FERROUS SULFATE 325 (65 FE) MG PO TABS
325.0000 mg | ORAL_TABLET | Freq: Every day | ORAL | Status: DC
  Administered 2023-05-24 – 2023-05-25 (×2): 325 mg via ORAL
  Filled 2023-05-24: qty 1

## 2023-05-24 NOTE — Plan of Care (Signed)
  Problem: Nutrition: Goal: Risk of aspiration will decrease Outcome: Progressing   Problem: Clinical Measurements: Goal: Will remain free from infection Outcome: Progressing Goal: Respiratory complications will improve Outcome: Progressing Goal: Cardiovascular complication will be avoided Outcome: Progressing   Problem: Activity: Goal: Risk for activity intolerance will decrease Outcome: Progressing   Problem: Nutrition: Goal: Adequate nutrition will be maintained Outcome: Progressing

## 2023-05-24 NOTE — Progress Notes (Signed)
PROGRESS NOTE    [name]  NGE:[id number] DOB: [birth year] DOA: 05/21/2023 PCP: Shirline Frees, NP   Brief Narrative:  This 89 yrs old female with PMH significant for hypertension, hypothyroidism, Lewy body dementia who presented after being found slumped in her wheelchair and unable to speak.  Patient was seen in the ED on 10/10 after fall at her SNF. CT scan of the head and cervical spine did not note any acute abnormality.  Patient was discharged back to SNF.  She presented in the ED as a stroke code. CT imaging of the head was performed and did not note any acute abnormality.  CTA head showed no large vessel occlusion.  Neurology was consulted, recommended to have an MRI and EEG.  Patient was admitted for further evaluation.   Assessment & Plan:   Principal Problem:   Dysarthria Active Problems:   Weakness   Hypertensive urgency   Hypokalemia   Hypocalcemia   Dementia without behavioral disturbance (HCC)   Hypothyroidism   Anemia of chronic disease   CKD (chronic kidney disease) stage 3, GFR 30-59 ml/min (HCC)   Anxiety and depression   GERD (gastroesophageal reflux disease)   Acute encephalopathy   Generalized weakness and dysarthria: Patient presented with weakness and dysarthria. Normally patient is able to ambulate and communicate without issue.   She was found weak and slumped over in the chair. he was also found to have difficulty with speech in the aftrenoon.   CT Head negative for acute abnormality. CT angiogram of the head did not note any large vessel occlusion.   Differential diagnosis TIA/stroke, seizure, infection,  uncontrolled blood pressures, or possibly progression of dementia. Neurochecks every 4 hours. EEG> No evidence of seizures. MRI brain> No acute abnormality found. Normal urine ruled out infectious cause for encephalopathy. It could be hypertensive encaphalopathy PT/OT to evaluate and treat Appreciate neurology consultation.   Hypertensive  urgency: Blood pressures were noted to be elevated up to 220/74.  Resume home blood pressure medications including amlodipine and olmesartan. Continue hydralazine IV as needed. Added hydralazine 25 mg every 8 hours for better blood pressure control. Blood pressure is improving.   Hypokalemia: Replaced.  Continue to monitor   Dementia: Continue Aricept 10 mg daily. Continue Namenda 10 mg daily. Continue Seroquel and Depakote. Delirium precautions  Hypothyroidism: Continue levothyroxine   Anemia of chronic disease: Hemoglobin 9.3 g/dL which appears around patient's baseline.   Continue to monitor   Chronic kidney disease stage IIIb: Serum creatinine at baseline. Continue to monitor.   Anxiety and depression: Continue Zoloft.   GERD: Continue Protonix.   DVT prophylaxis: Lovenox Code Status: DNR Family Communication: No family at bed side. Disposition Plan:   Status is: Inpatient Remains inpatient appropriate because:   Admitted for stroke workup and subclinical seizures.  MRI and EEG is negative. It could be hypertensive encephalopathy. PT/OT Pending.  Consultants:  Neurology  Procedures: MRI , EEG Antimicrobials:  Anti-infectives (From admission, onward)    None      Subjective: Patient was seen and examined at bedside.  Overnight events noted.   Patient appears slightly confused but following commands.  She is slow in response. She is able to move all 4 extremities.  Objective: Vitals:   05/23/23 1626 05/23/23 2131 05/24/23 0617 05/24/23 0816  BP: (!) 170/49 (!) 152/51 (!) 180/57 (!) 179/55  Pulse: 64 (!) 52 (!) 53 (!) 53  Resp:  14 16 16   Temp: 98.3 F (36.8 C) 98 F (36.7 C)  98.3 F (36.8 C) 98.2 F (36.8 C)  TempSrc:  Oral Oral Oral  SpO2: 96% 98% 98% 95%  Weight:       No intake or output data in the 24 hours ending 05/24/23 1131  Filed Weights   05/21/23 1300  Weight: 66 kg   Examination:  General exam: Appears comfortable,  deconditioned, not in any acute distress. Respiratory system: CTA bilaterally. Respiratory effort normal.  RR 12 Cardiovascular system: S1 & S2 heard, RRR. No JVD, murmurs, rubs, gallops or clicks. Gastrointestinal system: Abdomen is non distended, soft and non tender. Normal bowel sounds heard. Central nervous system: Alert and oriented x 2. No focal neurological deficits. Extremities: No edema, no cyanosis, no clubbing Skin: No rashes, lesions or ulcers Psychiatry:  Mood & affect appropriate.   Data Reviewed: I have personally reviewed following labs and imaging studies  CBC: Recent Labs  Lab 05/21/23 1321 05/21/23 1325 05/22/23 0426 05/23/23 0953 05/24/23 0416  WBC 6.1  --  6.6 6.2 5.7  NEUTROABS 4.1  --   --   --   --   HGB 9.3* 9.5* 10.4* 9.5* 8.5*  HCT 30.1* 28.0* 32.8* 29.7* 27.7*  MCV 99.3  --  96.2 98.0 97.9  PLT 213  --  229 243 207   Basic Metabolic Panel: Recent Labs  Lab 05/21/23 1321 05/21/23 1325 05/22/23 0426 05/23/23 0953 05/24/23 0416  NA 141 144 142 137 140  K 3.1* 3.1* 3.0* 3.1* 3.7  CL 104 104 105 106 107  CO2 26  --  25 24 24   GLUCOSE 131* 132* 109* 110* 93  BUN 12 12 11 20  24*  CREATININE 1.17* 1.40* 1.19* 1.63* 1.66*  CALCIUM 8.7*  --  9.1 8.6* 8.7*  MG  --   --   --  2.2 2.1  PHOS  --   --   --  3.5 3.7   GFR: Estimated Creatinine Clearance: 19.3 mL/min (A) (by C-G formula based on SCr of 1.66 mg/dL (H)). Liver Function Tests: Recent Labs  Lab 05/21/23 1321  AST 16  ALT 11  ALKPHOS 60  BILITOT 0.4  PROT 6.1*  ALBUMIN 3.3*   No results for input(s): "LIPASE", "AMYLASE" in the last 168 hours. No results for input(s): "AMMONIA" in the last 168 hours. Coagulation Profile: Recent Labs  Lab 05/21/23 1321  INR 1.0   Cardiac Enzymes: No results for input(s): "CKTOTAL", "CKMB", "CKMBINDEX", "TROPONINI" in the last 168 hours. BNP (last 3 results) No results for input(s): "PROBNP" in the last 8760 hours. HbA1C: No results for  input(s): "HGBA1C" in the last 72 hours. CBG: Recent Labs  Lab 05/21/23 1316 05/23/23 1627 05/23/23 2118 05/24/23 0623  GLUCAP 123* 136* 118* 89   Lipid Profile: Recent Labs    05/22/23 0426  CHOL 243*  HDL 40*  LDLCALC 170*  TRIG 163*  CHOLHDL 6.1   Thyroid Function Tests: Recent Labs    05/21/23 2226 05/22/23 0426  TSH 19.892*  --   FREET4  --  0.67   Anemia Panel: Recent Labs    05/22/23 0920  VITAMINB12 458  FOLATE 17.1   Sepsis Labs: No results for input(s): "PROCALCITON", "LATICACIDVEN" in the last 168 hours.  No results found for this or any previous visit (from the past 240 hour(s)).   Radiology Studies: No results found.  Scheduled Meds:  amLODipine  10 mg Oral Daily   divalproex  125 mg Oral BID   donepezil  10 mg Oral QHS  enoxaparin (LOVENOX) injection  30 mg Subcutaneous Q24H   ferrous sulfate  325 mg Oral Daily   hydrALAZINE  25 mg Oral Q8H   irbesartan  75 mg Oral Daily   levothyroxine  150 mcg Oral QAC breakfast   memantine  10 mg Oral QHS   pantoprazole  40 mg Oral BID   QUEtiapine  50 mg Oral BID   sertraline  100 mg Oral BID   sodium chloride flush  3 mL Intravenous Q12H   Continuous Infusions:   LOS: 2 days    Time spent: 35 mins.    Willeen Niece, MD [facility]   If 7PM-7AM, please contact night-coverage

## 2023-05-24 NOTE — Progress Notes (Signed)
Physical Therapy Treatment Patient Details Name: [name] MRN: [id number] DOB: 01-Jul-1929 Today's Date: 05/24/2023   History of Present Illness Pt is 89 yo presenting to [facility] ED for acute onset of speech disturbance and generalized weakness. Pt was seen in ER on 10/10 for fall at nursing facility where she hit her head. Neuro work up was unremarkable. PMH: anemia, depression, HTN, lewy body dementia, macular degeneration, hypothyroidism, CKD.    PT Comments  Pt making steady progress and was able to amb short distance in the room today with assistance. Continue to feel patient will benefit from continued inpatient follow up therapy, <3 hours/day     If plan is discharge home, recommend the following: A little help with walking and/or transfers;A lot of help with bathing/dressing/bathroom;Help with stairs or ramp for entrance;Assist for transportation   Can travel by private vehicle     No  Equipment Recommendations  None recommended by PT    Recommendations for Other Services       Precautions / Restrictions Precautions Precautions: Fall Restrictions Weight Bearing Restrictions: No     Mobility  Bed Mobility Overal bed mobility: Needs Assistance Bed Mobility: Supine to Sit     Supine to sit: Mod assist, HOB elevated     General bed mobility comments: Assist to bring legs off of bed, elevate trunk into sitting, and bring hips to EOB    Transfers Overall transfer level: Needs assistance Equipment used: Rolling walker (2 wheels) Transfers: Sit to/from Stand Sit to Stand: Mod assist, Min assist           General transfer comment: Assist to power up and for balance. Mod assist from bed initially and then min assist on 2nd stand from bed and from chair.    Ambulation/Gait Ambulation/Gait assistance: Min assist Gait Distance (Feet): 15 Feet Assistive device: Rolling walker (2 wheels) Gait Pattern/deviations: Step-to pattern, Decreased step length - left,  Decreased step length - right, Shuffle, Trunk flexed Gait velocity: decr Gait velocity interpretation: <1.31 ft/sec, indicative of household ambulator   General Gait Details: Assist for balance and support   Stairs             Wheelchair Mobility     Tilt Bed    Modified Rankin (Stroke Patients Only)       Balance Overall balance assessment: Needs assistance Sitting-balance support: Bilateral upper extremity supported, Feet supported Sitting balance-Leahy Scale: Poor Sitting balance - Comments: UE support and intermittent min assist due to posterior lean Postural control: Posterior lean Standing balance support: Bilateral upper extremity supported, Reliant on assistive device for balance Standing balance-Leahy Scale: Poor Standing balance comment: Walker and min assist for static standing                            Cognition Arousal: Alert Behavior During Therapy: Flat affect Overall Cognitive Status: History of cognitive impairments - at baseline                                          Exercises      General Comments        Pertinent Vitals/Pain Pain Assessment Pain Assessment: No/denies pain    Home Living                          Prior  Function            PT Goals (current goals can now be found in the care plan section) Progress towards PT goals: Progressing toward goals    Frequency    Min 1X/week      PT Plan      Co-evaluation              AM-PAC PT "6 Clicks" Mobility   Outcome Measure  Help needed turning from your back to your side while in a flat bed without using bedrails?: A Lot Help needed moving from lying on your back to sitting on the side of a flat bed without using bedrails?: A Lot Help needed moving to and from a bed to a chair (including a wheelchair)?: A Lot Help needed standing up from a chair using your arms (e.g., wheelchair or bedside chair)?: A Little Help needed  to walk in hospital room?: Total Help needed climbing 3-5 steps with a railing? : Total 6 Click Score: 11    End of Session Equipment Utilized During Treatment: Gait belt Activity Tolerance: Patient tolerated treatment well Patient left: in chair;with call bell/phone within reach;with chair alarm set Nurse Communication: Mobility status PT Visit Diagnosis: Other abnormalities of gait and mobility (R26.89);Muscle weakness (generalized) (M62.81)     Time: 3086-5784 PT Time Calculation (min) (ACUTE ONLY): 30 min  Charges:    $Gait Training: 23-37 mins PT General Charges $$ ACUTE PT VISIT: 1 Visit                     [facility] PT Acute Rehabilitation Services Office [phone]    Angelina Ok [facility] 05/24/2023, 1:59 PM

## 2023-05-24 NOTE — Plan of Care (Signed)
  Problem: Coping: Goal: Will verbalize positive feelings about self Outcome: Progressing   Problem: Health Behavior/Discharge Planning: Goal: Ability to manage health-related needs will improve Outcome: Progressing   Problem: Nutrition: Goal: Risk of aspiration will decrease 05/24/2023 1847 by Salley Hews, RN Outcome: Progressing 05/24/2023 1204 by Salley Hews, RN Outcome: Progressing

## 2023-05-25 DIAGNOSIS — F39 Unspecified mood [affective] disorder: Secondary | ICD-10-CM | POA: Diagnosis not present

## 2023-05-25 DIAGNOSIS — R0989 Other specified symptoms and signs involving the circulatory and respiratory systems: Secondary | ICD-10-CM | POA: Diagnosis not present

## 2023-05-25 DIAGNOSIS — I517 Cardiomegaly: Secondary | ICD-10-CM | POA: Diagnosis not present

## 2023-05-25 DIAGNOSIS — F03C2 Unspecified dementia, severe, with psychotic disturbance: Secondary | ICD-10-CM | POA: Diagnosis not present

## 2023-05-25 DIAGNOSIS — R531 Weakness: Secondary | ICD-10-CM | POA: Diagnosis not present

## 2023-05-25 DIAGNOSIS — I129 Hypertensive chronic kidney disease with stage 1 through stage 4 chronic kidney disease, or unspecified chronic kidney disease: Secondary | ICD-10-CM | POA: Diagnosis not present

## 2023-05-25 DIAGNOSIS — R42 Dizziness and giddiness: Secondary | ICD-10-CM | POA: Diagnosis not present

## 2023-05-25 DIAGNOSIS — L603 Nail dystrophy: Secondary | ICD-10-CM | POA: Diagnosis not present

## 2023-05-25 DIAGNOSIS — D649 Anemia, unspecified: Secondary | ICD-10-CM | POA: Diagnosis not present

## 2023-05-25 DIAGNOSIS — R0781 Pleurodynia: Secondary | ICD-10-CM | POA: Diagnosis not present

## 2023-05-25 DIAGNOSIS — I10 Essential (primary) hypertension: Secondary | ICD-10-CM | POA: Diagnosis not present

## 2023-05-25 DIAGNOSIS — R2681 Unsteadiness on feet: Secondary | ICD-10-CM | POA: Diagnosis not present

## 2023-05-25 DIAGNOSIS — R262 Difficulty in walking, not elsewhere classified: Secondary | ICD-10-CM | POA: Diagnosis not present

## 2023-05-25 DIAGNOSIS — G9340 Encephalopathy, unspecified: Secondary | ICD-10-CM | POA: Diagnosis not present

## 2023-05-25 DIAGNOSIS — F0390 Unspecified dementia without behavioral disturbance: Secondary | ICD-10-CM | POA: Diagnosis not present

## 2023-05-25 DIAGNOSIS — M4696 Unspecified inflammatory spondylopathy, lumbar region: Secondary | ICD-10-CM | POA: Diagnosis not present

## 2023-05-25 DIAGNOSIS — D631 Anemia in chronic kidney disease: Secondary | ICD-10-CM | POA: Diagnosis not present

## 2023-05-25 DIAGNOSIS — F32A Depression, unspecified: Secondary | ICD-10-CM | POA: Diagnosis not present

## 2023-05-25 DIAGNOSIS — Z7401 Bed confinement status: Secondary | ICD-10-CM | POA: Diagnosis not present

## 2023-05-25 DIAGNOSIS — Z741 Need for assistance with personal care: Secondary | ICD-10-CM | POA: Diagnosis not present

## 2023-05-25 DIAGNOSIS — F5101 Primary insomnia: Secondary | ICD-10-CM | POA: Diagnosis not present

## 2023-05-25 DIAGNOSIS — D509 Iron deficiency anemia, unspecified: Secondary | ICD-10-CM | POA: Diagnosis not present

## 2023-05-25 DIAGNOSIS — Z8669 Personal history of other diseases of the nervous system and sense organs: Secondary | ICD-10-CM | POA: Diagnosis not present

## 2023-05-25 DIAGNOSIS — M6250 Muscle wasting and atrophy, not elsewhere classified, unspecified site: Secondary | ICD-10-CM | POA: Diagnosis not present

## 2023-05-25 DIAGNOSIS — Z9181 History of falling: Secondary | ICD-10-CM | POA: Diagnosis not present

## 2023-05-25 DIAGNOSIS — F0280 Dementia in other diseases classified elsewhere without behavioral disturbance: Secondary | ICD-10-CM | POA: Diagnosis not present

## 2023-05-25 DIAGNOSIS — L602 Onychogryphosis: Secondary | ICD-10-CM | POA: Diagnosis not present

## 2023-05-25 DIAGNOSIS — H353133 Nonexudative age-related macular degeneration, bilateral, advanced atrophic without subfoveal involvement: Secondary | ICD-10-CM | POA: Diagnosis not present

## 2023-05-25 DIAGNOSIS — E039 Hypothyroidism, unspecified: Secondary | ICD-10-CM | POA: Diagnosis not present

## 2023-05-25 DIAGNOSIS — N200 Calculus of kidney: Secondary | ICD-10-CM | POA: Diagnosis not present

## 2023-05-25 DIAGNOSIS — R600 Localized edema: Secondary | ICD-10-CM | POA: Diagnosis not present

## 2023-05-25 DIAGNOSIS — R634 Abnormal weight loss: Secondary | ICD-10-CM | POA: Diagnosis not present

## 2023-05-25 DIAGNOSIS — N183 Chronic kidney disease, stage 3 unspecified: Secondary | ICD-10-CM | POA: Diagnosis not present

## 2023-05-25 DIAGNOSIS — R471 Dysarthria and anarthria: Secondary | ICD-10-CM | POA: Diagnosis not present

## 2023-05-25 DIAGNOSIS — D3132 Benign neoplasm of left choroid: Secondary | ICD-10-CM | POA: Diagnosis not present

## 2023-05-25 DIAGNOSIS — R2689 Other abnormalities of gait and mobility: Secondary | ICD-10-CM | POA: Diagnosis not present

## 2023-05-25 DIAGNOSIS — K644 Residual hemorrhoidal skin tags: Secondary | ICD-10-CM | POA: Diagnosis not present

## 2023-05-25 DIAGNOSIS — M1611 Unilateral primary osteoarthritis, right hip: Secondary | ICD-10-CM | POA: Diagnosis not present

## 2023-05-25 DIAGNOSIS — F0283 Dementia in other diseases classified elsewhere, unspecified severity, with mood disturbance: Secondary | ICD-10-CM | POA: Diagnosis not present

## 2023-05-25 DIAGNOSIS — R1311 Dysphagia, oral phase: Secondary | ICD-10-CM | POA: Diagnosis not present

## 2023-05-25 DIAGNOSIS — E876 Hypokalemia: Secondary | ICD-10-CM | POA: Diagnosis not present

## 2023-05-25 DIAGNOSIS — M6281 Muscle weakness (generalized): Secondary | ICD-10-CM | POA: Diagnosis not present

## 2023-05-25 DIAGNOSIS — I739 Peripheral vascular disease, unspecified: Secondary | ICD-10-CM | POA: Diagnosis not present

## 2023-05-25 DIAGNOSIS — R41841 Cognitive communication deficit: Secondary | ICD-10-CM | POA: Diagnosis not present

## 2023-05-25 DIAGNOSIS — M549 Dorsalgia, unspecified: Secondary | ICD-10-CM | POA: Diagnosis not present

## 2023-05-25 DIAGNOSIS — L84 Corns and callosities: Secondary | ICD-10-CM | POA: Diagnosis not present

## 2023-05-25 DIAGNOSIS — F02B3 Dementia in other diseases classified elsewhere, moderate, with mood disturbance: Secondary | ICD-10-CM | POA: Diagnosis not present

## 2023-05-25 DIAGNOSIS — W19XXXA Unspecified fall, initial encounter: Secondary | ICD-10-CM | POA: Diagnosis not present

## 2023-05-25 DIAGNOSIS — M25551 Pain in right hip: Secondary | ICD-10-CM | POA: Diagnosis not present

## 2023-05-25 DIAGNOSIS — G3183 Dementia with Lewy bodies: Secondary | ICD-10-CM | POA: Diagnosis not present

## 2023-05-25 LAB — CBC
HCT: 26.9 % — ABNORMAL LOW (ref 36.0–46.0)
Hemoglobin: 8.5 g/dL — ABNORMAL LOW (ref 12.0–15.0)
MCH: 31.3 pg (ref 26.0–34.0)
MCHC: 31.6 g/dL (ref 30.0–36.0)
MCV: 98.9 fL (ref 80.0–100.0)
Platelets: 188 10*3/uL (ref 150–400)
RBC: 2.72 MIL/uL — ABNORMAL LOW (ref 3.87–5.11)
RDW: 14.6 % (ref 11.5–15.5)
WBC: 5.1 10*3/uL (ref 4.0–10.5)
nRBC: 0 % (ref 0.0–0.2)

## 2023-05-25 LAB — BASIC METABOLIC PANEL
Anion gap: 9 (ref 5–15)
BUN: 23 mg/dL (ref 8–23)
CO2: 25 mmol/L (ref 22–32)
Calcium: 8.6 mg/dL — ABNORMAL LOW (ref 8.9–10.3)
Chloride: 106 mmol/L (ref 98–111)
Creatinine, Ser: 1.55 mg/dL — ABNORMAL HIGH (ref 0.44–1.00)
GFR, Estimated: 31 mL/min — ABNORMAL LOW (ref >60)
Glucose, Bld: 88 mg/dL (ref 70–99)
Potassium: 3.3 mmol/L — ABNORMAL LOW (ref 3.5–5.1)
Sodium: 140 mmol/L (ref 135–145)

## 2023-05-25 LAB — VITAMIN B1: Vitamin B1 (Thiamine): 105 nmol/L (ref 66.5–200.0)

## 2023-05-25 LAB — MAGNESIUM: Magnesium: 2.1 mg/dL (ref 1.7–2.4)

## 2023-05-25 LAB — PHOSPHORUS: Phosphorus: 3.9 mg/dL (ref 2.5–4.6)

## 2023-05-25 LAB — GLUCOSE, CAPILLARY: Glucose-Capillary: 97 mg/dL (ref 70–99)

## 2023-05-25 MED ORDER — HYDRALAZINE HCL 25 MG PO TABS: 25.0000 mg | ORAL_TABLET | Freq: Three times a day (TID) | ORAL | 0 refills | Status: DC

## 2023-05-25 MED ORDER — POTASSIUM CHLORIDE 20 MEQ PO PACK
40.0000 meq | PACK | Freq: Once | ORAL | Status: AC
  Administered 2023-05-25: 40 meq via ORAL
  Filled 2023-05-25: qty 2

## 2023-05-25 NOTE — TOC Transition Note (Signed)
Transition of Care [facility]) - CM/SW Discharge Note   Patient Details  Name: [name] MRN: [id number] Date of Birth: 18-Aug-1928  [facility]) CM/SW Contact:  Baldemar Lenis, LCSW Phone Number: 05/25/2023, 2:05 PM   Clinical Narrative:   CSW notified by MD that patient is stable to return to SNF. CSW sent discharge to [facility], confirmed they are ready for patient to return. CSW updated son, [name], he is in agreement. Transport arranged with [facility] for next available.  Nurse to call report to [phone], Rom [phone].    Final next level of care: Skilled Nursing Facility Barriers to Discharge: Barriers Resolved   Patient Goals and CMS Choice      Discharge Placement                Patient chooses bed at: [facility] Patient to be transferred to facility by: [facility] Name of family member notified: [name] Patient and family notified of of transfer: 05/25/23  Discharge Plan and Services Additional resources added to the After Visit Summary for       Post Acute Care Choice: Skilled Nursing Facility                               Social Determinants of Health (SDOH) Interventions SDOH Screenings   Food Insecurity: No Food Insecurity (06/05/2022)  Housing: Low Risk  (09/25/2020)  Transportation Needs: No Transportation Needs (06/05/2022)  Alcohol Screen: Low Risk  (09/25/2020)  Depression (PHQ2-9): Low Risk  (06/05/2022)  Financial Resource Strain: Low Risk  (06/05/2022)  Physical Activity: Inactive (06/05/2022)  Social Connections: Moderately Isolated (09/25/2020)  Stress: Stress Concern Present (06/05/2022)  Tobacco Use: Medium Risk (05/20/2023)     Readmission Risk Interventions     No data to display

## 2023-05-25 NOTE — Discharge Summary (Signed)
Physician Discharge Summary  IBBIE MERE ZOX:[id number] DOB: 1928/08/28 DOA: 05/21/2023  PCP: Shirline Frees, NP  Admit date: 05/21/2023  Discharge date: 05/25/2023  Admitted From: Home  Disposition:  SNF ([facility])  Recommendations for Outpatient Follow-up:  Follow up with PCP in 1-2 weeks. Please obtain BMP/CBC in one week. Advised to continue current medications as prescribed. Patient being discharged to skilled nursing facility for rehab  Home Health: None Equipment/Devices: None  Discharge Condition: Stable CODE STATUS: DNR Diet recommendation: Heart Healthy   Brief [facility] Course: This 89 yrs old female with PMH significant for hypertension, hypothyroidism, Lewy body dementia who presented in the ED after being found slumped in her wheelchair and was unable to speak.  Patient was seen in the ED on 10/10 after fall at her SNF. CT scan of the head and cervical spine did not note any acute abnormality. Patient was discharged back to SNF.  She presented in the ED as a stroke code this time. CT imaging of the head was performed and did not note any acute abnormality. CTA head showed no large vessel occlusion.  Neurology was consulted, recommended to have an MRI and EEG.  Patient was admitted for further evaluation.  Patient had MRI which did not show any acute abnormality.  EEG > No evidence of seizures.  Neurologist think patient is having metabolic encephalopathy,  could be from UTI which was ruled out.  Patient blood pressure remains high,  it could be secondary to hypertension.  Patient is started on blood pressure medications and has improved.  Neurology signed off,  recommended patient can follow-up outpatient with neurology.  Patient being discharged back to skilled nursing facility.  Discharge Diagnoses:  Principal Problem:   Dysarthria Active Problems:   Weakness   Hypertensive urgency   Hypokalemia   Hypocalcemia   Dementia without behavioral disturbance  (HCC)   Hypothyroidism   Anemia of chronic disease   CKD (chronic kidney disease) stage 3, GFR 30-59 ml/min (HCC)   Anxiety and depression   GERD (gastroesophageal reflux disease)   Acute encephalopathy  Generalized weakness and dysarthria: Patient presented with weakness and dysarthria. Normally patient is able to ambulate and communicate without issue.   She was found weak and slumped over in the chair. She was also found to have difficulty with speech in the aftrenoon.   CT Head negative for acute abnormality. CT angiogram of the head did not note any large vessel occlusion.   Differential diagnosis TIA/stroke, seizure, infection,  uncontrolled blood pressures, or possibly progression of dementia. Neurochecks every 4 hours. EEG> No evidence of seizures. MRI brain> No acute abnormality found. Normal urine ruled out infectious cause for encephalopathy. It could be hypertensive encephalopathy. PT/OT recommended skilled nursing facility. Appreciate neurology consultation.  Neurology signed off.   Hypertensive urgency: Blood pressures were noted to be elevated up to 220/74.  Resume home blood pressure medications including amlodipine and olmesartan. Continue hydralazine IV as needed. Added hydralazine 25 mg every 8 hours for better blood pressure control. Blood pressure is improving.   Hypokalemia: Replaced.  Continue to monitor   Dementia: Continue Aricept 10 mg daily. Continue Namenda 10 mg daily. Continue Seroquel and Depakote. Delirium precautions   Hypothyroidism: Continue levothyroxine.   Anemia of chronic disease: Hemoglobin 9.3 g/dL which appears around patient's baseline.   Continue to monitor.   Chronic kidney disease stage IIIb: Serum creatinine at baseline. Continue to monitor.   Anxiety and depression: Continue Zoloft.   GERD: Continue Protonix.  Discharge Instructions  Discharge Instructions     Call MD for:  difficulty breathing, headache or  visual disturbances   Complete by: As directed    Call MD for:  persistant dizziness or light-headedness   Complete by: As directed    Call MD for:  persistant nausea and vomiting   Complete by: As directed    Diet - low sodium heart healthy   Complete by: As directed    Diet Carb Modified   Complete by: As directed    Discharge instructions   Complete by: As directed    Advised to follow-up with primary care physician in 1 week. Advised to continue current medications as prescribed. Patient being discharged to skilled nursing facility for rehab   Increase activity slowly   Complete by: As directed       Allergies as of 05/25/2023       Reactions   Oxycodone Hcl Itching        Medication List     TAKE these medications    amLODipine 10 MG tablet Commonly known as: NORVASC TAKE 1 TABLET BY MOUTH DAILY What changed: how much to take   Artificial Tears 1.4 % ophthalmic solution Generic drug: polyvinyl alcohol Place 1 drop into both eyes 3 (three) times daily as needed for dry eyes.   divalproex 125 MG capsule Commonly known as: DEPAKOTE SPRINKLE Take 125 mg by mouth 2 (two) times daily.   docusate sodium 100 MG capsule Commonly known as: COLACE Take 1 capsule (100 mg total) by mouth 2 (two) times daily. What changed:  when to take this reasons to take this   donepezil 10 MG tablet Commonly known as: ARICEPT Take 1 tablet (10 mg total) by mouth at bedtime.   ferrous sulfate 324 MG Tbec Take 324 mg by mouth daily.   hydrALAZINE 25 MG tablet Commonly known as: APRESOLINE Take 1 tablet (25 mg total) by mouth every 8 (eight) hours.   hydrocortisone 25 MG suppository Commonly known as: ANUSOL-HC Place 25 mg rectally 2 (two) times daily.   levothyroxine 150 MCG tablet Commonly known as: SYNTHROID Take 1 tablet (150 mcg total) by mouth daily before breakfast.   memantine 10 MG tablet Commonly known as: NAMENDA TAKE 1 TABLET BY MOUTH IN THE  MORNING AND  AT BEDTIME What changed: See the new instructions.   olmesartan 20 MG tablet Commonly known as: BENICAR Take 0.5 tablets (10 mg total) by mouth daily.   omeprazole 40 MG capsule Commonly known as: PRILOSEC TAKE 1 CAPSULE BY MOUTH DAILY   ondansetron 4 MG disintegrating tablet Commonly known as: ZOFRAN-ODT Take 4 mg by mouth every 8 (eight) hours as needed for nausea.   pantoprazole 40 MG tablet Commonly known as: PROTONIX Take 40 mg by mouth 2 (two) times daily.   polyethylene glycol 17 g packet Commonly known as: MIRALAX / GLYCOLAX Take 17 g by mouth daily as needed for mild constipation.   PRESERVISION AREDS 2 PO Take 1 capsule by mouth daily with breakfast.   QUEtiapine 50 MG tablet Commonly known as: SEROQUEL TAKE 1 TABLET BY MOUTH TWICE  DAILY   Rexulti 0.5 MG Tabs Generic drug: Brexpiprazole TAKE 1 TABLET BY MOUTH AT NIGHT   sertraline 100 MG tablet Commonly known as: Zoloft Take 1 tablet (100 mg total) by mouth in the morning and at bedtime.   TYLENOL 500 MG tablet Generic drug: acetaminophen Take 1,000 mg by mouth in the morning and at bedtime.  Contact information for follow-up providers     Nafziger, Kandee Keen, NP Follow up in 1 week(s).   Specialty: Family Medicine Contact information: [address] [place] [address] [phone]              Contact information for after-discharge care     Destination     [facility], [facility] .   Service: Skilled Nursing Contact information: [address] [place] [address] [phone]                    Allergies  Allergen Reactions   Oxycodone Hcl Itching    Consultations: Neurology   Procedures/Studies: EEG adult  Result Date: May 23, 2023 Charlsie Quest, MD     May 23, 2023  6:16 PM Patient Name: [name] MRN: [id number] Epilepsy Attending: Charlsie Quest Referring Physician/Provider: Marjorie Smolder, NP Date: 05/23/2023 Duration: 24.26 mins Patient history:  89 y.o. female with history of dementia with Lewy bodies, hypothyroidism and chronic kidney disease stage III who presents with acute onset speech disturbance and generalized weakness. EEG to evaluate for seizure Level of alertness: Awake, asleep AEDs during EEG study: VPA Technical aspects: This EEG study was done with scalp electrodes positioned according to the 10-20 International system of electrode placement. Electrical activity was reviewed with band pass filter of 1-70Hz , sensitivity of 7 uV/mm, display speed of 67mm/sec with a 60Hz  notched filter applied as appropriate. EEG data were recorded continuously and digitally stored.  Video monitoring was available and reviewed as appropriate. Description: No clear posterior dominant rhythm was seen. Sleep was characterized by vertex waves, sleep spindles (12 to 14 Hz), maximal frontocentral region. EEG showed continuous generalized 3 to 6 Hz theta-delta slowing. Physiologic photic driving was not seen during photic stimulation. Hyperventilation was not performed.   ABNORMALITY - Continuous slow, generalized IMPRESSION: This study is suggestive of moderate diffuse encephalopathy. No seizures or epileptiform discharges were seen throughout the recording. Charlsie Quest   MR BRAIN WO CONTRAST  Result Date: 2023-05-23 CLINICAL DATA:  Delirium EXAM: MRI HEAD WITHOUT CONTRAST TECHNIQUE: Multiplanar, multiecho pulse sequences of the brain and surrounding structures were obtained without intravenous contrast. COMPARISON:  CT head May 23, 2023. FINDINGS: Brain: No acute infarction, hemorrhage, hydrocephalus, extra-axial collection or mass lesion. Vascular: Major arterial flow voids are maintained at the skull base. Skull and upper cervical spine: Normal marrow signal. Sinuses/Orbits: Mostly clear sinuses.  No acute orbital findings. Other: No mastoid effusions. IMPRESSION: Unremarkable brain MRI for  patient age. No evidence of acute abnormality. Electronically Signed   By: Feliberto Harts M.D.   On: 2023-05-23 17:53   CT ANGIO HEAD NECK W WO CM (CODE STROKE)  Result Date: 2023-05-23 CLINICAL DATA:  Neuro deficit, acute, stroke suspected EXAM: CT ANGIOGRAPHY HEAD AND NECK WITH AND WITHOUT CONTRAST TECHNIQUE: Multidetector CT imaging of the head and neck was performed using the standard protocol during bolus administration of intravenous contrast. Multiplanar CT image reconstructions and MIPs were obtained to evaluate the vascular anatomy. Carotid stenosis measurements (when applicable) are obtained utilizing NASCET criteria, using the distal internal carotid diameter as the denominator. RADIATION DOSE REDUCTION: This exam was performed according to the departmental dose-optimization program which includes automated exposure control, adjustment of the mA and/or kV according to patient size and/or use of iterative reconstruction technique. CONTRAST:  75mL OMNIPAQUE IOHEXOL 350 MG/ML SOLN COMPARISON:  None Available. FINDINGS: CTA NECK FINDINGS Aortic arch: Great vessel origins are  patent. Atherosclerosis of the aorta and great vessel origins. Right carotid system: Atherosclerosis at the carotid bifurcation without greater than 50% stenosis. Left carotid system: Atherosclerosis at the carotid bifurcation involving the proximal ICA without greater than 50% stenosis. Vertebral arteries: Moderate bilateral vertebral artery origin stenosis. Moderate stenosis of the distal left V2 vertebral artery in a region of tortuosity. Skeleton: No acute abnormality on limited assessment. Multilevel ACDF. Other neck: No acute abnormality on limited assessment. Upper chest: Visualized lung apices are clear. Review of the MIP images confirms the above findings CTA HEAD FINDINGS Anterior circulation: Bilateral intracranial ICAs, MCAs, and ACAs are patent without proximal hemodynamically significant stenosis. Posterior  circulation: Left lateral intradural vertebral arteries, basilar artery and bilateral posterior cerebral arteries are patent without proximal hemodynamically significant stenosis. Venous sinuses: As permitted by contrast timing, patent. Review of the MIP images confirms the above findings IMPRESSION: 1. No emergent large vessel occlusion. 2. Moderate stenosis of bilateral vertebral artery origins and the left V2 vertebral artery. 3.  Aortic Atherosclerosis (ICD10-I70.0). Electronically Signed   By: Feliberto Harts M.D.   On: 05/21/2023 14:04   CT HEAD CODE STROKE WO CONTRAST  Result Date: 05/21/2023 CLINICAL DATA:  Code stroke.  Neuro deficit, acute, stroke suspected EXAM: CT HEAD WITHOUT CONTRAST TECHNIQUE: Contiguous axial images were obtained from the base of the skull through the vertex without intravenous contrast. RADIATION DOSE REDUCTION: This exam was performed according to the departmental dose-optimization program which includes automated exposure control, adjustment of the mA and/or kV according to patient size and/or use of iterative reconstruction technique. COMPARISON:  CT Head 05/20/23 FINDINGS: Brain: No evidence of acute infarction, hemorrhage, hydrocephalus, extra-axial collection or mass lesion/mass effect. Sequela of moderate chronic microvascular ischemic change. Vascular: No hyperdense vessel or unexpected calcification. Skull: Normal. Negative for fracture or focal lesion. Sinuses/Orbits: No middle ear or mastoid effusion. Paranasal sinuses are clear. Bilateral lens replacement. Orbits are otherwise unremarkable. Other: None. ASPECTS ([place] Stroke Program Early CT Score): 10 IMPRESSION: 1. No hemorrhage or CT evidence of an acute cortical infarct. ASPECTS is 10. 2. Sequela of moderate chronic microvascular ischemic change. Findings were paged to Dr. Derry Lory on 05/21/2023 at 1:45 p.m. via [facility] paging system. Electronically Signed   By: Lorenza Cambridge M.D.   On: 05/21/2023 13:45    DG Pelvis 1-2 Views  Result Date: 05/20/2023 CLINICAL DATA:  Fall EXAM: PELVIS - 1-2 VIEW COMPARISON:  CT 12/09/2022 FINDINGS: SI joints are non widened. Pubic symphysis appears intact. Questionable right superior pubic ramus fracture. The femoral heads project in joint. IMPRESSION: Questionable right superior pubic ramus fracture. Electronically Signed   By: Jasmine Pang M.D.   On: 05/20/2023 21:06   DG Elbow 2 Views Left  Result Date: 05/20/2023 CLINICAL DATA:  Fall with bruising EXAM: LEFT ELBOW - 2 VIEW COMPARISON:  12/10/2022 FINDINGS: There is no evidence of fracture, dislocation, or joint effusion. There is no evidence of arthropathy or other focal bone abnormality. Soft tissues are unremarkable. IMPRESSION: Negative. Electronically Signed   By: Jasmine Pang M.D.   On: 05/20/2023 21:04   DG Chest Portable 1 View  Result Date: 05/20/2023 CLINICAL DATA:  Fall EXAM: PORTABLE CHEST 1 VIEW COMPARISON:  CT 12/09/2022, chest x-ray 12/17/2016 FINDINGS: Hardware in the cervical spine. Mildly diminished lung volume. Cardiac enlargement. Aortic atherosclerosis. Upper normal mediastinal with likely augmented by portable technique and low lung volume. No acute airspace disease, pleural effusion, or pneumothorax IMPRESSION: Low lung volumes.  Cardiomegaly. Electronically Signed  By: Jasmine Pang M.D.   On: 05/20/2023 21:03   CT Head Wo Contrast  Result Date: 05/20/2023 CLINICAL DATA:  Patient fell and hit head. Skin tear to left elbow and bruising to upper arm. Dementia. EXAM: CT HEAD WITHOUT CONTRAST TECHNIQUE: Contiguous axial images were obtained from the base of the skull through the vertex without intravenous contrast. RADIATION DOSE REDUCTION: This exam was performed according to the departmental dose-optimization program which includes automated exposure control, adjustment of the mA and/or kV according to patient size and/or use of iterative reconstruction technique. COMPARISON:  CT head  and cervical spine 12/09/2022 FINDINGS: CT HEAD FINDINGS Brain: No intracranial hemorrhage, mass effect, or evidence of acute infarct. No hydrocephalus. No extra-axial fluid collection. Age related cerebral atrophy and chronic small vessel ischemic disease. Vascular: No hyperdense vessel. Intracranial arterial calcification. Skull: No fracture or focal lesion. Sinuses/Orbits: No acute finding. Other: None. CT CERVICAL SPINE FINDINGS Alignment: No evidence of traumatic malalignment. Skull base and vertebrae: No acute fracture. No primary bone lesion or focal pathologic process. Soft tissues and spinal canal: No prevertebral fluid or swelling. No visible canal hematoma. Disc levels: ACDF C4-C5 and C6-C7. No significant spinal canal. Multilevel facet arthropathy. Upper chest: No acute abnormality. Other: Carotid calcification. IMPRESSION: 1. No acute intracranial abnormality. 2. No acute fracture in the cervical spine. Electronically Signed   By: Minerva Fester M.D.   On: 05/20/2023 21:00   CT Cervical Spine Wo Contrast  Result Date: 05/20/2023 CLINICAL DATA:  Patient fell and hit head. Skin tear to left elbow and bruising to upper arm. Dementia. EXAM: CT HEAD WITHOUT CONTRAST TECHNIQUE: Contiguous axial images were obtained from the base of the skull through the vertex without intravenous contrast. RADIATION DOSE REDUCTION: This exam was performed according to the departmental dose-optimization program which includes automated exposure control, adjustment of the mA and/or kV according to patient size and/or use of iterative reconstruction technique. COMPARISON:  CT head and cervical spine 12/09/2022 FINDINGS: CT HEAD FINDINGS Brain: No intracranial hemorrhage, mass effect, or evidence of acute infarct. No hydrocephalus. No extra-axial fluid collection. Age related cerebral atrophy and chronic small vessel ischemic disease. Vascular: No hyperdense vessel. Intracranial arterial calcification. Skull: No fracture or  focal lesion. Sinuses/Orbits: No acute finding. Other: None. CT CERVICAL SPINE FINDINGS Alignment: No evidence of traumatic malalignment. Skull base and vertebrae: No acute fracture. No primary bone lesion or focal pathologic process. Soft tissues and spinal canal: No prevertebral fluid or swelling. No visible canal hematoma. Disc levels: ACDF C4-C5 and C6-C7. No significant spinal canal. Multilevel facet arthropathy. Upper chest: No acute abnormality. Other: Carotid calcification. IMPRESSION: 1. No acute intracranial abnormality. 2. No acute fracture in the cervical spine. Electronically Signed   By: Minerva Fester M.D.   On: 05/20/2023 21:00     Subjective: Patient was seen and examined at bedside. Overnight events noted.   Patient seems much improved.  She appears back to her baseline.   Patient being discharged to skilled nursing facility.  Discharge Exam: Vitals:   05/25/23 0808 05/25/23 1132  BP: (!) 166/47 (!) 156/52  Pulse: (!) 54 (!) 57  Resp: 17 18  Temp: 98.1 F (36.7 C) 97.7 F (36.5 C)  SpO2: 95% 96%   Vitals:   05/25/23 0026 05/25/23 0406 05/25/23 0808 05/25/23 1132  BP: (!) 136/52 (!) 170/46 (!) 166/47 (!) 156/52  Pulse: (!) 57 (!) 55 (!) 54 (!) 57  Resp: 18 18 17 18   Temp: (!) 97.2 F (36.2 C)  98 F (36.7 C) 98.1 F (36.7 C) 97.7 F (36.5 C)  TempSrc:   Oral Oral  SpO2: 96% 95% 95% 96%  Weight:        General: Pt is alert, awake, not in acute distress Cardiovascular: RRR, S1/S2 +, no rubs, no gallops Respiratory: CTA bilaterally, no wheezing, no rhonchi Abdominal: Soft, NT, ND, bowel sounds + Extremities: no edema, no cyanosis    The results of significant diagnostics from this hospitalization (including imaging, microbiology, ancillary and laboratory) are listed below for reference.     Microbiology: No results found for this or any previous visit (from the past 240 hour(s)).   Labs: BNP (last 3 results) No results for input(s): "BNP" in the last  8760 hours. Basic Metabolic Panel: Recent Labs  Lab 05/21/23 1321 05/21/23 1325 05/22/23 0426 05/23/23 0953 05/24/23 0416 05/25/23 0448  NA 141 144 142 137 140 140  K 3.1* 3.1* 3.0* 3.1* 3.7 3.3*  CL 104 104 105 106 107 106  CO2 26  --  25 24 24 25   GLUCOSE 131* 132* 109* 110* 93 88  BUN 12 12 11 20  24* 23  CREATININE 1.17* 1.40* 1.19* 1.63* 1.66* 1.55*  CALCIUM 8.7*  --  9.1 8.6* 8.7* 8.6*  MG  --   --   --  2.2 2.1 2.1  PHOS  --   --   --  3.5 3.7 3.9   Liver Function Tests: Recent Labs  Lab 05/21/23 1321  AST 16  ALT 11  ALKPHOS 60  BILITOT 0.4  PROT 6.1*  ALBUMIN 3.3*   No results for input(s): "LIPASE", "AMYLASE" in the last 168 hours. No results for input(s): "AMMONIA" in the last 168 hours. CBC: Recent Labs  Lab 05/21/23 1321 05/21/23 1325 05/22/23 0426 05/23/23 0953 05/24/23 0416 05/25/23 0448  WBC 6.1  --  6.6 6.2 5.7 5.1  NEUTROABS 4.1  --   --   --   --   --   HGB 9.3* 9.5* 10.4* 9.5* 8.5* 8.5*  HCT 30.1* 28.0* 32.8* 29.7* 27.7* 26.9*  MCV 99.3  --  96.2 98.0 97.9 98.9  PLT 213  --  229 243 207 188   Cardiac Enzymes: No results for input(s): "CKTOTAL", "CKMB", "CKMBINDEX", "TROPONINI" in the last 168 hours. BNP: Invalid input(s): "POCBNP" CBG: Recent Labs  Lab 05/23/23 2118 05/24/23 0623 05/24/23 1135 05/24/23 2117 05/25/23 0614  GLUCAP 118* 89 110* 106* 97   D-Dimer No results for input(s): "DDIMER" in the last 72 hours. Hgb A1c No results for input(s): "HGBA1C" in the last 72 hours. Lipid Profile No results for input(s): "CHOL", "HDL", "LDLCALC", "TRIG", "CHOLHDL", "LDLDIRECT" in the last 72 hours. Thyroid function studies No results for input(s): "TSH", "T4TOTAL", "T3FREE", "THYROIDAB" in the last 72 hours.  Invalid input(s): "FREET3" Anemia work up No results for input(s): "VITAMINB12", "FOLATE", "FERRITIN", "TIBC", "IRON", "RETICCTPCT" in the last 72 hours. Urinalysis    Component Value Date/Time   COLORURINE YELLOW  05/22/2023 1047   APPEARANCEUR CLEAR 05/22/2023 1047   LABSPEC >1.046 (H) 05/22/2023 1047   PHURINE 6.0 05/22/2023 1047   GLUCOSEU NEGATIVE 05/22/2023 1047   GLUCOSEU NEGATIVE 07/08/2021 1146   HGBUR NEGATIVE 05/22/2023 1047   BILIRUBINUR NEGATIVE 05/22/2023 1047   BILIRUBINUR neg 09/22/2022 1648   KETONESUR NEGATIVE 05/22/2023 1047   PROTEINUR >=300 (A) 05/22/2023 1047   UROBILINOGEN 0.2 09/22/2022 1648   UROBILINOGEN 0.2 07/08/2021 1146   NITRITE NEGATIVE 05/22/2023 1047   LEUKOCYTESUR NEGATIVE 05/22/2023 1047  Sepsis Labs Recent Labs  Lab 05/22/23 0426 05/23/23 0953 05/24/23 0416 05/25/23 0448  WBC 6.6 6.2 5.7 5.1   Microbiology No results found for this or any previous visit (from the past 240 hour(s)).   Time coordinating discharge: Over 30 minutes  SIGNED:   Willeen Niece, MD  [facility] 05/25/2023, 12:39 PM Pager   If 7PM-7AM, please contact night-coverage

## 2023-05-25 NOTE — Progress Notes (Signed)
Occupational Therapy Treatment Patient Details Name: [name] MRN: [id number] DOB: Apr 22, 1929 Today's Date: 05/25/2023   History of present illness Pt is 89 yo presenting to [facility] ED for acute onset of speech disturbance and generalized weakness. Pt was seen in ER on 10/10 for fall at nursing facility where she hit her head. Neuro work up was unremarkable. PMH: anemia, depression, HTN, lewy body dementia, macular degeneration, hypothyroidism, CKD.   OT comments  Pt progressing towards goals, able to perform standing grooming tasks at sink, seated UB/LB bathing, and toilet transfer this session. Pt mod A for bathing task, and perseverative on washing under bil breasts states she feels "itchy", RN notified. Pt min A for short distance ambulation in room, cues to keep RW close to body. Pt presenting with impairments listed below, will follow acutely. Patient will benefit from continued inpatient follow up therapy, <3 hours/day to maximize safety/ind with ADLs/functional mobility.       If plan is discharge home, recommend the following:  A lot of help with walking and/or transfers;A lot of help with bathing/dressing/bathroom;Assistance with cooking/housework;Direct supervision/assist for medications management;Direct supervision/assist for financial management;Assist for transportation;Help with stairs or ramp for entrance;Supervision due to cognitive status   Equipment Recommendations  Other (comment) (defer)    Recommendations for Other Services PT consult    Precautions / Restrictions Precautions Precautions: Fall Restrictions Weight Bearing Restrictions: No       Mobility Bed Mobility               General bed mobility comments: in bathroom with RN upon arrival    Transfers Overall transfer level: Needs assistance Equipment used: Rolling walker (2 wheels) Transfers: Sit to/from Stand Sit to Stand: Min assist                 Balance Overall balance assessment:  Needs assistance Sitting-balance support: Bilateral upper extremity supported, Feet supported Sitting balance-Leahy Scale: Good   Postural control: Posterior lean Standing balance support: Bilateral upper extremity supported, Reliant on assistive device for balance Standing balance-Leahy Scale: Poor Standing balance comment: reliant on RW support                           ADL either performed or assessed with clinical judgement   ADL Overall ADL's : Needs assistance/impaired     Grooming: Set up;Standing   Upper Body Bathing: Moderate assistance;Sitting Upper Body Bathing Details (indicate cue type and reason): assist to wash back Lower Body Bathing: Moderate assistance;Sitting/lateral leans Lower Body Bathing Details (indicate cue type and reason): washes upper thighs and calves         Toilet Transfer: Minimal assistance;Ambulation;Regular Toilet;Rolling walker (2 wheels)           Functional mobility during ADLs: Minimal assistance;Rolling walker (2 wheels)      Extremity/Trunk Assessment Upper Extremity Assessment Upper Extremity Assessment: Generalized weakness   Lower Extremity Assessment Lower Extremity Assessment: Defer to PT evaluation        Vision   Vision Assessment?: No apparent visual deficits   Perception Perception Perception: Not tested   Praxis Praxis Praxis: Not tested    Cognition Arousal: Alert Behavior During Therapy: Flat affect Overall Cognitive Status: History of cognitive impairments - at baseline                                 General Comments: hx of dementia,  oriented to self and DOB,        Exercises      Shoulder Instructions       General Comments VSS    Pertinent Vitals/ Pain       Pain Assessment Pain Assessment: No/denies pain  Home Living                                          Prior Functioning/Environment              Frequency  Min 1X/week         Progress Toward Goals  OT Goals(current goals can now be found in the care plan section)  Progress towards OT goals: Progressing toward goals  Acute Rehab OT Goals Patient Stated Goal: none stated OT Goal Formulation: Patient unable to participate in goal setting Time For Goal Achievement: 06/05/23 Potential to Achieve Goals: Good ADL Goals Pt Will Perform Grooming: with supervision;standing Pt Will Perform Upper Body Dressing: with supervision;sitting Pt Will Perform Lower Body Dressing: with supervision;sit to/from stand;sitting/lateral leans Pt Will Transfer to Toilet: with supervision;ambulating;regular height toilet  Plan      Co-evaluation                 AM-PAC OT "6 Clicks" Daily Activity     Outcome Measure   Help from another person eating meals?: None Help from another person taking care of personal grooming?: A Little Help from another person toileting, which includes using toliet, bedpan, or urinal?: A Lot Help from another person bathing (including washing, rinsing, drying)?: A Lot Help from another person to put on and taking off regular upper body clothing?: A Little Help from another person to put on and taking off regular lower body clothing?: A Lot 6 Click Score: 16    End of Session Equipment Utilized During Treatment: Gait belt;Rolling walker (2 wheels)  OT Visit Diagnosis: Unsteadiness on feet (R26.81);Other abnormalities of gait and mobility (R26.89);Muscle weakness (generalized) (M62.81)   Activity Tolerance Patient tolerated treatment well   Patient Left with call bell/phone within reach;in chair;with chair alarm set   Nurse Communication Mobility status;Other (comment) (pt with constant itching under breast area)        Time: 1130-1159 OT Time Calculation (min): 29 min  Charges: OT General Charges $OT Visit: 1 Visit OT Treatments $Self Care/Home Management : 23-37 mins  Carver Fila, OTD, OTR/L SecureChat Preferred Acute  Rehab [phone]   Carver Fila Koonce 05/25/2023, 12:24 PM

## 2023-05-25 NOTE — Discharge Instructions (Signed)
Advised to continue current medications as prescribed. Patient being discharged to skilled nursing facility for rehab

## 2023-05-25 NOTE — Care Management Important Message (Signed)
Important Message  Patient Details  Name: CHRISTIANA GUREVICH MRN: [id number] Date of Birth: 10/20/1928   Important Message Given:  Yes - Medicare IM     Dorena Bodo 05/25/2023, 2:39 PM

## 2023-05-27 DIAGNOSIS — I129 Hypertensive chronic kidney disease with stage 1 through stage 4 chronic kidney disease, or unspecified chronic kidney disease: Secondary | ICD-10-CM | POA: Diagnosis not present

## 2023-05-27 DIAGNOSIS — Z9181 History of falling: Secondary | ICD-10-CM | POA: Diagnosis not present

## 2023-05-27 DIAGNOSIS — F02B3 Dementia in other diseases classified elsewhere, moderate, with mood disturbance: Secondary | ICD-10-CM | POA: Diagnosis not present

## 2023-05-27 DIAGNOSIS — E876 Hypokalemia: Secondary | ICD-10-CM | POA: Diagnosis not present

## 2023-05-27 DIAGNOSIS — R2681 Unsteadiness on feet: Secondary | ICD-10-CM | POA: Diagnosis not present

## 2023-05-27 DIAGNOSIS — M4696 Unspecified inflammatory spondylopathy, lumbar region: Secondary | ICD-10-CM | POA: Diagnosis not present

## 2023-05-27 DIAGNOSIS — F0390 Unspecified dementia without behavioral disturbance: Secondary | ICD-10-CM | POA: Diagnosis not present

## 2023-05-27 DIAGNOSIS — E039 Hypothyroidism, unspecified: Secondary | ICD-10-CM | POA: Diagnosis not present

## 2023-05-27 DIAGNOSIS — N183 Chronic kidney disease, stage 3 unspecified: Secondary | ICD-10-CM | POA: Diagnosis not present

## 2023-05-27 DIAGNOSIS — G3183 Dementia with Lewy bodies: Secondary | ICD-10-CM | POA: Diagnosis not present

## 2023-05-27 DIAGNOSIS — R41841 Cognitive communication deficit: Secondary | ICD-10-CM | POA: Diagnosis not present

## 2023-05-27 DIAGNOSIS — Z8669 Personal history of other diseases of the nervous system and sense organs: Secondary | ICD-10-CM | POA: Diagnosis not present

## 2023-05-27 DIAGNOSIS — F32A Depression, unspecified: Secondary | ICD-10-CM | POA: Diagnosis not present

## 2023-05-27 DIAGNOSIS — F39 Unspecified mood [affective] disorder: Secondary | ICD-10-CM | POA: Diagnosis not present

## 2023-05-27 DIAGNOSIS — M6250 Muscle wasting and atrophy, not elsewhere classified, unspecified site: Secondary | ICD-10-CM | POA: Diagnosis not present

## 2023-05-27 DIAGNOSIS — D509 Iron deficiency anemia, unspecified: Secondary | ICD-10-CM | POA: Diagnosis not present

## 2023-05-27 DIAGNOSIS — M6281 Muscle weakness (generalized): Secondary | ICD-10-CM | POA: Diagnosis not present

## 2023-05-31 ENCOUNTER — Telehealth: Payer: Self-pay | Admitting: Adult Health

## 2023-05-31 DIAGNOSIS — F0390 Unspecified dementia without behavioral disturbance: Secondary | ICD-10-CM | POA: Diagnosis not present

## 2023-05-31 DIAGNOSIS — M6250 Muscle wasting and atrophy, not elsewhere classified, unspecified site: Secondary | ICD-10-CM | POA: Diagnosis not present

## 2023-05-31 DIAGNOSIS — R41841 Cognitive communication deficit: Secondary | ICD-10-CM | POA: Diagnosis not present

## 2023-05-31 DIAGNOSIS — R2681 Unsteadiness on feet: Secondary | ICD-10-CM | POA: Diagnosis not present

## 2023-05-31 DIAGNOSIS — Z9181 History of falling: Secondary | ICD-10-CM | POA: Diagnosis not present

## 2023-05-31 DIAGNOSIS — M6281 Muscle weakness (generalized): Secondary | ICD-10-CM | POA: Diagnosis not present

## 2023-05-31 NOTE — Telephone Encounter (Signed)
Son called to inform NP that Pt had a fall 10 days ago at [facility] and was in the hospital, but has already been discharged back to [place].

## 2023-06-03 DIAGNOSIS — Z9181 History of falling: Secondary | ICD-10-CM | POA: Diagnosis not present

## 2023-06-03 DIAGNOSIS — F0390 Unspecified dementia without behavioral disturbance: Secondary | ICD-10-CM | POA: Diagnosis not present

## 2023-06-03 DIAGNOSIS — M6281 Muscle weakness (generalized): Secondary | ICD-10-CM | POA: Diagnosis not present

## 2023-06-03 DIAGNOSIS — M6250 Muscle wasting and atrophy, not elsewhere classified, unspecified site: Secondary | ICD-10-CM | POA: Diagnosis not present

## 2023-06-03 DIAGNOSIS — R2681 Unsteadiness on feet: Secondary | ICD-10-CM | POA: Diagnosis not present

## 2023-06-03 DIAGNOSIS — R41841 Cognitive communication deficit: Secondary | ICD-10-CM | POA: Diagnosis not present

## 2023-06-04 DIAGNOSIS — K644 Residual hemorrhoidal skin tags: Secondary | ICD-10-CM | POA: Diagnosis not present

## 2023-06-04 DIAGNOSIS — N183 Chronic kidney disease, stage 3 unspecified: Secondary | ICD-10-CM | POA: Diagnosis not present

## 2023-06-04 DIAGNOSIS — F0280 Dementia in other diseases classified elsewhere without behavioral disturbance: Secondary | ICD-10-CM | POA: Diagnosis not present

## 2023-06-04 DIAGNOSIS — R600 Localized edema: Secondary | ICD-10-CM | POA: Diagnosis not present

## 2023-06-04 DIAGNOSIS — D631 Anemia in chronic kidney disease: Secondary | ICD-10-CM | POA: Diagnosis not present

## 2023-06-04 DIAGNOSIS — G3183 Dementia with Lewy bodies: Secondary | ICD-10-CM | POA: Diagnosis not present

## 2023-06-04 DIAGNOSIS — I129 Hypertensive chronic kidney disease with stage 1 through stage 4 chronic kidney disease, or unspecified chronic kidney disease: Secondary | ICD-10-CM | POA: Diagnosis not present

## 2023-06-07 DIAGNOSIS — R2681 Unsteadiness on feet: Secondary | ICD-10-CM | POA: Diagnosis not present

## 2023-06-07 DIAGNOSIS — Z9181 History of falling: Secondary | ICD-10-CM | POA: Diagnosis not present

## 2023-06-07 DIAGNOSIS — M6281 Muscle weakness (generalized): Secondary | ICD-10-CM | POA: Diagnosis not present

## 2023-06-07 DIAGNOSIS — F0390 Unspecified dementia without behavioral disturbance: Secondary | ICD-10-CM | POA: Diagnosis not present

## 2023-06-07 DIAGNOSIS — R41841 Cognitive communication deficit: Secondary | ICD-10-CM | POA: Diagnosis not present

## 2023-06-07 DIAGNOSIS — M6250 Muscle wasting and atrophy, not elsewhere classified, unspecified site: Secondary | ICD-10-CM | POA: Diagnosis not present

## 2023-06-09 DIAGNOSIS — R41841 Cognitive communication deficit: Secondary | ICD-10-CM | POA: Diagnosis not present

## 2023-06-09 DIAGNOSIS — M6281 Muscle weakness (generalized): Secondary | ICD-10-CM | POA: Diagnosis not present

## 2023-06-09 DIAGNOSIS — M6250 Muscle wasting and atrophy, not elsewhere classified, unspecified site: Secondary | ICD-10-CM | POA: Diagnosis not present

## 2023-06-09 DIAGNOSIS — R2681 Unsteadiness on feet: Secondary | ICD-10-CM | POA: Diagnosis not present

## 2023-06-09 DIAGNOSIS — Z9181 History of falling: Secondary | ICD-10-CM | POA: Diagnosis not present

## 2023-06-09 DIAGNOSIS — F0390 Unspecified dementia without behavioral disturbance: Secondary | ICD-10-CM | POA: Diagnosis not present

## 2023-06-10 DIAGNOSIS — I739 Peripheral vascular disease, unspecified: Secondary | ICD-10-CM | POA: Diagnosis not present

## 2023-06-10 DIAGNOSIS — L84 Corns and callosities: Secondary | ICD-10-CM | POA: Diagnosis not present

## 2023-06-10 DIAGNOSIS — L603 Nail dystrophy: Secondary | ICD-10-CM | POA: Diagnosis not present

## 2023-06-10 DIAGNOSIS — L602 Onychogryphosis: Secondary | ICD-10-CM | POA: Diagnosis not present

## 2023-06-14 DIAGNOSIS — F5101 Primary insomnia: Secondary | ICD-10-CM | POA: Diagnosis not present

## 2023-06-14 DIAGNOSIS — F03C2 Unspecified dementia, severe, with psychotic disturbance: Secondary | ICD-10-CM | POA: Diagnosis not present

## 2023-06-15 DIAGNOSIS — H353133 Nonexudative age-related macular degeneration, bilateral, advanced atrophic without subfoveal involvement: Secondary | ICD-10-CM | POA: Diagnosis not present

## 2023-06-15 DIAGNOSIS — D3132 Benign neoplasm of left choroid: Secondary | ICD-10-CM | POA: Diagnosis not present

## 2023-06-21 DIAGNOSIS — N183 Chronic kidney disease, stage 3 unspecified: Secondary | ICD-10-CM | POA: Diagnosis not present

## 2023-06-21 DIAGNOSIS — R2681 Unsteadiness on feet: Secondary | ICD-10-CM | POA: Diagnosis not present

## 2023-06-21 DIAGNOSIS — D631 Anemia in chronic kidney disease: Secondary | ICD-10-CM | POA: Diagnosis not present

## 2023-06-21 DIAGNOSIS — F0390 Unspecified dementia without behavioral disturbance: Secondary | ICD-10-CM | POA: Diagnosis not present

## 2023-06-21 DIAGNOSIS — F0283 Dementia in other diseases classified elsewhere, unspecified severity, with mood disturbance: Secondary | ICD-10-CM | POA: Diagnosis not present

## 2023-06-21 DIAGNOSIS — G3183 Dementia with Lewy bodies: Secondary | ICD-10-CM | POA: Diagnosis not present

## 2023-06-21 DIAGNOSIS — M6250 Muscle wasting and atrophy, not elsewhere classified, unspecified site: Secondary | ICD-10-CM | POA: Diagnosis not present

## 2023-06-21 DIAGNOSIS — F32A Depression, unspecified: Secondary | ICD-10-CM | POA: Diagnosis not present

## 2023-06-21 DIAGNOSIS — R41841 Cognitive communication deficit: Secondary | ICD-10-CM | POA: Diagnosis not present

## 2023-06-21 DIAGNOSIS — M6281 Muscle weakness (generalized): Secondary | ICD-10-CM | POA: Diagnosis not present

## 2023-06-21 DIAGNOSIS — E039 Hypothyroidism, unspecified: Secondary | ICD-10-CM | POA: Diagnosis not present

## 2023-06-21 DIAGNOSIS — Z9181 History of falling: Secondary | ICD-10-CM | POA: Diagnosis not present

## 2023-06-21 DIAGNOSIS — I129 Hypertensive chronic kidney disease with stage 1 through stage 4 chronic kidney disease, or unspecified chronic kidney disease: Secondary | ICD-10-CM | POA: Diagnosis not present

## 2023-06-23 DIAGNOSIS — Z9181 History of falling: Secondary | ICD-10-CM | POA: Diagnosis not present

## 2023-06-23 DIAGNOSIS — F0390 Unspecified dementia without behavioral disturbance: Secondary | ICD-10-CM | POA: Diagnosis not present

## 2023-06-23 DIAGNOSIS — M6250 Muscle wasting and atrophy, not elsewhere classified, unspecified site: Secondary | ICD-10-CM | POA: Diagnosis not present

## 2023-06-23 DIAGNOSIS — R41841 Cognitive communication deficit: Secondary | ICD-10-CM | POA: Diagnosis not present

## 2023-06-23 DIAGNOSIS — R2681 Unsteadiness on feet: Secondary | ICD-10-CM | POA: Diagnosis not present

## 2023-06-23 DIAGNOSIS — M6281 Muscle weakness (generalized): Secondary | ICD-10-CM | POA: Diagnosis not present

## 2023-06-24 DIAGNOSIS — R0989 Other specified symptoms and signs involving the circulatory and respiratory systems: Secondary | ICD-10-CM | POA: Diagnosis not present

## 2023-06-24 DIAGNOSIS — Z9181 History of falling: Secondary | ICD-10-CM | POA: Diagnosis not present

## 2023-06-24 DIAGNOSIS — W19XXXA Unspecified fall, initial encounter: Secondary | ICD-10-CM | POA: Diagnosis not present

## 2023-06-24 DIAGNOSIS — M25551 Pain in right hip: Secondary | ICD-10-CM | POA: Diagnosis not present

## 2023-06-24 DIAGNOSIS — M549 Dorsalgia, unspecified: Secondary | ICD-10-CM | POA: Diagnosis not present

## 2023-06-24 DIAGNOSIS — I517 Cardiomegaly: Secondary | ICD-10-CM | POA: Diagnosis not present

## 2023-06-25 DIAGNOSIS — W19XXXA Unspecified fall, initial encounter: Secondary | ICD-10-CM | POA: Diagnosis not present

## 2023-06-25 DIAGNOSIS — R0781 Pleurodynia: Secondary | ICD-10-CM | POA: Diagnosis not present

## 2023-06-25 DIAGNOSIS — G3183 Dementia with Lewy bodies: Secondary | ICD-10-CM | POA: Diagnosis not present

## 2023-06-25 DIAGNOSIS — M1611 Unilateral primary osteoarthritis, right hip: Secondary | ICD-10-CM | POA: Diagnosis not present

## 2023-06-25 DIAGNOSIS — F0280 Dementia in other diseases classified elsewhere without behavioral disturbance: Secondary | ICD-10-CM | POA: Diagnosis not present

## 2023-06-25 DIAGNOSIS — M25551 Pain in right hip: Secondary | ICD-10-CM | POA: Diagnosis not present

## 2023-06-28 DIAGNOSIS — R41841 Cognitive communication deficit: Secondary | ICD-10-CM | POA: Diagnosis not present

## 2023-06-28 DIAGNOSIS — M6250 Muscle wasting and atrophy, not elsewhere classified, unspecified site: Secondary | ICD-10-CM | POA: Diagnosis not present

## 2023-06-28 DIAGNOSIS — R2681 Unsteadiness on feet: Secondary | ICD-10-CM | POA: Diagnosis not present

## 2023-06-28 DIAGNOSIS — Z9181 History of falling: Secondary | ICD-10-CM | POA: Diagnosis not present

## 2023-06-28 DIAGNOSIS — M6281 Muscle weakness (generalized): Secondary | ICD-10-CM | POA: Diagnosis not present

## 2023-06-28 DIAGNOSIS — F0390 Unspecified dementia without behavioral disturbance: Secondary | ICD-10-CM | POA: Diagnosis not present

## 2023-06-30 DIAGNOSIS — F03C2 Unspecified dementia, severe, with psychotic disturbance: Secondary | ICD-10-CM | POA: Diagnosis not present

## 2023-06-30 DIAGNOSIS — Z9181 History of falling: Secondary | ICD-10-CM | POA: Diagnosis not present

## 2023-06-30 DIAGNOSIS — M6250 Muscle wasting and atrophy, not elsewhere classified, unspecified site: Secondary | ICD-10-CM | POA: Diagnosis not present

## 2023-06-30 DIAGNOSIS — F5101 Primary insomnia: Secondary | ICD-10-CM | POA: Diagnosis not present

## 2023-06-30 DIAGNOSIS — M6281 Muscle weakness (generalized): Secondary | ICD-10-CM | POA: Diagnosis not present

## 2023-06-30 DIAGNOSIS — F0390 Unspecified dementia without behavioral disturbance: Secondary | ICD-10-CM | POA: Diagnosis not present

## 2023-06-30 DIAGNOSIS — R2681 Unsteadiness on feet: Secondary | ICD-10-CM | POA: Diagnosis not present

## 2023-06-30 DIAGNOSIS — R41841 Cognitive communication deficit: Secondary | ICD-10-CM | POA: Diagnosis not present

## 2023-07-01 DIAGNOSIS — R42 Dizziness and giddiness: Secondary | ICD-10-CM | POA: Diagnosis not present

## 2023-07-05 DIAGNOSIS — R2681 Unsteadiness on feet: Secondary | ICD-10-CM | POA: Diagnosis not present

## 2023-07-05 DIAGNOSIS — M6250 Muscle wasting and atrophy, not elsewhere classified, unspecified site: Secondary | ICD-10-CM | POA: Diagnosis not present

## 2023-07-05 DIAGNOSIS — M6281 Muscle weakness (generalized): Secondary | ICD-10-CM | POA: Diagnosis not present

## 2023-07-05 DIAGNOSIS — R41841 Cognitive communication deficit: Secondary | ICD-10-CM | POA: Diagnosis not present

## 2023-07-05 DIAGNOSIS — F0390 Unspecified dementia without behavioral disturbance: Secondary | ICD-10-CM | POA: Diagnosis not present

## 2023-07-05 DIAGNOSIS — Z9181 History of falling: Secondary | ICD-10-CM | POA: Diagnosis not present

## 2023-07-07 DIAGNOSIS — R2681 Unsteadiness on feet: Secondary | ICD-10-CM | POA: Diagnosis not present

## 2023-07-07 DIAGNOSIS — M6250 Muscle wasting and atrophy, not elsewhere classified, unspecified site: Secondary | ICD-10-CM | POA: Diagnosis not present

## 2023-07-07 DIAGNOSIS — M6281 Muscle weakness (generalized): Secondary | ICD-10-CM | POA: Diagnosis not present

## 2023-07-07 DIAGNOSIS — Z9181 History of falling: Secondary | ICD-10-CM | POA: Diagnosis not present

## 2023-07-07 DIAGNOSIS — F0390 Unspecified dementia without behavioral disturbance: Secondary | ICD-10-CM | POA: Diagnosis not present

## 2023-07-07 DIAGNOSIS — R41841 Cognitive communication deficit: Secondary | ICD-10-CM | POA: Diagnosis not present

## 2023-07-12 DIAGNOSIS — F5101 Primary insomnia: Secondary | ICD-10-CM | POA: Diagnosis not present

## 2023-07-12 DIAGNOSIS — R2681 Unsteadiness on feet: Secondary | ICD-10-CM | POA: Diagnosis not present

## 2023-07-12 DIAGNOSIS — M6281 Muscle weakness (generalized): Secondary | ICD-10-CM | POA: Diagnosis not present

## 2023-07-12 DIAGNOSIS — R41841 Cognitive communication deficit: Secondary | ICD-10-CM | POA: Diagnosis not present

## 2023-07-12 DIAGNOSIS — M6250 Muscle wasting and atrophy, not elsewhere classified, unspecified site: Secondary | ICD-10-CM | POA: Diagnosis not present

## 2023-07-12 DIAGNOSIS — F0390 Unspecified dementia without behavioral disturbance: Secondary | ICD-10-CM | POA: Diagnosis not present

## 2023-07-12 DIAGNOSIS — F03C2 Unspecified dementia, severe, with psychotic disturbance: Secondary | ICD-10-CM | POA: Diagnosis not present

## 2023-07-12 DIAGNOSIS — Z9181 History of falling: Secondary | ICD-10-CM | POA: Diagnosis not present

## 2023-07-14 DIAGNOSIS — Z9181 History of falling: Secondary | ICD-10-CM | POA: Diagnosis not present

## 2023-07-14 DIAGNOSIS — M6250 Muscle wasting and atrophy, not elsewhere classified, unspecified site: Secondary | ICD-10-CM | POA: Diagnosis not present

## 2023-07-14 DIAGNOSIS — N200 Calculus of kidney: Secondary | ICD-10-CM | POA: Diagnosis not present

## 2023-07-14 DIAGNOSIS — R600 Localized edema: Secondary | ICD-10-CM | POA: Diagnosis not present

## 2023-07-14 DIAGNOSIS — R2681 Unsteadiness on feet: Secondary | ICD-10-CM | POA: Diagnosis not present

## 2023-07-14 DIAGNOSIS — R41841 Cognitive communication deficit: Secondary | ICD-10-CM | POA: Diagnosis not present

## 2023-07-14 DIAGNOSIS — F0390 Unspecified dementia without behavioral disturbance: Secondary | ICD-10-CM | POA: Diagnosis not present

## 2023-07-14 DIAGNOSIS — M6281 Muscle weakness (generalized): Secondary | ICD-10-CM | POA: Diagnosis not present

## 2023-07-14 DIAGNOSIS — I10 Essential (primary) hypertension: Secondary | ICD-10-CM | POA: Diagnosis not present

## 2023-07-14 DIAGNOSIS — E039 Hypothyroidism, unspecified: Secondary | ICD-10-CM | POA: Diagnosis not present

## 2023-07-14 DIAGNOSIS — R634 Abnormal weight loss: Secondary | ICD-10-CM | POA: Diagnosis not present

## 2023-07-15 DIAGNOSIS — F32A Depression, unspecified: Secondary | ICD-10-CM | POA: Diagnosis not present

## 2023-07-15 DIAGNOSIS — F0283 Dementia in other diseases classified elsewhere, unspecified severity, with mood disturbance: Secondary | ICD-10-CM | POA: Diagnosis not present

## 2023-07-15 DIAGNOSIS — M4696 Unspecified inflammatory spondylopathy, lumbar region: Secondary | ICD-10-CM | POA: Diagnosis not present

## 2023-07-15 DIAGNOSIS — R42 Dizziness and giddiness: Secondary | ICD-10-CM | POA: Diagnosis not present

## 2023-07-15 DIAGNOSIS — N183 Chronic kidney disease, stage 3 unspecified: Secondary | ICD-10-CM | POA: Diagnosis not present

## 2023-07-15 DIAGNOSIS — I129 Hypertensive chronic kidney disease with stage 1 through stage 4 chronic kidney disease, or unspecified chronic kidney disease: Secondary | ICD-10-CM | POA: Diagnosis not present

## 2023-07-15 DIAGNOSIS — D631 Anemia in chronic kidney disease: Secondary | ICD-10-CM | POA: Diagnosis not present

## 2023-07-15 DIAGNOSIS — R600 Localized edema: Secondary | ICD-10-CM | POA: Diagnosis not present

## 2023-07-15 DIAGNOSIS — E039 Hypothyroidism, unspecified: Secondary | ICD-10-CM | POA: Diagnosis not present

## 2023-07-15 DIAGNOSIS — G3183 Dementia with Lewy bodies: Secondary | ICD-10-CM | POA: Diagnosis not present

## 2023-07-15 DIAGNOSIS — W19XXXA Unspecified fall, initial encounter: Secondary | ICD-10-CM | POA: Diagnosis not present

## 2023-07-15 DIAGNOSIS — R262 Difficulty in walking, not elsewhere classified: Secondary | ICD-10-CM | POA: Diagnosis not present

## 2023-07-19 DIAGNOSIS — R2681 Unsteadiness on feet: Secondary | ICD-10-CM | POA: Diagnosis not present

## 2023-07-19 DIAGNOSIS — R41841 Cognitive communication deficit: Secondary | ICD-10-CM | POA: Diagnosis not present

## 2023-07-19 DIAGNOSIS — F0390 Unspecified dementia without behavioral disturbance: Secondary | ICD-10-CM | POA: Diagnosis not present

## 2023-07-19 DIAGNOSIS — M6250 Muscle wasting and atrophy, not elsewhere classified, unspecified site: Secondary | ICD-10-CM | POA: Diagnosis not present

## 2023-07-19 DIAGNOSIS — M6281 Muscle weakness (generalized): Secondary | ICD-10-CM | POA: Diagnosis not present

## 2023-07-19 DIAGNOSIS — Z9181 History of falling: Secondary | ICD-10-CM | POA: Diagnosis not present

## 2023-07-21 DIAGNOSIS — Z9181 History of falling: Secondary | ICD-10-CM | POA: Diagnosis not present

## 2023-07-21 DIAGNOSIS — M6250 Muscle wasting and atrophy, not elsewhere classified, unspecified site: Secondary | ICD-10-CM | POA: Diagnosis not present

## 2023-07-21 DIAGNOSIS — R2681 Unsteadiness on feet: Secondary | ICD-10-CM | POA: Diagnosis not present

## 2023-07-21 DIAGNOSIS — M6281 Muscle weakness (generalized): Secondary | ICD-10-CM | POA: Diagnosis not present

## 2023-07-21 DIAGNOSIS — R41841 Cognitive communication deficit: Secondary | ICD-10-CM | POA: Diagnosis not present

## 2023-07-21 DIAGNOSIS — F0390 Unspecified dementia without behavioral disturbance: Secondary | ICD-10-CM | POA: Diagnosis not present

## 2023-07-26 DIAGNOSIS — S61412A Laceration without foreign body of left hand, initial encounter: Secondary | ICD-10-CM | POA: Diagnosis not present

## 2023-07-26 DIAGNOSIS — Z9189 Other specified personal risk factors, not elsewhere classified: Secondary | ICD-10-CM | POA: Diagnosis not present

## 2023-07-26 DIAGNOSIS — R42 Dizziness and giddiness: Secondary | ICD-10-CM | POA: Diagnosis not present

## 2023-07-26 DIAGNOSIS — I10 Essential (primary) hypertension: Secondary | ICD-10-CM | POA: Diagnosis not present

## 2023-07-26 DIAGNOSIS — R600 Localized edema: Secondary | ICD-10-CM | POA: Diagnosis not present

## 2023-07-28 ENCOUNTER — Encounter: Payer: Self-pay | Admitting: Neurology

## 2023-07-28 ENCOUNTER — Ambulatory Visit (INDEPENDENT_AMBULATORY_CARE_PROVIDER_SITE_OTHER): Payer: Medicare Other | Admitting: Neurology

## 2023-07-28 VITALS — BP 204/73 | HR 65 | Ht 63.0 in | Wt 144.4 lb

## 2023-07-28 DIAGNOSIS — G3183 Dementia with Lewy bodies: Secondary | ICD-10-CM | POA: Diagnosis not present

## 2023-07-28 DIAGNOSIS — F02818 Dementia in other diseases classified elsewhere, unspecified severity, with other behavioral disturbance: Secondary | ICD-10-CM | POA: Diagnosis not present

## 2023-07-28 DIAGNOSIS — R42 Dizziness and giddiness: Secondary | ICD-10-CM | POA: Diagnosis not present
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
## 2023-07-28 NOTE — Patient Instructions (Signed)
Good to see you. Continue all your medications. Discuss vertigo/dizziness with your PCP. Follow-up as needed, call for any changes

## 2023-08-17 DIAGNOSIS — E039 Hypothyroidism, unspecified: Secondary | ICD-10-CM | POA: Diagnosis not present

## 2023-08-17 DIAGNOSIS — I129 Hypertensive chronic kidney disease with stage 1 through stage 4 chronic kidney disease, or unspecified chronic kidney disease: Secondary | ICD-10-CM | POA: Diagnosis not present

## 2023-08-17 DIAGNOSIS — R600 Localized edema: Secondary | ICD-10-CM | POA: Diagnosis not present

## 2023-08-17 DIAGNOSIS — E1122 Type 2 diabetes mellitus with diabetic chronic kidney disease: Secondary | ICD-10-CM | POA: Diagnosis not present

## 2023-08-17 DIAGNOSIS — N200 Calculus of kidney: Secondary | ICD-10-CM | POA: Diagnosis not present

## 2023-08-17 DIAGNOSIS — G3183 Dementia with Lewy bodies: Secondary | ICD-10-CM | POA: Diagnosis not present

## 2023-08-17 DIAGNOSIS — F02C3 Dementia in other diseases classified elsewhere, severe, with mood disturbance: Secondary | ICD-10-CM | POA: Diagnosis not present

## 2023-08-17 DIAGNOSIS — F32A Depression, unspecified: Secondary | ICD-10-CM | POA: Diagnosis not present

## 2023-08-17 DIAGNOSIS — D631 Anemia in chronic kidney disease: Secondary | ICD-10-CM | POA: Diagnosis not present

## 2023-08-17 DIAGNOSIS — N1832 Chronic kidney disease, stage 3b: Secondary | ICD-10-CM | POA: Diagnosis not present

## 2023-08-18 ENCOUNTER — Ambulatory Visit: Payer: Medicare Other | Admitting: Podiatry

## 2023-08-19 ENCOUNTER — Encounter: Payer: Self-pay | Admitting: Podiatry

## 2023-08-19 ENCOUNTER — Ambulatory Visit (INDEPENDENT_AMBULATORY_CARE_PROVIDER_SITE_OTHER): Payer: Medicare Other | Admitting: Podiatry

## 2023-08-19 DIAGNOSIS — M79674 Pain in right toe(s): Secondary | ICD-10-CM | POA: Diagnosis not present

## 2023-08-19 DIAGNOSIS — N1832 Chronic kidney disease, stage 3b: Secondary | ICD-10-CM | POA: Diagnosis not present

## 2023-08-19 DIAGNOSIS — M79675 Pain in left toe(s): Secondary | ICD-10-CM

## 2023-08-19 DIAGNOSIS — B351 Tinea unguium: Secondary | ICD-10-CM | POA: Insufficient documentation

## 2023-08-19 NOTE — Progress Notes (Signed)
 This patient returns to my office for at risk foot care.  This patient requires this care by a professional since this patient will be at risk due to having CKD.  This patient is unable to cut nails herself since the patient cannot reach her nails.These nails are painful walking and wearing shoes.  This patient presents with her son  for at risk foot care today.  General Appearance  Alert, conversant and in no acute stress.  Vascular  Dorsalis pedis and posterior tibial  pulses are weakly  palpable  bilaterally.  Capillary return is within normal limits  bilaterally. Temperature is within normal limits  bilaterally. Swelling feet  B/L.  Neurologic  Senn-Weinstein monofilament wire test within normal limits  bilaterally. Muscle power within normal limits bilaterally.  Nails Thick disfigured discolored nails with subungual debris  from hallux to fifth toes bilaterally. No evidence of bacterial infection or drainage bilaterally.  Orthopedic  No limitations of motion  feet .  No crepitus or effusions noted.  No bony pathology or digital deformities noted.  Skin  normotropic skin with no porokeratosis noted bilaterally.  No signs of infections or ulcers noted.     Onychomycosis  Pain in right toes  Pain in left toes  Consent was obtained for treatment procedures.   Mechanical debridement of nails 1-5  bilaterally performed with a nail nipper.  Filed with dremel without incident.    Return office visit  prn                    Told patient to return for periodic foot care and evaluation due to potential at risk complications.   Cordella Bold DPM

## 2023-08-21 ENCOUNTER — Other Ambulatory Visit: Payer: Self-pay

## 2023-08-21 ENCOUNTER — Emergency Department (HOSPITAL_COMMUNITY): Payer: Medicare Other

## 2023-08-21 ENCOUNTER — Encounter (HOSPITAL_COMMUNITY): Payer: Self-pay | Admitting: Emergency Medicine

## 2023-08-21 ENCOUNTER — Emergency Department (HOSPITAL_COMMUNITY)
Admission: EM | Admit: 2023-08-21 | Discharge: 2023-08-21 | Disposition: A | Discharge status: Home / Self Care | Payer: Medicare Other | Attending: Emergency Medicine | Admitting: Emergency Medicine

## 2023-08-21 DIAGNOSIS — Z4789 Encounter for other orthopedic aftercare: Secondary | ICD-10-CM | POA: Diagnosis not present

## 2023-08-21 DIAGNOSIS — W19XXXA Unspecified fall, initial encounter: Secondary | ICD-10-CM | POA: Insufficient documentation

## 2023-08-21 DIAGNOSIS — M25562 Pain in left knee: Secondary | ICD-10-CM | POA: Diagnosis not present

## 2023-08-21 DIAGNOSIS — I10 Essential (primary) hypertension: Secondary | ICD-10-CM | POA: Diagnosis not present

## 2023-08-21 DIAGNOSIS — M25462 Effusion, left knee: Secondary | ICD-10-CM | POA: Diagnosis not present

## 2023-08-21 DIAGNOSIS — S0990XA Unspecified injury of head, initial encounter: Secondary | ICD-10-CM | POA: Diagnosis not present

## 2023-08-21 DIAGNOSIS — M47816 Spondylosis without myelopathy or radiculopathy, lumbar region: Secondary | ICD-10-CM | POA: Diagnosis not present

## 2023-08-21 DIAGNOSIS — M25552 Pain in left hip: Secondary | ICD-10-CM | POA: Insufficient documentation

## 2023-08-21 DIAGNOSIS — R918 Other nonspecific abnormal finding of lung field: Secondary | ICD-10-CM | POA: Diagnosis not present

## 2023-08-21 DIAGNOSIS — R404 Transient alteration of awareness: Secondary | ICD-10-CM | POA: Diagnosis not present

## 2023-08-21 DIAGNOSIS — J9811 Atelectasis: Secondary | ICD-10-CM | POA: Diagnosis not present

## 2023-08-21 DIAGNOSIS — Z7401 Bed confinement status: Secondary | ICD-10-CM | POA: Diagnosis not present

## 2023-08-21 DIAGNOSIS — R4182 Altered mental status, unspecified: Secondary | ICD-10-CM | POA: Diagnosis not present

## 2023-08-21 DIAGNOSIS — M79605 Pain in left leg: Secondary | ICD-10-CM | POA: Insufficient documentation

## 2023-08-21 DIAGNOSIS — I517 Cardiomegaly: Secondary | ICD-10-CM | POA: Diagnosis not present

## 2023-08-21 DIAGNOSIS — G319 Degenerative disease of nervous system, unspecified: Secondary | ICD-10-CM | POA: Diagnosis not present

## 2023-08-21 DIAGNOSIS — I6782 Cerebral ischemia: Secondary | ICD-10-CM | POA: Diagnosis not present

## 2023-08-21 DIAGNOSIS — S199XXA Unspecified injury of neck, initial encounter: Secondary | ICD-10-CM | POA: Diagnosis not present

## 2023-08-21 DIAGNOSIS — S0083XA Contusion of other part of head, initial encounter: Secondary | ICD-10-CM | POA: Diagnosis not present

## 2023-08-21 DIAGNOSIS — Z043 Encounter for examination and observation following other accident: Secondary | ICD-10-CM | POA: Diagnosis not present

## 2023-08-21 NOTE — ED Provider Notes (Signed)
 [facility] Provider Note   CSN: [id number] Arrival date & time: 08/21/23  9781     History  Chief Complaint  Patient presents with   Fall    Unwitnessed ground level fall with hematoma noted to left front temporal region. No blood thinners. Pt denies LOC, but is currently staying on memory care unit at [facility].     [name] is a 89 y.o. female.  Patient presents to the emergency department for evaluation after an unwitnessed fall.  Patient is in a nursing home, staff heard her fall.  They immediately went to her room and found her sitting up against the wall with a hematoma to her forehead.  At arrival, she appears comfortable, has no complaints.       Home Medications Prior to Admission medications   Medication Sig Start Date End Date Taking? Authorizing Provider  amLODipine  (NORVASC ) 10 MG tablet TAKE 1 TABLET BY MOUTH DAILY Patient taking differently: Take 5 mg by mouth daily. 06/23/22   Nafziger, Darleene, NP  Brexpiprazole  (REXULTI ) 0.5 MG TABS TAKE 1 TABLET BY MOUTH AT NIGHT 05/11/23   Georjean Darice HERO, MD  docusate sodium  (COLACE) 100 MG capsule Take 1 capsule (100 mg total) by mouth 2 (two) times daily. Patient taking differently: Take 100 mg by mouth 2 (two) times daily as needed for mild constipation. 12/11/22   Hongalgi, Anand D, MD  donepezil  (ARICEPT ) 10 MG tablet Take 1 tablet (10 mg total) by mouth at bedtime. 12/11/22   Hongalgi, Anand D, MD  ferrous sulfate  324 MG TBEC Take 324 mg by mouth daily.    [provider]  hydrALAZINE  (APRESOLINE ) 25 MG tablet Take 1 tablet (25 mg total) by mouth every 8 (eight) hours. 05/25/23 06/24/23  Leotis Bogus, MD  hydrocortisone (ANUSOL-HC) 25 MG suppository Place 25 mg rectally 2 (two) times daily. 04/27/23   [provider]  levothyroxine  (SYNTHROID ) 150 MCG tablet Take 1 tablet (150 mcg total) by mouth daily before breakfast. 12/11/22   Hongalgi, Anand D, MD   memantine  (NAMENDA ) 10 MG tablet TAKE 1 TABLET BY MOUTH IN THE  MORNING AND AT BEDTIME Patient taking differently: Take 10 mg by mouth at bedtime. 12/07/22   Georjean Darice HERO, MD  Multiple Vitamins-Minerals (PRESERVISION AREDS 2 PO) Take 1 capsule by mouth daily with breakfast. Patient not taking: Reported on 07/28/2023    [provider]  olmesartan  (BENICAR ) 20 MG tablet Take 0.5 tablets (10 mg total) by mouth daily. 12/11/22   Hongalgi, Anand D, MD  omeprazole  (PRILOSEC) 40 MG capsule TAKE 1 CAPSULE BY MOUTH DAILY 06/23/22   Nafziger, Darleene, NP  ondansetron  (ZOFRAN -ODT) 4 MG disintegrating tablet Take 4 mg by mouth every 8 (eight) hours as needed for nausea. 01/06/23   [provider]  pantoprazole  (PROTONIX ) 40 MG tablet Take 40 mg by mouth 2 (two) times daily. 05/16/23   [provider]  polyethylene glycol (MIRALAX  / GLYCOLAX ) 17 g packet Take 17 g by mouth daily as needed for mild constipation. 12/11/22   Hongalgi, Anand D, MD  polyvinyl alcohol  (ARTIFICIAL TEARS) 1.4 % ophthalmic solution Place 1 drop into both eyes 3 (three) times daily as needed for dry eyes.    [provider]  pregabalin  (LYRICA ) 25 MG capsule Take 25 mg by mouth daily.    [provider]  QUEtiapine  (SEROQUEL ) 50 MG tablet TAKE 1 TABLET BY MOUTH TWICE  DAILY 02/01/23   Georjean Darice  M, MD  sertraline  (ZOLOFT ) 100 MG tablet Take 1 tablet (100 mg total) by mouth in the morning and at bedtime. 12/11/22   Hongalgi, Anand D, MD  TYLENOL  500 MG tablet Take 1,000 mg by mouth in the morning and at bedtime.    [provider]      Allergies    Lactose intolerance (gi) and Oxycodone hcl    Review of Systems   Review of Systems  Physical Exam Updated Vital Signs BP (!) 184/56   Pulse 61   Temp 98.6 F (37 C) (Oral)   Resp 19   Ht 5' 3 (1.6 m)   Wt 65.5 kg   SpO2 96%   BMI 25.58 kg/m  Physical Exam Vitals and nursing note reviewed.  Constitutional:      General: She  is not in acute distress.    Appearance: She is well-developed.  HENT:     Head: Normocephalic. Contusion present.      Mouth/Throat:     Mouth: Mucous membranes are moist.  Eyes:     General: Vision grossly intact. Gaze aligned appropriately.     Extraocular Movements: Extraocular movements intact.     Conjunctiva/sclera: Conjunctivae normal.  Cardiovascular:     Rate and Rhythm: Normal rate and regular rhythm.     Pulses: Normal pulses.     Heart sounds: Normal heart sounds, S1 normal and S2 normal. No murmur heard.    No friction rub. No gallop.  Pulmonary:     Effort: Pulmonary effort is normal. No respiratory distress.     Breath sounds: Normal breath sounds.  Abdominal:     General: Bowel sounds are normal.     Palpations: Abdomen is soft.     Tenderness: There is no abdominal tenderness. There is no guarding or rebound.     Hernia: No hernia is present.  Musculoskeletal:        General: No swelling.     Cervical back: Full passive range of motion without pain, normal range of motion and neck supple. No spinous process tenderness or muscular tenderness. Normal range of motion.     Left hip: Tenderness present.     Left knee: Tenderness present.     Right lower leg: No edema.     Left lower leg: No edema.  Skin:    General: Skin is warm and dry.     Capillary Refill: Capillary refill takes less than 2 seconds.     Findings: No ecchymosis, erythema, rash or wound.  Neurological:     General: No focal deficit present.     Mental Status: She is alert and oriented to person, place, and time.     GCS: GCS eye subscore is 4. GCS verbal subscore is 5. GCS motor subscore is 6.     Cranial Nerves: Cranial nerves 2-12 are intact.     Sensory: Sensation is intact.     Motor: Motor function is intact.     Coordination: Coordination is intact.  Psychiatric:        Attention and Perception: Attention normal.        Mood and Affect: Mood normal.        Speech: Speech normal.         Behavior: Behavior normal.     ED Results / Procedures / Treatments   Labs (all labs ordered are listed, but only abnormal results are displayed) Labs Reviewed - No data to display  EKG None  Radiology CT HEAD  WO CONTRAST ( ) Result Date: 08/21/2023 CLINICAL DATA:  Fall, head trauma EXAM: CT HEAD WITHOUT CONTRAST CT CERVICAL SPINE WITHOUT CONTRAST TECHNIQUE: Multidetector CT imaging of the head and cervical spine was performed following the standard protocol without intravenous contrast. Multiplanar CT image reconstructions of the cervical spine were also generated. RADIATION DOSE REDUCTION: This exam was performed according to the departmental dose-optimization program which includes automated exposure control, adjustment of the mA and/or kV according to patient size and/or use of iterative reconstruction technique. COMPARISON:  CTA head/neck dated 05/21/2023 FINDINGS: CT HEAD FINDINGS Brain: No evidence of acute infarction, hemorrhage, hydrocephalus, extra-axial collection or mass lesion/mass effect. Age related atrophy. Subcortical white matter and periventricular small vessel ischemic changes. Vascular: Intracranial atherosclerosis. Skull: Normal. Negative for fracture or focal lesion. Sinuses/Orbits: The visualized paranasal sinuses are essentially clear. The mastoid air cells are unopacified. Other: None. CT CERVICAL SPINE FINDINGS Alignment: Normal cervical lordosis. Skull base and vertebrae: No acute fracture. No primary bone lesion or focal pathologic process. Soft tissues and spinal canal: No prevertebral fluid or swelling. No visible canal hematoma. Disc levels: Status post ACDF with osseous fusion at C4-5. Status post ACDF at C6-7. Mild degenerative changes at C5-6. Spinal canal is patent. Upper chest: Visualized lung apices are notable for mild pleural-parenchymal scarring. Other: Visualized thyroid  is unremarkable. IMPRESSION: No acute intracranial abnormality. Atrophy with small  vessel ischemic changes. No traumatic injury to the cervical spine. Status post ACDF at C4-5 and C6-7. Electronically Signed   By: Pinkie Pebbles M.D.   On: 08/21/2023 03:33   CT CERVICAL SPINE WO CONTRAST Result Date: 08/21/2023 CLINICAL DATA:  Fall, head trauma EXAM: CT HEAD WITHOUT CONTRAST CT CERVICAL SPINE WITHOUT CONTRAST TECHNIQUE: Multidetector CT imaging of the head and cervical spine was performed following the standard protocol without intravenous contrast. Multiplanar CT image reconstructions of the cervical spine were also generated. RADIATION DOSE REDUCTION: This exam was performed according to the departmental dose-optimization program which includes automated exposure control, adjustment of the mA and/or kV according to patient size and/or use of iterative reconstruction technique. COMPARISON:  CTA head/neck dated 05/21/2023 FINDINGS: CT HEAD FINDINGS Brain: No evidence of acute infarction, hemorrhage, hydrocephalus, extra-axial collection or mass lesion/mass effect. Age related atrophy. Subcortical white matter and periventricular small vessel ischemic changes. Vascular: Intracranial atherosclerosis. Skull: Normal. Negative for fracture or focal lesion. Sinuses/Orbits: The visualized paranasal sinuses are essentially clear. The mastoid air cells are unopacified. Other: None. CT CERVICAL SPINE FINDINGS Alignment: Normal cervical lordosis. Skull base and vertebrae: No acute fracture. No primary bone lesion or focal pathologic process. Soft tissues and spinal canal: No prevertebral fluid or swelling. No visible canal hematoma. Disc levels: Status post ACDF with osseous fusion at C4-5. Status post ACDF at C6-7. Mild degenerative changes at C5-6. Spinal canal is patent. Upper chest: Visualized lung apices are notable for mild pleural-parenchymal scarring. Other: Visualized thyroid  is unremarkable. IMPRESSION: No acute intracranial abnormality. Atrophy with small vessel ischemic changes. No  traumatic injury to the cervical spine. Status post ACDF at C4-5 and C6-7. Electronically Signed   By: Pinkie Pebbles M.D.   On: 08/21/2023 03:33   DG Chest Port 1 View Result Date: 08/21/2023 CLINICAL DATA:  Fall EXAM: PORTABLE CHEST 1 VIEW COMPARISON:  05/20/2023 FINDINGS: Mild bilateral mid/lower lung opacities, favoring aspiration over atelectasis. No pleural effusion or pneumothorax. Stable mild cardiomegaly. Cervical spine fixation hardware, incompletely visualized. IMPRESSION: Mild bilateral mid/lower lung opacities, favoring aspiration over atelectasis. Electronically Signed   By: Sriyesh  Krishnan  M.D.   On: 08/21/2023 03:30   DG Knee Complete 4 Views Left Result Date: 08/21/2023 CLINICAL DATA:  Fall EXAM: LEFT KNEE - COMPLETE 4+ VIEW COMPARISON:  None Available. FINDINGS: Compression plate and screw fixation of the proximal tibia. No evidence of hardware complication. Suspected old lateral tibial plateau deformity. No acute fracture dislocation. Trace suprapatellar knee joint effusion. IMPRESSION: Compression plate and screw fixation of the proximal tibia. No evidence of hardware complication. Suspected old lateral tibial plateau deformity. No acute fracture or dislocation. Electronically Signed   By: Pinkie Pebbles M.D.   On: 08/21/2023 03:28   DG Hip Unilat W or Wo Pelvis 2-3 Views Left Result Date: 08/21/2023 CLINICAL DATA:  Fall EXAM: DG HIP (WITH OR WITHOUT PELVIS) 2-3V LEFT COMPARISON:  None Available. FINDINGS: No fracture or dislocation is seen. Bilateral hip joint spaces are preserved. Visualized bony pelvis appears intact. Degenerative changes of the lumbar spine. IMPRESSION: Negative. Electronically Signed   By: Pinkie Pebbles M.D.   On: 08/21/2023 03:26    Procedures Procedures    Medications Ordered in ED Medications - No data to display  ED Course/ Medical Decision Making/ A&P                                 Medical Decision Making Amount and/or Complexity of  Data Reviewed Radiology: ordered.   Presents for evaluation after an unwitnessed ground-level fall.  Patient was able to get up and ambulate to the stretcher for EMS.  She has a small contusion on her forehead, did not have any other complaints.  She did complain of some pain in the left leg at arrival.  X-ray of left hip, left knee are negative.  This pain seems to have resolved, she is now moving both lower extremities without pain or difficulty.        Final Clinical Impression(s) / ED Diagnoses Final diagnoses:  Contusion of face, initial encounter  Fall, initial encounter    Rx / DC Orders ED Discharge Orders     None         Magali Bray, Lonni PARAS, MD 08/21/23 [phone]

## 2023-08-23 DIAGNOSIS — S0083XA Contusion of other part of head, initial encounter: Secondary | ICD-10-CM | POA: Diagnosis not present

## 2023-08-23 DIAGNOSIS — S0181XA Laceration without foreign body of other part of head, initial encounter: Secondary | ICD-10-CM | POA: Diagnosis not present

## 2023-08-23 DIAGNOSIS — F0390 Unspecified dementia without behavioral disturbance: Secondary | ICD-10-CM | POA: Diagnosis not present

## 2023-08-23 DIAGNOSIS — R41841 Cognitive communication deficit: Secondary | ICD-10-CM | POA: Diagnosis not present

## 2023-08-23 DIAGNOSIS — R1311 Dysphagia, oral phase: Secondary | ICD-10-CM | POA: Diagnosis not present

## 2023-08-23 DIAGNOSIS — G9340 Encephalopathy, unspecified: Secondary | ICD-10-CM | POA: Diagnosis not present

## 2023-08-24 DIAGNOSIS — R1311 Dysphagia, oral phase: Secondary | ICD-10-CM | POA: Diagnosis not present

## 2023-08-24 DIAGNOSIS — F0390 Unspecified dementia without behavioral disturbance: Secondary | ICD-10-CM | POA: Diagnosis not present

## 2023-08-24 DIAGNOSIS — E119 Type 2 diabetes mellitus without complications: Secondary | ICD-10-CM | POA: Diagnosis not present

## 2023-08-24 DIAGNOSIS — R41841 Cognitive communication deficit: Secondary | ICD-10-CM | POA: Diagnosis not present

## 2023-08-24 DIAGNOSIS — G9340 Encephalopathy, unspecified: Secondary | ICD-10-CM | POA: Diagnosis not present

## 2023-08-25 DIAGNOSIS — F32A Depression, unspecified: Secondary | ICD-10-CM | POA: Diagnosis not present

## 2023-08-25 DIAGNOSIS — N1832 Chronic kidney disease, stage 3b: Secondary | ICD-10-CM | POA: Diagnosis not present

## 2023-08-25 DIAGNOSIS — R1311 Dysphagia, oral phase: Secondary | ICD-10-CM | POA: Diagnosis not present

## 2023-08-25 DIAGNOSIS — R41841 Cognitive communication deficit: Secondary | ICD-10-CM | POA: Diagnosis not present

## 2023-08-25 DIAGNOSIS — F0390 Unspecified dementia without behavioral disturbance: Secondary | ICD-10-CM | POA: Diagnosis not present

## 2023-08-25 DIAGNOSIS — T50905A Adverse effect of unspecified drugs, medicaments and biological substances, initial encounter: Secondary | ICD-10-CM | POA: Diagnosis not present

## 2023-08-25 DIAGNOSIS — D631 Anemia in chronic kidney disease: Secondary | ICD-10-CM | POA: Diagnosis not present

## 2023-08-25 DIAGNOSIS — E876 Hypokalemia: Secondary | ICD-10-CM | POA: Diagnosis not present

## 2023-08-25 DIAGNOSIS — G9340 Encephalopathy, unspecified: Secondary | ICD-10-CM | POA: Diagnosis not present

## 2023-08-26 DIAGNOSIS — G9340 Encephalopathy, unspecified: Secondary | ICD-10-CM | POA: Diagnosis not present

## 2023-08-26 DIAGNOSIS — R41841 Cognitive communication deficit: Secondary | ICD-10-CM | POA: Diagnosis not present

## 2023-08-26 DIAGNOSIS — F0390 Unspecified dementia without behavioral disturbance: Secondary | ICD-10-CM | POA: Diagnosis not present

## 2023-08-26 DIAGNOSIS — R1311 Dysphagia, oral phase: Secondary | ICD-10-CM | POA: Diagnosis not present

## 2023-08-27 DIAGNOSIS — F0390 Unspecified dementia without behavioral disturbance: Secondary | ICD-10-CM | POA: Diagnosis not present

## 2023-08-27 DIAGNOSIS — R41841 Cognitive communication deficit: Secondary | ICD-10-CM | POA: Diagnosis not present

## 2023-08-27 DIAGNOSIS — R1311 Dysphagia, oral phase: Secondary | ICD-10-CM | POA: Diagnosis not present

## 2023-08-27 DIAGNOSIS — G9340 Encephalopathy, unspecified: Secondary | ICD-10-CM | POA: Diagnosis not present

## 2023-08-28 DIAGNOSIS — I10 Essential (primary) hypertension: Secondary | ICD-10-CM | POA: Diagnosis not present

## 2023-08-31 DIAGNOSIS — G9340 Encephalopathy, unspecified: Secondary | ICD-10-CM | POA: Diagnosis not present

## 2023-08-31 DIAGNOSIS — F0390 Unspecified dementia without behavioral disturbance: Secondary | ICD-10-CM | POA: Diagnosis not present

## 2023-08-31 DIAGNOSIS — R1311 Dysphagia, oral phase: Secondary | ICD-10-CM | POA: Diagnosis not present

## 2023-08-31 DIAGNOSIS — R41841 Cognitive communication deficit: Secondary | ICD-10-CM | POA: Diagnosis not present

## 2023-09-01 DIAGNOSIS — R41841 Cognitive communication deficit: Secondary | ICD-10-CM | POA: Diagnosis not present

## 2023-09-01 DIAGNOSIS — F0390 Unspecified dementia without behavioral disturbance: Secondary | ICD-10-CM | POA: Diagnosis not present

## 2023-09-01 DIAGNOSIS — G9340 Encephalopathy, unspecified: Secondary | ICD-10-CM | POA: Diagnosis not present

## 2023-09-01 DIAGNOSIS — R1311 Dysphagia, oral phase: Secondary | ICD-10-CM | POA: Diagnosis not present

## 2023-09-02 DIAGNOSIS — G9340 Encephalopathy, unspecified: Secondary | ICD-10-CM | POA: Diagnosis not present

## 2023-09-02 DIAGNOSIS — R41841 Cognitive communication deficit: Secondary | ICD-10-CM | POA: Diagnosis not present

## 2023-09-02 DIAGNOSIS — R1311 Dysphagia, oral phase: Secondary | ICD-10-CM | POA: Diagnosis not present

## 2023-09-02 DIAGNOSIS — F0390 Unspecified dementia without behavioral disturbance: Secondary | ICD-10-CM | POA: Diagnosis not present

## 2023-09-03 DIAGNOSIS — F0390 Unspecified dementia without behavioral disturbance: Secondary | ICD-10-CM | POA: Diagnosis not present

## 2023-09-03 DIAGNOSIS — R1311 Dysphagia, oral phase: Secondary | ICD-10-CM | POA: Diagnosis not present

## 2023-09-03 DIAGNOSIS — R41841 Cognitive communication deficit: Secondary | ICD-10-CM | POA: Diagnosis not present

## 2023-09-03 DIAGNOSIS — G9340 Encephalopathy, unspecified: Secondary | ICD-10-CM | POA: Diagnosis not present

## 2023-09-06 DIAGNOSIS — G9340 Encephalopathy, unspecified: Secondary | ICD-10-CM | POA: Diagnosis not present

## 2023-09-06 DIAGNOSIS — R41841 Cognitive communication deficit: Secondary | ICD-10-CM | POA: Diagnosis not present

## 2023-09-06 DIAGNOSIS — F0390 Unspecified dementia without behavioral disturbance: Secondary | ICD-10-CM | POA: Diagnosis not present

## 2023-09-06 DIAGNOSIS — R1311 Dysphagia, oral phase: Secondary | ICD-10-CM | POA: Diagnosis not present

## 2023-09-08 DIAGNOSIS — R41841 Cognitive communication deficit: Secondary | ICD-10-CM | POA: Diagnosis not present

## 2023-09-08 DIAGNOSIS — R1311 Dysphagia, oral phase: Secondary | ICD-10-CM | POA: Diagnosis not present

## 2023-09-08 DIAGNOSIS — G9340 Encephalopathy, unspecified: Secondary | ICD-10-CM | POA: Diagnosis not present

## 2023-09-08 DIAGNOSIS — F0390 Unspecified dementia without behavioral disturbance: Secondary | ICD-10-CM | POA: Diagnosis not present

## 2023-09-09 DIAGNOSIS — R634 Abnormal weight loss: Secondary | ICD-10-CM | POA: Diagnosis not present

## 2023-09-09 DIAGNOSIS — F0390 Unspecified dementia without behavioral disturbance: Secondary | ICD-10-CM | POA: Diagnosis not present

## 2023-09-09 DIAGNOSIS — I10 Essential (primary) hypertension: Secondary | ICD-10-CM | POA: Diagnosis not present

## 2023-09-09 DIAGNOSIS — R41841 Cognitive communication deficit: Secondary | ICD-10-CM | POA: Diagnosis not present

## 2023-09-09 DIAGNOSIS — R531 Weakness: Secondary | ICD-10-CM | POA: Diagnosis not present

## 2023-09-09 DIAGNOSIS — G9340 Encephalopathy, unspecified: Secondary | ICD-10-CM | POA: Diagnosis not present

## 2023-09-09 DIAGNOSIS — R1311 Dysphagia, oral phase: Secondary | ICD-10-CM | POA: Diagnosis not present

## 2023-09-09 DIAGNOSIS — R058 Other specified cough: Secondary | ICD-10-CM | POA: Diagnosis not present

## 2023-09-09 DIAGNOSIS — Z20828 Contact with and (suspected) exposure to other viral communicable diseases: Secondary | ICD-10-CM | POA: Diagnosis not present

## 2023-09-13 DIAGNOSIS — E876 Hypokalemia: Secondary | ICD-10-CM | POA: Diagnosis not present

## 2023-09-13 DIAGNOSIS — R1311 Dysphagia, oral phase: Secondary | ICD-10-CM | POA: Diagnosis not present

## 2023-09-13 DIAGNOSIS — Z20828 Contact with and (suspected) exposure to other viral communicable diseases: Secondary | ICD-10-CM | POA: Diagnosis not present

## 2023-09-13 DIAGNOSIS — R059 Cough, unspecified: Secondary | ICD-10-CM | POA: Diagnosis not present

## 2023-09-13 DIAGNOSIS — F5101 Primary insomnia: Secondary | ICD-10-CM | POA: Diagnosis not present

## 2023-09-13 DIAGNOSIS — R41841 Cognitive communication deficit: Secondary | ICD-10-CM | POA: Diagnosis not present

## 2023-09-13 DIAGNOSIS — F03C2 Unspecified dementia, severe, with psychotic disturbance: Secondary | ICD-10-CM | POA: Diagnosis not present

## 2023-09-13 DIAGNOSIS — G9340 Encephalopathy, unspecified: Secondary | ICD-10-CM | POA: Diagnosis not present

## 2023-09-13 DIAGNOSIS — E039 Hypothyroidism, unspecified: Secondary | ICD-10-CM | POA: Diagnosis not present

## 2023-09-13 DIAGNOSIS — R0989 Other specified symptoms and signs involving the circulatory and respiratory systems: Secondary | ICD-10-CM | POA: Diagnosis not present

## 2023-09-13 DIAGNOSIS — F0390 Unspecified dementia without behavioral disturbance: Secondary | ICD-10-CM | POA: Diagnosis not present

## 2023-09-13 DIAGNOSIS — R058 Other specified cough: Secondary | ICD-10-CM | POA: Diagnosis not present

## 2023-09-13 DIAGNOSIS — N1832 Chronic kidney disease, stage 3b: Secondary | ICD-10-CM | POA: Diagnosis not present

## 2023-09-13 DIAGNOSIS — I129 Hypertensive chronic kidney disease with stage 1 through stage 4 chronic kidney disease, or unspecified chronic kidney disease: Secondary | ICD-10-CM | POA: Diagnosis not present

## 2023-09-14 DIAGNOSIS — E039 Hypothyroidism, unspecified: Secondary | ICD-10-CM | POA: Diagnosis not present

## 2023-09-14 DIAGNOSIS — R058 Other specified cough: Secondary | ICD-10-CM | POA: Diagnosis not present

## 2023-09-14 DIAGNOSIS — Z20828 Contact with and (suspected) exposure to other viral communicable diseases: Secondary | ICD-10-CM | POA: Diagnosis not present

## 2023-09-14 DIAGNOSIS — F03B0 Unspecified dementia, moderate, without behavioral disturbance, psychotic disturbance, mood disturbance, and anxiety: Secondary | ICD-10-CM | POA: Diagnosis not present

## 2023-09-28 DIAGNOSIS — R059 Cough, unspecified: Secondary | ICD-10-CM | POA: Diagnosis not present

## 2023-09-28 DIAGNOSIS — R509 Fever, unspecified: Secondary | ICD-10-CM | POA: Diagnosis not present

## 2023-09-28 DIAGNOSIS — I10 Essential (primary) hypertension: Secondary | ICD-10-CM | POA: Diagnosis not present

## 2023-09-28 DIAGNOSIS — R058 Other specified cough: Secondary | ICD-10-CM | POA: Diagnosis not present

## 2023-09-28 DIAGNOSIS — R0989 Other specified symptoms and signs involving the circulatory and respiratory systems: Secondary | ICD-10-CM | POA: Diagnosis not present

## 2023-09-29 DIAGNOSIS — J101 Influenza due to other identified influenza virus with other respiratory manifestations: Secondary | ICD-10-CM | POA: Diagnosis not present

## 2023-10-18 DIAGNOSIS — I129 Hypertensive chronic kidney disease with stage 1 through stage 4 chronic kidney disease, or unspecified chronic kidney disease: Secondary | ICD-10-CM | POA: Diagnosis not present

## 2023-10-18 DIAGNOSIS — N1832 Chronic kidney disease, stage 3b: Secondary | ICD-10-CM | POA: Diagnosis not present

## 2023-10-18 DIAGNOSIS — Z9189 Other specified personal risk factors, not elsewhere classified: Secondary | ICD-10-CM | POA: Diagnosis not present

## 2023-10-18 DIAGNOSIS — F03B3 Unspecified dementia, moderate, with mood disturbance: Secondary | ICD-10-CM | POA: Diagnosis not present

## 2023-10-18 DIAGNOSIS — Z6823 Body mass index (BMI) 23.0-23.9, adult: Secondary | ICD-10-CM | POA: Diagnosis not present

## 2023-10-18 DIAGNOSIS — R634 Abnormal weight loss: Secondary | ICD-10-CM | POA: Diagnosis not present

## 2023-10-18 DIAGNOSIS — Z0000 Encounter for general adult medical examination without abnormal findings: Secondary | ICD-10-CM | POA: Diagnosis not present

## 2023-10-18 DIAGNOSIS — Z7189 Other specified counseling: Secondary | ICD-10-CM | POA: Diagnosis not present

## 2023-10-19 ENCOUNTER — Other Ambulatory Visit: Payer: Self-pay | Admitting: Neurology

## 2023-10-25 DIAGNOSIS — F03C2 Unspecified dementia, severe, with psychotic disturbance: Secondary | ICD-10-CM | POA: Diagnosis not present

## 2023-10-25 DIAGNOSIS — F5101 Primary insomnia: Secondary | ICD-10-CM | POA: Diagnosis not present

## 2023-10-25 DIAGNOSIS — F321 Major depressive disorder, single episode, moderate: Secondary | ICD-10-CM | POA: Diagnosis not present

## 2023-10-26 DIAGNOSIS — L603 Nail dystrophy: Secondary | ICD-10-CM | POA: Diagnosis not present

## 2023-10-26 DIAGNOSIS — L602 Onychogryphosis: Secondary | ICD-10-CM | POA: Diagnosis not present

## 2023-10-26 DIAGNOSIS — I739 Peripheral vascular disease, unspecified: Secondary | ICD-10-CM | POA: Diagnosis not present

## 2023-11-10 DIAGNOSIS — E039 Hypothyroidism, unspecified: Secondary | ICD-10-CM | POA: Diagnosis not present

## 2023-11-18 DIAGNOSIS — M6281 Muscle weakness (generalized): Secondary | ICD-10-CM | POA: Diagnosis not present

## 2023-11-18 DIAGNOSIS — R2681 Unsteadiness on feet: Secondary | ICD-10-CM | POA: Diagnosis not present

## 2023-11-18 DIAGNOSIS — G9340 Encephalopathy, unspecified: Secondary | ICD-10-CM | POA: Diagnosis not present

## 2023-11-20 DIAGNOSIS — M6281 Muscle weakness (generalized): Secondary | ICD-10-CM | POA: Diagnosis not present

## 2023-11-20 DIAGNOSIS — G9340 Encephalopathy, unspecified: Secondary | ICD-10-CM | POA: Diagnosis not present

## 2023-11-20 DIAGNOSIS — R2681 Unsteadiness on feet: Secondary | ICD-10-CM | POA: Diagnosis not present

## 2023-11-21 DIAGNOSIS — G9340 Encephalopathy, unspecified: Secondary | ICD-10-CM | POA: Diagnosis not present

## 2023-11-21 DIAGNOSIS — M6281 Muscle weakness (generalized): Secondary | ICD-10-CM | POA: Diagnosis not present

## 2023-11-21 DIAGNOSIS — R2681 Unsteadiness on feet: Secondary | ICD-10-CM | POA: Diagnosis not present

## 2023-11-22 DIAGNOSIS — R2681 Unsteadiness on feet: Secondary | ICD-10-CM | POA: Diagnosis not present

## 2023-11-22 DIAGNOSIS — M6281 Muscle weakness (generalized): Secondary | ICD-10-CM | POA: Diagnosis not present

## 2023-11-22 DIAGNOSIS — G9340 Encephalopathy, unspecified: Secondary | ICD-10-CM | POA: Diagnosis not present

## 2023-11-23 DIAGNOSIS — G9340 Encephalopathy, unspecified: Secondary | ICD-10-CM | POA: Diagnosis not present

## 2023-11-23 DIAGNOSIS — M6281 Muscle weakness (generalized): Secondary | ICD-10-CM | POA: Diagnosis not present

## 2023-11-23 DIAGNOSIS — R2681 Unsteadiness on feet: Secondary | ICD-10-CM | POA: Diagnosis not present

## 2023-11-25 DIAGNOSIS — R2681 Unsteadiness on feet: Secondary | ICD-10-CM | POA: Diagnosis not present

## 2023-11-25 DIAGNOSIS — M6281 Muscle weakness (generalized): Secondary | ICD-10-CM | POA: Diagnosis not present

## 2023-11-25 DIAGNOSIS — G9340 Encephalopathy, unspecified: Secondary | ICD-10-CM | POA: Diagnosis not present

## 2023-11-29 DIAGNOSIS — M6281 Muscle weakness (generalized): Secondary | ICD-10-CM | POA: Diagnosis not present

## 2023-11-29 DIAGNOSIS — R2681 Unsteadiness on feet: Secondary | ICD-10-CM | POA: Diagnosis not present

## 2023-11-29 DIAGNOSIS — G9340 Encephalopathy, unspecified: Secondary | ICD-10-CM | POA: Diagnosis not present

## 2023-11-30 DIAGNOSIS — G9340 Encephalopathy, unspecified: Secondary | ICD-10-CM | POA: Diagnosis not present

## 2023-11-30 DIAGNOSIS — R2681 Unsteadiness on feet: Secondary | ICD-10-CM | POA: Diagnosis not present

## 2023-11-30 DIAGNOSIS — M6281 Muscle weakness (generalized): Secondary | ICD-10-CM | POA: Diagnosis not present

## 2023-12-01 DIAGNOSIS — R2681 Unsteadiness on feet: Secondary | ICD-10-CM | POA: Diagnosis not present

## 2023-12-01 DIAGNOSIS — M6281 Muscle weakness (generalized): Secondary | ICD-10-CM | POA: Diagnosis not present

## 2023-12-01 DIAGNOSIS — G9340 Encephalopathy, unspecified: Secondary | ICD-10-CM | POA: Diagnosis not present

## 2023-12-02 DIAGNOSIS — G9340 Encephalopathy, unspecified: Secondary | ICD-10-CM | POA: Diagnosis not present

## 2023-12-02 DIAGNOSIS — M6281 Muscle weakness (generalized): Secondary | ICD-10-CM | POA: Diagnosis not present

## 2023-12-02 DIAGNOSIS — R2681 Unsteadiness on feet: Secondary | ICD-10-CM | POA: Diagnosis not present

## 2023-12-06 DIAGNOSIS — R2681 Unsteadiness on feet: Secondary | ICD-10-CM | POA: Diagnosis not present

## 2023-12-06 DIAGNOSIS — M6281 Muscle weakness (generalized): Secondary | ICD-10-CM | POA: Diagnosis not present

## 2023-12-06 DIAGNOSIS — G9340 Encephalopathy, unspecified: Secondary | ICD-10-CM | POA: Diagnosis not present

## 2023-12-07 DIAGNOSIS — M6281 Muscle weakness (generalized): Secondary | ICD-10-CM | POA: Diagnosis not present

## 2023-12-07 DIAGNOSIS — G9340 Encephalopathy, unspecified: Secondary | ICD-10-CM | POA: Diagnosis not present

## 2023-12-07 DIAGNOSIS — R2681 Unsteadiness on feet: Secondary | ICD-10-CM | POA: Diagnosis not present

## 2023-12-09 DIAGNOSIS — R2681 Unsteadiness on feet: Secondary | ICD-10-CM | POA: Diagnosis not present

## 2023-12-09 DIAGNOSIS — G9340 Encephalopathy, unspecified: Secondary | ICD-10-CM | POA: Diagnosis not present

## 2023-12-09 DIAGNOSIS — M6281 Muscle weakness (generalized): Secondary | ICD-10-CM | POA: Diagnosis not present

## 2023-12-14 DIAGNOSIS — M6281 Muscle weakness (generalized): Secondary | ICD-10-CM | POA: Diagnosis not present

## 2023-12-14 DIAGNOSIS — E039 Hypothyroidism, unspecified: Secondary | ICD-10-CM | POA: Diagnosis not present

## 2023-12-14 DIAGNOSIS — R2681 Unsteadiness on feet: Secondary | ICD-10-CM | POA: Diagnosis not present

## 2023-12-14 DIAGNOSIS — G3183 Dementia with Lewy bodies: Secondary | ICD-10-CM | POA: Diagnosis not present

## 2023-12-14 DIAGNOSIS — F02B0 Dementia in other diseases classified elsewhere, moderate, without behavioral disturbance, psychotic disturbance, mood disturbance, and anxiety: Secondary | ICD-10-CM | POA: Diagnosis not present

## 2023-12-14 DIAGNOSIS — F32A Depression, unspecified: Secondary | ICD-10-CM | POA: Diagnosis not present

## 2023-12-14 DIAGNOSIS — G9340 Encephalopathy, unspecified: Secondary | ICD-10-CM | POA: Diagnosis not present

## 2023-12-20 DIAGNOSIS — M6281 Muscle weakness (generalized): Secondary | ICD-10-CM | POA: Diagnosis not present

## 2023-12-20 DIAGNOSIS — G9340 Encephalopathy, unspecified: Secondary | ICD-10-CM | POA: Diagnosis not present

## 2023-12-20 DIAGNOSIS — R2681 Unsteadiness on feet: Secondary | ICD-10-CM | POA: Diagnosis not present

## 2023-12-20 DIAGNOSIS — F5101 Primary insomnia: Secondary | ICD-10-CM | POA: Diagnosis not present

## 2023-12-20 DIAGNOSIS — F03C2 Unspecified dementia, severe, with psychotic disturbance: Secondary | ICD-10-CM | POA: Diagnosis not present

## 2023-12-21 DIAGNOSIS — M6281 Muscle weakness (generalized): Secondary | ICD-10-CM | POA: Diagnosis not present

## 2023-12-21 DIAGNOSIS — G9340 Encephalopathy, unspecified: Secondary | ICD-10-CM | POA: Diagnosis not present

## 2023-12-21 DIAGNOSIS — R2681 Unsteadiness on feet: Secondary | ICD-10-CM | POA: Diagnosis not present

## 2023-12-22 DIAGNOSIS — G9340 Encephalopathy, unspecified: Secondary | ICD-10-CM | POA: Diagnosis not present

## 2023-12-22 DIAGNOSIS — R2681 Unsteadiness on feet: Secondary | ICD-10-CM | POA: Diagnosis not present

## 2023-12-22 DIAGNOSIS — M6281 Muscle weakness (generalized): Secondary | ICD-10-CM | POA: Diagnosis not present

## 2023-12-23 DIAGNOSIS — R2681 Unsteadiness on feet: Secondary | ICD-10-CM | POA: Diagnosis not present

## 2023-12-23 DIAGNOSIS — G9340 Encephalopathy, unspecified: Secondary | ICD-10-CM | POA: Diagnosis not present

## 2023-12-23 DIAGNOSIS — M6281 Muscle weakness (generalized): Secondary | ICD-10-CM | POA: Diagnosis not present

## 2023-12-24 DIAGNOSIS — R2681 Unsteadiness on feet: Secondary | ICD-10-CM | POA: Diagnosis not present

## 2023-12-24 DIAGNOSIS — M6281 Muscle weakness (generalized): Secondary | ICD-10-CM | POA: Diagnosis not present

## 2023-12-24 DIAGNOSIS — G9340 Encephalopathy, unspecified: Secondary | ICD-10-CM | POA: Diagnosis not present

## 2023-12-27 DIAGNOSIS — G9340 Encephalopathy, unspecified: Secondary | ICD-10-CM | POA: Diagnosis not present

## 2023-12-27 DIAGNOSIS — R2681 Unsteadiness on feet: Secondary | ICD-10-CM | POA: Diagnosis not present

## 2023-12-27 DIAGNOSIS — M6281 Muscle weakness (generalized): Secondary | ICD-10-CM | POA: Diagnosis not present

## 2023-12-30 DIAGNOSIS — E876 Hypokalemia: Secondary | ICD-10-CM | POA: Diagnosis not present

## 2023-12-30 DIAGNOSIS — G3183 Dementia with Lewy bodies: Secondary | ICD-10-CM | POA: Diagnosis not present

## 2023-12-30 DIAGNOSIS — I10 Essential (primary) hypertension: Secondary | ICD-10-CM | POA: Diagnosis not present

## 2023-12-30 DIAGNOSIS — D631 Anemia in chronic kidney disease: Secondary | ICD-10-CM | POA: Diagnosis not present

## 2023-12-30 DIAGNOSIS — I129 Hypertensive chronic kidney disease with stage 1 through stage 4 chronic kidney disease, or unspecified chronic kidney disease: Secondary | ICD-10-CM | POA: Diagnosis not present

## 2023-12-30 DIAGNOSIS — N1832 Chronic kidney disease, stage 3b: Secondary | ICD-10-CM | POA: Diagnosis not present

## 2023-12-30 DIAGNOSIS — F32A Depression, unspecified: Secondary | ICD-10-CM | POA: Diagnosis not present

## 2023-12-30 DIAGNOSIS — Z9181 History of falling: Secondary | ICD-10-CM | POA: Diagnosis not present

## 2023-12-30 DIAGNOSIS — F0283 Dementia in other diseases classified elsewhere, unspecified severity, with mood disturbance: Secondary | ICD-10-CM | POA: Diagnosis not present

## 2024-01-03 DIAGNOSIS — R2681 Unsteadiness on feet: Secondary | ICD-10-CM | POA: Diagnosis not present

## 2024-01-03 DIAGNOSIS — G9340 Encephalopathy, unspecified: Secondary | ICD-10-CM | POA: Diagnosis not present

## 2024-01-03 DIAGNOSIS — M6281 Muscle weakness (generalized): Secondary | ICD-10-CM | POA: Diagnosis not present

## 2024-01-05 DIAGNOSIS — M6281 Muscle weakness (generalized): Secondary | ICD-10-CM | POA: Diagnosis not present

## 2024-01-05 DIAGNOSIS — G9340 Encephalopathy, unspecified: Secondary | ICD-10-CM | POA: Diagnosis not present

## 2024-01-05 DIAGNOSIS — R2681 Unsteadiness on feet: Secondary | ICD-10-CM | POA: Diagnosis not present

## 2024-01-10 DIAGNOSIS — N189 Chronic kidney disease, unspecified: Secondary | ICD-10-CM | POA: Diagnosis not present

## 2024-01-10 DIAGNOSIS — D649 Anemia, unspecified: Secondary | ICD-10-CM | POA: Diagnosis not present

## 2024-01-10 DIAGNOSIS — E559 Vitamin D deficiency, unspecified: Secondary | ICD-10-CM | POA: Diagnosis not present

## 2024-01-13 ENCOUNTER — Emergency Department (HOSPITAL_COMMUNITY)

## 2024-01-13 ENCOUNTER — Encounter (HOSPITAL_COMMUNITY): Payer: Self-pay

## 2024-01-13 ENCOUNTER — Emergency Department (HOSPITAL_COMMUNITY)
Admission: EM | Admit: 2024-01-13 | Discharge: 2024-01-13 | Disposition: A | Discharge status: Skilled Nursing Facility | Attending: Emergency Medicine | Admitting: Emergency Medicine

## 2024-01-13 ENCOUNTER — Other Ambulatory Visit: Payer: Self-pay

## 2024-01-13 DIAGNOSIS — F0390 Unspecified dementia without behavioral disturbance: Secondary | ICD-10-CM | POA: Diagnosis not present

## 2024-01-13 DIAGNOSIS — S066X0A Traumatic subarachnoid hemorrhage without loss of consciousness, initial encounter: Secondary | ICD-10-CM | POA: Diagnosis not present

## 2024-01-13 DIAGNOSIS — W19XXXA Unspecified fall, initial encounter: Secondary | ICD-10-CM | POA: Diagnosis not present

## 2024-01-13 DIAGNOSIS — R102 Pelvic and perineal pain: Secondary | ICD-10-CM | POA: Diagnosis not present

## 2024-01-13 DIAGNOSIS — Z79899 Other long term (current) drug therapy: Secondary | ICD-10-CM | POA: Diagnosis not present

## 2024-01-13 DIAGNOSIS — R634 Abnormal weight loss: Secondary | ICD-10-CM | POA: Diagnosis not present

## 2024-01-13 DIAGNOSIS — S06360A Traumatic hemorrhage of cerebrum, unspecified, without loss of consciousness, initial encounter: Secondary | ICD-10-CM | POA: Insufficient documentation

## 2024-01-13 DIAGNOSIS — E876 Hypokalemia: Secondary | ICD-10-CM | POA: Diagnosis not present

## 2024-01-13 DIAGNOSIS — R404 Transient alteration of awareness: Secondary | ICD-10-CM | POA: Diagnosis not present

## 2024-01-13 DIAGNOSIS — I700 Atherosclerosis of aorta: Secondary | ICD-10-CM | POA: Diagnosis not present

## 2024-01-13 DIAGNOSIS — R296 Repeated falls: Secondary | ICD-10-CM | POA: Diagnosis not present

## 2024-01-13 DIAGNOSIS — S0101XA Laceration without foreign body of scalp, initial encounter: Secondary | ICD-10-CM | POA: Diagnosis not present

## 2024-01-13 DIAGNOSIS — I10 Essential (primary) hypertension: Secondary | ICD-10-CM | POA: Diagnosis not present

## 2024-01-13 DIAGNOSIS — R58 Hemorrhage, not elsewhere classified: Secondary | ICD-10-CM | POA: Diagnosis not present

## 2024-01-13 DIAGNOSIS — I629 Nontraumatic intracranial hemorrhage, unspecified: Secondary | ICD-10-CM

## 2024-01-13 DIAGNOSIS — Z7401 Bed confinement status: Secondary | ICD-10-CM | POA: Diagnosis not present

## 2024-01-13 DIAGNOSIS — Z043 Encounter for examination and observation following other accident: Secondary | ICD-10-CM | POA: Diagnosis not present

## 2024-01-13 DIAGNOSIS — S0990XA Unspecified injury of head, initial encounter: Secondary | ICD-10-CM | POA: Diagnosis not present

## 2024-01-13 DIAGNOSIS — G3183 Dementia with Lewy bodies: Secondary | ICD-10-CM | POA: Diagnosis not present

## 2024-01-13 DIAGNOSIS — M1611 Unilateral primary osteoarthritis, right hip: Secondary | ICD-10-CM | POA: Diagnosis not present

## 2024-01-13 DIAGNOSIS — Z23 Encounter for immunization: Secondary | ICD-10-CM | POA: Diagnosis not present

## 2024-01-13 DIAGNOSIS — M5020 Other cervical disc displacement, unspecified cervical region: Secondary | ICD-10-CM | POA: Diagnosis not present

## 2024-01-13 DIAGNOSIS — I959 Hypotension, unspecified: Secondary | ICD-10-CM | POA: Diagnosis not present

## 2024-01-13 LAB — BASIC METABOLIC PANEL WITH GFR
Anion gap: 9 (ref 5–15)
BUN: 26 mg/dL — ABNORMAL HIGH (ref 8–23)
CO2: 25 mmol/L (ref 22–32)
Calcium: 8.7 mg/dL — ABNORMAL LOW (ref 8.9–10.3)
Chloride: 107 mmol/L (ref 98–111)
Creatinine, Ser: 1.62 mg/dL — ABNORMAL HIGH (ref 0.44–1.00)
GFR, Estimated: 30 mL/min — ABNORMAL LOW (ref >60)
Glucose, Bld: 93 mg/dL (ref 70–99)
Potassium: 3 mmol/L — ABNORMAL LOW (ref 3.5–5.1)
Sodium: 141 mmol/L (ref 135–145)

## 2024-01-13 LAB — I-STAT CHEM 8, ED
BUN: 24 mg/dL — ABNORMAL HIGH (ref 8–23)
Calcium, Ion: 1.08 mmol/L — ABNORMAL LOW (ref 1.15–1.40)
Chloride: 106 mmol/L (ref 98–111)
Creatinine, Ser: 1.8 mg/dL — ABNORMAL HIGH (ref 0.44–1.00)
Glucose, Bld: 91 mg/dL (ref 70–99)
HCT: 29 % — ABNORMAL LOW (ref 36.0–46.0)
Hemoglobin: 9.9 g/dL — ABNORMAL LOW (ref 12.0–15.0)
Potassium: 2.6 mmol/L — CL (ref 3.5–5.1)
Sodium: 141 mmol/L (ref 135–145)
TCO2: 23 mmol/L (ref 22–32)

## 2024-01-13 LAB — CBC WITH DIFFERENTIAL/PLATELET
Abs Immature Granulocytes: 0.03 10*3/uL (ref 0.00–0.07)
Basophils Absolute: 0 10*3/uL (ref 0.0–0.1)
Basophils Relative: 1 %
Eosinophils Absolute: 0.1 10*3/uL (ref 0.0–0.5)
Eosinophils Relative: 1 %
HCT: 30.5 % — ABNORMAL LOW (ref 36.0–46.0)
Hemoglobin: 9.7 g/dL — ABNORMAL LOW (ref 12.0–15.0)
Immature Granulocytes: 1 %
Lymphocytes Relative: 21 %
Lymphs Abs: 1.4 10*3/uL (ref 0.7–4.0)
MCH: 32.6 pg (ref 26.0–34.0)
MCHC: 31.8 g/dL (ref 30.0–36.0)
MCV: 102.3 fL — ABNORMAL HIGH (ref 80.0–100.0)
Monocytes Absolute: 0.7 10*3/uL (ref 0.1–1.0)
Monocytes Relative: 10 %
Neutro Abs: 4.4 10*3/uL (ref 1.7–7.7)
Neutrophils Relative %: 66 %
Platelets: 200 10*3/uL (ref 150–400)
RBC: 2.98 MIL/uL — ABNORMAL LOW (ref 3.87–5.11)
RDW: 13.5 % (ref 11.5–15.5)
WBC: 6.6 10*3/uL (ref 4.0–10.5)
nRBC: 0 % (ref 0.0–0.2)

## 2024-01-13 MED ORDER — POTASSIUM CHLORIDE CRYS ER 20 MEQ PO TBCR: 40.0000 meq | EXTENDED_RELEASE_TABLET | Freq: Two times a day (BID) | ORAL | 0 refills | Status: DC

## 2024-01-13 MED ORDER — POTASSIUM CHLORIDE 10 MEQ/100ML IV SOLN
10.0000 meq | Freq: Once | INTRAVENOUS | Status: AC
  Administered 2024-01-13: 10 meq via INTRAVENOUS
  Filled 2024-01-13: qty 100

## 2024-01-13 MED ORDER — TETANUS-DIPHTH-ACELL PERTUSSIS 5-2.5-18.5 LF-MCG/0.5 IM SUSY
0.5000 mL | PREFILLED_SYRINGE | Freq: Once | INTRAMUSCULAR | Status: AC
  Administered 2024-01-13: 0.5 mL via INTRAMUSCULAR
  Filled 2024-01-13: qty 0.5

## 2024-01-13 MED ORDER — POTASSIUM CHLORIDE 20 MEQ PO PACK
60.0000 meq | PACK | Freq: Every day | ORAL | Status: DC
  Administered 2024-01-13: 60 meq via ORAL
  Filled 2024-01-13: qty 3

## 2024-01-13 NOTE — ED Provider Notes (Signed)
 [place] EMERGENCY DEPARTMENT AT [facility] Provider Note   CSN: [id number] Arrival date & time: 01/13/24  1302     History  Chief Complaint  Patient presents with   [name] is a 89 y.o. female.  Level 5 caveat secondary to dementia.  Sounds like fall at the facility this morning.  Hematoma to the back of her head.  She not on blood thinners.  She has been at her baseline but bleeding from the back of her head.  She denies any pain elsewhere.  She supposed be walking with a walker but sometimes forgets.  She has been in her normal state of health otherwise.  The history is provided by the patient and a caregiver.       Home Medications Prior to Admission medications   Medication Sig Start Date End Date Taking? Authorizing Provider  amLODipine  (NORVASC ) 10 MG tablet TAKE 1 TABLET BY MOUTH DAILY Patient taking differently: Take 5 mg by mouth daily. 06/23/22  Yes Nafziger, Randel Buss, NP  Brexpiprazole  (REXULTI ) 0.5 MG TABS TAKE 1 TABLET BY MOUTH AT NIGHT Patient taking differently: Take 0.5 mg by mouth at bedtime. 05/11/23  Yes Jhonny Moss, MD  furosemide  (LASIX ) 20 MG tablet Take 20 mg by mouth 2 (two) times daily.   Yes [provider]  hydrALAZINE  (APRESOLINE ) 50 MG tablet Take 50 mg by mouth See admin instructions. Take 50 mg by mouth four times a day and hold for a Systolic reading less than 120   Yes [provider]  isosorbide mononitrate (ISMO) 10 MG tablet Take 10 mg by mouth 2 (two) times daily.   Yes [provider]  levothyroxine  (SYNTHROID ) 175 MCG tablet Take 175 mcg by mouth daily before breakfast.   Yes [provider]  Multiple Vitamin (MULTIVITAMIN) tablet Take 1 tablet by mouth See admin instructions. Take 1 tablet by mouth once a day with food   Yes [provider]  Multiple Vitamins-Minerals (PRESERVISION AREDS) TABS Take 1 tablet by mouth daily.   Yes [provider]  olmesartan   (BENICAR ) 40 MG tablet Take 40 mg by mouth See admin instructions. Take 40 mg by mouth once a day and hold for a Systolic reading less than 110   Yes [provider]  pantoprazole  (PROTONIX ) 40 MG tablet Take 40 mg by mouth 2 (two) times daily. 05/16/23  Yes [provider]  QUEtiapine  (SEROQUEL ) 50 MG tablet TAKE 1 TABLET BY MOUTH TWICE  DAILY Patient taking differently: Take 50-75 mg by mouth See admin instructions. Take 50 mg by mouth once a day and 75 mg at bedtime 02/01/23  Yes Jhonny Moss, MD  sertraline  (ZOLOFT ) 100 MG tablet Take 1 tablet (100 mg total) by mouth in the morning and at bedtime. 12/11/22  Yes Hongalgi, Anand D, MD  TYLENOL  500 MG tablet Take 1,000 mg by mouth in the morning and at bedtime.   Yes [provider]  docusate sodium  (COLACE) 100 MG capsule Take 1 capsule (100 mg total) by mouth 2 (two) times daily. Patient not taking: Reported on 01/13/2024 12/11/22   Hongalgi, Anand D, MD  donepezil  (ARICEPT ) 10 MG tablet Take 1 tablet (10 mg total) by mouth at bedtime. Patient not taking: Reported on 01/13/2024 12/11/22   Hongalgi, Anand D, MD  hydrALAZINE  (APRESOLINE ) 25 MG tablet Take 1 tablet (25 mg total) by mouth every 8 (eight) hours. Patient not taking: Reported on 01/13/2024 05/25/23 01/13/24  Magdalene School, MD  memantine  (NAMENDA ) 10 MG tablet TAKE 1 TABLET BY MOUTH IN THE  MORNING AND AT BEDTIME Patient taking differently: Take 10 mg by mouth 2 (two) times daily. 12/07/22  Yes Jhonny Moss, MD  omeprazole  (PRILOSEC) 40 MG capsule TAKE 1 CAPSULE BY MOUTH DAILY 06/23/22   Nafziger, Randel Buss, NP  ondansetron  (ZOFRAN -ODT) 4 MG disintegrating tablet Take 4 mg by mouth every 8 (eight) hours as needed for nausea. 01/06/23   [provider]  polyethylene glycol (MIRALAX  / GLYCOLAX ) 17 g packet Take 17 g by mouth daily as needed for mild constipation. Patient taking differently: Take 17 g by mouth daily as needed for mild constipation (mix into 6 ounces of  fluid). 12/11/22  Yes Hongalgi, Anand D, MD  polyvinyl alcohol  (ARTIFICIAL TEARS) 1.4 % ophthalmic solution Place 1 drop into both eyes 3 (three) times daily as needed for dry eyes.    [provider]  pregabalin  (LYRICA ) 25 MG capsule Take 25 mg by mouth daily.    [provider]      Allergies    Lactose intolerance (gi) and Oxycodone hcl    Review of Systems   Review of Systems  Physical Exam Updated Vital Signs BP (!) 148/56 (BP Location: Left Arm)   Pulse 68   Temp 97.7 F (36.5 C) (Oral)   Resp 15   Ht 5\' 3"  (1.6 m)   Wt 65.5 kg   SpO2 97%   BMI 25.58 kg/m  Physical Exam Vitals and nursing note reviewed.  Constitutional:      General: She is not in acute distress.    Appearance: She is well-developed. She is not ill-appearing.  HENT:     Head:     Comments: Hematoma to the back of the head after this was washed out looks like there is mostly hematoma and abrasion but no laceration she is having a little bit of oozing from abrasion to the back of her    Nose: Nose normal.     Mouth/Throat:     Mouth: Mucous membranes are moist.  Eyes:     Extraocular Movements: Extraocular movements intact.     Conjunctiva/sclera: Conjunctivae normal.     Pupils: Pupils are equal, round, and reactive to light.  Cardiovascular:     Rate and Rhythm: Normal rate and regular rhythm.     Pulses: Normal pulses.     Heart sounds: Normal heart sounds. No murmur heard. Pulmonary:     Effort: Pulmonary effort is normal. No respiratory distress.     Breath sounds: Normal breath sounds.  Abdominal:     General: Abdomen is flat.     Palpations: Abdomen is soft.     Tenderness: There is no abdominal tenderness.  Musculoskeletal:        General: No swelling or tenderness.     Cervical back: Normal range of motion and neck supple.     Comments: No extremity tenderness, no midline spinal tenderness  Skin:    General: Skin is warm and dry.     Capillary Refill: Capillary  refill takes less than 2 seconds.  Neurological:     General: No focal deficit present.     Mental Status: She is alert.     Comments: Answers questions appropriately moves all extremities  Psychiatric:        Mood and Affect: Mood normal.     ED Results / Procedures / Treatments   Labs (all labs ordered are listed,  but only abnormal results are displayed) Labs Reviewed  CBC WITH DIFFERENTIAL/PLATELET - Abnormal; Notable for the following components:      Result Value   RBC 2.98 (*)    Hemoglobin 9.7 (*)    HCT 30.5 (*)    MCV 102.3 (*)    All other components within normal limits  I-STAT CHEM 8, ED - Abnormal; Notable for the following components:   Potassium 2.6 (*)    BUN 24 (*)    Creatinine, Ser 1.80 (*)    Calcium , Ion 1.08 (*)    Hemoglobin 9.9 (*)    HCT 29.0 (*)    All other components within normal limits  BASIC METABOLIC PANEL WITH GFR    EKG None  Radiology CT HEAD WO CONTRAST ( ) Result Date: 01/13/2024 CLINICAL DATA:  Provided history: Polytrauma, blunt. Additional history provided: Fall. Posterior head laceration. History of dementia. EXAM: CT HEAD WITHOUT CONTRAST CT CERVICAL SPINE WITHOUT CONTRAST TECHNIQUE: Multidetector CT imaging of the head and cervical spine was performed following the standard protocol without intravenous contrast. Multiplanar CT image reconstructions of the cervical spine were also generated. RADIATION DOSE REDUCTION: This exam was performed according to the departmental dose-optimization program which includes automated exposure control, adjustment of the mA and/or kV according to patient size and/or use of iterative reconstruction technique. COMPARISON:  Head CT 08/21/2023.  Cervical spine CT 08/21/2023. FINDINGS: CT HEAD FINDINGS Brain: Generalized cerebral atrophy. Small-volume acute subarachnoid hemorrhage along the right frontoparietal convexity. Patchy and ill-defined hypoattenuation within the cerebral white matter, nonspecific  but compatible with mild chronic small vessel ischemic disease. No demarcated cortical infarct. No evidence of an intracranial mass. No midline shift or hydrocephalus. Vascular: No hyperdense vessel.  Atherosclerotic calcifications. Skull: No calvarial fracture or aggressive osseous lesion. Sinuses/Orbits: No mass or acute finding within the imaged orbits. Minimal mucosal thickening within the bilateral ethmoid and right maxillary sinuses. Trace secretions within the right sphenoid sinus. Other: Right temporoparietal scalp hematoma and laceration. CT CERVICAL SPINE FINDINGS Alignment: Levocurvature of the cervical spine. 2 mm C5-C6 grade 1 anterolisthesis. Skull base and vertebrae: The basion-dental and atlanto-dental intervals are maintained.No evidence of acute fracture to the cervical spine. Prior C4-C5 and C6-C7 ACDF. Solid bridging osseous fusion across these disc spaces. No evidence of hardware fracture. Soft tissues and spinal canal: No prevertebral fluid or swelling. No visible canal hematoma. Disc levels: Postoperative changes to the cervical spine as described above. Superimposed cervical spondylosis with multilevel disc bulges/central disc protrusions, endplate spurring, uncovertebral hypertrophy and facet arthropathy. No appreciable high-grade spinal canal stenosis. Multilevel bony neural foraminal narrowing. Degenerative changes also present at the C1-C2 articulation. Upper chest: No consolidation within the imaged lung apices. No visible pneumothorax. Biapical pleuroparenchymal scarring. CT head impression #1 called by telephone at the time of interpretation on 01/13/2024 at 3:29 pm to provider Kourtlynn Trevor , who verbally acknowledged these results. IMPRESSION: CT head: 1. Small-volume acute subarachnoid hemorrhage along the right frontoparietal convexity. 2. Right temporoparietal scalp hematoma and laceration. 3. Cerebral atrophy and chronic small vessel ischemic disease. CT cervical spine: 1. No  evidence of an acute cervical spine fracture. 2. 2 mm C5-C6 grade 1 anterolisthesis, unchanged from the prior cervical spine CT of 08/21/2023. 3. Levocurvature of the cervical spine. 4. Cervical spondylosis and postoperative changes as described. Electronically Signed   By: Bascom Lily D.O.   On: 01/13/2024 15:31   CT Cervical Spine Wo Contrast Result Date: 01/13/2024 CLINICAL DATA:  Provided history: Polytrauma, blunt. Additional history  provided: Fall. Posterior head laceration. History of dementia. EXAM: CT HEAD WITHOUT CONTRAST CT CERVICAL SPINE WITHOUT CONTRAST TECHNIQUE: Multidetector CT imaging of the head and cervical spine was performed following the standard protocol without intravenous contrast. Multiplanar CT image reconstructions of the cervical spine were also generated. RADIATION DOSE REDUCTION: This exam was performed according to the departmental dose-optimization program which includes automated exposure control, adjustment of the mA and/or kV according to patient size and/or use of iterative reconstruction technique. COMPARISON:  Head CT 08/21/2023.  Cervical spine CT 08/21/2023. FINDINGS: CT HEAD FINDINGS Brain: Generalized cerebral atrophy. Small-volume acute subarachnoid hemorrhage along the right frontoparietal convexity. Patchy and ill-defined hypoattenuation within the cerebral white matter, nonspecific but compatible with mild chronic small vessel ischemic disease. No demarcated cortical infarct. No evidence of an intracranial mass. No midline shift or hydrocephalus. Vascular: No hyperdense vessel.  Atherosclerotic calcifications. Skull: No calvarial fracture or aggressive osseous lesion. Sinuses/Orbits: No mass or acute finding within the imaged orbits. Minimal mucosal thickening within the bilateral ethmoid and right maxillary sinuses. Trace secretions within the right sphenoid sinus. Other: Right temporoparietal scalp hematoma and laceration. CT CERVICAL SPINE FINDINGS Alignment:  Levocurvature of the cervical spine. 2 mm C5-C6 grade 1 anterolisthesis. Skull base and vertebrae: The basion-dental and atlanto-dental intervals are maintained.No evidence of acute fracture to the cervical spine. Prior C4-C5 and C6-C7 ACDF. Solid bridging osseous fusion across these disc spaces. No evidence of hardware fracture. Soft tissues and spinal canal: No prevertebral fluid or swelling. No visible canal hematoma. Disc levels: Postoperative changes to the cervical spine as described above. Superimposed cervical spondylosis with multilevel disc bulges/central disc protrusions, endplate spurring, uncovertebral hypertrophy and facet arthropathy. No appreciable high-grade spinal canal stenosis. Multilevel bony neural foraminal narrowing. Degenerative changes also present at the C1-C2 articulation. Upper chest: No consolidation within the imaged lung apices. No visible pneumothorax. Biapical pleuroparenchymal scarring. CT head impression #1 called by telephone at the time of interpretation on 01/13/2024 at 3:29 pm to provider Londin Antone , who verbally acknowledged these results. IMPRESSION: CT head: 1. Small-volume acute subarachnoid hemorrhage along the right frontoparietal convexity. 2. Right temporoparietal scalp hematoma and laceration. 3. Cerebral atrophy and chronic small vessel ischemic disease. CT cervical spine: 1. No evidence of an acute cervical spine fracture. 2. 2 mm C5-C6 grade 1 anterolisthesis, unchanged from the prior cervical spine CT of 08/21/2023. 3. Levocurvature of the cervical spine. 4. Cervical spondylosis and postoperative changes as described. Electronically Signed   By: Bascom Lily D.O.   On: 01/13/2024 15:31   DG Pelvis 1-2 Views Result Date: 01/13/2024 CLINICAL DATA:  Pain after fall. EXAM: PELVIS - 1-2 VIEW COMPARISON:  May 20, 2023. FINDINGS: There is no evidence of pelvic fracture or diastasis. No pelvic bone lesions are seen. IMPRESSION: Negative. Electronically Signed    By: Rosalene Colon M.D.   On: 01/13/2024 14:36   DG Chest 2 View Result Date: 01/13/2024 CLINICAL DATA:  Fall. EXAM: CHEST - 2 VIEW COMPARISON:  August 21, 2023. FINDINGS: Stable cardiomediastinal silhouette. Both lungs are clear. The visualized skeletal structures are unremarkable. IMPRESSION: No active cardiopulmonary disease. Aortic Atherosclerosis (ICD10-I70.0). Electronically Signed   By: Rosalene Colon M.D.   On: 01/13/2024 14:35    Procedures Procedures    Medications Ordered in ED Medications  potassium chloride  (KLOR-CON ) packet 60 mEq (60 mEq Oral Given 01/13/24 1629)  potassium chloride  10 mEq in 100 mL IVPB (10 mEq Intravenous New Bag/Given 01/13/24 1629)  Tdap (BOOSTRIX ) injection 0.5  mL (0.5 mLs Intramuscular Given 01/13/24 1405)    ED Course/ Medical Decision Making/ A&P                                 Medical Decision Making Amount and/or Complexity of Data Reviewed Labs: ordered. Radiology: ordered.  Risk Prescription drug management.   [name] is here with head injury.  Normal vitals.  No fever.  History of dementia.  Sounds like she had a fall at her facility.  Hit the back of her head.  She has a hematoma to the back of the head but there is no laceration.  She is got a little bit of oozing from the abrasion and hematoma to the back of the head but nothing that I can actually repair with staples or sutures.  Will place a pressure dressing on this.  Will get a CT scan of the head and neck.  Is not having any pain elsewhere.  She is able to move all of her extremities without any tenderness.  But will get a chest x-ray and pelvic x-ray to further evaluate.  I talked with the family who is here.  She is not on any blood thinners.  She has been in her normal state of health otherwise.  She seems to be at her baseline.  Will update tetanus shot.  Radiology called me on the phone to state that patient does have small volume acute subarachnoid hemorrhage along the right  frontal parietal convexity.  Patient is on blood thinners.  She is at her baseline.  X-rays otherwise and CT of her neck were unremarkable.  I talked with Dr. Michale Age with neurosurgery.  Overall there is nothing really acutely to do about this.  Patient is DNR/DNI.  I talked with the family extensively about this.  Ultimately she is under 24/7 care at memory care.  Our hope was that this will take care of itself and not progress.  Does not much she would offer her if bleeding got worse from a surgical standpoint and ultimately if she had a major change in her mental status we could help make her comfortable may be from the ED if they do not have the services at her memory care facility.  Ultimately basic labs were checked her potassium was 2.6 on i-STAT we will see what that is on the BMP but will give oral repletion IV repletion.  Family updated on plan.  Will discharge back to facility.  Oral potassium for home.  Return if major change in her mental status.  Basic wound care instructions given for scalp abrasions.  This chart was dictated using voice recognition software.  Despite best efforts to proofread,  errors can occur which can change the documentation meaning.         Final Clinical Impression(s) / ED Diagnoses Final diagnoses:  Intracranial bleed (HCC)  Hypokalemia    Rx / DC Orders ED Discharge Orders     None         Lowery Rue, DO 01/13/24 1630

## 2024-01-13 NOTE — Discharge Instructions (Addendum)
 Patient did have a small volume traumatic head bleed today on her fall.  Clinically there is not much to do for this at this time.  She is not a surgical candidate.  Continue to monitor her mental status if she has severe decline in her mental status on the back for reevaluation.  Potassium was mildly low today as well this was repleted.  I have prescribed her some potassium to take for the next few days.  Patient has abrasion and hematoma to the back of the head.  There is no laceration.  No staples or sutures have been placed.  Just recommend wound dressing twice daily.  This area might bleed intermittently especially after taking showers or irritation to this area but she just wrapped with gauze and should continue to heal well.  Can consider bacitracin or Neosporin ointment twce daily to this area.  Just keep this area clean and dry and wash with soap and water to keep clean otherwise.

## 2024-01-13 NOTE — ED Provider Notes (Signed)
 Patient seen after prior EDP.  Patient and family understand plan of care.    [name] Carte, MD 01/13/24 [phone]

## 2024-01-13 NOTE — ED Notes (Signed)
 Pt biba for fall at facility, lac to back of head.  No LOC. Pt is not on thinners. Patient has hx of dementia. AAOX2 at baseline.

## 2024-01-14 DIAGNOSIS — M6281 Muscle weakness (generalized): Secondary | ICD-10-CM | POA: Diagnosis not present

## 2024-01-14 DIAGNOSIS — M6259 Muscle wasting and atrophy, not elsewhere classified, multiple sites: Secondary | ICD-10-CM | POA: Diagnosis not present

## 2024-01-14 DIAGNOSIS — R2681 Unsteadiness on feet: Secondary | ICD-10-CM | POA: Diagnosis not present

## 2024-01-15 DIAGNOSIS — R2681 Unsteadiness on feet: Secondary | ICD-10-CM | POA: Diagnosis not present

## 2024-01-15 DIAGNOSIS — M6281 Muscle weakness (generalized): Secondary | ICD-10-CM | POA: Diagnosis not present

## 2024-01-15 DIAGNOSIS — M6259 Muscle wasting and atrophy, not elsewhere classified, multiple sites: Secondary | ICD-10-CM | POA: Diagnosis not present

## 2024-01-18 DIAGNOSIS — M6281 Muscle weakness (generalized): Secondary | ICD-10-CM | POA: Diagnosis not present

## 2024-01-18 DIAGNOSIS — R2681 Unsteadiness on feet: Secondary | ICD-10-CM | POA: Diagnosis not present

## 2024-01-18 DIAGNOSIS — M6259 Muscle wasting and atrophy, not elsewhere classified, multiple sites: Secondary | ICD-10-CM | POA: Diagnosis not present

## 2024-01-19 DIAGNOSIS — M6259 Muscle wasting and atrophy, not elsewhere classified, multiple sites: Secondary | ICD-10-CM | POA: Diagnosis not present

## 2024-01-19 DIAGNOSIS — R2681 Unsteadiness on feet: Secondary | ICD-10-CM | POA: Diagnosis not present

## 2024-01-19 DIAGNOSIS — M6281 Muscle weakness (generalized): Secondary | ICD-10-CM | POA: Diagnosis not present

## 2024-01-20 DIAGNOSIS — M6259 Muscle wasting and atrophy, not elsewhere classified, multiple sites: Secondary | ICD-10-CM | POA: Diagnosis not present

## 2024-01-20 DIAGNOSIS — R2681 Unsteadiness on feet: Secondary | ICD-10-CM | POA: Diagnosis not present

## 2024-01-20 DIAGNOSIS — M6281 Muscle weakness (generalized): Secondary | ICD-10-CM | POA: Diagnosis not present

## 2024-01-21 DIAGNOSIS — R2681 Unsteadiness on feet: Secondary | ICD-10-CM | POA: Diagnosis not present

## 2024-01-21 DIAGNOSIS — M6281 Muscle weakness (generalized): Secondary | ICD-10-CM | POA: Diagnosis not present

## 2024-01-21 DIAGNOSIS — M6259 Muscle wasting and atrophy, not elsewhere classified, multiple sites: Secondary | ICD-10-CM | POA: Diagnosis not present

## 2024-01-24 DIAGNOSIS — M6259 Muscle wasting and atrophy, not elsewhere classified, multiple sites: Secondary | ICD-10-CM | POA: Diagnosis not present

## 2024-01-24 DIAGNOSIS — R2681 Unsteadiness on feet: Secondary | ICD-10-CM | POA: Diagnosis not present

## 2024-01-24 DIAGNOSIS — M6281 Muscle weakness (generalized): Secondary | ICD-10-CM | POA: Diagnosis not present

## 2024-01-26 DIAGNOSIS — M6281 Muscle weakness (generalized): Secondary | ICD-10-CM | POA: Diagnosis not present

## 2024-01-26 DIAGNOSIS — R2681 Unsteadiness on feet: Secondary | ICD-10-CM | POA: Diagnosis not present

## 2024-01-26 DIAGNOSIS — M6259 Muscle wasting and atrophy, not elsewhere classified, multiple sites: Secondary | ICD-10-CM | POA: Diagnosis not present

## 2024-02-14 DIAGNOSIS — R634 Abnormal weight loss: Secondary | ICD-10-CM | POA: Diagnosis not present

## 2024-02-14 DIAGNOSIS — M1611 Unilateral primary osteoarthritis, right hip: Secondary | ICD-10-CM | POA: Diagnosis not present

## 2024-02-14 DIAGNOSIS — G3183 Dementia with Lewy bodies: Secondary | ICD-10-CM | POA: Diagnosis not present

## 2024-02-14 DIAGNOSIS — N1832 Chronic kidney disease, stage 3b: Secondary | ICD-10-CM | POA: Diagnosis not present

## 2024-02-14 DIAGNOSIS — F39 Unspecified mood [affective] disorder: Secondary | ICD-10-CM | POA: Diagnosis not present

## 2024-02-14 DIAGNOSIS — F0283 Dementia in other diseases classified elsewhere, unspecified severity, with mood disturbance: Secondary | ICD-10-CM | POA: Diagnosis not present

## 2024-02-14 DIAGNOSIS — I129 Hypertensive chronic kidney disease with stage 1 through stage 4 chronic kidney disease, or unspecified chronic kidney disease: Secondary | ICD-10-CM | POA: Diagnosis not present

## 2024-02-14 DIAGNOSIS — M4696 Unspecified inflammatory spondylopathy, lumbar region: Secondary | ICD-10-CM | POA: Diagnosis not present

## 2024-02-14 DIAGNOSIS — M25551 Pain in right hip: Secondary | ICD-10-CM | POA: Diagnosis not present

## 2024-02-14 DIAGNOSIS — R296 Repeated falls: Secondary | ICD-10-CM | POA: Diagnosis not present

## 2024-02-14 DIAGNOSIS — E039 Hypothyroidism, unspecified: Secondary | ICD-10-CM | POA: Diagnosis not present

## 2024-02-28 DIAGNOSIS — D631 Anemia in chronic kidney disease: Secondary | ICD-10-CM | POA: Diagnosis not present

## 2024-02-28 DIAGNOSIS — S41112A Laceration without foreign body of left upper arm, initial encounter: Secondary | ICD-10-CM | POA: Diagnosis not present

## 2024-02-28 DIAGNOSIS — R296 Repeated falls: Secondary | ICD-10-CM | POA: Diagnosis not present

## 2024-02-28 DIAGNOSIS — F02B0 Dementia in other diseases classified elsewhere, moderate, without behavioral disturbance, psychotic disturbance, mood disturbance, and anxiety: Secondary | ICD-10-CM | POA: Diagnosis not present

## 2024-02-28 DIAGNOSIS — G3183 Dementia with Lewy bodies: Secondary | ICD-10-CM | POA: Diagnosis not present

## 2024-02-28 DIAGNOSIS — R634 Abnormal weight loss: Secondary | ICD-10-CM | POA: Diagnosis not present

## 2024-02-28 DIAGNOSIS — E559 Vitamin D deficiency, unspecified: Secondary | ICD-10-CM | POA: Diagnosis not present

## 2024-02-28 DIAGNOSIS — N1832 Chronic kidney disease, stage 3b: Secondary | ICD-10-CM | POA: Diagnosis not present

## 2024-02-28 DIAGNOSIS — E876 Hypokalemia: Secondary | ICD-10-CM | POA: Diagnosis not present

## 2024-02-28 DIAGNOSIS — K5900 Constipation, unspecified: Secondary | ICD-10-CM | POA: Diagnosis not present

## 2024-02-28 DIAGNOSIS — I129 Hypertensive chronic kidney disease with stage 1 through stage 4 chronic kidney disease, or unspecified chronic kidney disease: Secondary | ICD-10-CM | POA: Diagnosis not present

## 2024-02-29 DIAGNOSIS — G9340 Encephalopathy, unspecified: Secondary | ICD-10-CM | POA: Diagnosis not present

## 2024-02-29 DIAGNOSIS — M6281 Muscle weakness (generalized): Secondary | ICD-10-CM | POA: Diagnosis not present

## 2024-02-29 DIAGNOSIS — R2689 Other abnormalities of gait and mobility: Secondary | ICD-10-CM | POA: Diagnosis not present

## 2024-03-01 DIAGNOSIS — M6281 Muscle weakness (generalized): Secondary | ICD-10-CM | POA: Diagnosis not present

## 2024-03-01 DIAGNOSIS — R2689 Other abnormalities of gait and mobility: Secondary | ICD-10-CM | POA: Diagnosis not present

## 2024-03-01 DIAGNOSIS — G9340 Encephalopathy, unspecified: Secondary | ICD-10-CM | POA: Diagnosis not present

## 2024-03-02 DIAGNOSIS — M6281 Muscle weakness (generalized): Secondary | ICD-10-CM | POA: Diagnosis not present

## 2024-03-02 DIAGNOSIS — G9340 Encephalopathy, unspecified: Secondary | ICD-10-CM | POA: Diagnosis not present

## 2024-03-02 DIAGNOSIS — R2689 Other abnormalities of gait and mobility: Secondary | ICD-10-CM | POA: Diagnosis not present

## 2024-03-03 DIAGNOSIS — E559 Vitamin D deficiency, unspecified: Secondary | ICD-10-CM | POA: Diagnosis not present

## 2024-03-03 DIAGNOSIS — M6281 Muscle weakness (generalized): Secondary | ICD-10-CM | POA: Diagnosis not present

## 2024-03-03 DIAGNOSIS — E876 Hypokalemia: Secondary | ICD-10-CM | POA: Diagnosis not present

## 2024-03-03 DIAGNOSIS — G9340 Encephalopathy, unspecified: Secondary | ICD-10-CM | POA: Diagnosis not present

## 2024-03-03 DIAGNOSIS — R2689 Other abnormalities of gait and mobility: Secondary | ICD-10-CM | POA: Diagnosis not present

## 2024-03-03 DIAGNOSIS — D649 Anemia, unspecified: Secondary | ICD-10-CM | POA: Diagnosis not present

## 2024-03-06 DIAGNOSIS — G9340 Encephalopathy, unspecified: Secondary | ICD-10-CM | POA: Diagnosis not present

## 2024-03-06 DIAGNOSIS — M6281 Muscle weakness (generalized): Secondary | ICD-10-CM | POA: Diagnosis not present

## 2024-03-06 DIAGNOSIS — F03C2 Unspecified dementia, severe, with psychotic disturbance: Secondary | ICD-10-CM | POA: Diagnosis not present

## 2024-03-06 DIAGNOSIS — R2689 Other abnormalities of gait and mobility: Secondary | ICD-10-CM | POA: Diagnosis not present

## 2024-03-06 DIAGNOSIS — F5101 Primary insomnia: Secondary | ICD-10-CM | POA: Diagnosis not present

## 2024-03-07 DIAGNOSIS — R2689 Other abnormalities of gait and mobility: Secondary | ICD-10-CM | POA: Diagnosis not present

## 2024-03-07 DIAGNOSIS — M6281 Muscle weakness (generalized): Secondary | ICD-10-CM | POA: Diagnosis not present

## 2024-03-07 DIAGNOSIS — G9340 Encephalopathy, unspecified: Secondary | ICD-10-CM | POA: Diagnosis not present

## 2024-03-08 DIAGNOSIS — M6281 Muscle weakness (generalized): Secondary | ICD-10-CM | POA: Diagnosis not present

## 2024-03-08 DIAGNOSIS — G9340 Encephalopathy, unspecified: Secondary | ICD-10-CM | POA: Diagnosis not present

## 2024-03-08 DIAGNOSIS — R2689 Other abnormalities of gait and mobility: Secondary | ICD-10-CM | POA: Diagnosis not present

## 2024-03-09 DIAGNOSIS — R2689 Other abnormalities of gait and mobility: Secondary | ICD-10-CM | POA: Diagnosis not present

## 2024-03-09 DIAGNOSIS — M6281 Muscle weakness (generalized): Secondary | ICD-10-CM | POA: Diagnosis not present

## 2024-03-09 DIAGNOSIS — G9340 Encephalopathy, unspecified: Secondary | ICD-10-CM | POA: Diagnosis not present

## 2024-03-10 DIAGNOSIS — M6281 Muscle weakness (generalized): Secondary | ICD-10-CM | POA: Diagnosis not present

## 2024-03-10 DIAGNOSIS — R41841 Cognitive communication deficit: Secondary | ICD-10-CM | POA: Diagnosis not present

## 2024-03-10 DIAGNOSIS — R1311 Dysphagia, oral phase: Secondary | ICD-10-CM | POA: Diagnosis not present

## 2024-03-10 DIAGNOSIS — R2689 Other abnormalities of gait and mobility: Secondary | ICD-10-CM | POA: Diagnosis not present

## 2024-03-10 DIAGNOSIS — G3183 Dementia with Lewy bodies: Secondary | ICD-10-CM | POA: Diagnosis not present

## 2024-03-10 DIAGNOSIS — G9340 Encephalopathy, unspecified: Secondary | ICD-10-CM | POA: Diagnosis not present

## 2024-03-11 DIAGNOSIS — R41841 Cognitive communication deficit: Secondary | ICD-10-CM | POA: Diagnosis not present

## 2024-03-11 DIAGNOSIS — R2689 Other abnormalities of gait and mobility: Secondary | ICD-10-CM | POA: Diagnosis not present

## 2024-03-11 DIAGNOSIS — R1311 Dysphagia, oral phase: Secondary | ICD-10-CM | POA: Diagnosis not present

## 2024-03-11 DIAGNOSIS — G9340 Encephalopathy, unspecified: Secondary | ICD-10-CM | POA: Diagnosis not present

## 2024-03-11 DIAGNOSIS — G3183 Dementia with Lewy bodies: Secondary | ICD-10-CM | POA: Diagnosis not present

## 2024-03-11 DIAGNOSIS — M6281 Muscle weakness (generalized): Secondary | ICD-10-CM | POA: Diagnosis not present

## 2024-03-13 DIAGNOSIS — M6281 Muscle weakness (generalized): Secondary | ICD-10-CM | POA: Diagnosis not present

## 2024-03-13 DIAGNOSIS — R2689 Other abnormalities of gait and mobility: Secondary | ICD-10-CM | POA: Diagnosis not present

## 2024-03-13 DIAGNOSIS — G3183 Dementia with Lewy bodies: Secondary | ICD-10-CM | POA: Diagnosis not present

## 2024-03-13 DIAGNOSIS — G9340 Encephalopathy, unspecified: Secondary | ICD-10-CM | POA: Diagnosis not present

## 2024-03-13 DIAGNOSIS — R41841 Cognitive communication deficit: Secondary | ICD-10-CM | POA: Diagnosis not present

## 2024-03-13 DIAGNOSIS — R1311 Dysphagia, oral phase: Secondary | ICD-10-CM | POA: Diagnosis not present

## 2024-03-14 DIAGNOSIS — R41841 Cognitive communication deficit: Secondary | ICD-10-CM | POA: Diagnosis not present

## 2024-03-14 DIAGNOSIS — M6281 Muscle weakness (generalized): Secondary | ICD-10-CM | POA: Diagnosis not present

## 2024-03-14 DIAGNOSIS — G3183 Dementia with Lewy bodies: Secondary | ICD-10-CM | POA: Diagnosis not present

## 2024-03-14 DIAGNOSIS — R1311 Dysphagia, oral phase: Secondary | ICD-10-CM | POA: Diagnosis not present

## 2024-03-14 DIAGNOSIS — R2689 Other abnormalities of gait and mobility: Secondary | ICD-10-CM | POA: Diagnosis not present

## 2024-03-14 DIAGNOSIS — G9340 Encephalopathy, unspecified: Secondary | ICD-10-CM | POA: Diagnosis not present

## 2024-03-15 DIAGNOSIS — G3183 Dementia with Lewy bodies: Secondary | ICD-10-CM | POA: Diagnosis not present

## 2024-03-15 DIAGNOSIS — R2689 Other abnormalities of gait and mobility: Secondary | ICD-10-CM | POA: Diagnosis not present

## 2024-03-15 DIAGNOSIS — G9340 Encephalopathy, unspecified: Secondary | ICD-10-CM | POA: Diagnosis not present

## 2024-03-15 DIAGNOSIS — R41841 Cognitive communication deficit: Secondary | ICD-10-CM | POA: Diagnosis not present

## 2024-03-15 DIAGNOSIS — R1311 Dysphagia, oral phase: Secondary | ICD-10-CM | POA: Diagnosis not present

## 2024-03-15 DIAGNOSIS — M6281 Muscle weakness (generalized): Secondary | ICD-10-CM | POA: Diagnosis not present

## 2024-03-16 DIAGNOSIS — G9340 Encephalopathy, unspecified: Secondary | ICD-10-CM | POA: Diagnosis not present

## 2024-03-16 DIAGNOSIS — G3183 Dementia with Lewy bodies: Secondary | ICD-10-CM | POA: Diagnosis not present

## 2024-03-16 DIAGNOSIS — M6281 Muscle weakness (generalized): Secondary | ICD-10-CM | POA: Diagnosis not present

## 2024-03-16 DIAGNOSIS — R1311 Dysphagia, oral phase: Secondary | ICD-10-CM | POA: Diagnosis not present

## 2024-03-16 DIAGNOSIS — R41841 Cognitive communication deficit: Secondary | ICD-10-CM | POA: Diagnosis not present

## 2024-03-16 DIAGNOSIS — R2689 Other abnormalities of gait and mobility: Secondary | ICD-10-CM | POA: Diagnosis not present

## 2024-03-17 DIAGNOSIS — G9340 Encephalopathy, unspecified: Secondary | ICD-10-CM | POA: Diagnosis not present

## 2024-03-17 DIAGNOSIS — R1311 Dysphagia, oral phase: Secondary | ICD-10-CM | POA: Diagnosis not present

## 2024-03-17 DIAGNOSIS — G3183 Dementia with Lewy bodies: Secondary | ICD-10-CM | POA: Diagnosis not present

## 2024-03-17 DIAGNOSIS — R2689 Other abnormalities of gait and mobility: Secondary | ICD-10-CM | POA: Diagnosis not present

## 2024-03-17 DIAGNOSIS — M6281 Muscle weakness (generalized): Secondary | ICD-10-CM | POA: Diagnosis not present

## 2024-03-17 DIAGNOSIS — R41841 Cognitive communication deficit: Secondary | ICD-10-CM | POA: Diagnosis not present

## 2024-03-20 DIAGNOSIS — R1311 Dysphagia, oral phase: Secondary | ICD-10-CM | POA: Diagnosis not present

## 2024-03-20 DIAGNOSIS — M6281 Muscle weakness (generalized): Secondary | ICD-10-CM | POA: Diagnosis not present

## 2024-03-20 DIAGNOSIS — R41841 Cognitive communication deficit: Secondary | ICD-10-CM | POA: Diagnosis not present

## 2024-03-20 DIAGNOSIS — R2689 Other abnormalities of gait and mobility: Secondary | ICD-10-CM | POA: Diagnosis not present

## 2024-03-20 DIAGNOSIS — G3183 Dementia with Lewy bodies: Secondary | ICD-10-CM | POA: Diagnosis not present

## 2024-03-20 DIAGNOSIS — G9340 Encephalopathy, unspecified: Secondary | ICD-10-CM | POA: Diagnosis not present

## 2024-03-21 DIAGNOSIS — R1311 Dysphagia, oral phase: Secondary | ICD-10-CM | POA: Diagnosis not present

## 2024-03-21 DIAGNOSIS — M6281 Muscle weakness (generalized): Secondary | ICD-10-CM | POA: Diagnosis not present

## 2024-03-21 DIAGNOSIS — G9340 Encephalopathy, unspecified: Secondary | ICD-10-CM | POA: Diagnosis not present

## 2024-03-21 DIAGNOSIS — R2689 Other abnormalities of gait and mobility: Secondary | ICD-10-CM | POA: Diagnosis not present

## 2024-03-21 DIAGNOSIS — G3183 Dementia with Lewy bodies: Secondary | ICD-10-CM | POA: Diagnosis not present

## 2024-03-21 DIAGNOSIS — R41841 Cognitive communication deficit: Secondary | ICD-10-CM | POA: Diagnosis not present

## 2024-03-22 DIAGNOSIS — R41841 Cognitive communication deficit: Secondary | ICD-10-CM | POA: Diagnosis not present

## 2024-03-22 DIAGNOSIS — R2689 Other abnormalities of gait and mobility: Secondary | ICD-10-CM | POA: Diagnosis not present

## 2024-03-22 DIAGNOSIS — G9340 Encephalopathy, unspecified: Secondary | ICD-10-CM | POA: Diagnosis not present

## 2024-03-22 DIAGNOSIS — R1311 Dysphagia, oral phase: Secondary | ICD-10-CM | POA: Diagnosis not present

## 2024-03-22 DIAGNOSIS — G3183 Dementia with Lewy bodies: Secondary | ICD-10-CM | POA: Diagnosis not present

## 2024-03-22 DIAGNOSIS — M6281 Muscle weakness (generalized): Secondary | ICD-10-CM | POA: Diagnosis not present

## 2024-03-23 DIAGNOSIS — N390 Urinary tract infection, site not specified: Secondary | ICD-10-CM | POA: Diagnosis not present

## 2024-03-24 DIAGNOSIS — G3183 Dementia with Lewy bodies: Secondary | ICD-10-CM | POA: Diagnosis not present

## 2024-03-24 DIAGNOSIS — G9340 Encephalopathy, unspecified: Secondary | ICD-10-CM | POA: Diagnosis not present

## 2024-03-24 DIAGNOSIS — M6281 Muscle weakness (generalized): Secondary | ICD-10-CM | POA: Diagnosis not present

## 2024-03-24 DIAGNOSIS — R41841 Cognitive communication deficit: Secondary | ICD-10-CM | POA: Diagnosis not present

## 2024-03-24 DIAGNOSIS — R2689 Other abnormalities of gait and mobility: Secondary | ICD-10-CM | POA: Diagnosis not present

## 2024-03-24 DIAGNOSIS — R1311 Dysphagia, oral phase: Secondary | ICD-10-CM | POA: Diagnosis not present

## 2024-03-27 DIAGNOSIS — M25551 Pain in right hip: Secondary | ICD-10-CM | POA: Diagnosis not present

## 2024-03-27 DIAGNOSIS — R41841 Cognitive communication deficit: Secondary | ICD-10-CM | POA: Diagnosis not present

## 2024-03-27 DIAGNOSIS — R2689 Other abnormalities of gait and mobility: Secondary | ICD-10-CM | POA: Diagnosis not present

## 2024-03-27 DIAGNOSIS — G9340 Encephalopathy, unspecified: Secondary | ICD-10-CM | POA: Diagnosis not present

## 2024-03-27 DIAGNOSIS — M6281 Muscle weakness (generalized): Secondary | ICD-10-CM | POA: Diagnosis not present

## 2024-03-27 DIAGNOSIS — R1311 Dysphagia, oral phase: Secondary | ICD-10-CM | POA: Diagnosis not present

## 2024-03-27 DIAGNOSIS — G3183 Dementia with Lewy bodies: Secondary | ICD-10-CM | POA: Diagnosis not present

## 2024-03-28 DIAGNOSIS — G3183 Dementia with Lewy bodies: Secondary | ICD-10-CM | POA: Diagnosis not present

## 2024-03-28 DIAGNOSIS — R41841 Cognitive communication deficit: Secondary | ICD-10-CM | POA: Diagnosis not present

## 2024-03-28 DIAGNOSIS — M6281 Muscle weakness (generalized): Secondary | ICD-10-CM | POA: Diagnosis not present

## 2024-03-28 DIAGNOSIS — R2689 Other abnormalities of gait and mobility: Secondary | ICD-10-CM | POA: Diagnosis not present

## 2024-03-28 DIAGNOSIS — G9340 Encephalopathy, unspecified: Secondary | ICD-10-CM | POA: Diagnosis not present

## 2024-03-28 DIAGNOSIS — R1311 Dysphagia, oral phase: Secondary | ICD-10-CM | POA: Diagnosis not present

## 2024-03-29 DIAGNOSIS — R1311 Dysphagia, oral phase: Secondary | ICD-10-CM | POA: Diagnosis not present

## 2024-03-29 DIAGNOSIS — R2689 Other abnormalities of gait and mobility: Secondary | ICD-10-CM | POA: Diagnosis not present

## 2024-03-29 DIAGNOSIS — M6281 Muscle weakness (generalized): Secondary | ICD-10-CM | POA: Diagnosis not present

## 2024-03-29 DIAGNOSIS — R41841 Cognitive communication deficit: Secondary | ICD-10-CM | POA: Diagnosis not present

## 2024-03-29 DIAGNOSIS — G9340 Encephalopathy, unspecified: Secondary | ICD-10-CM | POA: Diagnosis not present

## 2024-03-29 DIAGNOSIS — G3183 Dementia with Lewy bodies: Secondary | ICD-10-CM | POA: Diagnosis not present

## 2024-03-30 DIAGNOSIS — N390 Urinary tract infection, site not specified: Secondary | ICD-10-CM | POA: Diagnosis not present

## 2024-03-30 DIAGNOSIS — N1832 Chronic kidney disease, stage 3b: Secondary | ICD-10-CM | POA: Diagnosis not present

## 2024-03-30 DIAGNOSIS — F02B3 Dementia in other diseases classified elsewhere, moderate, with mood disturbance: Secondary | ICD-10-CM | POA: Diagnosis not present

## 2024-03-30 DIAGNOSIS — I129 Hypertensive chronic kidney disease with stage 1 through stage 4 chronic kidney disease, or unspecified chronic kidney disease: Secondary | ICD-10-CM | POA: Diagnosis not present

## 2024-03-30 DIAGNOSIS — G3183 Dementia with Lewy bodies: Secondary | ICD-10-CM | POA: Diagnosis not present

## 2024-03-30 DIAGNOSIS — F331 Major depressive disorder, recurrent, moderate: Secondary | ICD-10-CM | POA: Diagnosis not present

## 2024-03-30 DIAGNOSIS — I959 Hypotension, unspecified: Secondary | ICD-10-CM | POA: Diagnosis not present

## 2024-03-31 DIAGNOSIS — M6281 Muscle weakness (generalized): Secondary | ICD-10-CM | POA: Diagnosis not present

## 2024-03-31 DIAGNOSIS — R41841 Cognitive communication deficit: Secondary | ICD-10-CM | POA: Diagnosis not present

## 2024-03-31 DIAGNOSIS — G9340 Encephalopathy, unspecified: Secondary | ICD-10-CM | POA: Diagnosis not present

## 2024-03-31 DIAGNOSIS — G3183 Dementia with Lewy bodies: Secondary | ICD-10-CM | POA: Diagnosis not present

## 2024-03-31 DIAGNOSIS — R1311 Dysphagia, oral phase: Secondary | ICD-10-CM | POA: Diagnosis not present

## 2024-03-31 DIAGNOSIS — R2689 Other abnormalities of gait and mobility: Secondary | ICD-10-CM | POA: Diagnosis not present

## 2024-04-01 DIAGNOSIS — I10 Essential (primary) hypertension: Secondary | ICD-10-CM | POA: Diagnosis not present

## 2024-04-03 DIAGNOSIS — R2689 Other abnormalities of gait and mobility: Secondary | ICD-10-CM | POA: Diagnosis not present

## 2024-04-03 DIAGNOSIS — R41841 Cognitive communication deficit: Secondary | ICD-10-CM | POA: Diagnosis not present

## 2024-04-03 DIAGNOSIS — M6281 Muscle weakness (generalized): Secondary | ICD-10-CM | POA: Diagnosis not present

## 2024-04-03 DIAGNOSIS — G3183 Dementia with Lewy bodies: Secondary | ICD-10-CM | POA: Diagnosis not present

## 2024-04-03 DIAGNOSIS — G9340 Encephalopathy, unspecified: Secondary | ICD-10-CM | POA: Diagnosis not present

## 2024-04-03 DIAGNOSIS — R1311 Dysphagia, oral phase: Secondary | ICD-10-CM | POA: Diagnosis not present

## 2024-04-04 DIAGNOSIS — R2689 Other abnormalities of gait and mobility: Secondary | ICD-10-CM | POA: Diagnosis not present

## 2024-04-04 DIAGNOSIS — R41841 Cognitive communication deficit: Secondary | ICD-10-CM | POA: Diagnosis not present

## 2024-04-04 DIAGNOSIS — R1311 Dysphagia, oral phase: Secondary | ICD-10-CM | POA: Diagnosis not present

## 2024-04-04 DIAGNOSIS — G9340 Encephalopathy, unspecified: Secondary | ICD-10-CM | POA: Diagnosis not present

## 2024-04-04 DIAGNOSIS — M6281 Muscle weakness (generalized): Secondary | ICD-10-CM | POA: Diagnosis not present

## 2024-04-04 DIAGNOSIS — G3183 Dementia with Lewy bodies: Secondary | ICD-10-CM | POA: Diagnosis not present

## 2024-04-04 DIAGNOSIS — I10 Essential (primary) hypertension: Secondary | ICD-10-CM | POA: Diagnosis not present

## 2024-04-05 DIAGNOSIS — R41841 Cognitive communication deficit: Secondary | ICD-10-CM | POA: Diagnosis not present

## 2024-04-05 DIAGNOSIS — M6281 Muscle weakness (generalized): Secondary | ICD-10-CM | POA: Diagnosis not present

## 2024-04-05 DIAGNOSIS — R2689 Other abnormalities of gait and mobility: Secondary | ICD-10-CM | POA: Diagnosis not present

## 2024-04-05 DIAGNOSIS — G3183 Dementia with Lewy bodies: Secondary | ICD-10-CM | POA: Diagnosis not present

## 2024-04-05 DIAGNOSIS — G9340 Encephalopathy, unspecified: Secondary | ICD-10-CM | POA: Diagnosis not present

## 2024-04-05 DIAGNOSIS — R1311 Dysphagia, oral phase: Secondary | ICD-10-CM | POA: Diagnosis not present

## 2024-04-06 DIAGNOSIS — G3183 Dementia with Lewy bodies: Secondary | ICD-10-CM | POA: Diagnosis not present

## 2024-04-06 DIAGNOSIS — M6281 Muscle weakness (generalized): Secondary | ICD-10-CM | POA: Diagnosis not present

## 2024-04-06 DIAGNOSIS — G9340 Encephalopathy, unspecified: Secondary | ICD-10-CM | POA: Diagnosis not present

## 2024-04-06 DIAGNOSIS — R1311 Dysphagia, oral phase: Secondary | ICD-10-CM | POA: Diagnosis not present

## 2024-04-06 DIAGNOSIS — R2689 Other abnormalities of gait and mobility: Secondary | ICD-10-CM | POA: Diagnosis not present

## 2024-04-06 DIAGNOSIS — R41841 Cognitive communication deficit: Secondary | ICD-10-CM | POA: Diagnosis not present

## 2024-04-07 DIAGNOSIS — R1311 Dysphagia, oral phase: Secondary | ICD-10-CM | POA: Diagnosis not present

## 2024-04-07 DIAGNOSIS — R41841 Cognitive communication deficit: Secondary | ICD-10-CM | POA: Diagnosis not present

## 2024-04-07 DIAGNOSIS — G9340 Encephalopathy, unspecified: Secondary | ICD-10-CM | POA: Diagnosis not present

## 2024-04-07 DIAGNOSIS — G3183 Dementia with Lewy bodies: Secondary | ICD-10-CM | POA: Diagnosis not present

## 2024-04-07 DIAGNOSIS — M6281 Muscle weakness (generalized): Secondary | ICD-10-CM | POA: Diagnosis not present

## 2024-04-07 DIAGNOSIS — R2689 Other abnormalities of gait and mobility: Secondary | ICD-10-CM | POA: Diagnosis not present

## 2024-04-10 DIAGNOSIS — R2689 Other abnormalities of gait and mobility: Secondary | ICD-10-CM | POA: Diagnosis not present

## 2024-04-10 DIAGNOSIS — R41841 Cognitive communication deficit: Secondary | ICD-10-CM | POA: Diagnosis not present

## 2024-04-10 DIAGNOSIS — R1311 Dysphagia, oral phase: Secondary | ICD-10-CM | POA: Diagnosis not present

## 2024-04-10 DIAGNOSIS — G3183 Dementia with Lewy bodies: Secondary | ICD-10-CM | POA: Diagnosis not present

## 2024-04-10 DIAGNOSIS — M6281 Muscle weakness (generalized): Secondary | ICD-10-CM | POA: Diagnosis not present

## 2024-04-10 DIAGNOSIS — G9340 Encephalopathy, unspecified: Secondary | ICD-10-CM | POA: Diagnosis not present

## 2024-04-11 DIAGNOSIS — R2689 Other abnormalities of gait and mobility: Secondary | ICD-10-CM | POA: Diagnosis not present

## 2024-04-11 DIAGNOSIS — G3183 Dementia with Lewy bodies: Secondary | ICD-10-CM | POA: Diagnosis not present

## 2024-04-11 DIAGNOSIS — R41841 Cognitive communication deficit: Secondary | ICD-10-CM | POA: Diagnosis not present

## 2024-04-11 DIAGNOSIS — G9340 Encephalopathy, unspecified: Secondary | ICD-10-CM | POA: Diagnosis not present

## 2024-04-11 DIAGNOSIS — M6281 Muscle weakness (generalized): Secondary | ICD-10-CM | POA: Diagnosis not present

## 2024-04-11 DIAGNOSIS — R1311 Dysphagia, oral phase: Secondary | ICD-10-CM | POA: Diagnosis not present

## 2024-04-12 DIAGNOSIS — M6281 Muscle weakness (generalized): Secondary | ICD-10-CM | POA: Diagnosis not present

## 2024-04-12 DIAGNOSIS — G3183 Dementia with Lewy bodies: Secondary | ICD-10-CM | POA: Diagnosis not present

## 2024-04-12 DIAGNOSIS — G9340 Encephalopathy, unspecified: Secondary | ICD-10-CM | POA: Diagnosis not present

## 2024-04-12 DIAGNOSIS — R2689 Other abnormalities of gait and mobility: Secondary | ICD-10-CM | POA: Diagnosis not present

## 2024-04-12 DIAGNOSIS — R41841 Cognitive communication deficit: Secondary | ICD-10-CM | POA: Diagnosis not present

## 2024-04-12 DIAGNOSIS — R1311 Dysphagia, oral phase: Secondary | ICD-10-CM | POA: Diagnosis not present

## 2024-04-13 DIAGNOSIS — R1311 Dysphagia, oral phase: Secondary | ICD-10-CM | POA: Diagnosis not present

## 2024-04-13 DIAGNOSIS — M6281 Muscle weakness (generalized): Secondary | ICD-10-CM | POA: Diagnosis not present

## 2024-04-13 DIAGNOSIS — G3183 Dementia with Lewy bodies: Secondary | ICD-10-CM | POA: Diagnosis not present

## 2024-04-13 DIAGNOSIS — R2689 Other abnormalities of gait and mobility: Secondary | ICD-10-CM | POA: Diagnosis not present

## 2024-04-13 DIAGNOSIS — G9340 Encephalopathy, unspecified: Secondary | ICD-10-CM | POA: Diagnosis not present

## 2024-04-13 DIAGNOSIS — E559 Vitamin D deficiency, unspecified: Secondary | ICD-10-CM | POA: Diagnosis not present

## 2024-04-13 DIAGNOSIS — R41841 Cognitive communication deficit: Secondary | ICD-10-CM | POA: Diagnosis not present

## 2024-04-14 DIAGNOSIS — R1311 Dysphagia, oral phase: Secondary | ICD-10-CM | POA: Diagnosis not present

## 2024-04-14 DIAGNOSIS — G3183 Dementia with Lewy bodies: Secondary | ICD-10-CM | POA: Diagnosis not present

## 2024-04-14 DIAGNOSIS — E1122 Type 2 diabetes mellitus with diabetic chronic kidney disease: Secondary | ICD-10-CM | POA: Diagnosis not present

## 2024-04-14 DIAGNOSIS — R2689 Other abnormalities of gait and mobility: Secondary | ICD-10-CM | POA: Diagnosis not present

## 2024-04-14 DIAGNOSIS — F02811 Dementia in other diseases classified elsewhere, unspecified severity, with agitation: Secondary | ICD-10-CM | POA: Diagnosis not present

## 2024-04-14 DIAGNOSIS — M6281 Muscle weakness (generalized): Secondary | ICD-10-CM | POA: Diagnosis not present

## 2024-04-14 DIAGNOSIS — F0282 Dementia in other diseases classified elsewhere, unspecified severity, with psychotic disturbance: Secondary | ICD-10-CM | POA: Diagnosis not present

## 2024-04-14 DIAGNOSIS — R600 Localized edema: Secondary | ICD-10-CM | POA: Diagnosis not present

## 2024-04-14 DIAGNOSIS — N184 Chronic kidney disease, stage 4 (severe): Secondary | ICD-10-CM | POA: Diagnosis not present

## 2024-04-14 DIAGNOSIS — F331 Major depressive disorder, recurrent, moderate: Secondary | ICD-10-CM | POA: Diagnosis not present

## 2024-04-14 DIAGNOSIS — E039 Hypothyroidism, unspecified: Secondary | ICD-10-CM | POA: Diagnosis not present

## 2024-04-14 DIAGNOSIS — R296 Repeated falls: Secondary | ICD-10-CM | POA: Diagnosis not present

## 2024-04-14 DIAGNOSIS — G9340 Encephalopathy, unspecified: Secondary | ICD-10-CM | POA: Diagnosis not present

## 2024-04-14 DIAGNOSIS — R41841 Cognitive communication deficit: Secondary | ICD-10-CM | POA: Diagnosis not present

## 2024-04-14 DIAGNOSIS — F0283 Dementia in other diseases classified elsewhere, unspecified severity, with mood disturbance: Secondary | ICD-10-CM | POA: Diagnosis not present

## 2024-04-17 DIAGNOSIS — R41841 Cognitive communication deficit: Secondary | ICD-10-CM | POA: Diagnosis not present

## 2024-04-17 DIAGNOSIS — R2689 Other abnormalities of gait and mobility: Secondary | ICD-10-CM | POA: Diagnosis not present

## 2024-04-17 DIAGNOSIS — F5101 Primary insomnia: Secondary | ICD-10-CM | POA: Diagnosis not present

## 2024-04-17 DIAGNOSIS — M6281 Muscle weakness (generalized): Secondary | ICD-10-CM | POA: Diagnosis not present

## 2024-04-17 DIAGNOSIS — G3183 Dementia with Lewy bodies: Secondary | ICD-10-CM | POA: Diagnosis not present

## 2024-04-17 DIAGNOSIS — R1311 Dysphagia, oral phase: Secondary | ICD-10-CM | POA: Diagnosis not present

## 2024-04-17 DIAGNOSIS — F03C2 Unspecified dementia, severe, with psychotic disturbance: Secondary | ICD-10-CM | POA: Diagnosis not present

## 2024-04-17 DIAGNOSIS — G9340 Encephalopathy, unspecified: Secondary | ICD-10-CM | POA: Diagnosis not present

## 2024-04-18 DIAGNOSIS — R1311 Dysphagia, oral phase: Secondary | ICD-10-CM | POA: Diagnosis not present

## 2024-04-18 DIAGNOSIS — R2689 Other abnormalities of gait and mobility: Secondary | ICD-10-CM | POA: Diagnosis not present

## 2024-04-18 DIAGNOSIS — G9340 Encephalopathy, unspecified: Secondary | ICD-10-CM | POA: Diagnosis not present

## 2024-04-18 DIAGNOSIS — G3183 Dementia with Lewy bodies: Secondary | ICD-10-CM | POA: Diagnosis not present

## 2024-04-18 DIAGNOSIS — R41841 Cognitive communication deficit: Secondary | ICD-10-CM | POA: Diagnosis not present

## 2024-04-18 DIAGNOSIS — M6281 Muscle weakness (generalized): Secondary | ICD-10-CM | POA: Diagnosis not present

## 2024-04-19 DIAGNOSIS — R41841 Cognitive communication deficit: Secondary | ICD-10-CM | POA: Diagnosis not present

## 2024-04-19 DIAGNOSIS — M6281 Muscle weakness (generalized): Secondary | ICD-10-CM | POA: Diagnosis not present

## 2024-04-19 DIAGNOSIS — R2689 Other abnormalities of gait and mobility: Secondary | ICD-10-CM | POA: Diagnosis not present

## 2024-04-19 DIAGNOSIS — G3183 Dementia with Lewy bodies: Secondary | ICD-10-CM | POA: Diagnosis not present

## 2024-04-19 DIAGNOSIS — G9340 Encephalopathy, unspecified: Secondary | ICD-10-CM | POA: Diagnosis not present

## 2024-04-19 DIAGNOSIS — R1311 Dysphagia, oral phase: Secondary | ICD-10-CM | POA: Diagnosis not present

## 2024-04-21 DIAGNOSIS — R41841 Cognitive communication deficit: Secondary | ICD-10-CM | POA: Diagnosis not present

## 2024-04-21 DIAGNOSIS — R1311 Dysphagia, oral phase: Secondary | ICD-10-CM | POA: Diagnosis not present

## 2024-04-21 DIAGNOSIS — R2689 Other abnormalities of gait and mobility: Secondary | ICD-10-CM | POA: Diagnosis not present

## 2024-04-21 DIAGNOSIS — M6281 Muscle weakness (generalized): Secondary | ICD-10-CM | POA: Diagnosis not present

## 2024-04-21 DIAGNOSIS — G9340 Encephalopathy, unspecified: Secondary | ICD-10-CM | POA: Diagnosis not present

## 2024-04-21 DIAGNOSIS — G3183 Dementia with Lewy bodies: Secondary | ICD-10-CM | POA: Diagnosis not present

## 2024-04-25 DIAGNOSIS — M6281 Muscle weakness (generalized): Secondary | ICD-10-CM | POA: Diagnosis not present

## 2024-04-25 DIAGNOSIS — R41841 Cognitive communication deficit: Secondary | ICD-10-CM | POA: Diagnosis not present

## 2024-04-25 DIAGNOSIS — G9340 Encephalopathy, unspecified: Secondary | ICD-10-CM | POA: Diagnosis not present

## 2024-04-25 DIAGNOSIS — R2689 Other abnormalities of gait and mobility: Secondary | ICD-10-CM | POA: Diagnosis not present

## 2024-04-25 DIAGNOSIS — R1311 Dysphagia, oral phase: Secondary | ICD-10-CM | POA: Diagnosis not present

## 2024-04-25 DIAGNOSIS — G3183 Dementia with Lewy bodies: Secondary | ICD-10-CM | POA: Diagnosis not present

## 2024-04-27 DIAGNOSIS — R1311 Dysphagia, oral phase: Secondary | ICD-10-CM | POA: Diagnosis not present

## 2024-04-27 DIAGNOSIS — M6281 Muscle weakness (generalized): Secondary | ICD-10-CM | POA: Diagnosis not present

## 2024-04-27 DIAGNOSIS — R2689 Other abnormalities of gait and mobility: Secondary | ICD-10-CM | POA: Diagnosis not present

## 2024-04-27 DIAGNOSIS — R41841 Cognitive communication deficit: Secondary | ICD-10-CM | POA: Diagnosis not present

## 2024-04-27 DIAGNOSIS — G9340 Encephalopathy, unspecified: Secondary | ICD-10-CM | POA: Diagnosis not present

## 2024-04-27 DIAGNOSIS — G3183 Dementia with Lewy bodies: Secondary | ICD-10-CM | POA: Diagnosis not present

## 2024-04-28 DIAGNOSIS — R1311 Dysphagia, oral phase: Secondary | ICD-10-CM | POA: Diagnosis not present

## 2024-04-28 DIAGNOSIS — M6281 Muscle weakness (generalized): Secondary | ICD-10-CM | POA: Diagnosis not present

## 2024-04-28 DIAGNOSIS — G3183 Dementia with Lewy bodies: Secondary | ICD-10-CM | POA: Diagnosis not present

## 2024-04-28 DIAGNOSIS — R2689 Other abnormalities of gait and mobility: Secondary | ICD-10-CM | POA: Diagnosis not present

## 2024-04-28 DIAGNOSIS — G9340 Encephalopathy, unspecified: Secondary | ICD-10-CM | POA: Diagnosis not present

## 2024-04-28 DIAGNOSIS — R41841 Cognitive communication deficit: Secondary | ICD-10-CM | POA: Diagnosis not present

## 2024-05-01 DIAGNOSIS — R1311 Dysphagia, oral phase: Secondary | ICD-10-CM | POA: Diagnosis not present

## 2024-05-01 DIAGNOSIS — G3183 Dementia with Lewy bodies: Secondary | ICD-10-CM | POA: Diagnosis not present

## 2024-05-01 DIAGNOSIS — G9340 Encephalopathy, unspecified: Secondary | ICD-10-CM | POA: Diagnosis not present

## 2024-05-01 DIAGNOSIS — M6281 Muscle weakness (generalized): Secondary | ICD-10-CM | POA: Diagnosis not present

## 2024-05-01 DIAGNOSIS — R41841 Cognitive communication deficit: Secondary | ICD-10-CM | POA: Diagnosis not present

## 2024-05-01 DIAGNOSIS — R2689 Other abnormalities of gait and mobility: Secondary | ICD-10-CM | POA: Diagnosis not present

## 2024-05-02 DIAGNOSIS — R41841 Cognitive communication deficit: Secondary | ICD-10-CM | POA: Diagnosis not present

## 2024-05-02 DIAGNOSIS — M6281 Muscle weakness (generalized): Secondary | ICD-10-CM | POA: Diagnosis not present

## 2024-05-02 DIAGNOSIS — R1311 Dysphagia, oral phase: Secondary | ICD-10-CM | POA: Diagnosis not present

## 2024-05-02 DIAGNOSIS — G3183 Dementia with Lewy bodies: Secondary | ICD-10-CM | POA: Diagnosis not present

## 2024-05-02 DIAGNOSIS — G9340 Encephalopathy, unspecified: Secondary | ICD-10-CM | POA: Diagnosis not present

## 2024-05-02 DIAGNOSIS — R2689 Other abnormalities of gait and mobility: Secondary | ICD-10-CM | POA: Diagnosis not present

## 2024-05-03 DIAGNOSIS — R2689 Other abnormalities of gait and mobility: Secondary | ICD-10-CM | POA: Diagnosis not present

## 2024-05-03 DIAGNOSIS — M6281 Muscle weakness (generalized): Secondary | ICD-10-CM | POA: Diagnosis not present

## 2024-05-03 DIAGNOSIS — R1311 Dysphagia, oral phase: Secondary | ICD-10-CM | POA: Diagnosis not present

## 2024-05-03 DIAGNOSIS — G9340 Encephalopathy, unspecified: Secondary | ICD-10-CM | POA: Diagnosis not present

## 2024-05-03 DIAGNOSIS — R41841 Cognitive communication deficit: Secondary | ICD-10-CM | POA: Diagnosis not present

## 2024-05-03 DIAGNOSIS — G3183 Dementia with Lewy bodies: Secondary | ICD-10-CM | POA: Diagnosis not present

## 2024-05-04 DIAGNOSIS — R2689 Other abnormalities of gait and mobility: Secondary | ICD-10-CM | POA: Diagnosis not present

## 2024-05-04 DIAGNOSIS — M6281 Muscle weakness (generalized): Secondary | ICD-10-CM | POA: Diagnosis not present

## 2024-05-04 DIAGNOSIS — R1311 Dysphagia, oral phase: Secondary | ICD-10-CM | POA: Diagnosis not present

## 2024-05-04 DIAGNOSIS — G3183 Dementia with Lewy bodies: Secondary | ICD-10-CM | POA: Diagnosis not present

## 2024-05-04 DIAGNOSIS — R41841 Cognitive communication deficit: Secondary | ICD-10-CM | POA: Diagnosis not present

## 2024-05-04 DIAGNOSIS — G9340 Encephalopathy, unspecified: Secondary | ICD-10-CM | POA: Diagnosis not present

## 2024-05-05 DIAGNOSIS — R2689 Other abnormalities of gait and mobility: Secondary | ICD-10-CM | POA: Diagnosis not present

## 2024-05-05 DIAGNOSIS — G3183 Dementia with Lewy bodies: Secondary | ICD-10-CM | POA: Diagnosis not present

## 2024-05-05 DIAGNOSIS — M6281 Muscle weakness (generalized): Secondary | ICD-10-CM | POA: Diagnosis not present

## 2024-05-05 DIAGNOSIS — R1311 Dysphagia, oral phase: Secondary | ICD-10-CM | POA: Diagnosis not present

## 2024-05-05 DIAGNOSIS — R41841 Cognitive communication deficit: Secondary | ICD-10-CM | POA: Diagnosis not present

## 2024-05-05 DIAGNOSIS — G9340 Encephalopathy, unspecified: Secondary | ICD-10-CM | POA: Diagnosis not present

## 2024-05-08 DIAGNOSIS — G9340 Encephalopathy, unspecified: Secondary | ICD-10-CM | POA: Diagnosis not present

## 2024-05-08 DIAGNOSIS — M6281 Muscle weakness (generalized): Secondary | ICD-10-CM | POA: Diagnosis not present

## 2024-05-08 DIAGNOSIS — R1311 Dysphagia, oral phase: Secondary | ICD-10-CM | POA: Diagnosis not present

## 2024-05-08 DIAGNOSIS — R41841 Cognitive communication deficit: Secondary | ICD-10-CM | POA: Diagnosis not present

## 2024-05-08 DIAGNOSIS — G3183 Dementia with Lewy bodies: Secondary | ICD-10-CM | POA: Diagnosis not present

## 2024-05-08 DIAGNOSIS — R2689 Other abnormalities of gait and mobility: Secondary | ICD-10-CM | POA: Diagnosis not present

## 2024-05-09 DIAGNOSIS — R1311 Dysphagia, oral phase: Secondary | ICD-10-CM | POA: Diagnosis not present

## 2024-05-09 DIAGNOSIS — M6281 Muscle weakness (generalized): Secondary | ICD-10-CM | POA: Diagnosis not present

## 2024-05-09 DIAGNOSIS — R2689 Other abnormalities of gait and mobility: Secondary | ICD-10-CM | POA: Diagnosis not present

## 2024-05-09 DIAGNOSIS — G3183 Dementia with Lewy bodies: Secondary | ICD-10-CM | POA: Diagnosis not present

## 2024-05-09 DIAGNOSIS — R41841 Cognitive communication deficit: Secondary | ICD-10-CM | POA: Diagnosis not present

## 2024-05-09 DIAGNOSIS — G9340 Encephalopathy, unspecified: Secondary | ICD-10-CM | POA: Diagnosis not present

## 2024-05-10 DIAGNOSIS — R41841 Cognitive communication deficit: Secondary | ICD-10-CM | POA: Diagnosis not present

## 2024-05-10 DIAGNOSIS — G3183 Dementia with Lewy bodies: Secondary | ICD-10-CM | POA: Diagnosis not present

## 2024-05-10 DIAGNOSIS — R1311 Dysphagia, oral phase: Secondary | ICD-10-CM | POA: Diagnosis not present

## 2024-05-11 DIAGNOSIS — R41841 Cognitive communication deficit: Secondary | ICD-10-CM | POA: Diagnosis not present

## 2024-05-11 DIAGNOSIS — R1311 Dysphagia, oral phase: Secondary | ICD-10-CM | POA: Diagnosis not present

## 2024-05-11 DIAGNOSIS — G3183 Dementia with Lewy bodies: Secondary | ICD-10-CM | POA: Diagnosis not present

## 2024-05-12 DIAGNOSIS — R1311 Dysphagia, oral phase: Secondary | ICD-10-CM | POA: Diagnosis not present

## 2024-05-12 DIAGNOSIS — G3183 Dementia with Lewy bodies: Secondary | ICD-10-CM | POA: Diagnosis not present

## 2024-05-12 DIAGNOSIS — R41841 Cognitive communication deficit: Secondary | ICD-10-CM | POA: Diagnosis not present

## 2024-05-15 DIAGNOSIS — R1311 Dysphagia, oral phase: Secondary | ICD-10-CM | POA: Diagnosis not present

## 2024-05-15 DIAGNOSIS — G3183 Dementia with Lewy bodies: Secondary | ICD-10-CM | POA: Diagnosis not present

## 2024-05-15 DIAGNOSIS — R41841 Cognitive communication deficit: Secondary | ICD-10-CM | POA: Diagnosis not present

## 2024-06-19 DIAGNOSIS — F5101 Primary insomnia: Secondary | ICD-10-CM | POA: Diagnosis not present

## 2024-06-19 DIAGNOSIS — F03C2 Unspecified dementia, severe, with psychotic disturbance: Secondary | ICD-10-CM | POA: Diagnosis not present

## 2024-06-22 DIAGNOSIS — I10 Essential (primary) hypertension: Secondary | ICD-10-CM | POA: Diagnosis not present

## 2024-06-22 DIAGNOSIS — E1122 Type 2 diabetes mellitus with diabetic chronic kidney disease: Secondary | ICD-10-CM | POA: Diagnosis not present

## 2024-06-22 DIAGNOSIS — G3183 Dementia with Lewy bodies: Secondary | ICD-10-CM | POA: Diagnosis not present

## 2024-06-22 DIAGNOSIS — R296 Repeated falls: Secondary | ICD-10-CM | POA: Diagnosis not present

## 2024-06-22 DIAGNOSIS — F331 Major depressive disorder, recurrent, moderate: Secondary | ICD-10-CM | POA: Diagnosis not present

## 2024-06-22 DIAGNOSIS — F0283 Dementia in other diseases classified elsewhere, unspecified severity, with mood disturbance: Secondary | ICD-10-CM | POA: Diagnosis not present

## 2024-06-22 DIAGNOSIS — R634 Abnormal weight loss: Secondary | ICD-10-CM | POA: Diagnosis not present

## 2024-06-22 DIAGNOSIS — N184 Chronic kidney disease, stage 4 (severe): Secondary | ICD-10-CM | POA: Diagnosis not present

## 2024-06-22 DIAGNOSIS — E039 Hypothyroidism, unspecified: Secondary | ICD-10-CM | POA: Diagnosis not present

## 2024-06-22 DIAGNOSIS — R600 Localized edema: Secondary | ICD-10-CM | POA: Diagnosis not present

## 2024-06-22 DIAGNOSIS — Z681 Body mass index (BMI) 19 or less, adult: Secondary | ICD-10-CM | POA: Diagnosis not present

## 2024-06-23 DIAGNOSIS — R8290 Unspecified abnormal findings in urine: Secondary | ICD-10-CM | POA: Diagnosis not present

## 2024-06-23 DIAGNOSIS — R8281 Pyuria: Secondary | ICD-10-CM | POA: Diagnosis not present

## 2024-06-28 ENCOUNTER — Inpatient Hospital Stay (HOSPITAL_COMMUNITY)
Admission: EM | Admit: 2024-06-28 | Discharge: 2024-07-01 | DRG: 682 | Disposition: A | Discharge status: Skilled Nursing Facility | Source: Skilled Nursing Facility | Attending: Internal Medicine | Admitting: Internal Medicine

## 2024-06-28 ENCOUNTER — Emergency Department (HOSPITAL_COMMUNITY)

## 2024-06-28 ENCOUNTER — Other Ambulatory Visit: Payer: Self-pay

## 2024-06-28 ENCOUNTER — Encounter (HOSPITAL_COMMUNITY): Payer: Self-pay

## 2024-06-28 DIAGNOSIS — E860 Dehydration: Secondary | ICD-10-CM | POA: Diagnosis present

## 2024-06-28 DIAGNOSIS — N1832 Chronic kidney disease, stage 3b: Secondary | ICD-10-CM | POA: Diagnosis present

## 2024-06-28 DIAGNOSIS — M6281 Muscle weakness (generalized): Secondary | ICD-10-CM | POA: Diagnosis not present

## 2024-06-28 DIAGNOSIS — Z885 Allergy status to narcotic agent status: Secondary | ICD-10-CM

## 2024-06-28 DIAGNOSIS — E872 Acidosis, unspecified: Secondary | ICD-10-CM | POA: Diagnosis present

## 2024-06-28 DIAGNOSIS — G9340 Encephalopathy, unspecified: Secondary | ICD-10-CM | POA: Diagnosis not present

## 2024-06-28 DIAGNOSIS — E875 Hyperkalemia: Secondary | ICD-10-CM | POA: Diagnosis present

## 2024-06-28 DIAGNOSIS — F02B0 Dementia in other diseases classified elsewhere, moderate, without behavioral disturbance, psychotic disturbance, mood disturbance, and anxiety: Secondary | ICD-10-CM | POA: Diagnosis not present

## 2024-06-28 DIAGNOSIS — Z66 Do not resuscitate: Secondary | ICD-10-CM | POA: Diagnosis present

## 2024-06-28 DIAGNOSIS — Z9049 Acquired absence of other specified parts of digestive tract: Secondary | ICD-10-CM

## 2024-06-28 DIAGNOSIS — D464 Refractory anemia, unspecified: Secondary | ICD-10-CM | POA: Diagnosis not present

## 2024-06-28 DIAGNOSIS — R627 Adult failure to thrive: Secondary | ICD-10-CM | POA: Diagnosis present

## 2024-06-28 DIAGNOSIS — R2681 Unsteadiness on feet: Secondary | ICD-10-CM | POA: Diagnosis not present

## 2024-06-28 DIAGNOSIS — I728 Aneurysm of other specified arteries: Secondary | ICD-10-CM | POA: Diagnosis not present

## 2024-06-28 DIAGNOSIS — R404 Transient alteration of awareness: Secondary | ICD-10-CM | POA: Diagnosis not present

## 2024-06-28 DIAGNOSIS — I959 Hypotension, unspecified: Secondary | ICD-10-CM | POA: Diagnosis not present

## 2024-06-28 DIAGNOSIS — R4182 Altered mental status, unspecified: Secondary | ICD-10-CM

## 2024-06-28 DIAGNOSIS — R636 Underweight: Secondary | ICD-10-CM | POA: Diagnosis present

## 2024-06-28 DIAGNOSIS — M4984 Spondylopathy in diseases classified elsewhere, thoracic region: Secondary | ICD-10-CM | POA: Diagnosis not present

## 2024-06-28 DIAGNOSIS — N179 Acute kidney failure, unspecified: Principal | ICD-10-CM | POA: Diagnosis present

## 2024-06-28 DIAGNOSIS — R1311 Dysphagia, oral phase: Secondary | ICD-10-CM | POA: Diagnosis not present

## 2024-06-28 DIAGNOSIS — E039 Hypothyroidism, unspecified: Secondary | ICD-10-CM | POA: Diagnosis present

## 2024-06-28 DIAGNOSIS — Z981 Arthrodesis status: Secondary | ICD-10-CM | POA: Diagnosis not present

## 2024-06-28 DIAGNOSIS — E739 Lactose intolerance, unspecified: Secondary | ICD-10-CM | POA: Diagnosis present

## 2024-06-28 DIAGNOSIS — Z9181 History of falling: Secondary | ICD-10-CM | POA: Diagnosis not present

## 2024-06-28 DIAGNOSIS — K5792 Diverticulitis of intestine, part unspecified, without perforation or abscess without bleeding: Secondary | ICD-10-CM | POA: Diagnosis not present

## 2024-06-28 DIAGNOSIS — Z87820 Personal history of traumatic brain injury: Secondary | ICD-10-CM

## 2024-06-28 DIAGNOSIS — D649 Anemia, unspecified: Secondary | ICD-10-CM

## 2024-06-28 DIAGNOSIS — R471 Dysarthria and anarthria: Secondary | ICD-10-CM | POA: Diagnosis not present

## 2024-06-28 DIAGNOSIS — R5381 Other malaise: Secondary | ICD-10-CM | POA: Diagnosis not present

## 2024-06-28 DIAGNOSIS — Z90710 Acquired absence of both cervix and uterus: Secondary | ICD-10-CM

## 2024-06-28 DIAGNOSIS — G3183 Dementia with Lewy bodies: Secondary | ICD-10-CM | POA: Diagnosis present

## 2024-06-28 DIAGNOSIS — I700 Atherosclerosis of aorta: Secondary | ICD-10-CM | POA: Diagnosis present

## 2024-06-28 DIAGNOSIS — Z87891 Personal history of nicotine dependence: Secondary | ICD-10-CM

## 2024-06-28 DIAGNOSIS — I129 Hypertensive chronic kidney disease with stage 1 through stage 4 chronic kidney disease, or unspecified chronic kidney disease: Secondary | ICD-10-CM | POA: Diagnosis present

## 2024-06-28 DIAGNOSIS — I6782 Cerebral ischemia: Secondary | ICD-10-CM | POA: Diagnosis not present

## 2024-06-28 DIAGNOSIS — Z79890 Hormone replacement therapy (postmenopausal): Secondary | ICD-10-CM | POA: Diagnosis not present

## 2024-06-28 DIAGNOSIS — K5732 Diverticulitis of large intestine without perforation or abscess without bleeding: Secondary | ICD-10-CM | POA: Diagnosis present

## 2024-06-28 DIAGNOSIS — R5382 Chronic fatigue, unspecified: Secondary | ICD-10-CM | POA: Diagnosis present

## 2024-06-28 DIAGNOSIS — R748 Abnormal levels of other serum enzymes: Secondary | ICD-10-CM | POA: Diagnosis present

## 2024-06-28 DIAGNOSIS — E1122 Type 2 diabetes mellitus with diabetic chronic kidney disease: Secondary | ICD-10-CM | POA: Diagnosis not present

## 2024-06-28 DIAGNOSIS — L89151 Pressure ulcer of sacral region, stage 1: Secondary | ICD-10-CM | POA: Diagnosis present

## 2024-06-28 DIAGNOSIS — Z8249 Family history of ischemic heart disease and other diseases of the circulatory system: Secondary | ICD-10-CM | POA: Diagnosis not present

## 2024-06-28 DIAGNOSIS — Z803 Family history of malignant neoplasm of breast: Secondary | ICD-10-CM

## 2024-06-28 DIAGNOSIS — G9341 Metabolic encephalopathy: Secondary | ICD-10-CM | POA: Diagnosis present

## 2024-06-28 DIAGNOSIS — Z741 Need for assistance with personal care: Secondary | ICD-10-CM | POA: Diagnosis not present

## 2024-06-28 DIAGNOSIS — D539 Nutritional anemia, unspecified: Secondary | ICD-10-CM | POA: Diagnosis present

## 2024-06-28 DIAGNOSIS — F0280 Dementia in other diseases classified elsewhere without behavioral disturbance: Secondary | ICD-10-CM | POA: Diagnosis present

## 2024-06-28 DIAGNOSIS — Z8051 Family history of malignant neoplasm of kidney: Secondary | ICD-10-CM

## 2024-06-28 DIAGNOSIS — M4986 Spondylopathy in diseases classified elsewhere, lumbar region: Secondary | ICD-10-CM | POA: Diagnosis not present

## 2024-06-28 DIAGNOSIS — B9620 Unspecified Escherichia coli [E. coli] as the cause of diseases classified elsewhere: Secondary | ICD-10-CM | POA: Diagnosis present

## 2024-06-28 DIAGNOSIS — R1310 Dysphagia, unspecified: Secondary | ICD-10-CM | POA: Diagnosis not present

## 2024-06-28 DIAGNOSIS — M6259 Muscle wasting and atrophy, not elsewhere classified, multiple sites: Secondary | ICD-10-CM | POA: Diagnosis not present

## 2024-06-28 DIAGNOSIS — Z681 Body mass index (BMI) 19 or less, adult: Secondary | ICD-10-CM

## 2024-06-28 DIAGNOSIS — R41841 Cognitive communication deficit: Secondary | ICD-10-CM | POA: Diagnosis not present

## 2024-06-28 DIAGNOSIS — R935 Abnormal findings on diagnostic imaging of other abdominal regions, including retroperitoneum: Secondary | ICD-10-CM | POA: Diagnosis not present

## 2024-06-28 DIAGNOSIS — R2689 Other abnormalities of gait and mobility: Secondary | ICD-10-CM | POA: Diagnosis not present

## 2024-06-28 DIAGNOSIS — I10 Essential (primary) hypertension: Secondary | ICD-10-CM | POA: Diagnosis not present

## 2024-06-28 DIAGNOSIS — N390 Urinary tract infection, site not specified: Secondary | ICD-10-CM | POA: Diagnosis present

## 2024-06-28 DIAGNOSIS — D631 Anemia in chronic kidney disease: Secondary | ICD-10-CM | POA: Diagnosis not present

## 2024-06-28 DIAGNOSIS — Z87440 Personal history of urinary (tract) infections: Secondary | ICD-10-CM

## 2024-06-28 DIAGNOSIS — R4781 Slurred speech: Secondary | ICD-10-CM | POA: Diagnosis present

## 2024-06-28 DIAGNOSIS — Z79899 Other long term (current) drug therapy: Secondary | ICD-10-CM

## 2024-06-28 DIAGNOSIS — Z807 Family history of other malignant neoplasms of lymphoid, hematopoietic and related tissues: Secondary | ICD-10-CM

## 2024-06-28 DIAGNOSIS — R634 Abnormal weight loss: Secondary | ICD-10-CM | POA: Diagnosis not present

## 2024-06-28 DIAGNOSIS — R29818 Other symptoms and signs involving the nervous system: Secondary | ICD-10-CM | POA: Diagnosis not present

## 2024-06-28 DIAGNOSIS — Z7401 Bed confinement status: Secondary | ICD-10-CM | POA: Diagnosis not present

## 2024-06-28 LAB — URINALYSIS, W/ REFLEX TO CULTURE (INFECTION SUSPECTED)
Bilirubin Urine: NEGATIVE
Glucose, UA: NEGATIVE mg/dL
Hgb urine dipstick: NEGATIVE
Ketones, ur: NEGATIVE mg/dL
Nitrite: NEGATIVE
Protein, ur: NEGATIVE mg/dL
Specific Gravity, Urine: 1.012 (ref 1.005–1.030)
pH: 5 (ref 5.0–8.0)

## 2024-06-28 LAB — COMPREHENSIVE METABOLIC PANEL WITH GFR
ALT: 14 U/L (ref 0–44)
AST: 15 U/L (ref 15–41)
Albumin: 3.6 g/dL (ref 3.5–5.0)
Alkaline Phosphatase: 87 U/L (ref 38–126)
Anion gap: 13 (ref 5–15)
BUN: 99 mg/dL — ABNORMAL HIGH (ref 8–23)
CO2: 16 mmol/L — ABNORMAL LOW (ref 22–32)
Calcium: 9.2 mg/dL (ref 8.9–10.3)
Chloride: 111 mmol/L (ref 98–111)
Creatinine, Ser: 4.03 mg/dL — ABNORMAL HIGH (ref 0.44–1.00)
GFR, Estimated: 10 mL/min — ABNORMAL LOW (ref >60)
Glucose, Bld: 98 mg/dL (ref 70–99)
Potassium: 5.5 mmol/L — ABNORMAL HIGH (ref 3.5–5.1)
Sodium: 139 mmol/L (ref 135–145)
Total Bilirubin: 0.3 mg/dL (ref 0.0–1.2)
Total Protein: 6.7 g/dL (ref 6.5–8.1)

## 2024-06-28 LAB — CBC WITH DIFFERENTIAL/PLATELET
Abs Immature Granulocytes: 0.03 K/uL (ref 0.00–0.07)
Basophils Absolute: 0.1 K/uL (ref 0.0–0.1)
Basophils Relative: 1 %
Eosinophils Absolute: 0.2 K/uL (ref 0.0–0.5)
Eosinophils Relative: 2 %
HCT: 25.2 % — ABNORMAL LOW (ref 36.0–46.0)
Hemoglobin: 8 g/dL — ABNORMAL LOW (ref 12.0–15.0)
Immature Granulocytes: 0 %
Lymphocytes Relative: 18 %
Lymphs Abs: 1.5 K/uL (ref 0.7–4.0)
MCH: 32.9 pg (ref 26.0–34.0)
MCHC: 31.7 g/dL (ref 30.0–36.0)
MCV: 103.7 fL — ABNORMAL HIGH (ref 80.0–100.0)
Monocytes Absolute: 0.7 K/uL (ref 0.1–1.0)
Monocytes Relative: 8 %
Neutro Abs: 5.9 K/uL (ref 1.7–7.7)
Neutrophils Relative %: 71 %
Platelets: 246 K/uL (ref 150–400)
RBC: 2.43 MIL/uL — ABNORMAL LOW (ref 3.87–5.11)
RDW: 13.5 % (ref 11.5–15.5)
WBC: 8.4 K/uL (ref 4.0–10.5)
nRBC: 0 % (ref 0.0–0.2)

## 2024-06-28 LAB — TROPONIN T, HIGH SENSITIVITY
Troponin T High Sensitivity: 29 ng/L — ABNORMAL HIGH (ref 0–19)
Troponin T High Sensitivity: 26 ng/L — ABNORMAL HIGH (ref 0–19)

## 2024-06-28 LAB — PRO BRAIN NATRIURETIC PEPTIDE: Pro Brain Natriuretic Peptide: 454 pg/mL — ABNORMAL HIGH (ref <300.0)

## 2024-06-28 MED ORDER — SODIUM CHLORIDE 0.9 % IV SOLN
1.0000 g | Freq: Once | INTRAVENOUS | Status: AC
  Administered 2024-06-28: 1 g via INTRAVENOUS
  Filled 2024-06-28: qty 10

## 2024-06-28 MED ORDER — METRONIDAZOLE 500 MG/100ML IV SOLN
500.0000 mg | Freq: Once | INTRAVENOUS | Status: AC
  Administered 2024-06-28: 500 mg via INTRAVENOUS
  Filled 2024-06-28: qty 100

## 2024-06-28 MED ORDER — SODIUM CHLORIDE 0.9 % IV BOLUS
1000.0000 mL | Freq: Once | INTRAVENOUS | Status: AC
  Administered 2024-06-28: 1000 mL via INTRAVENOUS

## 2024-06-28 NOTE — ED Provider Notes (Addendum)
 [place] EMERGENCY DEPARTMENT AT [facility] Provider Note   CSN: [id number] Arrival date & time: 06/28/24  1710     Patient presents with: Failure To Thrive, Altered Mental Status, and Abnormal Labs   [name] is a 89 y.o. female history of dementia, hypertension, recurrent UTI, here presenting with altered mental status.  Patient is from [facility].  Patient was noted to be less verbal for the last week or so per the son.  Son has been contacting facility and apparently had a urinalysis done a week ago that was normal.  However patient continues to decline.  Patient apparently had labs drawn and BNP was elevated.  However there is no reported shortness of breath.  Patient has overall poor p.o. intake.  I was unable to see the labs from the facility.  Patient unable to give me any history due to altered mental status and dementia.   The history is provided by the EMS personnel and a relative.       Prior to Admission medications   Medication Sig Start Date End Date Taking? Authorizing Provider  amLODipine  (NORVASC ) 10 MG tablet TAKE 1 TABLET BY MOUTH DAILY Patient taking differently: Take 5 mg by mouth daily. 06/23/22   Nafziger, Darleene, NP  Brexpiprazole  (REXULTI ) 0.5 MG TABS TAKE 1 TABLET BY MOUTH AT NIGHT Patient taking differently: Take 0.5 mg by mouth at bedtime. 05/11/23   Georjean Darice HERO, MD  docusate sodium  (COLACE) 100 MG capsule Take 1 capsule (100 mg total) by mouth 2 (two) times daily. Patient not taking: Reported on 01/13/2024 12/11/22   Hongalgi, Anand D, MD  donepezil  (ARICEPT ) 10 MG tablet Take 1 tablet (10 mg total) by mouth at bedtime. Patient not taking: Reported on 01/13/2024 12/11/22   Hongalgi, Anand D, MD  furosemide  (LASIX ) 20 MG tablet Take 20 mg by mouth 2 (two) times daily.    [provider]  hydrALAZINE  (APRESOLINE ) 25 MG tablet Take 1 tablet (25 mg total) by mouth every 8 (eight) hours. Patient not taking: Reported on 01/13/2024  05/25/23 01/13/24  Leotis Bogus, MD  hydrALAZINE  (APRESOLINE ) 50 MG tablet Take 50 mg by mouth See admin instructions. Take 50 mg by mouth four times a day and hold for a Systolic reading less than 120    [provider]  isosorbide  mononitrate (ISMO ) 10 MG tablet Take 10 mg by mouth 2 (two) times daily.    [provider]  levothyroxine  (SYNTHROID ) 175 MCG tablet Take 175 mcg by mouth daily before breakfast.    [provider]  memantine  (NAMENDA ) 10 MG tablet TAKE 1 TABLET BY MOUTH IN THE  MORNING AND AT BEDTIME Patient taking differently: Take 10 mg by mouth 2 (two) times daily. 12/07/22   Georjean Darice HERO, MD  Multiple Vitamin (MULTIVITAMIN) tablet Take 1 tablet by mouth See admin instructions. Take 1 tablet by mouth once a day with food    [provider]  Multiple Vitamins-Minerals (PRESERVISION AREDS) TABS Take 1 tablet by mouth daily.    [provider]  olmesartan  (BENICAR ) 40 MG tablet Take 40 mg by mouth See admin instructions. Take 40 mg by mouth once a day and hold for a Systolic reading less than 110    [provider]  omeprazole  (PRILOSEC) 40 MG capsule TAKE 1 CAPSULE BY MOUTH DAILY Patient not taking: Reported on 01/13/2024 06/23/22   Nafziger, Cory, NP  pantoprazole  (PROTONIX ) 40 MG tablet Take 40 mg by mouth 2 (  two) times daily. 05/16/23   [provider]  polyethylene glycol (MIRALAX  / GLYCOLAX ) 17 g packet Take 17 g by mouth daily as needed for mild constipation. Patient taking differently: Take 17 g by mouth daily as needed for mild constipation (mix into 6 ounces of fluid). 12/11/22   Hongalgi, Anand D, MD  potassium chloride  SA (KLOR-CON  M) 20 MEQ tablet Take 2 tablets (40 mEq total) by mouth 2 (two) times daily for 3 days. 01/13/24 01/16/24  Curatolo, Adam, DO  QUEtiapine  (SEROQUEL ) 50 MG tablet TAKE 1 TABLET BY MOUTH TWICE  DAILY Patient taking differently: Take 50-75 mg by mouth See admin instructions. Take 50 mg by mouth  once a day and 75 mg at bedtime 02/01/23   Georjean Darice HERO, MD  sertraline  (ZOLOFT ) 100 MG tablet Take 1 tablet (100 mg total) by mouth in the morning and at bedtime. 12/11/22   Hongalgi, Anand D, MD  TYLENOL  500 MG tablet Take 1,000 mg by mouth in the morning and at bedtime.    [provider]    Allergies: Lactose intolerance (gi) and Oxycodone hcl    Review of Systems  Unable to perform ROS: Mental status change  Psychiatric/Behavioral:  Positive for confusion.   All other systems reviewed and are negative.   Updated Vital Signs BP (!) 113/50   Pulse 71   Temp 98 F (36.7 C)   Resp 16   SpO2 94%   Physical Exam Vitals and nursing note reviewed.  Constitutional:      Comments: Demented and confused  HENT:     Head: Normocephalic.     Nose: Nose normal.     Mouth/Throat:     Mouth: Mucous membranes are dry.  Eyes:     Extraocular Movements: Extraocular movements intact.     Pupils: Pupils are equal, round, and reactive to light.  Cardiovascular:     Rate and Rhythm: Normal rate and regular rhythm.     Pulses: Normal pulses.     Heart sounds: Normal heart sounds.  Pulmonary:     Comments: Diminished bilaterally but no wheezing or crackles Abdominal:     General: Abdomen is flat.     Palpations: Abdomen is soft.  Musculoskeletal:        General: Normal range of motion.     Cervical back: Normal range of motion and neck supple.     Comments: Patient has stage I sacral decub ulcer  Skin:    General: Skin is warm.     Capillary Refill: Capillary refill takes less than 2 seconds.  Neurological:     Comments: Demented     (all labs ordered are listed, but only abnormal results are displayed) Labs Reviewed  URINE CULTURE  CBC WITH DIFFERENTIAL/PLATELET  COMPREHENSIVE METABOLIC PANEL WITH GFR  PRO BRAIN NATRIURETIC PEPTIDE  URINALYSIS, W/ REFLEX TO CULTURE (INFECTION SUSPECTED)  TROPONIN T, HIGH SENSITIVITY    EKG: EKG  Interpretation Date/Time:  Wednesday June 28 2024 17:37:23 EST Ventricular Rate:  74 PR Interval:  205 QRS Duration:  106 QT Interval:  388 QTC Calculation: 431 R Axis:   17  Text Interpretation: Sinus rhythm No significant change since last tracing Confirmed by Patt Alm DEL [phone]) on 06/28/2024 6:43:27 PM  Radiology: No results found.   Procedures   Medications Ordered in the ED - No data to display  Medical Decision Making [name] is a 89 y.o. female here presenting with altered mental status.  Consider head bleed versus stroke versus UTI versus pneumonia versus heart failure exacerbation.  Plan to get CBC and CMP and UA and chest x-ray and CT head.  Patient will likely need admission.  10:03 PM Reviewed patient's labs and BNP is only 450.  Patient appears dehydrated clinically.  Patient has acute renal failure with creatinine of 4.  Patient's BUN is 99 indicating dehydration.  UA showed positive UTI and I ordered Rocephin  initially.  Her CT noncontrast does show diverticulitis with no obvious abscess.  I added on IV Flagyl.  Patient will be admitted for diverticulitis, or acute renal failure likely from dehydration.  11:24 PM I confirmed CODE STATUS with patient's family.  Patient has paperwork that says she is DNR but wants IV fluids and antibiotics and no pressors.  I confirmed that with family who is at bedside  Problems Addressed: AKI (acute kidney injury): acute illness or injury Dehydration: acute illness or injury Diverticulitis: acute illness or injury  Amount and/or Complexity of Data Reviewed Labs: ordered. Decision-making details documented in ED Course. Radiology: ordered and independent interpretation performed. Decision-making details documented in ED Course. ECG/medicine tests: ordered and independent interpretation performed. Decision-making details documented in ED Course.  Risk Prescription drug  management. Decision regarding hospitalization.     Final diagnoses:  None    ED Discharge Orders     None          Patt Alm Macho, MD 06/28/24 2205    Patt Alm Macho, MD 06/28/24 2325

## 2024-06-28 NOTE — ED Triage Notes (Signed)
 Pt BIB EMS from [facility] due to Failure to Thrive and AMS. Facility reports labs showing elevated BNP and sudden decline over the past few days mental, and decreased activity. Pt AAOx1; pt responses to verbal commands; baseline unclear. Hx of Dementia.  BP 108/42 HR 70 SpO2 97% RA CBG 105 RR 16 Temp 98.2

## 2024-06-28 NOTE — H&P (Signed)
 History and Physical    [name] FMW:[id number] DOB: [birth year] DOA: 06/28/2024  PCP: Marston Junette HERO, MD  Patient coming from: facility   I have personally briefly reviewed patient's old medical records in [facility]  Chief Complaint: decrease po, change in mental status   HPI: [name] is a 89 y.o. female with medical history significant of  hx of subarachnoid hemorrhage  s/p fall, Lewy body dementia, hypothyroidism, hypertension, CKDIIIa, anemia , chronic fatigue who presents to ED from facility due to poor po in take and change in mental status over the last few days. Patient is unable to give history is alert to self and place.    ED Course:   On evaluation in ED , patient has stable vitals, CTH unremarkable,  However patient was found to have Aki with cr of 4 up from baseline 1.6, as well as UTI and acute diverticulitis on CT.   Afeb b, 113/50, hr 71, rr 16, sat 945  EKG:nsr  74 Wbc 8.4, hgb 8 (9.9), plt 246 Na 139, K 5.5, Cl 111, bicarb 16, cr 4.03,  CE 29, 26 Pro bnp 454 UA : wbc 11-20, bacteria many   CE26 CTH IMPRESSION: 1. No acute intracranial abnormality. 2. Diffuse cerebral parenchymal volume loss. 3. Moderate chronic ischemic microvascular disease.   CTAB IMPRESSION: 1. Acute uncomplicated diverticulitis of the sigmoid colon and mid descending colon. 2. Aortic atherosclerosis.  TX, CTX/Metronidazole   Review of Systems: As per HPI otherwise 10 point review of systems negative.   Past Medical History:  Diagnosis Date   Anemia    Chronic fatigue    Chronic renal insufficiency, stage III (moderate)    Depression    HYPERTENSION 04/04/2007   HYPOTHYROIDISM 04/04/2007   Lewy body dementia (HCC)    Macular degeneration     Past Surgical History:  Procedure Laterality Date   ABDOMINAL HYSTERECTOMY     APPENDECTOMY     BACK SURGERY  2012   Cataracts     CHOLECYSTECTOMY     ERCP N/A 11/22/2014   Procedure: ENDOSCOPIC RETROGRADE  CHOLANGIOPANCREATOGRAPHY (ERCP);  Surgeon: Oliva Boots, MD;  Location: [facility] ENDOSCOPY;  Service: Endoscopy;  Laterality: N/A;   HERNIA REPAIR     OOPHORECTOMY     ORIF TIBIA FRACTURE  2012   dr harden   [facility] SURGERY  2011   cervical     reports that she quit smoking about 54 years ago. Her smoking use included cigarettes. She started smoking about 61 years ago. She has a 7 pack-year smoking history. She has never used smokeless tobacco. She reports that she does not drink alcohol  and does not use drugs.  Allergies  Allergen Reactions   Lactose Intolerance (Gi) Other (See Comments)    Reaction not stated on MAR   Oxycodone Hcl Itching    Family History  Problem Relation Age of Onset   Kidney cancer Mother 12       renal cell CA   Heart disease Father    Lymphoma Son    Breast cancer Daughter     Prior to Admission medications   Medication Sig Start Date End Date Taking? Authorizing Provider  amLODipine  (NORVASC ) 10 MG tablet TAKE 1 TABLET BY MOUTH DAILY Patient taking differently: Take 5 mg by mouth daily. 06/23/22   Nafziger, Darleene, NP  Brexpiprazole  (REXULTI ) 0.5 MG TABS TAKE 1 TABLET BY MOUTH AT NIGHT Patient taking differently: Take 0.5 mg by mouth at bedtime. 05/11/23  Georjean Darice HERO, MD  docusate sodium  (COLACE) 100 MG capsule Take 1 capsule (100 mg total) by mouth 2 (two) times daily. Patient not taking: Reported on 01/13/2024 12/11/22   Hongalgi, Anand D, MD  donepezil  (ARICEPT ) 10 MG tablet Take 1 tablet (10 mg total) by mouth at bedtime. Patient not taking: Reported on 01/13/2024 12/11/22   Hongalgi, Anand D, MD  furosemide  (LASIX ) 20 MG tablet Take 20 mg by mouth 2 (two) times daily.    [provider]  hydrALAZINE  (APRESOLINE ) 25 MG tablet Take 1 tablet (25 mg total) by mouth every 8 (eight) hours. Patient not taking: Reported on 01/13/2024 05/25/23 01/13/24  Leotis Bogus, MD  hydrALAZINE  (APRESOLINE ) 50 MG tablet Take 50 mg by mouth See admin instructions. Take 50 mg  by mouth four times a day and hold for a Systolic reading less than 120    [provider]  isosorbide mononitrate (ISMO) 10 MG tablet Take 10 mg by mouth 2 (two) times daily.    [provider]  levothyroxine  (SYNTHROID ) 175 MCG tablet Take 175 mcg by mouth daily before breakfast.    [provider]  memantine  (NAMENDA ) 10 MG tablet TAKE 1 TABLET BY MOUTH IN THE  MORNING AND AT BEDTIME Patient taking differently: Take 10 mg by mouth 2 (two) times daily. 12/07/22   Georjean Darice HERO, MD  Multiple Vitamin (MULTIVITAMIN) tablet Take 1 tablet by mouth See admin instructions. Take 1 tablet by mouth once a day with food    [provider]  Multiple Vitamins-Minerals (PRESERVISION AREDS) TABS Take 1 tablet by mouth daily.    [provider]  olmesartan  (BENICAR ) 40 MG tablet Take 40 mg by mouth See admin instructions. Take 40 mg by mouth once a day and hold for a Systolic reading less than 110    [provider]  omeprazole  (PRILOSEC) 40 MG capsule TAKE 1 CAPSULE BY MOUTH DAILY Patient not taking: Reported on 01/13/2024 06/23/22   Nafziger, Cory, NP  pantoprazole  (PROTONIX ) 40 MG tablet Take 40 mg by mouth 2 (two) times daily. 05/16/23   [provider]  polyethylene glycol (MIRALAX  / GLYCOLAX ) 17 g packet Take 17 g by mouth daily as needed for mild constipation. Patient taking differently: Take 17 g by mouth daily as needed for mild constipation (mix into 6 ounces of fluid). 12/11/22   Hongalgi, Anand D, MD  potassium chloride  SA (KLOR-CON  M) 20 MEQ tablet Take 2 tablets (40 mEq total) by mouth 2 (two) times daily for 3 days. 01/13/24 01/16/24  Curatolo, Adam, DO  QUEtiapine  (SEROQUEL ) 50 MG tablet TAKE 1 TABLET BY MOUTH TWICE  DAILY Patient taking differently: Take 50-75 mg by mouth See admin instructions. Take 50 mg by mouth once a day and 75 mg at bedtime 02/01/23   Georjean Darice HERO, MD  sertraline  (ZOLOFT ) 100 MG tablet Take 1 tablet (100 mg total) by  mouth in the morning and at bedtime. 12/11/22   Hongalgi, Anand D, MD  TYLENOL  500 MG tablet Take 1,000 mg by mouth in the morning and at bedtime.    [provider]    Physical Exam: Vitals:   06/28/24 1724 06/28/24 2120 06/28/24 2152  BP: (!) 113/50 (!) 118/59   Pulse: 71 71   Resp: 16 17   Temp: 98 F (36.7 C)  98 F (36.7 C)  TempSrc:   Oral  SpO2: 94% 94%     Constitutional: NAD, calm, comfortable Vitals:   06/28/24 1724 06/28/24  2120 06/28/24 2152  BP: (!) 113/50 (!) 118/59   Pulse: 71 71   Resp: 16 17   Temp: 98 F (36.7 C)  98 F (36.7 C)  TempSrc:   Oral  SpO2: 94% 94%    Eyes: PERRL, lids and conjunctivae normal ENMT: Mucous membranes are dry.  Neck: normal, supple, no masses, no thyromegaly Respiratory: clear to auscultation bilaterally, no wheezing, no crackles. Normal respiratory effort. No accessory muscle use.  Cardiovascular: Regular rate and rhythm, no murmurs / rubs / gallops. No extremity edema. 2+ pedal pulses.  Abdomen: + suprapubic tenderness, no masses palpated. No hepatosplenomegaly. Bowel sounds positive.  Musculoskeletal: no clubbing / cyanosis. No joint deformity upper and lower extremities. Good ROM, no contractures. Normal muscle tone.  Skin: no rashes, lesions, ulcers. No induration Neurologic: CN 2-12 grossly intact. Sensation intact, . Strength 5/5 in all 4.  Psychiatric: Normal judgment and insight. Alert and oriented x 2 Normal mood.    Labs on Admission: I have personally reviewed following labs and imaging studies  CBC: Recent Labs  Lab 06/28/24 1850  WBC 8.4  NEUTROABS 5.9  HGB 8.0*  HCT 25.2*  MCV 103.7*  PLT 246   Basic Metabolic Panel: Recent Labs  Lab 06/28/24 1850  NA 139  K 5.5*  CL 111  CO2 16*  GLUCOSE 98  BUN 99*  CREATININE 4.03*  CALCIUM  9.2   GFR: CrCl cannot be calculated (Unknown ideal weight.). Liver Function Tests: Recent Labs  Lab 06/28/24 1850  AST 15  ALT 14  ALKPHOS 87  BILITOT  0.3  PROT 6.7  ALBUMIN 3.6   No results for input(s): LIPASE, AMYLASE in the last 168 hours. No results for input(s): AMMONIA in the last 168 hours. Coagulation Profile: No results for input(s): INR, PROTIME in the last 168 hours. Cardiac Enzymes: No results for input(s): CKTOTAL, CKMB, CKMBINDEX, TROPONINI in the last 168 hours. BNP (last 3 results) Recent Labs    06/28/24 1850  PROBNP 454.0*   HbA1C: No results for input(s): HGBA1C in the last 72 hours. CBG: No results for input(s): GLUCAP in the last 168 hours. Lipid Profile: No results for input(s): CHOL, HDL, LDLCALC, TRIG, CHOLHDL, LDLDIRECT in the last 72 hours. Thyroid  Function Tests: No results for input(s): TSH, T4TOTAL, FREET4, T3FREE, THYROIDAB in the last 72 hours. Anemia Panel: No results for input(s): VITAMINB12, FOLATE, FERRITIN, TIBC, IRON, RETICCTPCT in the last 72 hours. Urine analysis:    Component Value Date/Time   COLORURINE YELLOW 06/28/2024 1951   APPEARANCEUR HAZY (A) 06/28/2024 1951   LABSPEC 1.012 06/28/2024 1951   PHURINE 5.0 06/28/2024 1951   GLUCOSEU NEGATIVE 06/28/2024 1951   GLUCOSEU NEGATIVE 07/08/2021 1146   HGBUR NEGATIVE 06/28/2024 1951   BILIRUBINUR NEGATIVE 06/28/2024 1951   BILIRUBINUR neg 09/22/2022 1648   KETONESUR NEGATIVE 06/28/2024 1951   PROTEINUR NEGATIVE 06/28/2024 1951   UROBILINOGEN 0.2 09/22/2022 1648   UROBILINOGEN 0.2 07/08/2021 1146   NITRITE NEGATIVE 06/28/2024 1951   LEUKOCYTESUR SMALL (A) 06/28/2024 1951    Radiological Exams on Admission: CT Renal Stone Study Result Date: 06/28/2024 CLINICAL DATA:  Abdominal and flank pain. EXAM: CT ABDOMEN AND PELVIS WITHOUT CONTRAST TECHNIQUE: Multidetector CT imaging of the abdomen and pelvis was performed following the standard protocol without IV contrast. RADIATION DOSE REDUCTION: This exam was performed according to the departmental dose-optimization program which  includes automated exposure control, adjustment of the mA and/or kV according to patient size and/or use of iterative reconstruction technique. COMPARISON:  CT chest abdomen and pelvis 12/09/2022. FINDINGS: Lower chest: No acute abnormality. Hepatobiliary: No focal liver abnormality is seen. Status post cholecystectomy. No biliary dilatation. There are calcified granulomas in the liver. Pancreas: Unremarkable. No pancreatic ductal dilatation or surrounding inflammatory changes. Spleen: Normal in size without focal abnormality. There are calcified granulomas in the spleen. Adrenals/Urinary Tract: Adrenal glands are unremarkable. Kidneys are normal, without renal calculi, focal lesion, or hydronephrosis. Bladder is unremarkable. Stomach/Bowel: There is diffuse colonic diverticulosis. There is wall thickening and mild inflammation of the sigmoid colon and mid descending colon worrisome for acute diverticulitis. There is no evidence for perforation or abscess. There is no bowel obstruction. The appendix is not seen. Stomach and small bowel loops are within normal limits. Vascular/Lymphatic: There are atherosclerotic calcifications of the aorta, branch vessels and iliac arteries. There is a calcified splenic artery aneurysm near the hilum of the spleen measuring 11 mm which is unchanged from 2024. No enlarged lymph nodes are identified. Reproductive: Status post hysterectomy. No adnexal masses. Other: No abdominal wall hernia or abnormality. No abdominopelvic ascites. Musculoskeletal: The bones are diffusely osteopenic. Degenerative changes affect the spine. L3 vertebral body hemangioma present. IMPRESSION: 1. Acute uncomplicated diverticulitis of the sigmoid colon and mid descending colon. 2. Aortic atherosclerosis. Aortic Atherosclerosis (ICD10-I70.0). Electronically Signed   By: Greig Pique M.D.   On: 06/28/2024 21:42   CT HEAD WO CONTRAST ( ) Result Date: 06/28/2024 EXAM: CT HEAD WITHOUT CONTRAST 06/28/2024  09:16:00 PM TECHNIQUE: CT of the head was performed without the administration of intravenous contrast. Automated exposure control, iterative reconstruction, and/or weight based adjustment of the mA/kV was utilized to reduce the radiation dose to as low as reasonably achievable. COMPARISON: 01/13/2024 CLINICAL HISTORY: Neuro deficit, acute, stroke suspected. FINDINGS: BRAIN AND VENTRICLES: No acute hemorrhage. No evidence of acute infarct. Proportional prominence of ventricles and sulci, consistent with diffuse cerebral parenchymal volume loss. Periventricular and subcortical white matter hypoattenuation, consistent with moderate chronic ischemic microvascular disease. Calcified atherosclerotic plaque in cavernous/supraclinoid internal carotid arteries. No hydrocephalus. No extra-axial collection. No mass effect or midline shift. ORBITS: Bilateral lens replacement. SINUSES: No acute abnormality. SOFT TISSUES AND SKULL: No acute soft tissue abnormality. No skull fracture. IMPRESSION: 1. No acute intracranial abnormality. 2. Diffuse cerebral parenchymal volume loss. 3. Moderate chronic ischemic microvascular disease. Electronically signed by: Donnice Mania MD 06/28/2024 09:41 PM EST RP Workstation: [id number]   DG Chest Port 1 View Result Date: 06/28/2024 EXAM: 1 VIEW(S) XRAY OF THE CHEST 06/28/2024 06:50:00 PM COMPARISON: 01/13/2024 CLINICAL HISTORY: AMS FINDINGS: LINES, TUBES AND DEVICES: Right upper quadrant surgical clips noted. ACDF hardware in lower cervical spine. LUNGS AND PLEURA: Superior right upper lung benign calcified granuloma. No pleural effusion. No pneumothorax. HEART AND MEDIASTINUM: Moderate atherosclerotic calcifications in aortic arch. BONES AND SOFT TISSUES: Moderate multilevel degenerative disc changes of thoracic spine. No acute osseous abnormality. IMPRESSION: 1. No acute cardiopulmonary process. Electronically signed by: Morgane Naveau MD 06/28/2024 07:34 PM EST RP Workstation: [id number]     EKG: Independently reviewed. See above  Assessment/Plan   Acute diverticulitis  -admit to med tele  - continue on ctx/metronidazole  -clear liquids advance as tolerated  -supportive ivfs   UTI - on ctx  - f/u Urine culture  -tailor abx as needed   AKI  -insetting of poor po intake  -continue with ivfs   Acute metabolic encephalopathy  -due to aki/ and acute infection  -tx underlying cause -CTH NAD   Abnormal cardiac enzyme -noted to be flat  -elevated  in setting of AKI /demand for infection  - no further work needed at this time  -EKG no hyper acute findings   hx of subarachnoid hemorrhage  s/p fall -repeat CT notes resolution    Lewy body dementia -resume home regimen as able    Hypothyroidism -continue with synthroid     Hypertension -continue amlodipine  , hydralazine / imdur     Anemia -from to 8 form 9.9  -monitor h/h, check iron stores  -check fob as well        DVT prophylaxis: scd  Code Status: DNI/DNR Family Communication: none at bedside Disposition Plan: patient  expected to be admitted greater than 2 midnights  Consults called: n/a Admission status: med tele   Camila DELENA Ned MD [facility]   If 7PM-7AM, please contact night-coverage [email or web address] Password [facility]  06/28/2024, 10:42 PM

## 2024-06-28 NOTE — ED Notes (Signed)
 EKG done and given to Dr Patt

## 2024-06-29 ENCOUNTER — Encounter: Payer: Self-pay | Admitting: Oncology

## 2024-06-29 DIAGNOSIS — N179 Acute kidney failure, unspecified: Secondary | ICD-10-CM | POA: Diagnosis not present

## 2024-06-29 LAB — RETICULOCYTES
Immature Retic Fract: 4.9 % (ref 2.3–15.9)
RBC.: 2.25 MIL/uL — ABNORMAL LOW (ref 3.87–5.11)
Retic Count, Absolute: 26.3 K/uL (ref 19.0–186.0)
Retic Ct Pct: 1.2 % (ref 0.4–3.1)

## 2024-06-29 LAB — BASIC METABOLIC PANEL WITH GFR
Anion gap: 11 (ref 5–15)
BUN: 88 mg/dL — ABNORMAL HIGH (ref 8–23)
CO2: 15 mmol/L — ABNORMAL LOW (ref 22–32)
Calcium: 9 mg/dL (ref 8.9–10.3)
Chloride: 115 mmol/L — ABNORMAL HIGH (ref 98–111)
Creatinine, Ser: 3.18 mg/dL — ABNORMAL HIGH (ref 0.44–1.00)
GFR, Estimated: 13 mL/min — ABNORMAL LOW (ref >60)
Glucose, Bld: 88 mg/dL (ref 70–99)
Potassium: 5.1 mmol/L (ref 3.5–5.1)
Sodium: 141 mmol/L (ref 135–145)

## 2024-06-29 LAB — CBC
HCT: 24.2 % — ABNORMAL LOW (ref 36.0–46.0)
Hemoglobin: 7.6 g/dL — ABNORMAL LOW (ref 12.0–15.0)
MCH: 33 pg (ref 26.0–34.0)
MCHC: 31.4 g/dL (ref 30.0–36.0)
MCV: 105.2 fL — ABNORMAL HIGH (ref 80.0–100.0)
Platelets: 238 K/uL (ref 150–400)
RBC: 2.3 MIL/uL — ABNORMAL LOW (ref 3.87–5.11)
RDW: 13.5 % (ref 11.5–15.5)
WBC: 6.1 K/uL (ref 4.0–10.5)
nRBC: 0 % (ref 0.0–0.2)

## 2024-06-29 LAB — IRON AND TIBC
Iron: 25 ug/dL — ABNORMAL LOW (ref 28–170)
Saturation Ratios: 13 % (ref 10.4–31.8)
TIBC: 193 ug/dL — ABNORMAL LOW (ref 250–450)
UIBC: 168 ug/dL

## 2024-06-29 LAB — HEMOGLOBIN AND HEMATOCRIT, BLOOD
HCT: 21.6 % — ABNORMAL LOW (ref 36.0–46.0)
Hemoglobin: 6.7 g/dL — CL (ref 12.0–15.0)

## 2024-06-29 LAB — VITAMIN B12: Vitamin B-12: 561 pg/mL (ref 180–914)

## 2024-06-29 LAB — FERRITIN: Ferritin: 234 ng/mL (ref 11–307)

## 2024-06-29 LAB — FOLATE: Folate: 6.6 ng/mL (ref >5.9)

## 2024-06-29 MED ORDER — SODIUM CHLORIDE 0.9 % IV SOLN
1.0000 g | INTRAVENOUS | Status: DC
  Administered 2024-06-29 – 2024-06-30 (×2): 1 g via INTRAVENOUS
  Filled 2024-06-29 (×2): qty 10

## 2024-06-29 MED ORDER — ENOXAPARIN SODIUM 40 MG/0.4ML IJ SOSY: 40.0000 mg | PREFILLED_SYRINGE | INTRAMUSCULAR | Status: DC

## 2024-06-29 MED ORDER — QUETIAPINE FUMARATE 50 MG PO TABS
50.0000 mg | ORAL_TABLET | Freq: Every day | ORAL | Status: DC
  Administered 2024-06-29 – 2024-07-01 (×3): 50 mg via ORAL
  Filled 2024-06-29 (×3): qty 1

## 2024-06-29 MED ORDER — LEVOTHYROXINE SODIUM 50 MCG PO TABS
175.0000 ug | ORAL_TABLET | Freq: Every day | ORAL | Status: DC
  Administered 2024-06-30: 175 ug via ORAL
  Filled 2024-06-29 (×3): qty 1

## 2024-06-29 MED ORDER — AMLODIPINE BESYLATE 5 MG PO TABS
5.0000 mg | ORAL_TABLET | Freq: Every day | ORAL | Status: DC
  Administered 2024-06-29 – 2024-07-01 (×3): 5 mg via ORAL
  Filled 2024-06-29 (×3): qty 1

## 2024-06-29 MED ORDER — MEMANTINE HCL 10 MG PO TABS
10.0000 mg | ORAL_TABLET | Freq: Two times a day (BID) | ORAL | Status: DC
  Administered 2024-06-29 – 2024-07-01 (×5): 10 mg via ORAL
  Filled 2024-06-29 (×5): qty 1

## 2024-06-29 MED ORDER — METRONIDAZOLE 500 MG/100ML IV SOLN
500.0000 mg | Freq: Two times a day (BID) | INTRAVENOUS | Status: DC
  Administered 2024-06-29 – 2024-07-01 (×5): 500 mg via INTRAVENOUS
  Filled 2024-06-29 (×5): qty 100

## 2024-06-29 MED ORDER — SERTRALINE HCL 100 MG PO TABS
100.0000 mg | ORAL_TABLET | Freq: Two times a day (BID) | ORAL | Status: DC
  Administered 2024-06-29 – 2024-07-01 (×5): 100 mg via ORAL
  Filled 2024-06-29 (×5): qty 1

## 2024-06-29 MED ORDER — ISOSORBIDE MONONITRATE 10 MG PO TABS
10.0000 mg | ORAL_TABLET | Freq: Two times a day (BID) | ORAL | Status: DC
  Administered 2024-06-29 – 2024-07-01 (×5): 10 mg via ORAL
  Filled 2024-06-29 (×5): qty 1

## 2024-06-29 MED ORDER — ADULT MULTIVITAMIN W/MINERALS CH
1.0000 | ORAL_TABLET | Freq: Every day | ORAL | Status: DC
  Administered 2024-06-29 – 2024-06-30 (×2): 1 via ORAL
  Filled 2024-06-29 (×2): qty 1

## 2024-06-29 MED ORDER — HYDRALAZINE HCL 50 MG PO TABS
50.0000 mg | ORAL_TABLET | Freq: Two times a day (BID) | ORAL | Status: DC
  Administered 2024-06-29 – 2024-07-01 (×5): 50 mg via ORAL
  Filled 2024-06-29 (×5): qty 1

## 2024-06-29 MED ORDER — SODIUM CHLORIDE 0.9 % IV SOLN: INTRAVENOUS | Status: AC

## 2024-06-29 MED ORDER — QUETIAPINE FUMARATE 50 MG PO TABS: 50.0000 mg | ORAL_TABLET | ORAL | Status: DC

## 2024-06-29 MED ORDER — QUETIAPINE FUMARATE 50 MG PO TABS
75.0000 mg | ORAL_TABLET | Freq: Every day | ORAL | Status: DC
  Administered 2024-06-29 – 2024-06-30 (×3): 75 mg via ORAL
  Filled 2024-06-29 (×2): qty 1
  Filled 2024-06-29: qty 2

## 2024-06-29 MED ORDER — HEPARIN SODIUM (PORCINE) 5000 UNIT/ML IJ SOLN
5000.0000 [IU] | Freq: Three times a day (TID) | INTRAMUSCULAR | Status: DC
  Administered 2024-06-29 – 2024-06-30 (×5): 5000 [IU] via SUBCUTANEOUS
  Filled 2024-06-29 (×6): qty 1

## 2024-06-29 MED ORDER — BREXPIPRAZOLE 0.5 MG PO TABS
0.5000 mg | ORAL_TABLET | Freq: Every day | ORAL | Status: DC
  Administered 2024-06-29 – 2024-06-30 (×3): 0.5 mg via ORAL
  Filled 2024-06-29 (×3): qty 1

## 2024-06-29 MED ORDER — PANTOPRAZOLE SODIUM 40 MG PO TBEC
40.0000 mg | DELAYED_RELEASE_TABLET | Freq: Two times a day (BID) | ORAL | Status: DC
  Administered 2024-06-29 – 2024-07-01 (×6): 40 mg via ORAL
  Filled 2024-06-29 (×6): qty 1

## 2024-06-29 NOTE — Evaluation (Signed)
 Physical Therapy Evaluation Patient Details Name: [name] MRN: [id number] DOB: [birth year] Today's Date: 06/29/2024  History of Present Illness  89 y.o. female  who presents to ED from facility due to poor po in take and change in mental status over the last few days. Dx of acute diverticulitis, UTI. with medical history significant of hx of subarachnoid hemorrhage s/p fall, Lewy body dementia, hypothyroidism, hypertension, CKDIIIa, anemia , chronic fatigue  Clinical Impression  Assisted pt to roll L and R for gown/linen change 2* incontinence. Pt required total assist to roll. She was not able to follow one step commands nor state her name or birthdate. Pt resisted attempts to assess strength/ROM. She is not able to participate in PT at present. Prior functional level not known, however she did ambulate 41' with PT in October 2024 during a hospitalization. Will follow.         If plan is discharge home, recommend the following: Two people to help with walking and/or transfers;Two people to help with bathing/dressing/bathroom;Assistance with cooking/housework;Assist for transportation;Help with stairs or ramp for entrance   Can travel by private vehicle   No    Equipment Recommendations None recommended by PT  Recommendations for Other Services       Functional Status Assessment Patient has had a recent decline in their functional status and/or demonstrates limited ability to make significant improvements in function in a reasonable and predictable amount of time     Precautions / Restrictions Precautions Precautions: Fall Recall of Precautions/Restrictions: Impaired Restrictions Weight Bearing Restrictions Per Provider Order: No      Mobility  Bed Mobility Overal bed mobility: Needs Assistance Bed Mobility: Rolling Rolling: +2 for physical assistance, Total assist         General bed mobility comments: rolled L and R for pericare    Transfers                    General transfer comment: NT- pt resists attempts to mobilize, not able to follow commands    Ambulation/Gait                  Stairs            Wheelchair Mobility     Tilt Bed    Modified Rankin (Stroke Patients Only)       Balance                                             Pertinent Vitals/Pain Pain Assessment Breathing: normal Negative Vocalization: occasional moan/groan, low speech, negative/disapproving quality Facial Expression: smiling or inexpressive Body Language: tense, distressed pacing, fidgeting Consolability: distracted or reassured by voice/touch PAINAD Score: 3    Home Living Family/patient expects to be discharged to:: Skilled nursing facility                   Additional Comments: per chart pt admitted from [facility]    Prior Function Prior Level of Function : Patient poor historian/Family not available                     Extremity/Trunk Assessment   Upper Extremity Assessment Upper Extremity Assessment: Difficult to assess due to impaired cognition    Lower Extremity Assessment Lower Extremity Assessment: Difficult to assess due to impaired cognition;LLE deficits/detail;RLE deficits/detail RLE Deficits / Details: pt resisted  attempts to assess ROM LLE Deficits / Details: pt resisted attempts to assess ROM       Communication   Communication Communication: Impaired Factors Affecting Communication: Reduced clarity of speech    Cognition Arousal: Alert Behavior During Therapy: Anxious   PT - Cognitive impairments: No family/caregiver present to determine baseline                         Following commands: Impaired Following commands impaired:  (does not follow 1 step commands)     Cueing Cueing Techniques: Verbal cues, Gestural cues, Tactile cues     General Comments      Exercises General Exercises - Lower Extremity Heel Slides: PROM, Both, 5 reps,  Supine Hip ABduction/ADduction: PROM, Both, 5 reps, Supine   Assessment/Plan    PT Assessment Patient needs continued PT services  PT Problem List Decreased activity tolerance;Decreased mobility       PT Treatment Interventions Functional mobility training;Therapeutic activities;Therapeutic exercise    PT Goals (Current goals can be found in the Care Plan section)  Acute Rehab PT Goals PT Goal Formulation: Patient unable to participate in goal setting Time For Goal Achievement: 07/13/24 Potential to Achieve Goals: Fair    Frequency Min 2X/week     Co-evaluation               AM-PAC PT 6 Clicks Mobility  Outcome Measure Help needed turning from your back to your side while in a flat bed without using bedrails?: Total Help needed moving from lying on your back to sitting on the side of a flat bed without using bedrails?: Total Help needed moving to and from a bed to a chair (including a wheelchair)?: Total Help needed standing up from a chair using your arms (e.g., wheelchair or bedside chair)?: Total Help needed to walk in hospital room?: Total Help needed climbing 3-5 steps with a railing? : Total 6 Click Score: 6    End of Session   Activity Tolerance: Other (comment) Patient left: in bed;with bed alarm set;with call bell/phone within reach Nurse Communication: Need for lift equipment;Mobility status PT Visit Diagnosis: Difficulty in walking, not elsewhere classified (R26.2);Other abnormalities of gait and mobility (R26.89)    Time: 9045-8995 PT Time Calculation (min) (ACUTE ONLY): 10 min   Charges:   PT Evaluation $PT Eval Moderate Complexity: 1 Mod   PT General Charges $$ ACUTE PT VISIT: 1 Visit         Sylvan Delon Copp PT 06/29/2024  Acute Rehabilitation Services  Office [phone]

## 2024-06-29 NOTE — Progress Notes (Signed)
   06/29/24 1000  Pre-Screen- If YES to any of the following, STOP the screen, keep NPO, and place order for SLP eval and treat.   Home diet required thickened liquids No - Proceed  Trach tube present No - Proceed  Radiation to Head/Neck No - Proceed  Patient Readiness- If YES to any of the following, WAIT to screen, keep NPO, and place order for SLP eval and treat. May rescreen if clinical improvement WITHIN 24h.    Is patient lethargic or unable to stay alert/awake? No - Proceed  HOB restricted to <30 degrees No - Proceed  NPO for planned procedure No - Proceed  Aspiration Risk Assessment  Is patient oriented to name, place, or year? Yes - Proceed  Able to open mouth, stick out tongue, or smile Yes - Proceed  Able to seal lips Yes - Proceed  Able to move tongue from side to side Yes - Proceed  Face is symmetric Yes - Proceed  Mandatory oral care performed Yes  3 oz Water Challenge  Does the patient stop drinking? No - Proceed  Does the patient cough/choke? No - Proceed  Document PASS or FAIL PASS- Obtain Diet Order

## 2024-06-29 NOTE — Progress Notes (Signed)
   06/29/24 1340  Provider Notification  Provider Name/Title True Atlas, MD  Date Provider Notified 06/29/24  Time Provider Notified 1340  Method of Notification Page (epic chat)  Notification Reason Critical Result  Test performed and critical result hgb 6.7  Date Critical Result Received 06/29/24  Time Critical Result Received 1339  Provider response See new orders  Date of Provider Response 06/29/24  Time of Provider Response 1347   CBC and BMP ordered for tomorrow morning. Occult blood order. No BM to collect yet.

## 2024-06-29 NOTE — Plan of Care (Signed)
  Problem: Clinical Measurements: Goal: Ability to maintain clinical measurements within normal limits will improve Outcome: Progressing Goal: Will remain free from infection Outcome: Progressing Goal: Diagnostic test results will improve Outcome: Progressing Goal: Respiratory complications will improve Outcome: Progressing Goal: Cardiovascular complication will be avoided Outcome: Progressing   Problem: Elimination: Goal: Will not experience complications related to bowel motility Outcome: Progressing Goal: Will not experience complications related to urinary retention Outcome: Progressing   Problem: Education: Goal: Knowledge of General Education information will improve Description: Including pain rating scale, medication(s)/side effects and non-pharmacologic comfort measures Outcome: Not Progressing   Problem: Health Behavior/Discharge Planning: Goal: Ability to manage health-related needs will improve Outcome: Not Progressing   Problem: Activity: Goal: Risk for activity intolerance will decrease Outcome: Not Progressing

## 2024-06-29 NOTE — Evaluation (Signed)
 Occupational Therapy Evaluation Patient Details Name: [name] MRN: [id number] DOB: 11-22-1928 Today's Date: 06/29/2024   History of Present Illness   89 y.o. female  who presents to ED from facility due to poor po in take and change in mental status over the last few days. Dx of acute diverticulitis, UTI. with medical history significant of hx of subarachnoid hemorrhage s/p fall, Lewy body dementia, hypothyroidism, hypertension, CKDIIIa, anemia , chronic fatigue     Clinical Impressions Pt present with decline in function and safety with ADLs and ADL mobility with impaired strength, balance, endurance, hx of dementia. Pt's son reports that PTa pt lived at [facility] and required assist to SPT, some assist with ADLs including self feeding due to dementia and poor appetite. Pt currently requires total A to roll L and R for. Pt unable to follow one step commands nor state her name or birthdate. Pt resisted attempts to assess UE/UB strength/ROM. Pt requires total A for all ADLs/selfcare are this time. OT will follow acutely to maximize level of function and safety     If plan is discharge home, recommend the following:   Two people to help with bathing/dressing/bathroom;A lot of help with walking and/or transfers;Assist for transportation;Help with stairs or ramp for entrance;Direct supervision/assist for financial management     Functional Status Assessment   Patient has had a recent decline in their functional status and demonstrates the ability to make significant improvements in function in a reasonable and predictable amount of time.     Equipment Recommendations   Other (comment) (defer)     Recommendations for Other Services         Precautions/Restrictions   Precautions Precautions: Fall Restrictions Weight Bearing Restrictions Per Provider Order: No     Mobility Bed Mobility Overal bed mobility: Needs Assistance Bed Mobility: Rolling Rolling: Total  assist         General bed mobility comments: rolled L and R for pericare    Transfers                          Balance                                           ADL either performed or assessed with clinical judgement   ADL Overall ADL's : Needs assistance/impaired                                       General ADL Comments: total A with feeding, bathing, dressing, toileting     Vision Baseline Vision/History: 1 Wears glasses Ability to See in Adequate Light: 0 Adequate Patient Visual Report: No change from baseline       Perception         Praxis         Pertinent Vitals/Pain Pain Assessment Pain Assessment: No/denies pain     Extremity/Trunk Assessment Upper Extremity Assessment Upper Extremity Assessment: Generalized weakness;Right hand dominant   Lower Extremity Assessment Lower Extremity Assessment: Defer to PT evaluation       Communication Communication Factors Affecting Communication: Reduced clarity of speech   Cognition Arousal: Alert Behavior During Therapy: Anxious  Following commands: Impaired       Cueing  General Comments   Cueing Techniques: Verbal cues;Gestural cues;Tactile cues      Exercises     Shoulder Instructions      Home Living Family/patient expects to be discharged to:: Skilled nursing facility                                 Additional Comments: per chart pt admitted from [facility]      Prior Functioning/Environment               Mobility Comments: pt's son reports that pt requires assist to SPT ADLs Comments: assist with all ADLs and self feeding, poor appetite    OT Problem List: Decreased strength;Decreased coordination;Impaired balance (sitting and/or standing);Decreased activity tolerance;Decreased cognition   OT Treatment/Interventions: Self-care/ADL training;Therapeutic  exercise;Patient/family education;Balance training;Therapeutic activities;DME and/or AE instruction      OT Goals(Current goals can be found in the care plan section)   Acute Rehab OT Goals Patient Stated Goal: none stated by pt, son states that he would like for her to be able to get OOB OT Goal Formulation: With family Time For Goal Achievement: 07/13/24 Potential to Achieve Goals: Fair ADL Goals Pt Will Perform Eating: with max assist;with mod assist;sitting Pt Will Perform Grooming: with max assist;with mod assist;sitting Pt Will Transfer to Toilet: with max assist;with mod assist;stand pivot transfer;bedside commode Additional ADL Goal #1: Pt will complete bed mobility max-mod A to sit EOB for grooming tasks   OT Frequency:  Min 1X/week    Co-evaluation              AM-PAC OT 6 Clicks Daily Activity     Outcome Measure Help from another person eating meals?: Total Help from another person taking care of personal grooming?: Total Help from another person toileting, which includes using toliet, bedpan, or urinal?: Total Help from another person bathing (including washing, rinsing, drying)?: Total Help from another person to put on and taking off regular upper body clothing?: Total Help from another person to put on and taking off regular lower body clothing?: Total 6 Click Score: 6   End of Session    Activity Tolerance: Patient limited by fatigue Patient left: in bed;with bed alarm set;with family/visitor present  OT Visit Diagnosis: Other abnormalities of gait and mobility (R26.89);Muscle weakness (generalized) (M62.81)                Time: 8650-8592 OT Time Calculation (min): 18 min Charges:  OT General Charges $OT Visit: 1 Visit OT Evaluation $OT Eval Moderate Complexity: 1 Mod    Jacques Karna Loose 06/29/2024, 2:26 PM

## 2024-06-29 NOTE — Progress Notes (Addendum)
 [facility] Progress Note  Patient: [name]     FMW:[id number]  DOA: 06/28/2024   PCP: Marston Junette HERO, MD       Brief hospital course: 89 year old female with history of subarachnoid hemorrhage, falls, Lewy body dementia, hypothyroidism, hypertension, CKD stage IIIa, anemia and chronic fatigue who presented to the hospital for poor oral intake and change in mental status.  She was unable to give a history. In the ED: Found to have creatinine of 4 with a baseline of 1.6 CT of the abdomen pelvis revealed acute uncomplicated diverticulitis of the sigmoid colon and mid descending colon UA also was suggestive of UTI with many bacteria and 11-20 WBCs. Of note, potassium was 5.5.  Subjective:  Very somnolent.  Attempts to speak but speech is incoherent  Assessment and Plan: Active Problems:   AKI (acute kidney injury) Hyperkalemia Metabolic acidosis - Furosemide  and spironolactone on hold - Labs still pending for this morning - Last 2D echo from 2015 showed a normal EF and grade 1 diastolic dysfunction - Start normal saline at 75 cc an hour for maintenance fluids until oral intake improves  Acute diverticulitis - Started on ceftriaxone  and Flagyl  UTI - Follow-up urine culture  Lewy body dementia - Continue Namenda  and Seroquel   Macrocytic anemia - Anemia panel does not reveal iron, folate or B12 deficiency  Mild troponin elevation - Troponin 29 and 26-hold off on further workup     Code Status: Limited: Do not attempt resuscitation (DNR) -DNR-LIMITED -Do Not Intubate/DNI  Total time on patient care: 35 minutes DVT prophylaxis:  Lovenox      Objective:   Vitals:   06/29/24 0153 06/29/24 0206 06/29/24 0444 06/29/24 0549  BP:  (!) 128/57 (!) 116/53   Pulse:  68 75   Resp:  11 18   Temp: 98.5 F (36.9 C)  (!) 97.4 F (36.3 C)   TempSrc: Oral  Oral   SpO2:  95% 100%   Weight:    50.1 kg  Height:    5' 3 (1.6 m)   Filed Weights   06/29/24 0549   Weight: 50.1 kg   Exam: General exam: Appears comfortable-quite somnolent but does awaken-will not follow commands and speech is nonsensical HEENT: oral mucosa moist Respiratory system: Clear to auscultation.  Cardiovascular system: S1 & S2 heard  Gastrointestinal system: Abdomen soft, non-tender, nondistended. Normal bowel sounds   Extremities: No cyanosis, clubbing or edema Psychiatry:  Mood & affect appropriate.      CBC: Recent Labs  Lab 06/28/24 1850  WBC 8.4  NEUTROABS 5.9  HGB 8.0*  HCT 25.2*  MCV 103.7*  PLT 246   Basic Metabolic Panel: Recent Labs  Lab 06/28/24 1850  NA 139  K 5.5*  CL 111  CO2 16*  GLUCOSE 98  BUN 99*  CREATININE 4.03*  CALCIUM  9.2     Scheduled Meds:  amLODipine   5 mg Oral Daily   Brexpiprazole   0.5 mg Oral QHS   hydrALAZINE   50 mg Oral BID   isosorbide mononitrate  10 mg Oral BID   levothyroxine   175 mcg Oral Q0600   memantine   10 mg Oral BID   multivitamin with minerals  1 tablet Oral Q lunch   pantoprazole   40 mg Oral BID   QUEtiapine   50 mg Oral Daily   And   QUEtiapine   75 mg Oral QHS   sertraline   100 mg Oral BID    Imaging and lab data personally reviewed  Author: Lexine Jaspers  06/29/2024 7:44 AM  To contact [facility]>   Check the care team in [facility] and look for the attending/consulting [facility] provider listed  Log into [email or web address] and use [facility] universal password   Go to> [facility]  and find provider  If you still have difficulty reaching the provider, please page the [facility] (Director on Call) for the Hospitalists listed on amion

## 2024-06-29 NOTE — TOC Initial Note (Addendum)
 Transition of Care [facility]) - Initial/Assessment Note    Patient Details  Name: [name] MRN: [id number] Date of Birth: [birth year]  [facility] CM/SW Contact:    Alfonse JONELLE Rex, RN Phone Number: 06/29/2024, 11:27 AM  Clinical Narrative:   Patient resides at [facility], plan to return when ready for discharge.  Starr at [place] confirmed pt is a LTC resident and able to return at dc.                Expected Discharge Plan: Skilled Nursing Facility [facility]) Barriers to Discharge: Continued Medical Work up   Patient Goals and CMS Choice Patient states their goals for this hospitalization and ongoing recovery are:: return to [facility]          Expected Discharge Plan and Services       Living arrangements for the past 2 months: Skilled Nursing Facility [facility])                                      Prior Living Arrangements/Services Living arrangements for the past 2 months: Skilled Nursing Facility [facility])                     Activities of Daily Living   ADL Screening (condition at time of admission) Independently performs ADLs?: No Does the patient have a NEW difficulty with bathing/dressing/toileting/self-feeding that is expected to last >3 days?: Yes (Initiates electronic notice to provider for possible OT consult) Does the patient have a NEW difficulty with getting in/out of bed, walking, or climbing stairs that is expected to last >3 days?: Yes (Initiates electronic notice to provider for possible PT consult) Does the patient have a NEW difficulty with communication that is expected to last >3 days?: Yes (Initiates electronic notice to provider for possible SLP consult) Is the patient deaf or have difficulty hearing?: Yes Does the patient have difficulty seeing, even when wearing glasses/contacts?: Yes Does the patient have difficulty concentrating,  remembering, or making decisions?: Yes  Permission Sought/Granted                  Emotional Assessment              Admission diagnosis:  Dehydration [E86.0] Diverticulitis [K57.92] UTI (urinary tract infection) [N39.0] AKI (acute kidney injury) [N17.9] Patient Active Problem List   Diagnosis Date Noted   AKI (acute kidney injury) 06/29/2024   Pain due to onychomycosis of toenails of both feet 08/19/2023   Acute encephalopathy 05/21/2023   Dysarthria 05/21/2023   Hypokalemia 05/21/2023   Hypertensive urgency 05/21/2023   Dementia without behavioral disturbance (HCC) 05/21/2023   Anxiety and depression 05/21/2023   Hypocalcemia 05/21/2023   GERD (gastroesophageal reflux disease) 05/21/2023   Fall 12/10/2022   Weakness 05/04/2022   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   CKD (chronic kidney disease) stage 3, GFR 30-59 ml/min (HCC) 03/31/2019   Anemia 03/07/2015   Transaminitis 11/20/2014   Hyponatremia 10/09/2014   Lightheaded 08/30/2014   Right hip pain 07/25/2014   Chronic neck pain 06/06/2014   Memory loss 06/23/2013   Right shoulder pain 06/23/2013   Eczema 01/16/2013   Leg edema 08/23/2012   Chronic insomnia 08/09/2012   Renal insufficiency 03/31/2011   CYSTOCELE WITHOUT MENTION UTERINE PROLAPSE LAT 08/29/2010   Anemia of chronic disease  05/06/2010   LIVER FUNCTION TESTS, ABNORMAL, HX OF 01/14/2009   Hypothyroidism 04/04/2007   Adjustment disorder with mixed anxiety and depressed mood 04/04/2007   Essential hypertension 04/04/2007   PCP:  Marston Junette HERO, MD Pharmacy:   [facility] - [place], [place] - [address] [place] [address] [place] [address] [place] [address] Phone: [phone] Fax: [phone]     Social Drivers of Health (SDOH) Social History: SDOH Screenings   Food Insecurity: No Food Insecurity (06/05/2022)  Housing: Low Risk  (09/25/2020)  Transportation Needs: No Transportation Needs (06/05/2022)  Alcohol  Screen: Low Risk  (09/25/2020)  Depression  (PHQ2-9): Low Risk  (06/05/2022)  Financial Resource Strain: Low Risk  (06/05/2022)  Physical Activity: Inactive (06/05/2022)  Social Connections: Moderately Isolated (09/25/2020)  Stress: Stress Concern Present (06/05/2022)  Tobacco Use: Medium Risk (06/28/2024)   SDOH Interventions:     Readmission Risk Interventions    06/29/2024   11:26 AM  Readmission Risk Prevention Plan  Transportation Screening Complete  PCP or Specialist Appt within 5-7 Days Complete  Home Care Screening Complete  Medication Review (RN CM) Complete

## 2024-06-30 DIAGNOSIS — K5792 Diverticulitis of intestine, part unspecified, without perforation or abscess without bleeding: Secondary | ICD-10-CM | POA: Diagnosis not present

## 2024-06-30 LAB — CBC
HCT: 22.3 % — ABNORMAL LOW (ref 36.0–46.0)
Hemoglobin: 7 g/dL — ABNORMAL LOW (ref 12.0–15.0)
MCH: 33.7 pg (ref 26.0–34.0)
MCHC: 31.4 g/dL (ref 30.0–36.0)
MCV: 107.2 fL — ABNORMAL HIGH (ref 80.0–100.0)
Platelets: 225 K/uL (ref 150–400)
RBC: 2.08 MIL/uL — ABNORMAL LOW (ref 3.87–5.11)
RDW: 13.7 % (ref 11.5–15.5)
WBC: 5.6 K/uL (ref 4.0–10.5)
nRBC: 0 % (ref 0.0–0.2)

## 2024-06-30 LAB — BASIC METABOLIC PANEL WITH GFR
Anion gap: 9 (ref 5–15)
BUN: 73 mg/dL — ABNORMAL HIGH (ref 8–23)
CO2: 15 mmol/L — ABNORMAL LOW (ref 22–32)
Calcium: 8.7 mg/dL — ABNORMAL LOW (ref 8.9–10.3)
Chloride: 120 mmol/L — ABNORMAL HIGH (ref 98–111)
Creatinine, Ser: 2.59 mg/dL — ABNORMAL HIGH (ref 0.44–1.00)
GFR, Estimated: 17 mL/min — ABNORMAL LOW (ref >60)
Glucose, Bld: 84 mg/dL (ref 70–99)
Potassium: 5.1 mmol/L (ref 3.5–5.1)
Sodium: 144 mmol/L (ref 135–145)

## 2024-06-30 NOTE — Progress Notes (Signed)
 [facility] Progress Note  Patient: [name]     FMW:[id number]  DOA: 06/28/2024   PCP: Marston Junette HERO, MD       Brief hospital course: 89 year old female with history of subarachnoid hemorrhage, falls, Lewy body dementia, hypothyroidism, hypertension, CKD stage IIIa, anemia and chronic fatigue who presented to the hospital for poor oral intake and change in mental status.  She was unable to give a history. In the ED: Found to have creatinine of 4 with a baseline of 1.6 CT of the abdomen pelvis revealed acute uncomplicated diverticulitis of the sigmoid colon and mid descending colon UA also was suggestive of UTI with many bacteria and 11-20 WBCs. Of note, potassium was 5.5.  Subjective:  Not able to answer questions.  Speech mostly nonsensical.  Assessment and Plan: Active Problems:   AKI (acute kidney injury) Hyperkalemia Metabolic acidosis - Furosemide  and spironolactone on hold - Last 2D echo from 2015 showed a normal EF and grade 1 diastolic dysfunction - Start normal saline at 75 cc an hour for maintenance fluids until oral intake improves - Creatinine improved from 4.03-2.59-stop IV fluids and follow-up on oral intake - last ECHO from 2015 showed a normal EF and no diastolic dysfunction  Acute diverticulitis? -Noted on imaging-unable to obtain history and exam is difficult as no matter where I pressed on her abdomen, she appears uncomfortable - Started on ceftriaxone  and Flagyl   UTI? - cannot communicate if she has symptoms - Urine culture growing greater than 100,000 colonies of gram-negative rods  Lewy body dementia - Continue Namenda  and Seroquel   Macrocytic anemia - Anemia panel does not reveal iron, folate or B12 deficiency  Mild troponin elevation - Troponin 29 and 26-hold off on further workup  Updated her brother today     Code Status: Limited: Do not attempt resuscitation (DNR) -DNR-LIMITED -Do Not Intubate/DNI  Total time on patient  care: 35 minutes DVT prophylaxis:  heparin  injection 5,000 Units Start: 06/29/24 1430Lovenox     Objective:   Vitals:   06/29/24 1312 06/29/24 1836 06/29/24 2051 06/30/24 0947  BP: (!) 106/48 134/64 126/67 136/62  Pulse: 74 73 75 65  Resp: 16 18 15    Temp: 97.7 F (36.5 C) 98 F (36.7 C) 98.3 F (36.8 C) 98.4 F (36.9 C)  TempSrc: Axillary Oral Oral Oral  SpO2: 97% 98% 97% 98%  Weight:      Height:       Filed Weights   06/29/24 0549  Weight: 50.1 kg   Exam: General exam: Appears comfortable-alert-able to state her name but speech is very slurred  HEENT: oral mucosa moist Respiratory system: Clear to auscultation.  Cardiovascular system: S1 & S2 heard  Gastrointestinal system: Abdomen soft, winces when her abdomen is touched, nondistended. Normal bowel sounds Neurology: Alert, not following commands but moves arms and attempts to communicate but speech is slurred Extremities: No cyanosis, clubbing or edema   CBC: Recent Labs  Lab 06/28/24 1850 06/29/24 0823 06/29/24 1304 06/30/24 0459  WBC 8.4 6.1  --  5.6  NEUTROABS 5.9  --   --   --   HGB 8.0* 7.6* 6.7* 7.0*  HCT 25.2* 24.2* 21.6* 22.3*  MCV 103.7* 105.2*  --  107.2*  PLT 246 238  --  225   Basic Metabolic Panel: Recent Labs  Lab 06/28/24 1850 06/29/24 0823 06/30/24 0459  NA 139 141 144  K 5.5* 5.1 5.1  CL 111 115* 120*  CO2 16* 15* 15*  GLUCOSE  98 88 84  BUN 99* 88* 73*  CREATININE 4.03* 3.18* 2.59*  CALCIUM  9.2 9.0 8.7*     Scheduled Meds:  amLODipine   5 mg Oral Daily   Brexpiprazole   0.5 mg Oral QHS   heparin  injection (subcutaneous)  5,000 Units Subcutaneous Q8H   hydrALAZINE   50 mg Oral BID   isosorbide  mononitrate  10 mg Oral BID   levothyroxine   175 mcg Oral Q0600   memantine   10 mg Oral BID   multivitamin with minerals  1 tablet Oral Q lunch   pantoprazole   40 mg Oral BID   QUEtiapine   50 mg Oral Daily   And   QUEtiapine   75 mg Oral QHS   sertraline   100 mg Oral BID    Imaging  and lab data personally reviewed   Author: Obediah Welles  06/30/2024 1:25 PM  To contact [facility]>   Check the care team in [facility] and look for the attending/consulting [facility] provider listed  Log into [email or web address] and use [place]'s universal password   Go to> [facility]  and find provider  If you still have difficulty reaching the provider, please page the [facility] (Director on Call) for the Hospitalists listed on amion

## 2024-06-30 NOTE — Evaluation (Signed)
 SLP Cancellation Note  Patient Details Name: [name] MRN: [id number] DOB: 1928/12/30   Cancelled treatment:       Reason Eval/Treat Not Completed: Other (comment) (Pt has diagnosis of lewey body dementia, if require SLP for cognitive evaluation - would recommend pursue at next venue of care when she is well.  Thanks.)   Nicolas Emmie Caldron 06/30/2024, 3:18 PM   Madelin POUR, MS [facility] SLP Acute Rehab Services Office [phone]

## 2024-06-30 NOTE — Plan of Care (Signed)

## 2024-07-01 DIAGNOSIS — G3183 Dementia with Lewy bodies: Secondary | ICD-10-CM | POA: Diagnosis not present

## 2024-07-01 DIAGNOSIS — M5134 Other intervertebral disc degeneration, thoracic region: Secondary | ICD-10-CM | POA: Diagnosis not present

## 2024-07-01 DIAGNOSIS — G9341 Metabolic encephalopathy: Secondary | ICD-10-CM | POA: Diagnosis not present

## 2024-07-01 DIAGNOSIS — Z7401 Bed confinement status: Secondary | ICD-10-CM | POA: Diagnosis not present

## 2024-07-01 DIAGNOSIS — R471 Dysarthria and anarthria: Secondary | ICD-10-CM | POA: Diagnosis not present

## 2024-07-01 DIAGNOSIS — E875 Hyperkalemia: Secondary | ICD-10-CM | POA: Diagnosis not present

## 2024-07-01 DIAGNOSIS — R404 Transient alteration of awareness: Secondary | ICD-10-CM | POA: Diagnosis not present

## 2024-07-01 DIAGNOSIS — M4986 Spondylopathy in diseases classified elsewhere, lumbar region: Secondary | ICD-10-CM | POA: Diagnosis not present

## 2024-07-01 DIAGNOSIS — R5381 Other malaise: Secondary | ICD-10-CM | POA: Diagnosis not present

## 2024-07-01 DIAGNOSIS — R1310 Dysphagia, unspecified: Secondary | ICD-10-CM | POA: Diagnosis not present

## 2024-07-01 DIAGNOSIS — D464 Refractory anemia, unspecified: Secondary | ICD-10-CM | POA: Diagnosis not present

## 2024-07-01 DIAGNOSIS — M6281 Muscle weakness (generalized): Secondary | ICD-10-CM | POA: Diagnosis not present

## 2024-07-01 DIAGNOSIS — I959 Hypotension, unspecified: Secondary | ICD-10-CM | POA: Diagnosis not present

## 2024-07-01 DIAGNOSIS — E039 Hypothyroidism, unspecified: Secondary | ICD-10-CM | POA: Diagnosis not present

## 2024-07-01 DIAGNOSIS — R2681 Unsteadiness on feet: Secondary | ICD-10-CM | POA: Diagnosis not present

## 2024-07-01 DIAGNOSIS — F0283 Dementia in other diseases classified elsewhere, unspecified severity, with mood disturbance: Secondary | ICD-10-CM | POA: Diagnosis not present

## 2024-07-01 DIAGNOSIS — N179 Acute kidney failure, unspecified: Secondary | ICD-10-CM | POA: Diagnosis not present

## 2024-07-01 DIAGNOSIS — Z9181 History of falling: Secondary | ICD-10-CM | POA: Diagnosis not present

## 2024-07-01 DIAGNOSIS — M4984 Spondylopathy in diseases classified elsewhere, thoracic region: Secondary | ICD-10-CM | POA: Diagnosis not present

## 2024-07-01 DIAGNOSIS — Z741 Need for assistance with personal care: Secondary | ICD-10-CM | POA: Diagnosis not present

## 2024-07-01 DIAGNOSIS — K5732 Diverticulitis of large intestine without perforation or abscess without bleeding: Secondary | ICD-10-CM | POA: Diagnosis not present

## 2024-07-01 DIAGNOSIS — G9340 Encephalopathy, unspecified: Secondary | ICD-10-CM | POA: Diagnosis not present

## 2024-07-01 DIAGNOSIS — R2689 Other abnormalities of gait and mobility: Secondary | ICD-10-CM | POA: Diagnosis not present

## 2024-07-01 DIAGNOSIS — R1311 Dysphagia, oral phase: Secondary | ICD-10-CM | POA: Diagnosis not present

## 2024-07-01 DIAGNOSIS — M6259 Muscle wasting and atrophy, not elsewhere classified, multiple sites: Secondary | ICD-10-CM | POA: Diagnosis not present

## 2024-07-01 DIAGNOSIS — E1122 Type 2 diabetes mellitus with diabetic chronic kidney disease: Secondary | ICD-10-CM | POA: Diagnosis not present

## 2024-07-01 DIAGNOSIS — N390 Urinary tract infection, site not specified: Secondary | ICD-10-CM | POA: Diagnosis not present

## 2024-07-01 DIAGNOSIS — N183 Chronic kidney disease, stage 3 unspecified: Secondary | ICD-10-CM | POA: Diagnosis not present

## 2024-07-01 DIAGNOSIS — R41841 Cognitive communication deficit: Secondary | ICD-10-CM | POA: Diagnosis not present

## 2024-07-01 LAB — URINE CULTURE: Culture: >=100000 — AB

## 2024-07-01 LAB — BASIC METABOLIC PANEL WITH GFR
Anion gap: 8 (ref 5–15)
BUN: 50 mg/dL — ABNORMAL HIGH (ref 8–23)
CO2: 15 mmol/L — ABNORMAL LOW (ref 22–32)
Calcium: 9 mg/dL (ref 8.9–10.3)
Chloride: 120 mmol/L — ABNORMAL HIGH (ref 98–111)
Creatinine, Ser: 1.83 mg/dL — ABNORMAL HIGH (ref 0.44–1.00)
GFR, Estimated: 25 mL/min — ABNORMAL LOW (ref >60)
Glucose, Bld: 92 mg/dL (ref 70–99)
Potassium: 4.6 mmol/L (ref 3.5–5.1)
Sodium: 144 mmol/L (ref 135–145)

## 2024-07-01 MED ORDER — METRONIDAZOLE 500 MG PO TABS: 500.0000 mg | ORAL_TABLET | Freq: Three times a day (TID) | ORAL | 0 refills | Status: AC

## 2024-07-01 MED ORDER — ENSURE PLUS HIGH PROTEIN PO LIQD
237.0000 mL | Freq: Two times a day (BID) | ORAL | Status: DC
  Administered 2024-07-01: 237 mL via ORAL

## 2024-07-01 MED ORDER — CEFDINIR 300 MG PO CAPS: 300.0000 mg | ORAL_CAPSULE | Freq: Two times a day (BID) | ORAL | 0 refills | Status: AC

## 2024-07-01 MED ORDER — ONE-DAILY MULTI VITAMINS PO TABS: 1.0000 | ORAL_TABLET | ORAL | Status: AC

## 2024-07-01 NOTE — Discharge Summary (Signed)
 Physician Discharge Summary  [name] FMW:[id number] DOB: 1928/11/26 DOA: 06/28/2024  PCP: Marston Junette HERO, MD  Admit date: 06/28/2024 Discharge date: 07/01/2024   Recommendations for Outpatient Follow-up:  F/u on renal function and Bicarb level in 1 wk- diuretics are on hold     Brief hospital course: 89 year old female with history of subarachnoid hemorrhage, falls, Lewy body dementia, hypothyroidism, hypertension, CKD stage IIIa, anemia and chronic fatigue who presented to the hospital for poor oral intake and change in mental status.  She was unable to give a history. In the ED: Found to have creatinine of 4 with a baseline of 1.6 CT of the abdomen pelvis revealed acute uncomplicated diverticulitis of the sigmoid colon and mid descending colon UA also was suggestive of UTI with many bacteria and 11-20 WBCs. Of note, potassium was 5.5.   Subjective:  Not able to answer questions.  Speech mostly nonsensical.   Assessment and Plan: Active Problems: AKI (acute kidney injury)- CKD 3b Hyperkalemia Metabolic acidosis - Furosemide  and spironolactone on hold - Last 2D echo from 2015 showed a normal EF and grade 1 diastolic dysfunction - Start normal saline at 75 cc an hour for maintenance fluids   - Creatinine improved from 4.03> 1.83 - baseline Cr is about 1.6 - Bicarb still low at 15 - last ECHO from 2015 showed a normal EF and no diastolic dysfunction - will dc her diuretics for now- resuming them can be revisited by her PCP   Acute diverticulitis -Noted on imaging-unable to obtain history due to her dementia - Started on ceftriaxone  and Flagyl - switching to Ceftin and oral Flagyl  on dc    UTI? - cannot communicate if she has symptoms - Urine culture growing greater than 100,000 colonies of gram-negative rods> K pneumoniae and E coli- sensitive to Rocephin     Lewy body dementia - Continue Namenda  and Seroquel  - does not communicate or follow commands   Macrocytic  anemia - Hgb > 7-8 - Anemia panel does not reveal iron, folate or B12 deficiency   Mild troponin elevation - Troponin 29 and 26-hold off on further workup   Underweight  Body mass index is 19.57 kg/m.            Discharge Instructions   Allergies as of 07/01/2024       Reactions   Lactose Intolerance (gi) Other (See Comments)   Reaction not stated on MAR   Oxycodone Hcl Itching        Medication List     STOP taking these medications    furosemide  20 MG tablet Commonly known as: LASIX    spironolactone 25 MG tablet Commonly known as: ALDACTONE       TAKE these medications    amLODipine  10 MG tablet Commonly known as: NORVASC  TAKE 1 TABLET BY MOUTH DAILY   cefdinir  300 MG capsule Commonly known as: OMNICEF  Take 1 capsule (300 mg total) by mouth 2 (two) times daily for 3 days.   hydrALAZINE  50 MG tablet Commonly known as: APRESOLINE  Take 50 mg by mouth 2 (two) times daily. hold for a Systolic reading less than 120   isosorbide  mononitrate 10 MG tablet Commonly known as: ISMO  Take 20 mg by mouth 2 (two) times daily. Hold for SBP<110   levothyroxine  175 MCG tablet Commonly known as: SYNTHROID  Take 175 mcg by mouth daily before breakfast.   memantine  10 MG tablet Commonly known as: NAMENDA  TAKE 1 TABLET BY MOUTH IN THE  MORNING AND AT BEDTIME  metroNIDAZOLE  500 MG tablet Commonly known as: FLAGYL  Take 1 tablet (500 mg total) by mouth 3 (three) times daily for 3 days.   mirtazapine 15 MG tablet Commonly known as: REMERON Take 15 mg by mouth at bedtime.   multivitamin tablet Take 1 tablet by mouth See admin instructions. Take 1 tablet by mouth once a day with food   multivitamins with iron Tabs tablet Take 1 tablet by mouth daily.   olmesartan  40 MG tablet Commonly known as: BENICAR  Take 40 mg by mouth daily. Hold for SBP<110   pantoprazole  40 MG tablet Commonly known as: PROTONIX  Take 40 mg by mouth at bedtime.   polyethylene glycol  17 g packet Commonly known as: MIRALAX  / GLYCOLAX  Take 17 g by mouth daily as needed for mild constipation. What changed: when to take this   PreserVision AREDS Tabs Take 1 tablet by mouth daily with lunch.   QUEtiapine  50 MG tablet Commonly known as: SEROQUEL  TAKE 1 TABLET BY MOUTH TWICE  DAILY What changed:  how much to take how to take this when to take this additional instructions   Rexulti  0.5 MG Tabs Generic drug: Brexpiprazole  TAKE 1 TABLET BY MOUTH AT NIGHT What changed:  how much to take how to take this when to take this additional instructions   sertraline  100 MG tablet Commonly known as: Zoloft  Take 1 tablet (100 mg total) by mouth in the morning and at bedtime.   TYLENOL  500 MG tablet Generic drug: acetaminophen  Take 1,000 mg by mouth in the morning and at bedtime.   Vitamin D  (Ergocalciferol ) 1.25 MG (50000 UNIT) Caps capsule Commonly known as: DRISDOL Take 50,000 Units by mouth every 7 (seven) days. Takes on Fridays            The results of significant diagnostics from this hospitalization (including imaging, microbiology, ancillary and laboratory) are listed below for reference.    CT Renal Stone Study Result Date: 06/28/2024 CLINICAL DATA:  Abdominal and flank pain. EXAM: CT ABDOMEN AND PELVIS WITHOUT CONTRAST TECHNIQUE: Multidetector CT imaging of the abdomen and pelvis was performed following the standard protocol without IV contrast. RADIATION DOSE REDUCTION: This exam was performed according to the departmental dose-optimization program which includes automated exposure control, adjustment of the mA and/or kV according to patient size and/or use of iterative reconstruction technique. COMPARISON:  CT chest abdomen and pelvis 12/09/2022. FINDINGS: Lower chest: No acute abnormality. Hepatobiliary: No focal liver abnormality is seen. Status post cholecystectomy. No biliary dilatation. There are calcified granulomas in the liver. Pancreas:  Unremarkable. No pancreatic ductal dilatation or surrounding inflammatory changes. Spleen: Normal in size without focal abnormality. There are calcified granulomas in the spleen. Adrenals/Urinary Tract: Adrenal glands are unremarkable. Kidneys are normal, without renal calculi, focal lesion, or hydronephrosis. Bladder is unremarkable. Stomach/Bowel: There is diffuse colonic diverticulosis. There is wall thickening and mild inflammation of the sigmoid colon and mid descending colon worrisome for acute diverticulitis. There is no evidence for perforation or abscess. There is no bowel obstruction. The appendix is not seen. Stomach and small bowel loops are within normal limits. Vascular/Lymphatic: There are atherosclerotic calcifications of the aorta, branch vessels and iliac arteries. There is a calcified splenic artery aneurysm near the hilum of the spleen measuring 11 mm which is unchanged from 2024. No enlarged lymph nodes are identified. Reproductive: Status post hysterectomy. No adnexal masses. Other: No abdominal wall hernia or abnormality. No abdominopelvic ascites. Musculoskeletal: The bones are diffusely osteopenic. Degenerative changes affect the spine. L3 vertebral  body hemangioma present. IMPRESSION: 1. Acute uncomplicated diverticulitis of the sigmoid colon and mid descending colon. 2. Aortic atherosclerosis. Aortic Atherosclerosis (ICD10-I70.0). Electronically Signed   By: Greig Pique M.D.   On: 06/28/2024 21:42   CT HEAD WO CONTRAST ( ) Result Date: 06/28/2024 EXAM: CT HEAD WITHOUT CONTRAST 06/28/2024 09:16:00 PM TECHNIQUE: CT of the head was performed without the administration of intravenous contrast. Automated exposure control, iterative reconstruction, and/or weight based adjustment of the mA/kV was utilized to reduce the radiation dose to as low as reasonably achievable. COMPARISON: 01/13/2024 CLINICAL HISTORY: Neuro deficit, acute, stroke suspected. FINDINGS: BRAIN AND VENTRICLES: No acute  hemorrhage. No evidence of acute infarct. Proportional prominence of ventricles and sulci, consistent with diffuse cerebral parenchymal volume loss. Periventricular and subcortical white matter hypoattenuation, consistent with moderate chronic ischemic microvascular disease. Calcified atherosclerotic plaque in cavernous/supraclinoid internal carotid arteries. No hydrocephalus. No extra-axial collection. No mass effect or midline shift. ORBITS: Bilateral lens replacement. SINUSES: No acute abnormality. SOFT TISSUES AND SKULL: No acute soft tissue abnormality. No skull fracture. IMPRESSION: 1. No acute intracranial abnormality. 2. Diffuse cerebral parenchymal volume loss. 3. Moderate chronic ischemic microvascular disease. Electronically signed by: Donnice Mania MD 06/28/2024 09:41 PM EST RP Workstation: [id number]   DG Chest Port 1 View Result Date: 06/28/2024 EXAM: 1 VIEW(S) XRAY OF THE CHEST 06/28/2024 06:50:00 PM COMPARISON: 01/13/2024 CLINICAL HISTORY: AMS FINDINGS: LINES, TUBES AND DEVICES: Right upper quadrant surgical clips noted. ACDF hardware in lower cervical spine. LUNGS AND PLEURA: Superior right upper lung benign calcified granuloma. No pleural effusion. No pneumothorax. HEART AND MEDIASTINUM: Moderate atherosclerotic calcifications in aortic arch. BONES AND SOFT TISSUES: Moderate multilevel degenerative disc changes of thoracic spine. No acute osseous abnormality. IMPRESSION: 1. No acute cardiopulmonary process. Electronically signed by: Kate Plummer MD 06/28/2024 07:34 PM EST RP Workstation: [id number]   Labs:   Basic Metabolic Panel: Recent Labs  Lab 06/28/24 1850 06/29/24 0823 06/30/24 0459 07/01/24 0742  NA 139 141 144 144  K 5.5* 5.1 5.1 4.6  CL 111 115* 120* 120*  CO2 16* 15* 15* 15*  GLUCOSE 98 88 84 92  BUN 99* 88* 73* 50*  CREATININE 4.03* 3.18* 2.59* 1.83*  CALCIUM  9.2 9.0 8.7* 9.0     CBC: Recent Labs  Lab 06/28/24 1850 06/29/24 0823 06/29/24 1304 06/30/24 0459   WBC 8.4 6.1  --  5.6  NEUTROABS 5.9  --   --   --   HGB 8.0* 7.6* 6.7* 7.0*  HCT 25.2* 24.2* 21.6* 22.3*  MCV 103.7* 105.2*  --  107.2*  PLT 246 238  --  225         SIGNED:   True Atlas, MD  [facility] 07/01/2024, 9:59 AM Time taking on discharge: 50 minutes

## 2024-07-01 NOTE — Progress Notes (Signed)
 Patient discharged to [facility] via [facility], report called to receiving nurse

## 2024-07-01 NOTE — TOC Transition Note (Addendum)
 Transition of Care [facility]) - Discharge Note   Patient Details  Name: [name] MRN: [id number] Date of Birth: 1929/07/13  [facility]) CM/SW Contact:  Sonda Manuella Quill, RN Phone Number: 07/01/2024, 10:16 AM   Clinical Narrative:    D/C orders received; pt from [facility]; spoke w/ [place], Admissions; she said pt can return; she also gave RM # [id number], call report # [phone]; transport by [facility]; LVM for pt's son Brianni Manthe [phone]); [facility] called for transport at [phone]; spoke w/ operator # [phone]; no IP CM needs.  -1030- return call from pt's son; he agreed w/ d/c plan; no IP CM needs. Final next level of care: Long Term Nursing Home Barriers to Discharge: No Barriers Identified   Patient Goals and CMS Choice Patient states their goals for this hospitalization and ongoing recovery are:: return to [facility]          Discharge Placement                Patient to be transferred to facility by: [facility] Name of family member notified: LVM for Kaytelyn Glore (son) [phone] Patient and family notified of of transfer: 07/01/24  Discharge Plan and Services Additional resources added to the After Visit Summary for                  DME Arranged: N/A DME Agency: NA       HH Arranged: NA HH Agency: NA        Social Drivers of Health (SDOH) Interventions SDOH Screenings   Food Insecurity: No Food Insecurity (06/05/2022)  Housing: Low Risk  (09/25/2020)  Transportation Needs: No Transportation Needs (06/05/2022)  Alcohol  Screen: Low Risk  (09/25/2020)  Depression (PHQ2-9): Low Risk  (06/05/2022)  Financial Resource Strain: Low Risk  (06/05/2022)  Physical Activity: Inactive (06/05/2022)  Social Connections: Moderately Isolated (09/25/2020)  Stress: Stress Concern Present (06/05/2022)  Tobacco Use: Medium Risk (06/28/2024)     Readmission Risk Interventions    06/29/2024   11:26 AM  Readmission Risk Prevention  Plan  Transportation Screening Complete  PCP or Specialist Appt within 5-7 Days Complete  Home Care Screening Complete  Medication Review (RN CM) Complete

## 2024-07-03 DIAGNOSIS — G9341 Metabolic encephalopathy: Secondary | ICD-10-CM | POA: Diagnosis not present

## 2024-07-03 DIAGNOSIS — E1122 Type 2 diabetes mellitus with diabetic chronic kidney disease: Secondary | ICD-10-CM | POA: Diagnosis not present

## 2024-07-03 DIAGNOSIS — M4984 Spondylopathy in diseases classified elsewhere, thoracic region: Secondary | ICD-10-CM | POA: Diagnosis not present

## 2024-07-03 DIAGNOSIS — K5732 Diverticulitis of large intestine without perforation or abscess without bleeding: Secondary | ICD-10-CM | POA: Diagnosis not present

## 2024-07-03 DIAGNOSIS — E875 Hyperkalemia: Secondary | ICD-10-CM | POA: Diagnosis not present

## 2024-07-03 DIAGNOSIS — N179 Acute kidney failure, unspecified: Secondary | ICD-10-CM | POA: Diagnosis not present

## 2024-07-03 DIAGNOSIS — D464 Refractory anemia, unspecified: Secondary | ICD-10-CM | POA: Diagnosis not present

## 2024-07-03 DIAGNOSIS — E039 Hypothyroidism, unspecified: Secondary | ICD-10-CM | POA: Diagnosis not present

## 2024-07-03 DIAGNOSIS — F0283 Dementia in other diseases classified elsewhere, unspecified severity, with mood disturbance: Secondary | ICD-10-CM | POA: Diagnosis not present

## 2024-07-03 DIAGNOSIS — M5134 Other intervertebral disc degeneration, thoracic region: Secondary | ICD-10-CM | POA: Diagnosis not present

## 2024-07-03 DIAGNOSIS — G3183 Dementia with Lewy bodies: Secondary | ICD-10-CM | POA: Diagnosis not present

## 2024-07-03 DIAGNOSIS — N183 Chronic kidney disease, stage 3 unspecified: Secondary | ICD-10-CM | POA: Diagnosis not present

## 2024-07-04 DIAGNOSIS — R2689 Other abnormalities of gait and mobility: Secondary | ICD-10-CM | POA: Diagnosis not present

## 2024-07-04 DIAGNOSIS — R41841 Cognitive communication deficit: Secondary | ICD-10-CM | POA: Diagnosis not present

## 2024-07-04 DIAGNOSIS — G9341 Metabolic encephalopathy: Secondary | ICD-10-CM | POA: Diagnosis not present

## 2024-07-04 DIAGNOSIS — K5732 Diverticulitis of large intestine without perforation or abscess without bleeding: Secondary | ICD-10-CM | POA: Diagnosis not present

## 2024-07-04 DIAGNOSIS — N390 Urinary tract infection, site not specified: Secondary | ICD-10-CM | POA: Diagnosis not present

## 2024-07-04 DIAGNOSIS — R2681 Unsteadiness on feet: Secondary | ICD-10-CM | POA: Diagnosis not present

## 2024-07-10 DIAGNOSIS — R2681 Unsteadiness on feet: Secondary | ICD-10-CM | POA: Diagnosis not present

## 2024-07-10 DIAGNOSIS — G9341 Metabolic encephalopathy: Secondary | ICD-10-CM | POA: Diagnosis not present

## 2024-07-10 DIAGNOSIS — R41841 Cognitive communication deficit: Secondary | ICD-10-CM | POA: Diagnosis not present

## 2024-07-10 DIAGNOSIS — R2689 Other abnormalities of gait and mobility: Secondary | ICD-10-CM | POA: Diagnosis not present

## 2024-07-10 DIAGNOSIS — K5732 Diverticulitis of large intestine without perforation or abscess without bleeding: Secondary | ICD-10-CM | POA: Diagnosis not present

## 2024-07-10 DIAGNOSIS — D649 Anemia, unspecified: Secondary | ICD-10-CM | POA: Diagnosis not present

## 2024-07-12 DIAGNOSIS — N183 Chronic kidney disease, stage 3 unspecified: Secondary | ICD-10-CM | POA: Diagnosis not present

## 2024-07-12 DIAGNOSIS — G9341 Metabolic encephalopathy: Secondary | ICD-10-CM | POA: Diagnosis not present

## 2024-07-12 DIAGNOSIS — R41841 Cognitive communication deficit: Secondary | ICD-10-CM | POA: Diagnosis not present

## 2024-07-12 DIAGNOSIS — G3183 Dementia with Lewy bodies: Secondary | ICD-10-CM | POA: Diagnosis not present

## 2024-07-12 DIAGNOSIS — R630 Anorexia: Secondary | ICD-10-CM | POA: Diagnosis not present

## 2024-07-12 DIAGNOSIS — R2681 Unsteadiness on feet: Secondary | ICD-10-CM | POA: Diagnosis not present

## 2024-07-12 DIAGNOSIS — F0283 Dementia in other diseases classified elsewhere, unspecified severity, with mood disturbance: Secondary | ICD-10-CM | POA: Diagnosis not present

## 2024-07-12 DIAGNOSIS — N179 Acute kidney failure, unspecified: Secondary | ICD-10-CM | POA: Diagnosis not present

## 2024-07-12 DIAGNOSIS — R2689 Other abnormalities of gait and mobility: Secondary | ICD-10-CM | POA: Diagnosis not present

## 2024-07-13 ENCOUNTER — Emergency Department (HOSPITAL_COMMUNITY)

## 2024-07-13 ENCOUNTER — Encounter (HOSPITAL_COMMUNITY): Payer: Self-pay

## 2024-07-13 ENCOUNTER — Inpatient Hospital Stay (HOSPITAL_COMMUNITY)
Admission: EM | Admit: 2024-07-13 | Discharge: 2024-07-16 | DRG: 689 | Disposition: A | Discharge status: Skilled Nursing Facility | Source: Skilled Nursing Facility | Attending: Internal Medicine | Admitting: Internal Medicine

## 2024-07-13 DIAGNOSIS — K219 Gastro-esophageal reflux disease without esophagitis: Secondary | ICD-10-CM | POA: Diagnosis present

## 2024-07-13 DIAGNOSIS — K5730 Diverticulosis of large intestine without perforation or abscess without bleeding: Secondary | ICD-10-CM | POA: Diagnosis not present

## 2024-07-13 DIAGNOSIS — R404 Transient alteration of awareness: Secondary | ICD-10-CM | POA: Diagnosis not present

## 2024-07-13 DIAGNOSIS — I10 Essential (primary) hypertension: Secondary | ICD-10-CM | POA: Diagnosis not present

## 2024-07-13 DIAGNOSIS — N183 Chronic kidney disease, stage 3 unspecified: Secondary | ICD-10-CM | POA: Diagnosis present

## 2024-07-13 DIAGNOSIS — N184 Chronic kidney disease, stage 4 (severe): Secondary | ICD-10-CM | POA: Diagnosis not present

## 2024-07-13 DIAGNOSIS — Z8249 Family history of ischemic heart disease and other diseases of the circulatory system: Secondary | ICD-10-CM | POA: Diagnosis not present

## 2024-07-13 DIAGNOSIS — B9689 Other specified bacterial agents as the cause of diseases classified elsewhere: Secondary | ICD-10-CM | POA: Diagnosis present

## 2024-07-13 DIAGNOSIS — K5732 Diverticulitis of large intestine without perforation or abscess without bleeding: Secondary | ICD-10-CM | POA: Diagnosis present

## 2024-07-13 DIAGNOSIS — Z681 Body mass index (BMI) 19 or less, adult: Secondary | ICD-10-CM | POA: Diagnosis not present

## 2024-07-13 DIAGNOSIS — D631 Anemia in chronic kidney disease: Secondary | ICD-10-CM | POA: Diagnosis present

## 2024-07-13 DIAGNOSIS — I700 Atherosclerosis of aorta: Secondary | ICD-10-CM | POA: Diagnosis not present

## 2024-07-13 DIAGNOSIS — R443 Hallucinations, unspecified: Secondary | ICD-10-CM | POA: Diagnosis present

## 2024-07-13 DIAGNOSIS — N1831 Chronic kidney disease, stage 3a: Secondary | ICD-10-CM | POA: Diagnosis present

## 2024-07-13 DIAGNOSIS — K5792 Diverticulitis of intestine, part unspecified, without perforation or abscess without bleeding: Secondary | ICD-10-CM | POA: Diagnosis present

## 2024-07-13 DIAGNOSIS — E43 Unspecified severe protein-calorie malnutrition: Secondary | ICD-10-CM | POA: Insufficient documentation

## 2024-07-13 DIAGNOSIS — E039 Hypothyroidism, unspecified: Secondary | ICD-10-CM | POA: Diagnosis present

## 2024-07-13 DIAGNOSIS — F02C18 Dementia in other diseases classified elsewhere, severe, with other behavioral disturbance: Secondary | ICD-10-CM | POA: Diagnosis present

## 2024-07-13 DIAGNOSIS — F0280 Dementia in other diseases classified elsewhere without behavioral disturbance: Secondary | ICD-10-CM | POA: Diagnosis present

## 2024-07-13 DIAGNOSIS — L89152 Pressure ulcer of sacral region, stage 2: Secondary | ICD-10-CM | POA: Diagnosis not present

## 2024-07-13 DIAGNOSIS — R4020 Unspecified coma: Secondary | ICD-10-CM | POA: Diagnosis not present

## 2024-07-13 DIAGNOSIS — G3183 Dementia with Lewy bodies: Secondary | ICD-10-CM | POA: Diagnosis present

## 2024-07-13 DIAGNOSIS — F0390 Unspecified dementia without behavioral disturbance: Secondary | ICD-10-CM | POA: Diagnosis present

## 2024-07-13 DIAGNOSIS — I129 Hypertensive chronic kidney disease with stage 1 through stage 4 chronic kidney disease, or unspecified chronic kidney disease: Secondary | ICD-10-CM | POA: Diagnosis present

## 2024-07-13 DIAGNOSIS — R4182 Altered mental status, unspecified: Secondary | ICD-10-CM | POA: Diagnosis present

## 2024-07-13 DIAGNOSIS — F02C2 Dementia in other diseases classified elsewhere, severe, with psychotic disturbance: Secondary | ICD-10-CM | POA: Diagnosis present

## 2024-07-13 DIAGNOSIS — N179 Acute kidney failure, unspecified: Secondary | ICD-10-CM | POA: Diagnosis not present

## 2024-07-13 DIAGNOSIS — D638 Anemia in other chronic diseases classified elsewhere: Secondary | ICD-10-CM | POA: Diagnosis present

## 2024-07-13 DIAGNOSIS — Z043 Encounter for examination and observation following other accident: Secondary | ICD-10-CM | POA: Diagnosis not present

## 2024-07-13 DIAGNOSIS — Z79899 Other long term (current) drug therapy: Secondary | ICD-10-CM | POA: Diagnosis not present

## 2024-07-13 DIAGNOSIS — Z4789 Encounter for other orthopedic aftercare: Secondary | ICD-10-CM | POA: Diagnosis not present

## 2024-07-13 DIAGNOSIS — N3000 Acute cystitis without hematuria: Secondary | ICD-10-CM | POA: Diagnosis present

## 2024-07-13 DIAGNOSIS — Z8639 Personal history of other endocrine, nutritional and metabolic disease: Secondary | ICD-10-CM | POA: Diagnosis not present

## 2024-07-13 DIAGNOSIS — A419 Sepsis, unspecified organism: Secondary | ICD-10-CM | POA: Insufficient documentation

## 2024-07-13 DIAGNOSIS — G9341 Metabolic encephalopathy: Secondary | ICD-10-CM | POA: Diagnosis present

## 2024-07-13 DIAGNOSIS — E875 Hyperkalemia: Secondary | ICD-10-CM | POA: Diagnosis present

## 2024-07-13 DIAGNOSIS — N3090 Cystitis, unspecified without hematuria: Secondary | ICD-10-CM | POA: Diagnosis present

## 2024-07-13 DIAGNOSIS — B952 Enterococcus as the cause of diseases classified elsewhere: Secondary | ICD-10-CM | POA: Diagnosis present

## 2024-07-13 DIAGNOSIS — Z79890 Hormone replacement therapy (postmenopausal): Secondary | ICD-10-CM | POA: Diagnosis not present

## 2024-07-13 DIAGNOSIS — Z66 Do not resuscitate: Secondary | ICD-10-CM | POA: Diagnosis present

## 2024-07-13 DIAGNOSIS — E860 Dehydration: Secondary | ICD-10-CM | POA: Diagnosis not present

## 2024-07-13 DIAGNOSIS — D509 Iron deficiency anemia, unspecified: Secondary | ICD-10-CM | POA: Diagnosis present

## 2024-07-13 LAB — URINALYSIS, W/ REFLEX TO CULTURE (INFECTION SUSPECTED)
Bilirubin Urine: NEGATIVE
Glucose, UA: NEGATIVE mg/dL
Ketones, ur: NEGATIVE mg/dL
Nitrite: NEGATIVE
Protein, ur: 30 mg/dL — AB
Specific Gravity, Urine: 1.013 (ref 1.005–1.030)
WBC, UA: >50 WBC/hpf (ref 0–5)
pH: 5 (ref 5.0–8.0)

## 2024-07-13 LAB — I-STAT CHEM 8, ED
BUN: 30 mg/dL — ABNORMAL HIGH (ref 8–23)
BUN: 27 mg/dL — ABNORMAL HIGH (ref 8–23)
Calcium, Ion: 1.28 mmol/L (ref 1.15–1.40)
Calcium, Ion: 1.18 mmol/L (ref 1.15–1.40)
Chloride: 112 mmol/L — ABNORMAL HIGH (ref 98–111)
Chloride: 116 mmol/L — ABNORMAL HIGH (ref 98–111)
Creatinine, Ser: 2 mg/dL — ABNORMAL HIGH (ref 0.44–1.00)
Creatinine, Ser: 1.8 mg/dL — ABNORMAL HIGH (ref 0.44–1.00)
Glucose, Bld: 96 mg/dL (ref 70–99)
Glucose, Bld: 86 mg/dL (ref 70–99)
HCT: 24 % — ABNORMAL LOW (ref 36.0–46.0)
HCT: 21 % — ABNORMAL LOW (ref 36.0–46.0)
Hemoglobin: 8.2 g/dL — ABNORMAL LOW (ref 12.0–15.0)
Hemoglobin: 7.1 g/dL — ABNORMAL LOW (ref 12.0–15.0)
Potassium: 5.3 mmol/L — ABNORMAL HIGH (ref 3.5–5.1)
Potassium: 5.2 mmol/L — ABNORMAL HIGH (ref 3.5–5.1)
Sodium: 139 mmol/L (ref 135–145)
Sodium: 140 mmol/L (ref 135–145)
TCO2: 16 mmol/L — ABNORMAL LOW (ref 22–32)
TCO2: 15 mmol/L — ABNORMAL LOW (ref 22–32)

## 2024-07-13 LAB — I-STAT CG4 LACTIC ACID, ED
Lactic Acid, Venous: 0.4 mmol/L — ABNORMAL LOW (ref 0.5–1.9)
Lactic Acid, Venous: <0.3 mmol/L — ABNORMAL LOW (ref 0.5–1.9)

## 2024-07-13 LAB — COMPREHENSIVE METABOLIC PANEL WITH GFR
ALT: 15 U/L (ref 0–44)
AST: 15 U/L (ref 15–41)
Albumin: 3.4 g/dL — ABNORMAL LOW (ref 3.5–5.0)
Alkaline Phosphatase: 74 U/L (ref 38–126)
Anion gap: 10 (ref 5–15)
BUN: 31 mg/dL — ABNORMAL HIGH (ref 8–23)
CO2: 16 mmol/L — ABNORMAL LOW (ref 22–32)
Calcium: 8.3 mg/dL — ABNORMAL LOW (ref 8.9–10.3)
Chloride: 111 mmol/L (ref 98–111)
Creatinine, Ser: 1.79 mg/dL — ABNORMAL HIGH (ref 0.44–1.00)
GFR, Estimated: 26 mL/min — ABNORMAL LOW (ref >60)
Glucose, Bld: 97 mg/dL (ref 70–99)
Potassium: 5.3 mmol/L — ABNORMAL HIGH (ref 3.5–5.1)
Sodium: 136 mmol/L (ref 135–145)
Total Bilirubin: 0.2 mg/dL (ref 0.0–1.2)
Total Protein: 6.1 g/dL — ABNORMAL LOW (ref 6.5–8.1)

## 2024-07-13 LAB — CBC WITH DIFFERENTIAL/PLATELET
Abs Immature Granulocytes: 0.04 K/uL (ref 0.00–0.07)
Basophils Absolute: 0.1 K/uL (ref 0.0–0.1)
Basophils Relative: 1 %
Eosinophils Absolute: 0.2 K/uL (ref 0.0–0.5)
Eosinophils Relative: 2 %
HCT: 22.4 % — ABNORMAL LOW (ref 36.0–46.0)
Hemoglobin: 7 g/dL — ABNORMAL LOW (ref 12.0–15.0)
Immature Granulocytes: 0 %
Lymphocytes Relative: 8 %
Lymphs Abs: 0.7 K/uL (ref 0.7–4.0)
MCH: 33 pg (ref 26.0–34.0)
MCHC: 31.3 g/dL (ref 30.0–36.0)
MCV: 105.7 fL — ABNORMAL HIGH (ref 80.0–100.0)
Monocytes Absolute: 0.6 K/uL (ref 0.1–1.0)
Monocytes Relative: 7 %
Neutro Abs: 7.3 K/uL (ref 1.7–7.7)
Neutrophils Relative %: 82 %
Platelets: 302 K/uL (ref 150–400)
RBC: 2.12 MIL/uL — ABNORMAL LOW (ref 3.87–5.11)
RDW: 14.5 % (ref 11.5–15.5)
WBC: 8.9 K/uL (ref 4.0–10.5)
nRBC: 0 % (ref 0.0–0.2)

## 2024-07-13 LAB — URINE DRUG SCREEN
Amphetamines: NEGATIVE
Barbiturates: NEGATIVE
Benzodiazepines: NEGATIVE
Cocaine: NEGATIVE
Fentanyl: NEGATIVE
Methadone Scn, Ur: NEGATIVE
Opiates: NEGATIVE
Tetrahydrocannabinol: NEGATIVE

## 2024-07-13 LAB — PRO BRAIN NATRIURETIC PEPTIDE: Pro Brain Natriuretic Peptide: 460 pg/mL — ABNORMAL HIGH (ref <300.0)

## 2024-07-13 LAB — TROPONIN T, HIGH SENSITIVITY
Troponin T High Sensitivity: 20 ng/L — ABNORMAL HIGH (ref 0–19)
Troponin T High Sensitivity: 20 ng/L — ABNORMAL HIGH (ref 0–19)

## 2024-07-13 MED ORDER — LEVOTHYROXINE SODIUM 50 MCG PO TABS
175.0000 ug | ORAL_TABLET | Freq: Every day | ORAL | Status: DC
  Administered 2024-07-14 – 2024-07-16 (×3): 175 ug via ORAL
  Filled 2024-07-13 (×4): qty 1

## 2024-07-13 MED ORDER — SENNOSIDES-DOCUSATE SODIUM 8.6-50 MG PO TABS
2.0000 | ORAL_TABLET | Freq: Every day | ORAL | Status: DC
  Administered 2024-07-14 – 2024-07-15 (×2): 2 via ORAL
  Filled 2024-07-13 (×3): qty 2

## 2024-07-13 MED ORDER — HYDRALAZINE HCL 25 MG PO TABS: 50.0000 mg | ORAL_TABLET | Freq: Two times a day (BID) | ORAL | Status: DC

## 2024-07-13 MED ORDER — SODIUM CHLORIDE 0.9 % IV BOLUS
500.0000 mL | Freq: Once | INTRAVENOUS | Status: AC
  Administered 2024-07-13: 500 mL via INTRAVENOUS

## 2024-07-13 MED ORDER — SODIUM CHLORIDE 0.9 % IV SOLN
2.0000 g | INTRAVENOUS | Status: DC
  Administered 2024-07-14 – 2024-07-15 (×2): 2 g via INTRAVENOUS
  Filled 2024-07-13 (×2): qty 20

## 2024-07-13 MED ORDER — SODIUM CHLORIDE 0.9 % IV SOLN: 125.0000 mg | Freq: Once | INTRAVENOUS | Status: DC

## 2024-07-13 MED ORDER — ONDANSETRON HCL 4 MG/2ML IJ SOLN: 4.0000 mg | Freq: Four times a day (QID) | INTRAMUSCULAR | Status: DC | PRN

## 2024-07-13 MED ORDER — PROCHLORPERAZINE EDISYLATE 10 MG/2ML IJ SOLN: 10.0000 mg | INTRAMUSCULAR | Status: DC | PRN

## 2024-07-13 MED ORDER — METRONIDAZOLE 500 MG/100ML IV SOLN
500.0000 mg | Freq: Two times a day (BID) | INTRAVENOUS | Status: DC
  Administered 2024-07-14 – 2024-07-15 (×4): 500 mg via INTRAVENOUS
  Filled 2024-07-13 (×4): qty 100

## 2024-07-13 MED ORDER — ACETAMINOPHEN 650 MG RE SUPP: 650.0000 mg | Freq: Four times a day (QID) | RECTAL | Status: DC | PRN

## 2024-07-13 MED ORDER — SODIUM CHLORIDE 0.9 % IV SOLN
1.0000 g | Freq: Once | INTRAVENOUS | Status: AC
  Administered 2024-07-13: 1 g via INTRAVENOUS
  Filled 2024-07-13: qty 10

## 2024-07-13 MED ORDER — ACETAMINOPHEN 325 MG PO TABS
650.0000 mg | ORAL_TABLET | Freq: Four times a day (QID) | ORAL | Status: DC | PRN
  Administered 2024-07-14: 650 mg via ORAL
  Filled 2024-07-13: qty 2

## 2024-07-13 MED ORDER — ONDANSETRON HCL 4 MG PO TABS: 4.0000 mg | ORAL_TABLET | Freq: Four times a day (QID) | ORAL | Status: DC | PRN

## 2024-07-13 NOTE — H&P (Addendum)
 History and Physical    [name] FMW:[id number] DOB: January 09, 1929 DOA: 07/13/2024  PCP: Marston Junette HERO, MD Patient coming from: ALF/ILF  Chief Complaint: unresponsive episode   HPI: [name] is a 89 y.o. female with medical history significant of subarachnoid hemorrhage status post fall, Lewy body dementia, hypothyroidism, hypertension, CKD stage IIIa, anemia, chronic fatigue who had an unresponsive episode at the nursing facility and was subsequently sent to hospital for further evaluation.   According to the patient's son, patient was recently hospitalized for acute diverticulitis and was treated with IV antibiotics subsequently transition to p.o. antibiotics.  Patient was discharged back to [facility] which is her assisted living facility.  During the hospitalization patient was noted to have low hemoglobin at 6.7.  Repeat without transfusion demonstrated hemoglobin of 7 and therefore patient was not emergently transfused.  There was no localized source of bleeding at the time. She also underwent evaluation for macrocytic anemia. B12, folate and iron panel were not revealing at that time. Patient was subsequently discharged back to the facility. He reports that she has been declining for a period of time and that in spite of him taking ensure drinks. She has severe lewy body dementia that appears to be progressing as well.    ED Course:  In the ER, BP 125/66, HR 74, RR 14, O2 saturation 96% on RA,  and Tmax 99.1. Cbc demonstrated wbc,  hb/hct, and platelet. Chemistry demonstrated Na 140, K 5.2, Cl 116, bicarb, Bun/Cr 27/1.80  and glucose 86. Initial troponin was 20 and repeat was 20.  CXR demonstrated no acute cardiopulmonary findings.  Pelvic x-ray demonstrated no acute fracture or dislocation.  Degenerative changes of the pubic symphysis and sacroiliac joints.  Tibia-fibula x-ray demonstrated no acute fracture of the left lower leg.  Plate and screw fixation of the proximal tibia.   Urinalysis demonstrated few bacteria with greater than 50 white blood cells large leukocyte esterase.  CT chest abdomen and pelvis demonstrated mild cardiomegaly with trace pericardial effusion.  No evidence of pneumonia on pulmonary edema or pulmonary effusion.  Total colonic diverticulosis.  Septal inflammatory stranding about the distal descending and sigmoid colon, which may reflect changes of an infectious or inflammatory colitis and/or acute diverticulitis.  No peridiverticular abscess or pneumo peritoneum. EKG: artifact but appears to be NSR with prolonged Qtc and short pr interval   Review of Systems:  All systems reviewed and apart from history of presenting illness, are negative.  Past Medical History:  Diagnosis Date   Anemia    Chronic fatigue    Chronic renal insufficiency, stage III (moderate)    Depression    HYPERTENSION 04/04/2007   HYPOTHYROIDISM 04/04/2007   Lewy body dementia (HCC)    Macular degeneration     Past Surgical History:  Procedure Laterality Date   ABDOMINAL HYSTERECTOMY     APPENDECTOMY     BACK SURGERY  2012   Cataracts     CHOLECYSTECTOMY     ERCP N/A 11/22/2014   Procedure: ENDOSCOPIC RETROGRADE CHOLANGIOPANCREATOGRAPHY (ERCP);  Surgeon: Oliva Boots, MD;  Location: [facility] ENDOSCOPY;  Service: Endoscopy;  Laterality: N/A;   HERNIA REPAIR     OOPHORECTOMY     ORIF TIBIA FRACTURE  2012   dr harden   [facility] SURGERY  2011   cervical     reports that she quit smoking about 54 years ago. Her smoking use included cigarettes. She started smoking about 61 years ago. She has a 7 pack-year smoking  history. She has never used smokeless tobacco. She reports that she does not drink alcohol  and does not use drugs.  Allergies  Allergen Reactions   Lactose Intolerance (Gi) Other (See Comments)    Reaction not stated on MAR   Oxycodone Hcl Itching    Family History  Problem Relation Age of Onset   Kidney cancer Mother 67       renal cell CA   Heart disease Father     Lymphoma Son    Breast cancer Daughter     Prior to Admission medications   Medication Sig Start Date End Date Taking? Authorizing Provider  amLODipine  (NORVASC ) 10 MG tablet TAKE 1 TABLET BY MOUTH DAILY 06/23/22   Nafziger, Darleene, NP  Brexpiprazole  (REXULTI ) 0.5 MG TABS TAKE 1 TABLET BY MOUTH AT NIGHT Patient taking differently: Take 0.5 mg by mouth at bedtime. 05/11/23   Georjean Darice HERO, MD  hydrALAZINE  (APRESOLINE ) 50 MG tablet Take 50 mg by mouth 2 (two) times daily. hold for a Systolic reading less than 120    [provider]  isosorbide  mononitrate (ISMO ) 10 MG tablet Take 20 mg by mouth 2 (two) times daily. Hold for SBP<110    [provider]  levothyroxine  (SYNTHROID ) 175 MCG tablet Take 175 mcg by mouth daily before breakfast.    [provider]  memantine  (NAMENDA ) 10 MG tablet TAKE 1 TABLET BY MOUTH IN THE  MORNING AND AT BEDTIME 12/07/22   Georjean Darice HERO, MD  mirtazapine (REMERON) 15 MG tablet Take 15 mg by mouth at bedtime.    [provider]  Multiple Vitamin (MULTIVITAMIN) tablet Take 1 tablet by mouth See admin instructions. Take 1 tablet by mouth once a day with food 07/01/24   Rizwan, Saima, MD  Multiple Vitamins-Iron (MULTIVITAMINS WITH IRON) TABS tablet Take 1 tablet by mouth daily.    [provider]  Multiple Vitamins-Minerals (PRESERVISION AREDS) TABS Take 1 tablet by mouth daily with lunch.    [provider]  olmesartan  (BENICAR ) 40 MG tablet Take 40 mg by mouth daily. Hold for SBP<110    [provider]  pantoprazole  (PROTONIX ) 40 MG tablet Take 40 mg by mouth at bedtime. 05/16/23   [provider]  polyethylene glycol (MIRALAX  / GLYCOLAX ) 17 g packet Take 17 g by mouth daily as needed for mild constipation. Patient taking differently: Take 17 g by mouth daily. 12/11/22   Hongalgi, Anand D, MD  QUEtiapine  (SEROQUEL ) 50 MG tablet TAKE 1 TABLET BY MOUTH TWICE  DAILY Patient taking differently: Take  50-75 mg by mouth See admin instructions. Take 50 mg by mouth once a day and 75 mg at bedtime 02/01/23   Georjean Darice HERO, MD  sertraline  (ZOLOFT ) 100 MG tablet Take 1 tablet (100 mg total) by mouth in the morning and at bedtime. 12/11/22   Hongalgi, Anand D, MD  TYLENOL  500 MG tablet Take 1,000 mg by mouth in the morning and at bedtime.    [provider]  Vitamin D , Ergocalciferol , (DRISDOL) 1.25 MG (50000 UNIT) CAPS capsule Take 50,000 Units by mouth every 7 (seven) days. Takes on Fridays    [provider]    Physical Exam: Vitals:   07/13/24 1945 07/13/24 2043 07/13/24 2200 07/13/24 2209  BP: (!) 140/116  (!) 141/45   Pulse: 69  75   Resp: 13  16   Temp:  99.1 F (37.3 C)  98 F (36.7 C)  TempSrc:  Oral  Oral  SpO2: 98%  97%     Physical Exam Constitutional:      General: He is not in acute distress.    Appearance: Normal appearance.  HENT:     Head: Normocephalic and atraumatic.  Eyes:     Extraocular Movements: Extraocular movements intact.     Conjunctiva/sclera: Conjunctivae normal.     Pupils: Pupils are equal, round, and reactive to light.  Cardiovascular:     Rate and Rhythm: Normal rate and regular rhythm.     Pulses: Normal pulses.     Heart sounds: Normal heart sounds.  Pulmonary:     Effort: Pulmonary effort is normal. No respiratory distress.     Breath sounds: Normal breath sounds. No wheezing, rhonchi or rales.  Abdominal:     General: Abdomen is flat. Bowel sounds are normal. There is no distension.     Palpations: Abdomen is soft.     Tenderness: There is no abdominal tenderness.  Musculoskeletal:        General: No deformity. Normal range of motion.  Skin:    General: Skin is warm and dry.     Coloration: Skin is not jaundiced.  Neurological:     General: No focal deficit present.     Mental Status: She is alert and oriented to self. Mental status is at baseline.   Labs on Admission: I have personally reviewed following labs and  imaging studies  CBC: Recent Labs  Lab 07/13/24 1430 07/13/24 1453 07/13/24 2216  WBC 8.9  --   --   NEUTROABS 7.3  --   --   HGB 7.0* 8.2* 7.1*  HCT 22.4* 24.0* 21.0*  MCV 105.7*  --   --   PLT 302  --   --    Basic Metabolic Panel: Recent Labs  Lab 07/13/24 1430 07/13/24 1453 07/13/24 2216  NA 136 139 140  K 5.3* 5.3* 5.2*  CL 111 112* 116*  CO2 16*  --   --   GLUCOSE 97 96 86  BUN 31* 30* 27*  CREATININE 1.79* 2.00* 1.80*  CALCIUM  8.3*  --   --    GFR: CrCl cannot be calculated (Unknown ideal weight.). Liver Function Tests: Recent Labs  Lab 07/13/24 1430  AST 15  ALT 15  ALKPHOS 74  BILITOT 0.2  PROT 6.1*  ALBUMIN 3.4*   No results for input(s): LIPASE, AMYLASE in the last 168 hours. No results for input(s): AMMONIA in the last 168 hours. Coagulation Profile: No results for input(s): INR, PROTIME in the last 168 hours. Cardiac Enzymes: No results for input(s): CKTOTAL, CKMB, CKMBINDEX, TROPONINI in the last 168 hours. BNP (last 3 results) Recent Labs    06/28/24 1850 07/13/24 1430  PROBNP 454.0* 460.0*   HbA1C: No results for input(s): HGBA1C in the last 72 hours. CBG: No results for input(s): GLUCAP in the last 168 hours. Lipid Profile: No results for input(s): CHOL, HDL, LDLCALC, TRIG, CHOLHDL, LDLDIRECT in the last 72 hours. Thyroid  Function Tests: No results for input(s): TSH, T4TOTAL, FREET4, T3FREE, THYROIDAB in the last 72 hours. Anemia Panel: No results for input(s): VITAMINB12, FOLATE, FERRITIN, TIBC, IRON, RETICCTPCT in the last 72 hours. Urine analysis:    Component Value Date/Time   COLORURINE YELLOW 07/13/2024 2042   APPEARANCEUR HAZY (A) 07/13/2024 2042   LABSPEC 1.013 07/13/2024 2042   PHURINE 5.0 07/13/2024 2042   GLUCOSEU NEGATIVE 07/13/2024 2042   GLUCOSEU NEGATIVE 07/08/2021 1146   HGBUR SMALL (A) 07/13/2024 2042   BILIRUBINUR NEGATIVE 07/13/2024 2042  BILIRUBINUR neg 09/22/2022 1648   KETONESUR NEGATIVE 07/13/2024 2042   PROTEINUR 30 (A) 07/13/2024 2042   UROBILINOGEN 0.2 09/22/2022 1648   UROBILINOGEN 0.2 07/08/2021 1146   NITRITE NEGATIVE 07/13/2024 2042   LEUKOCYTESUR LARGE (A) 07/13/2024 2042    Radiological Exams on Admission: CT Head Wo Contrast Result Date: 07/13/2024 CLINICAL DATA:  Altered mental status.  Found unconscious. EXAM: CT HEAD WITHOUT CONTRAST CT CERVICAL SPINE WITHOUT CONTRAST TECHNIQUE: Multidetector CT imaging of the head and cervical spine was performed following the standard protocol without intravenous contrast. Multiplanar CT image reconstructions of the cervical spine were also generated. RADIATION DOSE REDUCTION: This exam was performed according to the departmental dose-optimization program which includes automated exposure control, adjustment of the mA and/or kV according to patient size and/or use of iterative reconstruction technique. COMPARISON:  CT head/cervical spine 01/13/2024 FINDINGS: CT HEAD FINDINGS Brain: Ventricles, cisterns and other CSF spaces are within normal with mild age related atrophic change present. There is mild chronic ischemic microvascular disease. No mass, mass effect, shift of midline structures or acute hemorrhage. No evidence of acute infarction. Vascular: No hyperdense vessel or unexpected calcification. Skull: Normal. Negative for fracture or focal lesion. Sinuses/Orbits: No acute finding. Other: None. CT CERVICAL SPINE FINDINGS Alignment: Normal. Skull base and vertebrae: Vertebral body heights are maintained. There is mild to moderate spondylosis of the cervical spine to include uncovertebral joint spurring and facet arthropathy. Anterior fusion hardware at the C4-5 level is intact and unchanged. Anterior fusion hardware at the C6-7 level is unchanged and demonstrates continued evidence fracture of the right C7 vertebral body screw. No acute fracture. Moderate bilateral neural foraminal  narrowing at the C3-4 level. Moderate right-sided neural foraminal narrowing at the C5-6 level Soft tissues and spinal canal: No prevertebral fluid or swelling. No visible canal hematoma. Disc levels: Interbody fusion at the C4-5 and C6-7 levels as the remaining disc spaces are unremarkable. Upper chest: No acute findings. Other: None. IMPRESSION: 1. No acute intracranial findings. 2. Age related atrophic change and chronic ischemic microvascular disease. 3. No acute cervical spine injury. 4. Moderate spondylosis of the cervical spine with multilevel neural foraminal narrowing as described. 5. Anterior fusion hardware at the C4-5 and C6-7 levels. Chronic stable fracture of the right C7 screw. Electronically Signed   By: Toribio Agreste M.D.   On: 07/13/2024 16:40   CT Cervical Spine Wo Contrast Result Date: 07/13/2024 CLINICAL DATA:  Altered mental status.  Found unconscious. EXAM: CT HEAD WITHOUT CONTRAST CT CERVICAL SPINE WITHOUT CONTRAST TECHNIQUE: Multidetector CT imaging of the head and cervical spine was performed following the standard protocol without intravenous contrast. Multiplanar CT image reconstructions of the cervical spine were also generated. RADIATION DOSE REDUCTION: This exam was performed according to the departmental dose-optimization program which includes automated exposure control, adjustment of the mA and/or kV according to patient size and/or use of iterative reconstruction technique. COMPARISON:  CT head/cervical spine 01/13/2024 FINDINGS: CT HEAD FINDINGS Brain: Ventricles, cisterns and other CSF spaces are within normal with mild age related atrophic change present. There is mild chronic ischemic microvascular disease. No mass, mass effect, shift of midline structures or acute hemorrhage. No evidence of acute infarction. Vascular: No hyperdense vessel or unexpected calcification. Skull: Normal. Negative for fracture or focal lesion. Sinuses/Orbits: No acute finding. Other: None. CT  CERVICAL SPINE FINDINGS Alignment: Normal. Skull base and vertebrae: Vertebral body heights are maintained. There is mild to moderate spondylosis of the cervical spine to include uncovertebral joint spurring and facet  arthropathy. Anterior fusion hardware at the C4-5 level is intact and unchanged. Anterior fusion hardware at the C6-7 level is unchanged and demonstrates continued evidence fracture of the right C7 vertebral body screw. No acute fracture. Moderate bilateral neural foraminal narrowing at the C3-4 level. Moderate right-sided neural foraminal narrowing at the C5-6 level Soft tissues and spinal canal: No prevertebral fluid or swelling. No visible canal hematoma. Disc levels: Interbody fusion at the C4-5 and C6-7 levels as the remaining disc spaces are unremarkable. Upper chest: No acute findings. Other: None. IMPRESSION: 1. No acute intracranial findings. 2. Age related atrophic change and chronic ischemic microvascular disease. 3. No acute cervical spine injury. 4. Moderate spondylosis of the cervical spine with multilevel neural foraminal narrowing as described. 5. Anterior fusion hardware at the C4-5 and C6-7 levels. Chronic stable fracture of the right C7 screw. Electronically Signed   By: Toribio Agreste M.D.   On: 07/13/2024 16:40   CT CHEST ABDOMEN PELVIS WO CONTRAST Result Date: 07/13/2024 CLINICAL DATA:  Sepsis, altered mental status. EXAM: CT CHEST, ABDOMEN AND PELVIS WITHOUT CONTRAST TECHNIQUE: Multidetector CT imaging of the chest, abdomen and pelvis was performed following the standard protocol without IV contrast. RADIATION DOSE REDUCTION: This exam was performed according to the departmental dose-optimization program which includes automated exposure control, adjustment of the mA and/or kV according to patient size and/or use of iterative reconstruction technique. COMPARISON:  June 28, 2024, Dec 09, 2022 FINDINGS: Of note, the lack of intravenous contrast limits evaluation of the solid  organ parenchyma and vascularity. CT CHEST FINDINGS Cardiovascular: Mild cardiomegaly. Trace pericardial effusion. Dense multi-vessel coronary atherosclerosis.No aortic aneurysm. Diffuse calcified aortic atherosclerosis. Mediastinum/Nodes: No mediastinal mass.No mediastinal, hilar, or axillary lymphadenopathy. Lungs/Pleura: The midline trachea and bronchi are patent. Biapical pleuroparenchymal scarring. No focal airspace consolidation, pleural effusion, or pneumothorax. CT ABDOMEN PELVIS FINDINGS Hepatobiliary: No mass.Cholecystectomy. Moderate dilation of the intrahepatic and extrahepatic bile ducts, likely related to the prior cholecystectomy. Pancreas: No mass or main ductal dilation. No peripancreatic inflammation or fluid collection. Spleen: Normal size. No mass. Adrenals/Urinary Tract: No adrenal masses. No renal mass. No hydronephrosis or nephrolithiasis. The urinary bladder is distended without focal abnormality. Stomach/Bowel: The stomach is decompressed without focal abnormality. Large bilobed periampullary duodenum diverticulum measuring 4.4 cm. No small bowel wall thickening or inflammation. No small bowel obstruction.Normal appendix. Total colonic diverticulosis. Subtle inflammatory stranding about the distal descending and sigmoid colon. No peridiverticular abscess. Vascular/Lymphatic: No aortic aneurysm. Diffuse aortoiliac atherosclerosis. No intraabdominal or pelvic lymphadenopathy. Reproductive: Hysterectomy. No concerning adnexal mass. No free pelvic fluid. Other: No pneumoperitoneum or ascites. Musculoskeletal: No acute fracture or destructive lesion. Osteopenia. Multilevel degenerative disc disease of the spine. Thoracic DISH. Anterior cervical fusion hardware. IMPRESSION: 1. Mild cardiomegaly with trace pericardial effusion. No pneumonia, pulmonary edema, or pleural effusion. 2. Total colonic diverticulosis. Subtle inflammatory stranding about the distal descending and sigmoid colon, which may  reflect changes of an infectious or inflammatory colitis or acute diverticulitis. No peridiverticular abscess or pneumoperitoneum. Aortic Atherosclerosis (ICD10-I70.0). Electronically Signed   By: Rogelia Myers M.D.   On: 07/13/2024 16:37   DG Tibia/Fibula Left Result Date: 07/13/2024 CLINICAL DATA:  Provided history of fall. EXAM: LEFT TIBIA AND FIBULA - 2 VIEW COMPARISON:  Knee radiograph 08/21/2023 FINDINGS: Plate and screw fixation of the proximal tibia. The hardware is intact. No periprosthetic lucency. No acute fracture of the lower leg. No knee or ankle dislocation. Peripheral vascular calcifications are seen. Mild soft tissue edema. IMPRESSION: 1. No acute fracture of  the left lower leg. 2. Plate and screw fixation of the proximal tibia. Hardware is intact. Electronically Signed   By: Andrea Gasman M.D.   On: 07/13/2024 15:32   DG Pelvis 1-2 Views Result Date: 07/13/2024 CLINICAL DATA:  Provided history of fall. Dementia patient with altered mental status. EXAM: PELVIS - 1-2 VIEW COMPARISON:  01/13/2024 FINDINGS: The patient is rotated limiting assessment. Allowing for this, no evidence of acute fracture. No hip dislocation. The bones are subjectively under mineralized. Degenerative changes of the pubic symphysis and sacroiliac joints without diastasis. IMPRESSION: 1. No evidence of acute fracture or dislocation. 2. Degenerative changes of the pubic symphysis and sacroiliac joints. Electronically Signed   By: Andrea Gasman M.D.   On: 07/13/2024 15:30   DG Chest Port 1 View Result Date: 07/13/2024 CLINICAL DATA:  Provided history of fall. Dementia patient with altered mental status. EXAM: PORTABLE CHEST 1 VIEW COMPARISON:  Radiograph 06/28/2024 FINDINGS: The cardiomediastinal contours are normal. Aortic atherosclerosis. Pulmonary vasculature is normal. No consolidation, pleural effusion, or pneumothorax. Chronic calcified granuloma in the right lung. No acute osseous abnormalities are seen.  IMPRESSION: No acute chest findings. Electronically Signed   By: Andrea Gasman M.D.   On: 07/13/2024 15:29    EKG: Independently reviewed.   Assessment/Plan Principal Problem:   Unresponsive episode Active Problems:   Dementia without behavioral disturbance (HCC)   Hypothyroidism   Anemia of chronic disease   CKD (chronic kidney disease) stage 3, GFR 30-59 ml/min (HCC)   GERD (gastroesophageal reflux disease)   Essential hypertension   Cystitis   Diverticulitis     Unresponsive episode/syncope EKG and troponins are flat at this time Patient was not having chest pain Will place on telemetry overnight Will treat electrolyte abnormalities Patient will be placed on normal saline It is possible this is secondary to orthostatics Son reports she has not been eating well   Anemia of chronic disease Patient had testing on previous admission Hb is currently 7 and baseline is generally 7-8 per documents Repeat hb while here was 8.2 and then decreased to 7.1 Appears that her iron level was 25 Will give IV iron transfusion  Dementia with behavioral disturbance This is severe Appears to be progressing where patient is not eating Son reports that he is doing everything to see to it that the patient eats  UTI Patient was given a dose of rocephin   GERD Will continue protonix    Essential hypertension Patient takes norvasc  10 mg daily Hydralazine  50 mg bid Imdur  20 mg BID Benicar  40 mg daily   Will hydrate patient and hold meds overnight  Ckd stage 3 Cr on admission was 1.79 Baseline is 1.55 Ut us  around 1.80 at this time Will continue iV hydration  Will continue to monitor    Hyperkalemia K is 5.2 Will continue to monitor Repeat potassium level and if more than 5.4 will treat   Diverticulitis  Patient was noted to have a prior admission Unclear whether she completed her oral outpatient regimen after discharge Will start on rocephin  and add flagyl   She was given  oral antibiotics for 3 more days Son states that she has not been eating weill add  Ct imaging demonstrated diverticulitis but it appears to be improving from the CT renal stone study and CT chest abd pelvis on this admission   Prolonged QTC Will avoid qtc prolonging medication     DVT prophylaxis:  scds Code Status: DNR Family Communication: son  Disposition Plan:  Patient class  is not currently inpatient or observation. Update patient class prior to completing documentation    Consults called: palliative care  Admission status: inpatient Level of care: Level of care: telemetry The medical decision making on this patient was of high complexity and the patient is at high risk for clinical deterioration, therefore this is a level 3 visit.  The medical decision making is of moderate complexity, therefore this is a level 2 visit.  Bradly MARLA Drones MD [facility]  If 7PM-7AM, please contact night-coverage [email or web address]  07/13/2024, 11:06 PM

## 2024-07-13 NOTE — ED Provider Notes (Signed)
 Patient care was taken over from Dr. Laurice.  In short patient is a 89 year old who presents after she had an episode of unresponsiveness at her nursing facility.  She has a history of dementia.  She had a recent admission for diverticulitis.  She was anemic during that hospitalization.  Today she is alert with stable vital signs but is a little more confused per her son.  She seems to be hallucinating more.  She is not febrile.  CT scan of her head does not show any acute abnormality.  CT of the chest abdomen pelvis shows some subtle inflammatory changes in the area where she was recently treated for diverticulitis but does not appear that she has any worsening symptoms or signs of an abscess or perforation.  Her urine does look concerning for infection.  She was given IV Rocephin .  It was sent for culture.  Had a long discussion with the son.  Given her episode earlier today with worsening confusion, we will plan admission for observation.  Discussed with the hospitalist who will admit the pt.   Lenor Hollering, MD 07/13/24 2229

## 2024-07-13 NOTE — ED Triage Notes (Signed)
 Pt BIB ems for altered mental status, was found at her dining area unconscious, woke up and was more altered than usual, hx of dementia. BP 109/41, HR 89, CBG 133, O2 98% RA, RR 18.

## 2024-07-13 NOTE — ED Provider Notes (Signed)
 [place] [facility] AT [facility] Provider Note   CSN: [id number] Arrival date & time: 07/13/24  1324     Patient presents with: Altered Mental Status and Loss of Consciousness   [name] is a 89 y.o. female.  {Add pertinent medical, surgical, social history, OB history to HPI:30103} 89 year old female with prior medical history as detailed below presents for evaluation.  Patient was transported from [facility].  She was found in her dining area minimally responsive.  When EMS evaluated the patient she was able to arouse herself and answer questions appropriately.  On evaluation here in the ED she is without specific acute complaint.  She cannot recall any syncope or other events from the hours before her arrival here in the ED.  Medical history significant of  hx of subarachnoid hemorrhage  s/p fall, Lewy body dementia, hypothyroidism, hypertension, CKDIIIa, anemia , chronic fatigue.   The history is provided by the patient and medical records.       Prior to Admission medications   Medication Sig Start Date End Date Taking? Authorizing Provider  amLODipine  (NORVASC ) 10 MG tablet TAKE 1 TABLET BY MOUTH DAILY 06/23/22   Nafziger, Darleene, NP  Brexpiprazole  (REXULTI ) 0.5 MG TABS TAKE 1 TABLET BY MOUTH AT NIGHT Patient taking differently: Take 0.5 mg by mouth at bedtime. 05/11/23   Georjean Darice HERO, MD  hydrALAZINE  (APRESOLINE ) 50 MG tablet Take 50 mg by mouth 2 (two) times daily. hold for a Systolic reading less than 120    [provider]  isosorbide  mononitrate (ISMO ) 10 MG tablet Take 20 mg by mouth 2 (two) times daily. Hold for SBP<110    [provider]  levothyroxine  (SYNTHROID ) 175 MCG tablet Take 175 mcg by mouth daily before breakfast.    [provider]  memantine  (NAMENDA ) 10 MG tablet TAKE 1 TABLET BY MOUTH IN THE  MORNING AND AT BEDTIME 12/07/22   Georjean Darice HERO, MD  mirtazapine (REMERON) 15 MG tablet Take 15 mg by  mouth at bedtime.    [provider]  Multiple Vitamin (MULTIVITAMIN) tablet Take 1 tablet by mouth See admin instructions. Take 1 tablet by mouth once a day with food 07/01/24   Rizwan, Saima, MD  Multiple Vitamins-Iron (MULTIVITAMINS WITH IRON) TABS tablet Take 1 tablet by mouth daily.    [provider]  Multiple Vitamins-Minerals (PRESERVISION AREDS) TABS Take 1 tablet by mouth daily with lunch.    [provider]  olmesartan  (BENICAR ) 40 MG tablet Take 40 mg by mouth daily. Hold for SBP<110    [provider]  pantoprazole  (PROTONIX ) 40 MG tablet Take 40 mg by mouth at bedtime. 05/16/23   [provider]  polyethylene glycol (MIRALAX  / GLYCOLAX ) 17 g packet Take 17 g by mouth daily as needed for mild constipation. Patient taking differently: Take 17 g by mouth daily. 12/11/22   Hongalgi, Anand D, MD  QUEtiapine  (SEROQUEL ) 50 MG tablet TAKE 1 TABLET BY MOUTH TWICE  DAILY Patient taking differently: Take 50-75 mg by mouth See admin instructions. Take 50 mg by mouth once a day and 75 mg at bedtime 02/01/23   Georjean Darice HERO, MD  sertraline  (ZOLOFT ) 100 MG tablet Take 1 tablet (100 mg total) by mouth in the morning and at bedtime. 12/11/22   Hongalgi, Anand D, MD  TYLENOL  500 MG tablet Take 1,000 mg by mouth in the morning and at bedtime.    [provider]  Vitamin D , Ergocalciferol , (DRISDOL)  1.25 MG (50000 UNIT) CAPS capsule Take 50,000 Units by mouth every 7 (seven) days. Takes on Fridays    [provider]    Allergies: Lactose intolerance (gi) and Oxycodone hcl    Review of Systems  Unable to perform ROS: Acuity of condition    Updated Vital Signs BP (!) 113/47   Pulse (!) 55   Temp (!) 97.5 F (36.4 C)   Resp 13   SpO2 98%   Physical Exam Vitals and nursing note reviewed.  Constitutional:      General: She is not in acute distress.    Appearance: She is well-developed.  HENT:     Head: Normocephalic and atraumatic.   Eyes:     Conjunctiva/sclera: Conjunctivae normal.  Cardiovascular:     Rate and Rhythm: Normal rate and regular rhythm.     Heart sounds: No murmur heard. Pulmonary:     Effort: Pulmonary effort is normal. No respiratory distress.     Breath sounds: Normal breath sounds.  Abdominal:     Palpations: Abdomen is soft.     Tenderness: There is no abdominal tenderness.  Musculoskeletal:        General: No swelling.     Cervical back: Neck supple.  Skin:    General: Skin is warm and dry.     Capillary Refill: Capillary refill takes less than 2 seconds.     Comments: Bruising/mild erythema to the left leg.  See photo below.  Neurological:     Mental Status: She is alert.  Psychiatric:        Mood and Affect: Mood normal.     (all labs ordered are listed, but only abnormal results are displayed) Labs Reviewed  CULTURE, BLOOD (ROUTINE X 2)  CULTURE, BLOOD (ROUTINE X 2)  CBC WITH DIFFERENTIAL/PLATELET  COMPREHENSIVE METABOLIC PANEL WITH GFR  PRO BRAIN NATRIURETIC PEPTIDE  URINALYSIS, W/ REFLEX TO CULTURE (INFECTION SUSPECTED)  URINE DRUG SCREEN  I-STAT CHEM 8, ED  I-STAT CG4 LACTIC ACID, ED  TROPONIN T, HIGH SENSITIVITY    EKG: None  Radiology: No results found.  {Document cardiac monitor, telemetry assessment procedure when appropriate:32947} Procedures   Medications Ordered in the ED - No data to display    {Click here for ABCD2, HEART and other calculators REFRESH Note before signing:1}                              Medical Decision Making Patient with multiple comorbidities found after apparent syncopal or near syncopal event in her dining area at her facility.  Patient is confused and amnestic as to recent events.  Broad workup initiated for possible syncope -will evaluate for possible infection, traumatic injury, etc.  Amount and/or Complexity of Data Reviewed Labs: ordered. Radiology: ordered.   ***  {Document critical care time when appropriate   Document review of labs and clinical decision tools ie CHADS2VASC2, etc  Document your independent review of radiology images and any outside records  Document your discussion with family members, caretakers and with consultants  Document social determinants of health affecting pt's care  Document your decision making why or why not admission, treatments were needed:32947:::1}   Final diagnoses:  None    ED Discharge Orders     None

## 2024-07-14 ENCOUNTER — Other Ambulatory Visit: Payer: Self-pay

## 2024-07-14 DIAGNOSIS — A419 Sepsis, unspecified organism: Secondary | ICD-10-CM | POA: Insufficient documentation

## 2024-07-14 DIAGNOSIS — E43 Unspecified severe protein-calorie malnutrition: Secondary | ICD-10-CM | POA: Insufficient documentation

## 2024-07-14 DIAGNOSIS — R404 Transient alteration of awareness: Secondary | ICD-10-CM | POA: Diagnosis not present

## 2024-07-14 LAB — COMPREHENSIVE METABOLIC PANEL WITH GFR
ALT: 13 U/L (ref 0–44)
AST: 16 U/L (ref 15–41)
Albumin: 3.1 g/dL — ABNORMAL LOW (ref 3.5–5.0)
Alkaline Phosphatase: 64 U/L (ref 38–126)
Anion gap: 9 (ref 5–15)
BUN: 27 mg/dL — ABNORMAL HIGH (ref 8–23)
CO2: 15 mmol/L — ABNORMAL LOW (ref 22–32)
Calcium: 8.1 mg/dL — ABNORMAL LOW (ref 8.9–10.3)
Chloride: 114 mmol/L — ABNORMAL HIGH (ref 98–111)
Creatinine, Ser: 1.64 mg/dL — ABNORMAL HIGH (ref 0.44–1.00)
GFR, Estimated: 29 mL/min — ABNORMAL LOW (ref >60)
Glucose, Bld: 85 mg/dL (ref 70–99)
Potassium: 5.4 mmol/L — ABNORMAL HIGH (ref 3.5–5.1)
Sodium: 138 mmol/L (ref 135–145)
Total Bilirubin: 0.2 mg/dL (ref 0.0–1.2)
Total Protein: 5.5 g/dL — ABNORMAL LOW (ref 6.5–8.1)

## 2024-07-14 LAB — URINALYSIS, W/ REFLEX TO CULTURE (INFECTION SUSPECTED)
Bilirubin Urine: NEGATIVE
Glucose, UA: NEGATIVE mg/dL
Ketones, ur: NEGATIVE mg/dL
Nitrite: NEGATIVE
Protein, ur: 30 mg/dL — AB
Specific Gravity, Urine: 1.014 (ref 1.005–1.030)
WBC, UA: >50 WBC/hpf (ref 0–5)
pH: 5 (ref 5.0–8.0)

## 2024-07-14 LAB — CBC WITH DIFFERENTIAL/PLATELET
Abs Immature Granulocytes: 0.05 K/uL (ref 0.00–0.07)
Basophils Absolute: 0.1 K/uL (ref 0.0–0.1)
Basophils Relative: 1 %
Eosinophils Absolute: 0 K/uL (ref 0.0–0.5)
Eosinophils Relative: 0 %
HCT: 19.9 % — ABNORMAL LOW (ref 36.0–46.0)
Hemoglobin: 6.5 g/dL — CL (ref 12.0–15.0)
Immature Granulocytes: 0 %
Lymphocytes Relative: 8 %
Lymphs Abs: 0.9 K/uL (ref 0.7–4.0)
MCH: 34 pg (ref 26.0–34.0)
MCHC: 32.7 g/dL (ref 30.0–36.0)
MCV: 104.2 fL — ABNORMAL HIGH (ref 80.0–100.0)
Monocytes Absolute: 0.9 K/uL (ref 0.1–1.0)
Monocytes Relative: 8 %
Neutro Abs: 9.7 K/uL — ABNORMAL HIGH (ref 1.7–7.7)
Neutrophils Relative %: 83 %
Platelets: 270 K/uL (ref 150–400)
RBC: 1.91 MIL/uL — ABNORMAL LOW (ref 3.87–5.11)
RDW: 14.7 % (ref 11.5–15.5)
WBC: 11.7 K/uL — ABNORMAL HIGH (ref 4.0–10.5)
nRBC: 0 % (ref 0.0–0.2)

## 2024-07-14 LAB — VITAMIN B12: Vitamin B-12: 403 pg/mL (ref 180–914)

## 2024-07-14 LAB — LACTIC ACID, PLASMA
Lactic Acid, Venous: 0.4 mmol/L — ABNORMAL LOW (ref 0.5–1.9)
Lactic Acid, Venous: 0.5 mmol/L (ref 0.5–1.9)

## 2024-07-14 LAB — IRON AND TIBC: Iron: <10 ug/dL — ABNORMAL LOW (ref 28–170)

## 2024-07-14 LAB — PREPARE RBC (CROSSMATCH)

## 2024-07-14 LAB — RETICULOCYTES
Immature Retic Fract: 6.3 % (ref 2.3–15.9)
RBC.: 1.91 MIL/uL — ABNORMAL LOW (ref 3.87–5.11)
Retic Count, Absolute: 41.2 K/uL (ref 19.0–186.0)
Retic Ct Pct: 2.1 % (ref 0.4–3.1)

## 2024-07-14 LAB — FOLATE: Folate: 5.5 ng/mL — ABNORMAL LOW (ref >5.9)

## 2024-07-14 LAB — FERRITIN: Ferritin: 148 ng/mL (ref 11–307)

## 2024-07-14 MED ORDER — SODIUM CHLORIDE 0.9 % IV SOLN: INTRAVENOUS | Status: DC

## 2024-07-14 MED ORDER — SODIUM CHLORIDE 0.9% IV SOLUTION: Freq: Once | INTRAVENOUS | Status: DC

## 2024-07-14 MED ORDER — LOPERAMIDE HCL 2 MG PO CAPS
2.0000 mg | ORAL_CAPSULE | ORAL | Status: DC | PRN
  Filled 2024-07-14 (×2): qty 1

## 2024-07-14 MED ORDER — SERTRALINE HCL 100 MG PO TABS
100.0000 mg | ORAL_TABLET | Freq: Every day | ORAL | Status: DC
  Administered 2024-07-14 – 2024-07-15 (×2): 100 mg via ORAL
  Filled 2024-07-14 (×2): qty 1

## 2024-07-14 MED ORDER — PANTOPRAZOLE SODIUM 40 MG PO TBEC
40.0000 mg | DELAYED_RELEASE_TABLET | Freq: Every day | ORAL | Status: DC
  Administered 2024-07-14 – 2024-07-15 (×2): 40 mg via ORAL
  Filled 2024-07-14 (×2): qty 1

## 2024-07-14 MED ORDER — FOLIC ACID 1 MG PO TABS
1.0000 mg | ORAL_TABLET | Freq: Every day | ORAL | Status: DC
  Administered 2024-07-14 – 2024-07-16 (×3): 1 mg via ORAL
  Filled 2024-07-14 (×3): qty 1

## 2024-07-14 MED ORDER — MEMANTINE HCL 10 MG PO TABS
10.0000 mg | ORAL_TABLET | Freq: Two times a day (BID) | ORAL | Status: DC
  Administered 2024-07-14 – 2024-07-16 (×5): 10 mg via ORAL
  Filled 2024-07-14 (×5): qty 1

## 2024-07-14 MED ORDER — ADULT MULTIVITAMIN W/MINERALS CH
1.0000 | ORAL_TABLET | Freq: Every day | ORAL | Status: DC
  Administered 2024-07-14 – 2024-07-16 (×3): 1 via ORAL
  Filled 2024-07-14 (×3): qty 1

## 2024-07-14 MED ORDER — ENSURE PLUS HIGH PROTEIN PO LIQD
237.0000 mL | Freq: Two times a day (BID) | ORAL | Status: DC
  Administered 2024-07-14 – 2024-07-16 (×4): 237 mL via ORAL

## 2024-07-14 MED ORDER — BREXPIPRAZOLE 1 MG PO TABS
0.5000 mg | ORAL_TABLET | Freq: Every day | ORAL | Status: DC
  Administered 2024-07-14 – 2024-07-15 (×2): 0.5 mg via ORAL
  Filled 2024-07-14 (×2): qty 0.5

## 2024-07-14 NOTE — ED Notes (Signed)
 Spoke to Dr.Stephens regarding purewick order, ok to keep purewick in place.

## 2024-07-14 NOTE — TOC Initial Note (Signed)
 Transition of Care [facility]) - Initial/Assessment Note    Patient Details  Name: [name] MRN: [id number] Date of Birth: Mar 19, 1929  [facility] CM/SW Contact:    Bascom Service, RN Phone Number: 07/14/2024, 3:35 PM  Clinical Narrative: [name] to [name] about d/c plans-return back to [facility] care rep [facility] aware. Await med stability. PTAR @ d/c.                  Expected Discharge Plan: Memory Care Barriers to Discharge: Continued Medical Work up   Patient Goals and CMS Choice Patient states their goals for this hospitalization and ongoing recovery are:: Return back to [place] memory care CMS Medicare.gov Compare Post Acute Care list provided to:: Patient Represenative (must comment) ([name]) Choice offered to / list presented to : Adult Children [place] ownership interest in [facility].provided to:: Adult Children    Expected Discharge Plan and Services   Discharge Planning Services: CM Consult Post Acute Care Choice:  (memory care) Living arrangements for the past 2 months: Skilled Nursing Facility [facility]memory care)                                      Prior Living Arrangements/Services Living arrangements for the past 2 months: Skilled Nursing Facility [facility]-memory care) Lives with:: Facility Resident   Do you feel safe going back to the place where you live?: Yes          Current home services: DME (w/c)    Activities of Daily Living   ADL Screening (condition at time of admission) Independently performs ADLs?: No Does the patient have a NEW difficulty with bathing/dressing/toileting/self-feeding that is expected to last >3 days?: No Does the patient have a NEW difficulty with getting in/out of bed, walking, or climbing stairs that is expected to last >3 days?: No Does the patient have a NEW difficulty with communication that is expected to last >3 days?: No Is the patient deaf or have difficulty  hearing?: Yes Does the patient have difficulty seeing, even when wearing glasses/contacts?: No Does the patient have difficulty concentrating, remembering, or making decisions?: Yes  Permission Sought/Granted Permission sought to share information with : Case Manager Permission granted to share information with : Yes, Verbal Permission Granted              Emotional Assessment              Admission diagnosis:  Unresponsive episode [R40.4] Acute cystitis without hematuria [N30.00] Patient Active Problem List   Diagnosis Date Noted   Sepsis (HCC) 07/14/2024   Protein-calorie malnutrition, severe 07/14/2024   Cystitis 07/13/2024   Unresponsive episode 07/13/2024   Diverticulitis 07/13/2024   AKI (acute kidney injury) 06/29/2024   Pain due to onychomycosis of toenails of both feet 08/19/2023   Acute encephalopathy 05/21/2023   Dysarthria 05/21/2023   Hypokalemia 05/21/2023   Hypertensive urgency 05/21/2023   Dementia without behavioral disturbance (HCC) 05/21/2023   Anxiety and depression 05/21/2023   Hypocalcemia 05/21/2023   GERD (gastroesophageal reflux disease) 05/21/2023   Fall 12/10/2022   Weakness 05/04/2022   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   CKD (chronic kidney disease) stage 3, GFR 30-59 ml/min (HCC) 03/31/2019   Anemia 03/07/2015   Transaminitis 11/20/2014   Hyponatremia 10/09/2014   Lightheaded 08/30/2014   Right hip pain 07/25/2014   Chronic neck pain 06/06/2014   Memory loss 06/23/2013  Right shoulder pain 06/23/2013   Eczema 01/16/2013   Leg edema 08/23/2012   Chronic insomnia 08/09/2012   Renal insufficiency 03/31/2011   CYSTOCELE WITHOUT MENTION UTERINE PROLAPSE LAT 08/29/2010   Anemia of chronic disease 05/06/2010   LIVER FUNCTION TESTS, ABNORMAL, HX OF 01/14/2009   Hypothyroidism 04/04/2007   Adjustment disorder with mixed anxiety and depressed mood 04/04/2007   Essential hypertension 04/04/2007   PCP:  Marston Junette HERO, MD Pharmacy:   [facility]  - [place], [place] - [address] [place] [address] Phone: [phone] Fax: [phone]     Social Drivers of Health (SDOH) Social History: SDOH Screenings   Food Insecurity: No Food Insecurity (06/05/2022)  Housing: Low Risk  (09/25/2020)  Transportation Needs: No Transportation Needs (06/05/2022)  Alcohol  Screen: Low Risk  (09/25/2020)  Depression (PHQ2-9): Low Risk  (06/05/2022)  Financial Resource Strain: Low Risk  (06/05/2022)  Physical Activity: Inactive (06/05/2022)  Social Connections: Patient Unable To Answer (07/14/2024)  Stress: Stress Concern Present (06/05/2022)  Tobacco Use: Medium Risk (07/13/2024)   SDOH Interventions:     Readmission Risk Interventions    06/29/2024   11:26 AM  Readmission Risk Prevention Plan  Transportation Screening Complete  PCP or Specialist Appt within 5-7 Days Complete  Home Care Screening Complete  Medication Review (RN CM) Complete

## 2024-07-14 NOTE — ED Notes (Signed)
 Pt noted to be febrile; several blankets removed.

## 2024-07-14 NOTE — NC FL2 (Signed)
 [place]  MEDICAID FL2 LEVEL OF CARE FORM     IDENTIFICATION  Patient Name: [name] Birthdate: [birth year] Sex: female Admission Date (Current Location): 07/13/2024  [place] and [place] Number:  Lloyd [id number] Facility and Address:  [facility],  [address], [place] [address]      Provider Number: [id number]  Attending Physician Name and Address:  Austria, Eric J, DO  Relative Name and Phone Number:  [name] Apgar(son) [phone]    Current Level of Care: Hospital Recommended Level of Care: Memory Care Prior Approval Number:    Date Approved/Denied:   PASRR Number:    Discharge Plan: Other (Comment) (return back to [facility] memory care)    Current Diagnoses: Patient Active Problem List   Diagnosis Date Noted   Sepsis (HCC) 07/14/2024   Protein-calorie malnutrition, severe 07/14/2024   Cystitis 07/13/2024   Unresponsive episode 07/13/2024   Diverticulitis 07/13/2024   AKI (acute kidney injury) 06/29/2024   Pain due to onychomycosis of toenails of both feet 08/19/2023   Acute encephalopathy 05/21/2023   Dysarthria 05/21/2023   Hypokalemia 05/21/2023   Hypertensive urgency 05/21/2023   Dementia without behavioral disturbance (HCC) 05/21/2023   Anxiety and depression 05/21/2023   Hypocalcemia 05/21/2023   GERD (gastroesophageal reflux disease) 05/21/2023   Fall 12/10/2022   Weakness 05/04/2022   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   CKD (chronic kidney disease) stage 3, GFR 30-59 ml/min (HCC) 03/31/2019   Anemia 03/07/2015   Transaminitis 11/20/2014   Hyponatremia 10/09/2014   Lightheaded 08/30/2014   Right hip pain 07/25/2014   Chronic neck pain 06/06/2014   Memory loss 06/23/2013   Right shoulder pain 06/23/2013   Eczema 01/16/2013   Leg edema 08/23/2012   Chronic insomnia 08/09/2012   Renal insufficiency 03/31/2011   CYSTOCELE WITHOUT MENTION UTERINE PROLAPSE LAT 08/29/2010   Anemia of chronic disease 05/06/2010   LIVER FUNCTION TESTS,  ABNORMAL, HX OF 01/14/2009   Hypothyroidism 04/04/2007   Adjustment disorder with mixed anxiety and depressed mood 04/04/2007   Essential hypertension 04/04/2007    Orientation RESPIRATION BLADDER Height & Weight     Self  Normal Incontinent Weight: 49 kg Height:  5' 3 (160 cm)  BEHAVIORAL SYMPTOMS/MOOD NEUROLOGICAL BOWEL NUTRITION STATUS      Incontinent Diet (soft)  AMBULATORY STATUS COMMUNICATION OF NEEDS Skin   Extensive Assist Verbally Other (Comment) (back wound-see d/c summary)                       Personal Care Assistance Level of Assistance  Bathing, Feeding, Dressing Bathing Assistance: Maximum assistance Feeding assistance: Maximum assistance Dressing Assistance: Maximum assistance     Functional Limitations Info  Sight, Hearing, Speech Sight Info: Impaired (eyeglasses) Hearing Info: Adequate Speech Info: Adequate    SPECIAL CARE FACTORS FREQUENCY                       Contractures Contractures Info: Not present    Additional Factors Info  Code Status, Allergies Code Status Info: DNR Allergies Info: Lactose intolerance;oxycodone           Current Medications (07/14/2024):  This is the current hospital active medication list Current Facility-Administered Medications  Medication Dose Route Frequency Provider Last Rate Last Admin   0.9 %  sodium chloride  infusion (Manually program via Guardrails IV Fluids)   Intravenous Once Stephens, Tiona K, MD       0.9 %  sodium chloride  infusion   Intravenous Continuous Austria,  Camellia PARAS, DO 75 mL/hr at 07/14/24 1341 Rate Change at 07/14/24 1341   acetaminophen  (TYLENOL ) tablet 650 mg  650 mg Oral Q6H PRN Stephens, Tiona K, MD   650 mg at 07/14/24 0004   Or   acetaminophen  (TYLENOL ) suppository 650 mg  650 mg Rectal Q6H PRN Stephens, Tiona K, MD       brexpiprazole  (REXULTI ) tablet 0.5 mg  0.5 mg Oral QHS Austria, Eric J, DO       cefTRIAXone  (ROCEPHIN ) 2 g in sodium chloride  0.9 % 100 mL IVPB  2 g  Intravenous Q24H Stephens, Tiona K, MD 200 mL/hr at 07/14/24 0941 2 g at 07/14/24 0941   feeding supplement (ENSURE PLUS HIGH PROTEIN) liquid 237 mL  237 mL Oral BID BM Austria, Eric J, DO   237 mL at 07/14/24 1406   folic acid  (FOLVITE ) tablet 1 mg  1 mg Oral Daily Austria, Eric J, DO   1 mg at 07/14/24 1401   levothyroxine  (SYNTHROID ) tablet 175 mcg  175 mcg Oral Q0600 Stephens, Tiona K, MD   175 mcg at 07/14/24 0524   loperamide  (IMODIUM ) capsule 2 mg  2 mg Oral PRN Austria, Camellia PARAS, DO       memantine  (NAMENDA ) tablet 10 mg  10 mg Oral BID Austria, Eric J, DO   10 mg at 07/14/24 1401   metroNIDAZOLE  (FLAGYL ) IVPB 500 mg  500 mg Intravenous Q12H Stephens, Tiona K, MD 100 mL/hr at 07/14/24 1210 500 mg at 07/14/24 1210   multivitamin with minerals tablet 1 tablet  1 tablet Oral Daily Austria, Eric J, DO   1 tablet at 07/14/24 1406   pantoprazole  (PROTONIX ) EC tablet 40 mg  40 mg Oral QHS Austria, Eric J, DO       prochlorperazine  (COMPAZINE ) injection 10 mg  10 mg Intravenous Q4H PRN Stephens, Tiona K, MD       senna-docusate (Senokot-S) tablet 2 tablet  2 tablet Oral QHS Stephens, Tiona K, MD   2 tablet at 07/14/24 0003   sertraline  (ZOLOFT ) tablet 100 mg  100 mg Oral QHS Austria, Eric J, DO         Discharge Medications: Please see discharge summary for a list of discharge medications.  Relevant Imaging Results:  Relevant Lab Results:   Additional Information ss#[id number]  Arthella Headings, Nathanel, RN

## 2024-07-14 NOTE — Progress Notes (Signed)
 Initial Nutrition Assessment  DOCUMENTATION CODES:   Severe malnutrition in context of chronic illness  INTERVENTION:   -Ensure Plus High Protein po BID, each supplement provides 350 kcal and 20 grams of protein.   -Multivitamin with minerals daily  NUTRITION DIAGNOSIS:   Severe Malnutrition related to chronic illness (dementia) as evidenced by severe fat depletion, severe muscle depletion.  GOAL:   Patient will meet greater than or equal to 90% of their needs  MONITOR:   PO intake, Supplement acceptance  REASON FOR ASSESSMENT:   Consult Assessment of nutrition requirement/status  ASSESSMENT:   89 y.o. female with medical history significant of subarachnoid hemorrhage status post fall, Lewy body dementia, hypothyroidism, hypertension, CKD stage IIIa, anemia, chronic fatigue who had an unresponsive episode at the nursing facility.  Patient in room, no family present. Pt unable to provide any history, mostly mumbled responses.  Per chart review, pt now on soft diet, will order Ensure supplements. Family had reported pt has not been eating well. Was being treated for diverticulitis.   Per weight records, pt has lost 36 lbs since October 2024 (25% wt loss x >1 year). Significant given advanced age.  Medications: Folic acid , Senokot  Labs reviewed: Elevated potassium (5.4) Low iron (<10) Low folate   NUTRITION - FOCUSED PHYSICAL EXAM:  Flowsheet Row Most Recent Value  Orbital Region Severe depletion  Upper Arm Region Severe depletion  Thoracic and Lumbar Region Moderate depletion  Buccal Region Severe depletion  Temple Region Severe depletion  Clavicle Bone Region Severe depletion  Clavicle and Acromion Bone Region Severe depletion  Scapular Bone Region Severe depletion  Dorsal Hand Severe depletion  Patellar Region Severe depletion  Anterior Thigh Region Severe depletion  Posterior Calf Region Severe depletion  Edema (RD Assessment) None  Hair Reviewed   [thinning]  Eyes Reviewed  Mouth Reviewed  [missing some teeth]  Skin Reviewed  Nails Reviewed    Diet Order:   Diet Order             DIET SOFT Room service appropriate? No; Fluid consistency: Thin  Diet effective now                   EDUCATION NEEDS:   Not appropriate for education at this time  Skin:  Skin Assessment: Skin Integrity Issues: Skin Integrity Issues:: Stage II, Other (Comment) Stage II: sacrum Other: wound on right arm  Last BM:  12/5 -type 7  Height:   Ht Readings from Last 1 Encounters:  07/14/24 5' 3 (1.6 m)    Weight:   Wt Readings from Last 1 Encounters:  07/14/24 49 kg    BMI:  Body mass index is 19.14 kg/m.  Estimated Nutritional Needs:   Kcal:  1200-1400  Protein:  55-70g  Fluid:  1.4L/day   Morna Lee, MS, RD, LDN Inpatient Clinical Dietitian Contact via Secure chat

## 2024-07-14 NOTE — Progress Notes (Addendum)
 PROGRESS NOTE    [name]  FMW:[id number] DOB: 10-10-1928 DOA: 07/13/2024 PCP: Marston Junette HERO, MD    Brief Narrative:   [name] is a 89 y.o. female with past medical history significant for Lewy body dementia, hypothyroidism, HTN, CKD stage IIIa, anemia of chronic medical disease, history of SAH s/p fall, chronic fatigue who presented to [facility] ED on 07/13/2024 from [facility] for altered mental status with unresponsive episode at the nursing facility.  According to the patient's son, patient was recently hospitalized for acute diverticulitis and was treated with IV antibiotics subsequently transition to p.o. antibiotics. Patient was discharged back to [facility] which is her assisted living facility. During the hospitalization patient was noted to have low hemoglobin at 6.7. Repeat without transfusion demonstrated hemoglobin of 7 and therefore patient was not emergently transfused. There was no localized source of bleeding at the time. She also underwent evaluation for macrocytic anemia. B12, folate and iron panel were not revealing at that time. Patient was subsequently discharged back to the facility. He reports that she has been declining for a period of time and that in spite of him taking ensure drinks. She has severe lewy body dementia that appears to be progressing as well.   In the ED, temperature 97.5 F, HR 55, RR 13, BP 113/47, SpO2 96% on room air.  WBC 8.9, hemoglobin 7.0, platelet count 302, MCV 105.7.  Sodium 136, potassium 5.3, chloride 111, CO2 16, glucose 97, BUN 31, creat 1.79.  AST 15, ALT 15, total bilirubin 0.2.  BNP 460.0.  High-sensitivity troponin 20.  Lactic acid 0.4.  Urinalysis with large leukocytes, negative nitrite, few bacteria, greater than 50 WBCs.  UDS negative.  Chest x-ray with no acute cardiopulmonary disease process.  Pelvis x-ray with no evidence of acute fracture or dislocation.  Left tibia/fibula x-ray with no acute fracture of  the left lower leg, plate and screw fixation of the proximal tibia with intact hardware.  CT head with no acute intracranial findings, age-related atrophic change and chronic small vessel microvascular disease.  CT C-spine with no acute cervical spine injury, moderate spondylosis of the cervical spine with multilevel neural foraminal narrowing, anterior fusion hardware at C4-5 and C6/7 levels, chronic stable fracture of right C7 screw.  CT chest/abdomen/pelvis with mild cardiomegaly with trace pericardial effusion, no pneumonia/pulmonary edema/pleural effusion, total colonic diverticulosis with subtle inflammatory stranding about the distal descending and sigmoid colon consistent with diverticulitis, no peridiverticular abscess or pneumoperitoneum.  Assessment & Plan:   Acute metabolic encephalopathy, POA Urinary tract infection Acute diverticulitis Patient presenting from facility via EMS after confusion, unresponsive episode.  Patient was afebrile with no leukocytosis.  Lactic acid within normal limits.  Urinalysis consistent with UTI.  Chest x-ray with no focal consolidation.  CT chest/abdomen/pelvis with findings of inflammatory stranding distal descending and sigmoid colon consistent with diverticulitis/colitis.  UDS negative. -- WBC 8.9>11.7 -- Blood cultures x 2: No growth less than 24 hours -- Urine culture: Pending -- NS at 75 mL/h -- Ceftriaxone  2 g IV every 24 hours -- Metronidazole  500 mg IV q12h -- CBC daily -- Monitor on telemetry  Acute on chronic anemia of chronic medical disease  Folic acid  deficiency Anemia panel with iron less than 10, ferritin 148, folate 5.5, vitamin B12 403. -- Hgb 7.0>8.2>6.5 -- transfuse 1u pRBC, repeat H&H following transfusion -- Start folic acid  1 mg p.o. daily  Hyperkalemia -- K 5.3>5.3>5.2>5.4 -- Continue IV fluid hydration as above --  Repeat BMP in the a.m., if potassium remains elevated, will give dose Lokelma  Lewy body  dementia --Delirium precautions --Get up during the day --Encourage a familiar face to remain present throughout the day --Keep blinds open and lights on during daylight hours --Minimize the use of opioids/benzodiazepines -- Namenda  10 mg p.o. twice daily -- Zoloft  100 mg p.o. nightly -- Rexulti  0.5 g p.o. nightly  Hypothyroidism -- Levothyroxine  175 mcg p.o. daily  HTN BP 148/54 -- Continue to hold home amlodipine  10 mg PO daily and hydralazine  50mg  PO BID, isosorbide  mononitrate 20 mg PO BID, olmesartan  40 mg p.o. daily for now  CKD stage IIIa -- Cr 1.79>2.00>1.80> 1.64; stable (baseline 1.8-2.0) -- BMP in am   DVT prophylaxis: SCDs Start: 07/13/24 2335    Code Status: Do not attempt resuscitation (DNR) PRE-ARREST INTERVENTIONS DESIRED Family Communication: No family present at bedside this morning, updated patient's son via telephone this afternoon  Disposition Plan:  Level of care: Telemetry Status is: Inpatient Remains inpatient appropriate because: IV antibiotics, awaiting culture results    Consultants:  None  Procedures:  None  Antimicrobials:  Ceftriaxone  12/4>> Metronidazole  12/4>>   Subjective: Patient seen examined bedside, lying in bed.  No family present.  Alert but pleasantly confused.  Oriented to self.  Unable to obtain any further ROS from patient given her underlying dementia.  Appears nontoxic, no acute distress.  No acute concerns overnight per nursing staff.  Objective: Vitals:   07/14/24 0801 07/14/24 0942 07/14/24 1002 07/14/24 1239  BP:  (!) 128/48 (!) 145/48 (!) 139/55  Pulse:  65 69 66  Resp:  (!) 24 (!) 23 18  Temp:  98.7 F (37.1 C) 98.4 F (36.9 C) 98.8 F (37.1 C)  TempSrc:  Axillary Axillary Axillary  SpO2:  100% 100% 98%  Weight: 49 kg     Height: 5' 3 (1.6 m)       Intake/Output Summary (Last 24 hours) at 07/14/2024 1257 Last data filed at 07/14/2024 1250 Gross per 24 hour  Intake 344 ml  Output 775 ml  Net -431 ml    Filed Weights   07/14/24 0144 07/14/24 0801  Weight: 49.4 kg 49 kg    Examination:  Physical Exam: GEN: NAD, alert, oriented to self, pleasantly confused, chronically ill/elderly in appearance HEENT: NCAT, PERRL, EOMI, sclera clear, MMM PULM: CTAB w/o wheezes/crackles, normal respiratory effort, on room air CV: RRR w/o M/G/R GI: abd soft, NTND, + BS MSK: no peripheral edema, moves all extremities independently NEURO: No focal neurologic deficit other than baseline confusion Integumentary: No concerning rashes/lesions/wounds nonexposed skin surfaces    Data Reviewed: I have personally reviewed following labs and imaging studies  CBC: Recent Labs  Lab 07/13/24 1430 07/13/24 1453 07/13/24 2216 07/14/24 0017  WBC 8.9  --   --  11.7*  NEUTROABS 7.3  --   --  9.7*  HGB 7.0* 8.2* 7.1* 6.5*  HCT 22.4* 24.0* 21.0* 19.9*  MCV 105.7*  --   --  104.2*  PLT 302  --   --  270   Basic Metabolic Panel: Recent Labs  Lab 07/13/24 1430 07/13/24 1453 07/13/24 2216 07/14/24 0017  NA 136 139 140 138  K 5.3* 5.3* 5.2* 5.4*  CL 111 112* 116* 114*  CO2 16*  --   --  15*  GLUCOSE 97 96 86 85  BUN 31* 30* 27* 27*  CREATININE 1.79* 2.00* 1.80* 1.64*  CALCIUM  8.3*  --   --  8.1*  GFR: Estimated Creatinine Clearance: 16.6 mL/min (A) (by C-G formula based on SCr of 1.64 mg/dL (H)). Liver Function Tests: Recent Labs  Lab 07/13/24 1430 07/14/24 0017  AST 15 16  ALT 15 13  ALKPHOS 74 64  BILITOT 0.2 0.2  PROT 6.1* 5.5*  ALBUMIN 3.4* 3.1*   No results for input(s): LIPASE, AMYLASE in the last 168 hours. No results for input(s): AMMONIA in the last 168 hours. Coagulation Profile: No results for input(s): INR, PROTIME in the last 168 hours. Cardiac Enzymes: No results for input(s): CKTOTAL, CKMB, CKMBINDEX, TROPONINI in the last 168 hours. BNP (last 3 results) Recent Labs    06/28/24 1850 07/13/24 1430  PROBNP 454.0* 460.0*   HbA1C: No results for  input(s): HGBA1C in the last 72 hours. CBG: No results for input(s): GLUCAP in the last 168 hours. Lipid Profile: No results for input(s): CHOL, HDL, LDLCALC, TRIG, CHOLHDL, LDLDIRECT in the last 72 hours. Thyroid  Function Tests: No results for input(s): TSH, T4TOTAL, FREET4, T3FREE, THYROIDAB in the last 72 hours. Anemia Panel: Recent Labs    07/14/24 0017  VITAMINB12 403  FOLATE 5.5*  FERRITIN 148  TIBC PENDING  IRON <10*  RETICCTPCT 2.1   Sepsis Labs: Recent Labs  Lab 07/13/24 1454 07/13/24 1633 07/14/24 0017 07/14/24 0530  LATICACIDVEN 0.4* <0.3* 0.4* 0.5    Recent Results (from the past 240 hours)  Culture, blood (routine x 2)     Status: None (Preliminary result)   Collection Time: 07/13/24  2:30 PM   Specimen: BLOOD  Result Value Ref Range Status   Specimen Description   Final    BLOOD BLOOD LEFT FOREARM Performed at [facility], [address] [place], [place] [address]    Special Requests   Final    BOTTLES DRAWN AEROBIC AND ANAEROBIC Blood Culture adequate volume Performed at [facility], [address] [place], [place] [address]    Culture   Final    NO GROWTH < 24 HOURS Performed at [facility], [address], [place], [place] [address]    Report Status PENDING  Incomplete  Culture, blood (routine x 2)     Status: None (Preliminary result)   Collection Time: 07/13/24  2:38 PM   Specimen: BLOOD  Result Value Ref Range Status   Specimen Description   Final    BLOOD BLOOD RIGHT FOREARM Performed at [facility], [address], [place], [place] [address]    Special Requests   Final    BOTTLES DRAWN AEROBIC AND ANAEROBIC Blood Culture adequate volume Performed at [facility], [address], [place], [place] [address]    Culture   Final    NO GROWTH < 24 HOURS Performed at [facility], [address] [place], [place] [address]     Report Status PENDING  Incomplete  Urine Culture     Status: None (Preliminary result)   Collection Time: 07/13/24  8:42 PM   Specimen: Urine, Clean Catch  Result Value Ref Range Status   Specimen Description   Final    URINE, CLEAN CATCH Performed at [facility], [address], [place], [place] [address]    Special Requests   Final    NONE Reflexed from [id number] Performed at [facility], [address] [place], [place] [address]    Culture PENDING  Incomplete   Report Status PENDING  Incomplete  Radiology Studies: CT Head Wo Contrast Result Date: 07/13/2024 CLINICAL DATA:  Altered mental status.  Found unconscious. EXAM: CT HEAD WITHOUT CONTRAST CT CERVICAL SPINE WITHOUT CONTRAST TECHNIQUE: Multidetector CT imaging of the head and cervical spine was performed following the standard protocol without intravenous contrast. Multiplanar CT image reconstructions of the cervical spine were also generated. RADIATION DOSE REDUCTION: This exam was performed according to the departmental dose-optimization program which includes automated exposure control, adjustment of the mA and/or kV according to patient size and/or use of iterative reconstruction technique. COMPARISON:  CT head/cervical spine 01/13/2024 FINDINGS: CT HEAD FINDINGS Brain: Ventricles, cisterns and other CSF spaces are within normal with mild age related atrophic change present. There is mild chronic ischemic microvascular disease. No mass, mass effect, shift of midline structures or acute hemorrhage. No evidence of acute infarction. Vascular: No hyperdense vessel or unexpected calcification. Skull: Normal. Negative for fracture or focal lesion. Sinuses/Orbits: No acute finding. Other: None. CT CERVICAL SPINE FINDINGS Alignment: Normal. Skull base and vertebrae: Vertebral body heights are maintained. There is mild to moderate spondylosis of the cervical spine to include uncovertebral joint spurring  and facet arthropathy. Anterior fusion hardware at the C4-5 level is intact and unchanged. Anterior fusion hardware at the C6-7 level is unchanged and demonstrates continued evidence fracture of the right C7 vertebral body screw. No acute fracture. Moderate bilateral neural foraminal narrowing at the C3-4 level. Moderate right-sided neural foraminal narrowing at the C5-6 level Soft tissues and spinal canal: No prevertebral fluid or swelling. No visible canal hematoma. Disc levels: Interbody fusion at the C4-5 and C6-7 levels as the remaining disc spaces are unremarkable. Upper chest: No acute findings. Other: None. IMPRESSION: 1. No acute intracranial findings. 2. Age related atrophic change and chronic ischemic microvascular disease. 3. No acute cervical spine injury. 4. Moderate spondylosis of the cervical spine with multilevel neural foraminal narrowing as described. 5. Anterior fusion hardware at the C4-5 and C6-7 levels. Chronic stable fracture of the right C7 screw. Electronically Signed   By: Toribio Agreste M.D.   On: 07/13/2024 16:40   CT Cervical Spine Wo Contrast Result Date: 07/13/2024 CLINICAL DATA:  Altered mental status.  Found unconscious. EXAM: CT HEAD WITHOUT CONTRAST CT CERVICAL SPINE WITHOUT CONTRAST TECHNIQUE: Multidetector CT imaging of the head and cervical spine was performed following the standard protocol without intravenous contrast. Multiplanar CT image reconstructions of the cervical spine were also generated. RADIATION DOSE REDUCTION: This exam was performed according to the departmental dose-optimization program which includes automated exposure control, adjustment of the mA and/or kV according to patient size and/or use of iterative reconstruction technique. COMPARISON:  CT head/cervical spine 01/13/2024 FINDINGS: CT HEAD FINDINGS Brain: Ventricles, cisterns and other CSF spaces are within normal with mild age related atrophic change present. There is mild chronic ischemic  microvascular disease. No mass, mass effect, shift of midline structures or acute hemorrhage. No evidence of acute infarction. Vascular: No hyperdense vessel or unexpected calcification. Skull: Normal. Negative for fracture or focal lesion. Sinuses/Orbits: No acute finding. Other: None. CT CERVICAL SPINE FINDINGS Alignment: Normal. Skull base and vertebrae: Vertebral body heights are maintained. There is mild to moderate spondylosis of the cervical spine to include uncovertebral joint spurring and facet arthropathy. Anterior fusion hardware at the C4-5 level is intact and unchanged. Anterior fusion hardware at the C6-7 level is unchanged and demonstrates continued evidence fracture of the right C7 vertebral body screw. No acute fracture. Moderate bilateral neural foraminal narrowing at the C3-4 level. Moderate right-sided  neural foraminal narrowing at the C5-6 level Soft tissues and spinal canal: No prevertebral fluid or swelling. No visible canal hematoma. Disc levels: Interbody fusion at the C4-5 and C6-7 levels as the remaining disc spaces are unremarkable. Upper chest: No acute findings. Other: None. IMPRESSION: 1. No acute intracranial findings. 2. Age related atrophic change and chronic ischemic microvascular disease. 3. No acute cervical spine injury. 4. Moderate spondylosis of the cervical spine with multilevel neural foraminal narrowing as described. 5. Anterior fusion hardware at the C4-5 and C6-7 levels. Chronic stable fracture of the right C7 screw. Electronically Signed   By: Toribio Agreste M.D.   On: 07/13/2024 16:40   CT CHEST ABDOMEN PELVIS WO CONTRAST Result Date: 07/13/2024 CLINICAL DATA:  Sepsis, altered mental status. EXAM: CT CHEST, ABDOMEN AND PELVIS WITHOUT CONTRAST TECHNIQUE: Multidetector CT imaging of the chest, abdomen and pelvis was performed following the standard protocol without IV contrast. RADIATION DOSE REDUCTION: This exam was performed according to the departmental  dose-optimization program which includes automated exposure control, adjustment of the mA and/or kV according to patient size and/or use of iterative reconstruction technique. COMPARISON:  June 28, 2024, Dec 09, 2022 FINDINGS: Of note, the lack of intravenous contrast limits evaluation of the solid organ parenchyma and vascularity. CT CHEST FINDINGS Cardiovascular: Mild cardiomegaly. Trace pericardial effusion. Dense multi-vessel coronary atherosclerosis.No aortic aneurysm. Diffuse calcified aortic atherosclerosis. Mediastinum/Nodes: No mediastinal mass.No mediastinal, hilar, or axillary lymphadenopathy. Lungs/Pleura: The midline trachea and bronchi are patent. Biapical pleuroparenchymal scarring. No focal airspace consolidation, pleural effusion, or pneumothorax. CT ABDOMEN PELVIS FINDINGS Hepatobiliary: No mass.Cholecystectomy. Moderate dilation of the intrahepatic and extrahepatic bile ducts, likely related to the prior cholecystectomy. Pancreas: No mass or main ductal dilation. No peripancreatic inflammation or fluid collection. Spleen: Normal size. No mass. Adrenals/Urinary Tract: No adrenal masses. No renal mass. No hydronephrosis or nephrolithiasis. The urinary bladder is distended without focal abnormality. Stomach/Bowel: The stomach is decompressed without focal abnormality. Large bilobed periampullary duodenum diverticulum measuring 4.4 cm. No small bowel wall thickening or inflammation. No small bowel obstruction.Normal appendix. Total colonic diverticulosis. Subtle inflammatory stranding about the distal descending and sigmoid colon. No peridiverticular abscess. Vascular/Lymphatic: No aortic aneurysm. Diffuse aortoiliac atherosclerosis. No intraabdominal or pelvic lymphadenopathy. Reproductive: Hysterectomy. No concerning adnexal mass. No free pelvic fluid. Other: No pneumoperitoneum or ascites. Musculoskeletal: No acute fracture or destructive lesion. Osteopenia. Multilevel degenerative disc disease  of the spine. Thoracic DISH. Anterior cervical fusion hardware. IMPRESSION: 1. Mild cardiomegaly with trace pericardial effusion. No pneumonia, pulmonary edema, or pleural effusion. 2. Total colonic diverticulosis. Subtle inflammatory stranding about the distal descending and sigmoid colon, which may reflect changes of an infectious or inflammatory colitis or acute diverticulitis. No peridiverticular abscess or pneumoperitoneum. Aortic Atherosclerosis (ICD10-I70.0). Electronically Signed   By: Rogelia Myers M.D.   On: 07/13/2024 16:37   DG Tibia/Fibula Left Result Date: 07/13/2024 CLINICAL DATA:  Provided history of fall. EXAM: LEFT TIBIA AND FIBULA - 2 VIEW COMPARISON:  Knee radiograph 08/21/2023 FINDINGS: Plate and screw fixation of the proximal tibia. The hardware is intact. No periprosthetic lucency. No acute fracture of the lower leg. No knee or ankle dislocation. Peripheral vascular calcifications are seen. Mild soft tissue edema. IMPRESSION: 1. No acute fracture of the left lower leg. 2. Plate and screw fixation of the proximal tibia. Hardware is intact. Electronically Signed   By: Andrea Gasman M.D.   On: 07/13/2024 15:32   DG Pelvis 1-2 Views Result Date: 07/13/2024 CLINICAL DATA:  Provided history of fall. Dementia  patient with altered mental status. EXAM: PELVIS - 1-2 VIEW COMPARISON:  01/13/2024 FINDINGS: The patient is rotated limiting assessment. Allowing for this, no evidence of acute fracture. No hip dislocation. The bones are subjectively under mineralized. Degenerative changes of the pubic symphysis and sacroiliac joints without diastasis. IMPRESSION: 1. No evidence of acute fracture or dislocation. 2. Degenerative changes of the pubic symphysis and sacroiliac joints. Electronically Signed   By: Andrea Gasman M.D.   On: 07/13/2024 15:30   DG Chest Port 1 View Result Date: 07/13/2024 CLINICAL DATA:  Provided history of fall. Dementia patient with altered mental status. EXAM:  PORTABLE CHEST 1 VIEW COMPARISON:  Radiograph 06/28/2024 FINDINGS: The cardiomediastinal contours are normal. Aortic atherosclerosis. Pulmonary vasculature is normal. No consolidation, pleural effusion, or pneumothorax. Chronic calcified granuloma in the right lung. No acute osseous abnormalities are seen. IMPRESSION: No acute chest findings. Electronically Signed   By: Andrea Gasman M.D.   On: 07/13/2024 15:29        Scheduled Meds:  sodium chloride    Intravenous Once   levothyroxine   175 mcg Oral Q0600   senna-docusate  2 tablet Oral QHS   Continuous Infusions:  sodium chloride  100 mL/hr at 07/14/24 9285   cefTRIAXone  (ROCEPHIN )  IV 2 g (07/14/24 0941)   metronidazole  500 mg (07/14/24 1210)     LOS: 1 day    Time spent: 51 minutes spent on 07/14/2024 caring for this patient face-to-face including chart review, ordering labs/tests, documenting, discussion with nursing staff, consultants, updating family and interview/physical exam    Camellia PARAS Yarithza Mink, DO [facility] Available via Epic secure chat 7am-7pm After these hours, please refer to coverage provider listed on [email or web address] 07/14/2024, 12:57 PM

## 2024-07-14 NOTE — Plan of Care (Signed)
 Patient was noted to have a drop in hb to 6.5. There are no bleeding noted on exam. Verbal consent was obtained over the phone from the patient's son for blood transfusion.  A unit of blood has been ordered at this time.

## 2024-07-14 NOTE — Plan of Care (Signed)
 Patient has spike a temperature of 101 and RR 22-24 earlier with source of urinary vs colon patient has already been started on IV antibiotics to cover both uti and diverticulitis.

## 2024-07-15 DIAGNOSIS — R404 Transient alteration of awareness: Secondary | ICD-10-CM | POA: Diagnosis not present

## 2024-07-15 LAB — CBC
HCT: 23.3 % — ABNORMAL LOW (ref 36.0–46.0)
Hemoglobin: 7.5 g/dL — ABNORMAL LOW (ref 12.0–15.0)
MCH: 32.8 pg (ref 26.0–34.0)
MCHC: 32.2 g/dL (ref 30.0–36.0)
MCV: 101.7 fL — ABNORMAL HIGH (ref 80.0–100.0)
Platelets: 213 K/uL (ref 150–400)
RBC: 2.29 MIL/uL — ABNORMAL LOW (ref 3.87–5.11)
RDW: 16.4 % — ABNORMAL HIGH (ref 11.5–15.5)
WBC: 10.1 K/uL (ref 4.0–10.5)
nRBC: 0 % (ref 0.0–0.2)

## 2024-07-15 LAB — BASIC METABOLIC PANEL WITH GFR
Anion gap: 7 (ref 5–15)
BUN: 19 mg/dL (ref 8–23)
CO2: 16 mmol/L — ABNORMAL LOW (ref 22–32)
Calcium: 8 mg/dL — ABNORMAL LOW (ref 8.9–10.3)
Chloride: 114 mmol/L — ABNORMAL HIGH (ref 98–111)
Creatinine, Ser: 1.28 mg/dL — ABNORMAL HIGH (ref 0.44–1.00)
GFR, Estimated: 39 mL/min — ABNORMAL LOW (ref >60)
Glucose, Bld: 84 mg/dL (ref 70–99)
Potassium: 4.4 mmol/L (ref 3.5–5.1)
Sodium: 138 mmol/L (ref 135–145)

## 2024-07-15 LAB — PHOSPHORUS: Phosphorus: 2.4 mg/dL — ABNORMAL LOW (ref 2.5–4.6)

## 2024-07-15 LAB — MAGNESIUM: Magnesium: 1.9 mg/dL (ref 1.7–2.4)

## 2024-07-15 MED ORDER — FOSFOMYCIN TROMETHAMINE 3 G PO PACK
3.0000 g | PACK | Freq: Once | ORAL | Status: AC
  Administered 2024-07-15: 3 g via ORAL
  Filled 2024-07-15: qty 3

## 2024-07-15 MED ORDER — SODIUM CHLORIDE 0.9 % IV SOLN: INTRAVENOUS | Status: AC

## 2024-07-15 MED ORDER — SODIUM CHLORIDE 0.9 % IV SOLN
3.0000 g | Freq: Two times a day (BID) | INTRAVENOUS | Status: DC
  Administered 2024-07-15 – 2024-07-16 (×2): 3 g via INTRAVENOUS
  Filled 2024-07-15 (×3): qty 8

## 2024-07-15 NOTE — Progress Notes (Addendum)
 PROGRESS NOTE    [name]  FMW:[id number] DOB: June 26, 1929 DOA: 07/13/2024 PCP: Marston Junette HERO, MD    Brief Narrative:   [name] is a 89 y.o. female with past medical history significant for Lewy body dementia, hypothyroidism, HTN, CKD stage IIIa, anemia of chronic medical disease, history of SAH s/p fall, chronic fatigue who presented to [facility] ED on 07/13/2024 from [facility] for altered mental status with unresponsive episode at the nursing facility.  According to the patient's son, patient was recently hospitalized for acute diverticulitis and was treated with IV antibiotics subsequently transition to p.o. antibiotics. Patient was discharged back to [facility] which is her assisted living facility. During the hospitalization patient was noted to have low hemoglobin at 6.7. Repeat without transfusion demonstrated hemoglobin of 7 and therefore patient was not emergently transfused. There was no localized source of bleeding at the time. She also underwent evaluation for macrocytic anemia. B12, folate and iron panel were not revealing at that time. Patient was subsequently discharged back to the facility. He reports that she has been declining for a period of time and that in spite of him taking ensure drinks. She has severe lewy body dementia that appears to be progressing as well.   In the ED, temperature 97.5 F, HR 55, RR 13, BP 113/47, SpO2 96% on room air.  WBC 8.9, hemoglobin 7.0, platelet count 302, MCV 105.7.  Sodium 136, potassium 5.3, chloride 111, CO2 16, glucose 97, BUN 31, creat 1.79.  AST 15, ALT 15, total bilirubin 0.2.  BNP 460.0.  High-sensitivity troponin 20.  Lactic acid 0.4.  Urinalysis with large leukocytes, negative nitrite, few bacteria, greater than 50 WBCs.  UDS negative.  Chest x-ray with no acute cardiopulmonary disease process.  Pelvis x-ray with no evidence of acute fracture or dislocation.  Left tibia/fibula x-ray with no acute fracture of  the left lower leg, plate and screw fixation of the proximal tibia with intact hardware.  CT head with no acute intracranial findings, age-related atrophic change and chronic small vessel microvascular disease.  CT C-spine with no acute cervical spine injury, moderate spondylosis of the cervical spine with multilevel neural foraminal narrowing, anterior fusion hardware at C4-5 and C6/7 levels, chronic stable fracture of right C7 screw.  CT chest/abdomen/pelvis with mild cardiomegaly with trace pericardial effusion, no pneumonia/pulmonary edema/pleural effusion, total colonic diverticulosis with subtle inflammatory stranding about the distal descending and sigmoid colon consistent with diverticulitis, no peridiverticular abscess or pneumoperitoneum.  Assessment & Plan:   Acute metabolic encephalopathy, POA Urinary tract infection Acute diverticulitis Patient presenting from facility via EMS after confusion, unresponsive episode.  Patient was afebrile with no leukocytosis.  Lactic acid within normal limits.  Urinalysis consistent with UTI.  Chest x-ray with no focal consolidation.  CT chest/abdomen/pelvis with findings of inflammatory stranding distal descending and sigmoid colon consistent with diverticulitis/colitis.  UDS negative. -- WBC 8.9>11.7>10.1 -- Blood cultures x 2: No growth less than 24 hours -- Urine culture: Citrobacter farmeri, Enterococcus gallinarum -- NS at 75 mL/h -- Changed to Unasyn  and 1 dose of fosfomycin for coverage -- CBC daily -- Monitor on telemetry  Acute on chronic anemia of chronic medical disease  Folic acid  deficiency Anemia panel with iron less than 10, ferritin 148, folate 5.5, vitamin B12 403. -- Hgb 7.0>8.2>6.5>7.5 -- transfuseed 1u pRBC on 12/5 -- Started folic acid  1 mg p.o. daily -- repeat CBC in am  Hyperkalemia: Resolved -- K 5.3>5.3>5.2>5.4>4.4 -- Continue IV  fluid hydration as above -- Repeat BMP in the a.m., if potassium remains elevated, will  give dose Lokelma  Lewy body dementia --Delirium precautions --Get up during the day --Encourage a familiar face to remain present throughout the day --Keep blinds open and lights on during daylight hours --Minimize the use of opioids/benzodiazepines -- Namenda  10 mg p.o. twice daily -- Zoloft  100 mg p.o. nightly -- Rexulti  0.5 g p.o. nightly  Hypothyroidism -- Levothyroxine  175 mcg p.o. daily  HTN BP 156/72 -- Restart amlodipine  10 mg p.o. daily today -- Continue to hold home hydralazine  50mg  PO BID, isosorbide  mononitrate 20 mg PO BID, olmesartan  40 mg p.o. daily for now  CKD stage IIIa -- Cr 1.79>2.00>1.80> 1.64>1.28; stable (baseline 1.8-2.0) -- BMP in am   DVT prophylaxis: SCDs Start: 07/13/24 2335    Code Status: Do not attempt resuscitation (DNR) PRE-ARREST INTERVENTIONS DESIRED Family Communication: No family present at bedside this morning, updated patient's son via telephone on 12/5  Disposition Plan:  Level of care: Med-Surg Status is: Inpatient Remains inpatient appropriate because: IV antibiotics, awaiting culture sensitivities    Consultants:  None  Procedures:  None  Antimicrobials:  Ceftriaxone  12/4 - 12/6 Metronidazole  12/4 - 12/6 Unasyn  12/6>> Fosfomycin 12/6 x 1   Subjective: Patient seen examined bedside, lying in bed.  No family present.  Alert and oriented to self; remains pleasantly confused.  Unable to obtain any further ROS from patient given her underlying dementia.  Appears nontoxic, no acute distress.  No acute concerns overnight per nursing staff.  Objective: Vitals:   07/14/24 1002 07/14/24 1239 07/14/24 2059 07/15/24 0449  BP: (!) 145/48 (!) 139/55 (!) 133/56 (!) 156/72  Pulse: 69 66 61 63  Resp: (!) 23 18 19 19   Temp: 98.4 F (36.9 C) 98.8 F (37.1 C) 98.8 F (37.1 C) 98.7 F (37.1 C)  TempSrc: Axillary Axillary Oral Oral  SpO2: 100% 98% 97% 100%  Weight:      Height:        Intake/Output Summary (Last 24 hours) at  07/15/2024 1238 Last data filed at 07/15/2024 1104 Gross per 24 hour  Intake 2365.06 ml  Output 550 ml  Net 1815.06 ml   Filed Weights   07/14/24 0144 07/14/24 0801  Weight: 49.4 kg 49 kg    Examination:  Physical Exam: GEN: NAD, alert, oriented to self, pleasantly confused, chronically ill/elderly in appearance HEENT: NCAT, sclera clear, MMM PULM: CTAB w/o wheezes/crackles, normal respiratory effort, on room air CV: RRR w/o M/G/R GI: abd soft, NTND, + BS MSK: no peripheral edema, moves all extremities independently NEURO: No focal neurologic deficit other than baseline confusion Integumentary: No concerning rashes/lesions/wounds nonexposed skin surfaces    Data Reviewed: I have personally reviewed following labs and imaging studies  CBC: Recent Labs  Lab 07/13/24 1430 07/13/24 1453 07/13/24 2216 07/14/24 0017 07/15/24 0843  WBC 8.9  --   --  11.7* 10.1  NEUTROABS 7.3  --   --  9.7*  --   HGB 7.0* 8.2* 7.1* 6.5* 7.5*  HCT 22.4* 24.0* 21.0* 19.9* 23.3*  MCV 105.7*  --   --  104.2* 101.7*  PLT 302  --   --  270 213   Basic Metabolic Panel: Recent Labs  Lab 07/13/24 1430 07/13/24 1453 07/13/24 2216 07/14/24 0017 07/15/24 0843  NA 136 139 140 138 138  K 5.3* 5.3* 5.2* 5.4* 4.4  CL 111 112* 116* 114* 114*  CO2 16*  --   --  15*  16*  GLUCOSE 97 96 86 85 84  BUN 31* 30* 27* 27* 19  CREATININE 1.79* 2.00* 1.80* 1.64* 1.28*  CALCIUM  8.3*  --   --  8.1* 8.0*  MG  --   --   --   --  1.9  PHOS  --   --   --   --  2.4*   GFR: Estimated Creatinine Clearance: 21.2 mL/min (A) (by C-G formula based on SCr of 1.28 mg/dL (H)). Liver Function Tests: Recent Labs  Lab 07/13/24 1430 07/14/24 0017  AST 15 16  ALT 15 13  ALKPHOS 74 64  BILITOT 0.2 0.2  PROT 6.1* 5.5*  ALBUMIN 3.4* 3.1*   No results for input(s): LIPASE, AMYLASE in the last 168 hours. No results for input(s): AMMONIA in the last 168 hours. Coagulation Profile: No results for input(s): INR,  PROTIME in the last 168 hours. Cardiac Enzymes: No results for input(s): CKTOTAL, CKMB, CKMBINDEX, TROPONINI in the last 168 hours. BNP (last 3 results) Recent Labs    06/28/24 1850 07/13/24 1430  PROBNP 454.0* 460.0*   HbA1C: No results for input(s): HGBA1C in the last 72 hours. CBG: No results for input(s): GLUCAP in the last 168 hours. Lipid Profile: No results for input(s): CHOL, HDL, LDLCALC, TRIG, CHOLHDL, LDLDIRECT in the last 72 hours. Thyroid  Function Tests: No results for input(s): TSH, T4TOTAL, FREET4, T3FREE, THYROIDAB in the last 72 hours. Anemia Panel: Recent Labs    07/14/24 0017  VITAMINB12 403  FOLATE 5.5*  FERRITIN 148  TIBC NOT CALCULATED  IRON <10*  RETICCTPCT 2.1   Sepsis Labs: Recent Labs  Lab 07/13/24 1454 07/13/24 1633 07/14/24 0017 07/14/24 0530  LATICACIDVEN 0.4* <0.3* 0.4* 0.5    Recent Results (from the past 240 hours)  Culture, blood (routine x 2)     Status: None (Preliminary result)   Collection Time: 07/13/24  2:30 PM   Specimen: BLOOD  Result Value Ref Range Status   Specimen Description   Final    BLOOD BLOOD LEFT FOREARM Performed at [facility], [address], [place], [place] [address]    Special Requests   Final    BOTTLES DRAWN AEROBIC AND ANAEROBIC Blood Culture adequate volume Performed at [facility], [address] [place], [place] [address]    Culture   Final    NO GROWTH 2 DAYS Performed at [facility], [address], [place], [place] [address]    Report Status PENDING  Incomplete  Culture, blood (routine x 2)     Status: None (Preliminary result)   Collection Time: 07/13/24  2:38 PM   Specimen: BLOOD  Result Value Ref Range Status   Specimen Description   Final    BLOOD BLOOD RIGHT FOREARM Performed at [facility], [address] [place], [place] [address]    Special Requests   Final    BOTTLES DRAWN  AEROBIC AND ANAEROBIC Blood Culture adequate volume Performed at [facility], [address], [place], [place] [address]    Culture   Final    NO GROWTH 2 DAYS Performed at [facility], [address] [place], [place] [address]    Report Status PENDING  Incomplete  Urine Culture     Status: Abnormal (Preliminary result)   Collection Time: 07/13/24  8:42 PM   Specimen: Urine, Clean Catch  Result Value Ref Range Status   Specimen Description   Final  URINE, CLEAN CATCH Performed at [facility], [address] [place], [place] [address]    Special Requests   Final    NONE Reflexed from [id number] Performed at [facility], [address], [place], [place] [address]    Culture (A)  Final    50,000 COLONIES/mL CITROBACTER FARMERI 40,000 COLONIES/mL ENTEROCOCCUS GALLINARUM VANCOMYCIN RESISTANT ENTEROCOCCUS CITROBACTER FARMERI SUSCEPTIBILITIES TO FOLLOW    Report Status PENDING  Incomplete   Organism ID, Bacteria ENTEROCOCCUS GALLINARUM (A)  Final      Susceptibility   Enterococcus gallinarum - MIC*    AMPICILLIN  <=2 SENSITIVE Sensitive     NITROFURANTOIN 64 INTERMEDIATE Intermediate     VANCOMYCIN RESISTANT Resistant     * 40,000 COLONIES/mL ENTEROCOCCUS GALLINARUM  Urine Culture     Status: Abnormal (Preliminary result)   Collection Time: 07/14/24 12:47 AM   Specimen: Urine, Random  Result Value Ref Range Status   Specimen Description   Final    URINE, RANDOM Performed at [facility], [address] [place], [place] [address]    Special Requests   Final    NONE Reflexed from [id number] Performed at [facility], [address], [place], [place] [address]    Culture (A)  Final    50,000 COLONIES/mL GRAM NEGATIVE RODS 50,000 COLONIES/mL ENTEROCOCCUS FAECIUM SUSCEPTIBILITIES PERFORMED ON PREVIOUS CULTURE WITHIN THE LAST 5 DAYS. Performed at [facility], [address]  [place], [place] [address]    Report Status PENDING  Incomplete         Radiology Studies: CT Head Wo Contrast Result Date: 07/13/2024 CLINICAL DATA:  Altered mental status.  Found unconscious. EXAM: CT HEAD WITHOUT CONTRAST CT CERVICAL SPINE WITHOUT CONTRAST TECHNIQUE: Multidetector CT imaging of the head and cervical spine was performed following the standard protocol without intravenous contrast. Multiplanar CT image reconstructions of the cervical spine were also generated. RADIATION DOSE REDUCTION: This exam was performed according to the departmental dose-optimization program which includes automated exposure control, adjustment of the mA and/or kV according to patient size and/or use of iterative reconstruction technique. COMPARISON:  CT head/cervical spine 01/13/2024 FINDINGS: CT HEAD FINDINGS Brain: Ventricles, cisterns and other CSF spaces are within normal with mild age related atrophic change present. There is mild chronic ischemic microvascular disease. No mass, mass effect, shift of midline structures or acute hemorrhage. No evidence of acute infarction. Vascular: No hyperdense vessel or unexpected calcification. Skull: Normal. Negative for fracture or focal lesion. Sinuses/Orbits: No acute finding. Other: None. CT CERVICAL SPINE FINDINGS Alignment: Normal. Skull base and vertebrae: Vertebral body heights are maintained. There is mild to moderate spondylosis of the cervical spine to include uncovertebral joint spurring and facet arthropathy. Anterior fusion hardware at the C4-5 level is intact and unchanged. Anterior fusion hardware at the C6-7 level is unchanged and demonstrates continued evidence fracture of the right C7 vertebral body screw. No acute fracture. Moderate bilateral neural foraminal narrowing at the C3-4 level. Moderate right-sided neural foraminal narrowing at the C5-6 level Soft tissues and spinal canal: No prevertebral fluid or swelling. No visible canal hematoma. Disc levels:  Interbody fusion at the C4-5 and C6-7 levels as the remaining disc spaces are unremarkable. Upper chest: No acute findings. Other: None. IMPRESSION: 1. No acute intracranial findings. 2. Age related atrophic change and chronic ischemic microvascular disease. 3. No acute cervical spine injury. 4. Moderate spondylosis of the cervical spine with multilevel neural foraminal narrowing as described. 5. Anterior fusion hardware  at the C4-5 and C6-7 levels. Chronic stable fracture of the right C7 screw. Electronically Signed   By: Toribio Agreste M.D.   On: 07/13/2024 16:40   CT Cervical Spine Wo Contrast Result Date: 07/13/2024 CLINICAL DATA:  Altered mental status.  Found unconscious. EXAM: CT HEAD WITHOUT CONTRAST CT CERVICAL SPINE WITHOUT CONTRAST TECHNIQUE: Multidetector CT imaging of the head and cervical spine was performed following the standard protocol without intravenous contrast. Multiplanar CT image reconstructions of the cervical spine were also generated. RADIATION DOSE REDUCTION: This exam was performed according to the departmental dose-optimization program which includes automated exposure control, adjustment of the mA and/or kV according to patient size and/or use of iterative reconstruction technique. COMPARISON:  CT head/cervical spine 01/13/2024 FINDINGS: CT HEAD FINDINGS Brain: Ventricles, cisterns and other CSF spaces are within normal with mild age related atrophic change present. There is mild chronic ischemic microvascular disease. No mass, mass effect, shift of midline structures or acute hemorrhage. No evidence of acute infarction. Vascular: No hyperdense vessel or unexpected calcification. Skull: Normal. Negative for fracture or focal lesion. Sinuses/Orbits: No acute finding. Other: None. CT CERVICAL SPINE FINDINGS Alignment: Normal. Skull base and vertebrae: Vertebral body heights are maintained. There is mild to moderate spondylosis of the cervical spine to include uncovertebral joint  spurring and facet arthropathy. Anterior fusion hardware at the C4-5 level is intact and unchanged. Anterior fusion hardware at the C6-7 level is unchanged and demonstrates continued evidence fracture of the right C7 vertebral body screw. No acute fracture. Moderate bilateral neural foraminal narrowing at the C3-4 level. Moderate right-sided neural foraminal narrowing at the C5-6 level Soft tissues and spinal canal: No prevertebral fluid or swelling. No visible canal hematoma. Disc levels: Interbody fusion at the C4-5 and C6-7 levels as the remaining disc spaces are unremarkable. Upper chest: No acute findings. Other: None. IMPRESSION: 1. No acute intracranial findings. 2. Age related atrophic change and chronic ischemic microvascular disease. 3. No acute cervical spine injury. 4. Moderate spondylosis of the cervical spine with multilevel neural foraminal narrowing as described. 5. Anterior fusion hardware at the C4-5 and C6-7 levels. Chronic stable fracture of the right C7 screw. Electronically Signed   By: Toribio Agreste M.D.   On: 07/13/2024 16:40   CT CHEST ABDOMEN PELVIS WO CONTRAST Result Date: 07/13/2024 CLINICAL DATA:  Sepsis, altered mental status. EXAM: CT CHEST, ABDOMEN AND PELVIS WITHOUT CONTRAST TECHNIQUE: Multidetector CT imaging of the chest, abdomen and pelvis was performed following the standard protocol without IV contrast. RADIATION DOSE REDUCTION: This exam was performed according to the departmental dose-optimization program which includes automated exposure control, adjustment of the mA and/or kV according to patient size and/or use of iterative reconstruction technique. COMPARISON:  June 28, 2024, Dec 09, 2022 FINDINGS: Of note, the lack of intravenous contrast limits evaluation of the solid organ parenchyma and vascularity. CT CHEST FINDINGS Cardiovascular: Mild cardiomegaly. Trace pericardial effusion. Dense multi-vessel coronary atherosclerosis.No aortic aneurysm. Diffuse calcified  aortic atherosclerosis. Mediastinum/Nodes: No mediastinal mass.No mediastinal, hilar, or axillary lymphadenopathy. Lungs/Pleura: The midline trachea and bronchi are patent. Biapical pleuroparenchymal scarring. No focal airspace consolidation, pleural effusion, or pneumothorax. CT ABDOMEN PELVIS FINDINGS Hepatobiliary: No mass.Cholecystectomy. Moderate dilation of the intrahepatic and extrahepatic bile ducts, likely related to the prior cholecystectomy. Pancreas: No mass or main ductal dilation. No peripancreatic inflammation or fluid collection. Spleen: Normal size. No mass. Adrenals/Urinary Tract: No adrenal masses. No renal mass. No hydronephrosis or nephrolithiasis. The urinary bladder is distended without focal abnormality. Stomach/Bowel: The stomach  is decompressed without focal abnormality. Large bilobed periampullary duodenum diverticulum measuring 4.4 cm. No small bowel wall thickening or inflammation. No small bowel obstruction.Normal appendix. Total colonic diverticulosis. Subtle inflammatory stranding about the distal descending and sigmoid colon. No peridiverticular abscess. Vascular/Lymphatic: No aortic aneurysm. Diffuse aortoiliac atherosclerosis. No intraabdominal or pelvic lymphadenopathy. Reproductive: Hysterectomy. No concerning adnexal mass. No free pelvic fluid. Other: No pneumoperitoneum or ascites. Musculoskeletal: No acute fracture or destructive lesion. Osteopenia. Multilevel degenerative disc disease of the spine. Thoracic DISH. Anterior cervical fusion hardware. IMPRESSION: 1. Mild cardiomegaly with trace pericardial effusion. No pneumonia, pulmonary edema, or pleural effusion. 2. Total colonic diverticulosis. Subtle inflammatory stranding about the distal descending and sigmoid colon, which may reflect changes of an infectious or inflammatory colitis or acute diverticulitis. No peridiverticular abscess or pneumoperitoneum. Aortic Atherosclerosis (ICD10-I70.0). Electronically Signed   By:  Rogelia Myers M.D.   On: 07/13/2024 16:37   DG Tibia/Fibula Left Result Date: 07/13/2024 CLINICAL DATA:  Provided history of fall. EXAM: LEFT TIBIA AND FIBULA - 2 VIEW COMPARISON:  Knee radiograph 08/21/2023 FINDINGS: Plate and screw fixation of the proximal tibia. The hardware is intact. No periprosthetic lucency. No acute fracture of the lower leg. No knee or ankle dislocation. Peripheral vascular calcifications are seen. Mild soft tissue edema. IMPRESSION: 1. No acute fracture of the left lower leg. 2. Plate and screw fixation of the proximal tibia. Hardware is intact. Electronically Signed   By: Andrea Gasman M.D.   On: 07/13/2024 15:32   DG Pelvis 1-2 Views Result Date: 07/13/2024 CLINICAL DATA:  Provided history of fall. Dementia patient with altered mental status. EXAM: PELVIS - 1-2 VIEW COMPARISON:  01/13/2024 FINDINGS: The patient is rotated limiting assessment. Allowing for this, no evidence of acute fracture. No hip dislocation. The bones are subjectively under mineralized. Degenerative changes of the pubic symphysis and sacroiliac joints without diastasis. IMPRESSION: 1. No evidence of acute fracture or dislocation. 2. Degenerative changes of the pubic symphysis and sacroiliac joints. Electronically Signed   By: Andrea Gasman M.D.   On: 07/13/2024 15:30   DG Chest Port 1 View Result Date: 07/13/2024 CLINICAL DATA:  Provided history of fall. Dementia patient with altered mental status. EXAM: PORTABLE CHEST 1 VIEW COMPARISON:  Radiograph 06/28/2024 FINDINGS: The cardiomediastinal contours are normal. Aortic atherosclerosis. Pulmonary vasculature is normal. No consolidation, pleural effusion, or pneumothorax. Chronic calcified granuloma in the right lung. No acute osseous abnormalities are seen. IMPRESSION: No acute chest findings. Electronically Signed   By: Andrea Gasman M.D.   On: 07/13/2024 15:29        Scheduled Meds:  sodium chloride    Intravenous Once   brexpiprazole   0.5  mg Oral QHS   feeding supplement  237 mL Oral BID BM   folic acid   1 mg Oral Daily   levothyroxine   175 mcg Oral Q0600   memantine   10 mg Oral BID   multivitamin with minerals  1 tablet Oral Daily   pantoprazole   40 mg Oral QHS   senna-docusate  2 tablet Oral QHS   sertraline   100 mg Oral QHS   Continuous Infusions:  sodium chloride  75 mL/hr at 07/15/24 1104   cefTRIAXone  (ROCEPHIN )  IV Stopped (07/15/24 1020)   metronidazole  500 mg (07/15/24 1107)     LOS: 2 days    Time spent: 51 minutes spent on 07/15/2024 caring for this patient face-to-face including chart review, ordering labs/tests, documenting, discussion with nursing staff, consultants, updating family and interview/physical exam    Camellia PARAS  Bartolo Montanye, DO [facility] Available via Epic secure chat 7am-7pm After these hours, please refer to coverage provider listed on [email or web address] 07/15/2024, 12:38 PM

## 2024-07-16 LAB — BASIC METABOLIC PANEL WITH GFR
Anion gap: 9 (ref 5–15)
BUN: 16 mg/dL (ref 8–23)
CO2: 15 mmol/L — ABNORMAL LOW (ref 22–32)
Calcium: 8.1 mg/dL — ABNORMAL LOW (ref 8.9–10.3)
Chloride: 115 mmol/L — ABNORMAL HIGH (ref 98–111)
Creatinine, Ser: 1.22 mg/dL — ABNORMAL HIGH (ref 0.44–1.00)
GFR, Estimated: 41 mL/min — ABNORMAL LOW (ref >60)
Glucose, Bld: 82 mg/dL (ref 70–99)
Potassium: 4.2 mmol/L (ref 3.5–5.1)
Sodium: 139 mmol/L (ref 135–145)

## 2024-07-16 LAB — CBC
HCT: 23.9 % — ABNORMAL LOW (ref 36.0–46.0)
Hemoglobin: 7.6 g/dL — ABNORMAL LOW (ref 12.0–15.0)
MCH: 32.5 pg (ref 26.0–34.0)
MCHC: 31.8 g/dL (ref 30.0–36.0)
MCV: 102.1 fL — ABNORMAL HIGH (ref 80.0–100.0)
Platelets: 209 K/uL (ref 150–400)
RBC: 2.34 MIL/uL — ABNORMAL LOW (ref 3.87–5.11)
RDW: 16 % — ABNORMAL HIGH (ref 11.5–15.5)
WBC: 10.3 K/uL (ref 4.0–10.5)
nRBC: 0 % (ref 0.0–0.2)

## 2024-07-16 LAB — URINE CULTURE
Culture: 50000 — AB
Culture: 50000 — AB

## 2024-07-16 MED ORDER — FERROUS SULFATE 325 (65 FE) MG PO TABS: 325.0000 mg | ORAL_TABLET | Freq: Every day | ORAL | 3 refills | Status: AC

## 2024-07-16 MED ORDER — AMOXICILLIN-POT CLAVULANATE 875-125 MG PO TABS: 1.0000 | ORAL_TABLET | Freq: Two times a day (BID) | ORAL | 0 refills | Status: AC

## 2024-07-16 MED ORDER — FERROUS SULFATE 325 (65 FE) MG PO TABS
325.0000 mg | ORAL_TABLET | Freq: Every day | ORAL | Status: DC
  Administered 2024-07-16: 325 mg via ORAL
  Filled 2024-07-16: qty 1

## 2024-07-16 MED ORDER — SENNOSIDES-DOCUSATE SODIUM 8.6-50 MG PO TABS: 2.0000 | ORAL_TABLET | Freq: Every day | ORAL | 0 refills | Status: AC

## 2024-07-16 MED ORDER — FOLIC ACID 1 MG PO TABS: 1.0000 mg | ORAL_TABLET | Freq: Every day | ORAL | 0 refills | Status: AC

## 2024-07-16 NOTE — Discharge Summary (Addendum)
 Physician Discharge Summary   Patient: [name] MRN: [id number] DOB: [birth year]  Admit date:     07/13/2024  Discharge date: 07/16/24  Discharge Physician: Owen DELENA Lore   PCP: Marston Junette HERO, MD   Recommendations at discharge:    Follow up resolution of diverticulitis.  Monitor BP resume hydralazine  as needed.  Encourage oral intake.   Discharge Diagnoses: Principal Problem:   Unresponsive episode Active Problems:   Dementia without behavioral disturbance (HCC)   Hypothyroidism   Anemia of chronic disease   CKD (chronic kidney disease) stage 3, GFR 30-59 ml/min (HCC)   GERD (gastroesophageal reflux disease)   Essential hypertension   Cystitis   Diverticulitis   Sepsis (HCC)   Protein-calorie malnutrition, severe  Resolved Problems:   * No resolved hospital problems. *  Hospital Course: 89 year old with past medical history significant for Lewy body dementia, hypothyroidism, hypertension, CKD stage IIIa, anemia of chronic medical disease, history of SAH status post fall, chronic fatigue who presents to our office/11/2023 from [facility] due to altered mental status with unresponsive episode at her nursing home facility.   Patient had a recent hospitalization for acute diverticulitis and was treated with IV antibiotics during that hospitalization she was noted to be anemic without any active source for bleeding.  She was subsequently discharged back to her facility.   She presented with a hemoglobin of 7, potassium 5.3, CO2 16, creatinine 1.7, UA with large leukocytes greater than 50 white blood cell.  Chest x-ray no acute cardiopulmonary disease.  Pelvis x-ray no evidence of acute fracture or dislocation.  Left tibia-fibula x-ray with no acute fracture of the left lower leg, plate and screw fixation of the proximal tibia with intact hardware.  CT head with no acute intracranial finding CT C-spine with no acute cervical spine injury anterior fusion hardware C4 C6-7  levels chronic stable fracture C7 screw.  CT chest abdomen and pelvis with mild cardiomegaly trace pericardial effusion no pneumonia.  Diverticulosis with's inflammation in the distal descending and sigmoid colon consistent with diverticulitis no peridiverticular abscess or pneumoperitoneum.    Assessment and Plan: 1-Acute metabolic encephalopathy POA Urinary tract infection, Acute diverticulitis, Patient presented with confusion and unresponsive episode, afebrile.  UA consistent with UTI.  CT chest abdomen and pelvis finding consistent with inflammatory stranding distal descending colon concerning for diverticulitis. Blood cultures no growth to date, urine culture grew Citrobacter and Enterococcus Received IV fluids. Received a dose of fosfomycin.  She has been transition to Unasyn .  Will discharge on Augmentin  for 7 Received  a dose of fosfomycin to cover for citrobacter.  Discharge on Augmentin  for 7 days to cover for Both diverticulitis and UTI   Acute on chronic anemia folic acid  the patient's Iron deficiency anemia. Discharged on iron supplement and folic acid  supplements. Received 1 unit of packed red blood cell Discharged on folic acid  and iron supplement Hemoglobin increased today to 7.6  Hyperkalemia Resolved In the setting of AKI     Hypothyroidism; continue Synthroid    Lewy body dementia Continue Namenda  Zoloft , Rexulti    Hypertension Resume Norvasc  and Imdur .  Will hold hydralazine  for now  CKD stage IIIa Creatinine baseline 1.8--- 2.0 Stable       Wound 07/14/24 0130 Pressure Injury Sacrum Stage 2 -  Partial thickness loss of dermis presenting as a shallow open injury with a red, pink wound bed without slough. (Active)        Nutrition Problem: Severe Malnutrition Etiology: chronic illness (dementia)  Signs/Symptoms: severe fat depletion, severe muscle depletion       Interventions: Ensure Enlive (each supplement provides 350kcal and 20 grams of  protein), MVI   Estimated body mass index is 19.14 kg/m as calculated from the following:   Height as of this encounter: 5' 3 (1.6 m).   Weight as of this encounter: 49 kg.         Consultants: None Procedures performed: none Disposition: Skilled nursing facility Diet recommendation:  Discharge Diet Orders (From admission, onward)     Start     Ordered   07/16/24 0000  Diet - low sodium heart healthy        07/16/24 1119           Regular diet DISCHARGE MEDICATION: Allergies as of 07/16/2024       Reactions   Lactose Intolerance (gi) Other (See Comments)   Reaction not stated on MAR   Oxycodone Hcl Itching        Medication List     STOP taking these medications    hydrALAZINE  50 MG tablet Commonly known as: APRESOLINE    mirtazapine 15 MG tablet Commonly known as: REMERON   olmesartan  40 MG tablet Commonly known as: BENICAR    polyethylene glycol 17 g packet Commonly known as: MIRALAX  / GLYCOLAX    QUEtiapine  50 MG tablet Commonly known as: SEROQUEL        TAKE these medications    amLODipine  10 MG tablet Commonly known as: NORVASC  TAKE 1 TABLET BY MOUTH DAILY   amoxicillin -clavulanate 875-125 MG tablet Commonly known as: AUGMENTIN  Take 1 tablet by mouth 2 (two) times daily for 7 days.   ferrous sulfate  325 (65 FE) MG tablet Take 1 tablet (325 mg total) by mouth daily with breakfast.   folic acid  1 MG tablet Commonly known as: FOLVITE  Take 1 tablet (1 mg total) by mouth daily. Start taking on: July 17, 2024   isosorbide  mononitrate 10 MG tablet Commonly known as: ISMO  Take 20 mg by mouth 2 (two) times daily. Hold for SBP<110   levothyroxine  175 MCG tablet Commonly known as: SYNTHROID  Take 175 mcg by mouth daily before breakfast.   memantine  10 MG tablet Commonly known as: NAMENDA  TAKE 1 TABLET BY MOUTH IN THE  MORNING AND AT BEDTIME What changed: See the new instructions.   multivitamin tablet Take 1 tablet by mouth See  admin instructions. Take 1 tablet by mouth once a day with food   multivitamins with iron Tabs tablet Take 1 tablet by mouth daily.   pantoprazole  40 MG tablet Commonly known as: PROTONIX  Take 40 mg by mouth at bedtime.   PreserVision AREDS Tabs Take 1 tablet by mouth daily with lunch.   Rexulti  0.5 MG Tabs Generic drug: Brexpiprazole  TAKE 1 TABLET BY MOUTH AT NIGHT What changed:  how much to take how to take this when to take this additional instructions   senna-docusate 8.6-50 MG tablet Commonly known as: Senokot-S Take 2 tablets by mouth at bedtime.   sertraline  100 MG tablet Commonly known as: Zoloft  Take 1 tablet (100 mg total) by mouth in the morning and at bedtime.   TYLENOL  500 MG tablet Generic drug: acetaminophen  Take 500 mg by mouth in the morning and at bedtime.   Vitamin D  (Ergocalciferol ) 1.25 MG (50000 UNIT) Caps capsule Commonly known as: DRISDOL Take 50,000 Units by mouth every 7 (seven) days. Takes on Fridays        Discharge Exam: Filed Weights   07/14/24 0144 07/14/24 0801  Weight: 49.4 kg 49 kg   General; NAD  Condition at discharge: stable  The results of significant diagnostics from this hospitalization (including imaging, microbiology, ancillary and laboratory) are listed below for reference.   Imaging Studies: CT Head Wo Contrast Result Date: 07/13/2024 CLINICAL DATA:  Altered mental status.  Found unconscious. EXAM: CT HEAD WITHOUT CONTRAST CT CERVICAL SPINE WITHOUT CONTRAST TECHNIQUE: Multidetector CT imaging of the head and cervical spine was performed following the standard protocol without intravenous contrast. Multiplanar CT image reconstructions of the cervical spine were also generated. RADIATION DOSE REDUCTION: This exam was performed according to the departmental dose-optimization program which includes automated exposure control, adjustment of the mA and/or kV according to patient size and/or use of iterative reconstruction  technique. COMPARISON:  CT head/cervical spine 01/13/2024 FINDINGS: CT HEAD FINDINGS Brain: Ventricles, cisterns and other CSF spaces are within normal with mild age related atrophic change present. There is mild chronic ischemic microvascular disease. No mass, mass effect, shift of midline structures or acute hemorrhage. No evidence of acute infarction. Vascular: No hyperdense vessel or unexpected calcification. Skull: Normal. Negative for fracture or focal lesion. Sinuses/Orbits: No acute finding. Other: None. CT CERVICAL SPINE FINDINGS Alignment: Normal. Skull base and vertebrae: Vertebral body heights are maintained. There is mild to moderate spondylosis of the cervical spine to include uncovertebral joint spurring and facet arthropathy. Anterior fusion hardware at the C4-5 level is intact and unchanged. Anterior fusion hardware at the C6-7 level is unchanged and demonstrates continued evidence fracture of the right C7 vertebral body screw. No acute fracture. Moderate bilateral neural foraminal narrowing at the C3-4 level. Moderate right-sided neural foraminal narrowing at the C5-6 level Soft tissues and spinal canal: No prevertebral fluid or swelling. No visible canal hematoma. Disc levels: Interbody fusion at the C4-5 and C6-7 levels as the remaining disc spaces are unremarkable. Upper chest: No acute findings. Other: None. IMPRESSION: 1. No acute intracranial findings. 2. Age related atrophic change and chronic ischemic microvascular disease. 3. No acute cervical spine injury. 4. Moderate spondylosis of the cervical spine with multilevel neural foraminal narrowing as described. 5. Anterior fusion hardware at the C4-5 and C6-7 levels. Chronic stable fracture of the right C7 screw. Electronically Signed   By: Toribio Agreste M.D.   On: 07/13/2024 16:40   CT Cervical Spine Wo Contrast Result Date: 07/13/2024 CLINICAL DATA:  Altered mental status.  Found unconscious. EXAM: CT HEAD WITHOUT CONTRAST CT CERVICAL  SPINE WITHOUT CONTRAST TECHNIQUE: Multidetector CT imaging of the head and cervical spine was performed following the standard protocol without intravenous contrast. Multiplanar CT image reconstructions of the cervical spine were also generated. RADIATION DOSE REDUCTION: This exam was performed according to the departmental dose-optimization program which includes automated exposure control, adjustment of the mA and/or kV according to patient size and/or use of iterative reconstruction technique. COMPARISON:  CT head/cervical spine 01/13/2024 FINDINGS: CT HEAD FINDINGS Brain: Ventricles, cisterns and other CSF spaces are within normal with mild age related atrophic change present. There is mild chronic ischemic microvascular disease. No mass, mass effect, shift of midline structures or acute hemorrhage. No evidence of acute infarction. Vascular: No hyperdense vessel or unexpected calcification. Skull: Normal. Negative for fracture or focal lesion. Sinuses/Orbits: No acute finding. Other: None. CT CERVICAL SPINE FINDINGS Alignment: Normal. Skull base and vertebrae: Vertebral body heights are maintained. There is mild to moderate spondylosis of the cervical spine to include uncovertebral joint spurring and facet arthropathy. Anterior fusion hardware at the C4-5 level is intact and  unchanged. Anterior fusion hardware at the C6-7 level is unchanged and demonstrates continued evidence fracture of the right C7 vertebral body screw. No acute fracture. Moderate bilateral neural foraminal narrowing at the C3-4 level. Moderate right-sided neural foraminal narrowing at the C5-6 level Soft tissues and spinal canal: No prevertebral fluid or swelling. No visible canal hematoma. Disc levels: Interbody fusion at the C4-5 and C6-7 levels as the remaining disc spaces are unremarkable. Upper chest: No acute findings. Other: None. IMPRESSION: 1. No acute intracranial findings. 2. Age related atrophic change and chronic ischemic  microvascular disease. 3. No acute cervical spine injury. 4. Moderate spondylosis of the cervical spine with multilevel neural foraminal narrowing as described. 5. Anterior fusion hardware at the C4-5 and C6-7 levels. Chronic stable fracture of the right C7 screw. Electronically Signed   By: Toribio Agreste M.D.   On: 07/13/2024 16:40   CT CHEST ABDOMEN PELVIS WO CONTRAST Result Date: 07/13/2024 CLINICAL DATA:  Sepsis, altered mental status. EXAM: CT CHEST, ABDOMEN AND PELVIS WITHOUT CONTRAST TECHNIQUE: Multidetector CT imaging of the chest, abdomen and pelvis was performed following the standard protocol without IV contrast. RADIATION DOSE REDUCTION: This exam was performed according to the departmental dose-optimization program which includes automated exposure control, adjustment of the mA and/or kV according to patient size and/or use of iterative reconstruction technique. COMPARISON:  June 28, 2024, Dec 09, 2022 FINDINGS: Of note, the lack of intravenous contrast limits evaluation of the solid organ parenchyma and vascularity. CT CHEST FINDINGS Cardiovascular: Mild cardiomegaly. Trace pericardial effusion. Dense multi-vessel coronary atherosclerosis.No aortic aneurysm. Diffuse calcified aortic atherosclerosis. Mediastinum/Nodes: No mediastinal mass.No mediastinal, hilar, or axillary lymphadenopathy. Lungs/Pleura: The midline trachea and bronchi are patent. Biapical pleuroparenchymal scarring. No focal airspace consolidation, pleural effusion, or pneumothorax. CT ABDOMEN PELVIS FINDINGS Hepatobiliary: No mass.Cholecystectomy. Moderate dilation of the intrahepatic and extrahepatic bile ducts, likely related to the prior cholecystectomy. Pancreas: No mass or main ductal dilation. No peripancreatic inflammation or fluid collection. Spleen: Normal size. No mass. Adrenals/Urinary Tract: No adrenal masses. No renal mass. No hydronephrosis or nephrolithiasis. The urinary bladder is distended without focal  abnormality. Stomach/Bowel: The stomach is decompressed without focal abnormality. Large bilobed periampullary duodenum diverticulum measuring 4.4 cm. No small bowel wall thickening or inflammation. No small bowel obstruction.Normal appendix. Total colonic diverticulosis. Subtle inflammatory stranding about the distal descending and sigmoid colon. No peridiverticular abscess. Vascular/Lymphatic: No aortic aneurysm. Diffuse aortoiliac atherosclerosis. No intraabdominal or pelvic lymphadenopathy. Reproductive: Hysterectomy. No concerning adnexal mass. No free pelvic fluid. Other: No pneumoperitoneum or ascites. Musculoskeletal: No acute fracture or destructive lesion. Osteopenia. Multilevel degenerative disc disease of the spine. Thoracic DISH. Anterior cervical fusion hardware. IMPRESSION: 1. Mild cardiomegaly with trace pericardial effusion. No pneumonia, pulmonary edema, or pleural effusion. 2. Total colonic diverticulosis. Subtle inflammatory stranding about the distal descending and sigmoid colon, which may reflect changes of an infectious or inflammatory colitis or acute diverticulitis. No peridiverticular abscess or pneumoperitoneum. Aortic Atherosclerosis (ICD10-I70.0). Electronically Signed   By: Rogelia Myers M.D.   On: 07/13/2024 16:37   DG Tibia/Fibula Left Result Date: 07/13/2024 CLINICAL DATA:  Provided history of fall. EXAM: LEFT TIBIA AND FIBULA - 2 VIEW COMPARISON:  Knee radiograph 08/21/2023 FINDINGS: Plate and screw fixation of the proximal tibia. The hardware is intact. No periprosthetic lucency. No acute fracture of the lower leg. No knee or ankle dislocation. Peripheral vascular calcifications are seen. Mild soft tissue edema. IMPRESSION: 1. No acute fracture of the left lower leg. 2. Plate and screw fixation of the  proximal tibia. Hardware is intact. Electronically Signed   By: Andrea Gasman M.D.   On: 07/13/2024 15:32   DG Pelvis 1-2 Views Result Date: 07/13/2024 CLINICAL DATA:   Provided history of fall. Dementia patient with altered mental status. EXAM: PELVIS - 1-2 VIEW COMPARISON:  01/13/2024 FINDINGS: The patient is rotated limiting assessment. Allowing for this, no evidence of acute fracture. No hip dislocation. The bones are subjectively under mineralized. Degenerative changes of the pubic symphysis and sacroiliac joints without diastasis. IMPRESSION: 1. No evidence of acute fracture or dislocation. 2. Degenerative changes of the pubic symphysis and sacroiliac joints. Electronically Signed   By: Andrea Gasman M.D.   On: 07/13/2024 15:30   DG Chest Port 1 View Result Date: 07/13/2024 CLINICAL DATA:  Provided history of fall. Dementia patient with altered mental status. EXAM: PORTABLE CHEST 1 VIEW COMPARISON:  Radiograph 06/28/2024 FINDINGS: The cardiomediastinal contours are normal. Aortic atherosclerosis. Pulmonary vasculature is normal. No consolidation, pleural effusion, or pneumothorax. Chronic calcified granuloma in the right lung. No acute osseous abnormalities are seen. IMPRESSION: No acute chest findings. Electronically Signed   By: Andrea Gasman M.D.   On: 07/13/2024 15:29   CT Renal Stone Study Result Date: 06/28/2024 CLINICAL DATA:  Abdominal and flank pain. EXAM: CT ABDOMEN AND PELVIS WITHOUT CONTRAST TECHNIQUE: Multidetector CT imaging of the abdomen and pelvis was performed following the standard protocol without IV contrast. RADIATION DOSE REDUCTION: This exam was performed according to the departmental dose-optimization program which includes automated exposure control, adjustment of the mA and/or kV according to patient size and/or use of iterative reconstruction technique. COMPARISON:  CT chest abdomen and pelvis 12/09/2022. FINDINGS: Lower chest: No acute abnormality. Hepatobiliary: No focal liver abnormality is seen. Status post cholecystectomy. No biliary dilatation. There are calcified granulomas in the liver. Pancreas: Unremarkable. No pancreatic  ductal dilatation or surrounding inflammatory changes. Spleen: Normal in size without focal abnormality. There are calcified granulomas in the spleen. Adrenals/Urinary Tract: Adrenal glands are unremarkable. Kidneys are normal, without renal calculi, focal lesion, or hydronephrosis. Bladder is unremarkable. Stomach/Bowel: There is diffuse colonic diverticulosis. There is wall thickening and mild inflammation of the sigmoid colon and mid descending colon worrisome for acute diverticulitis. There is no evidence for perforation or abscess. There is no bowel obstruction. The appendix is not seen. Stomach and small bowel loops are within normal limits. Vascular/Lymphatic: There are atherosclerotic calcifications of the aorta, branch vessels and iliac arteries. There is a calcified splenic artery aneurysm near the hilum of the spleen measuring 11 mm which is unchanged from 2024. No enlarged lymph nodes are identified. Reproductive: Status post hysterectomy. No adnexal masses. Other: No abdominal wall hernia or abnormality. No abdominopelvic ascites. Musculoskeletal: The bones are diffusely osteopenic. Degenerative changes affect the spine. L3 vertebral body hemangioma present. IMPRESSION: 1. Acute uncomplicated diverticulitis of the sigmoid colon and mid descending colon. 2. Aortic atherosclerosis. Aortic Atherosclerosis (ICD10-I70.0). Electronically Signed   By: Greig Pique M.D.   On: 06/28/2024 21:42   CT HEAD WO CONTRAST ( ) Result Date: 06/28/2024 EXAM: CT HEAD WITHOUT CONTRAST 06/28/2024 09:16:00 PM TECHNIQUE: CT of the head was performed without the administration of intravenous contrast. Automated exposure control, iterative reconstruction, and/or weight based adjustment of the mA/kV was utilized to reduce the radiation dose to as low as reasonably achievable. COMPARISON: 01/13/2024 CLINICAL HISTORY: Neuro deficit, acute, stroke suspected. FINDINGS: BRAIN AND VENTRICLES: No acute hemorrhage. No evidence of  acute infarct. Proportional prominence of ventricles and sulci, consistent with diffuse cerebral parenchymal  volume loss. Periventricular and subcortical white matter hypoattenuation, consistent with moderate chronic ischemic microvascular disease. Calcified atherosclerotic plaque in cavernous/supraclinoid internal carotid arteries. No hydrocephalus. No extra-axial collection. No mass effect or midline shift. ORBITS: Bilateral lens replacement. SINUSES: No acute abnormality. SOFT TISSUES AND SKULL: No acute soft tissue abnormality. No skull fracture. IMPRESSION: 1. No acute intracranial abnormality. 2. Diffuse cerebral parenchymal volume loss. 3. Moderate chronic ischemic microvascular disease. Electronically signed by: Donnice Mania MD 06/28/2024 09:41 PM EST RP Workstation: [id number]   DG Chest Port 1 View Result Date: 06/28/2024 EXAM: 1 VIEW(S) XRAY OF THE CHEST 06/28/2024 06:50:00 PM COMPARISON: 01/13/2024 CLINICAL HISTORY: AMS FINDINGS: LINES, TUBES AND DEVICES: Right upper quadrant surgical clips noted. ACDF hardware in lower cervical spine. LUNGS AND PLEURA: Superior right upper lung benign calcified granuloma. No pleural effusion. No pneumothorax. HEART AND MEDIASTINUM: Moderate atherosclerotic calcifications in aortic arch. BONES AND SOFT TISSUES: Moderate multilevel degenerative disc changes of thoracic spine. No acute osseous abnormality. IMPRESSION: 1. No acute cardiopulmonary process. Electronically signed by: Morgane Naveau MD 06/28/2024 07:34 PM EST RP Workstation: [id number]    Microbiology: Results for orders placed or performed during the hospital encounter of 07/13/24  Culture, blood (routine x 2)     Status: None (Preliminary result)   Collection Time: 07/13/24  2:30 PM   Specimen: BLOOD  Result Value Ref Range Status   Specimen Description   Final    BLOOD BLOOD LEFT FOREARM Performed at [facility], [address], [place], [place] [address]    Special  Requests   Final    BOTTLES DRAWN AEROBIC AND ANAEROBIC Blood Culture adequate volume Performed at [facility], [address], [place], [place] [address]    Culture   Final    NO GROWTH 3 DAYS Performed at [facility], [address], [place], [place] [address]    Report Status PENDING  Incomplete  Culture, blood (routine x 2)     Status: None (Preliminary result)   Collection Time: 07/13/24  2:38 PM   Specimen: BLOOD  Result Value Ref Range Status   Specimen Description   Final    BLOOD BLOOD RIGHT FOREARM Performed at [facility], [address] [place], [place] [address]    Special Requests   Final    BOTTLES DRAWN AEROBIC AND ANAEROBIC Blood Culture adequate volume Performed at [facility], [address] [place], [place] [address]    Culture   Final    NO GROWTH 3 DAYS Performed at [facility], [address] [place], [place] [address]    Report Status PENDING  Incomplete  Urine Culture     Status: Abnormal   Collection Time: 07/13/24  8:42 PM   Specimen: Urine, Clean Catch  Result Value Ref Range Status   Specimen Description   Final    URINE, CLEAN CATCH Performed at [facility], [address] [place], [place] [address]    Special Requests   Final    NONE Reflexed from [id number] Performed at [facility], [address], [place], [place] [address]    Culture (A)  Final    50,000 COLONIES/mL CITROBACTER FARMERI 40,000 COLONIES/mL ENTEROCOCCUS GALLINARUM VANCOMYCIN RESISTANT ENTEROCOCCUS    Report Status 07/16/2024 FINAL  Final   Organism ID, Bacteria ENTEROCOCCUS GALLINARUM (A)  Final   Organism ID, Bacteria CITROBACTER FARMERI (A)  Final      Susceptibility   Citrobacter farmeri -  MIC*    CEFEPIME <=0.12 SENSITIVE Sensitive     ERTAPENEM <=0.12 SENSITIVE Sensitive     CEFTRIAXONE  >=64 RESISTANT Resistant     CIPROFLOXACIN  <=0.06 SENSITIVE Sensitive      GENTAMICIN <=1 SENSITIVE Sensitive     NITROFURANTOIN 32 SENSITIVE Sensitive     TRIMETH/SULFA <=20 SENSITIVE Sensitive     PIP/TAZO Value in next row Sensitive      <=4 SENSITIVEThis is a modified FDA-approved test that has been validated and its performance characteristics determined by the reporting laboratory.  This laboratory is certified under the Clinical Laboratory Improvement Amendments CLIA as qualified to perform high complexity clinical laboratory testing.    MEROPENEM Value in next row Sensitive      <=4 SENSITIVEThis is a modified FDA-approved test that has been validated and its performance characteristics determined by the reporting laboratory.  This laboratory is certified under the Clinical Laboratory Improvement Amendments CLIA as qualified to perform high complexity clinical laboratory testing.    * 50,000 COLONIES/mL CITROBACTER FARMERI   Enterococcus gallinarum - MIC*    AMPICILLIN  Value in next row Sensitive      <=4 SENSITIVEThis is a modified FDA-approved test that has been validated and its performance characteristics determined by the reporting laboratory.  This laboratory is certified under the Clinical Laboratory Improvement Amendments CLIA as qualified to perform high complexity clinical laboratory testing.    NITROFURANTOIN Value in next row Intermediate      <=4 SENSITIVEThis is a modified FDA-approved test that has been validated and its performance characteristics determined by the reporting laboratory.  This laboratory is certified under the Clinical Laboratory Improvement Amendments CLIA as qualified to perform high complexity clinical laboratory testing.    VANCOMYCIN Value in next row Resistant      <=4 SENSITIVEThis is a modified FDA-approved test that has been validated and its performance characteristics determined by the reporting laboratory.  This laboratory is certified under the Clinical Laboratory Improvement Amendments CLIA as qualified to perform high  complexity clinical laboratory testing.    * 40,000 COLONIES/mL ENTEROCOCCUS GALLINARUM  Urine Culture     Status: Abnormal   Collection Time: 07/14/24 12:47 AM   Specimen: Urine, Random  Result Value Ref Range Status   Specimen Description   Final    URINE, RANDOM Performed at [facility], [address] [place], [place] [address]    Special Requests   Final    NONE Reflexed from [id number] Performed at [facility], [address], [place], [place] [address]    Culture (A)  Final    50,000 COLONIES/mL GRAM NEGATIVE RODS 50,000 COLONIES/mL ENTEROCOCCUS FAECIUM SUSCEPTIBILITIES PERFORMED ON PREVIOUS CULTURE WITHIN THE LAST 5 DAYS. Performed at [facility], [address] [place], [place] [address]    Report Status 07/16/2024 FINAL  Final    Labs: CBC: Recent Labs  Lab 07/13/24 1430 07/13/24 1453 07/13/24 2216 07/14/24 0017 07/15/24 0843 07/16/24 0713  WBC 8.9  --   --  11.7* 10.1 10.3  NEUTROABS 7.3  --   --  9.7*  --   --   HGB 7.0* 8.2* 7.1* 6.5* 7.5* 7.6*  HCT 22.4* 24.0* 21.0* 19.9* 23.3* 23.9*  MCV 105.7*  --   --  104.2* 101.7* 102.1*  PLT 302  --   --  270 213 209   Basic Metabolic Panel: Recent Labs  Lab 07/13/24 1430 07/13/24 1453 07/13/24 2216 07/14/24 0017 07/15/24 0843 07/16/24 0542  NA 136  139 140 138 138 139  K 5.3* 5.3* 5.2* 5.4* 4.4 4.2  CL 111 112* 116* 114* 114* 115*  CO2 16*  --   --  15* 16* 15*  GLUCOSE 97 96 86 85 84 82  BUN 31* 30* 27* 27* 19 16  CREATININE 1.79* 2.00* 1.80* 1.64* 1.28* 1.22*  CALCIUM  8.3*  --   --  8.1* 8.0* 8.1*  MG  --   --   --   --  1.9  --   PHOS  --   --   --   --  2.4*  --    Liver Function Tests: Recent Labs  Lab 07/13/24 1430 07/14/24 0017  AST 15 16  ALT 15 13  ALKPHOS 74 64  BILITOT 0.2 0.2  PROT 6.1* 5.5*  ALBUMIN 3.4* 3.1*   CBG: No results for input(s): GLUCAP in the last 168 hours.  Discharge time spent: greater than 30  minutes.  Signed: Owen DELENA Lore, MD [facility] 07/16/2024

## 2024-07-16 NOTE — TOC Progression Note (Addendum)
 Transition of Care [facility]) - Progression Note    Patient Details  Name: [name] MRN: [id number] Date of Birth: 11/10/1928  [facility] CM/SW Contact  Lorraine LILLETTE Fenton, LCSW Phone Number: 07/16/2024, 12:44 PM  Clinical Narrative:    Pt has been clear for discharge and return to [facility] is the plan.  CSW contacted [facility] at [facility]- Provided call report information, CSW shared with RN then contacted son to advise.  Medical necessity completed and pt placed on transportation list. Discharge packet information completed.  No further ICM needs.    Expected Discharge Plan: Memory Care Barriers to Discharge: No Barriers Identified               Expected Discharge Plan and Services   Discharge Planning Services: CM Consult Post Acute Care Choice:  (memory care) Living arrangements for the past 2 months: Skilled Nursing Facility [facility]memory care) Expected Discharge Date: 07/16/24                                     Social Drivers of Health (SDOH) Interventions SDOH Screenings   Food Insecurity: No Food Insecurity (06/05/2022)  Housing: Low Risk  (09/25/2020)  Transportation Needs: No Transportation Needs (06/05/2022)  Alcohol  Screen: Low Risk  (09/25/2020)  Depression (PHQ2-9): Low Risk  (06/05/2022)  Financial Resource Strain: Low Risk  (06/05/2022)  Physical Activity: Inactive (06/05/2022)  Social Connections: Patient Unable To Answer (07/14/2024)  Stress: Stress Concern Present (06/05/2022)  Tobacco Use: Medium Risk (07/13/2024)    Readmission Risk Interventions    06/29/2024   11:26 AM  Readmission Risk Prevention Plan  Transportation Screening Complete  PCP or Specialist Appt within 5-7 Days Complete  Home Care Screening Complete  Medication Review (RN CM) Complete

## 2024-07-17 DIAGNOSIS — G3183 Dementia with Lewy bodies: Secondary | ICD-10-CM | POA: Diagnosis not present

## 2024-07-17 DIAGNOSIS — L89152 Pressure ulcer of sacral region, stage 2: Secondary | ICD-10-CM | POA: Diagnosis not present

## 2024-07-17 DIAGNOSIS — E43 Unspecified severe protein-calorie malnutrition: Secondary | ICD-10-CM | POA: Diagnosis not present

## 2024-07-17 DIAGNOSIS — N390 Urinary tract infection, site not specified: Secondary | ICD-10-CM | POA: Diagnosis not present

## 2024-07-17 DIAGNOSIS — G9341 Metabolic encephalopathy: Secondary | ICD-10-CM | POA: Diagnosis not present

## 2024-07-17 DIAGNOSIS — F0283 Dementia in other diseases classified elsewhere, unspecified severity, with mood disturbance: Secondary | ICD-10-CM | POA: Diagnosis not present

## 2024-07-17 DIAGNOSIS — R2681 Unsteadiness on feet: Secondary | ICD-10-CM | POA: Diagnosis not present

## 2024-07-17 DIAGNOSIS — R41841 Cognitive communication deficit: Secondary | ICD-10-CM | POA: Diagnosis not present

## 2024-07-17 DIAGNOSIS — Z681 Body mass index (BMI) 19 or less, adult: Secondary | ICD-10-CM | POA: Diagnosis not present

## 2024-07-17 DIAGNOSIS — R2689 Other abnormalities of gait and mobility: Secondary | ICD-10-CM | POA: Diagnosis not present

## 2024-07-17 DIAGNOSIS — N184 Chronic kidney disease, stage 4 (severe): Secondary | ICD-10-CM | POA: Diagnosis not present

## 2024-07-17 DIAGNOSIS — K5732 Diverticulitis of large intestine without perforation or abscess without bleeding: Secondary | ICD-10-CM | POA: Diagnosis not present

## 2024-07-17 DIAGNOSIS — D464 Refractory anemia, unspecified: Secondary | ICD-10-CM | POA: Diagnosis not present

## 2024-07-17 LAB — TYPE AND SCREEN
ABO/RH(D): O POS
Antibody Screen: NEGATIVE
Unit division: 0

## 2024-07-17 LAB — BPAM RBC
Blood Product Expiration Date: 202601032359
ISSUE DATE / TIME: 202512050940
Unit Type and Rh: 5100

## 2024-07-18 LAB — CULTURE, BLOOD (ROUTINE X 2)
Culture: NO GROWTH
Culture: NO GROWTH
Special Requests: ADEQUATE
Special Requests: ADEQUATE

## 2024-07-19 DIAGNOSIS — G9341 Metabolic encephalopathy: Secondary | ICD-10-CM | POA: Diagnosis not present

## 2024-07-19 DIAGNOSIS — R2681 Unsteadiness on feet: Secondary | ICD-10-CM | POA: Diagnosis not present

## 2024-07-19 DIAGNOSIS — D464 Refractory anemia, unspecified: Secondary | ICD-10-CM | POA: Diagnosis not present

## 2024-07-19 DIAGNOSIS — R2689 Other abnormalities of gait and mobility: Secondary | ICD-10-CM | POA: Diagnosis not present

## 2024-07-19 DIAGNOSIS — R41841 Cognitive communication deficit: Secondary | ICD-10-CM | POA: Diagnosis not present

## 2024-07-19 DIAGNOSIS — K5732 Diverticulitis of large intestine without perforation or abscess without bleeding: Secondary | ICD-10-CM | POA: Diagnosis not present

## 2024-07-19 DIAGNOSIS — N390 Urinary tract infection, site not specified: Secondary | ICD-10-CM | POA: Diagnosis not present

## 2024-07-26 DIAGNOSIS — R41841 Cognitive communication deficit: Secondary | ICD-10-CM | POA: Diagnosis not present

## 2024-07-26 DIAGNOSIS — N390 Urinary tract infection, site not specified: Secondary | ICD-10-CM | POA: Diagnosis not present

## 2024-07-26 DIAGNOSIS — G9341 Metabolic encephalopathy: Secondary | ICD-10-CM | POA: Diagnosis not present

## 2024-07-26 DIAGNOSIS — D464 Refractory anemia, unspecified: Secondary | ICD-10-CM | POA: Diagnosis not present

## 2024-07-26 DIAGNOSIS — R2681 Unsteadiness on feet: Secondary | ICD-10-CM | POA: Diagnosis not present

## 2024-07-26 DIAGNOSIS — K5732 Diverticulitis of large intestine without perforation or abscess without bleeding: Secondary | ICD-10-CM | POA: Diagnosis not present

## 2024-07-26 DIAGNOSIS — R2689 Other abnormalities of gait and mobility: Secondary | ICD-10-CM | POA: Diagnosis not present

## 2024-07-31 ENCOUNTER — Inpatient Hospital Stay (HOSPITAL_COMMUNITY)
Admission: EM | Admit: 2024-07-31 | Discharge: 2024-08-05 | DRG: 683 | Disposition: A | Discharge status: Skilled Nursing Facility | Source: Skilled Nursing Facility | Attending: Internal Medicine | Admitting: Internal Medicine

## 2024-07-31 ENCOUNTER — Other Ambulatory Visit: Payer: Self-pay

## 2024-07-31 DIAGNOSIS — E876 Hypokalemia: Secondary | ICD-10-CM | POA: Diagnosis present

## 2024-07-31 DIAGNOSIS — Z79890 Hormone replacement therapy (postmenopausal): Secondary | ICD-10-CM

## 2024-07-31 DIAGNOSIS — I10 Essential (primary) hypertension: Secondary | ICD-10-CM | POA: Diagnosis present

## 2024-07-31 DIAGNOSIS — E039 Hypothyroidism, unspecified: Secondary | ICD-10-CM | POA: Diagnosis present

## 2024-07-31 DIAGNOSIS — D649 Anemia, unspecified: Secondary | ICD-10-CM | POA: Diagnosis present

## 2024-07-31 DIAGNOSIS — Z8249 Family history of ischemic heart disease and other diseases of the circulatory system: Secondary | ICD-10-CM

## 2024-07-31 DIAGNOSIS — N1831 Chronic kidney disease, stage 3a: Secondary | ICD-10-CM | POA: Diagnosis present

## 2024-07-31 DIAGNOSIS — Z885 Allergy status to narcotic agent status: Secondary | ICD-10-CM

## 2024-07-31 DIAGNOSIS — Z79899 Other long term (current) drug therapy: Secondary | ICD-10-CM

## 2024-07-31 DIAGNOSIS — R627 Adult failure to thrive: Secondary | ICD-10-CM | POA: Diagnosis present

## 2024-07-31 DIAGNOSIS — E8721 Acute metabolic acidosis: Secondary | ICD-10-CM | POA: Diagnosis present

## 2024-07-31 DIAGNOSIS — D631 Anemia in chronic kidney disease: Secondary | ICD-10-CM | POA: Diagnosis present

## 2024-07-31 DIAGNOSIS — Z66 Do not resuscitate: Secondary | ICD-10-CM | POA: Diagnosis present

## 2024-07-31 DIAGNOSIS — Z681 Body mass index (BMI) 19 or less, adult: Secondary | ICD-10-CM

## 2024-07-31 DIAGNOSIS — F32A Depression, unspecified: Secondary | ICD-10-CM | POA: Diagnosis present

## 2024-07-31 DIAGNOSIS — L89152 Pressure ulcer of sacral region, stage 2: Secondary | ICD-10-CM | POA: Diagnosis present

## 2024-07-31 DIAGNOSIS — G3183 Dementia with Lewy bodies: Secondary | ICD-10-CM | POA: Diagnosis present

## 2024-07-31 DIAGNOSIS — Z8679 Personal history of other diseases of the circulatory system: Secondary | ICD-10-CM

## 2024-07-31 DIAGNOSIS — Z555 Less than a high school diploma: Secondary | ICD-10-CM

## 2024-07-31 DIAGNOSIS — E739 Lactose intolerance, unspecified: Secondary | ICD-10-CM | POA: Diagnosis present

## 2024-07-31 DIAGNOSIS — I129 Hypertensive chronic kidney disease with stage 1 through stage 4 chronic kidney disease, or unspecified chronic kidney disease: Secondary | ICD-10-CM | POA: Diagnosis present

## 2024-07-31 DIAGNOSIS — E870 Hyperosmolality and hypernatremia: Secondary | ICD-10-CM | POA: Diagnosis present

## 2024-07-31 DIAGNOSIS — R1310 Dysphagia, unspecified: Secondary | ICD-10-CM | POA: Diagnosis present

## 2024-07-31 DIAGNOSIS — F02818 Dementia in other diseases classified elsewhere, unspecified severity, with other behavioral disturbance: Secondary | ICD-10-CM | POA: Diagnosis present

## 2024-07-31 DIAGNOSIS — Z87891 Personal history of nicotine dependence: Secondary | ICD-10-CM

## 2024-07-31 DIAGNOSIS — E860 Dehydration: Secondary | ICD-10-CM | POA: Diagnosis present

## 2024-07-31 DIAGNOSIS — F0280 Dementia in other diseases classified elsewhere without behavioral disturbance: Secondary | ICD-10-CM | POA: Diagnosis present

## 2024-07-31 DIAGNOSIS — N179 Acute kidney failure, unspecified: Principal | ICD-10-CM | POA: Diagnosis present

## 2024-07-31 DIAGNOSIS — H3530 Unspecified macular degeneration: Secondary | ICD-10-CM | POA: Diagnosis present

## 2024-07-31 DIAGNOSIS — Z90710 Acquired absence of both cervix and uterus: Secondary | ICD-10-CM

## 2024-07-31 DIAGNOSIS — Z515 Encounter for palliative care: Secondary | ICD-10-CM

## 2024-07-31 LAB — CBC WITH DIFFERENTIAL/PLATELET
Abs Immature Granulocytes: 0.03 K/uL (ref 0.00–0.07)
Basophils Absolute: 0.1 K/uL (ref 0.0–0.1)
Basophils Relative: 1 %
Eosinophils Absolute: 0.2 K/uL (ref 0.0–0.5)
Eosinophils Relative: 2 %
HCT: 23.7 % — ABNORMAL LOW (ref 36.0–46.0)
Hemoglobin: 7.6 g/dL — ABNORMAL LOW (ref 12.0–15.0)
Immature Granulocytes: 0 %
Lymphocytes Relative: 18 %
Lymphs Abs: 1.4 K/uL (ref 0.7–4.0)
MCH: 31.7 pg (ref 26.0–34.0)
MCHC: 32.1 g/dL (ref 30.0–36.0)
MCV: 98.8 fL (ref 80.0–100.0)
Monocytes Absolute: 1.1 K/uL — ABNORMAL HIGH (ref 0.1–1.0)
Monocytes Relative: 14 %
Neutro Abs: 5.1 K/uL (ref 1.7–7.7)
Neutrophils Relative %: 65 %
Platelets: 298 K/uL (ref 150–400)
RBC: 2.4 MIL/uL — ABNORMAL LOW (ref 3.87–5.11)
RDW: 15.5 % (ref 11.5–15.5)
WBC: 7.8 K/uL (ref 4.0–10.5)
nRBC: 0 % (ref 0.0–0.2)

## 2024-07-31 LAB — COMPREHENSIVE METABOLIC PANEL WITH GFR
ALT: 11 U/L (ref 0–44)
AST: 13 U/L — ABNORMAL LOW (ref 15–41)
Albumin: 2.8 g/dL — ABNORMAL LOW (ref 3.5–5.0)
Alkaline Phosphatase: 56 U/L (ref 38–126)
Anion gap: 12 (ref 5–15)
BUN: 66 mg/dL — ABNORMAL HIGH (ref 8–23)
CO2: 18 mmol/L — ABNORMAL LOW (ref 22–32)
Calcium: 8 mg/dL — ABNORMAL LOW (ref 8.9–10.3)
Chloride: 111 mmol/L (ref 98–111)
Creatinine, Ser: 2.87 mg/dL — ABNORMAL HIGH (ref 0.44–1.00)
GFR, Estimated: 15 mL/min — ABNORMAL LOW
Glucose, Bld: 91 mg/dL (ref 70–99)
Potassium: 2.9 mmol/L — ABNORMAL LOW (ref 3.5–5.1)
Sodium: 140 mmol/L (ref 135–145)
Total Bilirubin: 0.3 mg/dL (ref 0.0–1.2)
Total Protein: 5.1 g/dL — ABNORMAL LOW (ref 6.5–8.1)

## 2024-07-31 LAB — TROPONIN T, HIGH SENSITIVITY: Troponin T High Sensitivity: 23 ng/L — ABNORMAL HIGH (ref 0–19)

## 2024-07-31 MED ORDER — LORAZEPAM 2 MG/ML IJ SOLN
1.0000 mg | Freq: Once | INTRAMUSCULAR | Status: AC
  Administered 2024-07-31: 1 mg via INTRAVENOUS
  Filled 2024-07-31: qty 1

## 2024-07-31 MED ORDER — SODIUM CHLORIDE 0.9 % IV BOLUS
1000.0000 mL | Freq: Once | INTRAVENOUS | Status: AC
  Administered 2024-07-31: 1000 mL via INTRAVENOUS

## 2024-07-31 NOTE — ED Triage Notes (Signed)
 Pt brought by EMS from [facility] and rehab for abnormal as per EMS BUN is Elevated.

## 2024-08-01 ENCOUNTER — Encounter (HOSPITAL_COMMUNITY): Payer: Self-pay | Admitting: Internal Medicine

## 2024-08-01 DIAGNOSIS — F02818 Dementia in other diseases classified elsewhere, unspecified severity, with other behavioral disturbance: Secondary | ICD-10-CM | POA: Diagnosis present

## 2024-08-01 DIAGNOSIS — N179 Acute kidney failure, unspecified: Secondary | ICD-10-CM | POA: Diagnosis present

## 2024-08-01 DIAGNOSIS — Z515 Encounter for palliative care: Secondary | ICD-10-CM | POA: Diagnosis not present

## 2024-08-01 DIAGNOSIS — Z8679 Personal history of other diseases of the circulatory system: Secondary | ICD-10-CM

## 2024-08-01 DIAGNOSIS — I129 Hypertensive chronic kidney disease with stage 1 through stage 4 chronic kidney disease, or unspecified chronic kidney disease: Secondary | ICD-10-CM | POA: Diagnosis present

## 2024-08-01 DIAGNOSIS — E038 Other specified hypothyroidism: Secondary | ICD-10-CM

## 2024-08-01 DIAGNOSIS — F0280 Dementia in other diseases classified elsewhere without behavioral disturbance: Secondary | ICD-10-CM | POA: Diagnosis not present

## 2024-08-01 DIAGNOSIS — I10 Essential (primary) hypertension: Secondary | ICD-10-CM

## 2024-08-01 DIAGNOSIS — L89152 Pressure ulcer of sacral region, stage 2: Secondary | ICD-10-CM | POA: Diagnosis present

## 2024-08-01 DIAGNOSIS — E039 Hypothyroidism, unspecified: Secondary | ICD-10-CM | POA: Diagnosis present

## 2024-08-01 DIAGNOSIS — Z79890 Hormone replacement therapy (postmenopausal): Secondary | ICD-10-CM | POA: Diagnosis not present

## 2024-08-01 DIAGNOSIS — G3183 Dementia with Lewy bodies: Secondary | ICD-10-CM

## 2024-08-01 DIAGNOSIS — D631 Anemia in chronic kidney disease: Secondary | ICD-10-CM | POA: Diagnosis present

## 2024-08-01 DIAGNOSIS — E8721 Acute metabolic acidosis: Secondary | ICD-10-CM | POA: Diagnosis present

## 2024-08-01 DIAGNOSIS — D649 Anemia, unspecified: Secondary | ICD-10-CM | POA: Diagnosis not present

## 2024-08-01 DIAGNOSIS — E876 Hypokalemia: Secondary | ICD-10-CM

## 2024-08-01 DIAGNOSIS — N189 Chronic kidney disease, unspecified: Secondary | ICD-10-CM

## 2024-08-01 DIAGNOSIS — Z681 Body mass index (BMI) 19 or less, adult: Secondary | ICD-10-CM | POA: Diagnosis not present

## 2024-08-01 DIAGNOSIS — N1831 Chronic kidney disease, stage 3a: Secondary | ICD-10-CM | POA: Diagnosis present

## 2024-08-01 DIAGNOSIS — E860 Dehydration: Secondary | ICD-10-CM | POA: Diagnosis present

## 2024-08-01 DIAGNOSIS — R627 Adult failure to thrive: Secondary | ICD-10-CM | POA: Diagnosis present

## 2024-08-01 DIAGNOSIS — Z87891 Personal history of nicotine dependence: Secondary | ICD-10-CM | POA: Diagnosis not present

## 2024-08-01 DIAGNOSIS — Z8249 Family history of ischemic heart disease and other diseases of the circulatory system: Secondary | ICD-10-CM | POA: Diagnosis not present

## 2024-08-01 DIAGNOSIS — E739 Lactose intolerance, unspecified: Secondary | ICD-10-CM | POA: Diagnosis present

## 2024-08-01 DIAGNOSIS — R1310 Dysphagia, unspecified: Secondary | ICD-10-CM | POA: Diagnosis present

## 2024-08-01 DIAGNOSIS — Z66 Do not resuscitate: Secondary | ICD-10-CM | POA: Diagnosis present

## 2024-08-01 DIAGNOSIS — E870 Hyperosmolality and hypernatremia: Secondary | ICD-10-CM | POA: Diagnosis present

## 2024-08-01 DIAGNOSIS — F32A Depression, unspecified: Secondary | ICD-10-CM | POA: Diagnosis present

## 2024-08-01 DIAGNOSIS — H3530 Unspecified macular degeneration: Secondary | ICD-10-CM | POA: Diagnosis present

## 2024-08-01 LAB — URINALYSIS, ROUTINE W REFLEX MICROSCOPIC
Bilirubin Urine: NEGATIVE
Glucose, UA: NEGATIVE mg/dL
Hgb urine dipstick: NEGATIVE
Ketones, ur: NEGATIVE mg/dL
Leukocytes,Ua: NEGATIVE
Nitrite: NEGATIVE
Protein, ur: 30 mg/dL — AB
Specific Gravity, Urine: 1.015 (ref 1.005–1.030)
pH: 5 (ref 5.0–8.0)

## 2024-08-01 LAB — CBC
HCT: 27.7 % — ABNORMAL LOW (ref 36.0–46.0)
Hemoglobin: 9.4 g/dL — ABNORMAL LOW (ref 12.0–15.0)
MCH: 32.5 pg (ref 26.0–34.0)
MCHC: 33.9 g/dL (ref 30.0–36.0)
MCV: 95.8 fL (ref 80.0–100.0)
Platelets: 338 K/uL (ref 150–400)
RBC: 2.89 MIL/uL — ABNORMAL LOW (ref 3.87–5.11)
RDW: 15.4 % (ref 11.5–15.5)
WBC: 9.3 K/uL (ref 4.0–10.5)
nRBC: 0.2 % (ref 0.0–0.2)

## 2024-08-01 LAB — MAGNESIUM: Magnesium: 2.1 mg/dL (ref 1.7–2.4)

## 2024-08-01 LAB — BASIC METABOLIC PANEL WITH GFR
Anion gap: 16 — ABNORMAL HIGH (ref 5–15)
BUN: 59 mg/dL — ABNORMAL HIGH (ref 8–23)
CO2: 14 mmol/L — ABNORMAL LOW (ref 22–32)
Calcium: 8.3 mg/dL — ABNORMAL LOW (ref 8.9–10.3)
Chloride: 111 mmol/L (ref 98–111)
Creatinine, Ser: 2.54 mg/dL — ABNORMAL HIGH (ref 0.44–1.00)
GFR, Estimated: 17 mL/min — ABNORMAL LOW
Glucose, Bld: 86 mg/dL (ref 70–99)
Potassium: 3.2 mmol/L — ABNORMAL LOW (ref 3.5–5.1)
Sodium: 141 mmol/L (ref 135–145)

## 2024-08-01 LAB — I-STAT CG4 LACTIC ACID, ED
Lactic Acid, Venous: <0.3 mmol/L — ABNORMAL LOW (ref 0.5–1.9)
Lactic Acid, Venous: <0.3 mmol/L — ABNORMAL LOW (ref 0.5–1.9)
Lactic Acid, Venous: <0.3 mmol/L — ABNORMAL LOW (ref 0.5–1.9)

## 2024-08-01 LAB — CREATININE, URINE, RANDOM: Creatinine, Urine: 114 mg/dL

## 2024-08-01 LAB — SODIUM, URINE, RANDOM: Sodium, Ur: <30 mmol/L

## 2024-08-01 MED ORDER — ACETAMINOPHEN 650 MG RE SUPP: 650.0000 mg | Freq: Four times a day (QID) | RECTAL | Status: DC | PRN

## 2024-08-01 MED ORDER — PANTOPRAZOLE SODIUM 40 MG PO TBEC
40.0000 mg | DELAYED_RELEASE_TABLET | Freq: Every day | ORAL | Status: DC
  Administered 2024-08-02: 40 mg via ORAL
  Filled 2024-08-01 (×3): qty 1

## 2024-08-01 MED ORDER — FERROUS SULFATE 325 (65 FE) MG PO TABS
325.0000 mg | ORAL_TABLET | Freq: Every day | ORAL | Status: DC
  Administered 2024-08-02: 325 mg via ORAL
  Filled 2024-08-01 (×4): qty 1

## 2024-08-01 MED ORDER — ACETAMINOPHEN 325 MG PO TABS: 650.0000 mg | ORAL_TABLET | Freq: Four times a day (QID) | ORAL | Status: DC | PRN

## 2024-08-01 MED ORDER — POTASSIUM CHLORIDE 10 MEQ/100ML IV SOLN
10.0000 meq | INTRAVENOUS | Status: AC
  Administered 2024-08-01: 10 meq via INTRAVENOUS
  Filled 2024-08-01: qty 100

## 2024-08-01 MED ORDER — FOLIC ACID 1 MG PO TABS
1.0000 mg | ORAL_TABLET | Freq: Every day | ORAL | Status: DC
  Administered 2024-08-02: 1 mg via ORAL
  Filled 2024-08-01 (×4): qty 1

## 2024-08-01 MED ORDER — LEVOTHYROXINE SODIUM 25 MCG PO TABS
175.0000 ug | ORAL_TABLET | Freq: Every day | ORAL | Status: DC
  Administered 2024-08-03: 175 ug via ORAL
  Filled 2024-08-01 (×3): qty 1

## 2024-08-01 MED ORDER — SODIUM BICARBONATE 8.4 % IV SOLN
INTRAVENOUS | Status: DC
  Filled 2024-08-01: qty 1000
  Filled 2024-08-01: qty 150
  Filled 2024-08-01: qty 1000

## 2024-08-01 MED ORDER — MEMANTINE HCL 10 MG PO TABS
10.0000 mg | ORAL_TABLET | Freq: Two times a day (BID) | ORAL | Status: DC
  Administered 2024-08-02 – 2024-08-05 (×4): 10 mg via ORAL
  Filled 2024-08-01 (×7): qty 1

## 2024-08-01 MED ORDER — ISOSORBIDE MONONITRATE 10 MG PO TABS
20.0000 mg | ORAL_TABLET | Freq: Two times a day (BID) | ORAL | Status: DC
  Administered 2024-08-02 – 2024-08-04 (×2): 20 mg via ORAL
  Filled 2024-08-01 (×9): qty 2

## 2024-08-01 MED ORDER — SODIUM CHLORIDE 0.9% FLUSH
3.0000 mL | Freq: Two times a day (BID) | INTRAVENOUS | Status: DC
  Administered 2024-08-01 – 2024-08-04 (×7): 3 mL via INTRAVENOUS

## 2024-08-01 MED ORDER — SERTRALINE HCL 100 MG PO TABS
100.0000 mg | ORAL_TABLET | Freq: Two times a day (BID) | ORAL | Status: DC
  Administered 2024-08-02 – 2024-08-04 (×3): 100 mg via ORAL
  Filled 2024-08-01 (×6): qty 1

## 2024-08-01 MED ORDER — SODIUM CHLORIDE 0.9 % IV SOLN: 250.0000 mL | INTRAVENOUS | Status: AC | PRN

## 2024-08-01 MED ORDER — POTASSIUM CHLORIDE CRYS ER 20 MEQ PO TBCR
40.0000 meq | EXTENDED_RELEASE_TABLET | ORAL | Status: AC
  Filled 2024-08-01: qty 2

## 2024-08-01 MED ORDER — SODIUM CHLORIDE 0.9% FLUSH: 3.0000 mL | INTRAVENOUS | Status: DC | PRN

## 2024-08-01 MED ORDER — ONDANSETRON HCL 4 MG PO TABS: 4.0000 mg | ORAL_TABLET | Freq: Four times a day (QID) | ORAL | Status: DC | PRN

## 2024-08-01 MED ORDER — BREXPIPRAZOLE 1 MG PO TABS
1.0000 mg | ORAL_TABLET | Freq: Every day | ORAL | Status: DC
  Administered 2024-08-04: 1 mg via ORAL
  Filled 2024-08-01 (×4): qty 1

## 2024-08-01 MED ORDER — SODIUM CHLORIDE 0.9% FLUSH
3.0000 mL | Freq: Two times a day (BID) | INTRAVENOUS | Status: DC
  Administered 2024-08-01 – 2024-08-04 (×5): 3 mL via INTRAVENOUS

## 2024-08-01 MED ORDER — ONDANSETRON HCL 4 MG/2ML IJ SOLN: 4.0000 mg | Freq: Four times a day (QID) | INTRAMUSCULAR | Status: DC | PRN

## 2024-08-01 MED ORDER — POTASSIUM CHLORIDE 10 MEQ/100ML IV SOLN
10.0000 meq | INTRAVENOUS | Status: AC
  Administered 2024-08-01 (×2): 10 meq via INTRAVENOUS

## 2024-08-01 MED ORDER — LACTATED RINGERS IV SOLN: INTRAVENOUS | Status: DC

## 2024-08-01 MED ORDER — SENNOSIDES-DOCUSATE SODIUM 8.6-50 MG PO TABS
2.0000 | ORAL_TABLET | Freq: Every day | ORAL | Status: DC
  Filled 2024-08-01 (×3): qty 2

## 2024-08-01 MED ORDER — AMLODIPINE BESYLATE 10 MG PO TABS
10.0000 mg | ORAL_TABLET | Freq: Every day | ORAL | Status: DC
  Administered 2024-08-02: 10 mg via ORAL
  Filled 2024-08-01 (×4): qty 1

## 2024-08-01 NOTE — ED Provider Notes (Signed)
 " [facility] E[facility] MEDICAL UNIT Provider Note  CSN: [id number] Arrival date & time: 07/31/24 2217  Chief Complaint(s) Abnormal Lab  History provided by son. HPI & MDM [name] is a 89 y.o. female with a past medical history of dementia who presents to the emergency department at the request of the nursing facility where patient lives.  On schedule labs, they noted evidence of AKI, including elevated BUN.  Of note, patient was recently admitted and discharged from the hospital on December 7 after admission for sepsis from urinary source.  Prior to that she was admitted for diverticulitis.  During that time, patient had AKI which improved, patient had BUN of 16 and creatinine of 1.2 upon discharge.  Abnormal Lab  AKI confirmed here.  Bladder scan with approximately 800 cc in the bladder.  Likely intrinsic or prerenal. CBC without leukocytosis.  Stable hemoglobin. UA without evidence of infection. Patient provided with IV fluids Admitted to medicine for further workup and management   Medical Decision Making Amount and/or Complexity of Data Reviewed Labs: ordered. Decision-making details documented in ED Course.  Risk Prescription drug management. Decision regarding hospitalization.    Final Clinical Impression(s) / ED Diagnoses Final diagnoses:  AKI (acute kidney injury)     Past Medical History Past Medical History:  Diagnosis Date   Anemia    Chronic fatigue    Chronic renal insufficiency, stage III (moderate)    Depression    HYPERTENSION 04/04/2007   HYPOTHYROIDISM 04/04/2007   Lewy body dementia (HCC)    Macular degeneration    Patient Active Problem List   Diagnosis Date Noted   History of subarachnoid hemorrhage 08/01/2024   Sepsis (HCC) 07/14/2024   Protein-calorie malnutrition, severe 07/14/2024   Cystitis 07/13/2024   Unresponsive episode 07/13/2024   Diverticulitis 07/13/2024   Acute kidney injury superimposed on chronic kidney  disease 06/29/2024   Pain due to onychomycosis of toenails of both feet 08/19/2023   Acute encephalopathy 05/21/2023   Dysarthria 05/21/2023   Hypokalemia 05/21/2023   Hypertensive urgency 05/21/2023   Lewy body dementia (HCC) 05/21/2023   Anxiety and depression 05/21/2023   Hypocalcemia 05/21/2023   GERD (gastroesophageal reflux disease) 05/21/2023   Fall 12/10/2022   Weakness 05/04/2022   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   CKD (chronic kidney disease) stage 3, GFR 30-59 ml/min (HCC) 03/31/2019   Chronic anemia 03/07/2015   Transaminitis 11/20/2014   Hyponatremia 10/09/2014   Lightheaded 08/30/2014   Right hip pain 07/25/2014   Chronic neck pain 06/06/2014   Memory loss 06/23/2013   Right shoulder pain 06/23/2013   Eczema 01/16/2013   Leg edema 08/23/2012   Chronic insomnia 08/09/2012   Renal insufficiency 03/31/2011   CYSTOCELE WITHOUT MENTION UTERINE PROLAPSE LAT 08/29/2010   Anemia of chronic disease 05/06/2010   LIVER FUNCTION TESTS, ABNORMAL, HX OF 01/14/2009   Hypothyroidism 04/04/2007   Adjustment disorder with mixed anxiety and depressed mood 04/04/2007   Essential hypertension 04/04/2007   Home Medication(s) Prior to Admission medications  Medication Sig Start Date End Date Taking? Authorizing Provider  amLODipine  (NORVASC ) 10 MG tablet TAKE 1 TABLET BY MOUTH DAILY Patient taking differently: Take 10 mg by mouth daily. 06/23/22  Yes Nafziger, Darleene, NP  ferrous sulfate  325 (65 FE) MG tablet Take 1 tablet (325 mg total) by mouth daily with breakfast. 07/16/24  Yes Regalado, Belkys A, MD  folic acid  (FOLVITE ) 1 MG tablet Take 1 tablet (1 mg total) by mouth daily. 07/17/24  Yes Regalado,  Belkys A, MD  isosorbide  mononitrate (ISMO ) 10 MG tablet Take 20 mg by mouth 2 (two) times daily. Hold for SBP<110   Yes [provider]  levothyroxine  (SYNTHROID ) 175 MCG tablet Take 175 mcg by mouth daily before breakfast.   Yes [provider]  memantine  (NAMENDA ) 10 MG tablet  TAKE 1 TABLET BY MOUTH IN THE  MORNING AND AT BEDTIME Patient taking differently: Take 10 mg by mouth 2 (two) times daily. 12/07/22  Yes Georjean Darice HERO, MD  Multiple Vitamin (MULTIVITAMIN) tablet Take 1 tablet by mouth See admin instructions. Take 1 tablet by mouth once a day with food Patient taking differently: Take 1 tablet by mouth daily. 07/01/24  Yes Rizwan, Saima, MD  Multiple Vitamins-Minerals (PRESERVISION AREDS) TABS Take 1 tablet by mouth daily.   Yes [provider]  ondansetron  (ZOFRAN ) 4 MG tablet Take by mouth 2 (two) times daily. 07/28/24  Yes [provider]  pantoprazole  (PROTONIX ) 40 MG tablet Take 40 mg by mouth at bedtime. 05/16/23  Yes [provider]  REXULTI  1 MG TABS tablet Take 1 mg by mouth at bedtime. 07/25/24  Yes [provider]  sertraline  (ZOLOFT ) 100 MG tablet Take 1 tablet (100 mg total) by mouth in the morning and at bedtime. 12/11/22  Yes Hongalgi, Anand D, MD  TYLENOL  500 MG tablet Take 500 mg by mouth in the morning and at bedtime.   Yes [provider]  Vitamin D , Ergocalciferol , (DRISDOL) 1.25 MG (50000 UNIT) CAPS capsule Take 50,000 Units by mouth every 7 (seven) days. Takes on Fridays   Yes [provider]  Brexpiprazole  (REXULTI ) 0.5 MG TABS TAKE 1 TABLET BY MOUTH AT NIGHT Patient not taking: Reported on 08/01/2024 05/11/23   Georjean Darice HERO, MD  senna-docusate (SENOKOT-S) 8.6-50 MG tablet Take 2 tablets by mouth at bedtime. Patient not taking: Reported on 08/01/2024 07/16/24   Madelyne Serum A, MD                                                                                                                                    Allergies Lactose intolerance (gi) and Oxycodone hcl  Review of Systems Review of Systems As noted in HPI  Physical Exam Vital Signs  I have reviewed the triage vital signs BP (!) 129/47 (BP Location: Left Arm)   Pulse 64   Temp (!) 97.5 F (36.4 C) (Oral)   Resp 18   Wt  46.1 kg   SpO2 99%   BMI 18.00 kg/m   Physical Exam Vitals reviewed.  Constitutional:      General: She is not in acute distress.    Appearance: She is well-developed. She is not diaphoretic.  HENT:     Head: Normocephalic and atraumatic.     Right Ear: External ear normal.     Left Ear: External ear normal.     Nose: Nose normal.  Eyes:  General: No scleral icterus.    Conjunctiva/sclera: Conjunctivae normal.  Neck:     Trachea: Phonation normal.  Cardiovascular:     Rate and Rhythm: Normal rate and regular rhythm.  Pulmonary:     Effort: Pulmonary effort is normal. No respiratory distress.     Breath sounds: No stridor.  Abdominal:     General: There is no distension.  Musculoskeletal:        General: Normal range of motion.     Cervical back: Normal range of motion.  Neurological:     Mental Status: She is alert. She is disoriented.  Psychiatric:        Behavior: Behavior normal.     ED Results and Treatments Labs (all labs ordered are listed, but only abnormal results are displayed) Labs Reviewed  CBC WITH DIFFERENTIAL/PLATELET - Abnormal; Notable for the following components:      Result Value   RBC 2.40 (*)    Hemoglobin 7.6 (*)    HCT 23.7 (*)    Monocytes Absolute 1.1 (*)    All other components within normal limits  COMPREHENSIVE METABOLIC PANEL WITH GFR - Abnormal; Notable for the following components:   Potassium 2.9 (*)    CO2 18 (*)    BUN 66 (*)    Creatinine, Ser 2.87 (*)    Calcium  8.0 (*)    Total Protein 5.1 (*)    Albumin 2.8 (*)    AST 13 (*)    GFR, Estimated 15 (*)    All other components within normal limits  URINALYSIS, ROUTINE W REFLEX MICROSCOPIC - Abnormal; Notable for the following components:   APPearance HAZY (*)    Protein, ur 30 (*)    Bacteria, UA RARE (*)    All other components within normal limits  BASIC METABOLIC PANEL WITH GFR - Abnormal; Notable for the following components:   Potassium 3.2 (*)    CO2 14 (*)     BUN 59 (*)    Creatinine, Ser 2.54 (*)    Calcium  8.3 (*)    GFR, Estimated 17 (*)    Anion gap 16 (*)    All other components within normal limits  CBC - Abnormal; Notable for the following components:   RBC 2.89 (*)    Hemoglobin 9.4 (*)    HCT 27.7 (*)    All other components within normal limits  I-STAT CG4 LACTIC ACID, ED - Abnormal; Notable for the following components:   Lactic Acid, Venous <0.3 (*)    All other components within normal limits  I-STAT CG4 LACTIC ACID, ED - Abnormal; Notable for the following components:   Lactic Acid, Venous <0.3 (*)    All other components within normal limits  I-STAT CG4 LACTIC ACID, ED - Abnormal; Notable for the following components:   Lactic Acid, Venous <0.3 (*)    All other components within normal limits  TROPONIN T, HIGH SENSITIVITY - Abnormal; Notable for the following components:   Troponin T High Sensitivity 23 (*)    All other components within normal limits  MAGNESIUM   CREATININE, URINE, RANDOM  SODIUM, URINE, RANDOM  EKG  EKG Interpretation Date/Time:  Monday July 31 2024 23:05:22 EST Ventricular Rate:  71 PR Interval:  188 QRS Duration:  104 QT Interval:  505 QTC Calculation: 484 R Axis:   16  Text Interpretation: Sinus rhythm Paired ventricular premature complexes Abnormal T, consider ischemia, lateral leads Baseline wander in lead(s) V2 Poor data quality Confirmed by Trine Likes [phone]) on 07/31/2024 11:12:44 PM       Radiology No results found.  Medications Ordered in ED Medications  lactated ringers  infusion ( Intravenous New Bag/Given 08/01/24 0621)  amLODipine  (NORVASC ) tablet 10 mg (has no administration in time range)  isosorbide  mononitrate (ISMO ) tablet 20 mg (has no administration in time range)  brexpiprazole  (REXULTI ) tablet 1 mg (has no administration in time range)   memantine  (NAMENDA ) tablet 10 mg (has no administration in time range)  sertraline  (ZOLOFT ) tablet 100 mg (has no administration in time range)  levothyroxine  (SYNTHROID ) tablet 175 mcg (has no administration in time range)  pantoprazole  (PROTONIX ) EC tablet 40 mg (has no administration in time range)  ferrous sulfate  tablet 325 mg (has no administration in time range)  folic acid  (FOLVITE ) tablet 1 mg (has no administration in time range)  senna-docusate (Senokot-S) tablet 2 tablet (has no administration in time range)  potassium chloride  10 mEq in 100 mL IVPB (10 mEq Intravenous New Bag/Given 08/01/24 0637)  sodium chloride  flush (NS) 0.9 % injection 3 mL (has no administration in time range)  sodium chloride  flush (NS) 0.9 % injection 3 mL (has no administration in time range)  sodium chloride  flush (NS) 0.9 % injection 3 mL (has no administration in time range)  0.9 %  sodium chloride  infusion (has no administration in time range)  acetaminophen  (TYLENOL ) tablet 650 mg (has no administration in time range)    Or  acetaminophen  (TYLENOL ) suppository 650 mg (has no administration in time range)  ondansetron  (ZOFRAN ) tablet 4 mg (has no administration in time range)    Or  ondansetron  (ZOFRAN ) injection 4 mg (has no administration in time range)  potassium chloride  10 mEq in 100 mL IVPB (has no administration in time range)  LORazepam  (ATIVAN ) injection 1 mg (1 mg Intravenous Given 07/31/24 2345)  sodium chloride  0.9 % bolus 1,000 mL (1,000 mLs Intravenous Bolus 07/31/24 2344)   Procedures Procedures  (including critical care time)   This chart was dictated using voice recognition software.  Despite best efforts to proofread,  errors can occur which can change the documentation meaning.   Trine Likes Moder, MD 08/01/24 [id number]  "

## 2024-08-01 NOTE — H&P (Signed)
 " History and Physical    [name] FMW:[id number] DOB: 1928-10-14 DOA: 07/31/2024  PCP: Marston Junette HERO, MD   Patient coming from: Home   Chief Complaint:  Chief Complaint  Patient presents with   Abnormal Lab   ED TRIAGE note:  Pt brought by EMS from [facility] and rehab for abnormal as per EMS BUN is Elevated      HPI:  [name] is a 89 y.o. female with medical history significant of Lewy body dementia, hypothyroidism, hypertension, CKD 3A, anemia, subarachnoid hemorrhage due to fall, chronic fatigue presents emergency department via EMS for evaluate for lab work showed elevated BUN and elevated creatinine.  Per chart review patient was recently admitted 2 weeks ago for acute metabolic encephalopathy in the setting of acute diverticulitis, UTI and acute on chronic anemia required IV antibiotic and monitor blood transfusion.  ED physician spoke with patient's son at the bedside patient is DNR/DNI.  Family requesting for palliative consult to discuss further goal of care.  ED Course:  At presentation to ED patient found borderline hypertensive and bradycardic heart rate in between and 51-57. Lab work, UA bacteria present however normal no leukocyte esterase and nitrate.  CBC no evidence of leukocytosis stable H&H and normal platelet count. CMP showing low potassium 2.9, low bicarb 18, elevated creatinine 2.87 and elevated BUN 66.  During discharge on 12/7 creatinine was 1.22 which is around baseline. Elevated troponin 23 which is at baseline.  Normal lactic acid level.  In the ED patient received Ativan  for anxiety and 1 L of NS bolus.  Hospitalist consulted for further eval for management of AKI on CKD stage IIIa and hypokalemia.  Significant labs in the ED: Lab Orders         CBC with Differential         Comprehensive metabolic panel         Urinalysis, Routine w reflex microscopic -Urine, Clean Catch         Basic metabolic panel         Magnesium           Creatinine, urine, random         Sodium, urine, random         CBC         Basic metabolic panel         I-Stat CG4 Lactic Acid         I-Stat CG4 Lactic Acid, ED       Review of Systems:  Review of Systems  Unable to perform ROS: Dementia    Past Medical History:  Diagnosis Date   Anemia    Chronic fatigue    Chronic renal insufficiency, stage III (moderate)    Depression    HYPERTENSION 04/04/2007   HYPOTHYROIDISM 04/04/2007   Lewy body dementia (HCC)    Macular degeneration     Past Surgical History:  Procedure Laterality Date   ABDOMINAL HYSTERECTOMY     APPENDECTOMY     BACK SURGERY  2012   Cataracts     CHOLECYSTECTOMY     ERCP N/A 11/22/2014   Procedure: ENDOSCOPIC RETROGRADE CHOLANGIOPANCREATOGRAPHY (ERCP);  Surgeon: Oliva Boots, MD;  Location[facility] ENDOSCOPY;  Service: Endoscopy;  Laterality: N/A;   HERNIA REPAIR     OOPHORECTOMY     ORIF TIBIA FRACTURE  2012   dr harden   [facility] SURGERY  2011   cervical     reports that she quit smoking about 54 years  ago. Her smoking use included cigarettes. She started smoking about 61 years ago. She has a 7 pack-year smoking history. She has never used smokeless tobacco. She reports that she does not drink alcohol  and does not use drugs.  Allergies[1]  Family History  Problem Relation Age of Onset   Kidney cancer Mother 58       renal cell CA   Heart disease Father    Lymphoma Son    Breast cancer Daughter     Prior to Admission medications  Medication Sig Start Date End Date Taking? Authorizing Provider  amLODipine  (NORVASC ) 10 MG tablet TAKE 1 TABLET BY MOUTH DAILY Patient taking differently: Take 10 mg by mouth daily. 06/23/22  Yes Nafziger, Darleene, NP  ferrous sulfate  325 (65 FE) MG tablet Take 1 tablet (325 mg total) by mouth daily with breakfast. 07/16/24  Yes Regalado, Belkys A, MD  folic acid  (FOLVITE ) 1 MG tablet Take 1 tablet (1 mg total) by mouth daily. 07/17/24  Yes Regalado, Belkys A, MD  isosorbide   mononitrate (ISMO ) 10 MG tablet Take 20 mg by mouth 2 (two) times daily. Hold for SBP<110   Yes [provider]  levothyroxine  (SYNTHROID ) 175 MCG tablet Take 175 mcg by mouth daily before breakfast.   Yes [provider]  memantine  (NAMENDA ) 10 MG tablet TAKE 1 TABLET BY MOUTH IN THE  MORNING AND AT BEDTIME Patient taking differently: Take 10 mg by mouth 2 (two) times daily. 12/07/22  Yes Georjean Darice HERO, MD  Multiple Vitamin (MULTIVITAMIN) tablet Take 1 tablet by mouth See admin instructions. Take 1 tablet by mouth once a day with food Patient taking differently: Take 1 tablet by mouth daily. 07/01/24  Yes Rizwan, Saima, MD  Multiple Vitamins-Minerals (PRESERVISION AREDS) TABS Take 1 tablet by mouth daily.   Yes [provider]  ondansetron  (ZOFRAN ) 4 MG tablet Take by mouth 2 (two) times daily. 07/28/24  Yes [provider]  pantoprazole  (PROTONIX ) 40 MG tablet Take 40 mg by mouth at bedtime. 05/16/23  Yes [provider]  REXULTI  1 MG TABS tablet Take 1 mg by mouth at bedtime. 07/25/24  Yes [provider]  sertraline  (ZOLOFT ) 100 MG tablet Take 1 tablet (100 mg total) by mouth in the morning and at bedtime. 12/11/22  Yes Hongalgi, Anand D, MD  TYLENOL  500 MG tablet Take 500 mg by mouth in the morning and at bedtime.   Yes [provider]  Vitamin D , Ergocalciferol , (DRISDOL) 1.25 MG (50000 UNIT) CAPS capsule Take 50,000 Units by mouth every 7 (seven) days. Takes on Fridays   Yes [provider]  Brexpiprazole  (REXULTI ) 0.5 MG TABS TAKE 1 TABLET BY MOUTH AT NIGHT Patient not taking: Reported on 08/01/2024 05/11/23   Georjean Darice HERO, MD  senna-docusate (SENOKOT-S) 8.6-50 MG tablet Take 2 tablets by mouth at bedtime. Patient not taking: Reported on 08/01/2024 07/16/24   Madelyne Owen LABOR, MD     Physical Exam: Vitals:   08/01/24 0045 08/01/24 0130 08/01/24 0237 08/01/24 0604  BP: (!) 149/60 (!) 159/98 (!) 156/55 (!) 129/47   Pulse: 60 (!) 57 (!) 59 64  Resp: (!) 9 17 18 18   Temp:   97.9 F (36.6 C) (!) 97.5 F (36.4 C)  TempSrc:   Oral Oral  SpO2: 95% 95% 100% 99%  Weight:    46.1 kg    Physical Exam Constitutional:      General: She is not in acute distress.    Appearance: She is  not ill-appearing.  HENT:     Mouth/Throat:     Mouth: Mucous membranes are dry.  Eyes:     Pupils: Pupils are equal, round, and reactive to light.  Cardiovascular:     Rate and Rhythm: Normal rate and regular rhythm.     Pulses: Normal pulses.     Heart sounds: Normal heart sounds.  Pulmonary:     Effort: Pulmonary effort is normal.     Breath sounds: Normal breath sounds.  Abdominal:     Palpations: Abdomen is soft.  Musculoskeletal:     Right lower leg: No edema.     Left lower leg: No edema.  Skin:    Capillary Refill: Capillary refill takes less than 2 seconds.  Neurological:     Mental Status: She is alert. Mental status is at baseline.  Psychiatric:     Comments: Unable to assess due to underlying dementia      Labs on Admission: I have personally reviewed following labs and imaging studies  CBC: Recent Labs  Lab 07/31/24 2305  WBC 7.8  NEUTROABS 5.1  HGB 7.6*  HCT 23.7*  MCV 98.8  PLT 298   Basic Metabolic Panel: Recent Labs  Lab 07/31/24 2305  NA 140  K 2.9*  CL 111  CO2 18*  GLUCOSE 91  BUN 66*  CREATININE 2.87*  CALCIUM  8.0*   GFR: Estimated Creatinine Clearance: 8.9 mL/min (A) (by C-G formula based on SCr of 2.87 mg/dL (H)). Liver Function Tests: Recent Labs  Lab 07/31/24 2305  AST 13*  ALT 11  ALKPHOS 56  BILITOT 0.3  PROT 5.1*  ALBUMIN 2.8*   No results for input(s): LIPASE, AMYLASE in the last 168 hours. No results for input(s): AMMONIA in the last 168 hours. Coagulation Profile: No results for input(s): INR, PROTIME in the last 168 hours. Cardiac Enzymes: No results for input(s): CKTOTAL, CKMB, CKMBINDEX, TROPONINI, TROPONINIHS in the last  168 hours. BNP (last 3 results) No results for input(s): BNP in the last 8760 hours. HbA1C: No results for input(s): HGBA1C in the last 72 hours. CBG: No results for input(s): GLUCAP in the last 168 hours. Lipid Profile: No results for input(s): CHOL, HDL, LDLCALC, TRIG, CHOLHDL, LDLDIRECT in the last 72 hours. Thyroid  Function Tests: No results for input(s): TSH, T4TOTAL, FREET4, T3FREE, THYROIDAB in the last 72 hours. Anemia Panel: No results for input(s): VITAMINB12, FOLATE, FERRITIN, TIBC, IRON, RETICCTPCT in the last 72 hours. Urine analysis:    Component Value Date/Time   COLORURINE YELLOW 08/01/2024 0242   APPEARANCEUR HAZY (A) 08/01/2024 0242   LABSPEC 1.015 08/01/2024 0242   PHURINE 5.0 08/01/2024 0242   GLUCOSEU NEGATIVE 08/01/2024 0242   GLUCOSEU NEGATIVE 07/08/2021 1146   HGBUR NEGATIVE 08/01/2024 0242   BILIRUBINUR NEGATIVE 08/01/2024 0242   BILIRUBINUR neg 09/22/2022 1648   KETONESUR NEGATIVE 08/01/2024 0242   PROTEINUR 30 (A) 08/01/2024 0242   UROBILINOGEN 0.2 09/22/2022 1648   UROBILINOGEN 0.2 07/08/2021 1146   NITRITE NEGATIVE 08/01/2024 0242   LEUKOCYTESUR NEGATIVE 08/01/2024 0242    Radiological Exams on Admission: I have personally reviewed images No results found.   EKG: My personal interpretation of EKG shows: normal sinus rhythm heart rate 71 and PR ventricular premature complex    Assessment/Plan: Principal Problem:   Acute kidney injury superimposed on chronic kidney disease Active Problems:   Hypokalemia   Lewy body dementia (HCC)   Hypothyroidism   Essential hypertension   Chronic anemia   History of subarachnoid hemorrhage  Assessment and Plan: Acute kidney injury superimposed CKD stage IIIA -Present emergency department from nursing home as lab work showed elevated creatinine and BUN.  At presentation to ED patient is hemodynamically stable.  Due to underlying liver dementia patient has been  confusion at baseline.  Son at the bedside verified CODE STATUS DNR/DNI requesting palliative consult for discuss goal of care -Lab work, UA bacteria present however normal no leukocyte esterase and nitrate.  CBC no evidence of leukocytosis stable H&H and normal platelet count. CMP showing low potassium 2.9, low bicarb 18, elevated creatinine 2.87 and elevated BUN 66.  During discharge on 12/7 creatinine was 1.22 which is around baseline. Elevated troponin 23 which is at baseline.  Normal lactic acid level. -In the ED patient received Ativan  for anxiety and 1 L of NS bolus. -Bladder scan no evidence of urine retention - Prerenal acute kidney injury in setting of poor oral intake and concern for intrinsic component from recent antibiotic use as well. - Continue maintenance fluid LR 100 cc/h.  Checking urine creatinine and sodium. - Avoid nephrotoxic agent, monitor I's/O and renally adjust medications.  Hypokalemia -Low potassium 2.9. Replete with IV KCl 30 mEq.  Checking mag level.  Essential hypertension -Continue amlodipine  and Imdur .  History of Lewy body dementia with behavior disturbance -Continue memantine  and Rexulti .  Continue Zoloft . Continue delirium precaution.  Chronic anemia -Stable H&H 7.6 and 20.6.  Continue to monitor CBC  History of subacute hemorrhage -History of subarachnoid hemorrhage in the setting of fall.  Continue fall precaution.  Hypothyroidism -Continue levothyroxine .  DVT prophylaxis:  SCDs Code Status:  DNR/DNI(Do NOT Intubate) Diet: Heart healthy diet Family Communication:   Family was present at bedside, at the time of interview. Opportunity was given to ask question and all questions were answered satisfactorily.  Disposition Plan: Continue to monitor improvement of renal function. Consults: Palliative care Admission status:   Inpatient, Telemetry bed  Severity of Illness: The appropriate patient status for this patient is INPATIENT. Inpatient  status is judged to be reasonable and necessary in order to provide the required intensity of service to ensure the patient's safety. The patient's presenting symptoms, physical exam findings, and initial radiographic and laboratory data in the context of their chronic comorbidities is felt to place them at high risk for further clinical deterioration. Furthermore, it is not anticipated that the patient will be medically stable for discharge from the hospital within 2 midnights of admission.   * I certify that at the point of admission it is my clinical judgment that the patient will require inpatient hospital care spanning beyond 2 midnights from the point of admission due to high intensity of service, high risk for further deterioration and high frequency of surveillance required.DEWAINE    Shamiracle Gorden, MD [facility]  How to contact the [facility] Attending or Consulting provider 7A - 7P or covering provider during after hours 7P -7A, for this patient.  Check the care team in [facility] and look for a) attending/consulting [facility] provider listed and b) the [facility] team listed Log into [email or web address] and use [place]'s universal password to access. If you do not have the password, please contact the hospital operator. Locate the [facility] provider you are looking for under [facility] and page to a number that you can be directly reached. If you still have difficulty reaching the provider, please page the [facility] (Director on Call) for the Hospitalists listed on amion for assistance.  08/01/2024, 6:36 AM           [  1]  Allergies Allergen Reactions   Lactose Intolerance (Gi) Other (See Comments)    Reaction not stated on MAR   Oxycodone Hcl Itching   "

## 2024-08-01 NOTE — Progress Notes (Signed)
 Patient admitted earlier this morning for AKI on CKD stage IIIa.  Patient seen and examined at bedside; wakes up slightly, confused..  I have reviewed patient's medical records including this morning's H&P, current vitals, labs, medications myself.  Palliative care consultation has been requested as per family request.  Continue IV fluids.  Repeat a.m. labs.  Delirium precautions.

## 2024-08-01 NOTE — Progress Notes (Signed)
 This writer attempted to give patient a sip of water prior to medication administration. Patient coughed on water. Medications held for aspiration precaution until speech therapy can evaluate.

## 2024-08-02 DIAGNOSIS — N189 Chronic kidney disease, unspecified: Secondary | ICD-10-CM | POA: Diagnosis not present

## 2024-08-02 DIAGNOSIS — N179 Acute kidney failure, unspecified: Secondary | ICD-10-CM | POA: Diagnosis not present

## 2024-08-02 LAB — COMPREHENSIVE METABOLIC PANEL WITH GFR
ALT: 15 U/L (ref 0–44)
AST: 19 U/L (ref 15–41)
Albumin: 2.7 g/dL — ABNORMAL LOW (ref 3.5–5.0)
Alkaline Phosphatase: 59 U/L (ref 38–126)
Anion gap: 10 (ref 5–15)
BUN: 40 mg/dL — ABNORMAL HIGH (ref 8–23)
CO2: 23 mmol/L (ref 22–32)
Calcium: 8.1 mg/dL — ABNORMAL LOW (ref 8.9–10.3)
Chloride: 111 mmol/L (ref 98–111)
Creatinine, Ser: 1.48 mg/dL — ABNORMAL HIGH (ref 0.44–1.00)
GFR, Estimated: 33 mL/min — ABNORMAL LOW
Glucose, Bld: 93 mg/dL (ref 70–99)
Potassium: 3 mmol/L — ABNORMAL LOW (ref 3.5–5.1)
Sodium: 144 mmol/L (ref 135–145)
Total Bilirubin: 0.4 mg/dL (ref 0.0–1.2)
Total Protein: 5.2 g/dL — ABNORMAL LOW (ref 6.5–8.1)

## 2024-08-02 LAB — CBC WITH DIFFERENTIAL/PLATELET
Abs Immature Granulocytes: 0.06 K/uL (ref 0.00–0.07)
Basophils Absolute: 0 K/uL (ref 0.0–0.1)
Basophils Relative: 0 %
Eosinophils Absolute: 0.1 K/uL (ref 0.0–0.5)
Eosinophils Relative: 1 %
HCT: 27.3 % — ABNORMAL LOW (ref 36.0–46.0)
Hemoglobin: 9 g/dL — ABNORMAL LOW (ref 12.0–15.0)
Immature Granulocytes: 1 %
Lymphocytes Relative: 9 %
Lymphs Abs: 0.9 K/uL (ref 0.7–4.0)
MCH: 31.8 pg (ref 26.0–34.0)
MCHC: 33 g/dL (ref 30.0–36.0)
MCV: 96.5 fL (ref 80.0–100.0)
Monocytes Absolute: 0.7 K/uL (ref 0.1–1.0)
Monocytes Relative: 7 %
Neutro Abs: 8.5 K/uL — ABNORMAL HIGH (ref 1.7–7.7)
Neutrophils Relative %: 82 %
Platelets: 330 K/uL (ref 150–400)
RBC: 2.83 MIL/uL — ABNORMAL LOW (ref 3.87–5.11)
RDW: 15.3 % (ref 11.5–15.5)
WBC: 10.3 K/uL (ref 4.0–10.5)
nRBC: 0 % (ref 0.0–0.2)

## 2024-08-02 LAB — MAGNESIUM: Magnesium: 1.8 mg/dL (ref 1.7–2.4)

## 2024-08-02 MED ORDER — POTASSIUM CHLORIDE 10 MEQ/100ML IV SOLN
10.0000 meq | INTRAVENOUS | Status: AC
  Administered 2024-08-02 (×2): 10 meq via INTRAVENOUS
  Filled 2024-08-02: qty 100

## 2024-08-02 MED ORDER — POTASSIUM CHLORIDE IN NACL 40-0.9 MEQ/L-% IV SOLN
INTRAVENOUS | Status: DC
  Filled 2024-08-02 (×5): qty 1000

## 2024-08-02 NOTE — TOC Initial Note (Signed)
 Transition of Care [facility]) - Initial/Assessment Note    Patient Details  Name: [name] MRN: [id number] Date of Birth: 1928/11/06  [facility]) CM/SW Contact:    [name] DELENA Saltness, LCSW Phone Number: 08/02/2024, 11:34 AM  Clinical Narrative:                 Pt admitted from [facility] for abnormal scheduled labs, specifically evidence of AKI and elevated BUN. CSW spoke with pt's son/POA, [name] [phone], via phone call to discuss discharge planning. Pt resides in LTC at [place], CSW confirmed LTC status with [place] at [place]. Plan is for pt to return to [facility] once medically stable. PT evaluation pending. Palliative care evaluation pending. TOC will continue to follow.   Expected Discharge Plan: Assisted Living Barriers to Discharge: Continued Medical Work up   Patient Goals and CMS Choice Patient states their goals for this hospitalization and ongoing recovery are:: To return to [facility].gov Compare Post Acute Care list provided to:: Patient Represenative (must comment) Choice offered to / list presented to : Adult Children [place] ownership interest in [facility].provided to:: Adult Children    Expected Discharge Plan and Services In-house Referral: Clinical Social Work Discharge Planning Services: NA Post Acute Care Choice: Resumption of Svcs/PTA Provider Living arrangements for the past 2 months: Assisted Living Facility                 DME Arranged: N/A DME Agency: NA       HH Arranged: NA HH Agency: NA        Prior Living Arrangements/Services Living arrangements for the past 2 months: Assisted Living Facility Lives with:: Self, Facility Resident Patient language and need for interpreter reviewed:: Yes Do you feel safe going back to the place where you live?: Yes      Need for Family Participation in Patient Care: Yes (Comment) Care giver support system in place?: Yes (comment) Current home  services: DME Criminal Activity/Legal Involvement Pertinent to Current Situation/Hospitalization: No - Comment as needed  Activities of Daily Living   ADL Screening (condition at time of admission) Independently performs ADLs?: No Does the patient have a NEW difficulty with bathing/dressing/toileting/self-feeding that is expected to last >3 days?: No Does the patient have a NEW difficulty with getting in/out of bed, walking, or climbing stairs that is expected to last >3 days?: No Does the patient have a NEW difficulty with communication that is expected to last >3 days?: No Is the patient deaf or have difficulty hearing?: Yes Does the patient have difficulty seeing, even when wearing glasses/contacts?: No Does the patient have difficulty concentrating, remembering, or making decisions?: Yes  Permission Sought/Granted Permission sought to share information with : Case Manager, Guardian Permission granted to share information with : Yes, Verbal Permission Granted  Share Information with NAME: [name]  Permission granted to share info w AGENCY: [facility]  Permission granted to share info w Relationship: Son/POA  Permission granted to share info w Contact Information: [phone]  Emotional Assessment Appearance:: Appears stated age Attitude/Demeanor/Rapport: Unable to Assess Affect (typically observed): Unable to Assess   Alcohol  / Substance Use: Not Applicable Psych Involvement: No (comment)  Admission diagnosis:  AKI (acute kidney injury) [N17.9] Acute kidney injury superimposed on chronic kidney disease [N17.9, N18.9] Patient Active Problem List   Diagnosis Date Noted   History of subarachnoid hemorrhage 08/01/2024   Sepsis (HCC) 07/14/2024   Protein-calorie malnutrition, severe 07/14/2024  Cystitis 07/13/2024   Unresponsive episode 07/13/2024   Diverticulitis 07/13/2024   Acute kidney injury superimposed on chronic kidney disease 06/29/2024   Pain due to  onychomycosis of toenails of both feet 08/19/2023   Acute encephalopathy 05/21/2023   Dysarthria 05/21/2023   Hypokalemia 05/21/2023   Hypertensive urgency 05/21/2023   Lewy body dementia (HCC) 05/21/2023   Anxiety and depression 05/21/2023   Hypocalcemia 05/21/2023   GERD (gastroesophageal reflux disease) 05/21/2023   Fall 12/10/2022   Weakness 05/04/2022   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   CKD (chronic kidney disease) stage 3, GFR 30-59 ml/min (HCC) 03/31/2019   Chronic anemia 03/07/2015   Transaminitis 11/20/2014   Hyponatremia 10/09/2014   Lightheaded 08/30/2014   Right hip pain 07/25/2014   Chronic neck pain 06/06/2014   Memory loss 06/23/2013   Right shoulder pain 06/23/2013   Eczema 01/16/2013   Leg edema 08/23/2012   Chronic insomnia 08/09/2012   Renal insufficiency 03/31/2011   CYSTOCELE WITHOUT MENTION UTERINE PROLAPSE LAT 08/29/2010   Anemia of chronic disease 05/06/2010   LIVER FUNCTION TESTS, ABNORMAL, HX OF 01/14/2009   Hypothyroidism 04/04/2007   Adjustment disorder with mixed anxiety and depressed mood 04/04/2007   Essential hypertension 04/04/2007   PCP:  Marston Junette HERO, MD Pharmacy:   [facility] - [place], [place] - [address] [place] [address] [place] [address] Phone: [phone] Fax: [phone]     Social Drivers of Health (SDOH) Social History: SDOH Screenings   Food Insecurity: No Food Insecurity (08/01/2024)  Housing: Low Risk (08/01/2024)  Transportation Needs: No Transportation Needs (08/01/2024)  Utilities: Not At Risk (08/01/2024)  Depression (PHQ2-9): Low Risk (06/05/2022)  Financial Resource Strain: Low Risk (06/05/2022)  Physical Activity: Inactive (06/05/2022)  Social Connections: Patient Unable To Answer (08/01/2024)  Stress: Stress Concern Present (06/05/2022)  Tobacco Use: Medium Risk (08/01/2024)   SDOH Interventions: None     Readmission Risk Interventions    08/02/2024   11:24 AM 06/29/2024   11:26 AM   Readmission Risk Prevention Plan  Transportation Screening Complete Complete  PCP or Specialist Appt within 5-7 Days  Complete  Home Care Screening  Complete  Medication Review (RN CM)  Complete  Medication Review (RN Care Manager) Complete   PCP or Specialist appointment within 3-5 days of discharge Complete   HRI or Home Care Consult Complete   SW Recovery Care/Counseling Consult Complete   Palliative Care Screening Complete   Skilled Nursing Facility Not Applicable     Signed: Heather [name], MSW, LCSW Clinical Social Worker Inpatient Care Management 08/02/2024 11:45 AM

## 2024-08-02 NOTE — Evaluation (Addendum)
 Clinical/Bedside Swallow Evaluation Patient Details  Name: [name] MRN: [id number] Date of Birth: [birth year]  Today's Date: 08/02/2024 Time: SLP Start Time (ACUTE ONLY): 0955 SLP Stop Time (ACUTE ONLY): 1012 SLP Time Calculation (min) (ACUTE ONLY): 17 min  Past Medical History:  Past Medical History:  Diagnosis Date   Anemia    Chronic fatigue    Chronic renal insufficiency, stage III (moderate)    Depression    HYPERTENSION 04/04/2007   HYPOTHYROIDISM 04/04/2007   Lewy body dementia (HCC)    Macular degeneration    Past Surgical History:  Past Surgical History:  Procedure Laterality Date   ABDOMINAL HYSTERECTOMY     APPENDECTOMY     BACK SURGERY  2012   Cataracts     CHOLECYSTECTOMY     ERCP N/A 11/22/2014   Procedure: ENDOSCOPIC RETROGRADE CHOLANGIOPANCREATOGRAPHY (ERCP);  Surgeon: Oliva Boots, MD;  Location: [facility] ENDOSCOPY;  Service: Endoscopy;  Laterality: N/A;   HERNIA REPAIR     OOPHORECTOMY     ORIF TIBIA FRACTURE  2012   dr harden CURLIN SURGERY  2011   cervical   HPI:  [name] is a 89 y.o. female who presented to [facility] hospital on 08/01/24 from SNF to evaluate for abnormal lab work showing elevated BUN and elevated creatinine. She was recently admitted two weeks prior to this admission for acute metabolic encephalopathy in setting of acute diverticulitis, UTI and acute on chronic anemia. While in ED, family requesting palliative consult to discuss further GOC. In ED, she was hypertensive, bradycardic, U A showing bacteria present however was normal, CBC showing no evidence of leukocytosis. She was admitted for management of AKI on CKD stage IIIa and hypokalemia. She was kept NPO and SLP ordered to assess swallow. PMH: Lewy body dementia, hypothyroidism, HTN, CKD IIIa, anemia, SAH due to prior fall, chronic fatigue.    Assessment / Plan / Recommendation  Clinical Impression  SLP recommending initiate PO diet of dys 1 (puree) solids, thin liquids and will follow briefly  for toleration and ability to advance.  Patient presents with clinical s/s of what appears to be a cognitive-based oral dysphagia. Initially she had difficulty drawing liquid through straw but then proceeded to drink consecutive straw sips of thin liquids. Swallow initiation appeared timely and no overt s/s aspiration observed. She accepted puree (applesauce) and no observed delay or difficulty with oral transit, no overt s/s aspiration.   SLP Visit Diagnosis: Dysphagia, oral phase (R13.11)    Aspiration Risk  Mild aspiration risk    Diet Recommendation Dysphagia 1 (Puree);Thin liquid    Liquid Administration via: Cup;Straw Medication Administration: Crushed with puree Supervision: Full supervision/cueing for compensatory strategies;Staff to assist with self feeding Compensations: Slow rate;Small sips/bites Postural Changes: Seated upright at 90 degrees    Other Recommendations Oral Care Recommendations: Oral care BID;Staff/trained caregiver to provide oral care     Swallow Evaluation Recommendations     Assistance Recommended at Discharge    Functional Status Assessment Patient has had a recent decline in their functional status and demonstrates the ability to make significant improvements in function in a reasonable and predictable amount of time.  Frequency and Duration min 1 x/week  1 week       Prognosis Prognosis for improved oropharyngeal function: Fair Barriers to Reach Goals: Cognitive deficits      Swallow Study   General Date of Onset: 08/01/24 HPI: [name] is a 89 y.o. female who presented to [facility] hospital on 08/01/24 from  SNF to evaluate for abnormal lab work showing elevated BUN and elevated creatinine. She was recently admitted two weeks prior to this admission for acute metabolic encephalopathy in setting of acute diverticulitis, UTI and acute on chronic anemia. While in ED, family requesting palliative consult to discuss further GOC. In ED, she was hypertensive,  bradycardic, U A showing bacteria present however was normal, CBC showing no evidence of leukocytosis. She was admitted for management of AKI on CKD stage IIIa and hypokalemia. She was kept NPO and SLP ordered to assess swallow. PMH: Lewy body dementia, hypothyroidism, HTN, CKD IIIa, anemia, SAH due to prior fall, chronic fatigue. Type of Study: Bedside Swallow Evaluation Previous Swallow Assessment: 06/30/24 BSE Diet Prior to this Study: NPO Temperature Spikes Noted: No Respiratory Status: Room air History of Recent Intubation: No Behavior/Cognition: Requires cueing;Lethargic/Drowsy;Doesn't follow directions;Confused Oral Cavity Assessment: Dry Oral Care Completed by SLP: Yes Oral Cavity - Dentition: Adequate natural dentition Self-Feeding Abilities: Total assist Patient Positioning: Upright in bed Baseline Vocal Quality: Low vocal intensity Volitional Cough: Cognitively unable to elicit Volitional Swallow: Unable to elicit    Oral/Motor/Sensory Function Overall Oral Motor/Sensory Function: Other (comment) (no focal weakness but does not follow commands for OME)   Ice Chips     Thin Liquid Thin Liquid: Impaired Presentation: Straw Oral Phase Impairments: Reduced labial seal;Poor awareness of bolus    Nectar Thick     Honey Thick     Puree Puree: Within functional limits Presentation: Spoon   Solid     Solid: Not tested      Norleen IVAR Blase, MA, CCC-SLP Speech Therapy  08/02/2024,11:52 AM

## 2024-08-02 NOTE — Consult Note (Signed)
 "                                                                                   Consultation Note Date: 08/02/2024   Patient Name: [name]  DOB: Aug 23, 1928  MRN: [id number]  Age / Sex: 89 y.o., female  PCP: Marston, Junette HERO, MD Referring Physician: Cheryle Page, MD  Reason for Consultation: Establishing goals of care  HPI/Patient Profile: 89 y.o. female admitted on 07/31/2024   Clinical Assessment and Goals of Care: 89 year old lady who has been a long-term resident at [facility] facility for the last year and a half, life-limiting illness of Lewy body dementia, also with past medical history of hypothyroidism hypertension stage III chronic kidney disease anemia subarachnoid hemorrhage due to fall, chronic fatigue Recent admission for acute metabolic encephalopathy deemed secondary to acute diverticulitis urinary tract infection acute on chronic anemia at that time patient required IV antibiotics and blood transfusions Patient admitted at this time with acute kidney injury on top of stage III chronic kidney disease acute metabolic acidosis failure to thrive dysphagia diminished oral intake and ongoing decline in functional status and cognitive status Palliative consultation for goals of care discussions has been requested Chart reviewed Patient seen earlier Arrived at bedside in afternoon and discussed with son [name] present at bedside SLP has seen and evaluated and has recommended pured dysphagia 1 with thin liquids TOC note reviewed patient is a long-term care resident at [facility] facility Palliative medicine is specialized medical care for people living with serious illness. It focuses on providing relief from the symptoms and stress of a serious illness. The goal is to improve quality of life for both the patient and the family. Goals of care: Broad aims of medical therapy in relation to the patient's values and preferences. Our aim is to provide medical care aimed at enabling  patients to achieve the goals that matter most to them, given the circumstances of their particular medical situation and their constraints.    HCPOA  Son Mikenzie Mccannon [phone]  SUMMARY OF RECOMMENDATIONS   Goals of care discussions undertaken with the next of kin healthcare power of attorney son Mauro who was present at the bedside.  He is well aware of the decline trajectory that the patient has had from a cognitive as well as functional standpoint especially for the past couple of months or so.  I introduced hospice philosophy of care in light of patient's medical history as outlined above.  We discussed about SLP evaluation.  And he becomes tearful but states that he recognizes that the patient might be  at the end of her rope, he states that she has lived quite an independent healthy unremarkable life up until the last year and a half.  I introduced the concept of the addition of hospice services at patient's current long-term facility-[facility] and briefly described how that would be an additional layer of support.  Stressed about maximizing the patient's comfort, avoiding pain and suffering and providing for dignity and comfort-focused care.  Will request [facility] consult for addition of hospice services at [facility] otherwise, son wishes to continue scope of current hospitalization until she  is deemed medically stable to be discharged back to her usual long-term care facility Thank you for the consult  Code Status/Advance Care Planning: DNR   Symptom Management:     Palliative Prophylaxis:  Delirium Protocol  Additional Recommendations (Limitations, Scope, Preferences): No Artificial Feeding  Psycho-social/Spiritual:  Desire for further Chaplaincy support:yes Additional Recommendations: Education on Hospice  Prognosis:  < 6 months  Discharge Planning: Skilled Nursing Facility with Hospice      Primary Diagnoses: Present on Admission:  Acute kidney injury superimposed  on chronic kidney disease  Lewy body dementia (HCC)  Hypothyroidism  Essential hypertension  Chronic anemia  Hypokalemia   I have reviewed the medical record, interviewed the patient and family, and examined the patient. The following aspects are pertinent.  Past Medical History:  Diagnosis Date   Anemia    Chronic fatigue    Chronic renal insufficiency, stage III (moderate)    Depression    HYPERTENSION 04/04/2007   HYPOTHYROIDISM 04/04/2007   Lewy body dementia (HCC)    Macular degeneration    Social History   Socioeconomic History   Marital status: Widowed    Spouse name: Not on file   Number of children: 1   Years of education: 9th grade   Highest education level: 9th grade  Occupational History   Occupation: Retired    Associate Professor: RETIRED  Tobacco Use   Smoking status: Former    Current packs/day: 0.00    Average packs/day: 1 pack/day for 7.0 years (7.0 ttl pk-yrs)    Types: Cigarettes    Start date: 10/14/1962    Quit date: 10/13/1969    Years since quitting: 54.8   Smokeless tobacco: Never  Vaping Use   Vaping status: Never Used  Substance and Sexual Activity   Alcohol  use: No   Drug use: No   Sexual activity: Not Currently  Other Topics Concern   Not on file  Social History Narrative   Widowed   1 son   Right handed    Lives alone in a one story home   Caffeine 1 cup daily   Social Drivers of Health   Tobacco Use: Medium Risk (08/01/2024)   Patient History    Smoking Tobacco Use: Former    Smokeless Tobacco Use: Never    Passive Exposure: Not on file  Financial Resource Strain: Low Risk (06/05/2022)   Overall Financial Resource Strain (CARDIA)    Difficulty of Paying Living Expenses: Not hard at all  Food Insecurity: No Food Insecurity (08/01/2024)   Epic    Worried About Radiation Protection Practitioner of Food in the Last Year: Never true    Ran Out of Food in the Last Year: Never true  Transportation Needs: No Transportation Needs (08/01/2024)   Epic    Lack of  Transportation (Medical): No    Lack of Transportation (Non-Medical): No  Physical Activity: Inactive (06/05/2022)   Exercise Vital Sign    Days of Exercise per Week: 0 days    Minutes of Exercise per Session: 0 min  Stress: Stress Concern Present (06/05/2022)   [employer] of Occupational Health - Occupational Stress Questionnaire    Feeling of Stress : To some extent  Social Connections: Patient Unable To Answer (08/01/2024)   Social Connection and Isolation Panel    Frequency of Communication with Friends and Family: Patient unable to answer    Frequency of Social Gatherings with Friends and Family: Patient unable to answer    Attends Religious Services: Patient unable to answer  Active Member of Clubs or Organizations: Patient unable to answer    Attends Club or Organization Meetings: Patient unable to answer    Marital Status: Patient unable to answer  Depression (PHQ2-9): Low Risk (06/05/2022)   Depression (PHQ2-9)    PHQ-2 Score: 0  Alcohol  Screen: Not on file  Housing: Low Risk (08/01/2024)   Epic    Unable to Pay for Housing in the Last Year: No    Number of Times Moved in the Last Year: 0    Homeless in the Last Year: No  Utilities: Not At Risk (08/01/2024)   Epic    Threatened with loss of utilities: No  Health Literacy: Not on file   Family History  Problem Relation Age of Onset   Kidney cancer Mother 28       renal cell CA   Heart disease Father    Lymphoma Son    Breast cancer Daughter    Scheduled Meds:  amLODipine   10 mg Oral Daily   brexpiprazole   1 mg Oral QHS   ferrous sulfate   325 mg Oral Q breakfast   folic acid   1 mg Oral Daily   isosorbide  mononitrate  20 mg Oral BID   levothyroxine   175 mcg Oral Q0600   memantine   10 mg Oral BID   pantoprazole   40 mg Oral QHS   senna-docusate  2 tablet Oral QHS   sertraline   100 mg Oral BID   sodium chloride  flush  3 mL Intravenous Q12H   sodium chloride  flush  3 mL Intravenous Q12H   Continuous  Infusions:  0.9 % NaCl with KCl 40 mEq / L 100 mL/hr at 08/02/24 1115   potassium chloride  10 mEq (08/02/24 1349)   PRN Meds:.acetaminophen  **OR** acetaminophen , ondansetron  **OR** ondansetron  (ZOFRAN ) IV, sodium chloride  flush Medications Prior to Admission:  Prior to Admission medications  Medication Sig Start Date End Date Taking? Authorizing Provider  amLODipine  (NORVASC ) 10 MG tablet TAKE 1 TABLET BY MOUTH DAILY Patient taking differently: Take 10 mg by mouth daily. 06/23/22  Yes Nafziger, Darleene, NP  ferrous sulfate  325 (65 FE) MG tablet Take 1 tablet (325 mg total) by mouth daily with breakfast. 07/16/24  Yes Regalado, Belkys A, MD  folic acid  (FOLVITE ) 1 MG tablet Take 1 tablet (1 mg total) by mouth daily. 07/17/24  Yes Regalado, Belkys A, MD  isosorbide  mononitrate (ISMO ) 10 MG tablet Take 20 mg by mouth 2 (two) times daily. Hold for SBP<110   Yes [provider]  levothyroxine  (SYNTHROID ) 175 MCG tablet Take 175 mcg by mouth daily before breakfast.   Yes [provider]  memantine  (NAMENDA ) 10 MG tablet TAKE 1 TABLET BY MOUTH IN THE  MORNING AND AT BEDTIME Patient taking differently: Take 10 mg by mouth 2 (two) times daily. 12/07/22  Yes Georjean Darice HERO, MD  Multiple Vitamin (MULTIVITAMIN) tablet Take 1 tablet by mouth See admin instructions. Take 1 tablet by mouth once a day with food Patient taking differently: Take 1 tablet by mouth daily. 07/01/24  Yes Rizwan, Saima, MD  Multiple Vitamins-Minerals (PRESERVISION AREDS) TABS Take 1 tablet by mouth daily.   Yes [provider]  ondansetron  (ZOFRAN ) 4 MG tablet Take by mouth 2 (two) times daily. 07/28/24  Yes [provider]  pantoprazole  (PROTONIX ) 40 MG tablet Take 40 mg by mouth at bedtime. 05/16/23  Yes [provider]  REXULTI  1 MG TABS tablet Take 1 mg by mouth at bedtime. 07/25/24  Yes [provider]  sertraline  (ZOLOFT ) 100 MG tablet Take 1 tablet (100 mg total) by mouth in the  morning and at bedtime. 12/11/22  Yes Hongalgi, Anand D, MD  TYLENOL  500 MG tablet Take 500 mg by mouth in the morning and at bedtime.   Yes [provider]  Vitamin D , Ergocalciferol , (DRISDOL) 1.25 MG (50000 UNIT) CAPS capsule Take 50,000 Units by mouth every 7 (seven) days. Takes on Fridays   Yes [provider]  Brexpiprazole  (REXULTI ) 0.5 MG TABS TAKE 1 TABLET BY MOUTH AT NIGHT Patient not taking: Reported on 08/01/2024 05/11/23   Georjean Darice HERO, MD  senna-docusate (SENOKOT-S) 8.6-50 MG tablet Take 2 tablets by mouth at bedtime. Patient not taking: Reported on 08/01/2024 07/16/24   Madelyne Serum A, MD   Allergies[1] Review of Systems Weakness Physical Exam Frail and chronically ill-appearing lady resting in bed Diminished with sounds bases No peripheral edema Awakens and arouses some but does not verbalize to me  Vital Signs: BP (!) 95/53 (BP Location: Left Arm)   Pulse 68   Temp 98.7 F (37.1 C)   Resp 14   Ht 5' 2.99 (1.6 m)   Wt 44.1 kg   SpO2 96%   BMI 17.23 kg/m  Pain Scale: 0-10   Pain Score: 0-No pain   SpO2: SpO2: 96 % O2 Device:SpO2: 96 % O2 Flow Rate: .   IO: Intake/output summary:  Intake/Output Summary (Last 24 hours) at 08/02/2024 1349 Last data filed at 08/01/2024 2028 Gross per 24 hour  Intake --  Output 350 ml  Net -350 ml    LBM: Last BM Date : 08/01/24 Baseline Weight: Weight: 46.1 kg Most recent weight: Weight: 44.1 kg     Palliative Assessment/Data:   Palliative performance scale 40%  Time In: 1300 Time Out: 1420 Time Total: 80 Greater than 50%  of this time was spent counseling and coordinating care related to the above assessment and plan.  Signed by: Lonia Serve, MD   Please contact Palliative Medicine Team phone at [phone] for questions and concerns.  For individual provider: See Amion                  [1]  Allergies Allergen Reactions   Lactose Intolerance (Gi) Other (See Comments)     Reaction not stated on MAR   Oxycodone Hcl Itching   "

## 2024-08-02 NOTE — Plan of Care (Signed)
  Problem: Coping: Goal: Level of anxiety will decrease Outcome: Progressing   Problem: Elimination: Goal: Will not experience complications related to bowel motility Outcome: Progressing   Problem: Pain Managment: Goal: General experience of comfort will improve and/or be controlled Outcome: Progressing   Problem: Safety: Goal: Ability to remain free from injury will improve Outcome: Progressing

## 2024-08-02 NOTE — Evaluation (Signed)
 Physical Therapy Evaluation Patient Details Name: [name] MRN: [id number] DOB: 06-14-1929 Today's Date: 08/02/2024  History of Present Illness  89 yo female presents to therapy from [facility] following hospital admission on 07/31/2024 due to abn lab findings. Pt found to have AKI on CKD IIIa and hypokalemia. Pt recently hospitalized in November of this year for metabolic encephalopathy, diverticulitis, anemia and UTI. Pt PMH includes but is not limited to: anemia, HTN, hypothyroidism, fall, SDH, CKD, chronic fatigue, renal insufficiency, lewy body dementia, and back surgery.  Clinical Impression    Pt admitted with above diagnosis.  Pt currently with functional limitations due to the deficits listed below (see PT Problem List). Pt  in bed resting when PT arrived. Son present. Pt easily roused, however confused and oriented to self only, limited verbal communication with decreased clarity and volume. Son in/out of personal room during PT eval. Pt required total A for supine <> sit, max A to roll side to side with pt expressing fear of falling and reaching and grasping bed rails, sitting balance EOB with mod/max A and strong R lateral and posterior lean. Pt limited with activity tolerance. Son reports a significant functional decline since first hospitalization. Pt appears to have guarded rehab potential at this time.  Patient will benefit from continued inpatient follow up therapy, <3 hours/day.  PT recommends SNF, however therapist at [facility] are familiar with pt functional baseline and can determine skilled therapy needs upon hospital dc/c.  Pt will benefit from acute skilled PT to increase their independence and safety with mobility to allow discharge.         If plan is discharge home, recommend the following: Two people to help with walking and/or transfers;A lot of help with bathing/dressing/bathroom;Assistance with cooking/housework;Assistance with feeding;Direct supervision/assist for  medications management;Direct supervision/assist for financial management;Assist for transportation;Supervision due to cognitive status   Can travel by private vehicle   No    Equipment Recommendations None recommended by PT  Recommendations for Other Services       Functional Status Assessment Patient has had a recent decline in their functional status and demonstrates the ability to make significant improvements in function in a reasonable and predictable amount of time.     Precautions / Restrictions Precautions Precautions: Fall (visual deficits) Restrictions Weight Bearing Restrictions Per Provider Order: No      Mobility  Bed Mobility Overal bed mobility: Needs Assistance Bed Mobility: Supine to Sit, Sit to Supine, Rolling Rolling: Max assist, Used rails   Supine to sit: Total assist, HOB elevated Sit to supine: Total assist   General bed mobility comments: pt demonstrates minimal to no initation for bed mobility tasks    Transfers                   General transfer comment: NT    Ambulation/Gait               General Gait Details: NT, pt non ambulatory prior to hospitalizations  Stairs            Wheelchair Mobility     Tilt Bed    Modified Rankin (Stroke Patients Only)       Balance Overall balance assessment: History of Falls, Needs assistance Sitting-balance support: Feet supported Sitting balance-Leahy Scale: Poor Sitting balance - Comments: mod to max  A with strong posterior and R lateral lean, unable to maintain attention to midline with faciltiation and mod A  Pertinent Vitals/Pain Pain Assessment Pain Assessment: No/denies pain (no apparent pain response)    Home Living Family/patient expects to be discharged to:: Skilled nursing facility                   Additional Comments: pt is LTC at [facility]    Prior Function Prior Level of Function : Patient poor  historian/Family not available             Mobility Comments: Son present and reports pt requires A for SPT bed <> wc and wc <> commode prior to 3 most recent hospitalizations, pt able to self propel wc in facility with B LE ADLs Comments: pt requires A for all ADLs, self care tasks, medication management and for feeding     Extremity/Trunk Assessment        Lower Extremity Assessment Lower Extremity Assessment: Generalized weakness;Difficult to assess due to impaired cognition    Cervical / Trunk Assessment Cervical / Trunk Assessment: Back Surgery  Communication   Communication Communication: Impaired Factors Affecting Communication: Reduced clarity of speech (decreased volume)    Cognition Arousal: Alert Behavior During Therapy: Flat affect   PT - Cognitive impairments: History of cognitive impairments                       PT - Cognition Comments: pt is oreinted to self only Following commands: Impaired Following commands impaired: Follows one step commands inconsistently     Cueing       General Comments      Exercises     Assessment/Plan    PT Assessment Patient needs continued PT services  PT Problem List Decreased strength;Decreased activity tolerance;Decreased balance;Decreased mobility;Decreased coordination;Decreased cognition;Decreased skin integrity       PT Treatment Interventions Functional mobility training;Therapeutic activities;Therapeutic exercise;Balance training;Neuromuscular re-education;Cognitive remediation;Patient/family education    PT Goals (Current goals can be found in the Care Plan section)  Acute Rehab PT Goals PT Goal Formulation: Patient unable to participate in goal setting    Frequency Min 1X/week     Co-evaluation               AM-PAC PT 6 Clicks Mobility  Outcome Measure Help needed turning from your back to your side while in a flat bed without using bedrails?: A Lot Help needed moving from  lying on your back to sitting on the side of a flat bed without using bedrails?: Total Help needed moving to and from a bed to a chair (including a wheelchair)?: Total Help needed standing up from a chair using your arms (e.g., wheelchair or bedside chair)?: Total Help needed to walk in hospital room?: Total Help needed climbing 3-5 steps with a railing? : Total 6 Click Score: 7    End of Session   Activity Tolerance: Patient limited by fatigue Patient left: in bed;with family/visitor present;with bed alarm set Nurse Communication: Mobility status PT Visit Diagnosis: Unsteadiness on feet (R26.81);Other abnormalities of gait and mobility (R26.89);Muscle weakness (generalized) (M62.81);History of falling (Z91.81);Adult, failure to thrive (R62.7)    Time: 8679-8654 PT Time Calculation (min) (ACUTE ONLY): 25 min   Charges:   PT Evaluation $PT Eval Low Complexity: 1 Low PT Treatments $Therapeutic Activity: 8-22 mins PT General Charges $$ ACUTE PT VISIT: 1 Visit         [place], PT Acute Rehab   [place] VEAR Drone 08/02/2024, 3:59 PM

## 2024-08-02 NOTE — TOC Progression Note (Addendum)
 Transition of Care [facility]) - Progression Note    Patient Details  Name: [name] MRN: [id number] Date of Birth: 08/04/1929  Transition of Care [facility]) CM/SW Contact  [name], LCSW Phone Number: 08/02/2024, 11:06 AM  Clinical Narrative:     08/02/2024  11:16 AM - CSW spoke with pt's Son/POA, Mauro Loud [phone], via phone call to discuss discharge planning. [name] reports pt resides at [facility] in LTC. PT evaluation pending. Palliative care evaluation pending. TOC will continue to follow.  11:08 AM - CSW attempted to speak with pt's Son/POA, Mauro Loud, via phone call to discuss discharge planning. No answer, voicemail left. TOC will continue to follow.  08/01/2024  3:27 PM - CSW attempted to speak with pt's Son/POA, [name] Cryan, via phone call to discuss discharge planning. No answer, voicemail left. TOC will continue to follow.  2:39 PM - CSW attempted to speak with pt's Son/POA, Mauro Loud, via phone call to discuss discharge planning. No answer, voicemail left. TOC will continue to follow.   Expected Discharge Plan and Services  [facility]    Social Drivers of Health (SDOH) Interventions SDOH Screenings   Food Insecurity: No Food Insecurity (08/01/2024)  Housing: Low Risk (08/01/2024)  Transportation Needs: No Transportation Needs (08/01/2024)  Utilities: Not At Risk (08/01/2024)  Depression (PHQ2-9): Low Risk (06/05/2022)  Financial Resource Strain: Low Risk (06/05/2022)  Physical Activity: Inactive (06/05/2022)  Social Connections: Patient Unable To Answer (08/01/2024)  Stress: Stress Concern Present (06/05/2022)  Tobacco Use: Medium Risk (08/01/2024)    Readmission Risk Interventions    06/29/2024   11:26 AM  Readmission Risk Prevention Plan  Transportation Screening Complete  PCP or Specialist Appt within 5-7 Days Complete  Home Care Screening Complete  Medication Review (RN CM) Complete    Signed: Heather Saltness, MSW,  LCSW Clinical Social Worker Inpatient Care Management 08/02/2024 11:10 AM

## 2024-08-02 NOTE — Plan of Care (Signed)

## 2024-08-02 NOTE — Progress Notes (Signed)
 " PROGRESS NOTE    [name]  FMW:[id number] DOB: 07-09-1929 DOA: 07/31/2024 PCP: Marston Junette HERO, MD   Brief Narrative:   89 y.o. female with medical history significant of Lewy body dementia, hypothyroidism, hypertension, CKD 3A, anemia, subarachnoid hemorrhage due to fall, chronic fatigue and recent admission for acute metabolic encephalopathy due to acute diverticulitis/UTI/acute on chronic anemia requiring IV antibiotics and blood transfusion was sent from SNF for elevated BUN and creatinine.  On presentation, creatinine was 2.87 (creatinine on discharge on 07/26/2024 was 1.22).  She was started on IV fluids.  Assessment & Plan:   AKI on CKD stage IIIa Acute metabolic acidosis - On presentation, creatinine was 2.87 (creatinine on discharge on 07/26/2024 was 1.22).  Possibly from poor oral intake and dehydration.  - Creatinine improving to 1.48 today.  Bicarb is 23 today.  Today on bicarb drip.  DC bicarb drip.  Start normal saline at 75 cc an hour.  UA negative for UTI on presentation.  Hypokalemia - Continue supplementation.  Repeat a.m. labs  Anemia of chronic disease - From chronic illnesses.  Hemoglobin stable.  Monitor intermittently  History of Lewy body dementia with behavioral disturbance - Fall/delirium precautions.  Continue memantine  and Rexulti  if able to tolerate orally along with sertraline   Dysphagia -SLP evaluation  History of subarachnoid hemorrhage - In the setting of fall in the past.  Continue fall precaution  Hypothyroidism - Continue levothyroxine  if able to tolerate orally  Hypertension - Continue amlodipine  and isosorbide  mononitrate.  Blood pressure intermittently on the higher side.  Goals of care - Overall prognosis is guarded to poor.  Palliative care consultation is pending.  DVT prophylaxis: SCDs Code Status: DNR Family Communication: None at bedside Disposition Plan: Status is: Inpatient Remains inpatient appropriate because: Of  severity of illness    Consultants: Palliative care  Procedures: None  Antimicrobials: None   Subjective: Patient seen and examined at bedside.  Poor historian.  No agitation, fever or seizures reported.  Objective: Vitals:   08/01/24 2028 08/01/24 2058 08/02/24 0411 08/02/24 0500  BP: (!) 149/112 (!) 149/112 (!) 143/49   Pulse: 71 71 (!) 59   Resp: 16 16 16    Temp: (!) 97.3 F (36.3 C) (!) 97.3 F (36.3 C) 98.1 F (36.7 C)   TempSrc: Oral Oral Oral   SpO2: 97%  100%   Weight:  46.1 kg  44.1 kg  Height:  5' 2.99 (1.6 m)      Intake/Output Summary (Last 24 hours) at 08/02/2024 0838 Last data filed at 08/01/2024 2028 Gross per 24 hour  Intake 5 ml  Output 350 ml  Net -345 ml   Filed Weights   08/01/24 0604 08/01/24 2058 08/02/24 0500  Weight: 46.1 kg 46.1 kg 44.1 kg    Examination:  General exam: Appears calm and comfortable.  Chronically ill and deconditioned looking. Respiratory system: Bilateral decreased breath sounds at bases with scattered crackles Cardiovascular system: S1 & S2 heard, intermittently bradycardic Gastrointestinal system: Abdomen is nondistended, soft and nontender. Normal bowel sounds heard. Extremities: No cyanosis, clubbing, edema  Central nervous system: Wakes up slightly, hardly answers any questions.  Poor historian.  No focal neurological deficits. Moving extremities Skin: No rashes, lesions or ulcers Psychiatry: Not agitated.  Flat affect.    Data Reviewed: I have personally reviewed following labs and imaging studies  CBC: Recent Labs  Lab 07/31/24 2305 08/01/24 0631 08/02/24 0536  WBC 7.8 9.3 10.3  NEUTROABS 5.1  --  8.5*  HGB 7.6* 9.4* 9.0*  HCT 23.7* 27.7* 27.3*  MCV 98.8 95.8 96.5  PLT 298 338 330   Basic Metabolic Panel: Recent Labs  Lab 07/31/24 2305 08/01/24 0631 08/02/24 0536  NA 140 141 144  K 2.9* 3.2* 3.0*  CL 111 111 111  CO2 18* 14* 23  GLUCOSE 91 86 93  BUN 66* 59* 40*  CREATININE 2.87* 2.54*  1.48*  CALCIUM  8.0* 8.3* 8.1*  MG  --  2.1 1.8   GFR: Estimated Creatinine Clearance: 16.5 mL/min (A) (by C-G formula based on SCr of 1.48 mg/dL (H)). Liver Function Tests: Recent Labs  Lab 07/31/24 2305 08/02/24 0536  AST 13* 19  ALT 11 15  ALKPHOS 56 59  BILITOT 0.3 0.4  PROT 5.1* 5.2*  ALBUMIN 2.8* 2.7*   No results for input(s): LIPASE, AMYLASE in the last 168 hours. No results for input(s): AMMONIA in the last 168 hours. Coagulation Profile: No results for input(s): INR, PROTIME in the last 168 hours. Cardiac Enzymes: No results for input(s): CKTOTAL, CKMB, CKMBINDEX, TROPONINI in the last 168 hours. BNP (last 3 results) Recent Labs    06/28/24 1850 07/13/24 1430  PROBNP 454.0* 460.0*   HbA1C: No results for input(s): HGBA1C in the last 72 hours. CBG: No results for input(s): GLUCAP in the last 168 hours. Lipid Profile: No results for input(s): CHOL, HDL, LDLCALC, TRIG, CHOLHDL, LDLDIRECT in the last 72 hours. Thyroid  Function Tests: No results for input(s): TSH, T4TOTAL, FREET4, T3FREE, THYROIDAB in the last 72 hours. Anemia Panel: No results for input(s): VITAMINB12, FOLATE, FERRITIN, TIBC, IRON, RETICCTPCT in the last 72 hours. Sepsis Labs: Recent Labs  Lab 07/31/24 2357 08/01/24 0018 08/01/24 0146  LATICACIDVEN <0.3* <0.3* <0.3*    No results found for this or any previous visit (from the past 240 hours).       Radiology Studies: No results found.      Scheduled Meds:  amLODipine   10 mg Oral Daily   brexpiprazole   1 mg Oral QHS   ferrous sulfate   325 mg Oral Q breakfast   folic acid   1 mg Oral Daily   isosorbide  mononitrate  20 mg Oral BID   levothyroxine   175 mcg Oral Q0600   memantine   10 mg Oral BID   pantoprazole   40 mg Oral QHS   senna-docusate  2 tablet Oral QHS   sertraline   100 mg Oral BID   sodium chloride  flush  3 mL Intravenous Q12H   sodium chloride  flush  3 mL  Intravenous Q12H   Continuous Infusions:  sodium bicarbonate  150 mEq in dextrose  5 % 1,150 mL infusion 100 mL/hr at 08/02/24 0016          Sophie Mao, MD [facility] 08/02/2024, 8:38 AM   "

## 2024-08-03 DIAGNOSIS — N189 Chronic kidney disease, unspecified: Secondary | ICD-10-CM | POA: Diagnosis not present

## 2024-08-03 DIAGNOSIS — N179 Acute kidney failure, unspecified: Secondary | ICD-10-CM | POA: Diagnosis not present

## 2024-08-03 LAB — BASIC METABOLIC PANEL WITH GFR
Anion gap: 11 (ref 5–15)
BUN: 24 mg/dL — ABNORMAL HIGH (ref 8–23)
CO2: 20 mmol/L — ABNORMAL LOW (ref 22–32)
Calcium: 7.9 mg/dL — ABNORMAL LOW (ref 8.9–10.3)
Chloride: 115 mmol/L — ABNORMAL HIGH (ref 98–111)
Creatinine, Ser: 1.29 mg/dL — ABNORMAL HIGH (ref 0.44–1.00)
GFR, Estimated: 39 mL/min — ABNORMAL LOW
Glucose, Bld: 97 mg/dL (ref 70–99)
Potassium: 3.7 mmol/L (ref 3.5–5.1)
Sodium: 147 mmol/L — ABNORMAL HIGH (ref 135–145)

## 2024-08-03 LAB — CBC WITH DIFFERENTIAL/PLATELET
Abs Immature Granulocytes: 0.08 K/uL — ABNORMAL HIGH (ref 0.00–0.07)
Basophils Absolute: 0 K/uL (ref 0.0–0.1)
Basophils Relative: 0 %
Eosinophils Absolute: 0.1 K/uL (ref 0.0–0.5)
Eosinophils Relative: 0 %
HCT: 25 % — ABNORMAL LOW (ref 36.0–46.0)
Hemoglobin: 8 g/dL — ABNORMAL LOW (ref 12.0–15.0)
Immature Granulocytes: 1 %
Lymphocytes Relative: 8 %
Lymphs Abs: 1.2 K/uL (ref 0.7–4.0)
MCH: 31.5 pg (ref 26.0–34.0)
MCHC: 32 g/dL (ref 30.0–36.0)
MCV: 98.4 fL (ref 80.0–100.0)
Monocytes Absolute: 0.7 K/uL (ref 0.1–1.0)
Monocytes Relative: 5 %
Neutro Abs: 12.5 K/uL — ABNORMAL HIGH (ref 1.7–7.7)
Neutrophils Relative %: 86 %
Platelets: 352 K/uL (ref 150–400)
RBC: 2.54 MIL/uL — ABNORMAL LOW (ref 3.87–5.11)
RDW: 15.6 % — ABNORMAL HIGH (ref 11.5–15.5)
WBC: 14.6 K/uL — ABNORMAL HIGH (ref 4.0–10.5)
nRBC: 0 % (ref 0.0–0.2)

## 2024-08-03 LAB — MAGNESIUM: Magnesium: 2 mg/dL (ref 1.7–2.4)

## 2024-08-03 MED ORDER — ENSURE PLUS HIGH PROTEIN PO LIQD: 237.0000 mL | Freq: Two times a day (BID) | ORAL | Status: DC

## 2024-08-03 MED ORDER — DEXTROSE 5 % IV SOLN: INTRAVENOUS | Status: AC

## 2024-08-03 NOTE — Progress Notes (Signed)
 "                                                                                                                                                                                                          Daily Progress Note   Patient Name: [name]       Date: 08/03/2024 DOB: 1928-12-09  Age: 89 y.o. MRN#: [id number] Attending Physician: Cheryle Page, MD Primary Care Physician: Marston Junette HERO, MD Admit Date: 07/31/2024  Reason for Consultation/Follow-up: Establishing goals of care  Subjective:  Awake Minimal PO intake  Length of Stay: 2  Current Medications: Scheduled Meds:   amLODipine   10 mg Oral Daily   brexpiprazole   1 mg Oral QHS   ferrous sulfate   325 mg Oral Q breakfast   folic acid   1 mg Oral Daily   isosorbide  mononitrate  20 mg Oral BID   levothyroxine   175 mcg Oral Q0600   memantine   10 mg Oral BID   pantoprazole   40 mg Oral QHS   senna-docusate  2 tablet Oral QHS   sertraline   100 mg Oral BID   sodium chloride  flush  3 mL Intravenous Q12H   sodium chloride  flush  3 mL Intravenous Q12H    Continuous Infusions:  dextrose  75 mL/hr at 08/03/24 0834    PRN Meds: acetaminophen  **OR** acetaminophen , ondansetron  **OR** ondansetron  (ZOFRAN ) IV, sodium chloride  flush  Physical Exam         Awake this am No distress Resting in bed Appears frail and with generalized weakness  Vital Signs: BP (!) 132/44 (BP Location: Right Arm)   Pulse 65   Temp 98.4 F (36.9 C)   Resp 18   Ht 5' 2.99 (1.6 m)   Wt 47.5 kg   SpO2 98%   BMI 18.55 kg/m  SpO2: SpO2: 98 % O2 Device: O2 Device: Room Air O2 Flow Rate:    Intake/output summary:  Intake/Output Summary (Last 24 hours) at 08/03/2024 1416 Last data filed at 08/03/2024 0933 Gross per 24 hour  Intake 1591.43 ml  Output --  Net 1591.43 ml   LBM: Last BM Date : 08/03/24 Baseline Weight: Weight: 46.1 kg Most recent weight: Weight: 47.5 kg       Palliative Assessment/Data:      Patient Active  Problem List   Diagnosis Date Noted   History of subarachnoid hemorrhage 08/01/2024   Sepsis (HCC) 07/14/2024   Protein-calorie malnutrition, severe 07/14/2024   Cystitis 07/13/2024   Unresponsive episode 07/13/2024   Diverticulitis 07/13/2024   Acute kidney injury superimposed on  chronic kidney disease 06/29/2024   Pain due to onychomycosis of toenails of both feet 08/19/2023   Acute encephalopathy 05/21/2023   Dysarthria 05/21/2023   Hypokalemia 05/21/2023   Hypertensive urgency 05/21/2023   Lewy body dementia (HCC) 05/21/2023   Anxiety and depression 05/21/2023   Hypocalcemia 05/21/2023   GERD (gastroesophageal reflux disease) 05/21/2023   Fall 12/10/2022   Weakness 05/04/2022   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   CKD (chronic kidney disease) stage 3, GFR 30-59 ml/min (HCC) 03/31/2019   Chronic anemia 03/07/2015   Transaminitis 11/20/2014   Hyponatremia 10/09/2014   Lightheaded 08/30/2014   Right hip pain 07/25/2014   Chronic neck pain 06/06/2014   Memory loss 06/23/2013   Right shoulder pain 06/23/2013   Eczema 01/16/2013   Leg edema 08/23/2012   Chronic insomnia 08/09/2012   Renal insufficiency 03/31/2011   CYSTOCELE WITHOUT MENTION UTERINE PROLAPSE LAT 08/29/2010   Anemia of chronic disease 05/06/2010   LIVER FUNCTION TESTS, ABNORMAL, HX OF 01/14/2009   Hypothyroidism 04/04/2007   Adjustment disorder with mixed anxiety and depressed mood 04/04/2007   Essential hypertension 04/04/2007    Palliative Care Assessment & Plan   Patient Profile:    Assessment: 89 year old lady who has been a long-term resident at [facility] facility for the last year and a half, life-limiting illness of Lewy body dementia, also with past medical history of hypothyroidism hypertension stage III chronic kidney disease anemia subarachnoid hemorrhage due to fall, chronic fatigue Recent admission for acute metabolic encephalopathy deemed secondary to acute diverticulitis urinary tract infection acute on  chronic anemia at that time patient required IV antibiotics and blood transfusions Patient admitted at this time with acute kidney injury on top of stage III chronic kidney disease acute metabolic acidosis failure to thrive dysphagia diminished oral intake and ongoing decline in functional status and cognitive status Palliative consultation for goals of care discussions has been requested  Recommendations/Plan: DNR - comfort care Back to [facility] with hospice.    Goals of Care and Additional Recommendations: Limitations on Scope of Treatment: Full Comfort Care  Code Status:    Code Status Orders  (From admission, onward)           Start     Ordered   08/03/24 1417  Do not attempt resuscitation (DNR) - Comfort care  (Code Status)  Continuous       Question Answer Comment  If patient has no pulse and is not breathing Do Not Attempt Resuscitation   In Pre-Arrest Conditions (Patient Is Breathing and Has a Pulse) Provide comfort measures. Relieve any mechanical airway obstruction. Avoid transfer unless required for comfort.   Consent: Discussion documented in EHR or advanced directives reviewed      08/03/24 1416           Code Status History     Date Active Date Inactive Code Status Order ID Comments User Context   08/01/2024 0402 08/03/2024 1416 Limited: Do not attempt resuscitation (DNR) -DNR-LIMITED -Do Not Intubate/DNI  [id number]  Sundil, Subrina, MD ED   08/01/2024 0355 08/01/2024 0402 Limited: Do not attempt resuscitation (DNR) -DNR-LIMITED -Do Not Intubate/DNI  [id number]  Sundil, Subrina, MD ED   08/01/2024 0355 08/01/2024 0355 Do not attempt resuscitation (DNR) PRE-ARREST INTERVENTIONS DESIRED [id number]  Lee Kingfisher, MD ED   07/13/2024 2337 07/16/2024 2053 Do not attempt resuscitation (DNR) PRE-ARREST INTERVENTIONS DESIRED [id number]  Lorren Bradly POUR, MD ED   06/28/2024 2324 07/01/2024 1719 Limited: Do not attempt resuscitation (DNR) -DNR-LIMITED -Do Not  Intubate/DNI  [id number]  Patt Alm Macho, MD ED   05/21/2023 1905 05/25/2023 2047 Limited: Do not attempt resuscitation (DNR) -DNR-LIMITED -Do Not Intubate/DNI  [id number]  Claudene Maximino LABOR, MD Inpatient   05/21/2023 1527 05/21/2023 1905 Full Code [id number]  Claudene Maximino LABOR, MD ED   12/10/2022 0137 12/11/2022 2212 Full Code [id number]  Shona Terry SAILOR, DO ED   05/04/2022 1946 05/05/2022 2257 Full Code [id number]  Shona Terry SAILOR, DO ED   11/20/2014 0201 11/23/2014 1833 Full Code [id number]  Eldonna Elspeth PARAS, MD ED      Advance Directive Documentation    Flowsheet Row Most Recent Value  Type of Advance Directive Healthcare Power of Attorney  Pre-existing out of facility DNR order (yellow form or pink MOST form) --  MOST Form in Place? --    Prognosis:  < 6 months  Discharge Planning: [facility] with hospice.   Care plan was discussed with  patient TRH son   Thank you for allowing the Palliative Medicine Team to assist in the care of this patient.  High MDM.      Greater than 50%  of this time was spent counseling and coordinating care related to the above assessment and plan.  Lonia Serve, MD  Please contact Palliative Medicine Team phone at [phone] for questions and concerns.       "

## 2024-08-03 NOTE — Plan of Care (Signed)

## 2024-08-03 NOTE — Progress Notes (Addendum)
 " PROGRESS NOTE    [name]  FMW:[id number] DOB: [birth year] DOA: 07/31/2024 PCP: Marston Junette HERO, MD   Brief Narrative:   89 y.o. female with medical history significant of Lewy body dementia, hypothyroidism, hypertension, CKD 3A, anemia, subarachnoid hemorrhage due to fall, chronic fatigue and recent admission for acute metabolic encephalopathy due to acute diverticulitis/UTI/acute on chronic anemia requiring IV antibiotics and blood transfusion was sent from SNF for elevated BUN and creatinine.  On presentation, creatinine was 2.87 (creatinine on discharge on 07/26/2024 was 1.22).  She was started on IV fluids.  Palliative care consulted for goals of care discussion.  Assessment & Plan:   AKI on CKD stage IIIa Acute metabolic acidosis - On presentation, creatinine was 2.87 (creatinine on discharge on 07/26/2024 was 1.22).  Possibly from poor oral intake and dehydration.  - Creatinine improving to 1.29 today.  Off bicarb drip.  Switch IV fluids to D5 due to slight hypernatremia.  Oral intake remains poor.  UA negative for UTI on presentation.  Hypokalemia - Resolved  Hypernatremia - Possibly from poor oral intake and dehydration.  IV fluid plan as above.  Leukocytosis - Questionable cause.  Monitor  Anemia of chronic disease - From chronic illnesses.  Hemoglobin stable.  Monitor intermittently  History of Lewy body dementia with behavioral disturbance Poor oral intake Failure to thrive Dysphagia Goals of care - Fall/delirium precautions.  Continue memantine  and Rexulti  along with sertraline  - Palliative care following.  Overall prognosis is guarded to poor.  For now, son wishes to continue current medical care.  Will benefit from hospice services at long-term care facility. -Diet as per SLP recommendations  History of subarachnoid hemorrhage - In the setting of fall in the past.  Continue fall precaution  Hypothyroidism - Continue levothyroxine   Hypertension -  Continue amlodipine  and isosorbide  mononitrate.  Blood pressure intermittently on the higher side.   DVT prophylaxis: SCDs Code Status: DNR Family Communication: Son on phone Disposition Plan: Status is: Inpatient Remains inpatient appropriate because: Of severity of illness    Consultants: Palliative care  Procedures: None  Antimicrobials: None   Subjective: Patient seen and examined at bedside.  Poor historian.  No fever, agitation, seizures reported. Objective: Vitals:   08/02/24 1023 08/02/24 1245 08/03/24 0500 08/03/24 0527  BP: (!) 143/68 (!) 95/53  (!) 142/46  Pulse:  68  65  Resp:  14  16  Temp:  98.7 F (37.1 C)    TempSrc:      SpO2:  96%  98%  Weight:   47.5 kg   Height:        Intake/Output Summary (Last 24 hours) at 08/03/2024 0742 Last data filed at 08/02/2024 1823 Gross per 24 hour  Intake 455.73 ml  Output 200 ml  Net 255.73 ml   Filed Weights   08/01/24 2058 08/02/24 0500 08/03/24 0500  Weight: 46.1 kg 44.1 kg 47.5 kg    Examination:  General: On room air.  No distress.  Chronically ill and deconditioned looking. ENT/neck: No thyromegaly.  JVD is not elevated  respiratory: Decreased breath sounds at bases bilaterally with some crackles; no wheezing  CVS: S1-S2 heard, rate controlled currently Abdominal: Soft, nontender, slightly distended; no organomegaly, bowel sounds are heard Extremities: Trace lower extremity edema; no cyanosis  CNS: Awake this morning; hardly answers any questions.  Extremely poor historian.  No focal neurologic deficit.  Able to move extremities Lymph: No obvious lymphadenopathy Skin: No obvious ecchymosis/lesions  psych: Currently not agitated.  Mostly flat affect  musculoskeletal: No obvious joint swelling/deformity     Data Reviewed: I have personally reviewed following labs and imaging studies  CBC: Recent Labs  Lab 07/31/24 2305 08/01/24 0631 08/02/24 0536 08/03/24 0544  WBC 7.8 9.3 10.3 14.6*   NEUTROABS 5.1  --  8.5* 12.5*  HGB 7.6* 9.4* 9.0* 8.0*  HCT 23.7* 27.7* 27.3* 25.0*  MCV 98.8 95.8 96.5 98.4  PLT 298 338 330 352   Basic Metabolic Panel: Recent Labs  Lab 07/31/24 2305 08/01/24 0631 08/02/24 0536 08/03/24 0544  NA 140 141 144 147*  K 2.9* 3.2* 3.0* 3.7  CL 111 111 111 115*  CO2 18* 14* 23 20*  GLUCOSE 91 86 93 97  BUN 66* 59* 40* 24*  CREATININE 2.87* 2.54* 1.48* 1.29*  CALCIUM  8.0* 8.3* 8.1* 7.9*  MG  --  2.1 1.8 2.0   GFR: Estimated Creatinine Clearance: 20.4 mL/min (A) (by C-G formula based on SCr of 1.29 mg/dL (H)). Liver Function Tests: Recent Labs  Lab 07/31/24 2305 08/02/24 0536  AST 13* 19  ALT 11 15  ALKPHOS 56 59  BILITOT 0.3 0.4  PROT 5.1* 5.2*  ALBUMIN 2.8* 2.7*   No results for input(s): LIPASE, AMYLASE in the last 168 hours. No results for input(s): AMMONIA in the last 168 hours. Coagulation Profile: No results for input(s): INR, PROTIME in the last 168 hours. Cardiac Enzymes: No results for input(s): CKTOTAL, CKMB, CKMBINDEX, TROPONINI in the last 168 hours. BNP (last 3 results) Recent Labs    06/28/24 1850 07/13/24 1430  PROBNP 454.0* 460.0*   HbA1C: No results for input(s): HGBA1C in the last 72 hours. CBG: No results for input(s): GLUCAP in the last 168 hours. Lipid Profile: No results for input(s): CHOL, HDL, LDLCALC, TRIG, CHOLHDL, LDLDIRECT in the last 72 hours. Thyroid  Function Tests: No results for input(s): TSH, T4TOTAL, FREET4, T3FREE, THYROIDAB in the last 72 hours. Anemia Panel: No results for input(s): VITAMINB12, FOLATE, FERRITIN, TIBC, IRON, RETICCTPCT in the last 72 hours. Sepsis Labs: Recent Labs  Lab 07/31/24 2357 08/01/24 0018 08/01/24 0146  LATICACIDVEN <0.3* <0.3* <0.3*    No results found for this or any previous visit (from the past 240 hours).       Radiology Studies: No results found.      Scheduled Meds:  amLODipine   10  mg Oral Daily   brexpiprazole   1 mg Oral QHS   ferrous sulfate   325 mg Oral Q breakfast   folic acid   1 mg Oral Daily   isosorbide  mononitrate  20 mg Oral BID   levothyroxine   175 mcg Oral Q0600   memantine   10 mg Oral BID   pantoprazole   40 mg Oral QHS   senna-docusate  2 tablet Oral QHS   sertraline   100 mg Oral BID   sodium chloride  flush  3 mL Intravenous Q12H   sodium chloride  flush  3 mL Intravenous Q12H   Continuous Infusions:  0.9 % NaCl with KCl 40 mEq / L 100 mL/hr at 08/03/24 9380          Sophie Mao, MD [facility] 08/03/2024, 7:42 AM   "

## 2024-08-03 NOTE — Plan of Care (Signed)

## 2024-08-04 DIAGNOSIS — N189 Chronic kidney disease, unspecified: Secondary | ICD-10-CM | POA: Diagnosis not present

## 2024-08-04 DIAGNOSIS — N179 Acute kidney failure, unspecified: Secondary | ICD-10-CM | POA: Diagnosis not present

## 2024-08-04 LAB — CBC WITH DIFFERENTIAL/PLATELET
Abs Immature Granulocytes: 0.07 K/uL (ref 0.00–0.07)
Basophils Absolute: 0 K/uL (ref 0.0–0.1)
Basophils Relative: 0 %
Eosinophils Absolute: 0.2 K/uL (ref 0.0–0.5)
Eosinophils Relative: 2 %
HCT: 24.1 % — ABNORMAL LOW (ref 36.0–46.0)
Hemoglobin: 7.9 g/dL — ABNORMAL LOW (ref 12.0–15.0)
Immature Granulocytes: 1 %
Lymphocytes Relative: 12 %
Lymphs Abs: 1.4 K/uL (ref 0.7–4.0)
MCH: 32.1 pg (ref 26.0–34.0)
MCHC: 32.8 g/dL (ref 30.0–36.0)
MCV: 98 fL (ref 80.0–100.0)
Monocytes Absolute: 0.6 K/uL (ref 0.1–1.0)
Monocytes Relative: 5 %
Neutro Abs: 9.5 K/uL — ABNORMAL HIGH (ref 1.7–7.7)
Neutrophils Relative %: 80 %
Platelets: 312 K/uL (ref 150–400)
RBC: 2.46 MIL/uL — ABNORMAL LOW (ref 3.87–5.11)
RDW: 15.8 % — ABNORMAL HIGH (ref 11.5–15.5)
WBC: 11.8 K/uL — ABNORMAL HIGH (ref 4.0–10.5)
nRBC: 0 % (ref 0.0–0.2)

## 2024-08-04 LAB — BASIC METABOLIC PANEL WITH GFR
Anion gap: 8 (ref 5–15)
BUN: 28 mg/dL — ABNORMAL HIGH (ref 8–23)
CO2: 23 mmol/L (ref 22–32)
Calcium: 7.6 mg/dL — ABNORMAL LOW (ref 8.9–10.3)
Chloride: 109 mmol/L (ref 98–111)
Creatinine, Ser: 1.11 mg/dL — ABNORMAL HIGH (ref 0.44–1.00)
GFR, Estimated: 46 mL/min — ABNORMAL LOW
Glucose, Bld: 92 mg/dL (ref 70–99)
Potassium: 3.2 mmol/L — ABNORMAL LOW (ref 3.5–5.1)
Sodium: 139 mmol/L (ref 135–145)

## 2024-08-04 LAB — MAGNESIUM: Magnesium: 1.6 mg/dL — ABNORMAL LOW (ref 1.7–2.4)

## 2024-08-04 MED ORDER — DEXTROSE 5 % IV SOLN: INTRAVENOUS | Status: DC

## 2024-08-04 MED ORDER — POTASSIUM CHLORIDE 10 MEQ/100ML IV SOLN
10.0000 meq | INTRAVENOUS | Status: AC
  Administered 2024-08-04 (×4): 10 meq via INTRAVENOUS
  Filled 2024-08-04 (×4): qty 100

## 2024-08-04 MED ORDER — MAGNESIUM SULFATE 2 GM/50ML IV SOLN
2.0000 g | Freq: Once | INTRAVENOUS | Status: AC
  Administered 2024-08-04: 2 g via INTRAVENOUS
  Filled 2024-08-04: qty 50

## 2024-08-04 NOTE — Progress Notes (Signed)
 "                                                                                                                                                                                                          Daily Progress Note   Patient Name: [name]       Date: 08/04/2024 DOB: 26-Jun-1929  Age: 89 y.o. MRN#: [id number] Attending Physician: Rojelio Nest, DO Primary Care Physician: Marston Junette HERO, MD Admit Date: 07/31/2024  Reason for Consultation/Follow-up: Establishing goals of care  Subjective:  Awake Minimal PO intake  Length of Stay: 3  Current Medications: Scheduled Meds:   amLODipine   10 mg Oral Daily   brexpiprazole   1 mg Oral QHS   feeding supplement  237 mL Oral BID BM   ferrous sulfate   325 mg Oral Q breakfast   folic acid   1 mg Oral Daily   isosorbide  mononitrate  20 mg Oral BID   levothyroxine   175 mcg Oral Q0600   memantine   10 mg Oral BID   pantoprazole   40 mg Oral QHS   senna-docusate  2 tablet Oral QHS   sertraline   100 mg Oral BID   sodium chloride  flush  3 mL Intravenous Q12H   sodium chloride  flush  3 mL Intravenous Q12H    Continuous Infusions:  potassium chloride  10 mEq (08/04/24 0950)    PRN Meds: acetaminophen  **OR** acetaminophen , ondansetron  **OR** ondansetron  (ZOFRAN ) IV, sodium chloride  flush  Physical Exam         Awake this am No distress Resting in bed Appears frail and with generalized weakness  Vital Signs: BP (!) 121/52 (BP Location: Left Arm)   Pulse (!) 54   Temp 98.4 F (36.9 C) (Axillary)   Resp 17   Ht 5' 2.99 (1.6 m)   Wt 54.4 kg   SpO2 97%   BMI 21.25 kg/m  SpO2: SpO2: 97 % O2 Device: O2 Device: Room Air O2 Flow Rate:    Intake/output summary:  Intake/Output Summary (Last 24 hours) at 08/04/2024 1044 Last data filed at 08/04/2024 0406 Gross per 24 hour  Intake 2491.83 ml  Output --  Net 2491.83 ml   LBM: Last BM Date : 08/03/24 Baseline Weight: Weight: 46.1 kg Most recent weight: Weight: 54.4 kg        Palliative Assessment/Data:      Patient Active Problem List   Diagnosis Date Noted   History of subarachnoid hemorrhage 08/01/2024   Sepsis (HCC) 07/14/2024   Protein-calorie malnutrition, severe 07/14/2024   Cystitis 07/13/2024   Unresponsive episode  07/13/2024   Diverticulitis 07/13/2024   Acute kidney injury superimposed on chronic kidney disease 06/29/2024   Pain due to onychomycosis of toenails of both feet 08/19/2023   Acute encephalopathy 05/21/2023   Dysarthria 05/21/2023   Hypokalemia 05/21/2023   Hypertensive urgency 05/21/2023   Lewy body dementia (HCC) 05/21/2023   Anxiety and depression 05/21/2023   Hypocalcemia 05/21/2023   GERD (gastroesophageal reflux disease) 05/21/2023   Fall 12/10/2022   Weakness 05/04/2022   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   CKD (chronic kidney disease) stage 3, GFR 30-59 ml/min (HCC) 03/31/2019   Chronic anemia 03/07/2015   Transaminitis 11/20/2014   Hyponatremia 10/09/2014   Lightheaded 08/30/2014   Right hip pain 07/25/2014   Chronic neck pain 06/06/2014   Memory loss 06/23/2013   Right shoulder pain 06/23/2013   Eczema 01/16/2013   Leg edema 08/23/2012   Chronic insomnia 08/09/2012   Renal insufficiency 03/31/2011   CYSTOCELE WITHOUT MENTION UTERINE PROLAPSE LAT 08/29/2010   Anemia of chronic disease 05/06/2010   LIVER FUNCTION TESTS, ABNORMAL, HX OF 01/14/2009   Hypothyroidism 04/04/2007   Adjustment disorder with mixed anxiety and depressed mood 04/04/2007   Essential hypertension 04/04/2007    Palliative Care Assessment & Plan   Patient Profile:    Assessment: 89 year old lady who has been a long-term resident at [facility] facility for the last year and a half, life-limiting illness of Lewy body dementia, also with past medical history of hypothyroidism hypertension stage III chronic kidney disease anemia subarachnoid hemorrhage due to fall, chronic fatigue Recent admission for acute metabolic encephalopathy deemed secondary to  acute diverticulitis urinary tract infection acute on chronic anemia at that time patient required IV antibiotics and blood transfusions Patient admitted at this time with acute kidney injury on top of stage III chronic kidney disease acute metabolic acidosis failure to thrive dysphagia diminished oral intake and ongoing decline in functional status and cognitive status Palliative consultation for goals of care discussions has been requested  Recommendations/Plan: DNR - comfort care Back to [facility] with hospice.    Goals of Care and Additional Recommendations: Limitations on Scope of Treatment: Full Comfort Care  Code Status:    Code Status Orders  (From admission, onward)           Start     Ordered   08/03/24 1417  Do not attempt resuscitation (DNR) - Comfort care  (Code Status)  Continuous       Question Answer Comment  If patient has no pulse and is not breathing Do Not Attempt Resuscitation   In Pre-Arrest Conditions (Patient Is Breathing and Has a Pulse) Provide comfort measures. Relieve any mechanical airway obstruction. Avoid transfer unless required for comfort.   Consent: Discussion documented in EHR or advanced directives reviewed      08/03/24 1416           Code Status History     Date Active Date Inactive Code Status Order ID Comments User Context   08/01/2024 0402 08/03/2024 1416 Limited: Do not attempt resuscitation (DNR) -DNR-LIMITED -Do Not Intubate/DNI  [id number]  Sundil, Subrina, MD ED   08/01/2024 0355 08/01/2024 0402 Limited: Do not attempt resuscitation (DNR) -DNR-LIMITED -Do Not Intubate/DNI  [id number]  Sundil, Subrina, MD ED   08/01/2024 0355 08/01/2024 0355 Do not attempt resuscitation (DNR) PRE-ARREST INTERVENTIONS DESIRED [id number]  Lee Kingfisher, MD ED   07/13/2024 2337 07/16/2024 2053 Do not attempt resuscitation (DNR) PRE-ARREST INTERVENTIONS DESIRED [id number]  Lorren Bradly POUR, MD ED   06/28/2024 2324 07/01/2024 1719  Limited: Do not  attempt resuscitation (DNR) -DNR-LIMITED -Do Not Intubate/DNI  [id number]  Patt Alm Macho, MD ED   05/21/2023 1905 05/25/2023 2047 Limited: Do not attempt resuscitation (DNR) -DNR-LIMITED -Do Not Intubate/DNI  [id number]  Claudene Maximino LABOR, MD Inpatient   05/21/2023 1527 05/21/2023 1905 Full Code [id number]  Claudene Maximino LABOR, MD ED   12/10/2022 0137 12/11/2022 2212 Full Code [id number]  Shona Terry SAILOR, DO ED   05/04/2022 1946 05/05/2022 2257 Full Code [id number]  Shona Terry SAILOR, DO ED   11/20/2014 0201 11/23/2014 1833 Full Code [id number]  Eldonna Elspeth PARAS, MD ED      Advance Directive Documentation    Flowsheet Row Most Recent Value  Type of Advance Directive Healthcare Power of Attorney  Pre-existing out of facility DNR order (yellow form or pink MOST form) --  MOST Form in Place? --    Prognosis:  < 6 months  Discharge Planning: [facility] with hospice.   Care plan was discussed with  patient  Thank you for allowing the Palliative Medicine Team to assist in the care of this patient.  mod MDM.      Greater than 50%  of this time was spent counseling and coordinating care related to the above assessment and plan.  Lonia Serve, MD  Please contact Palliative Medicine Team phone at [phone] for questions and concerns.       "

## 2024-08-04 NOTE — TOC Progression Note (Addendum)
 Transition of Care [facility]) - Progression Note    Patient Details  Name: [name] MRN: [id number] Date of Birth: [birth year]  [facility] CM/SW Contact  Alfonse JONELLE Rex, RN Phone Number: 08/04/2024, 4:02 PM  Clinical Narrative:   PT recommendation for short term rehab.-SNF. Teams chat sent to team to confirm dc plan, per PMT  no role of rehab PT, discussed with son at length, hospice ltc with [facility]. NCM confirmed with [facility] w/[facility] , facility has a collaboration with [facility]. NCM called to patient's son, [name], to review dc plans, no answer, vm left with NCM name and phone number for contact. FL2 updated  14:15pm Call received from patient's son, [name], NCM introduced self and reason for call, updated on dc plan-return to [facility] with [facility], [name] agreeable, no further questions/concerns.     Expected Discharge Plan: Assisted Living Barriers to Discharge: Continued Medical Work up               Expected Discharge Plan and Services In-house Referral: Clinical Social Work Discharge Planning Services: NA Post Acute Care Choice: Resumption of Svcs/PTA Provider Living arrangements for the past 2 months: Assisted Living Facility                 DME Arranged: N/A DME Agency: NA       HH Arranged: NA HH Agency: NA         Social Drivers of Health (SDOH) Interventions SDOH Screenings   Food Insecurity: No Food Insecurity (08/01/2024)  Housing: Low Risk (08/01/2024)  Transportation Needs: No Transportation Needs (08/01/2024)  Utilities: Not At Risk (08/01/2024)  Depression (PHQ2-9): Low Risk (06/05/2022)  Financial Resource Strain: Low Risk (06/05/2022)  Physical Activity: Inactive (06/05/2022)  Social Connections: Patient Unable To Answer (08/01/2024)  Stress: Stress Concern Present (06/05/2022)  Tobacco Use: Medium Risk (08/01/2024)    Readmission Risk Interventions    08/02/2024   11:24 AM 06/29/2024   11:26 AM   Readmission Risk Prevention Plan  Transportation Screening Complete Complete  PCP or Specialist Appt within 5-7 Days  Complete  Home Care Screening  Complete  Medication Review (RN CM)  Complete  Medication Review (RN Care Manager) Complete   PCP or Specialist appointment within 3-5 days of discharge Complete   HRI or Home Care Consult Complete   SW Recovery Care/Counseling Consult Complete   Palliative Care Screening Complete   Skilled Nursing Facility Not Applicable

## 2024-08-04 NOTE — NC FL2 (Signed)
 " [place]  MEDICAID FL2 LEVEL OF CARE FORM     IDENTIFICATION  Patient Name: [name] Birthdate: 08-Aug-1929 Sex: female Admission Date (Current Location): 07/31/2024  [facility] and [place] Number:  Producer, Television/film/video and Address:  [facility],  [address], [place] [address]      Provider Number: [id number]  Attending Physician Name and Address:  Rojelio Nest, DO  Relative Name and Phone Number:  [place], [place] [facility])  Son, Emergency Contact  [phone] (Mobile)    Current Level of Care: Hospital Recommended Level of Care: Memory Care [facility]) Prior Approval Number:    Date Approved/Denied:   PASRR Number: [id number]  Discharge Plan: Other (Comment) [facility])    Current Diagnoses: Patient Active Problem List   Diagnosis Date Noted   History of subarachnoid hemorrhage 08/01/2024   Sepsis (HCC) 07/14/2024   Protein-calorie malnutrition, severe 07/14/2024   Cystitis 07/13/2024   Unresponsive episode 07/13/2024   Diverticulitis 07/13/2024   Acute kidney injury superimposed on chronic kidney disease 06/29/2024   Pain due to onychomycosis of toenails of both feet 08/19/2023   Acute encephalopathy 05/21/2023   Dysarthria 05/21/2023   Hypokalemia 05/21/2023   Hypertensive urgency 05/21/2023   Lewy body dementia (HCC) 05/21/2023   Anxiety and depression 05/21/2023   Hypocalcemia 05/21/2023   GERD (gastroesophageal reflux disease) 05/21/2023   Fall 12/10/2022   Weakness 05/04/2022   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   CKD (chronic kidney disease) stage 3, GFR 30-59 ml/min (HCC) 03/31/2019   Chronic anemia 03/07/2015   Transaminitis 11/20/2014   Hyponatremia 10/09/2014   Lightheaded 08/30/2014   Right hip pain 07/25/2014   Chronic neck pain 06/06/2014   Memory loss 06/23/2013   Right shoulder pain 06/23/2013   Eczema 01/16/2013   Leg edema 08/23/2012   Chronic insomnia 08/09/2012   Renal insufficiency 03/31/2011    CYSTOCELE WITHOUT MENTION UTERINE PROLAPSE LAT 08/29/2010   Anemia of chronic disease 05/06/2010   LIVER FUNCTION TESTS, ABNORMAL, HX OF 01/14/2009   Hypothyroidism 04/04/2007   Adjustment disorder with mixed anxiety and depressed mood 04/04/2007   Essential hypertension 04/04/2007    Orientation RESPIRATION BLADDER Height & Weight          Incontinent, External catheter Weight: 54.4 kg Height:  5' 2.99 (160 cm)  BEHAVIORAL SYMPTOMS/MOOD NEUROLOGICAL BOWEL NUTRITION STATUS      Incontinent Diet (DYS 2)  AMBULATORY STATUS COMMUNICATION OF NEEDS Skin   Extensive Assist Verbally Other (Comment) (Stage 2 Sacrum Pressure injury w/Foam lift dressing; Traumatic arm injury w/foam lift dresssing)                       Personal Care Assistance Level of Assistance  Bathing, Feeding, Dressing Bathing Assistance: Maximum assistance Feeding assistance: Maximum assistance Dressing Assistance: Maximum assistance     Functional Limitations Info  Sight, Hearing, Speech Sight Info: Impaired Hearing Info: Impaired Speech Info: Adequate    SPECIAL CARE FACTORS FREQUENCY                       Contractures      Additional Factors Info  Code Status, Allergies, Psychotropic Code Status Info: DNR Allergies Info: Lactose Intolerance (Gi), Oxycodone Hcl Psychotropic Info: see MAR         Current Medications (08/04/2024):  This is the current hospital active medication list Current Facility-Administered Medications  Medication Dose Route Frequency Provider Last Rate Last Admin   acetaminophen  (TYLENOL )  tablet 650 mg  650 mg Oral Q6H PRN Sundil, Subrina, MD       Or   acetaminophen  (TYLENOL ) suppository 650 mg  650 mg Rectal Q6H PRN Sundil, Subrina, MD       amLODipine  (NORVASC ) tablet 10 mg  10 mg Oral Daily Sundil, Subrina, MD   10 mg at 08/02/24 1023   brexpiprazole  (REXULTI ) tablet 1 mg  1 mg Oral QHS Sundil, Subrina, MD       dextrose  5 % solution   Intravenous Continuous  Rojelio Nest, DO 75 mL/hr at 08/04/24 1500 New Bag at 08/04/24 1500   feeding supplement (ENSURE PLUS HIGH PROTEIN) liquid 237 mL  237 mL Oral BID BM Cheryle Page, MD       ferrous sulfate  tablet 325 mg  325 mg Oral Q breakfast Sundil, Subrina, MD   325 mg at 08/02/24 1023   folic acid  (FOLVITE ) tablet 1 mg  1 mg Oral Daily Sundil, Subrina, MD   1 mg at 08/02/24 1024   isosorbide  mononitrate (ISMO ) tablet 20 mg  20 mg Oral BID Sundil, Subrina, MD   20 mg at 08/02/24 1024   levothyroxine  (SYNTHROID ) tablet 175 mcg  175 mcg Oral Q0600 Sundil, Subrina, MD   175 mcg at 08/03/24 0619   memantine  (NAMENDA ) tablet 10 mg  10 mg Oral BID Sundil, Subrina, MD   10 mg at 08/02/24 2132   ondansetron  (ZOFRAN ) tablet 4 mg  4 mg Oral Q6H PRN Sundil, Subrina, MD       Or   ondansetron  (ZOFRAN ) injection 4 mg  4 mg Intravenous Q6H PRN Sundil, Subrina, MD       pantoprazole  (PROTONIX ) EC tablet 40 mg  40 mg Oral QHS Sundil, Subrina, MD   40 mg at 08/02/24 2132   senna-docusate (Senokot-S) tablet 2 tablet  2 tablet Oral QHS Sundil, Subrina, MD       sertraline  (ZOLOFT ) tablet 100 mg  100 mg Oral BID Sundil, Subrina, MD   100 mg at 08/02/24 2132   sodium chloride  flush (NS) 0.9 % injection 3 mL  3 mL Intravenous Q12H Sundil, Subrina, MD   3 mL at 08/04/24 1000   sodium chloride  flush (NS) 0.9 % injection 3 mL  3 mL Intravenous Q12H Sundil, Subrina, MD   3 mL at 08/04/24 1000   sodium chloride  flush (NS) 0.9 % injection 3 mL  3 mL Intravenous PRN Sundil, Subrina, MD         Discharge Medications: Please see discharge summary for a list of discharge medications.  Relevant Imaging Results:  Relevant Lab Results:   Additional Information SSN: [id number]  Alfonse JONELLE Rex, RN     "

## 2024-08-04 NOTE — Plan of Care (Signed)
  Problem: Clinical Measurements: Goal: Respiratory complications will improve Outcome: Progressing Goal: Cardiovascular complication will be avoided Outcome: Progressing   Problem: Nutrition: Goal: Adequate nutrition will be maintained Outcome: Progressing   Problem: Elimination: Goal: Will not experience complications related to bowel motility Outcome: Progressing   Problem: Skin Integrity: Goal: Risk for impaired skin integrity will decrease Outcome: Progressing

## 2024-08-04 NOTE — Plan of Care (Signed)
  Problem: Education: Goal: Knowledge of General Education information will improve Description: Including pain rating scale, medication(s)/side effects and non-pharmacologic comfort measures Outcome: Progressing   Problem: Clinical Measurements: Goal: Will remain free from infection Outcome: Progressing Goal: Diagnostic test results will improve Outcome: Progressing   Problem: Activity: Goal: Risk for activity intolerance will decrease Outcome: Progressing   Problem: Nutrition: Goal: Adequate nutrition will be maintained Outcome: Progressing   Problem: Safety: Goal: Ability to remain free from injury will improve Outcome: Progressing   Problem: Skin Integrity: Goal: Risk for impaired skin integrity will decrease Outcome: Progressing

## 2024-08-04 NOTE — Progress Notes (Signed)
 " PROGRESS NOTE    GISSELA [name]  FMW:[id number] DOB: May 27, 1929 DOA: 07/31/2024 PCP: Marston Junette HERO, MD     Brief Narrative:  [name] is a 89 y.o. female with medical history significant of Lewy body dementia, hypothyroidism, hypertension, CKD 3A, anemia, subarachnoid hemorrhage due to fall, chronic fatigue and recent admission for acute metabolic encephalopathy due to acute diverticulitis/UTI/acute on chronic anemia requiring IV antibiotics and blood transfusion was sent from SNF for elevated BUN and creatinine.  On presentation, creatinine was 2.87 (creatinine on discharge on 07/26/2024 was 1.22).  She was started on IV fluids.  Palliative care consulted for goals of care discussion.  New events last 24 hours / Subjective: She is alert to voice and attempts to answer questions, but is unintelligible.  Assessment & Plan:  Principal Problem:   Acute kidney injury superimposed on chronic kidney disease Active Problems:   Hypokalemia   Lewy body dementia (HCC)   Hypothyroidism   Essential hypertension   Chronic anemia   History of subarachnoid hemorrhage   AKI on CKD stage IIIa - Baseline creatinine 1.22.  Insetting of poor oral intake and dehydration - AKI resolved with IV fluid, however patient's oral intake remains poor, failure to thrive.  At high risk of readmission with same.  Poor prognosis overall  Hypokalemia - Replace  Hypomagnesia - Replace  Lewy body dementia with behavioral disturbance Poor oral intake, failure to thrive, dysphagia - Remains on dysphagia diet - Brexpiprazole , Namenda , Zoloft   History of subarachnoid hemorrhage - Insetting of fall in the past  Hypothyroidism - Synthroid   Hypertension - Norvasc , isosorbide    In agreement with assessment of the pressure ulcer as below:  Wound 07/14/24 0130 Pressure Injury Sacrum Stage 2 -  Partial thickness loss of dermis presenting as a shallow open injury with a red, pink wound bed without slough.  (Active)    DVT prophylaxis:  SCDs Start: 08/01/24 0403 Place TED hose Start: 08/01/24 0403  Code Status: DNR Family Communication: Son over the phone Disposition Plan: Long-term care with hospice Status is: Inpatient Remains inpatient appropriate because: Placement with.  Palliative care/hospice on discharge.  TOC looking into it    Antimicrobials:  Anti-infectives (From admission, onward)    None        Objective: Vitals:   08/03/24 1206 08/03/24 2110 08/04/24 0500 08/04/24 0605  BP: (!) 132/44 (!) 135/44  (!) 121/52  Pulse: 65 60  (!) 54  Resp: 18 19  17   Temp: 98.4 F (36.9 C) 97.9 F (36.6 C)  98.4 F (36.9 C)  TempSrc: Oral Axillary  Axillary  SpO2:  97%  97%  Weight:   54.4 kg   Height:        Intake/Output Summary (Last 24 hours) at 08/04/2024 1223 Last data filed at 08/04/2024 0406 Gross per 24 hour  Intake 2251.83 ml  Output --  Net 2251.83 ml   Filed Weights   08/02/24 0500 08/03/24 0500 08/04/24 0500  Weight: 44.1 kg 47.5 kg 54.4 kg    Examination:  General exam: Appears calm and comfortable  Respiratory system: Clear to auscultation. Respiratory effort normal.  Cardiovascular system: S1 & S2 heard, RRR. Gastrointestinal system: Abdomen is nondistended Central nervous system: Alert Extremities: Symmetric in appearance   Data Reviewed: I have personally reviewed following labs and imaging studies  CBC: Recent Labs  Lab 07/31/24 2305 08/01/24 0631 08/02/24 0536 08/03/24 0544 08/04/24 0511  WBC 7.8 9.3 10.3 14.6* 11.8*  NEUTROABS 5.1  --  8.5* 12.5* 9.5*  HGB 7.6* 9.4* 9.0* 8.0* 7.9*  HCT 23.7* 27.7* 27.3* 25.0* 24.1*  MCV 98.8 95.8 96.5 98.4 98.0  PLT 298 338 330 352 312   Basic Metabolic Panel: Recent Labs  Lab 07/31/24 2305 08/01/24 0631 08/02/24 0536 08/03/24 0544 08/04/24 0511  NA 140 141 144 147* 139  K 2.9* 3.2* 3.0* 3.7 3.2*  CL 111 111 111 115* 109  CO2 18* 14* 23 20* 23  GLUCOSE 91 86 93 97 92  BUN 66* 59*  40* 24* 28*  CREATININE 2.87* 2.54* 1.48* 1.29* 1.11*  CALCIUM  8.0* 8.3* 8.1* 7.9* 7.6*  MG  --  2.1 1.8 2.0 1.6*   GFR: Estimated Creatinine Clearance: 26.2 mL/min (A) (by C-G formula based on SCr of 1.11 mg/dL (H)). Liver Function Tests: Recent Labs  Lab 07/31/24 2305 08/02/24 0536  AST 13* 19  ALT 11 15  ALKPHOS 56 59  BILITOT 0.3 0.4  PROT 5.1* 5.2*  ALBUMIN 2.8* 2.7*   No results for input(s): LIPASE, AMYLASE in the last 168 hours. No results for input(s): AMMONIA in the last 168 hours. Coagulation Profile: No results for input(s): INR, PROTIME in the last 168 hours. Cardiac Enzymes: No results for input(s): CKTOTAL, CKMB, CKMBINDEX, TROPONINI in the last 168 hours. BNP (last 3 results) Recent Labs    06/28/24 1850 07/13/24 1430  PROBNP 454.0* 460.0*   HbA1C: No results for input(s): HGBA1C in the last 72 hours. CBG: No results for input(s): GLUCAP in the last 168 hours. Lipid Profile: No results for input(s): CHOL, HDL, LDLCALC, TRIG, CHOLHDL, LDLDIRECT in the last 72 hours. Thyroid  Function Tests: No results for input(s): TSH, T4TOTAL, FREET4, T3FREE, THYROIDAB in the last 72 hours. Anemia Panel: No results for input(s): VITAMINB12, FOLATE, FERRITIN, TIBC, IRON, RETICCTPCT in the last 72 hours. Sepsis Labs: Recent Labs  Lab 07/31/24 2357 08/01/24 0018 08/01/24 0146  LATICACIDVEN <0.3* <0.3* <0.3*    No results found for this or any previous visit (from the past 240 hours).    Radiology Studies: No results found.    Scheduled Meds:  amLODipine   10 mg Oral Daily   brexpiprazole   1 mg Oral QHS   feeding supplement  237 mL Oral BID BM   ferrous sulfate   325 mg Oral Q breakfast   folic acid   1 mg Oral Daily   isosorbide  mononitrate  20 mg Oral BID   levothyroxine   175 mcg Oral Q0600   memantine   10 mg Oral BID   pantoprazole   40 mg Oral QHS   senna-docusate  2 tablet Oral QHS   sertraline    100 mg Oral BID   sodium chloride  flush  3 mL Intravenous Q12H   sodium chloride  flush  3 mL Intravenous Q12H   Continuous Infusions:  potassium chloride  10 mEq (08/04/24 1143)     LOS: 3 days   Time spent: 25 minutes   Delon Hoe, DO [facility] 08/04/2024, 12:23 PM   Available via Epic secure chat 7am-7pm After these hours, please refer to coverage provider listed on [email or web address]  "

## 2024-08-04 NOTE — Progress Notes (Signed)
 Speech Language Pathology Treatment: Dysphagia  Patient Details Name: [name] MRN: [id number] DOB: March 06, 1929 Today's Date: 08/04/2024 Time: 8580-8547 SLP Time Calculation (min) (ACUTE ONLY): 33 min  Assessment / Plan / Recommendation Clinical Impression  Pt was seen with her son at bedside, who is concerned about limited PO intake today. Per nurse, pt has been offered meals today but has only accepted water. With SLP, pt took several bites of puree, intermittently clenching her teeth to refuse them, until she would not open her mouth to accept any more. Pt also took several small pieces of a graham cracker, with similar presentation and ultimately refusing trials. She masticated the cracker that she did take though. Coughing was noted only x1 when pt started drinking thin liquids more rapidly. Son says that this happens sometimes at her baseline when she drinks too quickly. He also shares that pt is typically on more solid foods at SNF, but her meats are chopped.   PLAN: Education reviewed about options including advancing diet to see if a more solid texture may be more appealing for pt. Suspect that this is more of a cognitively-based refusal as opposed to oral dysphagia given improving oral function compared to initial evaluation. Will attempt Dys 2 (finely chopped) diet and thin liquids with full assist during meals, although pt remains at high risk for inadequate oral intake.   HPI HPI: [name] is a 89 y.o. female who presented to [facility] hospital on 08/01/24 from SNF to evaluate for abnormal lab work showing elevated BUN and elevated creatinine. She was recently admitted two weeks prior to this admission for acute metabolic encephalopathy in setting of acute diverticulitis, UTI and acute on chronic anemia. While in ED, family requesting palliative consult to discuss further GOC. In ED, she was hypertensive, bradycardic, U A showing bacteria present however was normal, CBC showing no evidence of  leukocytosis. She was admitted for management of AKI on CKD stage IIIa and hypokalemia. She was kept NPO and SLP ordered to assess swallow. PMH: Lewy body dementia, hypothyroidism, HTN, CKD IIIa, anemia, SAH due to prior fall, chronic fatigue.      SLP Plan  Continue with current plan of care        Swallow Evaluation Recommendations   Recommendations: PO diet PO Diet Recommendation: Dysphagia 2 (Finely chopped);Thin liquids (Level 0) Liquid Administration via: Straw;Cup Medication Administration: Crushed with puree Supervision: Full assist for feeding Postural changes: Position pt fully upright for meals Oral care recommendations: Oral care BID (2x/day)     Recommendations                           Dysphagia, unspecified (R13.10)     Continue with current plan of care     Leita SAILOR., M.A. CCC-SLP Acute Rehabilitation Services Office: [phone]  Secure chat preferred   08/04/2024, 3:01 PM

## 2024-08-05 DIAGNOSIS — N189 Chronic kidney disease, unspecified: Secondary | ICD-10-CM | POA: Diagnosis not present

## 2024-08-05 DIAGNOSIS — N179 Acute kidney failure, unspecified: Secondary | ICD-10-CM | POA: Diagnosis not present

## 2024-08-05 LAB — BASIC METABOLIC PANEL WITH GFR
Anion gap: 11 (ref 5–15)
BUN: 22 mg/dL (ref 8–23)
CO2: 20 mmol/L — ABNORMAL LOW (ref 22–32)
Calcium: 7.6 mg/dL — ABNORMAL LOW (ref 8.9–10.3)
Chloride: 103 mmol/L (ref 98–111)
Creatinine, Ser: 1.08 mg/dL — ABNORMAL HIGH (ref 0.44–1.00)
GFR, Estimated: 48 mL/min — ABNORMAL LOW
Glucose, Bld: 88 mg/dL (ref 70–99)
Potassium: 3.3 mmol/L — ABNORMAL LOW (ref 3.5–5.1)
Sodium: 133 mmol/L — ABNORMAL LOW (ref 135–145)

## 2024-08-05 LAB — CBC
HCT: 24 % — ABNORMAL LOW (ref 36.0–46.0)
Hemoglobin: 7.8 g/dL — ABNORMAL LOW (ref 12.0–15.0)
MCH: 31.5 pg (ref 26.0–34.0)
MCHC: 32.5 g/dL (ref 30.0–36.0)
MCV: 96.8 fL (ref 80.0–100.0)
Platelets: 349 K/uL (ref 150–400)
RBC: 2.48 MIL/uL — ABNORMAL LOW (ref 3.87–5.11)
RDW: 15.4 % (ref 11.5–15.5)
WBC: 9.2 K/uL (ref 4.0–10.5)
nRBC: 0 % (ref 0.0–0.2)

## 2024-08-05 LAB — MAGNESIUM: Magnesium: 2.1 mg/dL (ref 1.7–2.4)

## 2024-08-05 MED ORDER — POTASSIUM CHLORIDE CRYS ER 20 MEQ PO TBCR: 40.0000 meq | EXTENDED_RELEASE_TABLET | Freq: Once | ORAL | Status: DC

## 2024-08-05 NOTE — TOC Transition Note (Signed)
 Transition of Care [facility]) - Discharge Note   Patient Details  Name: [name] MRN: [id number] Date of Birth: 03-15-1929  Transition of Care [facility]) CM/SW Contact:  Sonda Manuella Quill, RN Phone Number: 08/05/2024, 10:41 AM   Clinical Narrative:    D/C orders received; [place] from [facility] gave RM # [id number], call report # [phone]; [facility] for hospice, and [facility] for transport; pt's son Truly Stankiewicz notified and agreed to d/c plan; [facility] called for transport at 1045; spoke w/ operator # [phone]; no IP CM needs.   Final next level of care: Skilled Nursing Facility Barriers to Discharge: No Barriers Identified   Patient Goals and CMS Choice Patient states their goals for this hospitalization and ongoing recovery are:: To return to [facility].gov Compare Post Acute Care list provided to:: Patient Represenative (must comment) Choice offered to / list presented to : Adult Children [place] ownership interest in [facility].provided to:: Adult Children    Discharge Placement              Patient chooses bed at: [facility] Patient to be transferred to facility by: [facility] Name of family member notified: Litha Lamartina (son) [phone] Patient and family notified of of transfer: 08/05/24  Discharge Plan and Services Additional resources added to the After Visit Summary for   In-house Referral: Clinical Social Work Discharge Planning Services: NA Post Acute Care Choice: Resumption of Svcs/PTA Provider          DME Arranged: N/A DME Agency: NA       HH Arranged: NA HH Agency: NA        Social Drivers of Health (SDOH) Interventions SDOH Screenings   Food Insecurity: No Food Insecurity (08/01/2024)  Housing: Low Risk (08/01/2024)  Transportation Needs: No Transportation Needs (08/01/2024)  Utilities: Not At Risk (08/01/2024)  Depression (PHQ2-9): Low Risk (06/05/2022)  Financial Resource Strain: Low Risk (06/05/2022)  Physical Activity: Inactive  (06/05/2022)  Social Connections: Patient Unable To Answer (08/01/2024)  Stress: Stress Concern Present (06/05/2022)  Tobacco Use: Medium Risk (08/01/2024)     Readmission Risk Interventions    08/02/2024   11:24 AM 06/29/2024   11:26 AM  Readmission Risk Prevention Plan  Transportation Screening Complete Complete  PCP or Specialist Appt within 5-7 Days  Complete  Home Care Screening  Complete  Medication Review (RN CM)  Complete  Medication Review (RN Care Manager) Complete   PCP or Specialist appointment within 3-5 days of discharge Complete   HRI or Home Care Consult Complete   SW Recovery Care/Counseling Consult Complete   Palliative Care Screening Complete   Skilled Nursing Facility Not Applicable

## 2024-08-05 NOTE — Progress Notes (Signed)
 "                                                                                                                                                                                                          Daily Progress Note   Patient Name: [name]       Date: 08/05/2024 DOB: [birth year]  Age: 89 y.o. MRN#: [id number] Attending Physician: Rojelio Nest, DO Primary Care Physician: Marston Junette HERO, MD Admit Date: 07/31/2024  Reason for Consultation/Follow-up: Establishing goals of care  Subjective:  Seen briefly earlier today, now on way to facility, transport at bedside.   Length of Stay: 4  Current Medications: Scheduled Meds:   amLODipine   10 mg Oral Daily   brexpiprazole   1 mg Oral QHS   feeding supplement  237 mL Oral BID BM   ferrous sulfate   325 mg Oral Q breakfast   folic acid   1 mg Oral Daily   isosorbide  mononitrate  20 mg Oral BID   levothyroxine   175 mcg Oral Q0600   memantine   10 mg Oral BID   pantoprazole   40 mg Oral QHS   potassium chloride   40 mEq Oral Once   senna-docusate  2 tablet Oral QHS   sertraline   100 mg Oral BID   sodium chloride  flush  3 mL Intravenous Q12H   sodium chloride  flush  3 mL Intravenous Q12H    Continuous Infusions:    PRN Meds: acetaminophen  **OR** acetaminophen , ondansetron  **OR** ondansetron  (ZOFRAN ) IV, sodium chloride  flush  Physical Exam         Awake this am No distress Resting in bed Appears frail and with generalized weakness  Vital Signs: BP (!) 123/52 (BP Location: Left Arm)   Pulse (!) 53   Temp 97.8 F (36.6 C) (Oral)   Resp 18   Ht 5' 2.99 (1.6 m)   Wt 52.1 kg   SpO2 100%   BMI 20.35 kg/m  SpO2: SpO2: 100 % O2 Device: O2 Device: Room Air O2 Flow Rate:    Intake/output summary:  Intake/Output Summary (Last 24 hours) at 08/05/2024 1132 Last data filed at 08/04/2024 1705 Gross per 24 hour  Intake 548.49 ml  Output --  Net 548.49 ml   LBM: Last BM Date : 08/05/24 Baseline Weight: Weight: 46.1  kg Most recent weight: Weight: 52.1 kg       Palliative Assessment/Data:      Patient Active Problem List   Diagnosis Date Noted   History of subarachnoid hemorrhage 08/01/2024   Sepsis (HCC) 07/14/2024   Protein-calorie  malnutrition, severe 07/14/2024   Cystitis 07/13/2024   Unresponsive episode 07/13/2024   Diverticulitis 07/13/2024   Acute kidney injury superimposed on chronic kidney disease 06/29/2024   Pain due to onychomycosis of toenails of both feet 08/19/2023   Acute encephalopathy 05/21/2023   Dysarthria 05/21/2023   Hypokalemia 05/21/2023   Hypertensive urgency 05/21/2023   Lewy body dementia (HCC) 05/21/2023   Anxiety and depression 05/21/2023   Hypocalcemia 05/21/2023   GERD (gastroesophageal reflux disease) 05/21/2023   Fall 12/10/2022   Weakness 05/04/2022   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   CKD (chronic kidney disease) stage 3, GFR 30-59 ml/min (HCC) 03/31/2019   Chronic anemia 03/07/2015   Transaminitis 11/20/2014   Hyponatremia 10/09/2014   Lightheaded 08/30/2014   Right hip pain 07/25/2014   Chronic neck pain 06/06/2014   Memory loss 06/23/2013   Right shoulder pain 06/23/2013   Eczema 01/16/2013   Leg edema 08/23/2012   Chronic insomnia 08/09/2012   Renal insufficiency 03/31/2011   CYSTOCELE WITHOUT MENTION UTERINE PROLAPSE LAT 08/29/2010   Anemia of chronic disease 05/06/2010   LIVER FUNCTION TESTS, ABNORMAL, HX OF 01/14/2009   Hypothyroidism 04/04/2007   Adjustment disorder with mixed anxiety and depressed mood 04/04/2007   Essential hypertension 04/04/2007    Palliative Care Assessment & Plan   Patient Profile:    Assessment: 89 year old lady who has been a long-term resident at [facility] facility for the last year and a half, life-limiting illness of Lewy body dementia, also with past medical history of hypothyroidism hypertension stage III chronic kidney disease anemia subarachnoid hemorrhage due to fall, chronic fatigue Recent admission for  acute metabolic encephalopathy deemed secondary to acute diverticulitis urinary tract infection acute on chronic anemia at that time patient required IV antibiotics and blood transfusions Patient admitted at this time with acute kidney injury on top of stage III chronic kidney disease acute metabolic acidosis failure to thrive dysphagia diminished oral intake and ongoing decline in functional status and cognitive status Palliative consultation for goals of care discussions has been requested  Recommendations/Plan: DNR - comfort care Back to [facility] with hospice.  Chart reviewed, discharge summary reviewed, appreciate hospice liaison establishing contact. No new inpatient PMT specific recommendations.   Goals of Care and Additional Recommendations: Limitations on Scope of Treatment: Full Comfort Care  Code Status:    Code Status Orders  (From admission, onward)           Start     Ordered   08/03/24 1417  Do not attempt resuscitation (DNR) - Comfort care  (Code Status)  Continuous       Question Answer Comment  If patient has no pulse and is not breathing Do Not Attempt Resuscitation   In Pre-Arrest Conditions (Patient Is Breathing and Has a Pulse) Provide comfort measures. Relieve any mechanical airway obstruction. Avoid transfer unless required for comfort.   Consent: Discussion documented in EHR or advanced directives reviewed      08/03/24 1416           Code Status History     Date Active Date Inactive Code Status Order ID Comments User Context   08/01/2024 0402 08/03/2024 1416 Limited: Do not attempt resuscitation (DNR) -DNR-LIMITED -Do Not Intubate/DNI  [id number]  Sundil, Subrina, MD ED   08/01/2024 0355 08/01/2024 0402 Limited: Do not attempt resuscitation (DNR) -DNR-LIMITED -Do Not Intubate/DNI  [id number]  Sundil, Subrina, MD ED   08/01/2024 0355 08/01/2024 0355 Do not attempt resuscitation (DNR) PRE-ARREST INTERVENTIONS DESIRED [id number]  Sundil, Subrina, MD ED  07/13/2024 2337 07/16/2024 2053 Do not attempt resuscitation (DNR) PRE-ARREST INTERVENTIONS DESIRED [id number]  Lorren Bradly POUR, MD ED   06/28/2024 2324 07/01/2024 1719 Limited: Do not attempt resuscitation (DNR) -DNR-LIMITED -Do Not Intubate/DNI  [id number]  Patt Alm Macho, MD ED   05/21/2023 1905 05/25/2023 2047 Limited: Do not attempt resuscitation (DNR) -DNR-LIMITED -Do Not Intubate/DNI  [id number]  Claudene Maximino LABOR, MD Inpatient   05/21/2023 1527 05/21/2023 1905 Full Code [id number]  Claudene Maximino LABOR, MD ED   12/10/2022 0137 12/11/2022 2212 Full Code [id number]  Shona Terry SAILOR, DO ED   05/04/2022 1946 05/05/2022 2257 Full Code [id number]  Shona Terry SAILOR, DO ED   11/20/2014 0201 11/23/2014 1833 Full Code [id number]  Eldonna Elspeth PARAS, MD ED      Advance Directive Documentation    Flowsheet Row Most Recent Value  Type of Advance Directive Healthcare Power of Attorney  Pre-existing out of facility DNR order (yellow form or pink MOST form) --  MOST Form in Place? --    Prognosis:  < 6 months  Discharge Planning: [facility] with hospice.   Care plan was discussed with  IDT Thank you for allowing the Palliative Medicine Team to assist in the care of this patient.  low MDM.      Greater than 50%  of this time was spent counseling and coordinating care related to the above assessment and plan.  Lonia Serve, MD  Please contact Palliative Medicine Team phone at [phone] for questions and concerns.       "

## 2024-08-05 NOTE — Progress Notes (Signed)
 Report called to RN at [facility]. Aware [facility] is here for transport and patient is now a DNR.

## 2024-08-05 NOTE — Plan of Care (Signed)
" °  Problem: Education: Goal: Knowledge of General Education information will improve Description: Including pain rating scale, medication(s)/side effects and non-pharmacologic comfort measures Outcome: Progressing   Problem: Health Behavior/Discharge Planning: Goal: Ability to manage health-related needs will improve Outcome: Progressing   Problem: Clinical Measurements: Goal: Ability to maintain clinical measurements within normal limits will improve Outcome: Progressing Goal: Will remain free from infection Outcome: Progressing Goal: Diagnostic test results will improve Outcome: Progressing Goal: Respiratory complications will improve Outcome: Progressing Goal: Cardiovascular complication will be avoided Outcome: Progressing   Problem: Clinical Measurements: Goal: Will remain free from infection Outcome: Progressing   Problem: Clinical Measurements: Goal: Diagnostic test results will improve Outcome: Progressing   Problem: Elimination: Goal: Will not experience complications related to urinary retention Outcome: Progressing   Problem: Pain Managment: Goal: General experience of comfort will improve and/or be controlled Outcome: Progressing   Problem: Skin Integrity: Goal: Risk for impaired skin integrity will decrease Outcome: Progressing   Problem: Safety: Goal: Ability to remain free from injury will improve Outcome: Progressing   "

## 2024-08-05 NOTE — Progress Notes (Signed)
 [facility] Liaison Note   Received request from [place], [place] for hospice services at facility after discharge. Per her request, spoke with patient's son [name] to initiate education related to hospice philosophy, services, and team approach to care. Patient/family verbalized understanding of information given. Per discussion, the plan is for discharge to facility today by [facility]/EMS.   DME needs discussed. Patient has the following equipment in the home:hospital bed, wheelchair  Patient/family requests the following equipment for delivery: none   The address has been verified and is correct in the chart.    Please send signed and completed DNR home with patient/family. Please provide prescriptions at discharge as needed to ensure ongoing symptom management.   [facility] information and contact numbers given to: [name]. Above information shared with [place], ICM.   Please call with any questions or concerns. Thank you for the opportunity to participate in this patient's care.   Nat Babe, BSN, RN [employer] [phone]

## 2024-08-05 NOTE — Discharge Summary (Signed)
 Physician Discharge Summary  [name] FMW:[id number] DOB: [birth year] DOA: 07/31/2024  PCP: Marston Junette HERO, MD  Admit date: 07/31/2024 Discharge date: 08/05/2024  Admitted From: LTC Disposition:  LTC with hospice   Discharge Condition: Poor prognosis  CODE STATUS: DNR  Diet recommendation: Dysphagia   Brief/Interim Summary: [name] is a 89 y.o. female with medical history significant of Lewy body dementia, hypothyroidism, hypertension, CKD 3A, anemia, subarachnoid hemorrhage due to fall, chronic fatigue and recent admission for acute metabolic encephalopathy due to acute diverticulitis/UTI/acute on chronic anemia requiring IV antibiotics and blood transfusion was sent from SNF for elevated BUN and creatinine.  On presentation, creatinine was 2.87 (creatinine on discharge on 07/26/2024 was 1.22).  She was started on IV fluids.  Palliative care consulted for goals of care discussion. Family ultimately decided to take patient back to LTC facility with hospice. Expected prognosis < 6 months.   Discharge Diagnoses:   Principal Problem:   Acute kidney injury superimposed on chronic kidney disease Active Problems:   Hypokalemia   Lewy body dementia (HCC)   Hypothyroidism   Essential hypertension   Chronic anemia   History of subarachnoid hemorrhage    AKI on CKD stage IIIa - Baseline creatinine 1.22.  Insetting of poor oral intake and dehydration - AKI resolved with IV fluid, however patient's oral intake remains poor, failure to thrive.  At high risk of readmission with same.  Poor prognosis overall   Hypokalemia - Replaced   Hypomagnesia - Replaced   Lewy body dementia with behavioral disturbance Poor oral intake, failure to thrive, dysphagia - Remains on dysphagia diet - Brexpiprazole , Namenda , Zoloft    History of subarachnoid hemorrhage - Insetting of fall in the past   Hypothyroidism - Synthroid    Hypertension - Norvasc , isosorbide      In agreement with  assessment of the pressure ulcer as below:  Wound 07/14/24 0130 Pressure Injury Sacrum Stage 2 -  Partial thickness loss of dermis presenting as a shallow open injury with a red, pink wound bed without slough. (Active)       Discharge Instructions  Discharge Instructions     No wound care   Complete by: As directed       Allergies as of 08/05/2024       Reactions   Lactose Intolerance (gi) Other (See Comments)   Reaction not stated on MAR   Oxycodone Hcl Itching        Medication List     TAKE these medications    amLODipine  10 MG tablet Commonly known as: NORVASC  TAKE 1 TABLET BY MOUTH DAILY   ferrous sulfate  325 (65 FE) MG tablet Take 1 tablet (325 mg total) by mouth daily with breakfast.   folic acid  1 MG tablet Commonly known as: FOLVITE  Take 1 tablet (1 mg total) by mouth daily.   isosorbide  mononitrate 10 MG tablet Commonly known as: ISMO  Take 20 mg by mouth 2 (two) times daily. Hold for SBP<110   levothyroxine  175 MCG tablet Commonly known as: SYNTHROID  Take 175 mcg by mouth daily before breakfast.   memantine  10 MG tablet Commonly known as: NAMENDA  TAKE 1 TABLET BY MOUTH IN THE  MORNING AND AT BEDTIME What changed: See the new instructions.   multivitamin tablet Take 1 tablet by mouth See admin instructions. Take 1 tablet by mouth once a day with food What changed:  when to take this additional instructions   ondansetron  4 MG tablet Commonly known as: ZOFRAN  Take by mouth 2 (  two) times daily.   pantoprazole  40 MG tablet Commonly known as: PROTONIX  Take 40 mg by mouth at bedtime.   PreserVision AREDS Tabs Take 1 tablet by mouth daily.   Rexulti  0.5 MG Tabs Generic drug: Brexpiprazole  TAKE 1 TABLET BY MOUTH AT NIGHT   Rexulti  1 MG Tabs tablet Generic drug: brexpiprazole  Take 1 mg by mouth at bedtime.   senna-docusate 8.6-50 MG tablet Commonly known as: Senokot-S Take 2 tablets by mouth at bedtime.   sertraline  100 MG  tablet Commonly known as: Zoloft  Take 1 tablet (100 mg total) by mouth in the morning and at bedtime.   TYLENOL  500 MG tablet Generic drug: acetaminophen  Take 500 mg by mouth in the morning and at bedtime.   Vitamin D  (Ergocalciferol ) 1.25 MG (50000 UNIT) Caps capsule Commonly known as: DRISDOL Take 50,000 Units by mouth every 7 (seven) days. Takes on Fridays        Allergies[1]   Procedures/Studies: CT Head Wo Contrast Result Date: 07/13/2024 CLINICAL DATA:  Altered mental status.  Found unconscious. EXAM: CT HEAD WITHOUT CONTRAST CT CERVICAL SPINE WITHOUT CONTRAST TECHNIQUE: Multidetector CT imaging of the head and cervical spine was performed following the standard protocol without intravenous contrast. Multiplanar CT image reconstructions of the cervical spine were also generated. RADIATION DOSE REDUCTION: This exam was performed according to the departmental dose-optimization program which includes automated exposure control, adjustment of the mA and/or kV according to patient size and/or use of iterative reconstruction technique. COMPARISON:  CT head/cervical spine 01/13/2024 FINDINGS: CT HEAD FINDINGS Brain: Ventricles, cisterns and other CSF spaces are within normal with mild age related atrophic change present. There is mild chronic ischemic microvascular disease. No mass, mass effect, shift of midline structures or acute hemorrhage. No evidence of acute infarction. Vascular: No hyperdense vessel or unexpected calcification. Skull: Normal. Negative for fracture or focal lesion. Sinuses/Orbits: No acute finding. Other: None. CT CERVICAL SPINE FINDINGS Alignment: Normal. Skull base and vertebrae: Vertebral body heights are maintained. There is mild to moderate spondylosis of the cervical spine to include uncovertebral joint spurring and facet arthropathy. Anterior fusion hardware at the C4-5 level is intact and unchanged. Anterior fusion hardware at the C6-7 level is unchanged and  demonstrates continued evidence fracture of the right C7 vertebral body screw. No acute fracture. Moderate bilateral neural foraminal narrowing at the C3-4 level. Moderate right-sided neural foraminal narrowing at the C5-6 level Soft tissues and spinal canal: No prevertebral fluid or swelling. No visible canal hematoma. Disc levels: Interbody fusion at the C4-5 and C6-7 levels as the remaining disc spaces are unremarkable. Upper chest: No acute findings. Other: None. IMPRESSION: 1. No acute intracranial findings. 2. Age related atrophic change and chronic ischemic microvascular disease. 3. No acute cervical spine injury. 4. Moderate spondylosis of the cervical spine with multilevel neural foraminal narrowing as described. 5. Anterior fusion hardware at the C4-5 and C6-7 levels. Chronic stable fracture of the right C7 screw. Electronically Signed   By: Toribio Agreste M.D.   On: 07/13/2024 16:40   CT Cervical Spine Wo Contrast Result Date: 07/13/2024 CLINICAL DATA:  Altered mental status.  Found unconscious. EXAM: CT HEAD WITHOUT CONTRAST CT CERVICAL SPINE WITHOUT CONTRAST TECHNIQUE: Multidetector CT imaging of the head and cervical spine was performed following the standard protocol without intravenous contrast. Multiplanar CT image reconstructions of the cervical spine were also generated. RADIATION DOSE REDUCTION: This exam was performed according to the departmental dose-optimization program which includes automated exposure control, adjustment of the mA and/or  kV according to patient size and/or use of iterative reconstruction technique. COMPARISON:  CT head/cervical spine 01/13/2024 FINDINGS: CT HEAD FINDINGS Brain: Ventricles, cisterns and other CSF spaces are within normal with mild age related atrophic change present. There is mild chronic ischemic microvascular disease. No mass, mass effect, shift of midline structures or acute hemorrhage. No evidence of acute infarction. Vascular: No hyperdense vessel or  unexpected calcification. Skull: Normal. Negative for fracture or focal lesion. Sinuses/Orbits: No acute finding. Other: None. CT CERVICAL SPINE FINDINGS Alignment: Normal. Skull base and vertebrae: Vertebral body heights are maintained. There is mild to moderate spondylosis of the cervical spine to include uncovertebral joint spurring and facet arthropathy. Anterior fusion hardware at the C4-5 level is intact and unchanged. Anterior fusion hardware at the C6-7 level is unchanged and demonstrates continued evidence fracture of the right C7 vertebral body screw. No acute fracture. Moderate bilateral neural foraminal narrowing at the C3-4 level. Moderate right-sided neural foraminal narrowing at the C5-6 level Soft tissues and spinal canal: No prevertebral fluid or swelling. No visible canal hematoma. Disc levels: Interbody fusion at the C4-5 and C6-7 levels as the remaining disc spaces are unremarkable. Upper chest: No acute findings. Other: None. IMPRESSION: 1. No acute intracranial findings. 2. Age related atrophic change and chronic ischemic microvascular disease. 3. No acute cervical spine injury. 4. Moderate spondylosis of the cervical spine with multilevel neural foraminal narrowing as described. 5. Anterior fusion hardware at the C4-5 and C6-7 levels. Chronic stable fracture of the right C7 screw. Electronically Signed   By: Toribio Agreste M.D.   On: 07/13/2024 16:40   CT CHEST ABDOMEN PELVIS WO CONTRAST Result Date: 07/13/2024 CLINICAL DATA:  Sepsis, altered mental status. EXAM: CT CHEST, ABDOMEN AND PELVIS WITHOUT CONTRAST TECHNIQUE: Multidetector CT imaging of the chest, abdomen and pelvis was performed following the standard protocol without IV contrast. RADIATION DOSE REDUCTION: This exam was performed according to the departmental dose-optimization program which includes automated exposure control, adjustment of the mA and/or kV according to patient size and/or use of iterative reconstruction  technique. COMPARISON:  June 28, 2024, Dec 09, 2022 FINDINGS: Of note, the lack of intravenous contrast limits evaluation of the solid organ parenchyma and vascularity. CT CHEST FINDINGS Cardiovascular: Mild cardiomegaly. Trace pericardial effusion. Dense multi-vessel coronary atherosclerosis.No aortic aneurysm. Diffuse calcified aortic atherosclerosis. Mediastinum/Nodes: No mediastinal mass.No mediastinal, hilar, or axillary lymphadenopathy. Lungs/Pleura: The midline trachea and bronchi are patent. Biapical pleuroparenchymal scarring. No focal airspace consolidation, pleural effusion, or pneumothorax. CT ABDOMEN PELVIS FINDINGS Hepatobiliary: No mass.Cholecystectomy. Moderate dilation of the intrahepatic and extrahepatic bile ducts, likely related to the prior cholecystectomy. Pancreas: No mass or main ductal dilation. No peripancreatic inflammation or fluid collection. Spleen: Normal size. No mass. Adrenals/Urinary Tract: No adrenal masses. No renal mass. No hydronephrosis or nephrolithiasis. The urinary bladder is distended without focal abnormality. Stomach/Bowel: The stomach is decompressed without focal abnormality. Large bilobed periampullary duodenum diverticulum measuring 4.4 cm. No small bowel wall thickening or inflammation. No small bowel obstruction.Normal appendix. Total colonic diverticulosis. Subtle inflammatory stranding about the distal descending and sigmoid colon. No peridiverticular abscess. Vascular/Lymphatic: No aortic aneurysm. Diffuse aortoiliac atherosclerosis. No intraabdominal or pelvic lymphadenopathy. Reproductive: Hysterectomy. No concerning adnexal mass. No free pelvic fluid. Other: No pneumoperitoneum or ascites. Musculoskeletal: No acute fracture or destructive lesion. Osteopenia. Multilevel degenerative disc disease of the spine. Thoracic DISH. Anterior cervical fusion hardware. IMPRESSION: 1. Mild cardiomegaly with trace pericardial effusion. No pneumonia, pulmonary edema, or  pleural effusion. 2. Total colonic diverticulosis. Subtle  inflammatory stranding about the distal descending and sigmoid colon, which may reflect changes of an infectious or inflammatory colitis or acute diverticulitis. No peridiverticular abscess or pneumoperitoneum. Aortic Atherosclerosis (ICD10-I70.0). Electronically Signed   By: Rogelia Myers M.D.   On: 07/13/2024 16:37   DG Tibia/Fibula Left Result Date: 07/13/2024 CLINICAL DATA:  Provided history of fall. EXAM: LEFT TIBIA AND FIBULA - 2 VIEW COMPARISON:  Knee radiograph 08/21/2023 FINDINGS: Plate and screw fixation of the proximal tibia. The hardware is intact. No periprosthetic lucency. No acute fracture of the lower leg. No knee or ankle dislocation. Peripheral vascular calcifications are seen. Mild soft tissue edema. IMPRESSION: 1. No acute fracture of the left lower leg. 2. Plate and screw fixation of the proximal tibia. Hardware is intact. Electronically Signed   By: Andrea Gasman M.D.   On: 07/13/2024 15:32   DG Pelvis 1-2 Views Result Date: 07/13/2024 CLINICAL DATA:  Provided history of fall. Dementia patient with altered mental status. EXAM: PELVIS - 1-2 VIEW COMPARISON:  01/13/2024 FINDINGS: The patient is rotated limiting assessment. Allowing for this, no evidence of acute fracture. No hip dislocation. The bones are subjectively under mineralized. Degenerative changes of the pubic symphysis and sacroiliac joints without diastasis. IMPRESSION: 1. No evidence of acute fracture or dislocation. 2. Degenerative changes of the pubic symphysis and sacroiliac joints. Electronically Signed   By: Andrea Gasman M.D.   On: 07/13/2024 15:30   DG Chest Port 1 View Result Date: 07/13/2024 CLINICAL DATA:  Provided history of fall. Dementia patient with altered mental status. EXAM: PORTABLE CHEST 1 VIEW COMPARISON:  Radiograph 06/28/2024 FINDINGS: The cardiomediastinal contours are normal. Aortic atherosclerosis. Pulmonary vasculature is normal. No  consolidation, pleural effusion, or pneumothorax. Chronic calcified granuloma in the right lung. No acute osseous abnormalities are seen. IMPRESSION: No acute chest findings. Electronically Signed   By: Andrea Gasman M.D.   On: 07/13/2024 15:29      Discharge Exam: Vitals:   08/04/24 2101 08/05/24 0435  BP: 133/73 (!) 123/52  Pulse: (!) 56 (!) 53  Resp: 18 18  Temp: 98.1 F (36.7 C) 97.8 F (36.6 C)  SpO2: 98% 100%    General: Pt is alert, awake, not in acute distress Cardiovascular: RR, brady rate  Respiratory: CTA bilaterally, no wheezing, no rhonchi, no respiratory distress, on room air  Abdominal: Soft, NT, ND Extremities: no edema, no cyanosis    The results of significant diagnostics from this hospitalization (including imaging, microbiology, ancillary and laboratory) are listed below for reference.     Microbiology: No results found for this or any previous visit (from the past 240 hours).   Labs: BNP (last 3 results) No results for input(s): BNP in the last 8760 hours. Basic Metabolic Panel: Recent Labs  Lab 08/01/24 0631 08/02/24 0536 08/03/24 0544 08/04/24 0511 08/05/24 0611  NA 141 144 147* 139 133*  K 3.2* 3.0* 3.7 3.2* 3.3*  CL 111 111 115* 109 103  CO2 14* 23 20* 23 20*  GLUCOSE 86 93 97 92 88  BUN 59* 40* 24* 28* 22  CREATININE 2.54* 1.48* 1.29* 1.11* 1.08*  CALCIUM  8.3* 8.1* 7.9* 7.6* 7.6*  MG 2.1 1.8 2.0 1.6* 2.1   Liver Function Tests: Recent Labs  Lab 07/31/24 2305 08/02/24 0536  AST 13* 19  ALT 11 15  ALKPHOS 56 59  BILITOT 0.3 0.4  PROT 5.1* 5.2*  ALBUMIN 2.8* 2.7*   No results for input(s): LIPASE, AMYLASE in the last 168 hours. No results for  input(s): AMMONIA in the last 168 hours. CBC: Recent Labs  Lab 07/31/24 2305 08/01/24 0631 08/02/24 0536 08/03/24 0544 08/04/24 0511 08/05/24 0611  WBC 7.8 9.3 10.3 14.6* 11.8* 9.2  NEUTROABS 5.1  --  8.5* 12.5* 9.5*  --   HGB 7.6* 9.4* 9.0* 8.0* 7.9* 7.8*  HCT 23.7*  27.7* 27.3* 25.0* 24.1* 24.0*  MCV 98.8 95.8 96.5 98.4 98.0 96.8  PLT 298 338 330 352 312 349   Cardiac Enzymes: No results for input(s): CKTOTAL, CKMB, CKMBINDEX, TROPONINI in the last 168 hours. BNP: Invalid input(s): POCBNP CBG: No results for input(s): GLUCAP in the last 168 hours. D-Dimer No results for input(s): DDIMER in the last 72 hours. Hgb A1c No results for input(s): HGBA1C in the last 72 hours. Lipid Profile No results for input(s): CHOL, HDL, LDLCALC, TRIG, CHOLHDL, LDLDIRECT in the last 72 hours. Thyroid  function studies No results for input(s): TSH, T4TOTAL, T3FREE, THYROIDAB in the last 72 hours.  Invalid input(s): FREET3 Anemia work up No results for input(s): VITAMINB12, FOLATE, FERRITIN, TIBC, IRON, RETICCTPCT in the last 72 hours. Urinalysis    Component Value Date/Time   COLORURINE YELLOW 08/01/2024 0242   APPEARANCEUR HAZY (A) 08/01/2024 0242   LABSPEC 1.015 08/01/2024 0242   PHURINE 5.0 08/01/2024 0242   GLUCOSEU NEGATIVE 08/01/2024 0242   GLUCOSEU NEGATIVE 07/08/2021 1146   HGBUR NEGATIVE 08/01/2024 0242   BILIRUBINUR NEGATIVE 08/01/2024 0242   BILIRUBINUR neg 09/22/2022 1648   KETONESUR NEGATIVE 08/01/2024 0242   PROTEINUR 30 (A) 08/01/2024 0242   UROBILINOGEN 0.2 09/22/2022 1648   UROBILINOGEN 0.2 07/08/2021 1146   NITRITE NEGATIVE 08/01/2024 0242   LEUKOCYTESUR NEGATIVE 08/01/2024 0242   Sepsis Labs Recent Labs  Lab 08/02/24 0536 08/03/24 0544 08/04/24 0511 08/05/24 0611  WBC 10.3 14.6* 11.8* 9.2   Microbiology No results found for this or any previous visit (from the past 240 hours).   Patient was seen and examined on the day of discharge and was found to be in stable condition. Time coordinating discharge: 35 minutes including assessment and coordination of care, as well as examination of the patient.   SIGNED:  Delon Hoe, DO [facility] 08/05/2024, 9:30 AM        [1]  Allergies Allergen Reactions   Lactose Intolerance (Gi) Other (See Comments)    Reaction not stated on MAR   Oxycodone Hcl Itching

## 2024-08-05 NOTE — TOC Progression Note (Signed)
 Transition of Care [facility]) - Progression Note    Patient Details  Name: [name] MRN: [id number] Date of Birth: 1928-11-16  Transition of [facility]) CM/SW Contact  Sonda Manuella Quill, RN Phone Number: 08/05/2024, 9:50 AM  Clinical Narrative:    D/C orders received; LVM for [place] at [facility] to see if pt can return today; awaiting return call.   Expected Discharge Plan: Assisted Living Barriers to Discharge: Continued Medical Work up               Expected Discharge Plan and Services In-house Referral: Clinical Social Work Discharge Planning Services: NA Post Acute Care Choice: Resumption of Svcs/PTA Provider Living arrangements for the past 2 months: Assisted Living Facility Expected Discharge Date: 08/05/24               DME Arranged: N/A DME Agency: NA       HH Arranged: NA HH Agency: NA         Social Drivers of Health (SDOH) Interventions SDOH Screenings   Food Insecurity: No Food Insecurity (08/01/2024)  Housing: Low Risk (08/01/2024)  Transportation Needs: No Transportation Needs (08/01/2024)  Utilities: Not At Risk (08/01/2024)  Depression (PHQ2-9): Low Risk (06/05/2022)  Financial Resource Strain: Low Risk (06/05/2022)  Physical Activity: Inactive (06/05/2022)  Social Connections: Patient Unable To Answer (08/01/2024)  Stress: Stress Concern Present (06/05/2022)  Tobacco Use: Medium Risk (08/01/2024)    Readmission Risk Interventions    08/02/2024   11:24 AM 06/29/2024   11:26 AM  Readmission Risk Prevention Plan  Transportation Screening Complete Complete  PCP or Specialist Appt within 5-7 Days  Complete  Home Care Screening  Complete  Medication Review (RN CM)  Complete  Medication Review (RN Care Manager) Complete   PCP or Specialist appointment within 3-5 days of discharge Complete   HRI or Home Care Consult Complete   SW Recovery Care/Counseling Consult Complete   Palliative Care Screening Complete   Skilled Nursing  Facility Not Applicable
